# Patient Record
Sex: Female | Born: 1960
Health system: Southern US, Community
[De-identification: ages and names within clinical notes are randomized; demographics above are authoritative.]

## PROBLEM LIST (undated history)

## (undated) DIAGNOSIS — I5022 Chronic systolic (congestive) heart failure: Secondary | ICD-10-CM

## (undated) DIAGNOSIS — I472 Ventricular tachycardia, unspecified: Secondary | ICD-10-CM

## (undated) DIAGNOSIS — E119 Type 2 diabetes mellitus without complications: Secondary | ICD-10-CM

## (undated) DIAGNOSIS — I513 Intracardiac thrombosis, not elsewhere classified: Secondary | ICD-10-CM

## (undated) DIAGNOSIS — J449 Chronic obstructive pulmonary disease, unspecified: Secondary | ICD-10-CM

## (undated) DIAGNOSIS — Z91199 Patient's noncompliance with other medical treatment and regimen due to unspecified reason: Secondary | ICD-10-CM

## (undated) DIAGNOSIS — I272 Pulmonary hypertension, unspecified: Secondary | ICD-10-CM

## (undated) DIAGNOSIS — I2510 Atherosclerotic heart disease of native coronary artery without angina pectoris: Secondary | ICD-10-CM

## (undated) DIAGNOSIS — Z95810 Presence of automatic (implantable) cardiac defibrillator: Secondary | ICD-10-CM

## (undated) DIAGNOSIS — G4730 Sleep apnea, unspecified: Secondary | ICD-10-CM

## (undated) DIAGNOSIS — N182 Chronic kidney disease, stage 2 (mild): Secondary | ICD-10-CM

## (undated) DIAGNOSIS — R110 Nausea: Secondary | ICD-10-CM

## (undated) DIAGNOSIS — Z9119 Patient's noncompliance with other medical treatment and regimen: Secondary | ICD-10-CM

## (undated) DIAGNOSIS — F419 Anxiety disorder, unspecified: Secondary | ICD-10-CM

## (undated) DIAGNOSIS — I219 Acute myocardial infarction, unspecified: Secondary | ICD-10-CM

## (undated) DIAGNOSIS — I749 Embolism and thrombosis of unspecified artery: Secondary | ICD-10-CM

## (undated) DIAGNOSIS — Z720 Tobacco use: Secondary | ICD-10-CM

## (undated) DIAGNOSIS — I82409 Acute embolism and thrombosis of unspecified deep veins of unspecified lower extremity: Secondary | ICD-10-CM

## (undated) DIAGNOSIS — K219 Gastro-esophageal reflux disease without esophagitis: Secondary | ICD-10-CM

## (undated) DIAGNOSIS — E785 Hyperlipidemia, unspecified: Secondary | ICD-10-CM

## (undated) DIAGNOSIS — F1010 Alcohol abuse, uncomplicated: Secondary | ICD-10-CM

## (undated) DIAGNOSIS — R42 Dizziness and giddiness: Secondary | ICD-10-CM

## (undated) DIAGNOSIS — J9600 Acute respiratory failure, unspecified whether with hypoxia or hypercapnia: Secondary | ICD-10-CM

## (undated) HISTORY — DX: Atherosclerotic heart disease of native coronary artery without angina pectoris: I25.10

## (undated) HISTORY — DX: Alcohol abuse, uncomplicated: F10.10

## (undated) HISTORY — DX: Presence of automatic (implantable) cardiac defibrillator: Z95.810

## (undated) HISTORY — DX: Patient's noncompliance with other medical treatment and regimen: Z91.19

## (undated) HISTORY — DX: Dizziness and giddiness: R42

## (undated) HISTORY — PX: INSERT / REPLACE / REMOVE PACEMAKER: SUR710

## (undated) HISTORY — PX: DIAGNOSTIC LAPAROSCOPY: SUR761

## (undated) HISTORY — PX: CARDIAC CATHETERIZATION: SHX172

## (undated) HISTORY — DX: Type 2 diabetes mellitus without complications: E11.9

## (undated) HISTORY — PX: TUBAL LIGATION: SHX77

## (undated) HISTORY — DX: Patient's noncompliance with other medical treatment and regimen due to unspecified reason: Z91.199

## (undated) HISTORY — DX: Embolism and thrombosis of unspecified artery: I74.9

## (undated) HISTORY — DX: Ventricular tachycardia, unspecified: I47.20

## (undated) HISTORY — DX: Acute embolism and thrombosis of unspecified deep veins of unspecified lower extremity: I82.409

## (undated) HISTORY — DX: Hyperlipidemia, unspecified: E78.5

## (undated) HISTORY — DX: Tobacco use: Z72.0

## (undated) HISTORY — DX: Ventricular tachycardia: I47.2

## (undated) HISTORY — DX: Chronic systolic (congestive) heart failure: I50.22

---

## 1997-07-15 ENCOUNTER — Other Ambulatory Visit: Admission: RE | Admit: 1997-07-15 | Discharge: 1997-07-15 | Payer: Self-pay | Admitting: Internal Medicine

## 1998-05-31 ENCOUNTER — Encounter: Payer: Self-pay | Admitting: Emergency Medicine

## 1998-05-31 ENCOUNTER — Emergency Department (HOSPITAL_COMMUNITY): Admission: EM | Admit: 1998-05-31 | Discharge: 1998-05-31 | Payer: Self-pay | Admitting: Emergency Medicine

## 2001-05-12 ENCOUNTER — Emergency Department (HOSPITAL_COMMUNITY): Admission: EM | Admit: 2001-05-12 | Discharge: 2001-05-12 | Payer: Self-pay | Admitting: Emergency Medicine

## 2001-05-12 ENCOUNTER — Encounter: Payer: Self-pay | Admitting: Emergency Medicine

## 2001-09-25 ENCOUNTER — Ambulatory Visit (HOSPITAL_COMMUNITY): Admission: RE | Admit: 2001-09-25 | Discharge: 2001-09-26 | Payer: Self-pay | Admitting: Cardiovascular Disease

## 2001-09-25 ENCOUNTER — Encounter: Payer: Self-pay | Admitting: Cardiovascular Disease

## 2002-03-16 ENCOUNTER — Emergency Department (HOSPITAL_COMMUNITY): Admission: EM | Admit: 2002-03-16 | Discharge: 2002-03-17 | Payer: Self-pay | Admitting: Emergency Medicine

## 2002-03-16 ENCOUNTER — Emergency Department (HOSPITAL_COMMUNITY): Admission: EM | Admit: 2002-03-16 | Discharge: 2002-03-16 | Payer: Self-pay | Admitting: Emergency Medicine

## 2002-03-17 ENCOUNTER — Encounter: Payer: Self-pay | Admitting: Emergency Medicine

## 2002-11-23 ENCOUNTER — Encounter: Payer: Self-pay | Admitting: Emergency Medicine

## 2002-11-24 ENCOUNTER — Encounter: Payer: Self-pay | Admitting: Cardiovascular Disease

## 2002-11-24 ENCOUNTER — Inpatient Hospital Stay (HOSPITAL_COMMUNITY): Admission: EM | Admit: 2002-11-24 | Discharge: 2002-11-26 | Payer: Self-pay | Admitting: Emergency Medicine

## 2003-07-10 ENCOUNTER — Encounter: Payer: Self-pay | Admitting: Emergency Medicine

## 2003-07-10 ENCOUNTER — Inpatient Hospital Stay (HOSPITAL_COMMUNITY): Admission: AD | Admit: 2003-07-10 | Discharge: 2003-07-13 | Payer: Self-pay | Admitting: Cardiology

## 2003-12-03 ENCOUNTER — Observation Stay (HOSPITAL_COMMUNITY): Admission: EM | Admit: 2003-12-03 | Discharge: 2003-12-04 | Payer: Self-pay | Admitting: Emergency Medicine

## 2004-10-16 ENCOUNTER — Ambulatory Visit (HOSPITAL_COMMUNITY): Admission: RE | Admit: 2004-10-16 | Discharge: 2004-10-16 | Payer: Self-pay | Admitting: Obstetrics and Gynecology

## 2004-10-25 ENCOUNTER — Encounter: Admission: RE | Admit: 2004-10-25 | Discharge: 2004-10-25 | Payer: Self-pay | Admitting: Obstetrics and Gynecology

## 2005-05-02 ENCOUNTER — Inpatient Hospital Stay (HOSPITAL_COMMUNITY): Admission: EM | Admit: 2005-05-02 | Discharge: 2005-05-05 | Payer: Self-pay | Admitting: Emergency Medicine

## 2005-05-16 ENCOUNTER — Inpatient Hospital Stay (HOSPITAL_COMMUNITY): Admission: RE | Admit: 2005-05-16 | Discharge: 2005-05-18 | Payer: Self-pay | Admitting: Cardiovascular Disease

## 2005-10-01 ENCOUNTER — Encounter (HOSPITAL_COMMUNITY): Admission: RE | Admit: 2005-10-01 | Discharge: 2005-11-21 | Payer: Self-pay | Admitting: Cardiology

## 2006-01-09 ENCOUNTER — Inpatient Hospital Stay (HOSPITAL_COMMUNITY): Admission: RE | Admit: 2006-01-09 | Discharge: 2006-01-12 | Payer: Self-pay | Admitting: Cardiology

## 2006-01-09 ENCOUNTER — Encounter: Payer: Self-pay | Admitting: Emergency Medicine

## 2006-11-25 ENCOUNTER — Emergency Department (HOSPITAL_COMMUNITY): Admission: EM | Admit: 2006-11-25 | Discharge: 2006-11-25 | Payer: Self-pay | Admitting: *Deleted

## 2007-04-25 ENCOUNTER — Ambulatory Visit: Payer: Self-pay | Admitting: Internal Medicine

## 2007-04-25 ENCOUNTER — Inpatient Hospital Stay (HOSPITAL_COMMUNITY): Admission: EM | Admit: 2007-04-25 | Discharge: 2007-05-06 | Payer: Self-pay | Admitting: Emergency Medicine

## 2007-04-28 ENCOUNTER — Encounter (INDEPENDENT_AMBULATORY_CARE_PROVIDER_SITE_OTHER): Payer: Self-pay | Admitting: Cardiology

## 2007-05-15 ENCOUNTER — Ambulatory Visit: Payer: Self-pay | Admitting: Internal Medicine

## 2007-05-15 ENCOUNTER — Ambulatory Visit: Payer: Self-pay | Admitting: Cardiology

## 2007-05-15 LAB — CONVERTED CEMR LAB
BUN: 12 mg/dL (ref 6–23)
GFR calc Af Amer: 98 mL/min
GFR calc non Af Amer: 81 mL/min
Pro B Natriuretic peptide (BNP): 970 pg/mL — ABNORMAL HIGH (ref 0.0–100.0)
Sodium: 139 meq/L (ref 135–145)

## 2007-05-27 ENCOUNTER — Ambulatory Visit: Payer: Self-pay | Admitting: Cardiology

## 2007-05-27 LAB — CONVERTED CEMR LAB
BUN: 6 mg/dL (ref 6–23)
Calcium: 8.9 mg/dL (ref 8.4–10.5)
Chloride: 105 meq/L (ref 96–112)
Creatinine, Ser: 0.8 mg/dL (ref 0.4–1.2)
GFR calc Af Amer: 98 mL/min
Pro B Natriuretic peptide (BNP): 1064 pg/mL — ABNORMAL HIGH (ref 0.0–100.0)
Sodium: 140 meq/L (ref 135–145)

## 2007-06-04 ENCOUNTER — Ambulatory Visit: Payer: Self-pay | Admitting: Cardiology

## 2007-06-17 ENCOUNTER — Ambulatory Visit: Payer: Self-pay | Admitting: Cardiology

## 2007-06-17 LAB — CONVERTED CEMR LAB
CO2: 29 meq/L (ref 19–32)
Glucose, Bld: 113 mg/dL — ABNORMAL HIGH (ref 70–99)
Potassium: 3.8 meq/L (ref 3.5–5.1)
Sodium: 138 meq/L (ref 135–145)

## 2007-06-23 ENCOUNTER — Ambulatory Visit (HOSPITAL_COMMUNITY): Admission: RE | Admit: 2007-06-23 | Discharge: 2007-06-23 | Payer: Self-pay | Admitting: Cardiology

## 2007-07-08 ENCOUNTER — Ambulatory Visit: Payer: Self-pay | Admitting: Cardiology

## 2007-07-08 LAB — CONVERTED CEMR LAB
CO2: 27 meq/L (ref 19–32)
Creatinine, Ser: 0.7 mg/dL (ref 0.4–1.2)
GFR calc Af Amer: 115 mL/min
Pro B Natriuretic peptide (BNP): 927 pg/mL — ABNORMAL HIGH (ref 0.0–100.0)

## 2007-07-16 ENCOUNTER — Ambulatory Visit: Payer: Self-pay | Admitting: Internal Medicine

## 2007-07-16 LAB — CONVERTED CEMR LAB
BUN: 9 mg/dL (ref 6–23)
CO2: 30 meq/L (ref 19–32)
Calcium: 9.5 mg/dL (ref 8.4–10.5)
Chloride: 104 meq/L (ref 96–112)
GFR calc Af Amer: 98 mL/min
GFR calc non Af Amer: 81 mL/min
Pro B Natriuretic peptide (BNP): 849 pg/mL — ABNORMAL HIGH (ref 0.0–100.0)
Sodium: 141 meq/L (ref 135–145)

## 2007-07-22 ENCOUNTER — Ambulatory Visit: Payer: Self-pay | Admitting: Cardiovascular Disease

## 2007-07-24 ENCOUNTER — Ambulatory Visit: Payer: Self-pay | Admitting: Internal Medicine

## 2007-07-24 LAB — CONVERTED CEMR LAB
Chloride: 101 meq/L (ref 96–112)
Creatinine, Ser: 0.8 mg/dL (ref 0.4–1.2)
Potassium: 3.7 meq/L (ref 3.5–5.1)
Pro B Natriuretic peptide (BNP): 794 pg/mL — ABNORMAL HIGH (ref 0.0–100.0)

## 2007-07-31 ENCOUNTER — Ambulatory Visit: Payer: Self-pay | Admitting: Cardiology

## 2007-08-18 ENCOUNTER — Ambulatory Visit: Payer: Self-pay | Admitting: Cardiology

## 2007-08-18 ENCOUNTER — Inpatient Hospital Stay (HOSPITAL_COMMUNITY): Admission: AD | Admit: 2007-08-18 | Discharge: 2007-08-20 | Payer: Self-pay | Admitting: Internal Medicine

## 2007-08-27 ENCOUNTER — Ambulatory Visit: Payer: Self-pay | Admitting: Internal Medicine

## 2007-08-27 LAB — CONVERTED CEMR LAB
CO2: 28 meq/L (ref 19–32)
Calcium: 9.5 mg/dL (ref 8.4–10.5)
Chloride: 104 meq/L (ref 96–112)
GFR calc Af Amer: 98 mL/min
GFR calc non Af Amer: 81 mL/min
Glucose, Bld: 79 mg/dL (ref 70–99)

## 2007-10-01 ENCOUNTER — Ambulatory Visit: Payer: Self-pay | Admitting: Internal Medicine

## 2007-10-01 LAB — CONVERTED CEMR LAB
BUN: 12 mg/dL (ref 6–23)
Calcium: 9.4 mg/dL (ref 8.4–10.5)
Chloride: 106 meq/L (ref 96–112)
Glucose, Bld: 109 mg/dL — ABNORMAL HIGH (ref 70–99)
Pro B Natriuretic peptide (BNP): 363 pg/mL — ABNORMAL HIGH (ref 0.0–100.0)
Sodium: 141 meq/L (ref 135–145)

## 2007-10-14 ENCOUNTER — Ambulatory Visit: Payer: Self-pay | Admitting: Cardiology

## 2007-10-23 ENCOUNTER — Ambulatory Visit: Payer: Self-pay | Admitting: Internal Medicine

## 2007-10-23 ENCOUNTER — Ambulatory Visit (HOSPITAL_COMMUNITY): Admission: RE | Admit: 2007-10-23 | Discharge: 2007-10-23 | Payer: Self-pay | Admitting: Internal Medicine

## 2007-11-28 ENCOUNTER — Ambulatory Visit: Payer: Self-pay | Admitting: Internal Medicine

## 2007-11-28 LAB — CONVERTED CEMR LAB
BUN: 13 mg/dL (ref 6–23)
GFR calc non Af Amer: 71 mL/min
Sodium: 141 meq/L (ref 135–145)

## 2007-12-10 ENCOUNTER — Ambulatory Visit: Payer: Self-pay

## 2007-12-10 ENCOUNTER — Encounter: Payer: Self-pay | Admitting: Internal Medicine

## 2007-12-11 ENCOUNTER — Ambulatory Visit: Payer: Self-pay | Admitting: Cardiology

## 2007-12-17 ENCOUNTER — Ambulatory Visit: Payer: Self-pay | Admitting: Internal Medicine

## 2007-12-17 ENCOUNTER — Ambulatory Visit: Payer: Self-pay | Admitting: Cardiology

## 2007-12-17 LAB — CONVERTED CEMR LAB
BUN: 11 mg/dL (ref 6–23)
CO2: 30 meq/L (ref 19–32)
Calcium: 8.8 mg/dL (ref 8.4–10.5)
Potassium: 3.8 meq/L (ref 3.5–5.1)
Pro B Natriuretic peptide (BNP): 300 pg/mL — ABNORMAL HIGH (ref 0.0–100.0)
Sodium: 141 meq/L (ref 135–145)

## 2008-01-27 ENCOUNTER — Ambulatory Visit: Payer: Self-pay | Admitting: Internal Medicine

## 2008-01-27 LAB — CONVERTED CEMR LAB
CO2: 28 meq/L (ref 19–32)
Calcium: 9.1 mg/dL (ref 8.4–10.5)
Chloride: 107 meq/L (ref 96–112)
GFR calc non Af Amer: 81 mL/min
Potassium: 4 meq/L (ref 3.5–5.1)
Sodium: 141 meq/L (ref 135–145)

## 2008-02-04 ENCOUNTER — Ambulatory Visit: Payer: Self-pay | Admitting: Internal Medicine

## 2008-02-11 ENCOUNTER — Ambulatory Visit: Payer: Self-pay | Admitting: Internal Medicine

## 2008-02-11 LAB — CONVERTED CEMR LAB
BUN: 18 mg/dL (ref 6–23)
Basophils Relative: 0.5 % (ref 0.0–3.0)
CO2: 29 meq/L (ref 19–32)
Creatinine, Ser: 1 mg/dL (ref 0.4–1.2)
Eosinophils Absolute: 0.1 10*3/uL (ref 0.0–0.7)
GFR calc Af Amer: 76 mL/min
Glucose, Bld: 150 mg/dL — ABNORMAL HIGH (ref 70–99)
Hemoglobin: 15.1 g/dL — ABNORMAL HIGH (ref 12.0–15.0)
INR: 1 (ref 0.8–1.0)
MCHC: 34.4 g/dL (ref 30.0–36.0)
Monocytes Absolute: 0.6 10*3/uL (ref 0.1–1.0)
Neutro Abs: 3.1 10*3/uL (ref 1.4–7.7)
Neutrophils Relative %: 52.7 % (ref 43.0–77.0)
Potassium: 4.2 meq/L (ref 3.5–5.1)
Sodium: 139 meq/L (ref 135–145)
WBC: 5.9 10*3/uL (ref 4.5–10.5)

## 2008-02-17 ENCOUNTER — Observation Stay (HOSPITAL_COMMUNITY): Admission: AD | Admit: 2008-02-17 | Discharge: 2008-02-18 | Payer: Self-pay | Admitting: Internal Medicine

## 2008-02-17 ENCOUNTER — Ambulatory Visit: Payer: Self-pay | Admitting: Internal Medicine

## 2008-02-24 ENCOUNTER — Ambulatory Visit: Payer: Self-pay | Admitting: Internal Medicine

## 2008-03-19 ENCOUNTER — Ambulatory Visit: Payer: Self-pay | Admitting: Cardiology

## 2008-03-19 LAB — CONVERTED CEMR LAB
Basophils Relative: 0.7 % (ref 0.0–3.0)
Chloride: 103 meq/L (ref 96–112)
Eosinophils Relative: 1.9 % (ref 0.0–5.0)
GFR calc Af Amer: 98 mL/min
GFR calc non Af Amer: 81 mL/min
Glucose, Bld: 139 mg/dL — ABNORMAL HIGH (ref 70–99)
Lymphocytes Relative: 31.9 % (ref 12.0–46.0)
Monocytes Absolute: 0.5 10*3/uL (ref 0.1–1.0)
Monocytes Relative: 7.3 % (ref 3.0–12.0)
Pro B Natriuretic peptide (BNP): 175 pg/mL — ABNORMAL HIGH (ref 0.0–100.0)
RBC: 4.81 M/uL (ref 3.87–5.11)
RDW: 13.5 % (ref 11.5–14.6)
Sodium: 138 meq/L (ref 135–145)

## 2008-03-21 DIAGNOSIS — Z95810 Presence of automatic (implantable) cardiac defibrillator: Secondary | ICD-10-CM

## 2008-03-21 DIAGNOSIS — R031 Nonspecific low blood-pressure reading: Secondary | ICD-10-CM | POA: Insufficient documentation

## 2008-03-21 DIAGNOSIS — R609 Edema, unspecified: Secondary | ICD-10-CM

## 2008-03-21 DIAGNOSIS — R0602 Shortness of breath: Secondary | ICD-10-CM

## 2008-05-10 ENCOUNTER — Encounter (INDEPENDENT_AMBULATORY_CARE_PROVIDER_SITE_OTHER): Payer: Self-pay | Admitting: *Deleted

## 2008-05-17 ENCOUNTER — Encounter: Payer: Self-pay | Admitting: Internal Medicine

## 2008-05-25 ENCOUNTER — Encounter: Payer: Self-pay | Admitting: Internal Medicine

## 2008-05-25 ENCOUNTER — Ambulatory Visit: Payer: Self-pay | Admitting: Internal Medicine

## 2008-07-15 ENCOUNTER — Ambulatory Visit: Payer: Self-pay | Admitting: Cardiology

## 2008-07-15 DIAGNOSIS — E7800 Pure hypercholesterolemia, unspecified: Secondary | ICD-10-CM | POA: Insufficient documentation

## 2008-07-15 DIAGNOSIS — F17200 Nicotine dependence, unspecified, uncomplicated: Secondary | ICD-10-CM

## 2008-07-15 LAB — CONVERTED CEMR LAB
ALT: 19 units/L (ref 0–35)
BUN: 13 mg/dL (ref 6–23)
Bilirubin, Direct: 0.1 mg/dL (ref 0.0–0.3)
Calcium: 9.7 mg/dL (ref 8.4–10.5)
Chloride: 97 meq/L (ref 96–112)
Potassium: 3.7 meq/L (ref 3.5–5.1)
Pro B Natriuretic peptide (BNP): 110 pg/mL — ABNORMAL HIGH (ref 0.0–100.0)
Sodium: 137 meq/L (ref 135–145)
TSH: 1.5 microintl units/mL (ref 0.35–5.50)
Total Protein: 8.4 g/dL — ABNORMAL HIGH (ref 6.0–8.3)

## 2008-07-20 ENCOUNTER — Telehealth: Payer: Self-pay | Admitting: Internal Medicine

## 2008-08-12 ENCOUNTER — Ambulatory Visit: Payer: Self-pay | Admitting: Internal Medicine

## 2008-10-01 ENCOUNTER — Telehealth: Payer: Self-pay | Admitting: Internal Medicine

## 2008-11-22 ENCOUNTER — Ambulatory Visit: Payer: Self-pay | Admitting: Internal Medicine

## 2008-12-09 ENCOUNTER — Ambulatory Visit: Payer: Self-pay | Admitting: Internal Medicine

## 2008-12-09 LAB — CONVERTED CEMR LAB
BUN: 10 mg/dL (ref 6–23)
Calcium: 9.3 mg/dL (ref 8.4–10.5)
GFR calc non Af Amer: 113.78 mL/min (ref >60)
Glucose, Bld: 260 mg/dL — ABNORMAL HIGH (ref 70–99)
Potassium: 3.1 meq/L — ABNORMAL LOW (ref 3.5–5.1)

## 2008-12-23 ENCOUNTER — Telehealth: Payer: Self-pay | Admitting: Internal Medicine

## 2008-12-24 ENCOUNTER — Ambulatory Visit: Payer: Self-pay | Admitting: Internal Medicine

## 2008-12-24 DIAGNOSIS — E876 Hypokalemia: Secondary | ICD-10-CM

## 2008-12-27 LAB — CONVERTED CEMR LAB
BUN: 8 mg/dL (ref 6–23)
CO2: 32 meq/L (ref 19–32)
Calcium: 9 mg/dL (ref 8.4–10.5)
Creatinine, Ser: 0.7 mg/dL (ref 0.4–1.2)

## 2008-12-29 ENCOUNTER — Ambulatory Visit: Payer: Self-pay | Admitting: Internal Medicine

## 2009-01-18 ENCOUNTER — Ambulatory Visit: Payer: Self-pay | Admitting: Cardiology

## 2009-01-18 LAB — CONVERTED CEMR LAB
Calcium: 9 mg/dL (ref 8.4–10.5)
Chloride: 99 meq/L (ref 96–112)
Creatinine, Ser: 1 mg/dL (ref 0.4–1.2)
GFR calc non Af Amer: 75.36 mL/min (ref >60)
Pro B Natriuretic peptide (BNP): 150 pg/mL — ABNORMAL HIGH (ref 0.0–100.0)

## 2009-01-20 ENCOUNTER — Encounter (INDEPENDENT_AMBULATORY_CARE_PROVIDER_SITE_OTHER): Payer: Self-pay | Admitting: *Deleted

## 2009-03-25 ENCOUNTER — Encounter (INDEPENDENT_AMBULATORY_CARE_PROVIDER_SITE_OTHER): Payer: Self-pay | Admitting: *Deleted

## 2009-04-01 ENCOUNTER — Ambulatory Visit: Payer: Self-pay | Admitting: Internal Medicine

## 2009-04-21 ENCOUNTER — Ambulatory Visit: Payer: Self-pay | Admitting: Internal Medicine

## 2009-05-17 ENCOUNTER — Ambulatory Visit: Payer: Self-pay | Admitting: Internal Medicine

## 2009-05-18 ENCOUNTER — Encounter: Payer: Self-pay | Admitting: Internal Medicine

## 2009-05-20 ENCOUNTER — Telehealth: Payer: Self-pay | Admitting: Internal Medicine

## 2009-06-23 ENCOUNTER — Ambulatory Visit: Payer: Self-pay | Admitting: Internal Medicine

## 2009-07-01 ENCOUNTER — Telehealth: Payer: Self-pay | Admitting: Nurse Practitioner

## 2009-07-03 ENCOUNTER — Encounter: Payer: Self-pay | Admitting: Internal Medicine

## 2009-07-04 ENCOUNTER — Ambulatory Visit: Payer: Self-pay | Admitting: Internal Medicine

## 2009-07-13 ENCOUNTER — Encounter: Payer: Self-pay | Admitting: Internal Medicine

## 2009-08-25 ENCOUNTER — Ambulatory Visit: Payer: Self-pay | Admitting: Internal Medicine

## 2009-09-01 ENCOUNTER — Telehealth (INDEPENDENT_AMBULATORY_CARE_PROVIDER_SITE_OTHER): Payer: Self-pay

## 2009-09-05 ENCOUNTER — Encounter: Payer: Self-pay | Admitting: Cardiology

## 2009-09-05 ENCOUNTER — Encounter (HOSPITAL_COMMUNITY): Admission: RE | Admit: 2009-09-05 | Discharge: 2009-11-16 | Payer: Self-pay | Admitting: Internal Medicine

## 2009-09-05 ENCOUNTER — Ambulatory Visit: Payer: Self-pay

## 2009-09-05 ENCOUNTER — Ambulatory Visit: Payer: Self-pay | Admitting: Cardiology

## 2009-10-24 ENCOUNTER — Ambulatory Visit: Payer: Self-pay | Admitting: Internal Medicine

## 2009-10-27 LAB — CONVERTED CEMR LAB
CO2: 28 meq/L (ref 19–32)
Calcium: 9 mg/dL (ref 8.4–10.5)
Creatinine, Ser: 0.7 mg/dL (ref 0.4–1.2)
GFR calc non Af Amer: 115.28 mL/min (ref >60)
Pro B Natriuretic peptide (BNP): 157.4 pg/mL — ABNORMAL HIGH (ref 0.0–100.0)
Sodium: 140 meq/L (ref 135–145)

## 2009-11-25 ENCOUNTER — Encounter: Payer: Self-pay | Admitting: Internal Medicine

## 2009-11-25 ENCOUNTER — Ambulatory Visit: Payer: Self-pay | Admitting: Internal Medicine

## 2010-01-11 ENCOUNTER — Ambulatory Visit: Payer: Self-pay | Admitting: Internal Medicine

## 2010-01-11 ENCOUNTER — Encounter: Payer: Self-pay | Admitting: Internal Medicine

## 2010-01-12 ENCOUNTER — Telehealth: Payer: Self-pay | Admitting: Internal Medicine

## 2010-01-16 ENCOUNTER — Telehealth: Payer: Self-pay | Admitting: Internal Medicine

## 2010-02-13 ENCOUNTER — Emergency Department (HOSPITAL_COMMUNITY)
Admission: EM | Admit: 2010-02-13 | Discharge: 2010-02-13 | Payer: Self-pay | Source: Home / Self Care | Admitting: Emergency Medicine

## 2010-03-20 ENCOUNTER — Encounter: Payer: Self-pay | Admitting: Internal Medicine

## 2010-03-21 ENCOUNTER — Ambulatory Visit: Admit: 2010-03-21 | Payer: Self-pay | Admitting: Internal Medicine

## 2010-03-21 ENCOUNTER — Encounter: Payer: Self-pay | Admitting: Internal Medicine

## 2010-03-21 ENCOUNTER — Ambulatory Visit
Admission: RE | Admit: 2010-03-21 | Discharge: 2010-03-21 | Payer: Self-pay | Source: Home / Self Care | Attending: Internal Medicine | Admitting: Internal Medicine

## 2010-03-21 DIAGNOSIS — J40 Bronchitis, not specified as acute or chronic: Secondary | ICD-10-CM | POA: Insufficient documentation

## 2010-04-04 ENCOUNTER — Ambulatory Visit
Admission: RE | Admit: 2010-04-04 | Discharge: 2010-04-04 | Payer: Self-pay | Source: Home / Self Care | Attending: Internal Medicine | Admitting: Internal Medicine

## 2010-04-09 LAB — CONVERTED CEMR LAB
BUN: 7 mg/dL (ref 6–23)
CO2: 31 meq/L (ref 19–32)
Calcium: 8.9 mg/dL (ref 8.4–10.5)
Calcium: 9 mg/dL (ref 8.4–10.5)
Calcium: 9.4 mg/dL (ref 8.4–10.5)
Creatinine, Ser: 1 mg/dL (ref 0.4–1.2)
GFR calc non Af Amer: 85.12 mL/min (ref >60)
GFR calc non Af Amer: 97.55 mL/min (ref >60)
Potassium: 3.4 meq/L — ABNORMAL LOW (ref 3.5–5.1)
Potassium: 3.6 meq/L (ref 3.5–5.1)
Potassium: 3.7 meq/L (ref 3.5–5.1)
Pro B Natriuretic peptide (BNP): 108 pg/mL — ABNORMAL HIGH (ref 0.0–100.0)
Pro B Natriuretic peptide (BNP): 382 pg/mL — ABNORMAL HIGH (ref 0.0–100.0)
Pro B Natriuretic peptide (BNP): 79 pg/mL (ref 0.0–100.0)
Sodium: 140 meq/L (ref 135–145)

## 2010-04-11 NOTE — Progress Notes (Signed)
Summary: Nuc. Pre-Procedure  Phone Note Outgoing Call Call back at [facility] Phone [phone]   Call placed by: Irean Hong, RN,  September 01, 2009 3:11 PM Summary of Call: Reviewed information on My[employer] Information Sheet (see scanned document for further details).  Spoke with patient.     Nuclear Med Background Indications for Stress Test: Evaluation for Ischemia, Stent Patency, PTCA Patency   History: Angioplasty, Defibrillator, Echo, Heart Catheterization, Stents  History Comments: Hx. Multiple Stents in past. 10/07 Cath: DX 80%> PTCA DX; residual RCA 100% attempted PCI but unsuccessful. '09 AICD: ICM. '09 Echo: EF=20-25%. Hx. Severe CHF secondary to ICM, Pulmonary HTN.  Symptoms: Chest Pain, Chest Tightness with Exertion, Fatigue, Nausea    Nuclear Pre-Procedure Cardiac Risk Factors: Lipids, NIDDM, Smoker Height (in): 63  Nuclear Med Study Referring MD:  Gala Romney

## 2010-04-11 NOTE — Progress Notes (Signed)
Summary: plz call pt when all meds are called in  Phone Note Refill Request Call back at Home Phone [phone] Message from:  Patient on [facility] [address]  Refills Requested: Medication #1:  METOPROLOL SUCCINATE 50 MG XR24H-TAB Take one tablet by mouth daily  Medication #2:  PREVACID 24HR 15 MG CPDR Take 1 tablet by mouth once a day.  Medication #3:  GLIMEPIRIDE 2 MG TABS 1 by mouth once daily  Medication #4:  SPIRONOLACTONE 25 MG TABS Take one half  tablet by mouth daily/ out ZAROXOLYN 2.5 MG TABS Take 1 tablet, FUROSEMIDE 80 MG TABS Take one tablet by mouth two times a day please call pt when meds are called in  Initial call taken by: Omer Jack,  January 12, 2010 11:06 AM  Follow-up for Phone Call        spoke with pt she is aware Follow-up by: Hardin Negus, RMA,  January 13, 2010 12:49 PM    Prescriptions: FUROSEMIDE 80 MG TABS (FUROSEMIDE) Take one tablet by mouth two times a day  #60 x 11   Entered by:   Hardin Negus, RMA   Authorized by:   Dolores Patty, MD, [facility]   Signed by:   Hardin Negus, RMA on 01/13/2010   Method used:   Electronically to        [facility] [phone]* (retail)       [address]       [place], [place]  [address]       Ph: [phone]       Fax: [phone]   RxID:   [id number] SPIRONOLACTONE 25 MG TABS (SPIRONOLACTONE) Take one half  tablet by mouth daily/ out  #15 x 11   Entered by:   Hardin Negus, RMA   Authorized by:   Dolores Patty, MD, [facility]   Signed by:   Hardin Negus, RMA on 01/13/2010   Method used:   Electronically to        [facility] [phone]* (retail)       [address]       [place], [place]  [address]       Ph: [phone]       Fax: [phone]   RxID:   [id number] METOPROLOL SUCCINATE 50 MG XR24H-TAB (METOPROLOL SUCCINATE) Take one tablet by mouth daily  #30 x 11   Entered by:   Hardin Negus, RMA   Authorized by:   Dolores Patty, MD, [facility]   Signed by:   Hardin Negus, RMA on 01/13/2010   Method used:   Electronically to        [facility] [phone]* (retail)       [address]       [place], [place]  [address]       Ph: [phone]       Fax: [phone]   RxID:   [id number] ZAROXOLYN 2.5 MG TABS (METOLAZONE) Take 1 tablet by mouth once a day as needed  #30 Each x 5   Entered by:   Hardin Negus, RMA   Authorized by:   Dolores Patty, MD, [facility]   Signed by:   Hardin Negus, RMA on 01/13/2010   Method used:   Electronically to        [facility] [phone]* (retail)       [address]       [place], [place]  [address]  Ph: [phone]       Fax: [phone]   RxID:   [id number] GLIMEPIRIDE 2 MG TABS (GLIMEPIRIDE) 1 by mouth once daily  #30 x 6   Entered by:   Hardin Negus, RMA   Authorized by:   Dolores Patty, MD, [facility]   Signed by:   Hardin Negus, RMA on 01/13/2010   Method used:   Electronically to        [facility] [phone]* (retail)       [address]       [place], [place]  [address]       Ph: [phone]       Fax: [phone]   RxID:   [id number]

## 2010-04-11 NOTE — Assessment & Plan Note (Signed)
Summary: 1 month rov/sl   Visit Type:  1 months follow up Referring Provider:  [name] Primary Provider:  [name]  CC:  Pt. takes Diovan once a day. According to last visit she is suppose to take bid.  History of Present Illness: Jessica [name] is a 50 year old woman with a history of severe congestive heart failure, secondary ischemic cardiomyopathy with an ejection fraction of 25-30%. She also has a history of coronary artery disease, status post multiple interventions and stents, diabetes, hyperlipidemia, history of ongoing tobacco and possibly alcohol use.  She has been followed in the heart failure clinic by [name] and has had a problem with noncompliance.  She returns today for routine followup.   We saw her a month ago and was out all of her medicines. We resumed her medicines and she did well for a while. However, over this weekend began swelling again after drinking lots of water. Says her throat feels dry and sore. Feels bloated. Weight up 3 pounds. No ankle edmea. + chronic dyspnea. No CP. Has not taken extra lasix. Still smoking and trying to quit with Zyban but feels it wears off midday and is wondering if sha can take bid. Denies ETOH.  Current Medications (verified): 1)  Furosemide 80 Mg Tabs (Furosemide) .... Take One Tablet By Mouth Two Times A Day 2)  Diovan 80 Mg Tabs (Valsartan) .... Take One Tablet Once A Day 3)  Aspirin 81 Mg Tbec (Aspirin) .... Take One Tablet By Mouth Daily 4)  Glimepiride 2 Mg Tabs (Glimepiride) .[name].. 1 By Mouth Once Daily 5)  Digoxin 0.25 Mg Tabs (Digoxin) .[name].. 1 By Mouth Daily 6)  Potassium Chloride Crys Cr 20 Meq Cr-Tabs (Potassium Chloride Crys Cr) .... Two Times A Day 7)  Crestor 40 Mg Tabs (Rosuvastatin Calcium) .[name].. 1 By Mouth Daily 8)  Zaroxolyn 2.5 Mg Tabs (Metolazone) .... Take 1 Tablet By Mouth Once A Day As Needed 9)  Zyban 150 Mg Xr12h-Tab (Bupropion Hcl (Smoking Deter)) .... Take 1 Tablet By Mouth Once A Day 10)   Metoprolol Succinate 25 Mg Xr24h-Tab (Metoprolol Succinate) .[name].. 1 By Mouth Daily  Allergies: 1)  ! * Altace 2)  ! * Adhesive  Past History:  Past Medical History: Last updated: 05/07/2008  1. Congestive heart failure secondary to severe ischemic       cardiomyopathy with EF of 20-25% status post recent implantation of       a Biotronik single-chamber ICD by Dr. Lewayne Bunting in December       2009.        -- RHC 2/09:  RA 18, PA 56/30 (40), PCWP 36. FICK 3.89/2.06. PVR 3.5 Woods   2. Coronary artery disease status post multiple PCI.   3. Ongoing tobacco and EtOH abuse.   4. History of medical noncompliance.   5. Type 2 diabetes.   6. Dizziness  7. Noncompliance  Review of Systems       As per HPI and past medical history; otherwise all systems negative.   Vital Signs:  Patient profile:   50 year old female Height:      63 inches Weight:      185.50 pounds BMI:     32.98 Pulse rate:   72 / minute Resp:     18 per minute BP sitting:   98 / 70  Vitals Entered By: Vikki Ports (May 17, 2009 12:03 PM)  Physical Exam  General:  Fatigued  no resp difficulty HEENT: normal  Neck: supple. JVP 7. Carotids 2+ bilat; no bruits. No lymphadenopathy or thryomegaly appreciated. Cor: PMI nondisplaced. Regular  rhythm. . No rubs, murmur. no s3 Lungs: clear Abdomen: obese. soft, nontender, nondistended. No hepatosplenomegaly. No bruits or masses. Good bowel sounds. Extremities: no cyanosis, clubbing, rash, edema Neuro: alert & orientedx3, cranial nerves grossly intact. moves all 4 extremities w/o difficulty. affect pleasant     ICD Specifications Following MD:  Lewayne Bunting, MD     Referring MD:  [facility] ICD Vendor:  Biotronik     ICD Model Number:  540     ICD Serial Number:  [id number] ICD DOI:  02/17/2008     ICD Implanting MD:  Lewayne Bunting, MD  Lead 1:    Location: RV     DOI: 02/17/2008     Model #: [id number]     Serial #: [id number]     Status: active  Indications::   NICM   ICD Follow Up ICD Dependent:  No      Episodes Coumadin:  No  Brady Parameters Mode VVI     Lower Rate Limit:  45      Tachy Zones VF:  188     Impression & Recommendations:  Problem # 1:  SYSTOLIC HEART FAILURE, CHRONIC (ICD-428.22) Mild volume overload with chronic NYHA Class III sympmtoms. Non-compliance continues to be a major issue. REsume spironlactone 12.5 once daily. Increase Diovan to prescribed dose of 80 two times a day. Can use zaroxolyn as needed. Reviewed use of sliding scale diuretic and need to be compliant. Check labs next week.   Problem # 2:  CAD, NATIVE VESSEL (ICD-414.01) Stable. No evidence of ischemia. Continue current regimen.  Problem # 3:  NICOTINE ADDICTION (ICD-305.1) Counseled on need to quit completely. Told her it was OK to increase ZYban to 150 two times a day as she was requesting.  Other Orders: Misc. Referral (Misc. Ref)  Patient Instructions: 1)  Your physician recommends that you schedule a follow-up appointment in: 41month 2)  Your physician recommends that you return for lab work on Tuesday--March 15--BMET,BNP- 428.22 3)  Your physician has recommended you make the following change in your medication: Increase Diovan to 80 mg by mouth two times a day. 4)  Start spironolactone 25 mg one half tablet by mouth daily. 5)  Take zaroxolyn 2.5 mg by mouth daily as needed. 6)  You have been referred to [facility]

## 2010-04-11 NOTE — Assessment & Plan Note (Signed)
Summary: PC2   Visit Type:  Follow-up Referring Provider:  [name] Primary Provider:  [name]   History of Present Illness: Jessica [name] is a 50 year old woman with a history of severe congestive heart failure, secondary ischemic cardiomyopathy with an ejection fraction of 25-30%. She also has a history of coronary artery disease, status post multiple interventions and stents, diabetes, hyperlipidemia, history of ongoing tobacco and possibly alcohol use.  She recently started on Zyban and states she thinks it is helping her nicotine craving.  She returns today for routine followup.   Her CHF has improved after uptitration of her cardiac meds.  She has not had any intercurrent ICD therapies or palpitations.    Current Medications (verified): 1)  Furosemide 80 Mg Tabs (Furosemide) .... Take One Tablet By Mouth Two Times A Day 2)  Diovan 80 Mg Tabs (Valsartan) .... Take 1 Tablet By Mouth Two Times A Day 3)  Aspirin 81 Mg Tbec (Aspirin) .... Take One Tablet By Mouth Daily 4)  Plavix 75 Mg Tabs (Clopidogrel Bisulfate) .... Take One Tablet By Mouth Daily/ Out 5)  Glimepiride 2 Mg Tabs (Glimepiride) .[name].. 1 By Mouth Once Daily 6)  Digoxin 0.25 Mg Tabs (Digoxin) .[name].. 1 By Mouth Daily 7)  Potassium Chloride Crys Cr 20 Meq Cr-Tabs (Potassium Chloride Crys Cr) .... Two Times A Day 8)  Crestor 40 Mg Tabs (Rosuvastatin Calcium) .[name].. 1 By Mouth Daily 9)  Zaroxolyn 2.5 Mg Tabs (Metolazone) .... Take 1 Tablet By Mouth Once A Day As Needed 10)  Zyban 150 Mg Xr12h-Tab (Bupropion Hcl (Smoking Deter)) .... Take 1 Tablet By Mouth Once A Day 11)  Metoprolol Succinate 25 Mg Xr24h-Tab (Metoprolol Succinate) .[name].. 1 By Mouth Daily  Allergies: 1)  ! * Altace 2)  ! * Adhesive  Past History:  Past Medical History: Last updated: 05/07/2008  1. Congestive heart failure secondary to severe ischemic       cardiomyopathy with EF of 20-25% status post recent implantation of       a Biotronik  single-chamber ICD by Dr. Lewayne Bunting in December       2009.        -- RHC 2/09:  RA 18, PA 56/30 (40), PCWP 36. FICK 3.89/2.06. PVR 3.5 Woods   2. Coronary artery disease status post multiple PCI.   3. Ongoing tobacco and EtOH abuse.   4. History of medical noncompliance.   5. Type 2 diabetes.   6. Dizziness  7. Noncompliance  Review of Systems       The patient complains of dyspnea on exertion.  The patient denies chest pain, syncope, peripheral edema, and prolonged cough.    Vital Signs:  Patient profile:   50 year old female Height:      63 inches Weight:      184 pounds BMI:     32.71 Pulse rate:   89 / minute BP sitting:   122 / 78  (left arm)  Vitals Entered By: Laurance Flatten CMA (April 01, 2009 3:09 PM)  Physical Exam  General:  Gen: well appearing. no resp difficulty HEENT: normal Neck: supple. JVP 8. Carotids 2+ bilat; nobruits. No lymphadenopathy or thryomegaly appreciated. Cor: PMI nondisplaced. Regular  rhythm. Tachycardic. No rubs, murmur. no s3 Lungs: clear except for basilar rales. Chest: ICD well healed. Abdomen: obeses. soft, nontender, nondistended. No hepatosplenomegaly. No bruits or masses. Good bowel sounds. Extremities: no cyanosis, clubbing, rash, edema Neuro: alert & orientedx3, cranial nerves grossly intact. moves all  4 extremities w/o difficulty. affect pleasant     ICD Specifications Following MD:  Lewayne Bunting, MD     Referring MD:  [facility] ICD Vendor:  Biotronik     ICD Model Number:  540     ICD Serial Number:  [id number] ICD DOI:  02/17/2008     ICD Implanting MD:  Lewayne Bunting, MD  Lead 1:    Location: RV     DOI: 02/17/2008     Model #: [id number]     Serial #: [id number]     Status: active  Indications::  NICM   ICD Follow Up Remote Check?  No Battery Voltage:  3.12 V     Charge Time:  10.6 seconds     ICD Dependent:  No       ICD Device Measurements Right Ventricle:  Amplitude: 27.9 mV, Impedance: 658 ohms, Threshold: 0.5 V at 0.4  msec Shock Impedance: 51 ohms   Episodes Coumadin:  No Shock:  0     ATP:  0     Nonsustained:  0     Ventricular Pacing:  0%  Brady Parameters Mode VVI     Lower Rate Limit:  45      Tachy Zones VF:  188     Next Cardiology Appt Due:  06/10/2009 Tech Comments:  No parameter changes.  Checked by [employer].  Follow as needed for Galaxy study. Altha Harm, LPN  April 01, 2009 3:39 PM  MD Comments:  Agree with above.  Impression & Recommendations:  Problem # 1:  ICD - IN SITU (ICD-V45.02) Her device is working normally today.  Will recheck in several months.  Problem # 2:  SYSTOLIC HEART FAILURE, CHRONIC (ICD-428.22) Her symptoms appear to be class 2.  A low sodium diet is recommended. Continue her current meds. The following medications were removed from the medication list:    Spironolactone 25 Mg Tabs (Spironolactone) .[name]... Take one tablet by mouth daily Her updated medication list for this problem includes:    Furosemide 80 Mg Tabs (Furosemide) .[name]... Take one tablet by mouth two times a day    Diovan 80 Mg Tabs (Valsartan) .[name]... Take 1 tablet by mouth two times a day    Aspirin 81 Mg Tbec (Aspirin) .[name]... Take one tablet by mouth daily    Plavix 75 Mg Tabs (Clopidogrel bisulfate) .[name]... Take one tablet by mouth daily/ out    Digoxin 0.25 Mg Tabs (Digoxin) .[name]... 1 by mouth daily    Zaroxolyn 2.5 Mg Tabs (Metolazone) .[name]... Take 1 tablet by mouth once a day as needed    Metoprolol Succinate 25 Mg Xr24h-tab (Metoprolol succinate) .[name]... 1 by mouth daily  Problem # 3:  CAD, NATIVE VESSEL (ICD-414.01) She denies anginal symptoms.  Will continue her current meds.  I have asked her to continue to stop smoking . Her updated medication list for this problem includes:    Aspirin 81 Mg Tbec (Aspirin) .[name]... Take one tablet by mouth daily    Plavix 75 Mg Tabs (Clopidogrel bisulfate) .[name]... Take one tablet by mouth daily/ out    Metoprolol Succinate 25 Mg Xr24h-tab (Metoprolol succinate) .[name]...  1 by mouth daily  Patient Instructions: 1)  Your physician recommends that you schedule a follow-up appointment in: 12 months with Dr Ladona Ridgel

## 2010-04-11 NOTE — Assessment & Plan Note (Signed)
Summary: Cardiology Nuclear Study  Nuclear Med Background Indications for Stress Test: Evaluation for Ischemia, Stent Patency, PTCA Patency   History: Angioplasty, Defibrillator, Echo, Heart Catheterization, Stents  History Comments: Hx. Multiple Stents in past. 10/07 Cath: DX 80%> PTCA DX; residual RCA 100% attempted PCI but unsuccessful. '09 AICD: ICM. '09 Echo: EF=20-25%. Hx. Severe CHF secondary to ICM, Pulmonary HTN.  Symptoms: Chest Pain, Chest Tightness, Chest Tightness with Exertion, Fatigue, Nausea, Palpitations, SOB    Nuclear Pre-Procedure Cardiac Risk Factors: Lipids, NIDDM, Smoker Caffeine/Decaff Intake: None NPO After: 10:30 PM Lungs: clear IV 0.9% NS with Angio Cath: 20g     IV Site: (R) AC IV Started by: Stanton Kidney EMT-P Chest Size (in) 38     Cup Size D     Height (in): 63 Weight (lb): 180 BMI: 32.00  Nuclear Med Study 1 or 2 day study:  1 day     Stress Test Type:  Eugenie Birks Reading MD:  [name], MD     Referring MD:  Arvilla Meres Resting Radionuclide:  Technetium 1m Tetrofosmin     Resting Radionuclide Dose:  11 mCi  Stress Radionuclide:  Technetium 32m Tetrofosmin     Stress Radionuclide Dose:  33 mCi   Stress Protocol   Lexiscan: 0.4 mg   Stress Test Technologist:  Milana Na EMT-P     Nuclear Technologist:  Domenic Polite CNMT  Rest Procedure  Myocardial perfusion imaging was performed at rest 45 minutes following the intravenous administration of Myoview Technetium 12m Tetrofosmin.  Stress Procedure  The patient received IV Lexiscan 0.4 mg over 15-seconds.  Myoview injected at 30-seconds.  There were no significant changes and occ pvcs with infusion.  Quantitative spect images were obtained after a 45 minute delay.  QPS Raw Data Images:  Normal; no motion artifact; normal heart/lung ratio. Stress Images:  Severe lateral wall, basal to mid inferior wall, and basal to mid anterior wall perfusion defect.  Rest Images:  No change  from stress.  Subtraction (SDS):  Fixed large lateral, anterior, and inferior perfusion defect.  Transient Ischemic Dilatation:  1.03  (Normal <1.22)  Lung/Heart Ratio:  .31  (Normal <0.45)  Quantitative Gated Spect Images QGS EDV:  197 ml QGS ESV:  148 ml QGS EF:  25 % QGS cine images:  Severe systolic dysfunction with relative preservation of septal wall motion.    Overall Impression  Exercise Capacity: Lexiscan study BP Response: Normal blood pressure response. Clinical Symptoms: Chest tightness ECG Impression: No significant ST segment change suggestive of ischemia. Overall Impression: Large infarction involving the basal to mid anterior wall, the entire lateral wall, and the basal to mid inferior wall.  Overall Impression Comments: Severe LV systolic dysfunction.   Appended Document: Cardiology Nuclear Study no ischemia. continue medical therapy. if CP getting worse will need cath.  Appended Document: Cardiology Nuclear Study pt aware, she states symptoms are some better, will let us know if gets worse

## 2010-04-11 NOTE — Procedures (Signed)
Summary: Cardiology Device Clinic   Current Medications (verified): 1)  Furosemide 80 Mg Tabs (Furosemide) .... Take One Tablet By Mouth Two Times A Day 2)  Diovan 160 Mg Tabs (Valsartan) .... Take One Tablet By Mouth Two Times A Day 3)  Aspirin 81 Mg Tbec (Aspirin) .... Take One Tablet By Mouth Daily 4)  Glimepiride 2 Mg Tabs (Glimepiride) .[name].. 1 By Mouth Once Daily 5)  Potassium Chloride Crys Cr 20 Meq Cr-Tabs (Potassium Chloride Crys Cr) .... Two Times A Day 6)  Crestor 40 Mg Tabs (Rosuvastatin Calcium) .[name].. 1 By Mouth Daily 7)  Zaroxolyn 2.5 Mg Tabs (Metolazone) .... Take 1 Tablet By Mouth Once A Day As Needed 8)  Zyban 150 Mg Xr12h-Tab (Bupropion Hcl (Smoking Deter)) .... Take 1 Tablet By Mouth Two Times A Day 9)  Metoprolol Succinate 50 Mg Xr24h-Tab (Metoprolol Succinate) .... Take One Tablet By Mouth Daily 10)  Spironolactone 25 Mg Tabs (Spironolactone) .... Take One Half  Tablet By Mouth Daily/ Out 11)  Imdur 30 Mg Xr24h-Tab (Isosorbide Mononitrate) .... Take One Tablet By Mouth Daily/ Pt Has Not Started On Med Yet. 12)  Prevacid 24hr 15 Mg Cpdr (Lansoprazole) .... Take 1 Tablet By Mouth Once A Day  Allergies (verified): 1)  ! * Altace 2)  ! * Adhesive   ICD Specifications Following MD:  Lewayne Bunting, MD     Referring MD:  [facility] ICD Vendor:  [employer]ik     ICD Model Number:  540     ICD Serial Number:  [id number] ICD DOI:  02/17/2008     ICD Implanting MD:  Lewayne Bunting, MD  Lead 1:    Location: RV     DOI: 02/17/2008     Model #: [id number]     Serial #: [id number]     Status: active  Indications::  NICM   ICD Follow Up Battery Voltage:  3.12 V     Charge Time:  10.9 seconds     Underlying rhythm:  SR @ 92 ICD Dependent:  No       ICD Device Measurements Right Ventricle:  Amplitude: 22.7 mV, Impedance: 536 ohms, Threshold: 0.5 V at 0.5 msec Shock Impedance: 48 ohms   Episodes MS Episodes:  0     Coumadin:  No Shock:  0     ATP:  0     Nonsustained:  0     Atrial  Therapies:  0 Ventricular Pacing:  0%  Brady Parameters Mode VVI     Lower Rate Limit:  45      Tachy Zones VF:  188     Next Cardiology Appt Due:  03/13/2010 Tech Comments:  RESEARCH PT (checked by [facility] DEVICE FUNCTION.  NO EPISODES SINCE LAST CHECK.  NO CHANGES MADE. ROV IN JAN W/GT. Vella Kohler  January 11, 2010 1:03 PM

## 2010-04-11 NOTE — Letter (Signed)
Summary: Generic Letter  Architectural technologist, Main Office  [address]   [place], [place] [address]   Phone: [phone]  Fax: [phone]        November 25, 2009 MRN: [id number]    Jessica [name] [address] [place], [place]  [address]   To Whom It May Concern,   Patient has a Research officer, political party and should not walk through [employer].  This can cause device malfunction.  Please feel free to call our office for further concerns or questions [phone].    Sincerely,     Dr. Lewayne Bunting, MD,FACC/Kelly Darl Pikes, RN, BSN  This letter has been electronically signed by your physician.

## 2010-04-11 NOTE — Assessment & Plan Note (Signed)
Summary:  f45m   Visit Type:  Follow-up Referring Provider:  [name] Primary Provider:  [name]   History of Present Illness: Jessica [name] is a 50 year old woman with a history of severe congestive heart failure, secondary ischemic cardiomyopathy with an ejection fraction of 25-30%. She also has a history of coronary artery disease, status post multiple interventions and stents, diabetes, hyperlipidemia, history of ongoing tobacco and possibly alcohol use.  She has been followed in the heart failure clinic and has had a problem with noncompliance.   Inolerant of ACE-I due to severe cough.  Had stress test in 09/2009 for CP.  EF:  25 %. LV markedly dilated. 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 No ischemia.  Here for routine f/u. From cardiac standpoint feels better. Breathing better. Weight down. Very sleepy at times. Snores heavily and people tell her she stops breathing. Hasn't used CPAP in 2 years. Edema better. Occasional CP but very mild. Still smokes an occasional cigarette but way down with Zyban.  Current Medications (verified): 1)  Furosemide 80 Mg Tabs (Furosemide) .... Take One Tablet By Mouth Two Times A Day 2)  Diovan 160 Mg Tabs (Valsartan) .... Take One Tablet By Mouth Two Times A Day 3)  Aspirin 81 Mg Tbec (Aspirin) .... Take One Tablet By Mouth Daily 4)  Glimepiride 2 Mg Tabs (Glimepiride) .[name].. 1 By Mouth Once Daily 5)  Potassium Chloride Crys Cr 20 Meq Cr-Tabs (Potassium Chloride Crys Cr) .... Two Times A Day 6)  Crestor 40 Mg Tabs (Rosuvastatin Calcium) .[name].. 1 By Mouth Daily 7)  Zaroxolyn 2.5 Mg Tabs (Metolazone) .... Take 1 Tablet By Mouth Once A Day As Needed 8)  Zyban 150 Mg Xr12h-Tab (Bupropion Hcl (Smoking Deter)) .... Take 1 Tablet By Mouth Two Times A Day 9)  Metoprolol Succinate 50 Mg Xr24h-Tab (Metoprolol Succinate) .... Take One Tablet By Mouth Daily 10)  Spironolactone 25  Mg Tabs (Spironolactone) .... Take One Half  Tablet By Mouth Daily/ Out 11)  Imdur 30 Mg Xr24h-Tab (Isosorbide Mononitrate) .... Take One Tablet By Mouth Daily/ Pt Has Not Started On Med Yet.  Allergies (verified): 1)  ! * Altace 2)  ! * Adhesive  Past History:  Past Medical History: Last updated: 05/07/2008  1. Congestive heart failure secondary to severe ischemic       cardiomyopathy with EF of 20-25% status post recent implantation of       a Biotronik single-chamber ICD by Dr. Lewayne Bunting in December       2009.        -- R[facility] 2/09:  RA 18, PA 56/30 (40), PCWP 36. FICK 3.89/2.06. PVR 3.5 Woods   2. Coronary artery disease status post multiple PCI.   3. Ongoing tobacco and EtOH abuse.   4. History of medical noncompliance.   5. Type 2 diabetes.   6. Dizziness  7. Noncompliance  Review of Systems       As per HPI and past medical history; otherwise all systems negative.   Vital Signs:  Patient profile:   50 year old female Height:      63 inches Weight:      179 pounds O2 Sat:      97 % Pulse rate:   72 / minute BP sitting:   94 / 70  (left arm)  Vitals Entered By: Laurance Flatten CMA (November 25, 2009 12:12 PM)  Physical Exam  General:  Ambulates with no resp difficulty HEENT: normal Neck: supple. JVP flat. Carotids 2+ bilat; no bruits. Cor: PMI nondisplaced. Regular  rhythm. . No rubs, murmur. no s3 Lungs: clear Abdomen: obese. soft, nontender, nondistended.  No bruits or masses. Good bowel sounds. Extremities: no cyanosis, clubbing, rash, edema Neuro: alert & orientedx3, cranial nerves grossly intact. moves all 4 extremities w/o difficulty. affect pleasant     ICD Specifications Following MD:  Lewayne Bunting, MD     Referring MD:  [facility] ICD Vendor:  [employer]iotronik     ICD Model Number:  540     ICD Serial Number:  [id number] ICD DOI:  02/17/2008     ICD Implanting MD:  Lewayne Bunting, MD  Lead 1:    Location: RV     DOI: 02/17/2008     Model #: [id number]     Serial  #: [id number]     Status: active  Indications::  NICM   ICD Follow Up ICD Dependent:  No      Episodes Coumadin:  No  Brady Parameters Mode VVI     Lower Rate Limit:  45      Tachy Zones VF:  188     Impression & Recommendations:  Problem # 1:  SYSTOLIC HEART FAILURE, CHRONIC (ICD-428.22) Volume status looks good. Stable  NYHA Class II-III. Continue current regimen.   Problem # 2:  CAD, NATIVE VESSEL (ICD-414.01) Stable. No evidence of ischemia. Continue current regimen.  Problem # 3:  NICOTINE ADDICTION (ICD-305.1) Discussed need for complete smoking cessation  Problem # 4:  FATIGUE Likely due to OSA. Resume CPAP.   Other Orders: EKG w/ Interpretation (93000) EKG w/ Interpretation (93000)  Patient Instructions: 1)  Your physician recommends that you schedule a follow-up appointment in: 6 weeks. 2)  Your physician recommends that you continue on your current medications as directed. Please refer to the Current Medication list given to you today. 3)  Wear your C-PAP.  EKG ORDERED IN ERROR.

## 2010-04-11 NOTE — Assessment & Plan Note (Signed)
Summary: 2 month rov.sl   Visit Type:  Follow-up Referring Jessica [name]:  Jessica [name] Primary Jessica [name]:  Jessica [name]  CC:  shortness of breath.  History of Present Illness: Jessica [name] is a 50 year old woman with a history of severe congestive heart failure, secondary ischemic cardiomyopathy with an ejection fraction of 25-30%. She also has a history of coronary artery disease, status post multiple interventions and stents, diabetes, hyperlipidemia, history of ongoing tobacco and possibly alcohol use.  She has been followed in the heart failure clinic and has had a problem with noncompliance.   Inolerant of ACE-I due to severe cough.  Had stress test in 09/2009 for CP.  EF:  25 %. LV markedly dilated. 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 No ischemia.  Here for routine f/u. CP much improved but feels more SOB and like she is swelling in her belly. At last visit we wrote for Imdur but she never filled. Says she has been compliant with all medicine. Taking metolazone 2x/week.   Still smoking 1/2 ppd.   Current Medications (verified): 1)  Furosemide 80 Mg Tabs (Furosemide) .... Take One Tablet By Mouth Two Times A Day 2)  Diovan 80 Mg Tabs (Valsartan) .... Take One Tablet Twice A Day 3)  Aspirin 81 Mg Tbec (Aspirin) .... Take One Tablet By Mouth Daily 4)  Glimepiride 2 Mg Tabs (Glimepiride) .[name].. 1 By Mouth Once Daily 5)  Potassium Chloride Crys Cr 20 Meq Cr-Tabs (Potassium Chloride Crys Cr) .... Two Times A Day 6)  Crestor 40 Mg Tabs (Rosuvastatin Calcium) .[name].. 1 By Mouth Daily 7)  Zaroxolyn 2.5 Mg Tabs (Metolazone) .... Take 1 Tablet By Mouth Once A Day As Needed 8)  Zyban 150 Mg Xr12h-Tab (Bupropion Hcl (Smoking Deter)) .... Take 1 Tablet By Mouth Two Times A Day 9)  Metoprolol Succinate 50 Mg Xr24h-Tab (Metoprolol Succinate) .... Take One Tablet By Mouth Daily 10)  Spironolactone 25 Mg Tabs (Spironolactone)  .... Take One Half  Tablet By Mouth Daily 11)  Imdur 30 Mg Xr24h-Tab (Isosorbide Mononitrate) .... Take One Tablet By Mouth Daily  Allergies (verified): 1)  ! * Altace 2)  ! * Adhesive  Review of Systems       As per HPI and past medical history; otherwise all systems negative.   Vital Signs:  Patient profile:   50 year old female Height:      63 inches Weight:      183 pounds BMI:     32.53 Pulse rate:   75 / minute BP sitting:   118 / 66  (left arm) Cuff size:   regular  Vitals Entered By: Jessica [name], RMA (October 24, 2009 11:31 AM)  Physical Exam  General:  Ambulates with no resp difficulty HEENT: normal Neck: supple. JVP 7-8. Carotids 2+ bilat; no bruits. Cor: PMI nondisplaced. Regular  rhythm. . No rubs, murmur. no s3 Lungs: clear Abdomen: obese. soft, nontender, nondistended.  No bruits or masses. Good bowel sounds. Extremities: no cyanosis, clubbing, rash, edema Neuro: alert & orientedx3, cranial nerves grossly intact. moves all 4 extremities w/o difficulty. affect pleasant     ICD Specifications Following MD:  Jessica Bunting, MD     Referring MD:  Jessica [name] ICD Vendor:  B[employer]ronik     ICD Model Number:  540     ICD Serial Number:  [id number] ICD DOI:  02/17/2008     ICD Implanting MD:  Jessica Bunting,  MD  Lead 1:    Location: RV     DOI: 02/17/2008     Model #: [id number]     Serial #: [id number]     Status: active  Indications::  NICM   ICD Follow Up ICD Dependent:  No      Episodes Coumadin:  No  Brady Parameters Mode VVI     Lower Rate Limit:  45      Tachy Zones VF:  188     Impression & Recommendations:  Problem # 1:  LONG TERM USE OF ASPIRIN (ICD-V58.66) Stable. No evidence of ischemia on recent Myoview. Continue current regimen.  Problem # 2:  SYSTOLIC HEART FAILURE, CHRONIC (ICD-428.22) Mild volume overload. Take metolazone for 2 days.  NYHA Class II-III. Will increase Diovan 160 two times a day. Check labs today.  Problem # 3:  NICOTINE  ADDICTION (ICD-305.1) Discussed need for complete smoking cessation  Other Orders: TLB-BMP (Basic Metabolic Panel-BMET) (80048-METABOL) TLB-BNP (B-Natriuretic Peptide) (83880-BNPR)  Patient Instructions: 1)  Increase 160mg  two times a day  2)  Labs today 3)  Follow up in 1 month--Friday Sept 16 at 12pm Prescriptions: DIOVAN 160 MG TABS (VALSARTAN) Take one tablet by mouth two times a day  #60 x 6   Entered by:   Jessica Staggers, RN   Authorized by:   Jessica Patty, MD, [facility]   Signed by:   Jessica Staggers, RN on 10/24/2009   Method used:   Electronically to        [facility] [phone]* (retail)       [address]       [place], [place]  [address]       Ph: [phone]       Fax: [phone]   RxID:   [id number]

## 2010-04-11 NOTE — Procedures (Signed)
Summary: Cardiology Device Clinic   Current Medications (verified): 1)  Furosemide 80 Mg Tabs (Furosemide) .... Take One Tablet By Mouth Two Times A Day 2)  Diovan 160 Mg Tabs (Valsartan) .... Take One Tablet By Mouth Two Times A Day 3)  Aspirin 81 Mg Tbec (Aspirin) .... Take One Tablet By Mouth Daily 4)  Glimepiride 2 Mg Tabs (Glimepiride) .[name].. 1 By Mouth Once Daily 5)  Potassium Chloride Crys Cr 20 Meq Cr-Tabs (Potassium Chloride Crys Cr) .... Two Times A Day 6)  Crestor 40 Mg Tabs (Rosuvastatin Calcium) .[name].. 1 By Mouth Daily 7)  Zaroxolyn 2.5 Mg Tabs (Metolazone) .... Take 1 Tablet By Mouth Once A Day As Needed 8)  Zyban 150 Mg Xr12h-Tab (Bupropion Hcl (Smoking Deter)) .... Take 1 Tablet By Mouth Two Times A Day 9)  Metoprolol Succinate 50 Mg Xr24h-Tab (Metoprolol Succinate) .... Take One Tablet By Mouth Daily 10)  Spironolactone 25 Mg Tabs (Spironolactone) .... Take One Half  Tablet By Mouth Daily/ Out 11)  Imdur 30 Mg Xr24h-Tab (Isosorbide Mononitrate) .... Take One Tablet By Mouth Daily/ Pt Has Not Started On Med Yet.  Allergies (verified): 1)  ! * Altace 2)  ! * Adhesive   ICD Specifications Following MD:  Lewayne Bunting, MD     Referring MD:  [facility] ICD Vendor:  Biotronik     ICD Model Number:  540     ICD Serial Number:  [id number] ICD DOI:  02/17/2008     ICD Implanting MD:  Lewayne Bunting, MD  Lead 1:    Location: RV     DOI: 02/17/2008     Model #: [id number]     Serial #: [id number]     Status: active  Indications::  NICM   ICD Follow Up Battery Voltage:  3.12 V     Charge Time:  10.9 seconds     Underlying rhythm:  SR @ 73 ICD Dependent:  No       ICD Device Measurements Right Ventricle:  Amplitude: 28.7 mV, Impedance: 530 ohms, Threshold: 0.50 V at 0.5 msec Shock Impedance: 42 ohms   Episodes MS Episodes:  0     Coumadin:  No Shock:  0     ATP:  0     Nonsustained:  0     Atrial Therapies:  0 Ventricular Pacing:  0%  Brady Parameters Mode VVI     Lower Rate Limit:   45      Tachy Zones VF:  188     Next Cardiology Appt Due:  03/06/2010 Tech Comments:  NORMAL DEVICE FUNCTION.  NO EPISODES SINCE LAST CHECK.  NO CHANGES MADE.  ROV IN 3 MTHS W/GT. Vella Kohler  November 25, 2009 12:40 PM

## 2010-04-11 NOTE — Progress Notes (Signed)
Summary: Pt refusing HH  Phone Note From Other Clinic   Caller: [facility] Summary of Call: Pt refusing Home health assistance as ordered by Dr. Gala Romney. Initial call taken by: Charlena Cross, RN, BSN,  May 20, 2009 11:47 AM

## 2010-04-11 NOTE — Assessment & Plan Note (Signed)
Summary: 1 month rov/sl   Visit Type:  Follow-up Referring Provider:  [name] Primary Provider:  [name]  CC:  SOB some.  History of Present Illness: Jessica [name] is a 50 year old woman with a history of severe congestive heart failure, secondary ischemic cardiomyopathy with an ejection fraction of 25-30%. She also has a history of coronary artery disease, status post multiple interventions and stents, diabetes, hyperlipidemia, history of ongoing tobacco and possibly alcohol use.  She has been followed in the heart failure clinic and has had a problem with noncompliance.  She returns today for routine followup.   Inolerant of ACE-I due to severe cough.  We saw her a month ago and she had mild volume overload. Diovan increased back to dose she was written for and we added back sprionolactone.   She has been out of several of her medicines again due to financial reasons. Going to pick them up today. Feels like she is swelling again. Mild dyspnea.  Feels tired. Able to do most activities. Weight stable. No CP. Still smoking but trying to cut down. Maybe 2 cigs/day.   Current Medications (verified): 1)  Furosemide 80 Mg Tabs (Furosemide) .... Take One Tablet By Mouth Two Times A Day 2)  Diovan 80 Mg Tabs (Valsartan) .... Take One Tablet Twice A Day 3)  Aspirin 81 Mg Tbec (Aspirin) .... Take One Tablet By Mouth Daily 4)  Glimepiride 2 Mg Tabs (Glimepiride) .[name].. 1 By Mouth Once Daily 5)  Digoxin 0.25 Mg Tabs (Digoxin) .[name].. 1 By Mouth Daily 6)  Potassium Chloride Crys Cr 20 Meq Cr-Tabs (Potassium Chloride Crys Cr) .... Two Times A Day 7)  Crestor 40 Mg Tabs (Rosuvastatin Calcium) .[name].. 1 By Mouth Daily 8)  Zaroxolyn 2.5 Mg Tabs (Metolazone) .... Take 1 Tablet By Mouth Once A Day As Needed 9)  Zyban 150 Mg Xr12h-Tab (Bupropion Hcl (Smoking Deter)) .... Take 1 Tablet By Mouth Once A Day 10)  Metoprolol Succinate 25 Mg Xr24h-Tab (Metoprolol Succinate) .[name].. 1 By Mouth Daily 11)   Spironolactone 25 Mg Tabs (Spironolactone) .... Take One Half  Tablet By Mouth Daily  Allergies (verified): 1)  ! * Altace 2)  ! * Adhesive   Past History:  Past Medical History: Last updated: 05/07/2008  1. Congestive heart failure secondary to severe ischemic       cardiomyopathy with EF of 20-25% status post recent implantation of       a Biotronik single-chamber ICD by Dr. Lewayne Bunting in December       2009.        -- RHC 2/09:  RA 18, PA 56/30 (40), PCWP 36. FICK 3.89/2.06. PVR 3.5 Woods   2. Coronary artery disease status post multiple PCI.   3. Ongoing tobacco and EtOH abuse.   4. History of medical noncompliance.   5. Type 2 diabetes.   6. Dizziness  7. Noncompliance  Review of Systems       As per HPI and past medical history; otherwise all systems negative.   Vital Signs:  Patient profile:   50 year old female Height:      63 inches Weight:      186 pounds BMI:     33.07 Pulse rate:   60 / minute BP sitting:   106 / 78  (left arm) Cuff size:   regular  Vitals Entered By: Hardin Negus, RMA (June 23, 2009 10:54 AM)  Physical Exam  General:  Fatigued  no resp difficulty HEENT: normal Neck:  supple. JVP 5. Carotids 2+ bilat; no bruits. Cor: PMI nondisplaced. Regular  rhythm. . No rubs, murmur. no s3 Lungs: clear Abdomen: obese. soft, nontender, nondistended.  No bruits or masses. Good bowel sounds. Extremities: no cyanosis, clubbing, rash, edema Neuro: alert & orientedx3, cranial nerves grossly intact. moves all 4 extremities w/o difficulty. affect pleasant     ICD Specifications Following MD:  Lewayne Bunting, MD     Referring MD:  [facility] ICD Vendor:  B[employer]ronik     ICD Model Number:  540     ICD Serial Number:  [id number] ICD DOI:  02/17/2008     ICD Implanting MD:  Lewayne Bunting, MD  Lead 1:    Location: RV     DOI: 02/17/2008     Model #: [id number]     Serial #: [id number]     Status: active  Indications::  NICM   ICD Follow Up ICD Dependent:  No       Episodes Coumadin:  No  Brady Parameters Mode VVI     Lower Rate Limit:  45      Tachy Zones VF:  188     Impression & Recommendations:  Problem # 1:  SYSTOLIC HEART FAILURE, CHRONIC (ICD-428.22) Assessment Unchanged Volume status doing pretty well despite not taking lasix for almost a week. Will restart her meds today. NYHA class II-III.  Problem # 2:  HYPERCHOLESTEROLEMIA (ICD-272.0) Switched from simva to Crestor previously due to LDL not at goal. However, Crestor is really making it hard for her to get all of her medicines. Her monthly medicine bill is over $120/month and $60 of thi sis from Crestor. we will try and see if we can give her samples. If not, will consider switching to simva at 40 or 80 daily although LDL will not be perfectly at goal. Lipitor also an option when it goes generic later this year. Check lipids at next visit.   Problem # 3:  CAD, NATIVE VESSEL (ICD-414.01) Stable. No evidence of ischemia. Continue current regimen.  Problem # 4:  NICOTINE ADDICTION (ICD-305.1) Discussed need for complete smoking cessation  Patient Instructions: 1)  Follow up in 2 months

## 2010-04-11 NOTE — Progress Notes (Signed)
Summary: calling regarding Prevacid  Phone Note Call from Patient Call back at Home Phone [phone]   Caller: Patient Summary of Call: Pt calling regarding medication Prevacid it's not working Initial call taken by: Judie Grieve,  January 16, 2010 1:03 PM  Follow-up for Phone Call        wants to change back to Prontix, will send in new rx Meredith Staggers, RN  January 16, 2010 2:01 PM     New/Updated Medications: PROTONIX 40 MG TBEC (PANTOPRAZOLE SODIUM) Take 1 tablet by mouth once a day Prescriptions: PROTONIX 40 MG TBEC (PANTOPRAZOLE SODIUM) Take 1 tablet by mouth once a day  #30 x 6   Entered by:   Meredith Staggers, RN   Authorized by:   Dolores Patty, MD, [facility]   Signed by:   Meredith Staggers, RN on 01/16/2010   Method used:   Electronically to        [facility] [phone]* (retail)       [address]       [place], [place]  [address]       Ph: [phone]       Fax: [phone]   RxID:   [id number]

## 2010-04-11 NOTE — Letter (Signed)
Summary: Appointment - Missed  [facility]     [place], [place]    Phone:   Fax:      March 25, 2009 MRN: [id number]   Jessica [name] [address] [place], [place]  [address]   Dear Ms. Yokum,  Our records indicate you missed your appointment on  03-15-2009  with  Dr. Gala Romney  It is very important that we reach you to reschedule this appointment. We look forward to participating in your health care needs. Please contact us at the number listed above at your earliest convenience to reschedule this appointment.     Sincerely,   Lorne Skeens  [facility] Scheduling Team

## 2010-04-11 NOTE — Letter (Signed)
Summary: Device-Delinquent Phone Journalist, newspaper, Main Office  [address]   [place], [place] [address]   Phone: [phone]  Fax: [phone]     Jul 13, 2009 MRN: [id number]   [facility] [address] [place], [place]  [address]   Dear Ms. Vanwinkle,  According to our records, you were scheduled for a device phone transmission on                              .     We did not receive any results from this check.  If you transmitted on your scheduled day, please call us to help troubleshoot your system.  If you forgot to send your transmission, please send one upon receipt of this letter.  Thank you,   Architectural technologist [facility]

## 2010-04-11 NOTE — Cardiovascular Report (Signed)
Summary: ICD Follow-Up Summary  ICD Follow-Up Summary   Imported By: Marylou Mccoy 01/30/2010 10:32:45  _____________________________________________________________________  External Attachment:    Type:   Image     Comment:   External Document

## 2010-04-11 NOTE — Assessment & Plan Note (Signed)
Summary: per check out/sf   Referring Provider:  [name] Primary Provider:  [name]  CC:  abdominal swelling / no energy and tired.  History of Present Illness: Jessica [name] is a 50 year old woman with a history of severe congestive heart failure, secondary ischemic cardiomyopathy with an ejection fraction of 25-30%. She also has a history of coronary artery disease, status post multiple interventions and stents, diabetes, hyperlipidemia, history of ongoing tobacco and possibly alcohol use.  She has been followed in the heart failure clinic by [name] and has had a problem with noncompliance.  She returns today for routine followup.   Over past few months has really been improving with increased energy tolerance. However over past 2 weeks feels very drained and belly is bloated. Weight is up 2 pounds ago. Out of all meds except lasix, potassium and spiro for last 2 weeks due to financial issues. No CP. No orthopnea or PND. No LE edema. Still smoking 4-5 cigs per day. Feels depressed.  Current Medications (verified): 1)  Furosemide 80 Mg Tabs (Furosemide) .... Take One Tablet By Mouth Two Times A Day 2)  Diovan 80 Mg Tabs (Valsartan) .... Take 1 Tablet By Mouth Two Times A Day 3)  Aspirin 81 Mg Tbec (Aspirin) .... Take One Tablet By Mouth Daily 4)  Plavix 75 Mg Tabs (Clopidogrel Bisulfate) .... Take One Tablet By Mouth Daily/ Out 5)  Glimepiride 2 Mg Tabs (Glimepiride) .[name].. 1 By Mouth Once Daily 6)  Digoxin 0.25 Mg Tabs (Digoxin) .[name].. 1 By Mouth Daily 7)  Potassium Chloride Crys Cr 20 Meq Cr-Tabs (Potassium Chloride Crys Cr) .... Two Times A Day 8)  Crestor 40 Mg Tabs (Rosuvastatin Calcium) .[name].. 1 By Mouth Daily 9)  Zaroxolyn 2.5 Mg Tabs (Metolazone) .... Take 1 Tablet By Mouth Once A Day As Needed 10)  Zyban 150 Mg Xr12h-Tab (Bupropion Hcl (Smoking Deter)) .... Take 1 Tablet By Mouth Once A Day 11)  Metoprolol Succinate 25 Mg Xr24h-Tab (Metoprolol Succinate) .[name].. 1 By Mouth  Daily  Allergies (verified): 1)  ! * Altace 2)  ! * Adhesive  Past History:  Past Medical History: Last updated: 05/07/2008  1. Congestive heart failure secondary to severe ischemic       cardiomyopathy with EF of 20-25% status post recent implantation of       a Biotronik single-chamber ICD by Dr. Lewayne Bunting in December       2009.        -- [facility] 2/09:  RA 18, PA 56/30 (40), PCWP 36. FICK 3.89/2.06. PVR 3.5 Woods   2. Coronary artery disease status post multiple PCI.   3. Ongoing tobacco and EtOH abuse.   4. History of medical noncompliance.   5. Type 2 diabetes.   6. Dizziness  7. Noncompliance  Review of Systems       As per HPI and past medical history; otherwise all systems negative.   Vital Signs:  Patient profile:   50 year old female Height:      63 inches Weight:      182 pounds BMI:     32.36 Pulse rate:   81 / minute BP sitting:   90 / 62  (left arm) Cuff size:   regular  Vitals Entered By: Hardin Negus, RMA (April 21, 2009 3:39 PM)  Physical Exam  General:  Fatigued  no resp difficulty HEENT: normal Neck: supple. JVP flat. Carotids 2+ bilat; nobruits. No lymphadenopathy or thryomegaly appreciated. Cor: PMI nondisplaced. Regular  rhythm. . No rubs, murmur. no s3 Lungs: clear Chest: ICD well healed. Abdomen: obess. soft, nontender, nondistended. No hepatosplenomegaly. No bruits or masses. Good bowel sounds. Extremities: no cyanosis, clubbing, rash, edema Neuro: alert & orientedx3, cranial nerves grossly intact. moves all 4 extremities w/o difficulty. affect pleasant     ICD Specifications Following MD:  Lewayne Bunting, MD     Referring MD:  [facility] ICD Vendor:  Biotronik     ICD Model Number:  540     ICD Serial Number:  [id number] ICD DOI:  02/17/2008     ICD Implanting MD:  Lewayne Bunting, MD  Lead 1:    Location: RV     DOI: 02/17/2008     Model #: [id number]     Serial #: [id number]     Status: active  Indications::  NICM   ICD Follow Up ICD  Dependent:  No      Episodes Coumadin:  No  Brady Parameters Mode VVI     Lower Rate Limit:  45      Tachy Zones VF:  188     Impression & Recommendations:  Problem # 1:  SYSTOLIC HEART FAILURE, CHRONIC (ICD-428.22) Now with worsening (NYHA III) symptoms in the face of medication non-compliance. Will resume her previous meds and have her f/u in 1 month. If continues to get worse knows to call me.   Problem # 2:  NICOTINE ADDICTION (ICD-305.1) Counseled on need to quit.  Problem # 3:  CAD, NATIVE VESSEL (ICD-414.01) Stable. No evidence of ischemia. Given costs issues will stop Plavix.  Other Orders: EKG w/ Interpretation (93000)  Patient Instructions: 1)  Follow up in 1 month

## 2010-04-11 NOTE — Cardiovascular Report (Signed)
Summary: Office Visit Remote   Office Visit Remote   Imported By: Roderic Ovens 07/25/2009 15:30:35  _____________________________________________________________________  External Attachment:    Type:   Image     Comment:   External Document

## 2010-04-11 NOTE — Assessment & Plan Note (Signed)
Summary: 6wk f/u sl   Visit Type:  6 wk f/u Referring Provider:  [name] Primary Provider:  [name]  CC:  chest discomfort states feels like acid reflux...sob....edema/abdomen.....  History of Present Illness: Jessica [name] is a 50 year old woman with a history of severe congestive heart failure, secondary ischemic cardiomyopathy with an ejection fraction of 25-30%. She also has a history of coronary artery disease, status post multiple interventions and stents, diabetes, hyperlipidemia, history of ongoing tobacco and possibly alcohol use.  She has been followed in the heart failure clinic and has had a problem with noncompliance.   Inolerant of ACE-I due to severe cough.  Had stress test in 09/2009 for CP.  EF:  25 %. LV markedly dilated. 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 No ischemia.  Here for routine f/u. Occasional CP. Usually late at night. Worse with meals. No relief from NTG. Feels like it is acid reflux. Previously on Protonix. Main complaint is abdominal swelling and bloating. Compliant with meds except ran out glimepride. Not following her sugars closely.    Still smokes an occasional cigarette but way down with Zyban.  Current Medications (verified): 1)  Furosemide 80 Mg Tabs (Furosemide) .... Take One Tablet By Mouth Two Times A Day 2)  Diovan 160 Mg Tabs (Valsartan) .... Take One Tablet By Mouth Two Times A Day 3)  Aspirin 81 Mg Tbec (Aspirin) .... Take One Tablet By Mouth Daily 4)  Glimepiride 2 Mg Tabs (Glimepiride) .[name].. 1 By Mouth Once Daily 5)  Potassium Chloride Crys Cr 20 Meq Cr-Tabs (Potassium Chloride Crys Cr) .... Two Times A Day 6)  Crestor 40 Mg Tabs (Rosuvastatin Calcium) .[name].. 1 By Mouth Daily 7)  Zaroxolyn 2.5 Mg Tabs (Metolazone) .... Take 1 Tablet By Mouth Once A Day As Needed 8)  Zyban 150 Mg Xr12h-Tab (Bupropion Hcl (Smoking Deter)) .... Take 1 Tablet By Mouth Two  Times A Day 9)  Metoprolol Succinate 50 Mg Xr24h-Tab (Metoprolol Succinate) .... Take One Tablet By Mouth Daily 10)  Spironolactone 25 Mg Tabs (Spironolactone) .... Pt States She Has Been Out of Med 11)  Imdur 30 Mg Xr24h-Tab (Isosorbide Mononitrate) .... Take One Tablet By Mouth Daily/ Pt Has Not Started On Med Yet.  Allergies: 1)  ! * Altace 2)  ! * Adhesive  Past History:  Past Medical History: Last updated: 05/07/2008  1. Congestive heart failure secondary to severe ischemic       cardiomyopathy with EF of 20-25% status post recent implantation of       a Biotronik single-chamber ICD by Dr. Lewayne Bunting in December       2009.        -- [facility] 2/09:  RA 18, PA 56/30 (40), PCWP 36. FICK 3.89/2.06. PVR 3.5 Woods   2. Coronary artery disease status post multiple PCI.   3. Ongoing tobacco and EtOH abuse.   4. History of medical noncompliance.   5. Type 2 diabetes.   6. Dizziness  7. Noncompliance  Review of Systems       As per HPI and past medical history; otherwise all systems negative.   Vital Signs:  Patient profile:   50 year old female Height:      63 inches Weight:      181.12 pounds BMI:     32.20 Pulse rate:   97 / minute Pulse rhythm:   irregular BP sitting:   90 /  70  (left arm) Cuff size:   regular  Vitals Entered By: Danielle Rankin, CMA (January 11, 2010 11:52 AM)  Physical Exam  General:  Ambulates with no resp difficulty HEENT: normal Neck: supple. JVP flat. Carotids 2+ bilat; no bruits. Cor: PMI nondisplaced. Regular  rhythm. . No rubs, murmur. no s3 Lungs: clear Abdomen: obese. soft, nontender, nondistended.  No bruits or masses. Good bowel sounds. Extremities: no cyanosis, clubbing, rash, edema Neuro: alert & orientedx3, cranial nerves grossly intact. moves all 4 extremities w/o difficulty. affect pleasant     ICD Specifications Following MD:  Lewayne Bunting, MD     Referring MD:  [facility] ICD Vendor:  Biotronik     ICD Model Number:  540     ICD  Serial Number:  [id number] ICD DOI:  02/17/2008     ICD Implanting MD:  Lewayne Bunting, MD  Lead 1:    Location: RV     DOI: 02/17/2008     Model #: [id number]     Serial #: [id number]     Status: active  Indications::  NICM   ICD Follow Up ICD Dependent:  No      Episodes Coumadin:  No  Brady Parameters Mode VVI     Lower Rate Limit:  45      Tachy Zones VF:  188     Impression & Recommendations:  Problem # 1:  SYSTOLIC HEART FAILURE, CHRONIC (ICD-428.22) Stable NYHA III. Mild volume overload. Will have her take metolazone today and once weekly.  Otherwise continue current regimen.   Problem # 2:  NICOTINE ADDICTION (ICD-305.1) Cutting back with Zyban. Discussed need for complete smoking cessation particularly as this limits our option for advanced therapies for her HF.   Problem # 3:  CAD, NATIVE VESSEL (ICD-414.01) Stable. No evidence of ischemia. Continue current regimen.  Patient Instructions: 1)  Take Metolazone today and once weekly 2)  Pravacid daily 3)  Follow up in 2-3 months Prescriptions: PREVACID 24HR 15 MG CPDR (LANSOPRAZOLE) Take 1 tablet by mouth once a day  #30 x 6   Entered by:   Meredith Staggers, RN   Authorized by:   Dolores Patty, MD, [facility]   Signed by:   Meredith Staggers, RN on 01/11/2010   Method used:   Electronically to        [facility] [phone]* (retail)       [address]       [place], [place]  [address]       Ph: [phone]       Fax: [phone]   RxID:   [id number]

## 2010-04-11 NOTE — Miscellaneous (Signed)
Summary: [facility]   Imported By: Roderic Ovens 06/16/2009 12:25:10  _____________________________________________________________________  External Attachment:    Type:   Image     Comment:   External Document

## 2010-04-11 NOTE — Progress Notes (Signed)
Summary: Cardiology Phone Note - Chest Pain  Phone Note Call from Patient   Summary of Call: Jessica [name] called in this evening stating that this am, after walking through the metal detector at court, she had a gradual onset of sharp c/p with mild dypnea and diaph.  she sat down and ems was called.  after applying o2 for a brief period, ss resolved.  she did not seek further medical attention.  she is now wondering if perhaps her aicd went off and that was what caused her ss.  i suggested that given the gradual onset of ss w/o an abrupt sensation of being shocked, that it sounded unlikely though there was no way to be sure at this point.  i advised that if it occurs again that she should come into the ed (she has not had any additonal c/p and is out of town at this time).  otw, she will call the office monday morning to arrange for device interrogation. Initial call taken by: Creig Hines, ANP-BC,  July 01, 2009 8:37 PM

## 2010-04-11 NOTE — Assessment & Plan Note (Signed)
Summary: per check out/sf   Visit Type:  Follow-up Referring Provider:  [name] Primary Provider:  [name]  CC:  Chest discomfort- .  History of Present Illness: Jessica [name] is a 50 year old woman with a history of severe congestive heart failure, secondary ischemic cardiomyopathy with an ejection fraction of 25-30%. She also has a history of coronary artery disease, status post multiple interventions and stents, diabetes, hyperlipidemia, history of ongoing tobacco and possibly alcohol use.  She has been followed in the heart failure clinic and has had a problem with noncompliance.   Inolerant of ACE-I due to severe cough.  We saw her in April and she was doing fairly well despite not taking lasix regularly. Returns for follow-up. In march went through a Printmaker at [employer] and developed severe CP and had severe diaphoresis. BP way up.  Doesn't think ICE fired.  Since that time has been getting mild CP about every other week. Gets a little tightness after she finishes walking or mopping. Not getting worse. If she takes digoixn every day she gets nauseated. Ran out of spiro. Taking lasix, Diovan and Toprol. Taking metolazone 2x/week.   Still smoking 1/2 ppd.   Current Medications (verified): 1)  Furosemide 80 Mg Tabs (Furosemide) .... Take One Tablet By Mouth Two Times A Day 2)  Diovan 80 Mg Tabs (Valsartan) .... Take One Tablet Twice A Day 3)  Aspirin 81 Mg Tbec (Aspirin) .... Take One Tablet By Mouth Daily 4)  Glimepiride 2 Mg Tabs (Glimepiride) .[name].. 1 By Mouth Once Daily 5)  Digoxin 0.25 Mg Tabs (Digoxin) .[name].. 1 By Mouth Daily 6)  Potassium Chloride Crys Cr 20 Meq Cr-Tabs (Potassium Chloride Crys Cr) .... Two Times A Day 7)  Crestor 40 Mg Tabs (Rosuvastatin Calcium) .[name].. 1 By Mouth Daily 8)  Zaroxolyn 2.5 Mg Tabs (Metolazone) .... Take 1 Tablet By Mouth Once A Day As Needed 9)  Zyban 150 Mg Xr12h-Tab (Bupropion Hcl (Smoking Deter)) .... Take 1 Tablet By Mouth Two  Times A Day 10)  Metoprolol Succinate 25 Mg Xr24h-Tab (Metoprolol Succinate) .[name].. 1 By Mouth Daily 11)  Spironolactone 25 Mg Tabs (Spironolactone) .... Take One Half  Tablet By Mouth Daily  Allergies: 1)  ! * Altace 2)  ! * Adhesive  Past History:  Past Medical History: Reviewed history from 05/07/2008 and no changes required.  1. Congestive heart failure secondary to severe ischemic       cardiomyopathy with EF of 20-25% status post recent implantation of       a Biotronik single-chamber ICD by Dr. Lewayne Bunting in December       2009.        -- [facility] 2/09:  RA 18, PA 56/30 (40), PCWP 36. FICK 3.89/2.06. PVR 3.5 Woods   2. Coronary artery disease status post multiple PCI.   3. Ongoing tobacco and EtOH abuse.   4. History of medical noncompliance.   5. Type 2 diabetes.   6. Dizziness  7. Noncompliance  Review of Systems       As per HPI and past medical history; otherwise all systems negative.   Vital Signs:  Patient profile:   50 year old female Height:      63 inches Weight:      179.50 pounds BMI:     31.91 Pulse rate:   80 / minute Pulse rhythm:   regular Resp:     18 per minute BP sitting:   110 / 78  (left arm) Cuff  size:   large  Vitals Entered By: Vikki Ports (August 25, 2009 11:11 AM)  Physical Exam  General:  Fatigued  no resp difficulty HEENT: normal Neck: supple. JVP 5. Carotids 2+ bilat; no bruits. Cor: PMI nondisplaced. Regular  rhythm. . No rubs, murmur. no s3 Lungs: clear Abdomen: obese. soft, nontender, nondistended.  No bruits or masses. Good bowel sounds. Extremities: no cyanosis, clubbing, rash, edema Neuro: alert & orientedx3, cranial nerves grossly intact. moves all 4 extremities w/o difficulty. affect pleasant     ICD Specifications Following MD:  Lewayne Bunting, MD     Referring MD:  [facility] ICD Vendor:  Biotronik     ICD Model Number:  540     ICD Serial Number:  [id number] ICD DOI:  02/17/2008     ICD Implanting MD:  Lewayne Bunting,  MD  Lead 1:    Location: RV     DOI: 02/17/2008     Model #: [id number]     Serial #: [id number]     Status: active  Indications::  NICM   ICD Follow Up Remote Check?  No Battery Voltage:  3.11 V     Charge Time:  10.9 seconds     Underlying rhythm:  SR ICD Dependent:  No       ICD Device Measurements Right Ventricle:  Amplitude: 28.1 mV, Impedance: 685 ohms, Threshold: 0.4 V at 0.5 msec  Episodes Coumadin:  No Shock:  0     ATP:  0     Nonsustained:  0     Ventricular Pacing:  0%  Brady Parameters Mode VVI     Lower Rate Limit:  45      Tachy Zones VF:  188     Tech Comments:  Device checked by industry for research.  RV reprogrammed 2.4@0 .5.    ROV 3 months clinic. Altha Harm, LPN  August 25, 2009 12:36 PM   Impression & Recommendations:  Problem # 1:  CAD, NATIVE VESSEL (ICD-414.01) Symptoms concerning for recurrent low-level angina. Will increase Toprol to 50 and add Imdur 30. Discussed cath but will start with lexiscan Myoview for now (unable to walk far). If she has severe episode of CP knows to call 911.  Problem # 2:  SYSTOLIC HEART FAILURE, CHRONIC (ICD-428.22) Volume status stable. NYHA Class II-III.  Increasing Toprol. Restart spiro. Will stop digoxin. Reminded her for need for compliance.   Problem # 3:  NICOTINE ADDICTION (ICD-305.1) Discussed need for complete smoking cessation  Other Orders: Nuclear Stress Test (Nuc Stress Test)  Patient Instructions: 1)  Your physician recommends that you schedule a follow-up appointment in: 2 months 2)   Your MD order :Steffanie Dunn. 3)  Your physician has recommended you to  make the following change in your medication: Stop Digoxin, Toprol dose increased to 50 mg daily. New medication Imdur 30 mg take one tablet daily Prescriptions: SPIRONOLACTONE 25 MG TABS (SPIRONOLACTONE) Take one half  tablet by mouth daily  #30 x 6   Entered by:   Ollen Gross, RN, BSN   Authorized by:   Dolores Patty, MD, [facility]   Signed by:    Ollen Gross, RN, BSN on 08/25/2009   Method used:   Electronically to        [facility] [phone]* (retail)       [address]       [place], [place]  [address]       Ph: [phone]  Fax: [phone]   RxID:   [id number] IMDUR 30 MG XR24H-TAB (ISOSORBIDE MONONITRATE) take one tablet by mouth daily  #30 x 6   Entered by:   Ollen Gross, RN, BSN   Authorized by:   Dolores Patty, MD, [facility]   Signed by:   Ollen Gross, RN, BSN on 08/25/2009   Method used:   Electronically to        [facility] [phone]* (retail)       [address]       [place], [place]  [address]       Ph: [phone]       Fax: [phone]   RxID:   [id number] METOPROLOL SUCCINATE 50 MG XR24H-TAB (METOPROLOL SUCCINATE) Take one tablet by mouth daily  #30 x 6   Entered by:   Ollen Gross, RN, BSN   Authorized by:   Dolores Patty, MD, [facility]   Signed by:   Ollen Gross, RN, BSN on 08/25/2009   Method used:   Electronically to        [facility] [phone]* (retail)       [address]       [place], [place]  [address]       Ph: [phone]       Fax: [phone]   RxID:   [id number]

## 2010-04-13 NOTE — Assessment & Plan Note (Signed)
Summary: per check out/sf   Visit Type:  Follow-up Referring Provider:  [name] Primary Provider:  [name]  CC:  shortness of breath - Bronchitis.  History of Present Illness: Jessica [name] is a 50 year old woman with a history of severe congestive heart failure, secondary ischemic cardiomyopathy with an ejection fraction of 25-30%. She also has a history of coronary artery disease, status post multiple interventions and stents, diabetes, hyperlipidemia, history of ongoing tobacco and possibly alcohol use.  She has been followed in the heart failure clinic and has had a problem with noncompliance.   Inolerant of ACE-I due to severe cough.  Had stress test in 09/2009 for CP.  EF:  25 %. LV markedly dilated. 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 No ischemia.  Here for routine f/u. Not feewling well recently due to bronchitis. Seen in ER  and got amoxicillin on 02/13/10. Then saw Dr. Shana Chute and got nebulizer. Severe cough with productive sputum. No fevers or chills. Stays cold. Cut Toprol in half as she felt it was causing acid reflux. +ab bloating/orthopnea.  ICD shocked her around Thanksgiving. It does not appear that device has been interrogated since then.   Hard to smoke due to bronchitis.   Current Medications (verified): 1)  Furosemide 80 Mg Tabs (Furosemide) .... Take One Tablet By Mouth Two Times A Day 2)  Diovan 160 Mg Tabs (Valsartan) .... Take One Tablet By Mouth Two Times A Day 3)  Aspirin 81 Mg Tbec (Aspirin) .... Take One Tablet By Mouth Daily 4)  Glimepiride 2 Mg Tabs (Glimepiride) .[name].. 1 By Mouth Once Daily 5)  Potassium Chloride Crys Cr 20 Meq Cr-Tabs (Potassium Chloride Crys Cr) .... Two Times A Day 6)  Crestor 40 Mg Tabs (Rosuvastatin Calcium) .[name].. 1 By Mouth Daily 7)  Zaroxolyn 2.5 Mg Tabs (Metolazone) .... Take 1 Tablet By Mouth Once A Day As Needed 8)  Metoprolol Succinate 50 Mg  Xr24h-Tab (Metoprolol Succinate) .... Take One Tablet By Mouth Daily 9)  Spironolactone 25 Mg Tabs (Spironolactone) .... Take 1 Tablet By Mouth Once A Day 10)  Protonix 40 Mg Tbec (Pantoprazole Sodium) .... Take 1 Tablet By Mouth Once A Day 11)  Digoxin 0.25 Mg Tabs (Digoxin) .... Take One Tablet By Mouth Daily  Allergies (verified): 1)  ! * Altace 2)  ! * Adhesive  Past History:  Past Medical History: Last updated: 05/07/2008  1. Congestive heart failure secondary to severe ischemic       cardiomyopathy with EF of 20-25% status post recent implantation of       a Biotronik single-chamber ICD by Dr. Lewayne Bunting in December       2009.        -- RHC 2/09:  RA 18, PA 56/30 (40), PCWP 36. FICK 3.89/2.06. PVR 3.5 Woods   2. Coronary artery disease status post multiple PCI.   3. Ongoing tobacco and EtOH abuse.   4. History of medical noncompliance.   5. Type 2 diabetes.   6. Dizziness  7. Noncompliance  Review of Systems       As per HPI and past medical history; otherwise all systems negative.   Vital Signs:  Patient profile:   50 year old female Height:      63 inches Weight:      182 pounds BMI:     32.36 Pulse rate:   75 / minute BP sitting:  104 / 76  (left arm) Cuff size:   regular  Vitals Entered By: Hardin Negus, RMA (March 21, 2010 2:04 PM)  Physical Exam  General:  Feels fatigued+ hacking cough HEENT: normal Neck: supple. JVP flat. Carotids 2+ bilat; no bruits. Cor: PMI nondisplaced. Regular  rhythm. . No rubs, murmur. no s3 Lungs: clear Abdomen: obese. soft, nontender, nondistended.  No bruits or masses. Good bowel sounds. Extremities: no cyanosis, clubbing, rash, edema Neuro: alert & orientedx3, cranial nerves grossly intact. moves all 4 extremities w/o difficulty. affect pleasant     ICD Specifications Following MD:  Lewayne Bunting, MD     Referring MD:  [facility] ICD Vendor:  Biotronik     ICD Model Number:  540     ICD Serial Number:   [id number] ICD DOI:  02/17/2008     ICD Implanting MD:  Lewayne Bunting, MD  Lead 1:    Location: RV     DOI: 02/17/2008     Model #: [id number]     Serial #: [id number]     Status: active  Indications::  NICM   ICD Follow Up ICD Dependent:  No      Episodes Coumadin:  No  Brady Parameters Mode VVI     Lower Rate Limit:  45      Tachy Zones VF:  188     Impression & Recommendations:  Problem # 1:  SYSTOLIC HEART FAILURE, CHRONIC (ICD-428.22) Volume status looks fine. She is not wheezing with Toprol so probably fine but if cannot tolerate higher doses may have to use more selective agent like bisoprolol. Given severe bronchitis will not push her therapy today. Will have device interrogated due to ICD firing.   Problem # 2:  Bronchitis Now with prolonged URI symptoms. Cannot afford Avelox so will use doxy 100 two times a day x 10 days in an effort to also cover atypicals. No wheezing so will not give steroids.   Problem # 3:  CAD, NATIVE VESSEL (ICD-414.01) Stable. No evidence of ischemia. Continue current regimen.  Problem # 4:  NICOTINE ADDICTION (ICD-305.1) Counseled on need to quit smoking completely.  Other Orders: T-2 View CXR (71020TC)  Patient Instructions: 1)  Start Doxycycline 100mg  two times a day for 10 days 2)  A chest x-ray takes a picture of the organs and structures inside the chest, including the heart, lungs, and blood vessels. This test can show several things, including, whether the heart is enlarged; whether fluid is building up in the lungs; and whether pacemaker / defibrillator leads are still in place. 3)  Follow up in 2 months. Prescriptions: DOXYCYCLINE HYCLATE 100 MG CAPS (DOXYCYCLINE HYCLATE) two times a day for 10 days  #20 x 0   Entered by:   Meredith Staggers, RN   Authorized by:   Dolores Patty, MD, [facility]   Signed by:   Meredith Staggers, RN on 03/21/2010   Method used:   Electronically to        [facility] [phone]* (retail)       [address]       [place], [place]  [address]       Ph: [phone]       Fax: [phone]   RxID:   [id number] POTASSIUM CHLORIDE CRYS CR 20 MEQ CR-TABS (POTASSIUM CHLORIDE CRYS CR) two times a day  #60 Each x 6   Entered by:   Meredith Staggers, RN   Authorized by:   Dolores Patty,  MD, [facility]   Signed by:   Meredith Staggers, RN on 03/21/2010   Method used:   Electronically to        [facility] [phone]* (retail)       [address]       [place], [place]  [address]       Ph: [phone]       Fax: [phone]   RxID:   [id number]

## 2010-04-13 NOTE — Procedures (Signed)
Summary: Cardiology Device Clinic   Allergies: 1)  ! * Altace 2)  ! * Adhesive   ICD Specifications Following MD:  Lewayne Bunting, MD     Referring MD:  [facility] ICD Vendor:  Biotronik     ICD Model Number:  540     ICD Serial Number:  [id number] ICD DOI:  02/17/2008     ICD Implanting MD:  Lewayne Bunting, MD  Lead 1:    Location: RV     DOI: 02/17/2008     Model #: [id number]     Serial #: [id number]     Status: active  Indications::  NICM   ICD Follow Up Remote Check?  No Battery Voltage:  3.12 V     Charge Time:  11.0 seconds     Battery Est. Longevity:  MOL1 Underlying rhythm:  SR ICD Dependent:  No       ICD Device Measurements Right Ventricle:  Amplitude: 10.3 mV, Impedance: 481 ohms, Threshold: 0.5 V at 0.5 msec Shock Impedance: 42 ohms   Episodes Coumadin:  No Shock:  2     ATP:  1     Nonsustained:  0     Ventricular Pacing:  0%  Brady Parameters Mode VVI     Lower Rate Limit:  45      Tachy Zones VF:  188     Tech Comments:  Jessica [name] was seen today during her routine visit with Dr. Gala Romney.  She has had 2 ICD discharges one back in November which was reviewed by Dr. Ladona Ridgel on her remote follow up and one 02/20/10 that ATP was unsuccessful and 20j  returning to SR.  She was unaware of this episode.  She is scheduled to see Dr. Ladona Ridgel in weeks. Jessica Harm, Jessica [name]  March 21, 2010 4:38 PM

## 2010-04-13 NOTE — Assessment & Plan Note (Signed)
Summary: biotronik icd   Referring Provider:  Nicholes [name] Primary Provider:  Donia [name]   History of Present Illness: Jessica [name] returns today for followup.  She is a pleasant 50 yo woman with a longstanding DCM, Class 2 CHF, HTN, and VT.  She has recently received several ICD therapies. She denies c/p or peripheral edema. She admits to being anxious about additional ICD shocks. No syncope.   Allergies: 1)  ! * Altace 2)  ! * Adhesive  Past History:  Past Medical History: Last updated: 05/07/2008  1. Congestive heart failure secondary to severe ischemic       cardiomyopathy with EF of 20-25% status post recent implantation of       a Biotronik single-chamber ICD by Dr. Lewayne [name] in December       2009.        -- RHC 2/09:  RA 18, PA 56/30 (40), PCWP 36. FICK 3.89/2.06. PVR 3.5 Woods   2. Coronary artery disease status post multiple PCI.   3. Ongoing tobacco and EtOH abuse.   4. History of medical noncompliance.   5. Type 2 diabetes.   6. Dizziness  7. Noncompliance  Review of Systems  The patient denies chest pain, syncope, dyspnea on exertion, and peripheral edema.    Physical Exam  General:  Feels fatigued+ hacking cough HEENT: normal Neck: supple. JVP flat. Carotids 2+ bilat; no bruits. Cor: PMI nondisplaced. Regular  rhythm. . No rubs, murmur. no s3 Lungs: clear Abdomen: obese. soft, nontender, nondistended.  No bruits or masses. Good bowel sounds. Extremities: no cyanosis, clubbing, rash, edema Neuro: alert & orientedx3, cranial nerves grossly intact. moves all 4 extremities w/o difficulty. affect pleasant     ICD Specifications Following MD:  Jessica Bunting, MD     Referring MD:  [facility] ICD Vendor:  Biotronik     ICD Model Number:  540     ICD Serial Number:  [id number] ICD DOI:  02/17/2008     ICD Implanting MD:  Jessica Bunting, MD  Lead 1:    Location: RV     DOI: 02/17/2008     Model #: [id number]     Serial #: [id number]     Status:  active  Indications::  NICM   ICD Follow Up ICD Dependent:  No      Episodes Coumadin:  No  Brady Parameters Mode VVI     Lower Rate Limit:  45      Tachy Zones VF:  188     MD Comments:  Her ICD is working normally. Will follow.  Impression & Recommendations:  Problem # 1:  ICD - IN SITU (ICD-V45.02) Her device is working normally. will recheck in several months.  Problem # 2:  SYSTOLIC HEART FAILURE, CHRONIC (ICD-428.22) Her symptoms appear to be class 2. Will follow. Her updated medication list for this problem includes:    Furosemide 80 Mg Tabs (Furosemide) .[name]... Take one tablet by mouth two times a day    Diovan 160 Mg Tabs (Valsartan) .[name]... Take one tablet by mouth two times a day    Aspirin 81 Mg Tbec (Aspirin) .[name]... Take one tablet by mouth daily    Zaroxolyn 2.5 Mg Tabs (Metolazone) .[name]... Take 1 tablet by mouth once a day as needed    Metoprolol Succinate 50 Mg Xr24h-tab (Metoprolol succinate) .[name]... Take one tablet by mouth daily    Spironolactone 25 Mg Tabs (Spironolactone) .[name]... Take 1 tablet by mouth once a day  Digoxin 0.25 Mg Tabs (Digoxin) .[name]... 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 Will follow. She has not had enough therapy to warrant an anti-arrhythmic drug. Her updated medication list for this problem includes:    Aspirin 81 Mg Tbec (Aspirin) .[name]... Take one tablet by mouth daily    Metoprolol Succinate 50 Mg Xr24h-tab (Metoprolol succinate) .[name]... Take one tablet by mouth daily

## 2010-04-17 ENCOUNTER — Other Ambulatory Visit: Payer: Self-pay | Admitting: Internal Medicine

## 2010-04-17 ENCOUNTER — Ambulatory Visit (INDEPENDENT_AMBULATORY_CARE_PROVIDER_SITE_OTHER)
Admission: RE | Admit: 2010-04-17 | Discharge: 2010-04-17 | Disposition: A | Discharge status: Home / Self Care | Payer: Self-pay | Source: Ambulatory Visit | Attending: Internal Medicine | Admitting: Internal Medicine

## 2010-04-17 DIAGNOSIS — J40 Bronchitis, not specified as acute or chronic: Secondary | ICD-10-CM

## 2010-04-19 ENCOUNTER — Telehealth: Payer: Self-pay | Admitting: Internal Medicine

## 2010-04-21 ENCOUNTER — Telehealth (INDEPENDENT_AMBULATORY_CARE_PROVIDER_SITE_OTHER): Payer: Self-pay | Admitting: *Deleted

## 2010-04-24 ENCOUNTER — Encounter: Payer: Self-pay | Admitting: Internal Medicine

## 2010-04-24 ENCOUNTER — Encounter (INDEPENDENT_AMBULATORY_CARE_PROVIDER_SITE_OTHER): Payer: Medicare Other

## 2010-04-24 DIAGNOSIS — I428 Other cardiomyopathies: Secondary | ICD-10-CM

## 2010-04-27 NOTE — Progress Notes (Signed)
Summary: problems with device  Phone Note Call from Patient Call back at Home Phone [phone]   Caller: Patient Summary of Call: pt states that she is having problems with her pacermaker. (see notes from 2/8) Initial call taken by: Edman Circle,  April 21, 2010 9:59 AM  Follow-up for Phone Call        clinic appt. scheduled 2/13@4pm  Follow-up by: Altha Harm, LPN,  April 21, 2010 5:17 PM

## 2010-04-27 NOTE — Progress Notes (Signed)
Summary: Pt returning call  Phone Note Call from Patient Call back at Home Phone [phone] [facility]     Caller: Patient Summary of Call: Pt returning call for test results Initial call taken by: Judie Grieve,  April 19, 2010 9:48 AM  Follow-up for Phone Call        talked w/pt she states she has been feeling bad, feels like she has a lot of fluid in her belly, but wt is actually down a few lbs., she states she had an episode last pm where she felt very dizzy and lightheaded and thought she was going to pass out, feeling a little better now she states if she gets to feeling bad again she may go to hospital  pt stated she was calling heather back. pt states heather was going to talk to the dr and get back with the pt. pt states she has not heard form heather. pt stated heather doesn not have to call her back pt was going to the er. pt did not want to answer any question. Follow-up by: Roe Coombs,  April 19, 2010 3:37 PM  Additional Follow-up for Phone Call Additional follow up Details #1::        discussed w/Dr Alta Goding and spoke w/pt again advised if cont. to feel bad go to ER if needed she states if she gets as bad tonight as she did last pm she will go to ER, if she doesn't go she will call me in AM to see if we can bring her in to check her device  Meredith Staggers, RN  April 19, 2010 5:32 PM

## 2010-05-03 NOTE — Procedures (Signed)
Summary: icd biotronik   Allergies: 1)  ! * Altace 2)  ! * Adhesive   ICD Specifications Following MD:  Lewayne Bunting, MD     Referring MD:  [facility] ICD Vendor:  Biotronik     ICD Model Number:  540     ICD Serial Number:  [id number] ICD DOI:  02/17/2008     ICD Implanting MD:  Lewayne Bunting, MD  Lead 1:    Location: RV     DOI: 02/17/2008     Model #: [id number]     Serial #: [id number]     Status: active  Indications::  NICM   ICD Follow Up ICD Dependent:  No      Episodes Coumadin:  No  Brady Parameters Mode VVI     Lower Rate Limit:  45      Tachy Zones VF:  188     Tech Comments:  see Pace Art

## 2010-05-05 ENCOUNTER — Encounter (INDEPENDENT_AMBULATORY_CARE_PROVIDER_SITE_OTHER): Payer: Self-pay | Admitting: *Deleted

## 2010-05-09 NOTE — Cardiovascular Report (Signed)
Summary: Office Visit   Office Visit   Imported By: Roderic Ovens 05/03/2010 15:51:52  _____________________________________________________________________  External Attachment:    Type:   Image     Comment:   External Document

## 2010-05-09 NOTE — Letter (Signed)
Summary: Legrand Rams D.D.S.  Gwenyth Ober Bonnick D.D.S.   Imported By: Marylou Mccoy 05/03/2010 10:11:57  _____________________________________________________________________  External Attachment:    Type:   Image     Comment:   External Document

## 2010-05-09 NOTE — Letter (Signed)
Summary: Appointment - Reschedule  [facility], Main Office  [address]   [place], [place] [address]   Phone: [phone]  Fax: [phone]     May 05, 2010 MRN: [id number]   [facility] [address] [place], [place]  [address]   Dear Ms. Fiscus,   Due to a change in our office schedule, your appointment on  March 2,2012 at 3:45 must be changed.  It is very important that we reach you to reschedule this appointment. We look forward to participating in your health care needs. Please contact us at the number listed above at your earliest convenience to reschedule this appointment.     Sincerely, Control and instrumentation engineer

## 2010-05-12 ENCOUNTER — Ambulatory Visit: Payer: Self-pay | Admitting: Internal Medicine

## 2010-06-19 ENCOUNTER — Inpatient Hospital Stay (HOSPITAL_COMMUNITY)
Admission: EM | Admit: 2010-06-19 | Discharge: 2010-06-20 | Disposition: A | Discharge status: Other Acute Inpatient Hospital | Payer: Medicare Other | Source: Home / Self Care | Attending: Cardiovascular Disease | Admitting: Cardiovascular Disease

## 2010-06-19 ENCOUNTER — Emergency Department (HOSPITAL_COMMUNITY): Payer: Medicare Other

## 2010-06-19 DIAGNOSIS — Z95810 Presence of automatic (implantable) cardiac defibrillator: Secondary | ICD-10-CM

## 2010-06-19 DIAGNOSIS — E119 Type 2 diabetes mellitus without complications: Secondary | ICD-10-CM | POA: Diagnosis present

## 2010-06-19 DIAGNOSIS — I959 Hypotension, unspecified: Secondary | ICD-10-CM | POA: Diagnosis present

## 2010-06-19 DIAGNOSIS — E871 Hypo-osmolality and hyponatremia: Secondary | ICD-10-CM | POA: Diagnosis present

## 2010-06-19 DIAGNOSIS — F17200 Nicotine dependence, unspecified, uncomplicated: Secondary | ICD-10-CM | POA: Diagnosis present

## 2010-06-19 DIAGNOSIS — I2510 Atherosclerotic heart disease of native coronary artery without angina pectoris: Secondary | ICD-10-CM | POA: Diagnosis present

## 2010-06-19 DIAGNOSIS — I10 Essential (primary) hypertension: Secondary | ICD-10-CM | POA: Diagnosis present

## 2010-06-19 DIAGNOSIS — I499 Cardiac arrhythmia, unspecified: Secondary | ICD-10-CM | POA: Diagnosis present

## 2010-06-19 DIAGNOSIS — I82409 Acute embolism and thrombosis of unspecified deep veins of unspecified lower extremity: Principal | ICD-10-CM | POA: Diagnosis present

## 2010-06-19 DIAGNOSIS — M79609 Pain in unspecified limb: Secondary | ICD-10-CM

## 2010-06-19 DIAGNOSIS — I252 Old myocardial infarction: Secondary | ICD-10-CM

## 2010-06-19 DIAGNOSIS — E785 Hyperlipidemia, unspecified: Secondary | ICD-10-CM | POA: Diagnosis present

## 2010-06-19 DIAGNOSIS — I2589 Other forms of chronic ischemic heart disease: Secondary | ICD-10-CM | POA: Diagnosis present

## 2010-06-19 LAB — POCT CARDIAC MARKERS: Troponin i, poc: 0.1 ng/mL — ABNORMAL HIGH (ref 0.00–0.09)

## 2010-06-19 LAB — DIFFERENTIAL
Basophils Relative: 0 % (ref 0–1)
Eosinophils Absolute: 0 10*3/uL (ref 0.0–0.7)
Eosinophils Relative: 0 % (ref 0–5)
Lymphs Abs: 2 10*3/uL (ref 0.7–4.0)
Monocytes Absolute: 0.4 10*3/uL (ref 0.1–1.0)
Monocytes Relative: 6 % (ref 3–12)

## 2010-06-19 LAB — POCT I-STAT, CHEM 8
BUN: 14 mg/dL (ref 6–23)
Creatinine, Ser: 1.2 mg/dL (ref 0.4–1.2)
Potassium: 3.5 mEq/L (ref 3.5–5.1)
Sodium: 136 mEq/L (ref 135–145)

## 2010-06-19 LAB — CBC
MCH: 25.3 pg — ABNORMAL LOW (ref 26.0–34.0)
MCHC: 31.7 g/dL (ref 30.0–36.0)
MCV: 79.6 fL (ref 78.0–100.0)
Platelets: 205 10*3/uL (ref 150–400)
RDW: 18.3 % — ABNORMAL HIGH (ref 11.5–15.5)

## 2010-06-20 ENCOUNTER — Other Ambulatory Visit: Payer: Self-pay | Admitting: Vascular Surgery

## 2010-06-20 ENCOUNTER — Inpatient Hospital Stay (HOSPITAL_COMMUNITY)
Admission: RE | Admit: 2010-06-20 | Discharge: 2010-06-26 | DRG: 253 | Disposition: A | Discharge status: Home / Self Care | Payer: Medicare Other | Source: Ambulatory Visit | Attending: Vascular Surgery | Admitting: Vascular Surgery

## 2010-06-20 ENCOUNTER — Inpatient Hospital Stay (HOSPITAL_COMMUNITY): Payer: Medicare Other

## 2010-06-20 DIAGNOSIS — T798XXA Other early complications of trauma, initial encounter: Secondary | ICD-10-CM

## 2010-06-20 DIAGNOSIS — M79609 Pain in unspecified limb: Secondary | ICD-10-CM

## 2010-06-20 DIAGNOSIS — I743 Embolism and thrombosis of arteries of the lower extremities: Secondary | ICD-10-CM

## 2010-06-20 DIAGNOSIS — M629 Disorder of muscle, unspecified: Secondary | ICD-10-CM

## 2010-06-20 LAB — CBC
Hemoglobin: 12.5 g/dL (ref 12.0–15.0)
MCV: 80.2 fL (ref 78.0–100.0)
Platelets: 183 10*3/uL (ref 150–400)
RBC: 4.85 MIL/uL (ref 3.87–5.11)
WBC: 5.9 10*3/uL (ref 4.0–10.5)

## 2010-06-20 LAB — GLUCOSE, CAPILLARY
Glucose-Capillary: 221 mg/dL — ABNORMAL HIGH (ref 70–99)
Glucose-Capillary: 190 mg/dL — ABNORMAL HIGH (ref 70–99)
Glucose-Capillary: 199 mg/dL — ABNORMAL HIGH (ref 70–99)
Glucose-Capillary: 145 mg/dL — ABNORMAL HIGH (ref 70–99)

## 2010-06-21 DIAGNOSIS — Z48812 Encounter for surgical aftercare following surgery on the circulatory system: Secondary | ICD-10-CM

## 2010-06-21 DIAGNOSIS — T79A29A Traumatic compartment syndrome of unspecified lower extremity, initial encounter: Secondary | ICD-10-CM

## 2010-06-21 LAB — BASIC METABOLIC PANEL
Calcium: 7.9 mg/dL — ABNORMAL LOW (ref 8.4–10.5)
Calcium: 8 mg/dL — ABNORMAL LOW (ref 8.4–10.5)
Chloride: 102 mEq/L (ref 96–112)
Creatinine, Ser: 1.13 mg/dL (ref 0.4–1.2)
Creatinine, Ser: 1.15 mg/dL (ref 0.4–1.2)
GFR calc Af Amer: >60 mL/min (ref >60)
GFR calc Af Amer: >60 mL/min (ref >60)
GFR calc non Af Amer: 51 mL/min — ABNORMAL LOW (ref >60)
Sodium: 133 mEq/L — ABNORMAL LOW (ref 135–145)

## 2010-06-21 LAB — GLUCOSE, CAPILLARY
Glucose-Capillary: 76 mg/dL (ref 70–99)
Glucose-Capillary: 93 mg/dL (ref 70–99)
Glucose-Capillary: 116 mg/dL — ABNORMAL HIGH (ref 70–99)
Glucose-Capillary: 148 mg/dL — ABNORMAL HIGH (ref 70–99)
Glucose-Capillary: 71 mg/dL (ref 70–99)

## 2010-06-21 LAB — CBC
Platelets: 193 10*3/uL (ref 150–400)
RDW: 18.1 % — ABNORMAL HIGH (ref 11.5–15.5)
WBC: 6.1 10*3/uL (ref 4.0–10.5)

## 2010-06-21 LAB — PROTIME-INR: INR: 1.64 — ABNORMAL HIGH (ref 0.00–1.49)

## 2010-06-21 LAB — MAGNESIUM: Magnesium: 1.5 mg/dL (ref 1.5–2.5)

## 2010-06-21 LAB — HEPARIN LEVEL (UNFRACTIONATED)
Heparin Unfractionated: 0.34 IU/mL (ref 0.30–0.70)
Heparin Unfractionated: 0.77 IU/mL — ABNORMAL HIGH (ref 0.30–0.70)

## 2010-06-22 ENCOUNTER — Inpatient Hospital Stay (HOSPITAL_COMMUNITY): Payer: Medicare Other

## 2010-06-22 LAB — CBC
Platelets: 185 10*3/uL (ref 150–400)
RDW: 18.2 % — ABNORMAL HIGH (ref 11.5–15.5)
WBC: 6.3 10*3/uL (ref 4.0–10.5)

## 2010-06-22 LAB — BASIC METABOLIC PANEL
BUN: 15 mg/dL (ref 6–23)
Calcium: 8.1 mg/dL — ABNORMAL LOW (ref 8.4–10.5)
Creatinine, Ser: 0.86 mg/dL (ref 0.4–1.2)
GFR calc Af Amer: >60 mL/min (ref >60)

## 2010-06-22 LAB — GLUCOSE, CAPILLARY
Glucose-Capillary: 88 mg/dL (ref 70–99)
Glucose-Capillary: 129 mg/dL — ABNORMAL HIGH (ref 70–99)
Glucose-Capillary: 215 mg/dL — ABNORMAL HIGH (ref 70–99)

## 2010-06-22 LAB — MAGNESIUM: Magnesium: 1.6 mg/dL (ref 1.5–2.5)

## 2010-06-22 LAB — BRAIN NATRIURETIC PEPTIDE: Pro B Natriuretic peptide (BNP): 557 pg/mL — ABNORMAL HIGH (ref 0.0–100.0)

## 2010-06-23 LAB — BASIC METABOLIC PANEL
BUN: 15 mg/dL (ref 6–23)
Chloride: 99 mEq/L (ref 96–112)
GFR calc Af Amer: >60 mL/min (ref >60)
GFR calc non Af Amer: 60 mL/min — ABNORMAL LOW (ref >60)
Potassium: 4.1 mEq/L (ref 3.5–5.1)

## 2010-06-23 LAB — CBC
Hemoglobin: 11 g/dL — ABNORMAL LOW (ref 12.0–15.0)
MCH: 25.9 pg — ABNORMAL LOW (ref 26.0–34.0)
MCV: 77.8 fL — ABNORMAL LOW (ref 78.0–100.0)
RBC: 4.24 MIL/uL (ref 3.87–5.11)
WBC: 6 10*3/uL (ref 4.0–10.5)

## 2010-06-23 LAB — HEPARIN LEVEL (UNFRACTIONATED): Heparin Unfractionated: 0.33 IU/mL (ref 0.30–0.70)

## 2010-06-23 LAB — PROTIME-INR
INR: 1.52 — ABNORMAL HIGH (ref 0.00–1.49)
Prothrombin Time: 18.5 seconds — ABNORMAL HIGH (ref 11.6–15.2)

## 2010-06-23 LAB — GLUCOSE, CAPILLARY
Glucose-Capillary: 119 mg/dL — ABNORMAL HIGH (ref 70–99)
Glucose-Capillary: 170 mg/dL — ABNORMAL HIGH (ref 70–99)
Glucose-Capillary: 223 mg/dL — ABNORMAL HIGH (ref 70–99)

## 2010-06-24 LAB — BASIC METABOLIC PANEL
BUN: 10 mg/dL (ref 6–23)
CO2: 23 mEq/L (ref 19–32)
Glucose, Bld: 197 mg/dL — ABNORMAL HIGH (ref 70–99)
Potassium: 4.1 mEq/L (ref 3.5–5.1)
Sodium: 130 mEq/L — ABNORMAL LOW (ref 135–145)

## 2010-06-24 LAB — GLUCOSE, CAPILLARY
Glucose-Capillary: 178 mg/dL — ABNORMAL HIGH (ref 70–99)
Glucose-Capillary: 215 mg/dL — ABNORMAL HIGH (ref 70–99)

## 2010-06-24 LAB — CBC
HCT: 31.5 % — ABNORMAL LOW (ref 36.0–46.0)
Hemoglobin: 10.2 g/dL — ABNORMAL LOW (ref 12.0–15.0)
MCV: 77.8 fL — ABNORMAL LOW (ref 78.0–100.0)
WBC: 4.9 10*3/uL (ref 4.0–10.5)

## 2010-06-24 LAB — MAGNESIUM: Magnesium: 1.8 mg/dL (ref 1.5–2.5)

## 2010-06-24 LAB — PROTIME-INR: INR: 2.11 — ABNORMAL HIGH (ref 0.00–1.49)

## 2010-06-25 LAB — CBC
HCT: 31.5 % — ABNORMAL LOW (ref 36.0–46.0)
Hemoglobin: 10.2 g/dL — ABNORMAL LOW (ref 12.0–15.0)
RBC: 4.06 MIL/uL (ref 3.87–5.11)
RDW: 18.4 % — ABNORMAL HIGH (ref 11.5–15.5)
WBC: 4.7 10*3/uL (ref 4.0–10.5)

## 2010-06-25 LAB — GLUCOSE, CAPILLARY
Glucose-Capillary: 196 mg/dL — ABNORMAL HIGH (ref 70–99)
Glucose-Capillary: 191 mg/dL — ABNORMAL HIGH (ref 70–99)

## 2010-06-25 LAB — PROTIME-INR
INR: 2.49 — ABNORMAL HIGH (ref 0.00–1.49)
Prothrombin Time: 27 seconds — ABNORMAL HIGH (ref 11.6–15.2)

## 2010-06-26 LAB — BASIC METABOLIC PANEL
CO2: 25 mEq/L (ref 19–32)
Calcium: 8.7 mg/dL (ref 8.4–10.5)
GFR calc Af Amer: >60 mL/min (ref >60)
Potassium: 3.9 mEq/L (ref 3.5–5.1)
Sodium: 136 mEq/L (ref 135–145)

## 2010-06-26 LAB — CBC
HCT: 32.1 % — ABNORMAL LOW (ref 36.0–46.0)
Hemoglobin: 10.5 g/dL — ABNORMAL LOW (ref 12.0–15.0)
MCH: 25.2 pg — ABNORMAL LOW (ref 26.0–34.0)
RBC: 4.16 MIL/uL (ref 3.87–5.11)

## 2010-06-26 LAB — GLUCOSE, CAPILLARY: Glucose-Capillary: 204 mg/dL — ABNORMAL HIGH (ref 70–99)

## 2010-06-28 ENCOUNTER — Ambulatory Visit (INDEPENDENT_AMBULATORY_CARE_PROVIDER_SITE_OTHER): Payer: Medicare Other | Admitting: *Deleted

## 2010-06-28 DIAGNOSIS — I824Z9 Acute embolism and thrombosis of unspecified deep veins of unspecified distal lower extremity: Secondary | ICD-10-CM

## 2010-06-28 LAB — POCT INR: INR: 4

## 2010-07-03 ENCOUNTER — Ambulatory Visit (INDEPENDENT_AMBULATORY_CARE_PROVIDER_SITE_OTHER): Payer: Medicare Other | Admitting: *Deleted

## 2010-07-03 ENCOUNTER — Encounter (INDEPENDENT_AMBULATORY_CARE_PROVIDER_SITE_OTHER): Payer: Medicare Other

## 2010-07-03 DIAGNOSIS — I739 Peripheral vascular disease, unspecified: Secondary | ICD-10-CM

## 2010-07-03 DIAGNOSIS — I824Z9 Acute embolism and thrombosis of unspecified deep veins of unspecified distal lower extremity: Secondary | ICD-10-CM

## 2010-07-03 DIAGNOSIS — Z48812 Encounter for surgical aftercare following surgery on the circulatory system: Secondary | ICD-10-CM

## 2010-07-03 LAB — POCT INR: INR: 4

## 2010-07-03 NOTE — Op Note (Signed)
  Jessica [name], Jessica [name]             ACCOUNT NO.:  [id number]  MEDICAL RECORD NO.:  [id number]           PATIENT TYPE:  I  LOCATION:  2313                         FACILITY:  [facility]  PHYSICIAN:  Janetta Hora. Fields, MD  DATE OF BIRTH:  10-Nov-1960  DATE OF PROCEDURE:  06/21/2010 DATE OF DISCHARGE:                              OPERATIVE REPORT   PROCEDURE:  Closure of fasciotomies, right lower extremity.  PREOPERATIVE DIAGNOSIS:  Open fasciotomies, right leg.  POSTOPERATIVE DIAGNOSIS:  Open fasciotomies, right leg.  ANESTHESIA:  Local.  OPERATIVE DETAILS:  After discussion with the patient, the patient's entire leg was prepped and draped in usual sterile fashion.  It was thoroughly irrigated with normal saline solution.  The wounds were quite clean at this point.  The skin edges were then reapproximated with staples.  The patient tolerated the procedure well, and there were no complications.  A dry, sterile dressing was applied to the wound.     Janetta Hora. Fields, MD     CEF/MEDQ  D:  06/21/2010  T:  06/22/2010  Job:  [id number]  Electronically Signed by Fabienne Bruns MD on 07/03/2010 11:06:52 AM

## 2010-07-03 NOTE — Consult Note (Signed)
NAMEJANEENE, SAND             ACCOUNT NO.:  [id number]  MEDICAL RECORD NO.:  [id number]           PATIENT TYPE:  I  LOCATION:  2313                         FACILITY:  [facility]  PHYSICIAN:  Janetta Hora. Fields, MD  DATE OF BIRTH:  Oct 17, 1960  DATE OF CONSULTATION:  06/20/2010 DATE OF DISCHARGE:                                CONSULTATION   REQUESTING PHYSICIAN:  Osvaldo Shipper. Spruill, MD  REASON FOR CONSULTATION:  Acute ischemia, right leg.  HISTORY OF PRESENT ILLNESS:  The patient is a 50 year old female admitted yesterday to [facility] for right leg pain and collapse.  This occurred approximately 1 p.m. yesterday.  She had numbness and tingling in the right foot and also decreased motor function.  Motor function in right foot is improved, but she still has numbness and tingling and coolness in the right foot.  She was also noted to have a left lower extremity DVT yesterday.  She states that she hurts in the right leg from the hip all the way down to the foot.  The patient has a known history of "irregular heartbeat."  She has been on Coumadin in the past.  She was started on therapeutic Lovenox and Coumadin yesterday for her DVT.  The patient also has a history of myocardial infarction in the past and has congestive heart failure and previously had a left-sided AICD placed.  PAST MEDICAL HISTORY: 1. Coronary artery disease with multiple prior coronary artery stents. 2. Hypertension. 3. Hyperlipidemia. 4. Diabetes.  PAST SURGICAL HISTORY: 1. AICD, left chest. 2. Multiple dental extractions.  SOCIAL HISTORY:  She has an occasional beer.  She smokes approximately one-half pack of cigarettes per day.  She does not have any drug use.  ALLERGIES:  She has allergies listed to ALTACE and LATEX.  REVIEW OF SYSTEMS:  She denies any shortness of breath or chest pain today, but does have shortness of breath with minimal exertion and is not very ambulatory due to easy  fatigability.  HEENT:  Negative.  GI: She denies any recent GI bleeding, nausea, or vomiting.  NEUROLOGIC: She has no history of TIA, amaurosis, or stroke.  GENERAL:  She has had no recent fever, chills, or weight loss.  RESPIRATORY:  She denies any history of wheezing.  She does have a history of mild COPD, but has not had complications from this recently.  MUSCULOSKELETAL:  She has no significant major joint deformities and does not really complain of arthritis.  PSYCHIATRIC:  She is alert and oriented and has no previous psychiatric disorders.  PHYSICAL EXAMINATION:  VITAL SIGNS:  Blood pressure is 96/68, heart rate is 100 and irregular, respirations 16. HEENT:  Unremarkable.  She has 2+ carotid pulses bilaterally.  She has a well-healed left lower neck scar from a previous trauma. CHEST:  Clear to auscultation.  There is a left-sided AICD in place. CARDIAC:  Irregularly irregular. ABDOMEN:  Slightly obese, soft, nontender, nondistended.  No masses. EXTREMITIES:  She has no obvious major joint deformity. PERIPHERAL VASCULAR EXAM:  She has a 2+ left femoral pulse.  She has a 2+ left dorsalis pedis pulse.  She  has absent femoral and pedal pulses in the right leg.  The right foot is cool compared to the left. NEUROLOGIC:  She is able to dorsiflex and plantar flex both ankles and move her toes.  She does have decreased sensation to light touch in the right foot. SKIN:  She has no open ulcers or rashes.  The skin is cool in the right foot.  LABORATORY DATA:  INR today was 1.5.  Hemoglobin 12.5, white blood cell count 5.9, platelets 183.  Troponin yesterday was 0.1.  ASSESSMENT:  Acute ischemia right lower extremity and most likely cardioembolic.  PLAN:  We will place the patient on heparin drip now.  She will be transferred to [facility] for embolectomy later today.  I will discuss with Dr. Shana Chute her transfer.  She will remain n.p.o.  Her last meal was this morning at  around 9 a.m.     Janetta Hora. Fields, MD     CEF/MEDQ  D:  06/20/2010  T:  06/21/2010  Job:  [id number]  Electronically Signed by Fabienne Bruns MD on 07/03/2010 11:06:44 AM

## 2010-07-03 NOTE — Op Note (Signed)
Jessica [name], Jessica [name]             ACCOUNT NO.:  [id number]  MEDICAL RECORD NO.:  [id number]           PATIENT TYPE:  I  LOCATION:  2313                         FACILITY:  [facility]  PHYSICIAN:  Jessica Hora. Natika Geyer, MD  DATE OF BIRTH:  06-29-1960  DATE OF PROCEDURE:  06/20/2010 DATE OF DISCHARGE:                              OPERATIVE REPORT   PROCEDURES: 1. Right iliofemoral embolectomy. 2. Four-compartment fasciotomy, right leg.  PREOPERATIVE DIAGNOSIS:  Acute ischemia, right leg.  POSTOPERATIVE DIAGNOSIS:  Acute ischemia, right leg.  ANESTHESIA:  General.  ASSISTANT:  Jessica Goo, PA-C  OPERATIVE FINDINGS:  Iliofemoral embolus, right leg.  SPECIMENS: 1. Right groin lymph node. 2. Right iliofemoral embolus.  OPERATIVE DETAILS:  After obtaining informed consent, the patient was taken to the operating room.  The patient was placed in a supine position on the operating table.  After induction of general anesthesia and endotracheal intubation, the patient's entire right lower extremity was prepped and draped in usual sterile fashion.  Next, a longitudinal incision was made in the right groin, carried down through the subcutaneous tissues down to the level of right common femoral artery. There was some adhesions to the medial and posterior wall of this and these were taken down sharply.  The common femoral artery was dissected free circumferentially and was soft in quality.  There was no pulse within it.  The profunda femoris and superficial femoral arteries were dissected free circumferentially and vessel loops placed around these. Several small venous tributaries were ligated to allow adequate exposure of the artery.  The patient was given 5000 units of intravenous heparin as a bolus.  After 2 minutes of circulation time, the superficial femoral and profunda femoris arteries were controlled with vessel loops. The common femoral artery was controlled also with a vessel  loop.  A transverse arteriotomy was made in the common femoral artery just above the bifurcation.  On opening the artery, there was nothing within the lumen.  No blood or thrombus.  Next a #4 Fogarty catheter was brought up in the operative field.  This was used to thrombectomize the iliofemoral system on the right side. Large amount of thrombus was removed and there was some arterial inflow. #3 and #5 Fogarty catheters were also passed to make sure that all the thrombus had been cleared and there was brisk arterial inflow.  I was only able to pass the catheter up 30 cm, but the arterial inflow was similar to systemic pressure, so I felt that all the embolus had been cleared at this point.  Common femoral artery was occluded proximally with a vessel loop.  I then proceeded to pass #3 and #4 Fogarty catheters down the superficial femoral artery.  Multiple passes were made, on the first pass, there was a small amount of thrombus retrieved and after that 2 clean passes were obtained and there was good backbleeding from the superficial femoral artery.  This was thoroughly flushed with heparinized saline.  In a similar fashion, 3 and 4 Fogarty catheters were passed down 2 large profunda branches.  There was also good backbleeding from these and 2 clean passes were  also obtained. These were re-controlled with vessel loops.  Vessel loop was then removed from the common femoral artery and this was controlled proximally with a small Cooley clamp.  The arteriotomy was then repaired using a running 6-0 Prolene suture.  Just prior to completion anastomosis, this was fore bled, back bled, and thoroughly flushed. Anastomosis was secured, clamps were released, there was pulsatile flow in the common femoral artery, profunda femoris, and superficial femoral arteries immediately.  This was packed with a sponge and attention was turned to the lower leg.  The patient's symptoms had begun at approximately 1  p.m. yesterday, so I felt that she should have fasciotomies to prevent compartment syndrome.  Therefore, a longitudinal incision was made on the lateral aspect of the right leg and incision was carried down through the subcutaneous tissues down the level of the fascia and the anterior and lateral compartments were decompressed fully through this incision.  There was some bolus of the anterior compartment.  Attention was then turned to the medial aspect of the leg. A longitudinal incision was made on the medial aspect of the leg, carried down through subcutaneous tissue.  The greater saphenous vein was identified and retracted laterally.  Incision was deepened down into the posterior compartment and the posterior deep compartment was decompressed bluntly through this incision.  There was minimal muscle edema on the medial aspect of the leg.  These wounds were then packed with gauze and covered with an Ace wrap.  The patient's foot was examined and had a triphasic posterior tibial Doppler signal and a monophasic to biphasic dorsalis pedis Doppler signal.  She had no Doppler signals prior to the case.  At this point, it was felt that the leg had been adequately reperfused.  The groin was then closed in multiple layers using running 2-0, 3-0, and 4-0 Vicryl subcuticular stitch in the skin.  A 10 flat JP drain was placed at the base of the groin incision and brought out through a separate stab incision and sutured to skin with nylon stitch.  The patient tolerated the procedure well and there were no complications.  Instrument, sponge, and needle counts were correct at the end of the case.  The patient was taken to the recovery room in a stable condition.     Jessica Hora. Timmie Calix, MD     CEF/MEDQ  D:  06/20/2010  T:  06/21/2010  Job:  [id number]  cc:   Jessica [name], M.D. Jessica [name], M.D.  Electronically Signed by Fabienne Bruns MD on 07/03/2010 11:06:47 AM

## 2010-07-03 NOTE — Procedures (Unsigned)
LOWER EXTREMITY ARTERIAL DUPLEX  INDICATION:  Followup right lower extremity embolectomy and fasciotomy.  HISTORY: Diabetes:  Yes. Cardiac:  Yes. Hypertension:  Yes. Smoking:  Yes. Previous Surgery:  Yes.  SINGLE LEVEL ARTERIAL EXAM                         RIGHT                LEFT Brachial: Anterior tibial: Posterior tibial: Peroneal: Ankle/Brachial Index:  ABI not performed due to known DVT in left lower extremity.  LOWER EXTREMITY ARTERIAL DUPLEX EXAM  DUPLEX:  Patent right lower extremity arteries with triphasic waveforms observed throughout the leg.  There are multiple enlarged and vascularized lymph nodes in the right groin.  The right common femoral vein is compressible and pulsatile with normal augmentation.  No evidence of pseudoaneurysm.  IMPRESSION:  Patent right lower extremity arterial system, however, infection is suspected.  Results were communicated to Dr. Myra Gianotti who prescribed antibiotics and instructed the patient to return to see Dr. Darrick Penna later this week.       ___________________________________________ Janetta Hora. Fields, MD  LT/MEDQ  D:  07/03/2010  T:  07/03/2010  Job:  [id number]

## 2010-07-03 NOTE — Discharge Summary (Addendum)
Jessica [name], READER             ACCOUNT NO.:  [id number]  MEDICAL RECORD NO.:  [id number]           PATIENT TYPE:  I  LOCATION:  3311                         FACILITY:  [facility]  PHYSICIAN:  Janetta Hora. Fields, MD  DATE OF BIRTH:  04/02/1960  DATE OF ADMISSION:  06/20/2010 DATE OF DISCHARGE:  06/26/2010                              DISCHARGE SUMMARY   CHIEF COMPLAINT: 1. Acute ischemia, right leg. 2. DVT, left lower extremity.  HISTORY OF PRESENT ILLNESS:  Jessica [name] is a 50 year old female who was admitted to [facility] with right leg pain and collapse.  It occurred at 1 p.m. on the day before her admission.  She had numbness and tingling in the right foot and decreased motor function.  Motor function in the right foot was improved, but she still had numbness and tingling and coolness in that leg.  She also noted was noted to have a left lower extremity DVT for ultrasound.  She stated that she hurts in the right leg and the hip all the way down to the foot and has known history of "irregular heartbeat."  She has been on Coumadin in the past.  She was started on therapeutic Lovenox and Coumadin yesterday for DVT.  She also has a history of myocardial infarction, congestive heart failure, and AICD.  PAST MEDICAL HISTORY: 1. Coronary artery disease, multiple prior coronary stents, myocardial     infarction, and left-sided AICD. 2. Hypertension. 3. Hyperlipidemia. 4. Diabetes.  ALLERGIES:  ALTACE and LATEX.  It was felt that the acute ischemia to the right lower extremity was most likely cardioembolic.  She was placed on a heparin drip and transferred to [facility] for definitive treatment in the operating room.  The patient was taken to the operating room on June 20, 2010 for a right iliofemoral embolectomy and for compartment fasciotomy of the right leg.  Postoperatively, the patient did well.  Her wounds were healing well and JP drain was removed.  On the second  postoperative day, her fasciotomies were closed.  On June 21, 2010, she had good Doppler signal in both feet.  Her left calf was soft as well.  All the wounds were healing well.  She was ambulating, voiding, taking p.o.  She was started on Coumadin with INRs check daily and she had 3 days of INRs greater than 2.  Heparin drip was stopped on April 16th and she was discharged to home the patient be followed up by Dr. Gala Romney in [facility].  Discharge medications include oxycodone 5 mg 1-2 tabs every 4 hours as needed for pain, 30 tabs were given; metolazone 5 mg as needed.  She has continued her spironolactone, her glimepiride 2 mg daily, Protonix 40 mg daily, Crestor 40 mg daily.  FINAL DIAGNOSES: 1. Embolectomy, iliofemoral femoral artery on the right; 4-compartment     fasciotomy, right leg. 2. Left lower extremity deep vein thrombosis treated with heparin and     Coumadin. 3. Arrhythmia.  She will be treated with her previous medications,     hypotension managed by Cardiology and her blood pressure     medications  were discontinued.  DISPOSITION:  She will have her Coumadin levels, PT/INR checked on Wednesday, June 28, 2010 and then followed weekly by [facility], Dr. Gala Romney.  She will follow up with Dr. Shana Chute in a few weeks.  She will be seen by Dr. Darrick Penna in 2 weeks for staple removal.     Della Goo, PA-C   ______________________________ Janetta Hora. Fields, MD    RR/MEDQ  D:  06/26/2010  T:  06/27/2010  Job:  [id number]  Electronically Signed by Fabienne Bruns MD on 07/03/2010 11:06:39 AM Electronically Signed by Della Goo PA on 07/03/2010 02:10:41 PM

## 2010-07-06 ENCOUNTER — Ambulatory Visit (INDEPENDENT_AMBULATORY_CARE_PROVIDER_SITE_OTHER): Payer: Medicare Other | Admitting: Vascular Surgery

## 2010-07-06 ENCOUNTER — Ambulatory Visit: Payer: Medicare Other | Admitting: Vascular Surgery

## 2010-07-06 ENCOUNTER — Encounter (INDEPENDENT_AMBULATORY_CARE_PROVIDER_SITE_OTHER): Payer: Medicare Other

## 2010-07-06 ENCOUNTER — Ambulatory Visit (INDEPENDENT_AMBULATORY_CARE_PROVIDER_SITE_OTHER): Payer: Medicare Other | Admitting: Internal Medicine

## 2010-07-06 ENCOUNTER — Encounter: Payer: Self-pay | Admitting: Internal Medicine

## 2010-07-06 VITALS — BP 86/52 | HR 104 | Resp 12 | Wt 176.0 lb

## 2010-07-06 DIAGNOSIS — I5022 Chronic systolic (congestive) heart failure: Secondary | ICD-10-CM

## 2010-07-06 DIAGNOSIS — I824Z9 Acute embolism and thrombosis of unspecified deep veins of unspecified distal lower extremity: Secondary | ICD-10-CM

## 2010-07-06 DIAGNOSIS — R0989 Other specified symptoms and signs involving the circulatory and respiratory systems: Secondary | ICD-10-CM

## 2010-07-06 DIAGNOSIS — I2510 Atherosclerotic heart disease of native coronary artery without angina pectoris: Secondary | ICD-10-CM

## 2010-07-06 DIAGNOSIS — F17200 Nicotine dependence, unspecified, uncomplicated: Secondary | ICD-10-CM

## 2010-07-06 DIAGNOSIS — I743 Embolism and thrombosis of arteries of the lower extremities: Secondary | ICD-10-CM

## 2010-07-06 MED ORDER — METOPROLOL SUCCINATE ER 25 MG PO TB24: 25.0000 mg | ORAL_TABLET | Freq: Every day | ORAL | Status: DC

## 2010-07-06 MED ORDER — ROSUVASTATIN CALCIUM 40 MG PO TABS: 40.0000 mg | ORAL_TABLET | Freq: Every day | ORAL | Status: DC

## 2010-07-06 NOTE — Assessment & Plan Note (Signed)
Volume status looks good. But is off all her medications and is tachycardic with low BP. Will attempt to reintroduce Toprol 25 at night. See back in 2 weeks to try and titrate meds further.

## 2010-07-06 NOTE — Assessment & Plan Note (Signed)
Continue coumadin.  

## 2010-07-06 NOTE — Progress Notes (Signed)
HPI:  Jessica [name] is a 50 year old woman with a history of severe congestive heart failure, secondary ischemic cardiomyopathy with an ejection fraction of 25-30%. She also has a history of coronary artery disease, status post multiple interventions and stents, diabetes, hyperlipidemia, history of ongoing tobacco and possibly alcohol use.  She has been followed in the heart failure clinic and has had a problem with noncompliance.   Inolerant of ACE-I due to severe cough.  Had stress test in 09/2009 for CP.  EF:  25 %. LV markedly dilated. 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 No ischemia. ICD shock in Nov 2011.  Here for routine f/u. Admitted 2 weeks ago for acute RLE ischemia. Taken to the operating room on June 20, 2010 for a  right iliofemoral embolectomy and for compartment fasciotomy of the right leg by Dr. Darrick Penna. Also found to have LLE DVT. Feels sluggish. BP low.  No edema in LLE. No orthopnea or PND. No CP.  Diovan, Toprol and lasix held at discharge.   Still smoking 2 cigs/day    ROS: All systems negative except as listed in HPI, PMH and Problem List.  No past medical history on file.  Current Outpatient Prescriptions  Medication Sig Dispense Refill  . cephALEXin (KEFLEX) 500 MG capsule Take 500 mg by mouth 3 (three) times daily.        [name] glimepiride (AMARYL) 2 MG tablet Take 2 mg by mouth daily before breakfast.        . HYDROcodone-acetaminophen (LORTAB 5) 5-500 MG per tablet Take 1 tablet by mouth every 6 (six) hours as needed.        . metolazone (ZAROXOLYN) 2.5 MG tablet Take 2.5 mg by mouth as needed.        . pantoprazole (PROTONIX) 40 MG tablet Take 40 mg by mouth daily.        . potassium chloride SA (K-DUR,KLOR-CON) 20 MEQ tablet Take 20 mEq by mouth daily.        . rosuvastatin (CRESTOR) 40 MG tablet Take 40 mg by mouth daily.        [name] spironolactone (ALDACTONE) 25 MG tablet Take 25 mg by  mouth daily.        [name] warfarin (COUMADIN) 2 MG tablet As directed          PHYSICAL EXAM: Filed Vitals:   07/06/10 1201  BP: 86/52  Pulse: 104  Resp: 12   General: Sitting in W-C. Fatigued. No resp distress HEENT: normal except for poor dentition Neck: supple. JVP 7. Carotids 2+ bilaterally; no bruits. No lymphadenopathy or thryomegaly appreciated. Cor: PMI lat displaced. Tachy reg +s3. 2/6 sem lsb Lungs: clear Abdomen: obese. soft, nontender, nondistended. No hepatosplenomegaly. No bruits or masses. Good bowel sounds. Extremities: no cyanosis, clubbing, diffuse severe edema R leg with well appearing wound. Staples present. No edema on L Neuro: alert & orientedx3, cranial nerves grossly intact. Moves all 4 extremities w/o difficulty. Affect pleasant.    ECG:   ASSESSMENT & PLAN:

## 2010-07-06 NOTE — Patient Instructions (Signed)
Start Metoprolol 25mg  every evening Your physician recommends that you schedule a follow-up appointment in: 2 weeks

## 2010-07-06 NOTE — Assessment & Plan Note (Signed)
Counseled on need to stop smoking completely. 

## 2010-07-06 NOTE — Assessment & Plan Note (Signed)
No evidence of ischemia. Continue current regimen.   

## 2010-07-07 ENCOUNTER — Ambulatory Visit: Payer: Medicare Other | Admitting: Internal Medicine

## 2010-07-07 NOTE — Assessment & Plan Note (Signed)
OFFICE VISIT  Jessica [name], Jessica [name] DOB:  22-May-1960                                       07/06/2010 CHART#:[id number]  The patient returns for followup today.  The patient previously had right iliofemoral embolectomy and four compartment fasciotomy in the right leg on 06/20/2010.  She returns today for further followup.  She developed leg swelling in the right leg after being discharged from the hospital and actually had a venous and arterial duplex performed on April 23.  This showed triphasic waveforms throughout the entire right leg artery system as well as no evidence of right femoral DVT.  She did have some engorgement of lymph nodes in the right leg.  She was started on Keflex at that time.  She denies any fever or chills.  She denies any drainage from her incisions.  PHYSICAL EXAM:  Vital signs:  Today blood pressure is 94/61 in the left arm, heart rate 109 and regular.  Temperature is 98.  Right lower extremity is diffusely swollen from the right hip all the way to the foot.  It is approximately 25%-30% larger than the left side.  The foot is pink, warm and well-perfused.  Pulses are not palpable but she has diffuse swelling.  She states that the swelling has actually improved somewhat over the last few days.  She still have staples intact from her previous fasciotomies in the lateral and medial calf.  Overall I think the patient is improved.  Her swelling has gotten better over the last few days.  Hopefully this will continue to improve over the next few weeks.  Some of this is probably reperfusion related, some of it is probably related to her operation itself.  She does not seem to have anything worrisome on duplex exam, specifically her arterial and venous system is patent.  She is also on Coumadin for a left leg DVT and states that her levels are being followed closely although they were fairly high and the Coumadin had been held for a few days.   She will follow up with me in 2 weeks' time.  At time hopefully the leg swelling will have subsided enough that we can consider removing the staples from her fasciotomy sites.    [name], MD Electronically Signed  CEF/MEDQ  D:  07/06/2010  T:  07/07/2010  Job:  [id number]  cc:   Osvaldo Shipper. Spruill, M.D.

## 2010-07-10 ENCOUNTER — Ambulatory Visit (INDEPENDENT_AMBULATORY_CARE_PROVIDER_SITE_OTHER): Payer: Medicare Other | Admitting: *Deleted

## 2010-07-10 DIAGNOSIS — I824Z9 Acute embolism and thrombosis of unspecified deep veins of unspecified distal lower extremity: Secondary | ICD-10-CM

## 2010-07-10 NOTE — Patient Instructions (Signed)
INR 3.4 Skip today's dosage of Coumadin, then start taking 1 tablet daily except 1/2 tablet on Tuesdays, Thursday, and Saturdays.   Recheck in 1 week.

## 2010-07-13 ENCOUNTER — Encounter: Payer: Self-pay | Admitting: Internal Medicine

## 2010-07-13 ENCOUNTER — Encounter (INDEPENDENT_AMBULATORY_CARE_PROVIDER_SITE_OTHER): Payer: Medicare Other

## 2010-07-13 DIAGNOSIS — M7989 Other specified soft tissue disorders: Secondary | ICD-10-CM

## 2010-07-13 DIAGNOSIS — M79609 Pain in unspecified limb: Secondary | ICD-10-CM

## 2010-07-13 DIAGNOSIS — Z48812 Encounter for surgical aftercare following surgery on the circulatory system: Secondary | ICD-10-CM

## 2010-07-15 ENCOUNTER — Encounter: Payer: Self-pay | Admitting: Internal Medicine

## 2010-07-18 NOTE — Procedures (Unsigned)
DUPLEX DEEP VENOUS EXAM - LOWER EXTREMITY  INDICATION:  Increased pain and swelling in the right lower extremity.  HISTORY:  Edema:  Yes. Trauma/Surgery:  Fasciotomy subsequent to right iliofemoral thrombectomy. Pain:  Yes. PE: Previous DVT:  Yes. Anticoagulants:  Yes. Other:  DUPLEX EXAM:               CFV   SFV   PopV  PTV    GSV               R  L  R  L  R  L  R   L  R  L Thrombosis    o  o  o     o     o      o Spontaneous   +  +  +     +     +      + Phasic        +  +  +     +     +      + Augmentation  +  +  +     +     +      + Compressible  +  +  +     +     +      + Competent  Legend:  + - yes  o - no  p - partial  D - decreased  IMPRESSION:  No evidence of deep venous thrombosis or superficial venous thrombosis in the right lower extremity.  Pulsatility observed bilaterally.  Gross calf edema observed on the right side.  There are enlarged lymph nodes on the groin that appear to have decreased in size since prior examination.  There is a fluid collection in the groin measuring approximately 1.1 cm X 1.8 cm.   _____________________________ Janetta Hora. Fields, MD  LT/MEDQ  D:  07/13/2010  T:  07/14/2010  Job:  [id number]

## 2010-07-19 ENCOUNTER — Ambulatory Visit (INDEPENDENT_AMBULATORY_CARE_PROVIDER_SITE_OTHER): Payer: Medicare Other | Admitting: Internal Medicine

## 2010-07-19 ENCOUNTER — Encounter: Payer: Self-pay | Admitting: Internal Medicine

## 2010-07-19 ENCOUNTER — Ambulatory Visit (INDEPENDENT_AMBULATORY_CARE_PROVIDER_SITE_OTHER): Payer: Medicare Other | Admitting: *Deleted

## 2010-07-19 VITALS — BP 98/77 | HR 98 | Ht 62.0 in | Wt 163.1 lb

## 2010-07-19 DIAGNOSIS — I2510 Atherosclerotic heart disease of native coronary artery without angina pectoris: Secondary | ICD-10-CM

## 2010-07-19 DIAGNOSIS — I5022 Chronic systolic (congestive) heart failure: Secondary | ICD-10-CM

## 2010-07-19 DIAGNOSIS — I824Z9 Acute embolism and thrombosis of unspecified deep veins of unspecified distal lower extremity: Secondary | ICD-10-CM

## 2010-07-19 LAB — CBC WITH DIFFERENTIAL/PLATELET
Basophils Relative: 0.6 % (ref 0.0–3.0)
Eosinophils Absolute: 0 10*3/uL (ref 0.0–0.7)
MCHC: 33 g/dL (ref 30.0–36.0)
MCV: 79.4 fl (ref 78.0–100.0)
Monocytes Absolute: 0.3 10*3/uL (ref 0.1–1.0)
Neutrophils Relative %: 66.9 % (ref 43.0–77.0)
Platelets: 256 10*3/uL (ref 150.0–400.0)

## 2010-07-19 LAB — BASIC METABOLIC PANEL
CO2: 32 mEq/L (ref 19–32)
Calcium: 8.9 mg/dL (ref 8.4–10.5)
Creatinine, Ser: 0.8 mg/dL (ref 0.4–1.2)
Glucose, Bld: 306 mg/dL — ABNORMAL HIGH (ref 70–99)

## 2010-07-19 MED ORDER — FUROSEMIDE 40 MG PO TABS: 40.0000 mg | ORAL_TABLET | Freq: Every day | ORAL | Status: DC

## 2010-07-19 NOTE — Assessment & Plan Note (Signed)
Continue coumadin.  

## 2010-07-19 NOTE — Assessment & Plan Note (Signed)
Volume status looks OK. BP remains low but will try to start HF meds back slowly. Asked her again to restart Toprol 25 qd. Will resum lasix 40 qd and can titrate to 80 qd as needed. Try to minimize metolazone. Check labs today. Would like to take her back on low dose Diovan again soon.

## 2010-07-19 NOTE — Progress Notes (Signed)
HPI:  Jessica [name] is a 50 year old woman with a history of severe congestive heart failure, secondary ischemic cardiomyopathy with an ejection fraction of 25-30%. She also has a history of coronary artery disease, status post multiple interventions and stents, diabetes, hyperlipidemia, history of ongoing tobacco and possibly alcohol use.  She has been followed in the heart failure clinic and has had a problem with noncompliance.   Inolerant of ACE-I due to severe cough.  Had stress test in 09/2009 for CP.  EF:  25 %. LV markedly dilated. 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 No ischemia. ICD shock in Nov 2011.  On June 20, 2010 had a  right iliofemoral embolectomy and for compartment fasciotomy of the right leg by Dr. Darrick Penna. Also found to have LLE DVT. Diovan, Toprol and lasix held at discharge. At last visit we restarted metoprolol but hasnt gotten it yet.  Returns for f/u. Leg feels like it is getting better but still throbs and aches. Hard time controlling it. Breathing OK but unable to do much. Still not back on lasix but weight down 20 pounds. Taking metolazone every 2 days.   No edema in LLE. No orthopnea or PND. No CP.  Still smoking 2 cigs/day    ROS: All systems negative except as listed in HPI, PMH and Problem List.  Past Medical History  Diagnosis Date  . Congestive heart failure     secondary to severe ischemic cardiomyopathy with EF 20-25% s/p rimplantation of Biotronik single chamber ICD by Dr Ladona Ridgel 02/2008, Right heart cath 04/2007: RA 18, PA 56/30 (40), PCWP 36. FICK 3.89/2.06 [place]  . CAD (coronary artery disease)     s/p multiple PCIs  . Tobacco abuse   . Alcohol abuse   . History of noncompliance with medical treatment   . Diabetes mellitus, type 2   . Dizziness     Current Outpatient Prescriptions  Medication Sig Dispense Refill  . glimepiride (AMARYL) 2 MG tablet Take 2 mg by mouth daily  before breakfast.        . HYDROcodone-acetaminophen (LORTAB 5) 5-500 MG per tablet Take 1 tablet by mouth every 6 (six) hours as needed.        . metolazone (ZAROXOLYN) 2.5 MG tablet Take 2.5 mg by mouth as needed.        . metoprolol succinate (TOPROL XL) 25 MG 24 hr tablet Take 1 tablet (25 mg total) by mouth daily.  30 tablet  11  . pantoprazole (PROTONIX) 40 MG tablet Take 40 mg by mouth daily.        . potassium chloride SA (K-DUR,KLOR-CON) 20 MEQ tablet Take 20 mEq by mouth daily.        . rosuvastatin (CRESTOR) 40 MG tablet Take 1 tablet (40 mg total) by mouth daily.  30 tablet  6  . spironolactone (ALDACTONE) 25 MG tablet Take 25 mg by mouth daily.        [name] warfarin (COUMADIN) 2 MG tablet As directed       . DISCONTD: aspirin 81 MG tablet Take 81 mg by mouth daily.        [name] DISCONTD: cephALEXin (KEFLEX) 500 MG capsule Take 500 mg by mouth 3 (three) times daily.        [name] DISCONTD: digoxin (LANOXIN) 0.25 MG tablet Take 250 mcg by mouth daily.        [name] DISCONTD: furosemide (LASIX) 80 MG tablet Take  80 mg by mouth 2 (two) times daily.        [name] DISCONTD: valsartan (DIOVAN) 160 MG tablet Take 160 mg by mouth 2 (two) times daily.           PHYSICAL EXAM: Filed Vitals:   07/19/10 1244  BP: 98/77  Pulse: 98   General: Sitting in W-C. Fatigued. No resp distress HEENT: normal except for poor dentition Neck: supple. JVP flar  Carotids 2+ bilaterally; no bruits. No lymphadenopathy or thryomegaly appreciated. Cor: PMI lat displaced. Tachy reg +s3. 2/6 sem lsb Lungs: clear Abdomen: obese. soft, nontender, nondistended. No hepatosplenomegaly. No bruits or masses. Good bowel sounds. Extremities: no cyanosis, clubbing, diffuse severe edema R leg with well appearing wound. Staples present. No edema on L Neuro: alert & orientedx3, cranial nerves grossly intact. Moves all 4 extremities w/o difficulty. Affect pleasant.    ECG: SR 97 with PVCs. LAFB. Prolonged QT.    ASSESSMENT & PLAN:

## 2010-07-19 NOTE — Assessment & Plan Note (Signed)
No evidence of ischemia. Continue current regimen.   

## 2010-07-19 NOTE — Patient Instructions (Signed)
Your physician recommends that you schedule a follow-up appointment in: 1 month with Dr. Gala Romney Your physician recommends that you have lab work today: cbc,bnp,bmp Your physician has recommended you make the following change in your medication: restart toprol, start: lasix

## 2010-07-20 ENCOUNTER — Ambulatory Visit (INDEPENDENT_AMBULATORY_CARE_PROVIDER_SITE_OTHER): Payer: Medicare Other | Admitting: Vascular Surgery

## 2010-07-20 DIAGNOSIS — I7092 Chronic total occlusion of artery of the extremities: Secondary | ICD-10-CM

## 2010-07-21 NOTE — Assessment & Plan Note (Signed)
OFFICE VISIT  Jessica [name], Jessica [name] DOB:  January 14, 1961                                       07/20/2010 CHART#:[id number]  The patient returns for followup today.  She had a right iliofemoral embolectomy on 06/20/2010.  We have been following her for some leg swelling and discomfort in her previous fasciotomy site.  She returns today for further followup.  She states she still has some pain and swelling in the right leg.  She has had multiple scans which showed no evidence of DVT.  She is on Coumadin.  PHYSICAL EXAM:  Blood pressure is 92/69 in the right arm, heart rate is 106 and regular.  Temperature is 97.8.  Right leg has biphasic to triphasic dorsalis pedis and post tibial Doppler signals.  She does have approximately 15% more swelling in the right leg compared to the left. The fasciotomy incisions are healing well.  Every other staple was removed today.  She will return next week to have the remainder of her staples pulled and hopefully will be able to follow up on an as-needed basis at that point.    Janetta Hora. Fields, MD Electronically Signed  CEF/MEDQ  D:  07/20/2010  T:  07/21/2010  Job:  [id number]  cc:   Osvaldo Shipper. Spruill, M.D.

## 2010-07-25 NOTE — Assessment & Plan Note (Signed)
[facility]                            CARDIOLOGY OFFICE NOTE   Jessica [name], Jessica [name]                      MRN:          [id number]  DATE:07/16/2007                            DOB:          08/01/1960    PRIMARY CARE PHYSICIAN:  Osvaldo Shipper. Spruill, M.D.   INTERVAL HISTORY:  Jessica [name] is a delightful 50 year old woman with a  history of severe congestive heart failure, secondary ischemic  cardiomyopathy with an ejection fraction of 25-30%.  She was in the  hospital earlier this year with profound cardiogenic shock.  She also  has a history of coronary artery disease, status post multiple  interventions and stents, diabetes, hyperlipidemia, history of ongoing  tobacco and possibly alcohol use.  She has been followed in the heart  failure clinic by [name].  She returns today for routine  followup.   When she saw Marcelino Duster last week, she had evidence of volume overload and  her Lasix was increased.  There was some question of noncompliance.  She  says she is feeling some better, but does get short of breath with just  moderate activity.  She feels like her belly has been slightly bloated,  but no lower extremity edema.  She does have occasional orthopnea, no  chest pain.   CURRENT MEDICATIONS:  1. Coreg 3.125 in the morning and 6.25 at night.  2. Aspirin 81.  3. Plavix 75.  4. Crestor 20.  5. Amaryl 2 mg a day.  6. Atacand 4 a day.  7. Digoxin 0.125 a day.  8. Lasix 120 b.i.d.  9. Potassium 40 t.i.d.   PHYSICAL EXAMINATION:  GENERAL:  She is no acute distress.  Ambulates  around the clinic without any respiratory difficulty.  VITAL SIGNS:  Blood pressure is 94/62, heart rate 81, weight is 155.  HEENT:  Normal.  NECK:  Supple.  JVP is about 10 cm of water.  Carotids are 2+  bilaterally without bruits.  There is no lymphadenopathy or thyromegaly.  CARDIAC:  PMI is laterally displaced.  She is regular with an S4, no S3.  LUNGS:  Clear.  ABDOMEN:  Mildly distended, but not severely so.  There is no  hepatosplenomegaly, no bruits, no masses.  EXTREMITIES:  Warm with no cyanosis, clubbing or edema.  No rash.  NEURO:  Alert and oriented x3.  Cranial nerves II-XII are intact.  Moves  all 4 extremities without difficulty.   ASSESSMENT/PLAN:  1. Congestive heart failure secondary to ischemic cardiomyopathy.  She      remains NYH class III.  Blood pressure is borderline.  She does      have evidence of volume overload today despite high doses of Lasix.      At this point, I am going to go ahead and add spironolactone 25 mg      a day, cut back her potassium to b.i.d.  We will also get a CTX      test to further evaluate exactly where she is.  I remain quite      concerned about her and suspect at some point she  may need to be      considered for advanced therapies like transplant though she has      several comorbidities including tobacco use and diabetes that may      limit this.  We will bring her back next week if she continues to      have volume overload.  We will consider switching her either to      [facility] or adding low-dose Zaroxolyn.  Would also like to titrate      her ARB up as soon as possible.  Finally, we will have her      evaluated for a possible  pravem study.  2. Coronary artery disease is stable.  Continue current therapy.  3. Tobacco use.  Once again, I reminded her the need to absolutely      stop smoking.     Bevelyn Buckles. Bensimhon, MD  Electronically Signed    DRB/MedQ  DD: 07/16/2007  DT: 07/16/2007  Job #: [id number]

## 2010-07-25 NOTE — Assessment & Plan Note (Signed)
[facility]                          CHRONIC HEART FAILURE NOTE   Jessica [name], Jessica [name]                      MRN:          [id number]  DATE:07/31/2007                            DOB:          1960/04/05    PRIMARY CARE PHYSICIAN:  Dr. [name].   PRIMARY CARDIOLOGIST:  Dr. [name].   HISTORY OF PRESENT ILLNESS:  Jessica [name] returns today for follow-up of her  congestive heart failure which is secondary to ischemic cardiomyopathy.  I have continued to watch Jessica [name] closely as she has significant class  III symptoms with volume overload.  She returns today after I started  her on spironolactone 25 mg daily with a 6-pound weight loss since I  last saw her on the 12th.  She also has not had any alcohol (beer) since  Mother's Day.  She states she has only had one cigarette today.  She  states she is breathing much better, but abdominal swelling is not  nearly as severe as it was that she can actually put her bra on.  She is  still having bouts of coughing and dyspnea with exertion, but feels like  she is starting to improve some.  Also, most recent lab work checked on  the 14th.  Potassium 3.7, BUN and creatinine 8 and 0.8.  BMP 794.   PAST MEDICAL HISTORY:  1. Congestive heart failure secondary to ischemic cardiomyopathy with      EF 25-30%.  2. Coronary artery disease status post multiple intervention stents.  3. Ongoing polysubstance use including EtOH and tobacco.  4. Dyslipidemia.  5. Type 2 diabetes.  6. History of medical noncompliance.   REVIEW OF SYSTEMS:  As stated above, otherwise negative.   CURRENT MEDICATIONS:  1. Coreg 3.125 mg in the morning and 6.25 in the evening.  2. Aspirin 81 mg daily.  3. Plavix 75 daily.  4. Crestor 20 daily.  5. Amaryl 2 mg daily.  6. Atacand 4 mg daily.  7. Digoxin 0.125 daily.  8. Lasix 120 mg b.i.d.  9. K-Dur 40 mEq b.i.d.  10.Spironolactone 25 mg daily.  11.K-Dur 40 mEq b.i.d.   PHYSICAL  EXAMINATION:  VITAL SIGNS:  Weight 153 pounds, blood pressure  111/80 with a heart rate of 90.  Jessica [name] is in no acute distress.  No  signs of jugular vein distention at 45 degrees angle.  LUNGS:  Clear to auscultation bilaterally.  CARDIOVASCULAR:  Reveals S1-S2, regular rate and rhythm.  I do not hear  S3 at this time.  ABDOMEN:  Soft, nontender, positive bowel sounds.  Mildly distended.  Lower extremities without clubbing, cyanosis or edema.  NEUROLOGICAL:  Alert and oriented x3.   IMPRESSION:  Congestive heart failure secondary to ischemic  cardiomyopathy with EF 25%.  The patient was mild volume overloaded at  this time, but much improvement in symptoms and clinical findings.  Will  continue Lasix at 120 mg b.i.d., spironolactone 25, increased to Coreg  6.25 mg b.i.d., continue Atacand at 4 mg for now.  I will see Jessica [name]  back in 3 weeks.  I congratulated her on her  decreased EtOH use and her  decrease of tobacco use.  Once again, I have strongly encouraged her to  give up both habits in the setting of her multiple medical problems.      [name], ACNP  Electronically Signed      Rollene Rotunda, MD, [facility]  Electronically Signed   MB/MedQ  DD: 07/31/2007  DT: 07/31/2007  Job #: [id number]   cc:   Osvaldo Shipper. Spruill, M.D.

## 2010-07-25 NOTE — Assessment & Plan Note (Signed)
Jessica [name]                         ELECTROPHYSIOLOGY OFFICE NOTE   Jessica [name], Jessica [name]                      MRN:          [id number]  DATE:12/17/2007                            DOB:          Nov 18, 1960    Jessica [name] is referred today by Dr. Gala Romney for consideration for  prophylactic ICD insertion.  The patient is a very pleasant 50 year old  woman with a history of ischemic cardiomyopathy status post myocardial  infarction occurring 12 years ago.  She has a strong family history for  coronary artery disease, ongoing tobacco abuse at the time, and status  post infarction.  She has developed worsening congestive heart failure  symptoms over the years and now has class II-III.  A right heart  catheterization in February demonstrated secondary pulmonary  hypertension and elevated wedge pressures.  In the past, she has been on  milrinone to help with diuresis at least acutely.  She has been followed  in our Heart Failure Clinic now for over a year.   CURRENT MEDICATIONS:  1. Lasix 80 mg twice daily.  2. Atacand 8 mg nightly.  3. Aspirin 81 mg daily.  4. Plavix 75 a day.  5. Glyburide 2 mg a day.  6. Digoxin 0.25 daily.  7. Crestor 40 a day.  8. Aldactone 12.5 daily.  9. Potassium 20 mEq 2 tablets in the morning and 1 tablet in the      evening.  10.Coreg 3.125 twice daily.  11.She is on Lasix 80 mg twice a day.   PAST MEDICAL HISTORY:  Notable for diabetes and dyslipidemia.  She  continues to smoke cigarettes, although very minimally.  She denies  alcohol abuse.   REVIEW OF SYSTEMS:  Notable for fatigue and weakness.  Otherwise all  systems were reviewed and negative, except as noted above.   PHYSICAL EXAMINATION:  GENERAL:  The patient is a pleasant middle-aged  woman in no acute distress.  VITAL SIGNS:  Blood pressure was 102/70; the pulse was 80 and regular;  respirations were 18; and the weight was 165 pounds, which is down 4  pounds  from her visit a week ago.  HEENT:  Normocephalic and atraumatic.  Pupils are equal and round.  Oropharynx is moist.  Sclerae anicteric.  NECK:  A 7-cm jugular venous distention.  There are no thyromegaly.  Trachea is midline.  Carotids are 2+ and symmetric.  LUNGS:  Clear bilaterally to auscultation.  No wheezes, rales, or  rhonchi are present.  There is no increased work of breathing.  CARDIOVASCULAR:  Regular rate and rhythm.  Normal S1 and S2.  There is a  soft S4 gallop present.  The PMI was enlarged and laterally displaced,  and did not appreciate any murmurs today.  ABDOMEN:  Soft, nontender, and nondistended.  There is no organomegaly.  The bowel sounds are present.  There is no rebound or guarding.  EXTREMITIES:  No cyanosis, clubbing, or edema.  Pulses are 2+ and  symmetric.  NEUROLOGIC:  Alert and oriented x3.  Cranial nerves are intact.  Strength is 5/5 and symmetric.   Her EKG demonstrates  sinus rhythm with a QRS duration of 90 msec.  She  has right atrial enlargement.  Her QT is slightly prolonged.   IMPRESSION:  1. Ischemic cardiomyopathy with ejection fraction of 25%.  2. Congestive heart failure, class II to III with a narrow      electrocardiographic wave.  3. History of tobacco abuse.  4. Diabetes.  5. Dyslipidemia.   DISCUSSION:  Jessica [name] is stable from a heart failure perspective,  although she does have fairly marked congestive heart failure.  Her  symptoms are class II-III today.  She had a recent CPX test which  demonstrates 48% VO2 max of 11.7.  I have discussed the treatment  options with the patient.  The risks, benefits, goals, and expectations  of prophylactic ICD insertion have been discussed with the patient.  She  is considering her options and will call us if she would like to proceed  with ICD insertion.  We would want to have her stop her Plavix for a  couple of days prior to the defibrillator implant.     Doylene Canning. Ladona Ridgel, MD   Electronically Signed    GWT/MedQ  DD: 12/17/2007  DT: 12/17/2007  Job #: [id number]   cc:   Osvaldo Shipper. Spruill, M.D.  Bevelyn Buckles. Bensimhon, MD

## 2010-07-25 NOTE — Discharge Summary (Signed)
[name], Jessica             ACCOUNT NO.:  [id number]   MEDICAL RECORD NO.:  [id number]          PATIENT TYPE:  INP   LOCATION:  2040                         FACILITY:  [facility]   PHYSICIAN:  Doylene Canning. Ladona Ridgel, MD    DATE OF BIRTH:  Sep 06, 1960   DATE OF ADMISSION:  02/17/2008  DATE OF DISCHARGE:  02/18/2008                               DISCHARGE SUMMARY   ALLERGIES:  This patient has allergies to ALTACE and also to LATEX.   FINAL DIAGNOSIS:  Discharged at day #1, status post implant of a  BIOTRONIK, single-chamber cardioverter defibrillator.   SECONDARY DIAGNOSES:  1. Ischemic cardiomyopathy, ejection fraction of 20%-25%.  2. Coronary artery disease, status post multiple percutaneous      interventions in the past.  3. New York Heart Association Class IIIB chronic systolic congestive      heart failure.  4. Cardiopulmonary syndrome.  5. Dyslipidemia.  6. Ongoing ethanol/ongoing tobacco habituation.  7. Medical noncompliance.  8. Diabetes.   PROCEDURE:  February 17, 2008, implant of the single-chamber cardioverter  defibrillator Biotronik by Dr. Lewayne Bunting for dilated cardiomyopathy,  ejection fraction 25%, defibrillator threshold study less than or equal  to 20 joules.  The patient had no postprocedure complications  discharging postprocedure day #1.   BRIEF HISTORY:  Jessica [name] is a 50 year old female.  She has a history  of congestive heart failure, which was secondary to ischemic  cardiomyopathy.  Her New York Heart Association Class is II-III.  The  patient is a candidate for defibrillator implantation.  She has been  asked to stop her Plavix for several days prior to the implant.  She had  a weight gain of 8-9 pounds in November 2009 secondary to ethanol abuse  and also continuing tobacco as well as dietary indiscretion.  She has  medical issues.  She is not taking her Coreg twice a day.  She only  takes it at bedtime because she feels poorly.  She has not started  her  spironolactone at all and she has never increased her Lasix to the 80 mg  twice daily.  She takes 80 in the morning and 40 in the evening.  Her  repeat echocardiogram shows EF in the 20-25% range without improvement.   The patient will be referred to Dr. Lewayne Bunting for implantation of  ICD.  Medical compliance has been stressed.  The patient will discharge  with the admonition to keep her incision dry for the next 7 days until  Tuesday, February 24, 2008, she is to sponge bathe until then.   MEDICATIONS AT DISCHARGE:  1. Furosemide 80 mg twice daily.  2. Potassium chloride 20 mEq tablets 2 in the morning and 1 in the      evening.  3. Atacand 8 mg daily.  4. Plavix 75 mg daily.  5. Amaryl 2 mg daily.  6. Digoxin 0.25 mg daily.  7. Crestor 40 mg daily.  8. Spironolactone 25 mg daily.  9. Coreg 3.125 mg twice daily.  She is discharged with this in the      morning February 19, 2008.  10.New medication, Darvocet-N 100 1-2 tablets every 4-6 hours as      needed.   FOLLOWUP:  She follows up at [facility], [address]  to see Dr. Gala Romney, Tuesday, February 24, 2008, at 11:15.  She sees  Dr. Ladona Ridgel, Tuesday, May 25, 2008, at 3 o'clock.   LABORATORY STUDIES:  Pertinent to this admission were drawn on February 11, 2008, white cells 5.9, hemoglobin 15.1, hematocrit 44.1, and  platelets are 262.  Protime 11.2 and INR 1.0.  Sodium 139, potassium  4.2, chloride 102, carbonate 29, glucose 150, BUN is 18, and creatinine  is 1.      [name], PA      Doylene Canning. Ladona Ridgel, MD  Electronically Signed    GM/MEDQ  D:  02/18/2008  T:  02/18/2008  Job:  [id number]

## 2010-07-25 NOTE — Assessment & Plan Note (Signed)
[facility]                         ELECTROPHYSIOLOGY OFFICE NOTE   ETOILE, LOOMAN                      MRN:          [id number]  DATE:02/04/2008                            DOB:          01/30/1961    ADDENDUM:   IMPRESSION:  1. Ischemic and nonischemic cardiomyopathy.  2. Class II-III congestive heart failure with a narrow QRS.  3. Dyslipidemia.  4. Borderline diabetes.   DISCUSSION:  I have discussed the treatment options with Ms. Sias  including the risks, benefits, goals, and expectations of prophylactic  defibrillator insertion.  She would like to proceed with this.  It will  be scheduled at earliest possible convenient time.     Doylene Canning. Ladona Ridgel, MD     GWT/MedQ  DD: 02/04/2008  DT: 02/05/2008  Job #: [id number]

## 2010-07-25 NOTE — Assessment & Plan Note (Signed)
[facility]                          CHRONIC HEART FAILURE NOTE   Jessica [name]                      MRN:          [id number]  DATE:07/22/2007                            DOB:          December 12, 1960    PRIMARY CARE PHYSICIAN:  Dr. [name].   PRIMARY CARDIOLOGIST:  Dr. [name].   Jessica [name] returns today for follow-up of her congestive heart failure  status which is secondary to ischemic cardiomyopathy.  [name] just saw  Jessica [name] last week in follow-up of her ongoing volume overload.  He added  spironolactone 25 mg a day and cut her potassium back to 40 mEq b.i.d.  and asked her to follow up here today.  Jessica [name] continues to have  ongoing symptoms of class III New York Heart Association with dyspnea,  bloating, insomnia, occasional orthopnea, and cough.  She states  compliance with the medications.  However, in talking with her she  states she ate at [facility] yesterday.  She had a plain cheese burger,  [place] fries, and a large Coke.  Still continues to smoke average of  three cigarettes a day.  Really not much improvement in overall status.   PAST MEDICAL HISTORY:  1. Congestive heart failure secondary to ischemic cardiomyopathy with      EF of 25-30%.  2. History of coronary artery disease status post multiple      interventions and stents.  3. Ongoing tobacco use.  4. Dyslipidemia.  5. Type 2 diabetes.  6. History of medical noncompliance.  7. History of EtOH use.   REVIEW OF SYSTEMS:  As stated above; otherwise, negative.   CURRENT MEDICATIONS:  1. Coreg 3.125 mg in the morning and 6.25 at night.  2. Aspirin 81 daily.  3. Plavix 75 daily.  4. Crestor 20 daily.  5. Amaryl 2 mg daily.  6. Digoxin 0.125 mg daily.  7. Lasix 40 mg b.i.d.  8. Spironolactone 25 mg daily.  9. Atacand 4 mg daily.   PHYSICAL EXAMINATION:  VITAL SIGNS:  Weight 159 pounds, weight is up 4  pounds, blood pressure 110/82, heart rate 96.  NECK:  JVD to  earlobes.  LUNGS:  Clear to auscultation.  CARDIOVASCULAR:  S1, S2, positive S3.  ABDOMEN:  Bloated, distended, positive bowel sounds.  EXTREMITIES:  Lower extremities with a trace of edema.  NEUROLOGICAL:  Alert and oriented x3.   IMPRESSION:  Congestive heart failure New York Heart Association class  III with ongoing volume overload.  Will add Zaroxolyn 2.5 mg b.i.d. for  three days.  Continue spironolactone and potassium at current dose.  Check blood work on Thursday. Also have a nurse visit at that time with  [facility] for blood pressure and weight, and then I will follow up with  Jessica [name] in one week.  If no improvement in symptoms will need to admit  patient for intravenous diuresis.      Jessica [name], ACNP  Electronically Signed      Jessica [name]. Bensimhon, MD  Electronically Signed   MB/MedQ  DD: 07/22/2007  DT: 07/22/2007  Job #: [id number]   cc:  Jessica [name], M.D.

## 2010-07-25 NOTE — Discharge Summary (Signed)
[name], [name]             ACCOUNT NO.:  [id number]   MEDICAL RECORD NO.:  [id number]          PATIENT TYPE:  INP   LOCATION:  3709                         FACILITY:  [facility]   PHYSICIAN:  Bevelyn Buckles. Bensimhon, MDDATE OF BIRTH:  07/07/1960   DATE OF ADMISSION:  08/18/2007  DATE OF DISCHARGE:  08/20/2007                               DISCHARGE SUMMARY   PRIMARY CARDIOLOGIST:  Bevelyn Buckles. Bensimhon, MD   PRIMARY CARE PHYSICIAN:  No primary care physician at this time.   DISCHARGE DIAGNOSES:  1. Acute on chronic congestive heart failure secondary to ischemic      cardiomyopathy with EF 25-30% in the setting of ongoing EtOH use      and tobacco use.  2. Known coronary artery disease.  3. Ventricular ectopy pending EP evaluation outpatient.  4. Hypotension in the setting of multiple medications, stable at this      time.  5. Diabetes.  6. History of noncompliance.   HOSPITAL COURSE:  Jessica [name] is a 50 year old African American female  followed in the Heart Failure Clinic who presented on the day of  admission complaining of increased fatigue, generalized weakness, and  orthopnea with increased abdominal distention.  She states she had been  unable to take her medicines for the last few days because she felt so  lightheaded and washed out.  After taking them, her weight was up 6  pounds, blood pressure was 103/73.  She showed signs of volume overload  and was admitted for IV diuresis and possible inotropic therapy.  She  responded well to IV Lasix, tolerated p.o. medications.  Carvedilol had  to be held at one time secondary to hypotension with a systolic blood  pressure at 80.  Manual blood pressure was running in the low 90s.  The  patient up ambulating without complaints of lightheadedness or  dizziness, diuresed well, did have a noted 18 beat run of V tach  asymptomatic.  Urine drug screen was positive for EtOH at a level of 10.  Initial BNP was 1161, at the time of  discharge 943.  Weight at time of  discharge 69.9 kg.  The patient seen by Dr. Gala Romney and myself prior  to discharge.  Lewanda really requested him to go home, still the  patient is stable at this time.  To continue p.o. medications.  She was  scheduled to see me in the Heart Failure Clinic on August 27, 2007, at  2:30.   MEDICATIONS AT TIME OF DISCHARGE:  I have tried to stagger her medicines  to decrease the side effects and nausea.  1. Carvedilol, I will leave it 3.125 mg b.i.d.  2. Atacand, I will try 8 mg at bedtime.  3. Aspirin 81 mg daily.  4. Plavix 75 mg daily.  5. Potassium 40 mEq b.i.d.  6. Spironolactone 25 mg daily at noon.  7. Amaryl 2 mg daily.  8. Digoxin 0.25 mg daily at noon.  9. Crestor 40 mg at bedtime.  10.Furosemide 120 mg in the early a.m. and around 3:00 p.m.   Once again smoking cessation and complete EtOH abstinence  has been  reinforced with Kammi.  I will see her back in clinic next week and  arrange for EP evaluation.  At that time, we will plan on checking blood  work also.  At the time of discharge, magnesium is 1.7, potassium is  3.8, and BUN and creatinine 9 and 0.7.  We gave one dose of magnesium  oxide and extra dose of potassium prior to discharge.   DURATION OF DISCHARGE ENCOUNTER:  Greater than 30 minutes.      [name], ACNP      Bevelyn Buckles. Bensimhon, MD  Electronically Signed    MB/MEDQ  D:  08/20/2007  T:  08/21/2007  Job:  [id number]

## 2010-07-25 NOTE — Discharge Summary (Signed)
Jessica [name], Jessica [name]             ACCOUNT NO.:  [id number]   MEDICAL RECORD NO.:  [id number]          PATIENT TYPE:  INP   LOCATION:  2040                         FACILITY:  [facility]   PHYSICIAN:  Doylene Canning. Ladona Ridgel, MD    DATE OF BIRTH:  [birth year]   DATE OF ADMISSION:  02/17/2008  DATE OF DISCHARGE:  02/18/2008                               DISCHARGE SUMMARY   ADDENDUM:  One medication was left off her discharge summary of February 18, 2008, and that is aspirin 81 mg daily.  The patient takes in addition  to her Plavix 75 mg daily a baby aspirin 81 mg daily.      [name], PA      Doylene Canning. Ladona Ridgel, MD  Electronically Signed    GM/MEDQ  D:  02/18/2008  T:  02/19/2008  Job:  [id number]   cc:   Osvaldo Shipper. Spruill, M.D.

## 2010-07-25 NOTE — Assessment & Plan Note (Signed)
[facility]                          CHRONIC HEART FAILURE NOTE   DURA, MCCORMACK                      MRN:          [id number]  DATE:01/27/2008                            DOB:          1960/08/06    PRIMARY CARDIOLOGIST:  Bevelyn Buckles. Bensimhon, MD   PRIMARY CARE PHYSICIAN:  Osvaldo Shipper. Spruill, MD   Jessica [name] returns today for further followup of her congestive heart  failure which is secondary to ischemic cardiomyopathy.  I last saw her  here in the Heart Failure Clinic in early October.  Since that time, she  followed up with Dr. Ladona Ridgel for consideration of ICD therapy.  Livvy  saw Dr. Ladona Ridgel on December 17, 2007, thought her symptoms were class II to  III.  Treatment options discussed with the patient.  With the patient  considering her options, agreed to proceed with ICD insertion.  Note  that the patient would have to stop her Plavix for a couple of days  prior to the defibrillator implant.  Girtrude states she has been  considering whether or not to proceed with the ICD; however, in the  interim she has experienced significant volume overload and her weight  is up 9 pounds today.  Unfortunately, Shawnay has begun drinking again.  She is now having 24 ounce of beer 3 or 4 days a week and is also  continuing tobacco use.  She denies any recreational substances.  She is  also just taking her carvedilol 3.125 mg at bedtime because she states  it makes her feel so bad.  She never started the spironolactone 12.5 mg  b.i.d.  She is still taking that once a day and she never increased her  Lasix to 80 mg b.i.d.  She continues to take 80 in the morning and 40 in  the evening.  I recently had a repeat echocardiogram done that showed EF  still 20-25%.  No improvement whatsoever in her function.  She continues  to remain class III [place] Heart Association in regards to her  symptoms.   PAST MEDICAL HISTORY:  1. Congestive heart failure secondary to  severe ischemic      cardiomyopathy with EF of 20-25% status post recent echocardiogram      with no improvement.  2. Coronary artery disease status post multiple PCIs.  3. Ongoing tobacco and EtOH abuse.  4. History of medical noncompliance.  5. Dyslipidemia.  6. Type 2 diabetes.  7. Status post recent CPX tests, RER of 0.95 suggest a submaximal      aerobic effort, exercise testing with gas exchange demonstrated a      moderately-to-severely reduced functional capacity which is related      to her advanced heart failure.  Exercise oscillatory ventilation      and markedly elevated VE/VCO2 slope have been shown to be markers      of poor prognosis for patients with heart failure.   REVIEW OF SYSTEMS:  As stated above, otherwise negative.   CURRENT MEDICATIONS:  1. The patient is taking Atacand 8 mg.  2. She is currently out of digoxin  0.25 mg, she is only taking it 3      times a week.  3. The Coreg 3.125 mg is only being taken at bedtime.  4. The Lasix 80 mg b.i.d. is being taken 80 in the morning and 40 in      the evening.  5. Spironolactone 12.5 mg b.i.d. is being taken at 12.5 mg once a day.  6. Crestor 40 mg nightly.  7. Glimepiride 2 mg daily.  8. Plavix 75 mg daily.  9. Aspirin 81 mg daily.  10.KCl 20 mEq 2 tablets in the morning and one in the evening.  11.Oxygen 2 liters p.r.n.   PHYSICAL EXAMINATION:  VITAL SIGNS:  Weight 174 pounds, weight is up 9  pounds.  Blood pressure 111/73 with a heart rate of 83.  GENERAL:  Porshia is in no acute distress.  VITAL SIGNS:  JVD around 8-9 cm at a 45-degree angle.  LUNGS:  Clear to auscultation.  CARDIOVASCULAR:  S1 and S2.  Regular rate and rhythm.  I do not hear an  S3 at this time.  ABDOMEN:  Distended, soft, nontender.  Positive bowel sounds.  LOWER EXTREMITIES:  Without clubbing, cyanosis, or edema.   IMPRESSION:  Congestive heart failure secondary to ischemic  cardiomyopathy with signs of volume overload at this time.   I have had a  long quite frank discussion with Tiffinie concerning her diagnosis and  her mortality.  Also any further consideration of a transplant will not  be considered in the setting of ongoing ethyl alcohol and tobacco use.  We will go ahead and arrange for Starlit to follow up with Dr. Ladona Ridgel  for consideration of implantable cardioverter-defibrillator, possible  biventricular device.  I would ask that the patient have some type of  fluid monitoring device implanted for outpatient management of her fluid  volume status.   MEDICATIONS AS OF NOW:  1. Furosemide 80 mg b.i.d.  2. Carvedilol 3.125 mg b.i.d.  3. Atacand 8 mg daily.  4. Spironolactone 12.5 mg b.i.d.  5. Continue other medications as previously documented.     [name], ACNP  Electronically Signed      Bevelyn Buckles. Bensimhon, MD  Electronically Signed   MB/MedQ  DD: 01/27/2008  DT: 01/28/2008  Job #: [id number]   cc:   Osvaldo Shipper. Spruill, M.D.

## 2010-07-25 NOTE — Assessment & Plan Note (Signed)
[facility]                          CHRONIC HEART FAILURE NOTE   [name], [name]                      MRN:          [id number]  DATE:06/17/2007                            DOB:          Sep 28, 1960    PRIMARY CARE PHYSICIAN:  Osvaldo Shipper. Spruill, M.D.   PRIMARY CARDIOLOGIST:  Bevelyn Buckles. Bensimhon, MD.   [name] returns today for followup of her congestive heart failure which  is secondary to ischemic cardiomyopathy.  I have followed Jessica [name] very  closely over the last few weeks since she was discharged from the  hospital secondary to volume overload also associated with decreased  appetite, nausea, vomiting and abdominal bloating, cough, and increased  shortness of breath. I obtained a little blood work to make sure there  was not an underlying cause for her nausea and vomiting. Digoxin level  was actually low, so this was not contributing, so I suspect all her  symptoms are coming from her volume overload and her class III heart  failure.  When I last saw her, I increased her Lasix to 80 mg b.i.d.,  increased increase her K-Dur to 40 b.i.d., and start her back on her  digoxin and Crestor as her nausea, vomiting had subsided. And asked her  to return today for followup.  She continues to have a nonproductive  cough, abdominal bloating and shortness of breath with minimal exertion.  She states compliance with her medications.  Denies any presyncope or  syncopal episodes. Continues to have some mild lightheadedness and  dizziness if she stands too quickly and the nonproductive cough.  She  does have occasional orthopnea. In talking with her, she still continues  to smoke.  She states 1/2 pack will last for about a week, also  continues to have an occasional beer she says an on special occasions,  special occasions being ball games, etc.   PAST MEDICAL HISTORY:  1. Congestive heart failure secondary to ischemic cardiomyopathy with      EF 25-30%.  2. History of coronary artery disease status post multiple      interventions and stents.  3. Ongoing tobacco use.  4. Ongoing EtOH use.  5. Dyslipidemia.  6. Type 2 diabetes.  7. History of medical noncompliance.   REVIEW OF SYSTEMS:  As stated above, otherwise negative.   CURRENT MEDICATIONS:  1. Aspirin 81.  2. Plavix 75.  3. Crestor 20.  4. Amaryl.  5. Atacand 4.  6. Coreg 3.125 b.i.d.  7. Digoxin 0.125 daily.  8. Lasix 80 mg b.i.d.  9. K-Dur 40 mEq b.i.d.   PHYSICAL EXAMINATION:  VITAL SIGNS:  Weight 157. Weight is down 4 pounds  from the 25th.  Blood pressure 117/70, heart rate 97.   CLINICAL DATA:  Ambulated with Jessica [name].  She was able to walk  approximately 300 feet. During this time, her pulse oximetry went from  100-87%.  Her heart rate went from 100-110.  She returned to baseline  around 5-6 minutes upon discontinuation of exercise.  Jessica [name] was in no  acute distress.  JVD around 6-7 cm.  LUNGS:  She has  some fine rhonchi  scattered without crackles.  Cardiovascular exam reveals S1-S2, positive  S3. tachycardia.  Abdomen soft, nontender, distended, positive bowel  sounds. Lower extremities without clubbing, cyanosis or edema.  Neurologically alert and oriented x3.   IMPRESSION:  Congestive heart failure with signs of volume overload  still with class III symptoms. Increased Jessica [name]'s Lasix to 120 mg  b.i.d., increased her carvedilol to 2 tablets at bedtime and will leave  it at this dose.  I have once again re-educated Jessica [name] on importance of  complete alcohol abstinence and use of tobacco products. Also, if she  does not respond to this increased dose of diuretic, most likely will  need to be readmitted for more IV aggressive diuresis, close monitoring  of renal function.      [name], ACNP  Electronically Signed      Rollene Rotunda, MD, [facility]  Electronically Signed   MB/MedQ  DD: 06/18/2007  DT: 06/18/2007  Job #: [id number]

## 2010-07-25 NOTE — Assessment & Plan Note (Signed)
[facility]                          CHRONIC HEART FAILURE NOTE   Jessica, [name]                      MRN:          [id number]  DATE:06/04/2007                            DOB:          01/24/1961    PRIMARY CARE PHYSICIAN:  Osvaldo Shipper. Spruill, M.D.   PRIMARY CARDIOLOGIST:  Bevelyn Buckles. Bensimhon, MD   Jessica [name] returns today for followup of her congestive heart failure which  is secondary to ischemic cardiomyopathy.  I saw Jessica [name] last week at  which time I found her to be in volume overload.  She was also  experiencing some nausea and vomiting with decreased appetite.  I held  her day dig and Crestor and ordered blood work on her and had her return  today for followup. Also, spoke with her pharmacist. It turned out  Jessica [name]  had a prescription for the dig at 0.25 mg at home plus 1.25 mg.  She was taking one of each and this may been contributing to her nausea  and vomiting.  Her amylase came back normal. Her BMP was up to 1064,  potassium 3.5, BUN and creatinine 6 and 0.8. I also asked Jessica [name] to  increase her Lasix to 60 in the morning and 40 in the evening. She  returns today stating she is feeling much better.  She is not having any  more nausea or vomiting and it might be that she has had some type of GI  viral illness as her digoxin level actually came back low at 0.5 or it  may be that all her symptoms were coming from her volume overload.  She  states her breathing has improved and denies any orthopnea, but still  has abdominal bloating and dyspnea with minimal exertion.   PAST MEDICAL HISTORY.:  1. Congestive heart failure secondary to ischemic cardiomyopathy with      ejection fraction 25-30%  2. History of coronary artery disease status post multiple      interventions and stents.  3. Ongoing tobacco use.  4. Dyslipidemia.  5. Type 2 diabetes  6. History of medical noncompliance.   REVIEW OF SYSTEMS:  As stated above, otherwise  negative.   CURRENT MEDICATIONS:  1. Aspirin 81 mg daily.  2. Plavix 75.  3. K-Dur 20 b.i.d.  4. Digoxin is currently on hold.  5. Crestor is on hold.  6. Amaryl 2 mg daily.  7. Atacand 4 mg daily.  8. Lasix 60 in the morning and  40 in the evening.  9. Coreg 3.125 (previously I had increased her evening dose of Coreg      to 6.25 mg.  However, in the setting of her nausea and vomiting      last visit I had left her back to 3.125 mg b.i.d. until further      followup).   PHYSICAL EXAM:  Weight 161, blood pressure 101/74 with heart rate of 86.  Jessica [name]  is in no acute distress today.  NECK:  JVD around 6 cm.  LUNGS:  Scattered rhonchi without crackles.  CARDIOVASCULAR: Reveals S1-S2, positive S3.  ABDOMEN:  Soft, nontender,  distended, positive bowel sounds.  Lower extremities with +1 pitting edema.  NEUROLOGICAL:  Alert and oriented x3.   IMPRESSION:  Congestive heart failure with signs of volume overload  still in patient with class III symptoms. Increase her Lasix to 80 mg  b.i.d., increase K-Dur to 40 mEq b.i.d., resume Crestor 20 mg nightly  and resume digoxin 0.125 mg daily and I will see the patient back in 1  week for followup.      [name], ACNP  Electronically Signed      Rollene Rotunda, MD, [facility]  Electronically Signed   MB/MedQ  DD: 06/04/2007  DT: 06/04/2007  Job #: [id number]

## 2010-07-25 NOTE — Assessment & Plan Note (Signed)
[facility]                          CHRONIC HEART FAILURE NOTE   [name], [name]                      MRN:          [id number]  DATE:05/27/2007                            DOB:          10/04/1960    PRIMARY CARE PHYSICIAN:  Dr. [name].   PRIMARY CARDIOLOGIST:  Dr. [name].   Jessica [name] returns today for further followup of her congestive heart  failure which is secondary to ischemic cardiomyopathy.  I just saw the  patient as a new patient in the clinic couple weeks ago at which time  she had some mild signs of volume overload.  Also we had stopped some of  her medications.  I made changes in her medications, increasing her  Coreg, restarting her on an ARB and her Plavix and asked her to follow  up today for reevaluation.  The patient returns today stating that she  feels more short of breath since I last saw her.  She has a  nonproductive cough.  She states she has vomited a few times after  eating.  She gets nauseated and has episodes of vomiting.  Also  complaining of orthopnea again.  She states her weight at home has been  around 165 pounds, which would make her up almost 10 pounds since she  was discharged home.  She states compliance with her medications.  Continues to smoke cigarettes daily.  Denies any EtOH use.  She does  state compliance with her Lasix.  Is not checking her CBG.  Is supposed  to follow up with her primary care physician about further management of  this.  Overall is not feeling too well today.  Denies any fever or  chills, nonproductive cough.   PAST MEDICAL HISTORY:  1. Congestive heart failure secondary to ischemic cardiomyopathy with      EF of 25-30% confirmed by echocardiogram this month.  2. Status post right heart catheterization, right heart pressure      demonstrated left side heart failure with resultant secondary      pulmonary hypertension.  3. History of coronary artery disease status post  multiple      interventions and stents.  4. Ongoing tobacco use.  5. Dyslipidemia.  6. Type 2 diabetes with noncompliance to CBG.  7. Medication noncompliance.  8. History of EtOH abuse, quit approximately 1 year ago.  9. Dyslipidemia.   REVIEW OF SYSTEMS:  As stated above, otherwise negative.   CURRENT MEDICATIONS:  Include aspirin 81 mg daily, Plavix 75, Lasix 40  b.i.d., K-Dur 20 mEq b.i.d., digoxin 0.25 daily, Coreg 3.125 in the  morning and 6.25 in the evening, Crestor 20 mg daily, Amaryl 2 mg daily,  Atacand 40 mg daily.   PHYSICAL EXAM:  Weight 161 pounds.  The patient's weight is up 3 pounds,  blood pressure 97/66 with a with a pulse of 90.  Ms. Grein is in no acute distress.  She has a nonproductive cough.  JVD around 8 cm.  LUNGS:  She has some scattered rhonchi do not hear any crackles at this  time.  Cardiovascular exam reveals S1-S2,  positive S3.  ABDOMEN:  Soft, nontender, mildly distended, positive bowel sounds.  LOWER EXTREMITIES:  Without clubbing, cyanosis.  She has a trace of  edema.  NEUROLOGICAL:  Alert and oriented x3.   IMPRESSION:  Congestive heart failure with signs of volume overload  class III symptoms, not sure exactly what is causing the patient to be  nauseated and vomit.  Could be due to volume overload.  I am going to  check a BMP, BMET, and a lipase on her today.  Increase her Lasix to 60  in the morning and 40 in the evening.  Hold her Crestor and digoxin,  also check a dig level, and use Phenergan 6.25 mg p.r.n. for nausea and  see her back in 1 week.  Will not adjust her carvedilol at this time  secondary to symptoms.      [name], ACNP  Electronically Signed      Rollene Rotunda, MD, [facility]  Electronically Signed   MB/MedQ  DD: 05/27/2007  DT: 05/27/2007  Job #: [id number]

## 2010-07-25 NOTE — Assessment & Plan Note (Signed)
[facility]                            CARDIOLOGY OFFICE NOTE   Jessica [name], Jessica [name]                      MRN:          [id number]  DATE:02/24/2008                            DOB:          1960/09/17    INTERVAL HISTORY:  Jessica [name] is a 50 year old woman with a history of  congestive heart failure secondary to severe ischemic cardiomyopathy  with EF of 20-25%.  She also has a history of coronary artery disease  status post multiple PCIs, diabetes, hyperlipidemia, ongoing tobacco and  alcohol use.  She had a CPX test back in August 2009 which showed a peak  VO2 of 11.7 with a slope of 43 and oscillatory ventilation.  The RAR was  only 0.95.   She returns today for routine followup.  She underwent placement of a  single-chamber prophylactic ICD last week by Dr. Ladona Ridgel.  She says  unfortunately she continues to feel tired all day.  She gets up and just  wants to go back to bed.  She is short of breath with any activity.  Blood pressure is usually in the 80s, and when she takes her Coreg this  drops sometimes in the 70s and she feels weak.  She has not had any  chest pain.  She does feel like she swelling more though she does not  have any orthopnea or PND.  She denies any fevers or chills.   Current medications are:  1. Atacand 8 mg at night.  2. Aspirin 81 a day.  3. Plavix 75 a day.  4. Glimepiride 2 mg a day.  5. Digoxin 0.25 a day.  6. Crestor 40 a day.  7. Spironolactone 12.5 a day.  8. Potassium 40 in the morning and 20 in the afternoon.  9. Lasix 80 b.i.d.  10.Coreg which she cut down now 3.125 a day.   PHYSICAL EXAMINATION:  GENERAL:  She is in no acute distress.  Ambulates  around the clinic without any respiratory difficulty.  VITAL SIGNS:  Blood pressure here was 105/66.  Heart rate was 79.  Weight is 176 which is up 7 pounds.  HEENT:  Normal.  NECK:  Supple.  There is no obvious JVD.  Carotids are 2+ bilaterally  without any bruits.   There is no lymphadenopathy or thyromegaly.  CARDIAC:  PMI is laterally displaced.  She has distant heart sounds.  She is regular with a 2/6 systolic ejection murmur at the apex.  There  was a question of a soft S3.  There is some very mild skin erosion over  her defibrillator scar due to her bra strap.  LUNGS:  Clear.  ABDOMEN:  Distended and nontender.  No obvious hepatosplenomegaly.  No  bruits.  No masses.  Good bowel sounds.  EXTREMITIES:  Warm with no cyanosis, clubbing, or edema.  No rash.  NEUROLOGIC:  Alert and oriented x3.  Cranial nerves II through XII are  intact.  Moves all 4 extremities without difficulty.   EKG shows sinus rhythm at a rate of 79 with nonspecific ST-T wave  abnormalities inferiorly.   ASSESSMENT:  1.  Congestive heart failure secondary to ischemic cardiomyopathy.  I      am really worried that Jessica [name] is doing worse.  She has low output      symptoms.  Her weight is a bit up, but I do not see overwhelming      amount of volume on board.  Her symptomatology is really [place]      Heart Association class IIIB.  Unfortunately, she is not a      candidate for advanced therapies at this point as she continues to      smoke a few cigarettes a day and has an occasional beer.  I have      stressed to her the need to be totally absent from this so we can      get her on the path for consideration for transplant or other      advanced therapies.  For now, we will have her take an extra dose      of Lasix for the next 3 days and will try to increase her Atacand      to 8 mg b.i.d. and see if she can tolerate this to help with      afterload reduction.  2. Tobacco and alcohol use as above.  I counseled her to stop.  She      has definitely cut back, but she needs to stop totally.  3. Coronary artery disease.  This is stable.  No evidence of      significant ischemia at this time.   DISPOSITION:  We will see her back in the Heart Failure Clinic in a  couple of  weeks, and I will see her back in 1-2 months in my clinic.  I  told her to call if she is not feeling better.     Jessica [name]. Bensimhon, MD  Electronically Signed    DRB/MedQ  DD: 02/24/2008  DT: 02/25/2008  Job #: [id number]

## 2010-07-25 NOTE — Assessment & Plan Note (Signed)
[facility]                         ELECTROPHYSIOLOGY OFFICE NOTE   TONI, DEMO                      MRN:          [id number]  DATE:02/04/2008                            DOB:          23-Sep-1960    [name] returns today for followup.  She is a very pleasant woman  with a history of an ischemic cardiomyopathy, congestive heart failure,  severe LV dysfunction, EF of 15%.  She returns today for followup.  I  had seen her back several weeks ago and talked about prophylactic ICD  insertion.  There is a question about biventricular defibrillator, but  the patient's QRS is narrow at 90 milliseconds making her not a  candidate for BiV device.  She returns today for her followup.   MEDICATIONS:  1. Lasix 80 mg in the morning and 40 in the evening.  2. She is also on Coreg 3.125 twice daily as tolerated.  3. Aldactone 12.5 daily.  4. Crestor 40 a day.  5. Glyburide 2 a day.  6. Plavix 75 a day.  7. Aspirin 81 a day.  8. Atacand 8 mg daily.   PHYSICAL EXAMINATION:  GENERAL:  She is a pleasant, middle-aged woman in  no distress.  VITAL SIGNS:  Blood pressure today was 89/66, the pulse 73 and regular,  and respirations were 18.  The weight was 170 pounds.  NECK:  No jugular vein distention.  LUNGS:  Clear bilaterally to auscultation.  No wheezes, rales, or  rhonchi are present.   IMPRESSION:  1. Ischemic and nonischemic cardiomyopathy.  2. Class II-III congestive heart failure with a narrow QRS.  3. Dyslipidemia.  4. Borderline diabetes.   DISCUSSION:  I have discussed the treatment options with [name]  including the risks, benefits, goals, and expectations of prophylactic  defibrillator insertion.  She would like to proceed with this.  It will  be scheduled at earliest possible convenient time.     Doylene Canning. Ladona Ridgel, MD  Electronically Signed    GWT/MedQ  DD: 02/04/2008  DT: 02/05/2008  Job #: [id number]

## 2010-07-25 NOTE — Op Note (Signed)
Jessica [name], Jessica [name]             ACCOUNT NO.:  [id number]   MEDICAL RECORD NO.:  [id number]          PATIENT TYPE:  INP   LOCATION:  2040                         FACILITY:  [facility]   PHYSICIAN:  Doylene Canning. Ladona Ridgel, MD    DATE OF BIRTH:  10/28/1960   DATE OF PROCEDURE:  02/17/2008  DATE OF DISCHARGE:                               OPERATIVE REPORT   PROCEDURE PERFORMED:  Implantation of a single-chamber defibrillator.   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  The patient is a 50 year old woman with  longstanding cardiomyopathy.  She has class II heart failure.  She is on  maximal medical therapy.  She has a narrow QRS and is now referred for  ICD implantation.   PROCEDURE:  After informed consent was obtained, the patient was taken  to the Diagnostic EP Lab in a fasting state.  After usual preparation  and draping, intravenous fentanyl and midazolam was given for sedation.  A 30 mL lidocaine was infiltrated in the left infraclavicular region.  A  7-cm incision was carried out over this region.  Electrocautery was  utilized to dissect down the fascial plane.  The left subclavian vein  was then punctured and the Biotronik Linox SD ICD lead, serial number  [id number], was advanced into the right ventricle.  Mapping was carried  out at the final site.  The R-waves were 60 mV.  The pacing impedance  was 700 ohms.  The threshold 0.6 volts at 0.5 milliseconds.  A 10-volt  pacing did not stimulate the diaphragm.  With lead actively fixed, there  was a large injury current present.  With these satisfactory parameters,  the lead was secured to subpectoralis fascia with figure-of-eight silk  suture.  The sewing sleeve was also secured with silk suture.  Electrocautery was utilized to make subcutaneous pocket.  Kanamycin  irrigation was used to irrigate the pocket and electrocautery was  utilized to assure hemostasis.  The Biotronik Lumax 540 VR-T  single-  chamber defibrillator, serial number [id number], was connected to the  defibrillation lead and placed back in the subcutaneous pocket.  The  pocket was irrigated with kanamycin and the patient was more deeply  sedated for defibrillation threshold testing.   After the patient was more deeply sedated with fentanyl and Versed, VF  was induced with a T-wave shock.  A 20-joule shock was delivered, which  terminated VF and restoring sinus rhythm.  At this point, no additional  defibrillation threshold testing was carried out and the incision was  closed with 2-0 Vicryl followed by layer of 3-0 Vicryl.  Benzoin was  painted on the skin.  Steri-Strips were applied.  The pressure dressing  was placed, and the patient was returned to her room in satisfactory  condition.   COMPLICATIONS:  There were no immediate procedure complications.   RESULTS:  Demonstrate successful implantation of a Biotronik single-  chamber defibrillator in a patient with nonischemic cardiomyopathy,  congestive heart failure, and a narrow QRS.     Doylene Canning. Ladona Ridgel, MD  Electronically Signed    GWT/MEDQ  D:  02/17/2008  T:  02/18/2008  Job:  [id number]   cc:   Bevelyn Buckles. Bensimhon, MD

## 2010-07-25 NOTE — Cardiovascular Report (Signed)
Jessica [name], Jessica [name]             ACCOUNT NO.:  [id number]   MEDICAL RECORD NO.:  [id number]          PATIENT TYPE:  INP   LOCATION:  2927                         FACILITY:  [facility]   PHYSICIAN:  Rollene Rotunda, MD, FACCDATE OF BIRTH:  28-Dec-1960   DATE OF PROCEDURE:  05/01/2007  DATE OF DISCHARGE:                            CARDIAC CATHETERIZATION   PROCEDURE:  Right heart catheterization.   INDICATIONS:  Evaluate patient with congestive heart failure.   PROCEDURE NOTE:  Right heart catheterization performed via right femoral  vein.  The vessel was cannulated using anterior wall puncture.  A 7-  French venous sheath was inserted via the Seldinger technique.  A Swan-  Ganz catheter was utilized.  The patient tolerated the procedure well  and left the lab in stable condition.   HEMODYNAMIC RESULTS:  RA mean 18, RV 65/60 with a mean of 24, PA 56/30  with a mean of 40, pulmonary capillary wedge pressure mean 36.  Cardiac  output/cardiac index (FICK) 3.89/2.06.  Pulmonary vascular resistance  288 dynes.   ASSESSMENT/PLAN:  The right heart pressures demonstrate left-sided heart  failure with resultant secondary pulmonary hypertension.  Given this, we  will start milrinone and continuous IV Lasix.      Rollene Rotunda, MD, [facility]  Electronically Signed     JH/MEDQ  D:  05/01/2007  T:  05/02/2007  Job:  [id number]   cc:   Osvaldo Shipper. Spruill, M.D.

## 2010-07-25 NOTE — Assessment & Plan Note (Signed)
[facility]                            CARDIOLOGY OFFICE NOTE   Jessica [name], Jessica [name]                      MRN:          [id number]  DATE:11/28/2007                            DOB:          1960-12-23    HEART FAILURE NOTE   PRIMARY CARE/PRIMARY CARDIOLOGIST:  Jessica Shipper. Spruill, MD   INTERVAL HISTORY:  Jessica [name] is a 50 year old woman with a history of  congestive heart failure secondary to severe ischemic cardiomyopathy.  She has an ejection fraction of about 25%.  She has had multiple  coronary stents.  Remainder of her history is also notable for diabetes,  hyperlipidemia, noncompliance, and a history of ongoing tobacco use and  previous alcohol use.   She has been followed quite closely in the Heart Failure Clinic by  [name]; however, this has been complicated by noncompliance.  She was recently on Coreg 3.125 b.i.d., but she had to stop her morning  dose as it really wipes her out, and she feels very lethargic.  She  otherwise tells me she is taking her meds as prescribed.  Unfortunately,  she feels like her weight is up a bit.  She has been bloated.  She  attributes this to drinking lots of water at night about 8 glasses some  nights because she feels thirsty.  She does have some orthopnea and  occasional PND.  Her functional status has been very limited.  She  recently underwent a CPX test and this was a submaximal effort with a  peak RVR of only 0.95, peak VO2 was 11.7, which is 48% of predicted;  however, her slope was markedly elevated at 43, and there was evidence  of oscillatory ventilation, and in combination with a high slope, this  was felt to be reflective of very poor cardiac status.  She has not had  any chest pain.   CURRENT MEDICATIONS:  1. Atacand 8 mg at night.  2. Aspirin 81 a day.  3. Plavix 75 a day.  4. Potassium 40 in the morning and 20 at night.  5. Glimepiride 2 a day.  6. Digoxin 0.25 a day.  7. Crestor 40  at night.  8. Lasix 120 mg in the morning.  9. Coreg 3.125 mg only at night.  10.Spironolactone 12.5 b.i.d.   PHYSICAL EXAMINATION:  GENERAL:  She is in no acute distress.  She walks  around the clinic slowly without any respiratory distress.  VITAL SIGNS:  Blood pressure is 99/74, heart rate is 85, and weight is  16, which is up 10 pounds from before.  HEENT:  Normal.  NECK:  Supple.  JVP is about 6 cm of water.  Carotids are 2+ bilaterally  without bruits.  CARDIAC:  PMI is laterally displaced.  She is regular.  I do not hear an  S3.  There is a soft systolic ejection murmur at the apex.  LUNGS:  Clear.  ABDOMEN:  Distended, but nontender.  There is no appreciable  hepatosplenomegaly.  No bruits and no masses.  EXTREMITIES:  Warm with no cyanosis, clubbing, or edema.  No rash.  NEUROLOGIC:  Alert and oriented x3.  Cranial nerves II through XII are  intact.  Moves all fours extremities without difficulty.   EKG shows sinus rhythm at a rate of 85 with QT prolongation.   ASSESSMENT AND PLAN:  1. Congestive heart failure.  She continues to be [place] Heart      Association Class III-III B.  Cardiopulmonary exercise test is      concerning for significant cardiac limitation.  Unfortunately, her      course has been complicated by noncompliance as well as intolerance      of medications due to hypotension.  At this point, I have asked her      to try getting her Coreg back to 3.125 b.i.d.  We will suppress her      Lasix down to 80 in the morning and 40 at night.  For the next day      or two, I have asked her to take 80 b.i.d.  Hopefully, we can      continue to titrate her medications slowly.  I do not think now she      is at the point where she needs inotropes.  We have also discussed      an ICD and we will refer her for prophylactic defibrillator.  2. Tobacco use.  I once again reminded her of the need to stop      smoking.  3. Coronary artery disease, it is stable.  No  evidence of obvious      ischemia.   DISPOSITION:  We will get labs today and will see her back in Heart  Failure Clinic in 3 weeks for further medication titration.     Jessica [name]. Bensimhon, MD  Electronically Signed    DRB/MedQ  DD: 11/28/2007  DT: 11/29/2007  Job #: [id number]

## 2010-07-25 NOTE — Assessment & Plan Note (Signed)
[facility]                          CHRONIC HEART FAILURE NOTE   Jessica [name], Jessica [name] [name]                      MRN:          [id number]  DATE:10/14/2007                            DOB:          July 05, 1960    PRIMARY CARE:  Osvaldo Shipper. Spruill, MD   PRIMARY CARDIOLOGIST:  Bevelyn Buckles. Bensimhon, MD   Jimmy returns today for followup of her congestive heart failure which  is secondary to ischemic cardiomyopathy in the setting of ongoing  tobacco and EtOH abuse and medical noncompliance.  I last saw Jessica [name] in  June.  She missed her appointment in July and rescheduled for today.  When I saw her in June, I had scheduled her for CPX test.  She states  when she tried to schedule it, she was told that her insurance had  lapsed and she could not do the CPX test.  I was not aware of this,  therefore Jessica [name] has not completed the CPX test as of yet.  She states  she has been doing well since I last saw her, but on further discussion,  she complains of dizziness and lightheadedness after taking her Coreg  and has completely stopped her carvedilol.  She also states she has run  out of her spironolactone and has not been taking that either.  She  complains of ongoing fatigue, decreased energy, occasional cough, and  clear sputum.  Denies any fever, chills, complains of being hypotensive  at home.  She states she could obtain a blood pressure cuff and gets  readings of 90s over 60 to 70s.  She states compliance with her other  medications.  Most recent lab work checked on October 01, 2007, potassium  4.2, BUN and creatinine 12 and 0.8 with a BNP of 363.   REVIEW OF SYSTEMS:  As stated above, otherwise negative.   CURRENT MEDICATIONS:  1. Atacand 8 mg.  2. Aspirin 81 mg.  3. Plavix 75.  4. K-Dur 40 mEq b.i.d.  5. Glimepiride 2 mg daily.  6. Digoxin 0.25 mg daily.  7. Crestor 40 mg daily.  8. Furosemide 120 mg b.i.d.  9. Jessica [name] stopped the carvedilol and the  spironolactone.   PHYSICAL EXAMINATION:  VITAL SIGNS:  Weight 152 pounds, weight is  consistent; blood pressure 86/63 with a heart rate of 79.  GENERAL:  Jessica [name] [name] is in no acute distress.  No signs of jugular vein  distention at 45-degree angle.  LUNGS:  Clear to auscultation bilaterally.  CARDIOVASCULAR EXAM:  Reveals S1 and S2, positive S4.  ABDOMEN:  Soft, nontender, and positive bowel sounds.  Mildly distended  lower extremities without clubbing, cyanosis, or edema.  NEUROLOGICALLY:  Alert and oriented x3.   IMPRESSION:  Congestive heart failure secondary to ischemic  cardiomyopathy with EF currently 25-30% by echocardiogram.  In the  setting of ongoing medical noncompliance, I am going to reschedule  Jessica [name] for the CPX test.  She states she has had her insurance  reinstalled.  I have also stressed the importance of her carvedilol  therapy.  She is reluctant to try this again.  I have  compromised with  her, we are going to try the carvedilol 3.125 mg just at bedtime and see  how she does and then I will have her followup with Dr. Gala Romney for  further evaluation.  I am going to change her spironolactone to 12.5 mg  b.i.d.  Set her a new prescription for the carvedilol and  spironolactone.      [name], ACNP  Electronically Signed      Rollene Rotunda, MD, [facility]  Electronically Signed   MB/MedQ  DD: 10/14/2007  DT: 10/15/2007  Job #: [id number]   cc:   Osvaldo Shipper. Spruill, M.D.

## 2010-07-25 NOTE — Discharge Summary (Signed)
NAMEMARAGRET, VANACKER             ACCOUNT NO.:  [id number]   MEDICAL RECORD NO.:  [id number]          PATIENT TYPE:  INP   LOCATION:  2003                         FACILITY:  [facility]   PHYSICIAN:  Bevelyn Buckles. Bensimhon, MDDATE OF BIRTH:  1960-04-10   DATE OF ADMISSION:  04/25/2007  DATE OF DISCHARGE:  05/06/2007                               DISCHARGE SUMMARY   PRIMARY CARDIOLOGIST:  Arvilla Meres, MD.   PRIMARY CARE Steve Gregg:  [name], MD.   DISCHARGE DIAGNOSIS:  Acute on chronic systolic congestive heart  failure.   SECONDARY DIAGNOSES:  1. Coronary artery disease status post multiple coronary      interventions.  2. Ischemic cardiomyopathy.  3. Hypertension.  4. Hyperlipidemia.  5. Type 2 diabetes mellitus.  6. Ongoing tobacco use.  7. History of ethanol abuse.   ALLERGIES:  1. ALTACE.  2. LATEX.   PROCEDURES:  Right heart cardiac catheterization demonstrating left-  sided heart failure with markedly eleveated wedge pressure.   HISTORY OF PRESENT ILLNESS:  A 50 year old African-American female with  a prior history of CAD, ischemic cardiomyopathy and chronic systolic  heart failure who presented to [facility] April 25, 2007, secondary  to progressive dyspnea and increasing abdominal girth as well as lower  extremity edema and orthopnea.  Chest x-ray showed acute pulmonary edema  with small effusions and she was admitted by Dr. Shana Chute for further  management of acute on chronic systolic heart failure.   HOSPITAL COURSE:  The patient was initiated on intravenous Lasix and was  noted to be hypotensive, requiring vasopressor support with dopamine.  She was diuresed approximately 5 kg, however, remained weak and volume  overloaded.  Dr. Gala Romney was consulted on February 18th for heart  failure management.  A decision was made at that time to place a PICC  line, initiate milrinone infusion and also switch her to a Lasix drip.  With this combination of therapy  Ms. Rubendall had improved diuresis and  clinical improvement.  A right heart cardiac catheterization was  performed February 19th revealing a wedge of 36 with a PA pressure 56/30  with a mean of 40.  Cardiac output was 3.89 with an index of 2.06.  Continued IV diuresis and milrinone was recommended.  She continued to  diurese well and we were able to discontinue her milrinone and switch  her from Lasix drip to bolus Lasix and then subsequently oral Lasix.  She has had a reduction in weight from 86 kilos on admission to 70.2  kilos on discharge.  We have worked to reinitiate beta-blocker therapy  as well as angiotensin receptor blocker and have also placed her on  digoxin therapy, all of which she has tolerated up to this point.  Because of her severe heart failure, we have discontinued her metformin  and Actos and instead have placed her on Amaryl therapy and have  recommended she follow up with Dr. Shana Chute for further management of her  diabetes.  We will plan to follow her up in heart failure clinic on  March 5th, at which time we will repeat a BMET.  Will  plan to repeat  echocardiogram in approximately 12 weeks maximum therapy and if EF  remains low at that time we will plan on ICD placement.  Ms. Muilenburg has  significantly clinically improved and is being discharged home today in  good condition.   DISCHARGE LABS:  Hemoglobin 11.6, hematocrit 35.2, WBC 5.8, platelets  263,000.  Sodium 138, potassium 3.9, chloride 100, CO2 29.  BUN 12,  creatinine 0.90, glucose 112, calcium 9.4, total bilirubin 1.0, alkaline  phosphatase 114, AST 18, ALT 14, total protein 6.6, albumin 3.0.  Admission BNP was 1056.0.  CK 51, MB 1.2, troponin I 0.03.  Urinalysis  was negative.   DISPOSITION:  The patient is being discharged home today in good  condition.   FOLLOWUP PLANS AND APPOINTMENT:  1. She is to follow up with [name], Nurse Practitioner, in      the [facility] Heart Failure  Clinic on March 5th at 2:30      p.m.  2. She is asked to follow up with Dr. Shana Chute as previously scheduled.   DISCHARGE MEDICATIONS:  1. Aspirin 81 mg daily.  2. Plavix 75 daily.  3. Lasix 40 mg b.i.d.  4. K-Dur 20 mEq b.i.d.  5. Digoxin 0.25 mg daily.  6. Coreg 3.125 mg b.i.d.  7. Diovan 40 mg daily.  8. Nitroglycerin 0.4 mg sublingual p.r.n. chest pain.  9. Crestor 20 mg daily.  10.Amaryl 2 mg daily.   OUTSTANDING LAB STUDIES:  None.   DURATION OF DISCHARGE ENCOUNTER:  68 minutes, including physician time.      Nicolasa Ducking, ANP      Bevelyn Buckles. Bensimhon, MD  Electronically Signed    CB/MEDQ  D:  05/06/2007  T:  05/06/2007  Job:  [id number]   cc:   Osvaldo Shipper. Spruill, M.D.

## 2010-07-25 NOTE — Assessment & Plan Note (Signed)
[facility]                          CHRONIC HEART FAILURE NOTE   RUT, BETTERTON                      MRN:          [id number]  DATE:08/27/2007                            DOB:          1960/04/30    PRIMARY CARE PHYSICIAN:  Osvaldo Shipper. Spruill, MD   PRIMARY CARDIOLOGIST:  Bevelyn Buckles. Bensimhon, MD   HISTORY OF PRESENT ILLNESS:  Jessica Jessica [name] returns today for followup of Jessica [name]  congestive heart failure which is secondary to ischemic cardiomyopathy,  status post recent hospitalization for acute exacerbation.  Jessica Jessica [name]  responded well to IV diuretic therapy.  Jessica discharged Jessica [name] home last week  to return today for post-hospital visit.  Jessica Jessica [name]  medications because she was staying so nauseated from Jessica [name] medications  and experiencing intermittent lightheadedness and dizziness after taking  Jessica [name] medicines, so we worked as scheduled to stagger Jessica [name] medicines.  She  states this is working quite well for Jessica [name], no longer has been nauseated  or experiencing lightheadedness or dizziness, which was the main reason  she was not taking Jessica [name] medications appropriately.  She states she has  been sleeping really well, has kept the fluid off since being discharged  home, has not had any alcohol since being discharged home; however, has  continued to smoke 1-3 cigarettes a day as that much.   PAST MEDICAL HISTORY:  1. Congestive heart failure secondary to ischemic cardiomyopathy with      EF 25-30%.  2. Coronary artery disease, status post multiple PCIs.  3. Ongoing tobacco and EtOH use.  4. History of medical noncompliance.  5. Dyslipidemia.  6. Type 2 diabetes.   REVIEW OF SYSTEMS:  As stated above, otherwise negative.   ALLERGIES:  ACE INHIBITORS and LATEX.   CURRENT MEDICATIONS:  1. Carvedilol 3.125 mg b.Jessica.d.  2. Atacand 8 mg at bedtime.  3. Aspirin 81 mg daily.  4. Plavix 75 mg daily.  5. K-Dur 40 mEq b.Jessica.d.  6. Spironolactone 25 mg daily at noon.  7. Glimepiride 2 mg daily.  8. Digoxin 0.25 mg daily.  9. Crestor 40 mg at bedtime.  10.Furosemide 120 mg b.Jessica.d.   REVIEW OF SYSTEMS:  As stated above, otherwise negative.   PHYSICAL EXAMINATION:  VITAL SIGNS:  Weight 151, weight is down 8  pounds, blood pressure 92/63 with a heart rate of 90.  GENERAL:  Jessica [name] is in no acute distress.  NECK:  No signs of jugular vein distention at 45-degree angle.  LUNGS:  Clear to auscultation bilaterally.  CARDIOVASCULAR:  S1 and S2 with a positive S4.  ABDOMEN:  Soft and nontender.  Positive bowel sounds.  EXTREMITIES:  Lower extremities are without clubbing, cyanosis, or  edema.  NEUROLOGIC:  Alert and oriented x3.   IMPRESSION:  Congestive heart failure secondary to ischemic  cardiomyopathy.  Fluid volume status stable at this time.  We will  continue current medications.  Jessica [name] needs to proceed with a CPX test.  Jessica have scheduled this before and she has cancelled.  We will arrange for  Jessica [name] to have this.  Follow up with Jessica [name] in  3-4 weeks following Jessica [name] CPX  test.      [name], ACNP  Electronically Signed      Bevelyn Buckles. Bensimhon, MD  Electronically Signed   MB/MedQ  DD: 08/27/2007  DT: 08/28/2007  Job #: [id number]

## 2010-07-25 NOTE — Assessment & Plan Note (Signed)
[facility]                          CHRONIC HEART FAILURE NOTE   Jessica [name], Jessica [name]                      MRN:          [id number]  DATE:07/08/2007                            DOB:          Mar 10, 1961    PRIMARY CARDIOLOGIST:  Arvilla Meres.   PRIMARY CARE PHYSICIAN:  Osvaldo Shipper. Spruill, M.D.   Jessica [name] returns today for followup of her congestive heart failure which  is secondary to ischemic cardiomyopathy.  I last saw Jessica [name] a couple  weeks ago, at which time I found her to be in volume overload still with  class III symptoms.  I increased Mkayla's Lasix to 120 mg b.i.d. and  increased her carvedilol to 6.25 mg at bedtime with instructions to  continue the 3.125 mg in the morning.  Once again reiterated the  importance of complete alcohol and tobacco abstinence.  Jessica [name] states  she is down to 3 cigarettes every other day.  However, she still having  ongoing symptoms of volume overload.  In further discussion with her,  however, she did increase her Lasix to 120 mg b.i.d. for a couple of  days but because of frequent urination and some stress incontinence, she  states she cut the dose back to 80 mg b.i.d. and has been taking it that  way since then.  She is complaining of increased dyspnea on exertion,  orthopnea.  She is sleeping on 2-3 pillows.  She is using her oxygen at  bedtime again and she has a nonproductive cough with abdominal  distention and bloating, lower extremity edema.   PAST MEDICAL HISTORY:  1. Congestive heart failure secondary to ischemic cardiomyopathy with      an EF of 25-30%.  2. History of coronary artery disease, status post multiple      interventions and stents.  3. Ongoing polysubstance abuse including tobacco and EtOH.  4. Dyslipidemia.  5. Type 2 diabetes.  6. Medical noncompliance.   REVIEW OF SYSTEMS:  As stated above, otherwise negative physical exam.  Weight 161.  Weight is up 4 pounds.  Blood pressure  115/84 with a heart  rate of 94.  [name] is mildly tachypneic with minimal exertion here in the clinic.  She has JVD around 8 cm at a 45-degree angle.  LUNGS:  Clear to auscultation.  CARDIOVASCULAR EXAM:  Reveals an S1 and S2, positive S3.  Mildly  tachycardic with minimal exertion.  ABDOMEN:  Soft, distended, positive bowel sounds.  LOWER EXTREMITIES:  With +1 pitting edema.  NEUROLOGICAL:  Alert and oriented x3.   IMPRESSION:  Congestive heart failure secondary to ischemic  cardiomyopathy with signs of volume overload and class III symptoms at  this time with patient noncompliant with diuretic dosing.  I have been  rather frank with Jessica [name].  She has 1 week to get herself together.  I  am going to increase her Lasix back to 120 mg b.i.d.  If no improvement  in 1 week, she will have to be readmitted for IV diuresis.  I have also  increased her carvedilol to 6.25 mg b.i.d.  I think she will tolerate  this dose without problems.  Smoking cessation and EtOH absence have  been reinforced once again.  [name] continues to do poorly.  However,  without her compliance, her prognosis is very poor.  She needs to follow  up with Dr. Gala Romney for a routine cardiology visit.  Will arrange this  at next appointment, also lab work.      [name], ACNP  Electronically Signed      Rollene Rotunda, MD, [facility]  Electronically Signed   MB/MedQ  DD: 07/08/2007  DT: 07/08/2007  Job #: [id number]   cc:   Osvaldo Shipper. Spruill, M.D.
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
## 2010-07-25 NOTE — Assessment & Plan Note (Signed)
[facility]                          CHRONIC HEART FAILURE NOTE   Jessica [name], Jessica [name]                      MRN:          [id number]  DATE:05/15/2007                            DOB:          08-Dec-1960    PRIMARY CARE PHYSICIAN:  Osvaldo Shipper. Spruill, M.D.   PRIMARY CARDIOLOGIST:  Bevelyn Buckles. Bensimhon, M.D.   Jessica [name] is new to the heart failure clinic.  She is a 50 year old  African-American female with a history of congestive heart failure  secondary to ischemic cardiomyopathy status post multiple coronary  artery interventions, also with a history of hypertension,  hyperlipidemia, type 2 diabetes, ongoing tobacco use and medical  noncompliance.  She also has a history of EtOH abuse.  She states she  stopped drinking about a year ago.  She apparently had problems with  binge drinking with beer.  Jessica [name] is a very pleasant young lady.  She lives here in [place] with her son and boyfriend.  She is  disabled because of her health problems.  Just recently discharged home  from [facility] on April 05, 2007, at which time she was  admitted for her acute-on-chronic systolic congestive heart failure.  During that hospitalization, she underwent a right heart catheterization  which demonstrated left-sided heart failure with pulmonary hypertension.  In talking with Jessica [name] today, apparently she stopped taking her  Diovan about a month before she went into the hospital.  She states that  she did not think that she needed it anymore so she stopped it, and  apparently since being discharged home on April 05, 2007, she has  stopped her Plavix also, saying that she did not think she needed it and  has not resumed her Diovan since being discharged.  Jessica [name] also  continues to smoke.  She states she has 1-2 cigarettes a day.  She  states she is sleeping much better.  She sleeps on 2 pillows now, as  previously she was sleeping on 5  pillows.  She denies any orthopnea or  PND.  No peripheral edema.  Denies any palpitations, lightheadedness,  dizziness, presyncope or syncopal episodes.  She also states her weight  has been stable at home, 156 pounds, and her appetite is good.  When I  ask her about her medications, she states that it is not that she cannot  afford them, she just does not think that she needed them.  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  She is also a diabetic but does not  check her CBGs.  She states that her sugars run okay, as far as she  knows.   PAST MEDICAL HISTORY:  1. Congestive heart failure secondary to ischemic cardiomyopathy with      an ejection fraction currently 25-30% confirmed by echocardiogram      on April 28, 2007.  2. Status post right heart catheterization on May 01, 2007.      Right heart pressure demonstrate left-sided heart failure with      resultant secondary pulmonary hypertension.  3. History of coronary artery disease status post multiple      percutaneous interventions and multiple stents.  Last left-sided      heart cath was in October of 2007, at which time the patient      suffered a non-Q-wave MI and underwent catheterization with      percutaneous coronary intervention to in-stent restenosis to a      diagonal stent.  4. Ongoing tobacco use.  5. Dyslipidemia.  6. Type 2 diabetes with noncompliance to CBGs.  7. Medication noncompliance.  8. History of EtOH abuse.  9. Dyslipidemia.   REVIEW OF SYSTEMS:  As stated above; otherwise negative.   CURRENT MEDICATIONS:  1. Aspirin 81 mg daily.  2. Plavix 75 mg daily.  PATIENT HAS STOPPED THIS MEDICINE HERSELF.  3. Lasix 40 mg b.i.d.  4. K-Dur 20 mEq b.i.d.  5. Digoxin 0.25 mg daily.  6. Coreg 3.125 mg b.i.d.  7. Diovan 40 mg daily.  THE PATIENT DISCONTINUED THIS MEDICINE.  8. Crestor 20 mg daily.  THE PATIENT DISCONTINUED THIS MEDICINE.  9.  Amaryl 2 mg daily.   PHYSICAL EXAMINATION:  VITAL SIGNS:  Weight 158 pounds, blood pressure  105/77 with a heart rate of 86.  Jessica [name] is in no acute distress.  HEENT:  Normal.  NECK:  Supple.  JVP around 7 cm at a 45 degree angle.  LUNGS:  Clear to auscultation bilaterally.  CARDIOVASCULAR:  An S1-S2, palpable S3 with a 2/6 systolic ejection  murmur.  ABDOMEN:  Soft, nontender, positive bowel sounds.  LOWER EXTREMITIES:  Without clubbing, cyanosis.  trace edema  bilaterally.  NEUROLOGIC:  Alert and oriented x3.   IMPRESSION:  1. Congestive heart failure secondary to ischemic cardiomyopathy.      Patient with only mild signs of volume overload at this time.  In      the setting of medical noncompliance, I have asked to Jessica [name]      to resume her Plavix, that this is a crucial medication for her.  I      have stopped the Diovan and started her on Atacand 4 mg every day.      I have given her samples to last her for a month, as she now states      that she cannot afford the Diovan.  I have also asked her to      increase her Coreg to 2 tablets at bedtime, so she will be taking      3.125 mg in the morning and 6.25 mg at bedtime.  We have talked      about smoking cessation and the importance in regards to her      overall health, the importance of managing her diabetes more      efficiently, and I have given her a heart failure education packet.      She is to weigh every day and call me if she gains 3 pounds in 2      days.  Will continue her other medications.  I suspect that Ms.      [name] is going to have a very poor prognosis if she continues      with her      current lifestyle in the setting of noncompliance.  I will plan on      seeing her back in 3 weeks for reevaluation.      [name], ACNP  Electronically Signed      Rollene Rotunda, MD,  [facility]  Electronically Signed   MB/MedQ  DD: 05/16/2007  DT: 05/19/2007  Job #: [id number]   cc:   Osvaldo Shipper.  Spruill, M.D.

## 2010-07-25 NOTE — Consult Note (Signed)
[name], [name]             ACCOUNT NO.:  [id number]   MEDICAL RECORD NO.:  [id number]          PATIENT TYPE:  INP   LOCATION:  2927                         FACILITY:  [facility]   PHYSICIAN:  Bevelyn Buckles. Bensimhon, MDDATE OF BIRTH:  [birth year]   DATE OF CONSULTATION:  04/30/2007  DATE OF DISCHARGE:                                 CONSULTATION   Congestive Heart Failure Consult Note   REQUESTING PHYSICIAN:  Osvaldo Shipper. Spruill, M.D.   REASON FOR CONSULTATION:  Advanced congestive heart failure.   HISTORY:  [name] is a very pleasant 50 year old woman with a  history of hypertension, diabetes, tobacco use, and coronary artery  disease.  She is status post several myocardial infarctions and multiple  percutaneous interventions.  She has been previously followed by  [facility] and vascular, and her care has been recently turned  over to Dr. [name].  She was admitted 5 days ago with class IIIB  heart failure symptoms with dyspnea on just minimal exertion, and about  a 15 or 20 pound weight gain.  She has been diuresed about 5 kg of  fluid, and feels somewhat better; however, is still quite fatigued.  She  has unfortunately been intolerant of her ACE inhibitor and beta blocker  due to hypotension.  She had a dose of Coreg, today, and her blood  pressure drop to 70; and she was started back on dopamine; and given a  fluid bolus.  She is not having any chest pain.  She does have some  orthopnea and PND.   She has had an echocardiogram, here in the hospital, which was read as  an ejection fraction of 25%-30%.  However, I went back and reviewed her  echocardiogram and suspect that her ejection fraction is more in the 15%  range.  She just has the basilar posterior wall contracting as well as  just a small part of the apex.  The rest is essentially akinetic.  Her  RV function is also significantly decreased with moderate-to-severe  tricuspid regurgitation.   REVIEW OF  SYSTEMS NOTABLE FOR:  Dyspnea on exertion, weight gain,  abdominal distention.  She denies any fevers or chills.  No nausea or  vomiting.  No focal neurologic symptoms.  No bright red blood per  rectum.  No melena.  The remainder of review of systems is negative  except for HPI.   PROBLEM LIST:  1. Coronary artery disease status post multiple coronary      interventions.  2. Congestive heart failure secondary ischemic cardiomyopathy.  3. Hypertension.  4. Hyperlipidemia.  5. Diabetes.  6. Ongoing with going tobacco use.  7. History of alcohol use.   CURRENT MEDICATIONS:  1. Diovan 80 mg a day, this is on HOLD.  2. Coreg 6.25 b.i.d. which is on HOLD due to hypotension.  3. Aspirin 81.  4. Protonix 40.  5. Lasix 40 IV q.8 h.  6. Potassium 20 b.i.d.  7. Plavix 75 a day.  8. Subcu Lovenox.  9. Lantus insulin 10 units at night.  10.Dopamine currently at 3 mcg/kg/min.   ALLERGIES:  ALTACE.  SOCIAL HISTORY:  She has three kids.  She is currently on disability.  She used to work as a Research officer, political party person.  She smokes about 3-4  cigarettes a day.  She has a history of heavy alcohol use, but quit  several years ago.   FAMILY HISTORY:  Notable for a strong family history of congestive heart  failure.   PHYSICAL EXAM:  GENERAL:  She is lying flat in bed in no acute distress.  VITAL SIGNS:  Respirations unlabored.  Blood pressure is 100/64 with a  mean of 70.  Heart rate is 105.  She is in the high 90s on 2 liters  nasal cannula.  HEENT:  Normal.  NECK:  Supple.  JVP is about 9 cm of water.  Carotids are 2+ bilaterally  without bruits.  There is no lymphadenopathy or thyromegaly.  CARDIAC:  PMI is laterally displaced.  She is regular.  She has a  palpable S3 with 2/6 tricuspid regurgitation murmur.  LUNGS:  Clear.  ABDOMEN:  Soft, nontender, nondistended.  She has a mild hepatomegaly.  There are no bruits or masses.  EXTREMITIES:  Warm.  She has 2+ edema from mid calf down.  She  says that  this is much better than it was previously.  There is no cyanosis or  clubbing.  No rash.  NEURO:  Alert and oriented x3.  Cranial nerves II-XII are intact.  Moves  all 4 extremities without difficulty.  Affect is pleasant.   LABS:  Show sodium 138, potassium 3.6, chloride 101, bicarb 26, glucose  of 185, BUN of 9, creatinine 0.91.  BNP is 917.  It was 1056 on  admission.  Cardiac markers have been negative.  White count 3.8,  hemoglobin 11.6 and platelet are 263.  EKG shows sinus tachycardia with  left atrial enlargement.  There is nonspecific T-wave flattening.  QRS  duration is 86 msec.  Chest x-ray has not repeated since February 13,  and at that time showed acute pulmonary edema and cardiomegaly.  The  cardiomegaly is worse since previous.   ASSESSMENT/PLAN:  1. A Class IIIB to IV systolic heart failure with a low output.      Intolerance to ACE inhibitors and beta-blockers.  2. Severe biventricular dysfunction.  3. Coronary artery disease status post multiple interventions.  4. Diabetes.  5. Tobacco use.   PLAN/DISCUSSION:  Ms. [name] has severe biventricular dysfunction with  evidence of low output.  She still has moderate volume overload.   RECOMMENDATIONS:  At this point I would recommend:  1. Place a PICC line for intravenous infusion and also measure CVPs.  2. Start milrinone 0.25 mcg/kg/min.  3. Start digoxin 0.25 mg a day.  4. Change her to a Lasix drip and diurese slowly.  5. Hold the beta blocker for now.  6. Titrate vasodilators as tolerated.  7. She will need a right heart cath prior to discharge, and possibly      an ICD as well.   Given her young age and advanced heart failure, she may need an  evaluation for advanced therapy such as transplant or LVAD, if she is  not responding to medical device therapy in the near future.  Of note,  though she will need at least 6 months free of tobacco to qualify.  I  have discussed the situation with Dr.  Shana Chute.  We will transfer her to  our service for continued management of her heart failure, and he will  follow  with [place].  We appreciate the consult.      Bevelyn Buckles. Bensimhon, MD  Electronically Signed     DRB/MEDQ  D:  04/30/2007  T:  05/01/2007  Job:  [id number]

## 2010-07-25 NOTE — Assessment & Plan Note (Signed)
[facility]                          CHRONIC HEART FAILURE NOTE   [name], Jessica                      MRN:          [id number]  DATE:03/19/2008                            DOB:          1960/04/23    PRIMARY CARDIOLOGIST:  Bevelyn Buckles. Bensimhon, MD   PRIMARY CARE PHYSICIAN:  Osvaldo Shipper. Spruill, M.D.   HISTORY OF PRESENT ILLNESS:  Jessica [name] returns today for further followup  of her congestive heart failure, which is secondary to ischemic  cardiomyopathy.  I last saw her in November 2009, at which time she had  signs of volume overload in the setting of ongoing EtOH and tobacco  abuse.  She followed up with Dr. Gala Romney since that time.  Went ahead  and proceeded with implantation of a single chamber defibrillator.  This  was done February 17, 2008.  The patient presented for her post  hospital/wound check, was doing okay.  Today she comes and tell me she  has had some drainage from her ICD site for approximately 1 week.  Initially a serous drainage now with some purulent discharge.  She  denies any fever or chills.  Also complaining of increased weight in the  setting of tobacco abstinence and deconditioning.  She denies any  orthopnea or PND.  Denies any firing from her device.  Denies any  presyncope or syncopal episodes.  She does complain of feeling sleepy  and sluggish over the last week or so.   PAST MEDICAL HISTORY:  Includes  1. Congestive heart failure secondary to severe ischemic      cardiomyopathy with EF of 20-25% status post recent implantation of      a Biotronik single-chamber ICD by Dr. Lewayne Bunting in December      2009.  2. Coronary artery disease status post multiple PCI.  3. Ongoing tobacco and EtOH abuse.  4. History of medical noncompliance.  5. Type 2 diabetes.   REVIEW OF SYSTEMS:  As stated above, otherwise negative.   CURRENT MEDICATIONS:  Include Lasix 80 mg b.i.d., Coreg 3.125 mg daily,  Atacand 8 mg, aspirin 81,  Plavix 75, glimepiride 2 mg, digoxin 0.25,  Crestor 40, spironolactone 12.5, KCl 40 in the a.m. 20 in the evening.   PHYSICAL EXAMINATION:  VITAL SIGNS:  Weight 179 pounds, weight is up 3  pounds.  Blood pressure 113/79, heart rate of 80.  GENERAL:  [name] is in no acute distress.  NECK:  Supple without obvious JVD.  CARDIOVASCULAR:  Reveals S1 and S2, 2/6 systolic ejection murmur noted.  I do not hear an S3 at this time.  There is some mild dry serous  drainage over her defibrillator site.  I do not notice any erythema or  swelling.  LUNGS:  Clear to auscultation.  ABDOMEN:  Distended, soft, and nontender. Positive bowel sounds.  EXTREMITIES:  Lower extremities without clubbing, cyanosis or edema.  NEUROLOGIC:  Alert and oriented x3   IMPRESSION:  Congestive heart failure secondary to ischemic  cardiomyopathy status post recent placement of implantable cardioverter-  defibrillator.  Inetta continues to gain weight, but has no signs  of  obvious volume overload.  When the patient saw Dr. Gala Romney back in  December he had apparently asked her to increase her Atacand to b.i.d.  Rylen does not recall having this conversation therefore has not  increased her Atacand.  I am going to see if she can tolerate a bump in  her Coreg to b.i.d. today, we have not been successful in the past and I  am not sure that she will be compliant with this.  Also go ahead and  check labs on her today.  I have talked with Dr. Ladona Ridgel regarding her  implantable cardioverter-defibrillator site.  He feels that she may have  a localized infection with one of the sutures he has done some, we will  follow up on this today and I will plan on seeing Pleasant back in a  couple of weeks.      [name], ACNP  Electronically Signed      Rollene Rotunda, MD, [facility]  Electronically Signed   MB/MedQ  DD: 03/19/2008  DT: 03/20/2008  Job #: [id number]

## 2010-07-25 NOTE — Assessment & Plan Note (Signed)
[facility]                          CHRONIC HEART FAILURE NOTE   Jessica, [name]                      MRN:          [id number]  DATE:12/11/2007                            DOB:          10-10-1960    PRIMARY CARDIOLOGIST:  Bevelyn Buckles. Bensimhon, MD   PRIMARY CARE:  Osvaldo Shipper. Spruill, MD   Jessica [name] returns today for further followup of her congestive heart  failure which is secondary to ischemic cardiomyopathy with EF of about  20-25% confirmed by recent echocardiogram done on December 10, 2007.  Jessica [name] also has a history of ETOH abuse and ongoing tobacco use.  She  returns today for further followup, recently saw Dr. Gala Romney on  November 28, 2007, at which time she presented with ongoing class III  to IIIB New York Heart Association symptoms, status post recent  cardiopulmonary exercise test concerning for six significant cardiac  limitations.  I had previously cut Jessica [name]'s carvedilol back to 3.125 mg  at bedtime because she was so insensitive to the medication b.i.d. that  she completely stopped it.  She did okay with the evening dose and  states she has increased it back to b.i.d. since she saw Dr. Gala Romney  and continues to do okay at this dose now.  He also felt she had some  fluid on her, increased her Lasix to 80 mg b.i.d. for 2 days and then  back to 80 and 40.  Jessica [name] is also being considered for ICD therapy.  She has an appointment with Dr. Ladona Ridgel on October 7, I believe for  further evaluation.  She complains of ongoing fatigue, shortness of  breath with minimal exertion and abdominal distention with a  nonproductive cough.  Note, these are usually symptoms of increased  volume overload for her.   PAST MEDICAL HISTORY:  1. Congestive heart failure secondary to severe ischemic      cardiomyopathy with EF of 20-25%.  2. Coronary artery disease status post multiple PCIs.  3. Ongoing tobacco use.  4. History of ETOH abuse.  5.  History of medical noncompliance.  6. Dyslipidemia.  7. Type 2 diabetes.  8. Status post recent CPX test.  Submaximal effort with a peak RVR of      0.95, peak pO2 of 11.7 which was 48% of predicted; however, her      slope was markedly elevated at 43.  Results were felt to be      reflective of very poor cardiac status.   REVIEW OF SYSTEMS:  As stated above.  Otherwise negative.   CURRENT MEDICATIONS:  1. Atacand 8 mg.  2. Aspirin 81.  3. Plavix 75.  4. Glimepiride 2 mg daily.  5. Digoxin 0.25 mg daily.  6. Crestor 40 daily.  7. Spironolactone 12.5 daily.  8. KCl 20 mEq two in the a.m. and one in the evening.  9. Carvedilol 3.125 mg b.i.d.   PHYSICAL EXAMINATION:  VITAL SIGNS:  Weight 169 pounds, weight is up 7  pounds from just 2-1/2 weeks ago, blood pressure 104/80 with a heart  rate of 87.  GENERAL:  Jessica [name] is  in no acute distress.  JVD around 8 cm at 45-degree  angle.  LUNGS:  Clear to auscultation bilaterally.  CARDIOVASCULAR:  S1 and S2.  I do not hear S3 at this time.  ABDOMEN:  Distended, soft, nontender, positive bowel sounds.  LOWER EXTREMITIES:  Without clubbing, cyanosis or edema.   IMPRESSION:  Congestive heart failure secondary to ischemic  cardiomyopathy with signs of volume overload at this time.  I am going  to increase Jessica [name]'s Lasix to 80 mg b.i.d.  She has an appointment with  Dr. Ladona Ridgel next week for consideration of ICD.  We will have him  reevaluate her fluid volume status at that time.  Also arrange for a  BMET and a BNP to be drawn at that time with a protein level.      Jessica [name], ACNP  Electronically Signed      Jessica Rotunda, MD, [facility]  Electronically Signed   MB/MedQ  DD: 12/11/2007  DT: 12/12/2007  Job #: [id number]

## 2010-07-26 ENCOUNTER — Ambulatory Visit: Payer: Self-pay | Admitting: Internal Medicine

## 2010-07-27 ENCOUNTER — Encounter: Payer: Medicare Other | Admitting: *Deleted

## 2010-07-27 ENCOUNTER — Ambulatory Visit: Payer: Medicare Other | Admitting: Vascular Surgery

## 2010-07-27 ENCOUNTER — Other Ambulatory Visit (INDEPENDENT_AMBULATORY_CARE_PROVIDER_SITE_OTHER): Payer: Medicare Other | Admitting: *Deleted

## 2010-07-27 ENCOUNTER — Encounter (INDEPENDENT_AMBULATORY_CARE_PROVIDER_SITE_OTHER): Payer: Medicare Other

## 2010-07-27 DIAGNOSIS — I7092 Chronic total occlusion of artery of the extremities: Secondary | ICD-10-CM

## 2010-07-27 DIAGNOSIS — I5022 Chronic systolic (congestive) heart failure: Secondary | ICD-10-CM

## 2010-07-27 LAB — BASIC METABOLIC PANEL
BUN: 13 mg/dL (ref 6–23)
Calcium: 9.1 mg/dL (ref 8.4–10.5)
GFR: 84.59 mL/min (ref >60.00)
Potassium: 5 mEq/L (ref 3.5–5.1)
Sodium: 133 mEq/L — ABNORMAL LOW (ref 135–145)

## 2010-07-28 ENCOUNTER — Emergency Department (HOSPITAL_COMMUNITY): Payer: Medicare Other

## 2010-07-28 ENCOUNTER — Inpatient Hospital Stay (HOSPITAL_COMMUNITY)
Admission: EM | Admit: 2010-07-28 | Discharge: 2010-08-01 | DRG: 292 | Disposition: A | Discharge status: Home / Self Care | Payer: Medicare Other | Attending: Cardiology | Admitting: Cardiology

## 2010-07-28 ENCOUNTER — Telehealth: Payer: Self-pay | Admitting: Internal Medicine

## 2010-07-28 DIAGNOSIS — I472 Ventricular tachycardia, unspecified: Secondary | ICD-10-CM | POA: Diagnosis present

## 2010-07-28 DIAGNOSIS — E119 Type 2 diabetes mellitus without complications: Secondary | ICD-10-CM | POA: Diagnosis present

## 2010-07-28 DIAGNOSIS — Z7901 Long term (current) use of anticoagulants: Secondary | ICD-10-CM

## 2010-07-28 DIAGNOSIS — E785 Hyperlipidemia, unspecified: Secondary | ICD-10-CM | POA: Diagnosis present

## 2010-07-28 DIAGNOSIS — R0602 Shortness of breath: Secondary | ICD-10-CM

## 2010-07-28 DIAGNOSIS — F1010 Alcohol abuse, uncomplicated: Secondary | ICD-10-CM | POA: Diagnosis present

## 2010-07-28 DIAGNOSIS — Z91199 Patient's noncompliance with other medical treatment and regimen due to unspecified reason: Secondary | ICD-10-CM

## 2010-07-28 DIAGNOSIS — Z9119 Patient's noncompliance with other medical treatment and regimen: Secondary | ICD-10-CM

## 2010-07-28 DIAGNOSIS — I2589 Other forms of chronic ischemic heart disease: Secondary | ICD-10-CM | POA: Diagnosis present

## 2010-07-28 DIAGNOSIS — I5023 Acute on chronic systolic (congestive) heart failure: Principal | ICD-10-CM | POA: Diagnosis present

## 2010-07-28 DIAGNOSIS — E876 Hypokalemia: Secondary | ICD-10-CM | POA: Diagnosis present

## 2010-07-28 DIAGNOSIS — R609 Edema, unspecified: Secondary | ICD-10-CM

## 2010-07-28 DIAGNOSIS — Z79899 Other long term (current) drug therapy: Secondary | ICD-10-CM

## 2010-07-28 DIAGNOSIS — I739 Peripheral vascular disease, unspecified: Secondary | ICD-10-CM | POA: Diagnosis present

## 2010-07-28 DIAGNOSIS — I10 Essential (primary) hypertension: Secondary | ICD-10-CM | POA: Diagnosis present

## 2010-07-28 DIAGNOSIS — I252 Old myocardial infarction: Secondary | ICD-10-CM

## 2010-07-28 DIAGNOSIS — Z86718 Personal history of other venous thrombosis and embolism: Secondary | ICD-10-CM

## 2010-07-28 DIAGNOSIS — I2510 Atherosclerotic heart disease of native coronary artery without angina pectoris: Secondary | ICD-10-CM | POA: Diagnosis present

## 2010-07-28 DIAGNOSIS — I4729 Other ventricular tachycardia: Secondary | ICD-10-CM | POA: Diagnosis present

## 2010-07-28 DIAGNOSIS — F17200 Nicotine dependence, unspecified, uncomplicated: Secondary | ICD-10-CM | POA: Diagnosis present

## 2010-07-28 DIAGNOSIS — Z95810 Presence of automatic (implantable) cardiac defibrillator: Secondary | ICD-10-CM

## 2010-07-28 DIAGNOSIS — I509 Heart failure, unspecified: Secondary | ICD-10-CM | POA: Diagnosis present

## 2010-07-28 DIAGNOSIS — Z9861 Coronary angioplasty status: Secondary | ICD-10-CM

## 2010-07-28 LAB — PROTIME-INR: Prothrombin Time: 21.8 seconds — ABNORMAL HIGH (ref 11.6–15.2)

## 2010-07-28 LAB — BASIC METABOLIC PANEL
Calcium: 9.7 mg/dL (ref 8.4–10.5)
GFR calc Af Amer: >60 mL/min (ref >60)
GFR calc non Af Amer: >60 mL/min (ref >60)
Sodium: 132 mEq/L — ABNORMAL LOW (ref 135–145)

## 2010-07-28 LAB — CBC
Hemoglobin: 10.5 g/dL — ABNORMAL LOW (ref 12.0–15.0)
MCHC: 32.9 g/dL (ref 30.0–36.0)
Platelets: 238 10*3/uL (ref 150–400)
RBC: 4.18 MIL/uL (ref 3.87–5.11)

## 2010-07-28 LAB — GLUCOSE, CAPILLARY: Glucose-Capillary: 174 mg/dL — ABNORMAL HIGH (ref 70–99)

## 2010-07-28 LAB — DIFFERENTIAL
Basophils Absolute: 0 10*3/uL (ref 0.0–0.1)
Basophils Relative: 1 % (ref 0–1)
Monocytes Absolute: 0.3 10*3/uL (ref 0.1–1.0)
Neutro Abs: 2.5 10*3/uL (ref 1.7–7.7)
Neutrophils Relative %: 59 % (ref 43–77)

## 2010-07-28 NOTE — Telephone Encounter (Signed)
Pt states she if full of fluid.  Wt is now 171.  Having shortness of breath x 2 days.  Edema in both legs, when she is resting feels heavy in chest, pt is also coughing.  Pt aware this is being sent as urgent call.

## 2010-07-28 NOTE — Cardiovascular Report (Signed)
Jessica [name], Jessica [name]             ACCOUNT NO.:  [id number]   MEDICAL RECORD NO.:  [id number]          PATIENT TYPE:  INP   LOCATION:  6522                         FACILITY:  [facility]   PHYSICIAN:  Nicki Guadalajara, M.D.     DATE OF BIRTH:  07-30-1960   DATE OF PROCEDURE:  DATE OF DISCHARGE:  12/04/2003                              CARDIAC CATHETERIZATION   PROCEDURE:  Cardiac catheterization and percutaneous coronary intervention.   INDICATIONS:  [name] is a 50 year old African-American female  who suffered myocardial infarction in 1996.  Here for PTCA.  In July 2003,  she underwent stenting of a high obtuse marginal vessel.  She is status post  PTCA cutting balloon and stenting of the ostium of proximal diagonal vessel  as well as the LAD just beyond the diagonal vessel in September 2004.  In  April 2005, she presented with an acute inferior MI and was found to have an  occluded mid right coronary artery, for which she underwent CYPHER stenting  successfully.  It was also noted that she had smooth 60-70% narrowing in the  LAD diagonal bifurcation areas, just before the stented segment at that  time.  A subsequent Cardiolite study did not show an LAD ischemia.  The  patient had been doing well on medical therapy.  However, she again  developed recurrent episodes of chest pain, leading to her hospitalization,  which symptom complex was suggestive of unstable angina.  Definite repeat  catheter was recommended.   PROCEDURE:  After premedication with Versed, initially 2 and subsequently  and additional 2 + 1 mg, the patient was prepped and draped in the usual  fashion.  Her right femoral artery was punctured anteriorly, and initially a  #5 [place] sheath was inserted without difficulty.  Diagnostic  catheterization was done with #5 [place] Judkins left and right coronary  catheters.  A pigtail catheter was used for biplane scintigraphy.  With the  demonstration of progressive  stenoses in the bifurcation of the LAD and  diagonal with 80% stenosis at the ostium of the diagonal before the stented  segment and an 80% stenosis in the mid diagonal just after the stented  segment as well as a 70% stenosis in the LAD at its bifurcation, just  proximal to the stented segment, the decision was made to attempt  percutaneous coronary intervention with cutting balloon atherotomy.  The  sheath was upgraded to a #6 [place] system.  A Cordis XD 3.5 guide was used  for the procedure.  Because of the bifurcation lesions, 2 luge wires were  inserted with 1 wire being advanced down the diagonal vessel, the second  wire being advanced down the LAD.  A 3.25 x 10 mm Flextome new cutting  balloon device was advanced over the wire, supplying the diagonal vessel.  Initial dilatations were done, 1, 2, 4 and ultimately up to 6 atmospheres at  the ostium, extending into the proximal stented segment.  Additional  inflations were also made in the lesion distal to the stented segment with  inflations again at 2, 4, 5 and ultimately 6 atmospheres, corresponding  to a  3.26  mm size.  IC nitroglycerin was also administered.  With the  demonstration of good angiographic results at both sites in the diagonal  vessel, attention was then directed at the LAD, itself.  The cutting balloon  was then advanced over the wire in the LAD and bifurcation cutting balloon  was done in the LAD with numerous inflations made with ultimate dilatation  up to 3.41 mm.  Scout angiography confirmed and excellent angiographic  result.  There was TIMI 3 flow in both vessels.  There was no evidence for  dissection.  During the procedure, ACT was recorded to be therapeutic.  The  patient did receive several doses of intracardiac nitroglycerin and also  received 2 doses of 25 mg Fentanyl.   HEMODYNAMIC DATA:  Central aortic pressure was 105/72.  Left heart pressure  was 105/7, post A wave 18.   ANGIOGRAPHIC DATA:  The  left main coronary artery was angiographically  normal and bifurcated into an LAD and left circumflex system.   The LAD gave rise to a proximal diagonal vessel.  There were stents present  in the proximal diagonal as well as the LAD just after the diagonal takeoff.  There was 80% focal stenosis just proximal to the stented segment at the  ostium in the diagonal and also 80% stenosis just distal to the stented  segment in this diagonal vessel.  This had been a 3.0 x 13 mm CYPHER stent.  There also was 70% stenosis in the LAD just beyond the diagonal takeoff  proximal to the stented segment.  There was no restenosis in either stented  regions.   The remainder of the LAD was free of significant disease.   The circumflex vessel was a moderate size vessel that gave rise to a very  high OM1 vessel, which almost had a ramus intermediate like distribution.  There was a widely patent stent in the proximal portion of this high  marginal vessel.   The right coronary artery had 10-20% narrowing that was smooth in the  previously placed stent from the patient's recent myocardial infarction.  The remainder of the RCA was free of significant disease.   BIPLANE SCINTIGRAPHY:  Revealed normal LV contractility.  There were no  focal segmental wall motion abnormalities.   Following double wire, percutaneous coronary intervention with Flextome  cutting ballon atherotomy device in the diagonal, the 80% ostial and 80% mid  sites before and after the previously stented segments were significantly  improved.  The ostial 80% site was reduced to 0% and the site beyond the  stented segment was reduced to less than 10%.  The 70% LAD site just beyond  the diagonal takeoff was reduced to less than 10%.  There was TIMI 3 flow.  There was no evidence for dissection.   IMPRESSION:  1.  Normal LV function.  2.  Ostial/proximal 80% stenosis to the previously placed diagonal stent and     80% just after the  previously placed diagonal stent in the LAD with 70%      stenosis in the LAD just after the diagonal takeoff prior to the      previously stent.  3.  Widely patent stent in the obtuse marginal vessel and otherwise normal      circumflex system.  4.  No restenosis in the stent in the right coronary artery with smooth      narrowing of 10 and less than 20%.  5.  Successful cutting  balloon atherotomy of the bifurcation diagonal LAD      lesions with the ostial 80% diagonal      site being reduced to 0%, the mid diagonal site being reduced to less      than 10%, and the LAD site just beyond the diagonal takeoff being      reduced to 70%, utilizing double bolus Integrilin and weight adjusted      heparinization.       TK/MEDQ  D:  12/03/2003  T:  12/04/2003  Job:  [id number]   cc:   Dr. Jarold Motto   Cardiac Catheterization Laboratory   Olene Craven, M.D.  [address]  [address]  [place]  [place] [address]  Fax: [phone]

## 2010-07-28 NOTE — Cardiovascular Report (Signed)
NAMEMARYBELL, Jessica [name]             ACCOUNT NO.:  [id number]   MEDICAL RECORD NO.:  [id number]          PATIENT TYPE:  INP   LOCATION:  2908                         FACILITY:  [facility]   PHYSICIAN:  Thereasa Solo. Little, M.D. DATE OF BIRTH:  21-Jan-1961   DATE OF PROCEDURE:  01/09/2006  DATE OF DISCHARGE:                              CARDIAC CATHETERIZATION   INDICATIONS FOR TEST:  This 50 year old diabetic female has had multiple  percutaneous interventions and has multiple stents.  Her last  catheterization and intervention was March 2007.   She presented to [facility] with chest pain, had a normal  EKG, low positive enzymes, and was transferred to [facility].  She was  brought to the catheterization lab for evaluation for what appeared to be a  non-Q-wave myocardial infarction.  She was, by the time I brought to the  catheterization lab,  beginning to have recurrent episodes of chest  discomfort.  She was on IV heparin and nitroglycerin.   After obtaining informed consent, the patient was prepped and draped in the  usual sterile fashion exposing the right groin; 5 mg of 1% Xylocaine and  Seldinger technique employed. A 6-French introducer sheath was placed in the  right femoral artery.  Left and right coronary arteriography and  percutaneous intervention to her RCA and optional diagonal were performed.  No ventriculogram was performed.   COMPLICATIONS:  None.   TOTAL CONTRAST USED:  150 mL.   DIAGNOSTIC CATHETERS:  5-French Judkins configuration catheters with a 6-  French introducer sheath in the right femoral artery.   MEDICATIONS:  Angiomax IV and Plavix 10 mg p.o. for the procedure. (She is  on chronic Plavix therapy already.).   RESULTS:  Hemodynamic monitoring:  Her central aortic pressure was 133/86.  She remained in sinus rhythm with a rate in the 80s during the procedure.   1. Left main normal.  2. LAD: There was a stent in the mid portion of the LAD  just distal to the      first diagonal.  In the distal portion of the stent was an area of 30-      40% haziness that appeared to be in-stent restenosis.  The distal LAD      extended down across the apex of the heart, had no significant disease.      There was a large first diagonal branch that had only minimal      irregularities.  There was a large left-to-right collateral bed noted.  3. Optional diagonal: The optional diagonal was a moderate-size vessel. It      had a stent in the proximal segment that did not extend to the ostium.      Within the stent was an area of 80% haziness with TIMI-2 flow      consistent with in-stent restenosis. The distal optional diagonal      vessel, which was a large vessel, was free of significant disease.  4. Circumflex: The AV groove circumflex was free of disease as was the      second OM.  The first OM had a  stent in the most proximal segment, but      it did not cover the ostium. The stent itself was widely patent.      However, the ostium had 60% narrowing.  The distal portion of the OM      was free of disease.  There was a large left-to-right collateral      filling of the RCA from the circumflex system.  5. Right coronary artery.  This vessel was 100% occluded proximally.  This      vessel was patent at the March 2007 catheterization.  The stents were      seen on fluoroscopy in the mid portion of the vessel, and the      collaterals from the left system extend all the way back to the distal      portion of the stents.   The patient was given IV Angiomax, and arrangements were made for  percutaneous intervention.  The first intervention that involved the right  coronary artery.   A JL-4 guide catheter and a short Luge wire were used.  I was able to pass  the Luge wire down the proximal portion of the vessel, but I could never be  sure I was in the true lumen.  I could never get contrast in the distal  portion of the vessel.  I could not  advance the wire past the most proximal  portion of the stent.  Buddy wire using the Prowater wire was used.  I was  able to pass this down the proximal RCA.  It took the very same path as the  Luge wire, but, again, it would never cross the proximal portion of the  stent. I never had contrast flow around the wires, and I was never sure I  was in the true lumen.  At this point, I abandoned intervention to the RCA  since she had a large collateral bed.  I was unable to cross the lesion and  be sure that I was within the true lumen.   The second intervention involved the optional diagonal.  There was 80% in-  stent restenosis.  The Prowater wire was used, and a JR-4 6-French guide  catheter was used.  Because of the in-stent restenosis, a 2.5 x 10 cutting  balloon was positioned in such a manner that it stayed within the stent the  entire time, and a series of six inflations were performed, the maximum  being 9 atmospheres for 50 seconds.  After the series of inflations were  performed, the area that was 80% narrowed pre-intervention was now less than  20% narrowed post intervention.  There was TIMI-2 flow initially and TIMI-3  following the procedure.   Because of the above-mentioned other areas of concern, this young lady will  need a nuclear study performed as an outpatient to evaluate residual  ischemic burden.  This will also give [place] an opportunity to assess her LV  function.  It may be that she will need bypass surgery since she is diabetic  and has had multiple, multiple percutaneous interventions and has areas of  in-stent restenosis.           ______________________________  Thereasa Solo Little, M.D.     ABL/MEDQ  D:  01/09/2006  T:  01/09/2006  Job:  [id number]   cc:   Nicki Guadalajara, M.D.  Catheterization Lab

## 2010-07-28 NOTE — Discharge Summary (Signed)
NAMEMAKAI, AGOSTINELLI                         ACCOUNT NO.:  [id number]   MEDICAL RECORD NO.:  [id number]                   PATIENT TYPE:  INP   LOCATION:  6532                                 FACILITY:  [facility]   PHYSICIAN:  Nanetta Batty, M.D.                DATE OF BIRTH:  24-May-1960   DATE OF ADMISSION:  11/24/2002  DATE OF DISCHARGE:  11/26/2002                                 DISCHARGE SUMMARY   DISCHARGE DIAGNOSES:  1. Unstable angina.  Negative myocardial infarction.  2. Coronary artery disease with cutting balloon angioplasty to the Cypher     stent of first diagonal and left anterior descending.  3. Hyperglycemia.  4. Hyperlipidemia.  5. Hypertension.   CONDITION ON DISCHARGE:  Improved.   PROCEDURES:  Combined left heart catheterization on November 25, 2002, by  Nicki Guadalajara, M.D., with cutting balloon angioplasty and placement Cypher  stent to first diagonal and left anterior descending.   DISCHARGE MEDICATIONS:  1. Zocor 40 mg one every evening.  2. Toprol XL 50 mg daily.  3. Plavix 75 mg daily.  4. Enteric-coated aspirin 81 mg daily.  5. Nitroglycerin 1/150 under the tongue as needed for chest pain.   DISCHARGE INSTRUCTIONS:  1. No work until after appointment with Dr. Tresa Endo.  No lifting over 10     pounds, no strenuous activity, and no sexual activity for three days, and     then resume regular activity.  2. Low-fat, low-salt diet.  Watch sugar and concentrated foods.  3. Wash the right groin catheterization site with soap and water.  Call if     any bleeding, swelling, or drainage.  4. Follow up with Dr. Tresa Endo on December 18, 2002, at 10:45 a.m.   HISTORY OF PRESENT ILLNESS:  This is a 50 year old African American female  admitted to [facility] for evaluation of chest pain.  She has a  history of cardiac disease dating back to 1996 when she had an acute  anterior MI and was hospitalized at [facility].  She did have a  cardiac arrest at  that time and underwent angioplasty of the LAD.  In July  of 2003, she had recurrent chest pain.  Cardiac catheterization revealed  proximal high obtuse marginal branch stenosis.  She underwent successful  stenting.   The patient had been doing well until November 24, 2002.  While lying in  bed, she experienced an episode of chest pain.  It was described as  substernal pressure associated with an ache in her left arm.  She had mild  dyspnea, but no diaphoresis or nausea.  No exacerbating factors.  It was not  related to position, activity, meals, or respiration.  The chest pain  resolved 30 minutes after her second nitroglycerin.   The left arm discomfort took nearly three hours to resolve and that is what  prompted her to come to the emergency  room.   PAST MEDICAL HISTORY:  1. Coronary disease as described.  2. Hypertension.  3. Hyperlipidemia, treated.  4. Continues to smoke.   PAST SURGICAL HISTORY:  Angioplasty and stents as described.   FAMILY HISTORY:  See H&P.   SOCIAL HISTORY:  See H&P.   REVIEW OF SYSTEMS:  See H&P.   ALLERGIES:  No allergies.   REVIEW OF SYSTEMS:  See H&P.   PHYSICAL EXAMINATION AT DISCHARGE:  VITAL SIGNS:  Blood pressure 106/62,  pulse 64, respirations 18, temperature 97.4 degrees, room air saturation  99%.  HEART:  S1 and S2 regular rate and rhythm.  NECK:  Supple.  No JVD.  LUNGS:  Without rales.  GROIN:  Stable.   LABORATORY DATA:  Admitting labs:  Hemoglobin 13.1, hematocrit 38.3,  platelets 322, WBC 6.4.  Chemistries:  Sodium 136, potassium 3.6, chloride  105, CO2 26, glucose 137, BUN 8, creatinine 0.8, calcium 8.6.  Glycohemoglobin was still pending.  Cardiac enzymes:  CK 70, MB 1.9.   EKG:  Sinus rhythm, normal EKG.  This was the one done on November 26, 2002.  She had EKGs done at [facility].  I cannot find those at  this time.   HOSPITAL COURSE:  Ms. Harewood was admitted to the hospital on nitroglycerin  paste.   Lovenox, Plavix, and Toprol were continued.  Instructed to stop  smoking.  Cardiac enzymes were done.  Cardiac enzymes were negative.  Plans  were made for the patient to have cardiac catheterization.  She continued  with occasional chest pain.   On November 25, 2002, she underwent cardiac catheterization and received a  Cypher to the diagonal and LAD as described.  See the dictated note for  completed details.  She does have residual disease in her RCA at 40-50%  maximum and some small vessel disease in addition.  The patient's EF was  within normal limits and no renal artery stenosis.   By November 26, 2002, she was stable with no further chest pain.  Dr. Tresa Endo  saw her assessed her and felt she was ready for discharge home.  Due to her  hyperglycemia, glycohemoglobin was done.  Unfortunately those results are  still not back in the chart and will need to be followed up as an  outpatient.      Darcella Gasman. Valarie Merino                     Nanetta Batty, M.D.    LRI/MEDQ  D:  12/20/2002  T:  12/20/2002  Job:  [id number]

## 2010-07-28 NOTE — Cardiovascular Report (Signed)
Jessica [name], Jessica [name]                         ACCOUNT NO.:  [id number]   MEDICAL RECORD NO.:  [id number]                   PATIENT TYPE:  INP   LOCATION:  2931                                 FACILITY:  [facility]   PHYSICIAN:  Cristy Hilts. Jacinto Halim, M.D.                  DATE OF BIRTH:  1961/01/16   DATE OF PROCEDURE:  07/10/2003  DATE OF DISCHARGE:                              CARDIAC CATHETERIZATION   PROCEDURE PERFORMED:  1. Left ventriculography.  2. Selective left and right coronary angiography.  3. Right innominate arteriography with visualization of the right internal     mammary artery and left subclavian arteriography with visualization of     the left internal mammary artery.  4. Right femoral angiography.  5. Percutaneous transluminal coronary angioplasty and stenting of the mid     right coronary artery with a 3.5 x 18 mm Cypher stent.  6. Right femoral angiography with closure of the right femoral artery access     with AngioSeal.  7. Urgent ReoPro and IV heparin used.   CARDIOLOGIST:  Cristy Hilts. Jacinto Halim, M.D.   ATTENDING CARDIOLOGIST:  Nanetta Batty, M.D.   INDICATIONS FOR PROCEDURE:  [name] is a 50 year old African  American female with a history of hypertension, hyperlipidemia, history of  previous coronary artery disease status post anterior wall myocardial  infarction status post angioplasty to her LAD on November 25, 2002, with a  3.0 x 13 in the diagonal I and a 3.5 x 18 mm stent in the mid LAD performed  by Dr. Nicki Guadalajara and PTCA and stenting of the high obtuse marginal I  performed on September 25, 2000, with a 2.5 x 18 mm Cypher for angina pectoris.  She was admitted through [facility] with chest pain suggestive of  unstable angina.  She continued to have recurrent chest pain in spite of  aspirin and heparin and repeat EKG showing ST elevation in the inferior  leads with reciprocal ST depression in I and aVL.  Given this, she was  brought to the  cardiac catheterization lab on an emergent basis for  evaluation of her coronary anatomy.   FINDINGS:  HEMODYNAMIC DATA:  The left ventricular pressures were 122/17  with an end-diastolic pressure of 21 mmHg.  The aortic pressure was 146/99  with a mean of 120 mmHg.  There was no pressure gradient across the aortic  valve.   ANGIOGRAPHIC DATA:  1. The left ventricular systolic function was in the lower limits of normal.     The ejection fraction was estimated at 50-55% with mild inferior     hypokinesis.  There was no significant mitral regurgitation.  2. The right coronary artery is a large dominant vessel.  It is occluded in     its midsegment.  The distal right coronary artery has faint contralateral     collaterals.  3. The left main coronary  artery is a large caliber vessel.  It is normal.  4. Left anterior descending artery:  The left anterior descending artery is     a large caliber vessel and gives origin to a large diagonal I.  The     previously placed stent in the diagonal and the LAD are widely patent.     However, the inflow of both stents has 60-70% stenosis which are very     focal.  The LAD ends after reaching the apex.  The LAD has mild luminal     irregularity.  5. The circumflex coronary artery is a large caliber vessel.  It is smooth     and appears to be codominant with the RCA.  The obtuse marginal I     previously placed stent is widely patent.  The circumflex itself has mild     luminal irregularity.  There are faint collaterals noted to the distal     right coronary artery.   INTERVENTIONAL DATA:  Percutaneous transluminal coronary angioplasty and  stenting of the mid right coronary artery with a 3.5 x 18 mm Cypher stent  deployed at 16 atmospheres of pressure.  The stenosis was reduced from 100%  to 0% with TIMI-0 to TIMI-3 flow at the end of the procedure with no  evidence of dissection and no evidence of thrombus at the end of the  procedure with no  evidence of dissection and no evidence of thrombus at the  end of the procedure.   RECOMMENDATIONS:  The patient will be continued on risk factor modification.  She may need to be evaluated for LAD stenosis by either an outpatient  Cardiolite stress test or evaluation by IVUS when she is more stable.   We will go and stress to the patient her medical compliance.  Smoking  cessation is indicated.  ion of native coronary artery disease.   DESCRIPTION OF PROCEDURE:  Under the usual sterile preparation, a 6 French  right femoral artery access, a 6 [place] multipurpose pigtail  catheter was  advanced to the ascending aorta over a 0.025 J-wire.  The catheter was then  advanced to the left ventricle.  Left ventricular pressures were monitored.  Hand contrast injection of the left ventricle was performed in both the LAO  and RAO projection.  The catheter was flushed with heparin and pulled back  into the ascending aorta.  Pullback pressures were monitored.   The right coronary artery was selectively engaged and angiography was  performed.  In a similar fashion, the left coronary artery was selectively  engaged.  Angiography was performed.  Then the catheter was pulled back into  the arch of the aorta and right innominate and left subclavian artery was  selectively cannulated and angiography was repeated.  Right femoral  angiography was performed through the arterial access sheath to evaluate the  axis and a 6 French right femoral venous access was also obtained,.  The  multipurpose pigtail catheter was returned to the body.   TECHNIQUE OF INTERVENTION:  A 6 French sheath was exchanged with a 7 [place]  sheath.  Then a 7 [place] FR4 guide was utilized to engage the right coronary  artery.  Intravenous heparin was administered, and the ACT was maintained at  therapeutic range.  The patient also received ReoPro.  Then the 190 cm x 0.014 inch ATW guidewire was utilized across to the right coronary  artery  and the wire easily crossed into the RCA.  Then a 2.0  x 20 mm Maverick  balloon was utilized to perform predilatation at low pressures of 6  atmospheres for about 30-40 seconds.  Then the balloon was pulled back into  the guiding catheter and9 mcg of intracoronary Adenosine was administered.  Angiography was repeated.  Excellent TIMI-3 flow was established.  However,  there was haziness and ulceration noted in the __________ stenotic region.  Then a 3.5 x 18 Cypher stent was utilized and easily crossed to the lesion  and after confirming the position of the stent, the stent was deployed at 16  atmospheres of pressure for about 50 seconds.  The balloon was deflated and  pulled back into the guiding catheter.  About 50 mcg of intracoronary  nitroglycerin was administered was angiography was repeated.  Excellent  results were noted.   After confirming the success of the results, the guide catheter was  disengaged from the right coronary artery and pulled out of the body in the  usual fashion.  The right femoral access was closed with AngioSeal with  excellent hemostasis obtained, and the patient was transferred to the  recovery area in stable condition. . The patient tolerated the procedure  well.                                               Cristy Hilts. Jacinto Halim, M.D.    Pilar Plate  D:  07/10/2003  T:  07/10/2003  Job:  [id number]   cc:   Nanetta Batty, M.D.  Fax: [phone]

## 2010-07-28 NOTE — Cardiovascular Report (Signed)
Jessica [name], Jessica [name]             ACCOUNT NO.:  [id number]   MEDICAL RECORD NO.:  [id number]          PATIENT TYPE:  OIB   LOCATION:  6523                         FACILITY:  [facility]   PHYSICIAN:  Nicki Guadalajara, M.D.     DATE OF BIRTH:  [birth year]   DATE OF PROCEDURE:  05/16/2005  DATE OF DISCHARGE:                              CARDIAC CATHETERIZATION   INDICATIONS:  Jessica [name] is a 50 year old African-American female  who has known coronary obstructive disease. She initially suffered a  myocardial infarction in 1996 and underwent angioplasty of the LAD in [facility]. In July 2003, she underwent stenting of a high marginal ramus  intermediate-like vessel. She is also status post stenting to her diagonal  vessel and LAD beyond the diagonal vessel. She is also status post prior  stenting to her mid-right coronary artery. In May 04, 2005, repeat  catheterization revealed progressive 95% stenosis in the RCA beyond the  stented segment and this was successfully with a 3.5 x 18 mm Cypher stent,  postdilated 3.75 mm. She was found to have fairly patent stents in all other  systems but had progressive 80-85% stenosis in the OM-2 vessel and  circumflex. Plans were made for staged intervention to her circumflex  vessel. The patient opted to go home and see how she did on medical therapy.  She was seen in the office two days ago and is now brought back to [facility] electively for planned stage intervention to her LCX OM-2  vessel.   PROCEDURE:  After premedication with Versed 2 milligrams initially and then  an additional 1 milligram Versed, the patient was prepped and draped in the  usual fashion. Her right femoral artery was punctured anteriorly and a 6-  French sheath was inserted. Initially, a right coronary catheter was  inserted 5-French to relook at the right coronary artery. This revealed  widely patent stents in the mid-RCA in tandem fashion. IC nitroglycerin  was  also administered to further evaluate the previously noted mild narrowing  proximal to the stented segment. Attention was then directed at the left  coronary system. A 6-French left four guide was used for the procedure. The  patient was given Angiomax for anticoagulation and had been on aspirin and  Plavix and received an additional 150 milligrams of oral Plavix. ACT was  documented to be therapeutic. An ATW wire was advanced down the circumflex  marginal to the vessel. Because of the appearance of the stenosis, it was  felt that predilatation with cutting balloon arthrotomy would be most  helpful. A 2.25 x 10 mm cutting balloon was then inserted and sequential  cuts were made up to a maximum of 6 atmospheres. A 2.5 x 13 mm drug-eluting  Cypher stent was then successfully deployed with careful attention to cover  the ostium and not to encroach upon the AV groove circumflex lumen. Two  inflations were made with dilatation up to 16 atmospheres. A 2.75 x 12 mm  Quantum balloon was then used for poststent dilatation. [facility] angiography  confirmed an excellent angiographic result. There was TIMI III  flow. There  was no evidence for dissection. The patient did receive numerous doses of IC  nitroglycerin during the procedure.   HEMODYNAMIC DATA:  Central aortic pressure was 139/86, mean 109.   ANGIOGRAPHIC DATA:  As noted above, the right coronary artery had two  previously placed stents in the midsegment. There was irregularity and  narrowing of 10-20% proximally. There also suggested perhaps maybe smooth  10% narrowing in the region of the crux. This 10% narrowing in the region of  the crux normalized following IC nitroglycerin administration.   The left main coronary artery was angiographically normal and bifurcated  into LAD and left circumflex system. There was no change in the previous  anatomy to the LAD system from the catheterization of several weeks ago with  widely patent  stents in the diagonal and LAD system. Circumflex vessel was a  moderate-sized vessel. There was a widely patent stent in the first marginal  which almost was like a ramus intermediate vessel. There was mild 20%  narrowing ostially prior to that high marginal stent. The second major  marginal vessel had somewhat eccentric 85% smooth stenosis that had a  potential elastic recoil look to it. The third marginal vessel with a  diminutive vessel that was less than 1.5 mm and had 80% narrowing in its  midsegment. The circumflex ended in a PLA vessel.   Following successful cutting balloon atherotomy and stenting of the OM-2  vessel, the 85% stenosis was reduced to 0% with post dilatation up to 2.75  mm. There was TIMI III flow. There was no evidence for dissection.   IMPRESSION:  1.  Successful cutting balloon arthrotomy/stenting and percutaneous      transluminal coronary angioplasty of the obtuse marginal-2 vessel of the      left circumflex coronary artery with 85% stenosis being reduced to 0%      done with Angiomax for anticoagulation in addition to aspirin and      Plavix.  2.  No restenosis in the recently placed right coronary artery stent without      restenosis of the prior placed right coronary artery stent and with      previously noted 10-20% proximal irregularity of the right coronary      artery.           ______________________________  Nicki Guadalajara, M.D.     TK/MEDQ  D:  05/16/2005  T:  05/16/2005  Job:  [id number]   cc:   Orville Govern

## 2010-07-28 NOTE — Discharge Summary (Signed)
NAMEMARGERT, EDSALL             ACCOUNT NO.:  [id number]   MEDICAL RECORD NO.:  [id number]          PATIENT TYPE:  INP   LOCATION:  6523                         FACILITY:  [facility]   PHYSICIAN:  Nicki Guadalajara, M.D.     DATE OF BIRTH:  09-08-1960   DATE OF ADMISSION:  05/16/2005  DATE OF DISCHARGE:  05/18/2005                                 DISCHARGE SUMMARY   DISCHARGE DIAGNOSES:  1.  Coronary artery disease with CYPHER stent and cutting balloon      angioplasty to the second obtuse marginal branch and circumflex. This      was a staged procedure.      1.  Prior right coronary artery stent currently patent. The second stent          to the RCA was placed May 04, 2005.      2.  History of angioplasty to the left anterior descending and stenting          to her diagonal vessel and LAD beyond the diagonal vessel.  2.  Hypertension.  3.  Hyperlipidemia.  4.  Diabetes mellitus, poorly controlled, secondary to noncompliance.  5.  Recent tobacco use, hopefully quit one week ago.  6.  Non-ST-elevation myocardial infarction secondary to distal embolization      during the procedure, most likely cough.   DISCHARGE CONDITION:  Stable. The patient was agitated at timing of  discharge.   DISCHARGE MEDICATIONS:  1.  Plavix 75 mg daily.  2.  Nitroglycerin sublingual p.r.n.  3.  Enteric-coated aspirin 81 mg daily.  4.  Glucophage 1000 mg daily.  5.  Toprol XL 75 mg daily, new dose.  6.  Crestor 20 mg daily.  7.  Avandia 4 mg daily.   DISCHARGE INSTRUCTIONS:  1.  No strenuous activity, increase activity slowly.  2.  Wash her right groin cath site with soap and water, call if any swelling      or drainage.  3.  Watch diet closely. Uncontrolled diabetes can lead to heart attacks,      death, kidney failure, blindness, and stroke.  4.  Follow up with Dr. Landry Dyke office, will call with the date and time.  5.  Follow up with Dr. Delanna Notice for diabetes management.   HISTORY OF PRESENT  ILLNESS:  A 50 year old African American female recently  hospitalized with unstable angina and with known coronary artery disease who  suffered an MI in 1996, undergoing angioplasty of her LAD in [place]. In  July 2003 she underwent stenting of the high obtuse marginal, ramus  intermedius-like vessel. She is also status post intervention to her LAD  after the diagonal as well as intervention to her diagonal vessel as well as  mid RCA. On May 04, 2005, she underwent cardiac catheterization where  she was found to have progressively 95% stenosis of the mid RCA after her  stent and CYPHER stent was inserted.   At that time she was noted to have a 20% to 30% proximal LAD stenosis,  mildly patent stent in the diagonal, LAD, and high marginal vessel. She also  had progressive focal 80% stenosis in the proximal portion of the second  marginal vessel and she was admitted for staged intervention to the  circumflex vessel at that time.   Apparently she was to have had it done during her hospitalization though she  had to leave the hospital the day after procedure and go home. Therefore,  this was rescheduled.   In the office, on the 5th, she had never filled her prescription for Toprol.   She presented to [facility] on May 16, 2005, for intervention.   Outpatient medications are same as inpatient except for Toprol dose has been  increased.   For family history, social history, and review of systems, see H&P.   PHYSICAL EXAMINATION AT DISCHARGE:  Blood pressure 120/70, pulse 82,  respirations 20, temperature 98, oxygen saturation 98% . Heart had a regular  rate and rhythm. Lungs are clear. Abdomen was soft and nontender. Positive  bowel sounds. Obese. Extremities without edema.   LABORATORY DATA:  Post procedure labs revealed hemoglobin of 13.9,  hematocrit 39.7, WBC 5.8, platelet count 332,000. At the time of discharge  hemoglobin 14, hematocrit 40.7, platelet 300,000. Chemistry  revealed sodium  of 138, potassium 4.2, chloride 103, CO2 25, glucose 244, BUN 10, creatinine  0.8, calcium 9.5. Potassium dropped to 3.5 and that was replaced prior to  discharge. The patient was on heparin and that was discontinued prior to her  discharge.   Cardiac enzymes with CKs ranging from 468 with 33 MBs, 321 with 17 MBs, and  212 with 9.3 MBs. Troponin-I was 4.82, 3.44, and 2.70, positive for MI.   Urine pregnancy screening was negative. Urine culture was done with multiple  bacterial morphotypes present, none predominate. The patient was  asymptomatic.   EKG post procedure was sinus rhythm, possible inferior infarct, age  undetermined. There were no changes in her EKG.   HOSPITAL COURSE:  Ms. Violet was admitted by Dr. Tresa Endo for elective  angioplasty, which she tolerated well. She had chest pain initially after  the procedure, but none once that was controlled. By the next morning her  cardiac enzymes had risen, she was kept an extra 24 hours on IV heparin and  no complaints at all. EKG without any significant changes. By May 18, 2005,  she was anxious to go home, did not want to stay any longer. She saw Dr.  Elsie Lincoln. She had not any chest pain, appeared to be quite stable, felt it was  stable to discharge. She was very anxious and determined to go home and  actually was rude to the nurses and was acting out somewhat in the room,  because it took 2.5 hours for someone to do her discharge. I am unsure why  she gets so anxious to be discharged from the hospital. This is the second  time she has commanded to go home quickly. Also when discussing her  diabetic status with a history of glycohemoglobin of 11, she drinks sweet  tea at [employer]. Again, instructed to stop sugar as it can cause many  issues and she is really harming herself. She states she will try and also tobacco cessation was recommended, which she states she quit a week ago. She  will follow up with Dr.  Tresa Endo.      Darcella Gasman. Annie Paras, N.P.    ______________________________  Nicki Guadalajara, M.D.    LRI/MEDQ  D:  05/18/2005  T:  05/20/2005  Job:  [id number]   cc:   Demetrios Isaacs.  H[place]we[place], M.D.  Fax: [phone]

## 2010-07-28 NOTE — Discharge Summary (Signed)
[place]  Patient:    Jessica [name], Jessica [name] Visit Number: [id number] MRN: [id number]          Service Type: CAT Location: 6500 [id number] Attending Physician:  Berry, Jonathan [place] Dictated by:   Raymon Mutton, P.A. Admit Date:  09/25/2001 Discharge Date: 09/26/2001   CC:         [facility]   Discharge Summary  DATE OF BIRTH:  04/11/1960.  DISCHARGE DIAGNOSES: 1. Coronary artery disease, status post percutaneous coronary intervention    during this admission by Runell Gess, M.D. 2. Hypercholesterolemia. 3. Tobacco abuse. 4. History of nonsustained ventricular fibrillation. 5. Hypertension.  HISTORY OF PRESENT ILLNESS:  The patient is a 50 year old African American female hospital presented to the office on September 17, 2001, and was seen by Dr. Allyson Sabal.  At that appointment the patient continued to complain of chest discomfort and despite her Cardiolite a couple of months prior to the office visit, was normal.  Given her prior cardiac history of her stents and MI, the decision was made to bring her to the hospital for the elective coronary angiography.  She presented to the short stay unit at [facility] on September 25, 2001, and was prepared for the procedure and take to the cardiac catheterization.  HOSPITAL COURSE:  Coronary angiography performed by Dr. Allyson Sabal.  The patient underwent PTCA and stenting with Cypher stent of the obtuse marginal artery with reduction of the stenotic lesion from 70% to 0.  The procedure was carried out with no complications and she was given a load of Integrelin bolus and drip for 18 hours and was deemed for discharge home the next morning.  The next morning, the patient was found to be in stable condition with blood pressure systolic 109, diastolic 55, pulse 79.  She was free of chest pain and shortness of breath.  Her groin puncture site was stable with no  signs of bleeding or ecchymosis.  The patient was discharged home with plan to follow up with Dr. Allyson Sabal in two to three weeks.  LABORATORY STUDIES:  CBC post catheterization showed hemoglobin 12.5, hematocrit 37.  BMP revealed sodium 138, potassium 3.6, chloride 106, CO2 24, glucose 127, BUN 7, and creatinine 0.8.  DISCHARGE MEDICATIONS: 1. Plavix 75 mg p.o. q.d. for three months. 2. Toprol XL 50 mg p.o. q.d. 3. Zocor 20 mg p.o. q.d. 4. Aspirin 81 mg p.o. q.d.  FOLLOW-UP:  Appointment was scheduled with Dr. Allyson Sabal on October 24, 2001, at 11:30.  DISCHARGE INSTRUCTIONS:  She was instructed not to drive for three days post catheterization, avoid strenuous physical activity, and heavy lifting for 72 hours and also report any complications of the groin puncture site such as bleeding, swelling, redness, increased pain, or oozing to our office and phone number was provided. Dictated by:   Raymon Mutton, P.A. Attending Physician:  Berry, Jonathan [place] DD:  09/26/01 TD:  10/01/01 Job: [id number]

## 2010-07-28 NOTE — Cardiovascular Report (Signed)
[facility]  Patient:    Jessica [name], Jessica [name] Visit Number: [id number] MRN: [id number]          Service Type: CAT Location: 6500 [id number] Attending Physician:  Berry, Jonathan [place] Dictated by:   Runell Gess, M.D. Proc. Date: 09/25/01 Admit Date:  09/25/2001 Discharge Date: 09/26/2001   CC:         Cardiac Catheterization Laboratory  [facility], [address], [place], [place] [address]                        Cardiac Catheterization  INDICATIONS: The patient is a 50 year old, moderately overweight black female, who suffered anterior wall myocardial infarction in 1996 treated with PTCA. Her risks factors include family history, hyperlipidemia, and continued tobacco abuse. She was catheterized at [facility] on July 11 revealing a moderate proximal high obtuse marginal branch stenosis. She has ______ Cardiolite but ongoing chest pain. She presents now for IVUS guided PCI.  DESCRIPTION OF PROCEDURE: The patient was brought to the second floor [facility] in the postabsorptive state. She was premedication with p.o. Valium, IV Versed and Nubain. She is on aspirin and Plavix. Her right groin was prepped and shaved in the usual sterile fashion. Xylocaine 1% was used for local anesthesia.  A 7 French sheath was inserted into the right femoral artery using standard Seldinger technique. A 7 [place] guide catheter along with a 0.014 190 sport guide wire and a 3.2 [place] Galaxy IVUS catheter were used for intravascular ultrasound interrogation. The patient received 4500 units of heparin during the case with an ending ACT of 194. She received an additional 1500 units at the end of the case. She received Plavix 150 p.o. as well as Integrilin double bolus and infusion. She was hemodynamically stable throughout the case. The guide wire was manipulated across the circumflex OM  proximal lesion into the mid to distal vessel. The IVUS catheter was then passed across the lesion with interrogation of the distal reference segment measuring 2.8 x 3.0 mm. The culprit lesion revealed occlusive plaque. Thus, PCI will be performed.  Using a 2.0 x 15 Maverick pre-dilatation was performed. The patient received 200 mcg of intracoronary nitroglycerin. A 2.5 x 18 Cypher stent was then deployed beginning at the ostium of the vessel covering the lesion into a more normal portion distally at 14 atmospheres resulting in a post-dilatation diameter of about 2.6 to 2.7 mm. This resulted in an excellent angiographic result. There was approximately 1 mm of uncovered ostium. However, there was not a large plaque burden at this location by IVUS.  OVERALL IMPRESSION: Successful intravascular ultrasound-guided high circumflex marginal branch, percutaneous coronary intervention and stenting using drug-eluting stent and adjunctive glycoprotein IIb/IIIa inhibition. The patient tolerated the procedure well. The guide wire and catheter were removed. The sheaths were sewn securely in place and the patient left the lab in stable condition.  Plans will be to remove the sheath once the ACT falls below 150. Integrilin will be continued for 18 hours. The patient will be discharged home in the morning if she remains clinically stable overnight. She left the lab in stable condition. Dictated by:   Runell Gess, M.D. Attending Physician:  Berry, Jonathan Swazil[place] DD:  09/25/01 TD:  09/30/01 Job: [id number]

## 2010-07-28 NOTE — Discharge Summary (Signed)
[name], [name]             ACCOUNT NO.:  [id number]   MEDICAL RECORD NO.:  [id number]          PATIENT TYPE:  INP   LOCATION:  6531                         FACILITY:  [facility]   PHYSICIAN:  Cristy Hilts. Jacinto Halim, MD       DATE OF BIRTH:  29-Jul-1960   DATE OF ADMISSION:  05/02/2005  DATE OF DISCHARGE:  05/05/2005                                 DISCHARGE SUMMARY   [name] came in complaining about chest pain for a week,  thought to be gas. She had a stomach virus. She had some shortness of breath  with the chest pain. She took a nitroglycerin. She had relief after a  minute. She had a prior medical history of heart disease. She came to the  emergency room and she was admitted for rule out MI and for further workup  and probable cardiac catheterization. She does have a prior medical history  of non-ST-elevation MI., and she has a history of diagonal-1 stent, LAD  stent, OM-1 stent, and an RCA stent. It was decided that she should be  admitted to rule out MI. She was put on IV heparin. CK-MBs and troponins  came back  negative. She underwent cardiac catheterization on May 04, 2005, revealing patent stents to rule out a diagonal, LAD, OM, and her RCA,  but she had a new blockage of 95% after her RCA stent. Thus, she underwent  CYPHER stenting, 2.5 x 18, to the RCA. The following morning, May 05, 2005, she was seen by Dr. Jacinto Halim. She was considered stable for discharge  home. Her medications were titrated. She has a problem with compliance with  her medications. Thus, it was reiterated to her at the time of discharge  that it was very important for her to stay on her medications and especially  never to stop her Plavix since she has a CYPHER stent. Dr. Jacinto Halim spoke her  to about diet, smoking cessation.   DISCHARGE MEDICATIONS:  1.  Aspirin 325 mg once a day.  2.  Plavix 75 mg once per day.  3.  Avandia 4 mg daily.  4.  Crestor 20 mg at bedtime.  5.  Glucophage 1000  mg b.i.d.  6.  Tricor 145 mg one time per day.  7.  Toprol XL 25 mg one time per day.  8.  Nitroglycerin she can use sublingual when needed for chest pain.   She is to do no strenuous activity, lifting, pushing, pulling, or extended  walking for four days. She will follow up with Dr. Tresa Endo in two weeks.   LABORATORY:  Hemoglobin 15.1, hematocrit 44.5, WBC 6.1, platelet count  231,000. Potassium at the time of discharge was 3.7, glucose 211, BUN 6,  creatinine 0.6, total cholesterol 207, triglycerides 155, HDL 32, and LDL  142.  Hemoglobin A1c was 11.8.  Magnesium was 1.7.  CKs and troponins were  negative.   DISCHARGE DIAGNOSES:  1.  Unstable angina.  2.  Diabetes mellitus, type 2, uncontrolled.  3.  Dyslipidemia.  4.  Coronary artery disease, progressive, now status post catheterization  showing progressive disease in her right      coronary artery, other stents patent.  5.  Obesity.  6.  Medication noncompliance.      Lezlie Octave, N.P.      Cristy Hilts. Jacinto Halim, MD  Electronically Signed    BB/MEDQ  D:  05/05/2005  T:  05/07/2005  Job:  [id number]   cc:   Olene Craven, M.D.  Fax: [phone]

## 2010-07-28 NOTE — Discharge Summary (Signed)
Jessica [name], Jessica [name]             ACCOUNT NO.:  [id number]   MEDICAL RECORD NO.:  [id number]          PATIENT TYPE:  INP   LOCATION:  2006                         FACILITY:  [facility]   PHYSICIAN:  Darlin Priestly, MD  DATE OF BIRTH:  03-Jun-1960   DATE OF ADMISSION:  01/09/2006  DATE OF DISCHARGE:  01/12/2006                                 DISCHARGE SUMMARY   DISCHARGE DIAGNOSIS:  1. Subendocardial myocardial infarction this admission treated with      percutaneous coronary intervention to in-stent restenosis to an      optional diagonal stent.  2. Known coronary disease with previous interventions in the past.  3. Non-insulin-dependent diabetes was suboptimal control.  4. History of hypertension, somewhat hypotensive at discharge.  5. Dyslipidemia.  6. History of smoking.   HOSPITAL COURSE:  The patient is a 50 year old female with a history of  coronary disease.  She has had several interventions in the past.  Her last  intervention was February 2007 where she underwent RCA Cypher stenting and  OM-2 stenting and circumflex stenting.  She apparently had run out of her  medicines a couple weeks ago.  She presented on January 09, 2006 with chest  pain.  She was admitted to telemetry, seen by Dr. Clarene Duke.  She was set up  for diagnostic catheterization which was done on January 09, 2006.  This  revealed 30-40% narrowing in the LAD stent, 80% optional diagonal in-stent  restenosis, 60% in-stent restenosis to the first OM with left-to-right  collaterals and a total proximal RCA which was previously patent with left-  to-right collaterals.  The patient underwent intervention to the optional  diagonal.  Attempt was made to open the RCA but this was unsuccessful.  Dr.  Clarene Duke feels the patient should have an outpatient Cardiolite in 6-8 weeks  to assess her ischemic burden.  It is possible she may need further  treatment including a bypass surgery.  Dr. Shana Chute was asked to see her  during this admission to help [place] with diabetes control.  We feel the patient  can be discharged January 12, 2006.  We did hold her Diovan at discharge as  she was somewhat hypotensive.  We did not do an LV gram at the time of her  catheterization.  Her EF by Cardiolite study which interesting was negative  in July 2007 was 55%.   DISCHARGE MEDICATIONS:  1. Plavix 75 mg a day.  2. Coated aspirin 325 mg a day.  3. Actos 30 mg a day.  4. Crestor 20 mg a day.  5. Zetia 10 mg a day.  6. Coreg 6.25 mg twice a day.  7. Protonix 40 mg a day.  8. Nitroglycerin sublingual p.r.n.   LABORATORIES:  White count 6.9, hemoglobin 13.5, hematocrit 38.6, platelets  291.  Sodium 137, potassium 3.9, BUN 7, creatinine 0.7.  CKs peaked at 351  with 36 MBs.  TSH is 1.52.  Hemoglobin A1c is 10.4.  Chest x-ray showed  increased basilar interstitial prominence suspicious for edema.  INR is 0.9.  EKG reveals sinus rhythm and PVCs.  DISPOSITION:  The patient is discharged in stable condition.  She will  follow up with Dr. Tresa Endo.  She needs a Cardiolite study in 6-8 weeks.  She  will also follow up with Dr. Shana Chute and he has arranged that.      Abelino Derrick, P.A.      Darlin Priestly, MD  Electronically Signed    LKK/MEDQ  D:  01/12/2006  T:  01/13/2006  Job:  [id number]   cc:   Nicki Guadalajara, M.D.  Olene Craven, M.D.  Osvaldo Shipper. Spruill, M.D.

## 2010-07-28 NOTE — Discharge Summary (Signed)
Jessica [name], Jessica [name]                         ACCOUNT NO.:  [id number]   MEDICAL RECORD NO.:  [id number]                   PATIENT TYPE:  INP   LOCATION:  2035                                 FACILITY:  [facility]   PHYSICIAN:  Cristy Hilts. Jacinto Halim, M.D.                  DATE OF BIRTH:  1960-06-09   DATE OF ADMISSION:  07/10/2003  DATE OF DISCHARGE:  07/13/2003                                 DISCHARGE SUMMARY   ADMISSION DIAGNOSES:  1. Unstable angina.  2. Hypertension.  3. Mixed hyperlipidemia.  4. Ongoing tobacco use.   DISCHARGE DIAGNOSES:  1. Acute myocardial infarction.  2. Hypertension.  3. Mixed hyperlipidemia.  4. Ongoing tobacco use.  5. Status post emergency cardiac catheterization, on July 10, 2003, by Dr.     Cristy Hilts. Ganji.  He performed percutaneous transluminal coronary     angioplasty stent to the mid right coronary artery.  See dictated report     for other details.  6. Normal left ventricular function, ejection fraction 55% at cardiac     catheterization, July 10, 2003.  7. Residual coronary artery disease in the left anterior descending coronary     artery, just prior to the previously placed stent.  8. Diabetes mellitus type 2, newly diagnosed.  Hemoglobin A1c 7.6.     a. Status post consultation by Dr. Olene Craven, regarding diabetic        management on Jul 12, 2003.  Plans for outpatient follow up with him.  9. Smoking.  Cessation discussed through the hospitalization.   HISTORY OF PRESENT ILLNESS:  Jessica [name] is a 50 year old African American  female who was transferred from [facility] over to [facility]  on the morning of July 10, 2003, secondary to chest pain.  Jessica [name] stated that,  on July 09, 2003, at 1:30 p.m., Jessica [name] had chest pain that kind of came and  went.  When Jessica [name] came home at about 5:30 p.m., the pain was gone.  Jessica [name]  showered and rested and then went out to eat.  At that time, Jessica [name] had  recurrent chest pain at about 8 p.m.  Jessica [name] used  nitroglycerin with no relief.  Jessica [name] used a second nitroglycerin, ended up taking a total of four  nitroglycerin; however, these were old nitroglycerin.  Jessica [name] later went to  bed, and Jessica [name] was awoken at about 12 midnight with recurrent chest pain that  continued constantly.  Jessica [name] then went to the ER at [facility] at  about 3 a.m.  On exam, at that time, her heart rate was 76, blood pressure  120/74.  EKG showed ST elevation in leads II, III, and aVF and ST  depressions in leads I and aVL.  No significant abnormalities on physical  exam.   At this point, Jessica [name] was seen and evaluated by Dr. Cristy Hilts. Ganji.  It was felt  that  Jessica [name] had experienced a non-ST inferior MI.  Most noted that her peak  pain had been at about 3:30 a.m.  Repeat EKG showed resolution of ST  elevation, but this patient was still having ongoing chest pain.  At that  point, Dr. Jacinto Halim planned to take her to the cath lab emergently.  He  explained 1% risk of death, stroke, MI, need for urgent CABG with diagnostic  cath and 2% to 3% of the above risk for a PCI necessary.  The patient  understood and was agreeable to proceed.   HOSPITAL COURSE:  On July 10, 2003, Jessica [name] underwent urgent cardiac  catheterization by Dr. Cristy Hilts. Ganji.  Please see the dictated report for  details.  Jessica [name] was found to have occlusion of a mid RCA.  He performed PTCA  stent to the area with good results.  He used concomitant IV heparin  __________ .  Jessica [name] tolerated the procedure well without complications.  See  his dictated report for findings.  Note, that the previously placed stents  to the LAD and diagonal were patent; however, just proximal to the stent  site did have about 60-70% stenosis; however, this was at the bifurcation of  the LAD and diagonal.  He planned to get a followup Cardiolite scan to  assess for ischemia in this region.   On Jul 11, 2003, Jessica [name] was without chest pain.  Jessica [name] was stable and  doing well.  Enzymes were  still elevated at that time.  It was planned to  check followup set.  It was noted that her blood sugar was elevated.  We  obtained a hemoglobin A1c which was elevated at 7.6.  We plan to get  evaluation regarding diabetes from Dr. Barbee Shropshire.   On Jul 12, 2003, Jessica [name] remained stable without complaint.  Jessica [name] was doing well.  We adjusted medicines and called Dr. Barbee Shropshire for evaluation.  On Jul 12, 2003, Jessica [name] was seen in consultation by Dr. Kern Reap regarding diabetes  management.  He planned to initiate Glucophage once Jessica [name] was 48 hours out  post cath.  As well, he would followup regarding Glucometer unit and  instructions, etcetera.  He would see her in the office for followup in a  couple of weeks post hospitalization.   On Jul 13, 2003, Jessica [name] was doing well without complaint.  Jessica [name] was without chest  pain or shortness of breath.  Jessica [name] was afebrile at 97.4, pulse of 78, blood  pressure 110/100, 98.7% on room air oxygen sat.  Her lungs were clear.  There was no lower extremity edema.  Her groin site was well healed.  At  this point, Jessica [name] was seen and evaluated by Dr. Madaline Savage who deemed  her stable for discharge home.  Also note, that Jessica [name] did undergo smoking  cessation consult, prior to discharge home as well as Jessica [name] did undergo  cardiac rehab consultation.   HOSPITAL PROCEDURES:  Urgent cardiac catheterization performed on July 10, 2003, by Dr. Yates Decamp.  Jessica [name] was found to have an occluded RCA which he  performed PTCA stent.  Note that Jessica [name] did have patent stent to the LAD and  diagonal; however, just proximal to these stents was a 60-70% stenosis which  was at the bifurcation.  He plans to get an outpatient Cardiolite to  followup to assess for any ischemia from this area.  Please see his dictated  report for other findings.  Jessica [name] tolerated the procedure well  without  complications.   HOSPITAL CONSULTS:  1. Consult with Dr. Kern Reap, on Jul 12, 2003, for evaluation of     diabetes, type 2, which is a new diagnosis.  2. As well, Jessica [name] did have smoking cessation consult.  3. Cardiac rehab evaluation.   LABS:  Thyroid functions are normal including TSH, T3, T4.  Lipid profile  shows total cholesterol 166, triglycerides 155, HDL 31, LDL 94.  Cardiac  enzymes showed CKs of 170, 679, 808, 520.  CK-MB of 9.5, 103.1, 111.3, 34.9.  Troponin of 0.27, 13.64, 16.01.  Hemoglobin A1c of 7.6.  Sodium 137,  potassium 3.6, glucose 159, BUN 5, creatinine 0.8.  Liver function tests are  normal other than just an AST of 56 but all others are normal.  White count  is 5.9, hemoglobin 11.9, hematocrit 34.8, platelets 308.   Chest x-ray shows stable chest x-ray.  Heart upper normal.   EKG:  I do not see a copy of initial EKG.  However, Dr. Verl Dicker  interpretation in the H&P showed normal sinus rhythm, ST elevation in leads  II, III, aVF.  ST depression in leads I and aVL.  Followup EKGs which are  available were performed on April 30th, showed no ST-T abnormalities.   DISCHARGE MEDICATIONS:  1. Glucophage 500 mg twice a day.  2. Toprol XL 50 mg once a day.  3. Niaspan 500 mg one at night.  4. Plavix 75 mg once a day.  5. Aspirin 81 mg a day.  6. Altace 10 mg at night.  7. Nitroglycerin as directed.  8. Zocor 40 mg at night.  9. Isosorbide.   No strenuous activity until you see Dr. Tresa Endo.   Low-salt, low-fat, diabetic, low-cholesterol diet.   Call [phone] if any bleeding __________ around the groin site.   Call Dr. Barbee Shropshire at [phone] to make an appointment to see him in the next  10-14 days for diabetic management.  Follow with Dr. Tresa Endo, August 11, 2003 at 2:30 p.m.      Mary B. Remer Macho, P.A.-C.                   Cristy Hilts. Jacinto Halim, M.D.    MBE/MEDQ  D:  08/12/2003  T:  08/13/2003  Job:  [id number]   cc:   Nicki Guadalajara, M.D.  [phone] [address], [address]  [place], [place] [address]  Fax: [phone]   Olene Craven, M.D.  [address]  [address]  [place]  [place]  [address]  Fax: [phone]   Cristy Hilts. Jacinto Halim, M.D.  [address]  [place]  [place] [address]  Fax: [phone]

## 2010-07-28 NOTE — Cardiovascular Report (Signed)
[name], [name] NO.:  [id number]   MEDICAL RECORD NO.:  [id number]          PATIENT TYPE:  INP   LOCATION:  6531                         FACILITY:  [facility]   PHYSICIAN:  Nicki Guadalajara, M.D.     DATE OF BIRTH:  12/15/1960   DATE OF PROCEDURE:  05/04/2005  DATE OF DISCHARGE:                              CARDIAC CATHETERIZATION   INDICATIONS:  Mr. Jessica [name] is a 50 year old African-American female  with known coronary artery disease status post multiple prior interventions.  She initially suffered a myocardial infarction in 1996 and underwent  angioplasty over the LAD in [facility]. In July 2003, she underwent stenting  of a high obtuse marginal ramus intermediate-like vessel. She is also status  post intervention to her LAD after the diagonal vessel, the diagonal vessel,  also midright coronary artery. Her catheterization had been in 2005. She had  been doing fairly well but for the last several weeks has developed  recurrent increasing episodes of chest pain leading to her [facility]  hospitalization. She is now referred for cardiac catheterization and  possible intervention.   PROCEDURE:  After premedication with Versed 2 milligrams intravenously, the  patient prepped and draped in the usual fashion. The right femoral artery  was punctured anteriorly and a 5-French arterial sheath was inserted.  Diagnostic catheterization was done with 5-French Judkins #4 left and right  coronary catheters. A pigtail catheter was used for biplane cine left  ventriculography as well as distal aortography. With the demonstration of  high-grade 95+ percent diffuse stenosis in the mid-LAD beyond the previously  stented segment, the decision was made to attempt intervention to this  tightest lesion today in the setting of her diagnostic catheterization and  probably plan stage intervention to her more mature circumflex marginal  vessel. This was discussed in detail with the  patient. The arterial sheath  was upgraded to a 6-French system. The patient received an additional 1  milligram Versed. Double bolus Integrilin weight-adjusted 4300 units of  heparin was administered. The patient had been on Plavix but received an  additional 300 milligram load in the laboratory. ACT was documented  therapeutic. A 6-French FR-4 guide was used for the intervention. An ATW  wire was advanced down the right coronary artery. Due to the tight lesion,  predilatation was done utilizing a 2.0 x 15 mm Maverick balloon. A 3.5 x 18  mm drug-eluting Cypher stent was then successfully inserted and dilated with  this stent being placed in tandem fashion to the previously placed midright  coronary artery stent. This was dilated x2 up to 16 atmospheres. A 3.75 x 15  mm Quantum balloon was then used for poststent dilatation up to 3.75 mm.  Scout angiography confirmed an excellent angiographic results. There was  TIMI III flow. There was no evidence for dissection.   HEMODYNAMIC DATA:  Central aortic pressure is 130/81, mean 104. Left  ventricular pressure 130/20.   ANGIOGRAPHIC DATA:  Left main coronary artery was angiographically normal  and bifurcated into an LAD and left circumflex system.   The LAD had mild 20-30% areas of  narrowing proximal to the takeoff of the  first septal perforating artery. The diagonal vessel had a widely patent  stent originating from its ostium and extending into the midportion of the  diagonal vessel. The stent in the LAD, after the diagonal, was widely  patent. The remainder of the LAD was free of significant disease and wrapped  around the LV apex.   The circumflex vessel gave rise to a high marginal/almost ramus intermediate  like vessel. There was a widely patent stent in the proximal segment of this  high marginal vessel. There was now progressive stenoses with 80% eccentric  stenosis being present in the ostium proximal portion of a more distal   second marginal vessel. The circumflex gave rise to an additional third  marginal and in the posterolateral like vessel.   The right coronary artery was a large-caliber vessel that had smooth 20%  narrowing proximal to the previously placed mid-RCA stent. The previously  placed Cypher stent in the midright coronary was widely patent. Beyond this  segment, there was diffuse narrowing of 95% involving the takeoff of an  acute marginal branch. The rest of the RCA was free of significant disease.   Following successful PTCA and stenting of this 95% stenosis in the right  coronary artery, utilizing a 3.5 x 18 mm Cypher stent postdilated 3.75 mm,  the 95% stenosis was reduced to 0%. There was TIMI III flow. There was no  evidence for dissection.   Biplane cine left ventriculography revealed low normal global LV function  with an ejection fraction of approximately 50-55%. There was evidence for  mild middistal inferior hypocontractility and suggestion of low  posterolateral hypocontractility on the LAO projection.   Distal aortography did not demonstrate any renal artery stenosis. There was  no significant aortoiliac disease.   IMPRESSION:  1.  Low normal left ventricular function with mild middistal inferior as      well as low posterolateral hypocontractility.  2.  Mild irregularity of the proximal left anterior descending artery of 20-      30% prior to the first septal perforating artery with evidence for a      widely patent stent in the first diagonal vessel and a widely patent      stent to the left anterior descending artery after the diagonal vessel.  3.  Widely patent stent in the first marginal/ramus intermediate like branch      of moderate size circumflex system but evidence for progressive      eccentric 80% focal stenosis in the ostial proximal portion of the more      distal obtuse marginal 2 vessel. 4.  Smooth irregularity of 20% in the proximal right coronary artery with  a      widely patent previously placed 3.5 x 18 mm Cypher stent in the mid      right coronary artery but with evidence for new progressive diffuse 95%      stenosis above and beyond the acute marginal branch just distal to the      previously placed stent.  5.  Successful percutaneous transluminal coronary angioplasty/stenting of      the right coronary artery 95% stenosis with insertion of a 3.5 x 18 mm      Cypher stent postdilated 3.75 mm done with double bolus      Integrilin/weight-adjusted heparinization. The patient will be hydrated      following the catheterization, particularly with her diabetes mellitus.      Glucophage  will be held. States percutaneous coronary intervention of      the circumflex marginal vessel will be scheduled for be May 07, 2005.           ______________________________  Nicki Guadalajara, M.D.     TK/MEDQ  D:  05/04/2005  T:  05/05/2005  Job:  [id number]

## 2010-07-28 NOTE — Telephone Encounter (Signed)
Spoke w/pt her wt is up about 8 lbs, she is very short of breathe, unable to lay down, edema in LE, she states she normally takes Lasix 40 mg daily but has been taking 80 mg and this AM she took 160 mg and a zaroxolyn but it hasn't gotten any better, she is going to go to ER, Rosann Auerbach is aware

## 2010-07-28 NOTE — Cardiovascular Report (Signed)
Jessica [name], Jessica [name]                         ACCOUNT NO.:  [id number]   MEDICAL RECORD NO.:  [id number]                   PATIENT TYPE:  OUT   LOCATION:  CATH                                 FACILITY:  [facility]   PHYSICIAN:  Nicki Guadalajara, M.D.                  DATE OF BIRTH:  03/08/1961   DATE OF PROCEDURE:  11/25/2002  DATE OF DISCHARGE:                              CARDIAC CATHETERIZATION   PROCEDURES PERFORMED:  1. Cardiac catheterization.  2. Complex percutaneous coronary intervention with percutaneous transluminal     coronary angioplasty/cutting balloon atherotomy/stenting of the ostium of     the first diagonal and left anterior descending artery.   CARDIOLOGIST:  Nicki Guadalajara, M.D.   INDICATIONS:  Jessica [name] is a 50 year old African-American female  who apparently suffered an anterior myocardial infarction and underwent  angioplasty of her LAD in 1996 in [place].  In July 2003 she underwent  stenting of a high obtuse marginal ramus intermediate-like vessel by Dr.  Allyson Sabal.  She has a history of obesity, hypertension, tobacco use as well as  hyperlipidemia.  She was admitted on September 13th with chest pain.  Symptom complex was suggestive of unstable angina.  She is now referred for  definitive diagnostic catheterization.   DESCRIPTION OF PROCEDURE:  After premedication with Versed 2 mg  intravenously the patient was prepped and draped in the usual fashion.  The  right femoral artery was punctured anteriorly and a 6 French sheath was  inserted.  Diagnostic catheterization was done with 6 French Judkins 4 right  and left coronary catheters.  The right catheter was also used for selective  angiography into the left subclavian internal mammary artery system in the  event the patient ultimately required CBG revascularization surgery.  Six  French pigtail catheter was used for biplane cine left ventriculography as  well as distal aortography.  With the demonstration  of high-grade stenoses  involving both the ostium proximal portion of a large first diagonal vessel  of the LAD as well as the LAD beyond the diagonal takeoff the decision was  made to attempt coronary intervention.   The sheath was upgraded to a 7 [place] system.  Double bolus Integrelin and  weight-adjusted heparinization were administered; initially 4,500 units.  The patient also received an additional 150 mg of Plavix in addition to the  previous Plavix that she had been administered prior to the procedure.  An  Fl-4 was used for the guiding catheter.  The patient received an additional  thousand units of heparin after ACT verification.  Because of bifurcation-  like stenosis a double wire procedure was utilized with a Luge wire being  advanced down the LAD proper to the apex and also a second Luge wire being  advanced down the diagonal vessel.  It was felt that the high-grade diagonal  was very tight and ultimately would be best treated with cutting balloon  atherotomy.  However, it was felt that this was so tight as to probably not  be able to initially treated as such.  Consequently, a 2.0 x 15 mm Maverick  balloon was initially inserted and dilatation of the diagonal ostium was  performed.  This balloon was then removed and a 3.0 x 13 mm cutting balloon  atherotomy catheter was then successfully delivered to this region.  Segmental cuts were made up to five atmospheres.  This resulted in  significant improvement with the diagonal vessel now apparently being fairly  large. However, there was still a questionable area of haziness.  Initial  dilatation was done at this ostial site.  The cutting balloon was then used  and advanced down the wire to the diagonal vessel.  Dilatation was done up  to six atmospheres in this segment and extending almost just distal to the  diagonal takeoff.  A 3.5 x 18 mm drug eluding CYPHER stent was then inserted  over the LAD wire and was successfully  deployed with careful attention to  place the stent just after the diagonal takeoff so as not to potentially  jail the ostium of the diagonal.  This was dilated up to 14 atmospheres.  Multiple additional views were also made.  Again, there appear to be mild  haziness at the ostium of the diagonal.  For this reason the cutting balloon  was then reinserted and initial dilatation was made up to six atmospheres  for 1 minute 20 seconds.  During the procedure the patient also received  numerous doses of intracoronary nitroglycerin as well as being started on an  IV nitroglycerin drip; after the last cutting balloon if there still  appeared to be mild haziness at the ostium.  It was therefore elected to  stent this segment with careful attention to make certain there was no stent  into the LAD proper.  A 3.0 x 13 mm drug eluding CYPHER stent was then  successfully deployed and dilated to 15 atmospheres.  A 3.5 x 12 mm Quantum  balloon was then inserted over the diagonal wire into the diagonal stent and  dilatation was done up to 3.2 mm within this diagonal stented segment.  The  Quantum balloon was then removed from this wire and advanced down the LAD  within the previously stented mid LAD segment.  This was dilated up to 12  atmospheres corresponding to a 3.5 mm balloon.   Scout angiography confirmed very good angiographic result at both site.  Additional heparin was administered per ACT verification for a total dose of  7,500 during the procedure.  The patient also received an additional 150 mg  of Plavix when it was determined that the patient had only previously  received 75 mg yesterday and the day before, so that the patient received in  total 300 mg of oral Plavix in the laboratory.  Scout angiography confirmed  an excellent angiographic result.   The arterial sheath was sutured in place with plans for sheath removal later  today.  HEMODYNAMIC DATA:  Central aortic pressure is 142/78,  mean 107.  Left  ventricular pressure 142/29, post A wave 27.   ANGIOGRAPHIC DATA:  The left main coronary artery was angiographically  normal and bifurcated into an LAD and left circumflex system.   The LAD gave rise to a proximal small septal and then a large first diagonal  vessel.  There was 90% ostial proximal stenosis of this large first diagonal  vessel.  The  LAD just after the diagonal was narrowed to approximately 50-  60% and then after a second septal narrowed to 85-90%.  The remainder of the  LAD had minimal luminal irregularities, but was free of significant  obstructive disease and extended and wrapped around the LV apex.   The circumflex vessel gave rise to a very high marginal almost ramus  intermediate-like vessel as well as a second marginal branch, which was  moderate size, and a very small diminutive third marginal branch prior to  ending in a small posterolateral vessel.  There was no significant  restenosis in the stented high marginal vessel; however, there was mild 30%  narrowing just beyond the stented segment.  The third marginal was very  small and diminutive, and had 90% narrowing.   The right coronary artery supplied the PDA, but did not supply the  posterolateral system.  There was 30% proximal narrowing followed by 40-50%  mid narrowing in the region of an anterior RV marginal branch.   The left subclavian artery had a 50% eccentric stenosis proximal to the LIMA  takeoff seen on several views.  The LIMA itself was widely patent in the event the patient needed CBG  surgery.   Biplane cine left ventriculography revealed preserved global contractility;  however, there was focal upper and mid anterolateral hypocontractility on  the RAO projection with distal inferior to inferoapical hypocontractility.  On the LAO projection there was a very focal apical lateral  hypocontractility.  Ejection fraction was at least 55-60% (calculation in  progress).    Distal aortography did not demonstrate any significant renal artery  stenosis.  There was no significant aortoiliac disease.   Following complex intervention with PTCA/cutting balloon atherotomy and  stenting of the ostial proximal diagonal-1 vessel as well as the LAD just  beyond the diagonal into the mid segment the 90% diagonal stenosis was  reduced to 0% and the 60-85% LAD stenoses were reduced to 0%.  There was  TIMI III flow at the completion of the procedure.   IMPRESSION:  1. Normal global left ventricular function with focal distal inferior as     well as upper-to-mid anterolateral hypocontractility and focal apical     lateral hypocontractility.  2. Multivessel coronary artery disease with ostial 90% first diagonal     stenosis followed by 60 and 85% proximal-to-mid left anterior descending     stenoses; no significant restenosis in the high marginal obtuse marginal-    1 branch of the circumflex coronary artery with mild 30% narrowing beyond     the stented segment, and evidence for  90% stenosis in a small diminutive     obtuse marginal-3 branch of the circumflex vessel.  Thirty percent     proximal and 40-50% mid right coronary artery stenosis.  Fifty percent     eccentric left subclavian stenosis.  3. No significant aortoiliac disease.  4. Successful complex percutaneous transluminal coronary angioplasty/cutting     balloon atherotomy/stenting of both the ostial and proximal first     diagonal branch of the left anterior descending with a 90% stenosis being     reduced to 0% and 60-85% proximal-to-mid left anterior descending     stenoses being reduced to 0% utilizing a 3.0 x 13 mm drug eluding CYPHER     stent post dilated to 3.2 mm size in the diagonal vessel and a 3.5 x 18     mm drug eluding CYPHER stent post dilated to 3.5 mm is the left  anterior     descending system (done with double bolus Integrelin/weight adjusted     heparinization.                                                   Nicki Guadalajara, M.D.    TK/MEDQ  D:  11/25/2002  T:  11/25/2002  Job:  [id number]   cc:   Cardiac Catheterization Laboratory   Nanetta Batty, M.D.  [address], [address]  [place]  [place] [address]  Fax: [phone]   Orville Govern  Lennette Bihari, M.D.'s Office

## 2010-07-28 NOTE — H&P (Signed)
NAMESTEPHAINE, Jessica [name]                         ACCOUNT NO.:  [id number]   MEDICAL RECORD NO.:  [id number]                   PATIENT TYPE:  EMS   LOCATION:  ED                                   FACILITY:  [facility]   PHYSICIAN:  Quita Skye. Waldon Reining, MD             DATE OF BIRTH:  1960/04/02   DATE OF ADMISSION:  11/23/2002  DATE OF DISCHARGE:                                HISTORY & PHYSICAL   HISTORY OF PRESENT ILLNESS:  Jessica [name] is a 50 year old black woman  who is admitted to [facility] for further evaluation of chest  pain.   The patient has a history of cardiac disease which dates back to 1996, at  which time she suffered an acute anterior myocardial infarction.  She was  hospitalized at [facility].  Her course was  complicated by a cardiac arrest.  She subsequently underwent PTCA of the  LAD.  In July of 2003, she returned because of recurrent chest pain.  Cardiac catheterization demonstrated proximal high obtuse marginal branch  stenosis.  She underwent successful stenting of this procedure.   The patient had been relatively free of symptoms until today.  While lying  in bed this afternoon, she experienced an episode of chest pain.  This was  described as a substernal pressure.  It was associated with an ache in her  left arm.  There was mild dyspnea but no diaphoresis or nausea.  There were  no exacerbating or ameliorating factors.  It appeared not to be related to  position, activity, meals, or respirations.  Her chest pressure resolved  within approximately 30 minutes after her second nitroglycerin tablet.  The  left arm discomfort took nearly three hours to resolve.  It is this which  prompted her visit to the emergency department.  At the time that she had  presented to the emergency department, she was not experiencing any further  chest discomfort or left arm discomfort.  She is asymptomatic at this time.   The patient has a  history of hypertension and hyperlipidemia, both of which  are treated with medications.  She continues to smoke.  There is a strong  family history of coronary artery disease.  There is no history of diabetes  mellitus.   PAST SURGICAL HISTORY:  The patient's only operations were the angioplasty  and stents as described above.   SOCIAL HISTORY:  The patient is separated.  She lives with her son.  She  works in the [employer] at [employer].   ALLERGIES:  She is not allergic to any medications.   CURRENT MEDICATIONS:  Her current medications include Toprol-XL, Zocor, and  Plavix.   PAST MEDICAL HISTORY:  Past medical history is otherwise unremarkable.   REVIEW OF SYSTEMS:  Review of systems reveals no new problems related to her  head, eyes, ears, nose, mouth, throat, lungs, gastrointestinal system,  genitourinary system, or extremities.  There is no history of neurologic or  psychiatric disorder.  There is no history of fever, chills, or weight loss.   PHYSICAL EXAMINATION:  VITAL SIGNS:  Blood pressure 141/85.  Pulse 86 and  regular.  Respirations 18.  Temperature 98.6.  GENERAL:  The patient was an obese, middle-aged black woman in no  discomfort.  She was alert, oriented, appropriate, and responsive.  HEENT:  Head, eyes, nose and mouth were unremarkable.  NECK:  The neck was without thyromegaly or adenopathy.  Carotid pulses were  palpable bilaterally and without bruits.  CARDIAC:  Examination revealed a normal S1 and S2.  There was no S3, S4,  murmur, or click.  Cardiac rhythm was regular.  No chest wall tenderness was  noted.  LUNGS:  The lungs demonstrated rare basilar crackles.  They were otherwise  clear.  ABDOMEN:  The abdomen was soft and nontender.  There was no mass,  hepatosplenomegaly, bruit, distention, rebound, guarding, or rigidity.  Bowel sounds were normal.  BREASTS, PELVIC AND RECTAL:  Examinations were not performed as they were  not  pertinent for the reason for acute care hospitalization.  EXTREMITIES:  The extremities were without edema, deviation, or deformity.  Radial and dorsalis pedal pulses were palpable bilaterally.  NEUROLOGIC:  Brief screening neurologic survey was unremarkable.   LABORATORY AND ACCESSORY CLINICAL DATA:  The electrocardiogram was normal.  The chest radiograph report was pending at the time of this dictation.   Initial CK was 93 with a CK-MB of 0.8 and troponin of 0.02.  White count was  6.4 with a hemoglobin of 14.5 and hematocrit of 42.0.  Potassium was 3.7  with a BUN of 9 and creatinine of 0.8.  The remaining studies were pending  at the time of this dictation.   IMPRESSION:  1. Unstable angina.  2. Coronary artery disease, status post anterior myocardial infarction in     1996 resulting in percutaneous transluminal coronary angioplasty of the     left anterior descending, status post high obtuse marginal stent in July     2003.  3. Hypertension.  4. Hyperlipidemia.   PLAN:  1. Telemetry.  2. Serial cardiac enzymes.  3. Aspirin.  4. Nitrol paste.  5. Lovenox.  6. Continue Toprol-XL.  7. Continue Plavix.  8. Discontinue smoking.  9.     Nothing by mouth (n.p.o.).  10.      Further evaluation per Dr. Nanetta Batty.   The patient's son was present throughout the patient encounter.                                                Quita Skye. Waldon Reining, MD    MSC/MEDQ  D:  11/23/2002  T:  11/24/2002  Job:  [id number]   cc:   Nanetta Batty, M.D.  [address], [address]  [place]  [place] [address]  Fax: [phone]

## 2010-07-28 NOTE — Consult Note (Signed)
NAMEDORICE, STIGGERS                         ACCOUNT NO.:  [id number]   MEDICAL RECORD NO.:  [id number]                   PATIENT TYPE:  INP   LOCATION:  2035                                 FACILITY:  [facility]   PHYSICIAN:  Olene Craven, M.D.            DATE OF BIRTH:  24-Oct-1960   DATE OF CONSULTATION:  07/12/2003  DATE OF DISCHARGE:                                   CONSULTATION   REFERRING PHYSICIAN:  Madaline Savage, M.D.   REASON FOR CONSULTATION:  New onset diabetes.   HISTORY OF PRESENT ILLNESS:  This is a 50 year old African-American female  who is admitted for unstable angina and stent placement, who was found to  have new onset diabetes with a hemoglobin A1C of 1.6.  I had been asked to  start her on diabetic regimen and to get her established in my clinic for  follow-up care.  She currently at this time is pain-free.   REVIEW OF SYSTEMS:  In terms of diabetes, she denies any polyuria or  polydipsia or blurred vision.  She does have a family history of diabetes  with her mother.  She is currently chest pain free after having stent  placement.  She is to be discharged in the morning.  Review of systems at  this time is unremarkable 10+ systems review.   PAST MEDICAL HISTORY:  Significant for coronary artery disease,  hyperlipidemia, hypertension, noncompliance with tobacco abuse.   PAST SURGICAL HISTORY:  She has had stent placement x2 in the past.   SOCIAL HISTORY:  The patient is separated.  She lives with her son. 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   ALLERGIES:  No known drug allergies.   FAMILY HISTORY:  Significant for mother with diabetes and hypertension.   CURRENT MEDICATIONS:  1. Plavix 75 mg p.o. daily.  2. Altace 10 mg p.o. daily.  3. Toprol XL 50 mg p.o. daily.  4. Aspirin 325 mg p.o. daily.  5. Zocor 40 mg p.o. q.h.s.  6. Niaspan 500 mg p.o. q.h.s.  7. Actos 15 mg p.o. daily.  8. Nitroglycerin p.r.n.   PHYSICAL  EXAMINATION:  VITAL SIGNS:  Temperature 97.8, systolic 112,  diastolic 72, pulse 69, respirations 20.  Her last several CBG's ranged from  108 to 144.  96% on room air.  HEENT:  Pupils equal, round, and reactive to light.  Extraocular muscles  intact.  NECK:  Supple, no JVD, no bruit, and no thyromegaly.  LUNGS:  Clear to auscultation.  HEART:  Regular rate and rhythm without murmurs, rubs, or gallops.  ABDOMEN:  Obese, soft, nontender, nondistended with positive bowel sounds.  No hepatosplenomegaly.  EXTREMITIES:  No cyanosis, clubbing, or edema.   LABORATORY DATA:  Hemoglobin A1C is 7.6, cholesterol 156, triglycerides 155,  HDL 31 and cholesterol LDL of 94.  Thyroid T4 was 9.2, T3 was 147.5, TSH was  2.37.  CBC; hemoglobin 11.9, WBC 7.1, platelets 364.  BMP; creatinine 0.8,  BUN 5, hemoglobin A1C 7.6.   IMPRESSION:  The patient presents to [facility] with unstable  angina.  She was found to have coronary artery disease requiring further  stenting. During her hospitalization, she was noted to have elevated glucose  and was found to be new onset diabetic with hemoglobin A1C of 7.6.  At this  time, we will start her on Glucophage 500 mg p.o. b.i.d. to start on Jul 13, 2003, to give her kidneys 48 hours to pass her dye load from her  catheterization.  We will continue her hyperlipidemia agents and  hypertension regimen per cardiology.  We will give her a Glucometer unit and  have her instructed on how to use it properly.  At this time we will  discontinue Actos and use that at a later time.  She continues to smoke and  we discussed with her at length about tobacco abuse. WE will continue to  work on this as an outpatient.  I have offered my name and practice as  follow-up.  A card was given and I have asked her to arrange an appointment  for 10 to 14 days with me.   IMPRESSION:  1. New onset diabetes, A1C of  7.6.  Will start her on Glucophage b.i.d. and     instruct her on  CBG's b.i.d. until we see her back and make further     adjustments.  2. Hypertension.  Continue current regimen per cardiology.  3. Hyperlipidemia.  Continue current recommendations per cardiology.  4. Tobacco abuse.  Discussed with the patient at length.  Will follow up as     an outpatient.   FOLLOW UP:  She will follow up with Dr. Barbee Shropshire in 10 to 14 days.                                               Olene Craven, M.D.    DEH/MEDQ  D:  07/12/2003  T:  07/13/2003  Job:  [id number]   cc:   Madaline Savage, M.D.  [address]  [place]  [place] [address]  Fax: [phone]

## 2010-07-29 DIAGNOSIS — I5023 Acute on chronic systolic (congestive) heart failure: Secondary | ICD-10-CM

## 2010-07-29 LAB — CBC
HCT: 31 % — ABNORMAL LOW (ref 36.0–46.0)
MCH: 25.4 pg — ABNORMAL LOW (ref 26.0–34.0)
MCHC: 33.5 g/dL (ref 30.0–36.0)
RDW: 20.2 % — ABNORMAL HIGH (ref 11.5–15.5)

## 2010-07-29 LAB — GLUCOSE, CAPILLARY
Glucose-Capillary: 202 mg/dL — ABNORMAL HIGH (ref 70–99)
Glucose-Capillary: 279 mg/dL — ABNORMAL HIGH (ref 70–99)

## 2010-07-29 LAB — TSH: TSH: 2.2 u[IU]/mL (ref 0.350–4.500)

## 2010-07-29 LAB — MAGNESIUM: Magnesium: 1.5 mg/dL (ref 1.5–2.5)

## 2010-07-29 LAB — COMPREHENSIVE METABOLIC PANEL
ALT: 9 U/L (ref 0–35)
BUN: 13 mg/dL (ref 6–23)
Calcium: 9.9 mg/dL (ref 8.4–10.5)
Creatinine, Ser: 0.88 mg/dL (ref 0.4–1.2)
Glucose, Bld: 208 mg/dL — ABNORMAL HIGH (ref 70–99)
Sodium: 134 mEq/L — ABNORMAL LOW (ref 135–145)
Total Protein: 7.2 g/dL (ref 6.0–8.3)

## 2010-07-29 LAB — PROTIME-INR: INR: 1.88 — ABNORMAL HIGH (ref 0.00–1.49)

## 2010-07-30 LAB — GLUCOSE, CAPILLARY
Glucose-Capillary: 174 mg/dL — ABNORMAL HIGH (ref 70–99)
Glucose-Capillary: 239 mg/dL — ABNORMAL HIGH (ref 70–99)

## 2010-07-30 LAB — BASIC METABOLIC PANEL
Calcium: 9.5 mg/dL (ref 8.4–10.5)
Creatinine, Ser: 0.99 mg/dL (ref 0.4–1.2)
GFR calc Af Amer: >60 mL/min (ref >60)
GFR calc non Af Amer: 59 mL/min — ABNORMAL LOW (ref >60)
Sodium: 134 mEq/L — ABNORMAL LOW (ref 135–145)

## 2010-07-30 LAB — PROTIME-INR
INR: 1.76 — ABNORMAL HIGH (ref 0.00–1.49)
Prothrombin Time: 20.7 seconds — ABNORMAL HIGH (ref 11.6–15.2)

## 2010-07-31 LAB — GLUCOSE, CAPILLARY
Glucose-Capillary: 253 mg/dL — ABNORMAL HIGH (ref 70–99)
Glucose-Capillary: 234 mg/dL — ABNORMAL HIGH (ref 70–99)

## 2010-07-31 LAB — PROTIME-INR
INR: 1.89 — ABNORMAL HIGH (ref 0.00–1.49)
Prothrombin Time: 21.9 seconds — ABNORMAL HIGH (ref 11.6–15.2)

## 2010-07-31 LAB — BASIC METABOLIC PANEL
BUN: 22 mg/dL (ref 6–23)
Creatinine, Ser: 1.03 mg/dL (ref 0.4–1.2)
GFR calc non Af Amer: 56 mL/min — ABNORMAL LOW (ref >60)
Potassium: 4 mEq/L (ref 3.5–5.1)

## 2010-07-31 LAB — MAGNESIUM: Magnesium: 1.7 mg/dL (ref 1.5–2.5)

## 2010-07-31 LAB — CBC
HCT: 33 % — ABNORMAL LOW (ref 36.0–46.0)
RBC: 4.33 MIL/uL (ref 3.87–5.11)
RDW: 20.3 % — ABNORMAL HIGH (ref 11.5–15.5)
WBC: 4.9 10*3/uL (ref 4.0–10.5)

## 2010-08-01 LAB — CBC
MCHC: 32.2 g/dL (ref 30.0–36.0)
RDW: 20.3 % — ABNORMAL HIGH (ref 11.5–15.5)

## 2010-08-01 LAB — BASIC METABOLIC PANEL
BUN: 27 mg/dL — ABNORMAL HIGH (ref 6–23)
Calcium: 9.7 mg/dL (ref 8.4–10.5)
GFR calc non Af Amer: 53 mL/min — ABNORMAL LOW (ref >60)
Glucose, Bld: 235 mg/dL — ABNORMAL HIGH (ref 70–99)

## 2010-08-01 LAB — HEPARIN LEVEL (UNFRACTIONATED): Heparin Unfractionated: 0.85 IU/mL — ABNORMAL HIGH (ref 0.30–0.70)

## 2010-08-01 LAB — PROTIME-INR: Prothrombin Time: 25.9 seconds — ABNORMAL HIGH (ref 11.6–15.2)

## 2010-08-01 LAB — GLUCOSE, CAPILLARY

## 2010-08-02 NOTE — H&P (Addendum)
NAMETEQUITA, Jessica [name]             ACCOUNT NO.:  [id number]  MEDICAL RECORD NO.:  [id number]           PATIENT TYPE:  I  LOCATION:  4734                         FACILITY:  [facility]  PHYSICIAN:  Luis Abed, MD, FACCDATE OF BIRTH:  [birth year]  DATE OF ADMISSION:  07/28/2010 DATE OF DISCHARGE:                             HISTORY & PHYSICAL   CHIEF COMPLAINT:  Shortness of breath, lower extremity swelling.  PRIMARY CARDIOLOGIST:  Bevelyn Buckles. Bensimhon, MD  HISTORY OF PRESENT ILLNESS:  Jessica [name] is a 50 year old lady with a history of dilated cardiomyopathy with EF of 20%-25% with ICD implantation, chronic systolic heart failure, CAD, and diabetes, who was admitted to the hospital in April, in which, she had both an acute ischemic right leg status post thrombectomy and fasciotomy as well as a left lower extremity DVT.  She was subsequently discharged on Coumadin. She saw Dr. Gala Romney in followup on Jul 19, 2010, and at that time, was recommended to restart her Toprol and her Lasix.  However, she states she has not since restarted Toprol in attempts to decrease her blood pressure, and has not restarted her Lasix until yesterday.  There is a also questionable noncompliance in regards to her spironolactone and Crestor.  Her INR was therapeutic on Jul 19, 2010, at 2.9, but is 1.88 today.  She presents with 2 days of orthopnea, lower extremity edema, shortness of breath, and heart palpitations as well as abdominal distention and decreased appetite.  She has gained about 8 pounds since her last office visit.  She has had no chest pain.  This morning, she took 160 mg of Lasix as well as a metolazone, but had no relief.  She had no immediate urine output and has urinated 3 times since coming to the ER, but states is not as much as she usually does.  Her pro-BNP is only 3186, but clinically she has significant lower extremity edema and abdominal distention on exam.  PAST MEDICAL  HISTORY: 1. Dilated cardiomyopathy with EF of 20%-25% by echo in 2009, status     post single-chamber Biotronik ICD implanted in 2009, with a history     of discharge in November 2011. 2. History of noncompliance. 3. VT. 4. Hypertension. 5. DVT of left lower extremity in April 2012. 6. Acute right lower extremity ischemia in April 2012, status post     embolectomy and fasciotomy.  The discharge summary from that event     states that it was felt cardioembolic in nature and notes     arrhythmia, however, no further details are available. 7. CAD status post multiple stents.     a.     Last cath in 2007, in which, she received cutting      balloon/PTCA to the optional diagonal until RCA PCI with an STEMI      at that time.     b.     Nuclear study in July 2011, showing no ischemia, EF of 25%      reportedly. 8. Dyslipidemia. 9. History of tobacco, alcohol use.  MEDICATIONS: 1. Amaryl 2 mg daily. 2. Metolazone 2.5 mg p.r.n. 3. Toprol-XL  25 mg daily. 4. Protonix 40 mg daily. 5. Potassium chloride 20 mEq b.i.d. 6. Crestor 40 mg daily. 7. Spironolactone 25 mg daily. 8. Coumadin as directed. 9. Lasix 80 mg half tablet daily.  Please note, the patient has been     somewhat noncompliant with her medications, so questionable as to     what she is actually taking.  ALLERGIES:  ACE INHIBITORS cause significant cough.  She was encouraged to try a trial of Diovan at her next visit, if she tolerated her other medication increases.  ADHESIVE TAPE is also an allergy.  SOCIAL HISTORY:  Jessica [name] has 2 sons, 1 daughter.  She is divorced, but is currently engaged.  She has a greater than 10-year history of tobacco abuse, and still occasionally smokes.  She drinks one beer once a month.  No illicit drug use.  FAMILY HISTORY:  Mother is living with diabetes and renal issues. Father died of an MI at 9.  She has one brother who is in excellent health, however, is a drug addict.  REVIEW OF  SYSTEMS:  No fevers, chills, nausea, vomiting, diarrhea, bright red blood per rectum, melena, hematemesis, hematuria.  No syncope.  All other systems reviewed and otherwise negative.  LABORATORY DATA:  Sodium 132, potassium 4, chloride 96, CO2 of 25, glucose 226, BUN 12, creatinine 0.93.  BNP 3186.  INR 1.88.  STUDIES:  Chest x-ray is pending.  PHYSICAL EXAMINATION:  VITAL SIGNS:  Temperature 97.3, pulse 101, respirations 18, blood pressure 106/88, pulse ox 98% on room air. GENERAL:  This is a pleasant African American female in no acute distress. HEENT:  Normocephalic, atraumatic.  Extraocular movements intact and clear sclerae.  Nares are without discharge. NECK:  Supple without carotid bruit. CARDIAC:  Auscultation of the heart reveals regular rhythm, slightly tachycardic with a positive S3.  No murmurs or rubs. LUNGS:  Mostly clear without wheezes, rales, or rhonchi. ABDOMEN:  Soft, slightly distended, nontender with normoactive bowel sounds. EXTREMITIES:  1+ lower extremity edema, pitting, with the right lower extremity with well-healing incision without obvious separation or dehiscence.  Her right lower extremity is somewhat duskier than her left.  Pulses are intact bilaterally. NEUROLOGIC:  She is alert and oriented to time, years, responds to questions appropriately with a normal affect.  ASSESSMENT AND PLAN:  The patient was seen and examined by Dr. Myrtis Ser and myself.  Jessica [name] is a 50 year old female with a history of dilated cardiomyopathy with ejection fraction of 20%-25%, implantable cardioverter-defibrillator implantation, hypertension, coronary artery disease, diabetes, and deep venous thrombosis in April 2012, also in the setting of acute right leg ischemia status post embolectomy and fasciotomy at that time.  She presents with symptoms consistent with systolic heart failure including lower extremity edema, abdominal distention, orthopnea, and an 8-pound weight  gain.  Her examination is consistent mostly with right-sided heart failure.  She was instructed to restart her diuretics on her last office visit on Jul 19, 2010; however, has not done well until yesterday.  She attempted to compensate for her symptoms today by taking extra Lasix from 160 mg as well as metolazone, but has not seen significant output.  She currently is wet on exam.  She does have a complex history of the deep venous thrombosis and arterial embolus in the past, but we think volume is primarily the problem at this time.  We will admit the patient to the hospital and diurese her with 80 mg IV t.i.d.  Dr. Myrtis Ser would  like her to get one dose of metolazone before her next dose of IV Lasix.  With her blood pressure, we will hold off on spironolactone and beta-blocker for now, but this may be considered if she tolerates the IV diuresis.  We will continue Coumadin per pharmacy, but in the meantime ask that they will assist [place] with covering her with heparin.  The plan was discussed with the patient and her family who are in agreement.     Ronie Spies, P.A.C.   ______________________________ Luis Abed, MD, [facility]    DD/MEDQ  D:  07/28/2010  T:  07/29/2010  Job:  [id number]  cc:   Bevelyn Buckles. Bensimhon, MD Janetta Hora. Darrick Penna, MD  Electronically Signed by Willa Rough MD [facility] on 08/02/2010 08:51:33 AM Electronically Signed by Ronie Spies  on 08/11/2010 01:40:34 PM

## 2010-08-09 ENCOUNTER — Ambulatory Visit (INDEPENDENT_AMBULATORY_CARE_PROVIDER_SITE_OTHER): Payer: Medicare Other | Admitting: Physician Assistant

## 2010-08-09 ENCOUNTER — Ambulatory Visit (INDEPENDENT_AMBULATORY_CARE_PROVIDER_SITE_OTHER): Payer: Medicare Other | Admitting: *Deleted

## 2010-08-09 ENCOUNTER — Other Ambulatory Visit: Payer: Self-pay | Admitting: Internal Medicine

## 2010-08-09 ENCOUNTER — Encounter: Payer: Self-pay | Admitting: Physician Assistant

## 2010-08-09 VITALS — BP 99/71 | HR 102 | Ht 63.0 in | Wt 156.1 lb

## 2010-08-09 DIAGNOSIS — I5023 Acute on chronic systolic (congestive) heart failure: Secondary | ICD-10-CM

## 2010-08-09 DIAGNOSIS — I472 Ventricular tachycardia, unspecified: Secondary | ICD-10-CM

## 2010-08-09 DIAGNOSIS — I824Z9 Acute embolism and thrombosis of unspecified deep veins of unspecified distal lower extremity: Secondary | ICD-10-CM

## 2010-08-09 LAB — BASIC METABOLIC PANEL
BUN: 11 mg/dL (ref 6–23)
Chloride: 98 mEq/L (ref 96–112)
Glucose, Bld: 247 mg/dL — ABNORMAL HIGH (ref 70–99)
Potassium: 4.2 mEq/L (ref 3.5–5.1)

## 2010-08-09 LAB — BRAIN NATRIURETIC PEPTIDE: Pro B Natriuretic peptide (BNP): 888 pg/mL — ABNORMAL HIGH (ref 0.0–100.0)

## 2010-08-09 NOTE — Progress Notes (Signed)
History of Present Illness: Primary Cardiologist:  Dr. Arvilla Meres  Jessica [name] is a 50 y.o. female with a history of severe congestive heart failure, secondary ischemic cardiomyopathy with an ejection fraction of 25-30%. She also has a history of coronary artery disease, status post multiple interventions and stents, diabetes, hyperlipidemia, history of ongoing tobacco and possibly alcohol use. She has been followed in the heart failure clinic and has had a problem with noncompliance.  She has inolerance of ACE-I due to severe cough.  Had stress test in 09/2009 for CP. EF: 25 %. LV markedly dilated. 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 No ischemia.  She had an ICD shock in Nov 2011.   On June 20, 2010, she had a right iliofemoral embolectomy and compartment fasciotomy of the right leg by Dr. Darrick Penna.  Also found to have LLE DVT.  Diovan, Toprol and lasix held at discharge.  At last visit with Dr. Gala Romney, he tried to restart beta blocker and diuretics.  However, she did not start these right away. She then presented to [facility] 5/18 and was admitted for 4 days with acute on chronic systolic heart failure.  She had an 8 pound weight gain and her pro BNP was 3186 on admission.  She responded well to IV diuresis and was discharged after about a 14+ pound weight loss at 70 kg.  Her blood pressure was soft and her ARB was not added back to her medical regimen.  She returns today for follow up.  She is doing ok.  DOE is stable at NYHA class 2b.  She sleeps on 5 pillows.  No change since discharge.  Notes coughing up "frothy" sputum.  Present before discharge from hospital.  No change.  No hemoptysis.  No purulent sputum.  No fevers or chills.  INR today 2.4 and managed in our coumadin clinic.  She notes edema in legs unchanged since discharge from hospital.  No syncope.  No chest pain.  Not taking ASA  anymore.  Past Medical History  Diagnosis Date  . Chronic systolic heart failure     a.  secondary to severe ischemic cardiomyopathy with EF 20-25%;  b. s/p rimplantation of Biotronik single chamber ICD by Dr Ladona Ridgel 02/2008;  c.  Right heart cath 04/2007: RA 18, PA 56/30 (40), PCWP 36. FICK 3.89/2.06 [place];  d. echo 9/09: Ef 20-25%, mild MR, mild LAE  . CAD (coronary artery disease)     a. s/p multiple PCIs; b. cath 10/07: LAD stent 40%, dx 80% ISR - tx with POBA; OM stent ok; ostial OM 60%; RCA occluded with L-R collats  . Tobacco abuse   . Alcohol abuse   . History of noncompliance with medical treatment   . Diabetes mellitus, type 2   . Dizziness   . HLD (hyperlipidemia)   . DVT (deep venous thrombosis)     coumadin  . Arterial embolism     s/p right iliofemoral embolectomy with fasciotomy  . V-tach   . AICD (automatic cardioverter/defibrillator) present     Current Outpatient Prescriptions  Medication Sig Dispense Refill  . furosemide (LASIX) 80 MG tablet Take 1 tablet (80 mg total) by mouth 2 (two) times daily.      [name] glimepiride (AMARYL) 2 MG tablet Take 2 mg by mouth daily before breakfast.        . HYDROcodone-acetaminophen (LORTAB 5) 5-500 MG per tablet Take 1  tablet by mouth every 6 (six) hours as needed.        . Magnesium Oxide (MAG-OX PO) Take 40 mg by mouth 2 (two) times daily.        . metolazone (ZAROXOLYN) 2.5 MG tablet Take 2.5 mg by mouth as needed.        . metoprolol succinate (TOPROL XL) 25 MG 24 hr tablet Take 1 tablet (25 mg total) by mouth daily.  30 tablet  11  . pantoprazole (PROTONIX) 40 MG tablet Take 40 mg by mouth daily.        . potassium chloride SA (K-DUR,KLOR-CON) 20 MEQ tablet Take 1 tablet (20 mEq total) by mouth 2 (two) times daily.      [name] warfarin (COUMADIN) 2 MG tablet As directed       . DISCONTD: furosemide (LASIX) 80 MG tablet Take 80 mg by mouth daily.        [name] DISCONTD: potassium chloride SA (K-DUR,KLOR-CON) 20 MEQ tablet Take 20 mEq by  mouth daily.        [name] DISCONTD: furosemide (LASIX) 40 MG tablet Take 1 tablet (40 mg total) by mouth daily.  30 tablet  11  . DISCONTD: rosuvastatin (CRESTOR) 40 MG tablet Take 1 tablet (40 mg total) by mouth daily.  30 tablet  6  . DISCONTD: spironolactone (ALDACTONE) 25 MG tablet Take 25 mg by mouth daily.          Allergies: Allergies  Allergen Reactions  . Ramipril     Vital Signs: BP 99/71  Pulse 102  Ht 5\' 3"  (1.6 m)  Wt 156 lb 1.9 oz (70.816 kg)  BMI 27.66 kg/m2  PHYSICAL EXAM: Well nourished, well developed, in no acute distress HEENT: normal Neck: + JVD Cardiac:  normal S1, S2; RRR; no murmur; no gallops Lungs:  clear to auscultation bilaterally, no wheezing, rhonchi or rales Abd: soft, protuberant, nontender, no hepatomegaly Ext: tight trace to 1+ edema, R>L Skin: warm and dry Neuro:  CNs 2-12 intact, no focal abnormalities noted  EKG:  Sinus rhythm, heart rate 94, normal axis, nonspecific ST-T wave changes, left atrial enlargement  ASSESSMENT AND PLAN:

## 2010-08-09 NOTE — Assessment & Plan Note (Signed)
INR therapeutic 

## 2010-08-09 NOTE — Progress Notes (Signed)
icd check by industry  

## 2010-08-09 NOTE — Patient Instructions (Signed)
Your physician recommends that you schedule a follow-up appointment in: KEEP YOUR APPT WITH DR. Gala Romney 08/24/10 AS PER SCOTT WEAVER, PA-C  Your physician has recommended you make the following change in your medication: START METOLAZONE 2.5 MG 1 TABLET 30 MINUTES BEFORE YOUR 1ST LASIX DOSE ON Tuesday AND Thursday; TAKE AN EXTRA POTASSIUM 20 mEq ON Tuesday AND Thursday WHEN YOU TAKE YOUR METOLAZONE.  Your physician recommends that you return for lab work in: TODAY BMET/BNP 428.23  Your physician recommends that you return for lab work in: REPEAT BMET/BNP 08/16/10

## 2010-08-09 NOTE — Discharge Summary (Signed)
Jessica [name], Jessica [name]             ACCOUNT NO.:  [id number]  MEDICAL RECORD NO.:  [id number]           PATIENT TYPE:  I  LOCATION:  4734                         FACILITY:  [facility]  PHYSICIAN:  Bevelyn Buckles. Bensimhon, MDDATE OF BIRTH:  1960-11-20  DATE OF ADMISSION:  07/28/2010 DATE OF DISCHARGE:  08/01/2010                              DISCHARGE SUMMARY   PRIMARY CARDIOLOGIST:  Bevelyn Buckles. Bensimhon, MD.  PRIMARY CARE PROVIDER:  Osvaldo Shipper. Spruill, MD  DISCHARGE DIAGNOSIS:  Acute-on-chronic systolic congestive heart failure.  SECONDARY DIAGNOSES: 1. Ischemic cardiomyopathy with an ejection fraction of 20-25% in     2009. 2. Status post single chamber Biotronik AICD in 2009. 3. History of medication, nonadherence. 4. History of ventricular tachycardia. 5. Hypertension. 6. History of deep venous thrombosis of the left lower extremity in     April 2012, on Coumadin therapy. 7. History of right lower extremity ischemia in 2012, status post     embolectomy and fasciotomy. 8. Coronary artery disease, status post prior stenting. 9. Hyperlipidemia. 10.History of tobacco and alcohol abuse. 11.Hypomagnesemia, requiring supplementation. 12.Hypokalemia, requiring supplementation. 13.Diabetes mellitus.  ALLERGIES:  ACE INHIBITORS cause cough.  She is allergic to ADHESIVE TAPE.  PROCEDURES:  None.  HISTORY OF PRESENT ILLNESS:  This is a 50 year old female with prior history of ischemic cardiomyopathy and chronic systolic heart failure, who was recently seen by Dr. Gala Romney on Jul 29, 2010, in clinic with adjustments to her medications including addition of beta-blocker and Lasix therapy.  The patient reported not initiating these drugs despite being prescribed until 1 day prior to admission to the hospital.  Over a 2-day period prior to hospitalization, the patient noted orthopnea, lower extremity edema, dyspnea, palpitations and early satiety and abdominal bloating.  She also noted an  8-pound weight gain.  She presented to the ED on Jul 28, 2010, where her pro-BNP was 3186 and she was noted to have significant volume overload on exam.  She was admitted for evaluation and diuresis.  HOSPITAL COURSE:  The patient responded well to IV diuresis with reduction in weight from 77.2 kg on admission to 70 kg today at discharge.  She had  a negative of approximately 5 liters for the course of admission.  She was transitioned from IV to oral Lasix on Jul 31, 2010, and has been ambulating without difficulty.  Of note, the patient's INR is subtherapeutic on admission at 1.88.  Her INR has been followed closely by Pharmacy and recommended discharge Coumadin dose is 2 mg daily.  The patient was placed on beta-blocker, which she has tolerated, though her blood pressure has been somewhat soft, preventing after initiated ARB therapy.  We will attempt this in the outpatient setting.  Ms. Huie will be discharged home today in good condition.  DISCHARGE LABS:  Hemoglobin 11.5, hematocrit 35.7, WBC 4.7, platelets 268.  INR 2.36.  Sodium 131, potassium 4.4, chloride 90, CO2 28, BUN 27, creatinine 1.08, glucose 235, total bilirubin 2.1, alkaline phosphatase 103, AST 18, ALT 9, total protein 7.2, albumin 2.9, calcium 9.7, magnesium 1.7, CK-MB 2.3, troponin-I less than 0.05.  ProBNP 3186, TSH 2.20.  DISPOSITION:  The patient will be discharged home today in good condition.  FOLLOWUP PLANS AND APPOINTMENTS:  The patient will follow up with [name], PA in our office on Aug 09, 2010, at 11:30 a.m.[name]  She will have Coumadin Clinic follow up on the same day at 11:15 a.m.  She is to follow up with Dr. Gala Romney on August 24, 2010, at 10:45 a.m. and with Dr. Shana Chute as scheduled for additional care of her diabetes.  DISCHARGE MEDICATIONS: 1. Mag-Ox 40 mg b.i.d. 2. Furosemide 80 mg b.i.d. 3. Toprol-XL 25 mg b.i.d. 4. Coumadin 2 mg daily. 5. Albuterol inhaler 1 puff daily p.r.n. 6.  Ammonium lactate topical applied daily as needed. 7. Glimepiride 2 mg daily. 8. Klor-Con 20 mEq b.i.d. 9. Protonix 40 mg daily p.r.n..  OUTSTANDING LABS AND STUDIES:  Follow up INR on Aug 09, 2010.  DURATION DISCHARGE ENCOUNTER:  45 minutes including physician time.     Nicolasa Ducking, ANP   ______________________________ Bevelyn Buckles. Bensimhon, MD    CB/MEDQ  D:  08/01/2010  T:  08/02/2010  Job:  [id number]  cc:   Osvaldo Shipper. Spruill, M.D.  Electronically Signed by Nicolasa Ducking ANP on 08/07/2010 07:46:11 PM Electronically Signed by Arvilla Meres MD on 08/09/2010 10:43:20 PM

## 2010-08-09 NOTE — Assessment & Plan Note (Signed)
Interrogation of her device demonstrates no recent therapies and appropriate function.

## 2010-08-09 NOTE — Assessment & Plan Note (Signed)
Her weights are stable.  However, I believe she still has more volume to lose.  She has significant orthopnea and has reported some PND recently.  She also has productive cough.  This does not seem to be infectious.  INR is therapeutic.  I have recommended that she take metolazone 2.5 mg on Tuesdays and Thursdays only.  She will start tomorrow.  She will take an extra 20 mEq of potassium with this on Tuesdays and Thursdays only.  Check a basic metabolic panel and BNP today.  Check a follow up basic metabolic panel and BNP in one week.  She will keep her appointment with Dr. Gala Romney on June 14 or return sooner as needed.  Blood pressure is still too low to reinitiate ARB.  She is not taking Toprol due to low blood pressure over the last 3 days.  I have asked her to try to take this at bedtime to see if she can tolerate this.

## 2010-08-12 ENCOUNTER — Encounter: Payer: Self-pay | Admitting: *Deleted

## 2010-08-16 ENCOUNTER — Other Ambulatory Visit: Payer: Medicare Other | Admitting: *Deleted

## 2010-08-24 ENCOUNTER — Encounter: Payer: Self-pay | Admitting: Internal Medicine

## 2010-08-24 ENCOUNTER — Telehealth: Payer: Self-pay | Admitting: *Deleted

## 2010-08-24 ENCOUNTER — Ambulatory Visit (INDEPENDENT_AMBULATORY_CARE_PROVIDER_SITE_OTHER): Payer: Medicare Other | Admitting: Internal Medicine

## 2010-08-24 VITALS — BP 92/64 | HR 88 | Resp 18 | Ht 62.0 in | Wt 150.1 lb

## 2010-08-24 DIAGNOSIS — R21 Rash and other nonspecific skin eruption: Secondary | ICD-10-CM

## 2010-08-24 DIAGNOSIS — I5023 Acute on chronic systolic (congestive) heart failure: Secondary | ICD-10-CM

## 2010-08-24 DIAGNOSIS — E876 Hypokalemia: Secondary | ICD-10-CM

## 2010-08-24 DIAGNOSIS — R0602 Shortness of breath: Secondary | ICD-10-CM

## 2010-08-24 DIAGNOSIS — R609 Edema, unspecified: Secondary | ICD-10-CM

## 2010-08-24 DIAGNOSIS — I5022 Chronic systolic (congestive) heart failure: Secondary | ICD-10-CM

## 2010-08-24 DIAGNOSIS — I2510 Atherosclerotic heart disease of native coronary artery without angina pectoris: Secondary | ICD-10-CM

## 2010-08-24 DIAGNOSIS — E785 Hyperlipidemia, unspecified: Secondary | ICD-10-CM

## 2010-08-24 LAB — BASIC METABOLIC PANEL
CO2: 34 mEq/L — ABNORMAL HIGH (ref 19–32)
Chloride: 89 mEq/L — ABNORMAL LOW (ref 96–112)
Creatinine, Ser: 0.7 mg/dL (ref 0.4–1.2)

## 2010-08-24 LAB — BRAIN NATRIURETIC PEPTIDE: Pro B Natriuretic peptide (BNP): 658 pg/mL — ABNORMAL HIGH (ref 0.0–100.0)

## 2010-08-24 MED ORDER — LOVASTATIN 40 MG PO TABS: 40.0000 mg | ORAL_TABLET | Freq: Every day | ORAL | Status: DC

## 2010-08-24 MED ORDER — FLUOCINONIDE-E 0.05 % EX CREA: TOPICAL_CREAM | CUTANEOUS | Status: DC

## 2010-08-24 MED ORDER — SPIRONOLACTONE 25 MG PO TABS: ORAL_TABLET | ORAL | Status: DC

## 2010-08-24 MED ORDER — HYDROXYZINE HCL 10 MG PO TABS: ORAL_TABLET | ORAL | Status: DC

## 2010-08-24 NOTE — Assessment & Plan Note (Signed)
No evidence of ischemia. Continue current regimen.   

## 2010-08-24 NOTE — Assessment & Plan Note (Signed)
Goal LDL < 70. Could not afford Crestor. Start lovastatin 40.

## 2010-08-24 NOTE — Telephone Encounter (Signed)
Pt aware of lab results and med changes. Coming in 6/15 and 6/18 for repeat bmet. Danielle Rankin

## 2010-08-24 NOTE — Patient Instructions (Addendum)
Your physician recommends that you schedule a follow-up appointment in: 1 MONTH WITH DR BENSIMHON  Your physician recommends that you return for lab work in: BMET BNP TODAY  DX 428.0 V58.69 Your physician has recommended you make the following change in your medication: START SPIRONOLACTONE  12.5 MG EVERY DAY AND LOVASTATIN 40 MG EVERY DAY  TAKE METOLAZONE  AS NEEDED  ATARAX 20 MG  EVERY 6 HOURS FOR ITCHING  AND LIDEX CREAM APPLY  TWIVE DAILY TO SPOTS  CALL [facility]  NEXT WEEK  RE RASH

## 2010-08-24 NOTE — Assessment & Plan Note (Signed)
Counseled on need to stop smoking completely. 

## 2010-08-24 NOTE — Assessment & Plan Note (Signed)
Volume status no much improved. Will add back spiro 12.5. Use metolazone only prn to protect weight. Get labs today. Hopefully can add back Diovan and titrate Toprol further soon.

## 2010-08-24 NOTE — Assessment & Plan Note (Signed)
Continue coumadin.  

## 2010-08-24 NOTE — Assessment & Plan Note (Signed)
Will refer back to [facility]

## 2010-08-24 NOTE — Progress Notes (Signed)
History of Present Illness: Primary Cardiologist:  Dr. Arvilla Meres  Jessica [name] is a 50 y.o. female with a history of severe congestive heart failure, secondary ischemic cardiomyopathy with an ejection fraction of 25-30%. She also has a history of coronary artery disease, status post multiple interventions and stents, diabetes, hyperlipidemia, history of ongoing tobacco and possibly alcohol use. She has been followed in the heart failure clinic and has had a problem with noncompliance.  She has inolerance of ACE-I due to severe cough.  Had stress test in 09/2009 for CP. EF: 25 %. LV markedly dilated. 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 No ischemia.  She had an ICD shock in Nov 2011.   On June 20, 2010, she had a right iliofemoral embolectomy and compartment fasciotomy of the right leg by Dr. Darrick Penna.  Also found to have LLE DVT.  Diovan, Toprol and lasix held at discharge. We saw her back in clinic and attempted to restart her meds.  However she then presented to [facility] 5/18 and was admitted for 4 days with acute on chronic systolic heart failure.  She had an 8 pound weight gain and her pro BNP was 3186 on admission.  She responded well to IV diuresis and was discharged after about a 14+ pound weight loss at 70 kg.  Her blood pressure was soft and her ARB was not added back to her medical regimen.  She saw [name] on 5/30 and felt to be still overloaded. Started on metolazone 2.5 on Tues and Thurs. Also ran out of Crestor.  Returns for f/u. Feels much better. Weight down another 6 pounds. (Now 30 pounds down over last 6 months)  DOE is stable at NYHA class II.  Can even clean up house. Edema resolved. Orthopnea now down to 2 pillows (baseline is 4). No CP. Having rash over trunk. Saw dermatologist who gave her lotion which isn't working. Still itching a lot. BP still low. Not dizzy. + fatigue. Smoking 3  cigs/day.    Past Medical History  Diagnosis Date  . Chronic systolic heart failure     a.  secondary to severe ischemic cardiomyopathy with EF 20-25%;  b. s/p rimplantation of Biotronik single chamber ICD by Dr Ladona Ridgel 02/2008;  c.  Right heart cath 04/2007: RA 18, PA 56/30 (40), PCWP 36. FICK 3.89/2.06 [place];  d. echo 9/09: Ef 20-25%, mild MR, mild LAE  . CAD (coronary artery disease)     a. s/p multiple PCIs; b. cath 10/07: LAD stent 40%, dx 80% ISR - tx with POBA; OM stent ok; ostial OM 60%; RCA occluded with L-R collats  . Tobacco abuse   . Alcohol abuse   . History of noncompliance with medical treatment   . Diabetes mellitus, type 2   . Dizziness   . HLD (hyperlipidemia)   . DVT (deep venous thrombosis)     coumadin  . Arterial embolism     s/p right iliofemoral embolectomy with fasciotomy  . V-tach   . AICD (automatic cardioverter/defibrillator) present     Current Outpatient Prescriptions  Medication Sig Dispense Refill  . furosemide (LASIX) 80 MG tablet Take 1 tablet (80 mg total) by mouth 2 (two) times daily.      [name] glimepiride (AMARYL) 2 MG tablet Take 2 mg by mouth daily before breakfast.        . HYDROcodone-acetaminophen (LORTAB 5) 5-500 MG per tablet Take 1  tablet by mouth every 6 (six) hours as needed.        . Magnesium Oxide (MAG-OX PO) Take 40 mg by mouth 2 (two) times daily.        . metolazone (ZAROXOLYN) 2.5 MG tablet Take by mouth. 2 x per week      . metoprolol succinate (TOPROL XL) 25 MG 24 hr tablet Take 1 tablet (25 mg total) by mouth daily.  30 tablet  11  . pantoprazole (PROTONIX) 40 MG tablet Take 40 mg by mouth daily.        . potassium chloride SA (K-DUR,KLOR-CON) 20 MEQ tablet Take 20 mEq by mouth 2 (two) times daily. Take an extra tablet with metolazone      . warfarin (COUMADIN) 2 MG tablet As directed         Allergies: Allergies  Allergen Reactions  . Ramipril     Vital Signs: BP 92/64  Pulse 88  Resp 18  Ht 5\' 2"  (1.575 m)  Wt 150 lb  1.9 oz (68.094 kg)  BMI 27.46 kg/m2  PHYSICAL EXAM: Well nourished, well developed, in no acute distress HEENT: normal Neck: Carotids 2+ bilaterally. No bruits. No JVD Cardiac:  normal S1, S2; RRR; no murmur; no gallops Lungs:  clear to auscultation bilaterally, no wheezing, rhonchi or rales Abd: soft,  nontender, no hepatomegaly. Pustular rash over belly and arms Ext: No cyanosis or clubbing. Post surgical changes on R with 1+ edema no edema on L Skin: warm and dry Neuro:  CNs 2-12 intact, no focal abnormalities noted   ASSESSMENT AND PLAN:

## 2010-08-25 ENCOUNTER — Other Ambulatory Visit (INDEPENDENT_AMBULATORY_CARE_PROVIDER_SITE_OTHER): Payer: Medicare Other | Admitting: *Deleted

## 2010-08-25 DIAGNOSIS — I2510 Atherosclerotic heart disease of native coronary artery without angina pectoris: Secondary | ICD-10-CM

## 2010-08-25 DIAGNOSIS — E876 Hypokalemia: Secondary | ICD-10-CM

## 2010-08-25 DIAGNOSIS — I509 Heart failure, unspecified: Secondary | ICD-10-CM

## 2010-08-25 NOTE — Telephone Encounter (Signed)
Addended by: Alma Friendly on: 08/25/2010 03:12 PM   Modules accepted: Orders

## 2010-08-26 LAB — BASIC METABOLIC PANEL WITH GFR
CO2: 29 mEq/L (ref 19–32)
Calcium: 9.2 mg/dL (ref 8.4–10.5)
GFR, Est African American: >60 mL/min (ref >60)
Glucose, Bld: 284 mg/dL — ABNORMAL HIGH (ref 70–99)
Potassium: 3.5 mEq/L (ref 3.5–5.3)
Sodium: 134 mEq/L — ABNORMAL LOW (ref 135–145)

## 2010-08-28 ENCOUNTER — Other Ambulatory Visit (INDEPENDENT_AMBULATORY_CARE_PROVIDER_SITE_OTHER): Payer: Medicare Other | Admitting: *Deleted

## 2010-08-28 DIAGNOSIS — I5023 Acute on chronic systolic (congestive) heart failure: Secondary | ICD-10-CM

## 2010-08-28 LAB — BASIC METABOLIC PANEL
Chloride: 86 mEq/L — ABNORMAL LOW (ref 96–112)
GFR: 96.87 mL/min (ref >60.00)
Glucose, Bld: 299 mg/dL — ABNORMAL HIGH (ref 70–99)
Potassium: 2.7 mEq/L — CL (ref 3.5–5.1)
Sodium: 134 mEq/L — ABNORMAL LOW (ref 135–145)

## 2010-08-29 ENCOUNTER — Telehealth: Payer: Self-pay | Admitting: *Deleted

## 2010-08-29 DIAGNOSIS — I5022 Chronic systolic (congestive) heart failure: Secondary | ICD-10-CM

## 2010-08-29 NOTE — Progress Notes (Signed)
Hi Heather  I know this morning you said  you were going to talk to Miss Irigoyen, Lorin Picket was wondering if you ever got a hold of her. Danielle Rankin

## 2010-08-29 NOTE — Telephone Encounter (Signed)
Pt aware of lab results will recheck lab tom

## 2010-08-30 ENCOUNTER — Ambulatory Visit (INDEPENDENT_AMBULATORY_CARE_PROVIDER_SITE_OTHER): Payer: Medicare Other | Admitting: *Deleted

## 2010-08-30 ENCOUNTER — Other Ambulatory Visit (INDEPENDENT_AMBULATORY_CARE_PROVIDER_SITE_OTHER): Payer: Medicare Other | Admitting: *Deleted

## 2010-08-30 DIAGNOSIS — I824Z9 Acute embolism and thrombosis of unspecified deep veins of unspecified distal lower extremity: Secondary | ICD-10-CM

## 2010-08-30 DIAGNOSIS — I5022 Chronic systolic (congestive) heart failure: Secondary | ICD-10-CM

## 2010-08-30 LAB — BASIC METABOLIC PANEL
BUN: 12 mg/dL (ref 6–23)
CO2: 32 mEq/L (ref 19–32)
Calcium: 9.2 mg/dL (ref 8.4–10.5)
Chloride: 96 mEq/L (ref 96–112)
Creatinine, Ser: 0.7 mg/dL (ref 0.4–1.2)
Glucose, Bld: 184 mg/dL — ABNORMAL HIGH (ref 70–99)

## 2010-08-30 LAB — POCT INR: INR: 1.5

## 2010-09-20 ENCOUNTER — Ambulatory Visit (INDEPENDENT_AMBULATORY_CARE_PROVIDER_SITE_OTHER): Payer: Medicare Other | Admitting: *Deleted

## 2010-09-20 ENCOUNTER — Ambulatory Visit (INDEPENDENT_AMBULATORY_CARE_PROVIDER_SITE_OTHER): Payer: Medicare Other | Admitting: Internal Medicine

## 2010-09-20 ENCOUNTER — Encounter: Payer: Self-pay | Admitting: Internal Medicine

## 2010-09-20 VITALS — BP 100/70 | HR 100 | Ht 62.0 in | Wt 151.0 lb

## 2010-09-20 DIAGNOSIS — I5022 Chronic systolic (congestive) heart failure: Secondary | ICD-10-CM

## 2010-09-20 DIAGNOSIS — I2510 Atherosclerotic heart disease of native coronary artery without angina pectoris: Secondary | ICD-10-CM

## 2010-09-20 DIAGNOSIS — I824Z9 Acute embolism and thrombosis of unspecified deep veins of unspecified distal lower extremity: Secondary | ICD-10-CM

## 2010-09-20 MED ORDER — METOPROLOL SUCCINATE ER 25 MG PO TB24: 25.0000 mg | ORAL_TABLET | Freq: Two times a day (BID) | ORAL | Status: DC

## 2010-09-20 MED ORDER — GLUCOSE BLOOD VI STRP: ORAL_STRIP | Status: DC

## 2010-09-20 MED ORDER — WARFARIN SODIUM 2 MG PO TABS: ORAL_TABLET | ORAL | Status: DC

## 2010-09-20 NOTE — Assessment & Plan Note (Signed)
No evidence of ischemia. Continue current regimen.   

## 2010-09-20 NOTE — Progress Notes (Signed)
History of Present Illness: Primary Cardiologist:  Dr. Arvilla Meres  Jessica [name] is a 50 y.o. female with a history of severe congestive heart failure, secondary ischemic cardiomyopathy with an ejection fraction of 25-30%. She also has a history of coronary artery disease, status post multiple interventions and stents, diabetes, hyperlipidemia, history of ongoing tobacco and possibly alcohol use. She has been followed in the heart failure clinic and has had a problem with noncompliance.  She has inolerance of ACE-I due to severe cough.  Had stress test in 09/2009 for CP. EF: 25 %. LV markedly dilated. 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 No ischemia.  She had an ICD shock in Nov 2011.   On June 20, 2010, she had a right iliofemoral embolectomy and compartment fasciotomy of the right leg by Dr. Darrick Penna.  Also found to have LLE DVT.  Diovan, Toprol and lasix held at discharge. We saw her back in clinic and attempted to restart her meds.  However she then presented to [facility] 5/18 and was admitted for 4 days with acute on chronic systolic heart failure.  She had an 8 pound weight gain and her pro BNP was 3186 on admission.  She responded well to IV diuresis and was discharged after about a 14+ pound weight loss at 70 kg.  Her blood pressure was soft and her ARB was not added back to her medical regimen.  She saw [name] on 5/30 and felt to be still overloaded. Started on metolazone 2.5 on Tues and Thurs. Also ran out of Crestor.  Returns for f/u. Feels ok. Weight about the same.. (Now 30 pounds down over last 6 months)  DOE is stable at NYHA class II-III.  Edema back slightly. BP still low. Not dizzy. + fatigue. Smoking 3 cigs/day.    Past Medical History  Diagnosis Date  . Chronic systolic heart failure     a.  secondary to severe ischemic cardiomyopathy with EF 20-25%;  b. s/p rimplantation of Biotronik  single chamber ICD by Dr Ladona Ridgel 02/2008;  c.  Right heart cath 04/2007: RA 18, PA 56/30 (40), PCWP 36. FICK 3.89/2.06 [place];  d. echo 9/09: Ef 20-25%, mild MR, mild LAE  . CAD (coronary artery disease)     a. s/p multiple PCIs; b. cath 10/07: LAD stent 40%, dx 80% ISR - tx with POBA; OM stent ok; ostial OM 60%; RCA occluded with L-R collats  . Tobacco abuse   . Alcohol abuse   . History of noncompliance with medical treatment   . Diabetes mellitus, type 2   . Dizziness   . HLD (hyperlipidemia)   . DVT (deep venous thrombosis)     coumadin  . Arterial embolism     s/p right iliofemoral embolectomy with fasciotomy  . V-tach   . AICD (automatic cardioverter/defibrillator) present     Current Outpatient Prescriptions  Medication Sig Dispense Refill  . fluocinonide-emollient (LIDEX-E) 0.05 % cream APPLY TWICE DAILY TO SPOTS./  30 g  0  . furosemide (LASIX) 80 MG tablet Take 1 tablet (80 mg total) by mouth 2 (two) times daily.      [name] glimepiride (AMARYL) 2 MG tablet Take 2 mg by mouth daily before breakfast.        . HYDROcodone-acetaminophen (LORTAB 5) 5-500 MG per tablet Take 1 tablet by mouth every 6 (six) hours as needed.        [name]  hydrOXYzine (ATARAX) 10 MG tablet TAKLE 2 TABS  EVERY 6 HOURS FOR ITCHING  60 tablet  0  . lovastatin (MEVACOR) 40 MG tablet Take 1 tablet (40 mg total) by mouth at bedtime.  30 tablet  11  . Magnesium Oxide (MAG-OX PO) Take 40 mg by mouth 2 (two) times daily.        . metolazone (ZAROXOLYN) 2.5 MG tablet Take by mouth. 2 x per week      . metoprolol succinate (TOPROL XL) 25 MG 24 hr tablet Take 1 tablet (25 mg total) by mouth daily.  30 tablet  11  . pantoprazole (PROTONIX) 40 MG tablet Take 40 mg by mouth daily.        . potassium chloride SA (K-DUR,KLOR-CON) 20 MEQ tablet ON 6/14 AND 6/15 TAKE 40 mEq;  ON 6/16 START TAKING 40 mEq IN THE MORNING AND 20 mEq IN THE PM  Take an extra tablet with metolazone       . spironolactone (ALDACTONE) 25 MG tablet 1/2  TAB DAILY  30 tablet  6  . warfarin (COUMADIN) 2 MG tablet Take as directed by Anticoagulation clinic   30 tablet  3    Allergies: Allergies  Allergen Reactions  . Ramipril     Vital Signs: BP 100/70  Pulse 100  Ht 5\' 2"  (1.575 m)  Wt 151 lb (68.493 kg)  BMI 27.62 kg/m2  Vitals - 1 value per visit 01/11/2010 03/21/2010 07/06/2010 07/19/2010 08/09/2010  Weight (lb) 181.12 182 176 163.12 156.12   Vitals - 1 value per visit 08/24/2010 09/20/2010  Weight (lb) 150.12 151    PHYSICAL EXAM: Well nourished, well developed, in no acute distress HEENT: normal Neck: Carotids 2+ bilaterally. No bruits. JVP 9 Cardiac:  normal S1, S2; RRR; no murmur; no gallops Lungs:  clear to auscultation bilaterally, no wheezing, rhonchi or rales Abd: soft,  nontender, no hepatomegaly. Pustular rash over belly and arms Ext: No cyanosis or clubbing. Post surgical changes on R with 1-2+ edema no 1+ edema on L Skin: warm and dry Neuro:  CNs 2-12 intact, no focal abnormalities noted   ASSESSMENT AND PLAN:

## 2010-09-20 NOTE — Patient Instructions (Signed)
Increase Metoprolol to 25 mg Twice daily   Your physician recommends that you return for lab work in: 1 week  Your physician recommends that you schedule a follow-up appointment in: 1 month in Heart Failure Clinic

## 2010-09-20 NOTE — Assessment & Plan Note (Signed)
NYHA III. With mild volume overload. Continue with sliding scale metolazone as needed. Check labs next week. Increase Toprol to 25 BID as BP tolerates. Unfortunately unable to tolerate ACE-I or ARB due to low BP. Stressed need to stop smoking.

## 2010-09-28 ENCOUNTER — Other Ambulatory Visit: Payer: Medicare Other | Admitting: *Deleted

## 2010-09-29 ENCOUNTER — Telehealth: Payer: Self-pay | Admitting: Internal Medicine

## 2010-09-29 NOTE — Telephone Encounter (Signed)
rx was sent in at [facility] 7/11, called [facility] pharmacist there states she has rx and pt can pick up at her convenience

## 2010-09-29 NOTE — Telephone Encounter (Signed)
Pt calling re getting refill for her diabetic testing supplies faxed to [facility], during last visit was told heather would take of it but as of right now not there

## 2010-10-11 ENCOUNTER — Encounter: Payer: Medicare Other | Admitting: *Deleted

## 2010-10-23 ENCOUNTER — Inpatient Hospital Stay (HOSPITAL_COMMUNITY): Admission: RE | Admit: 2010-10-23 | Payer: Medicare Other | Source: Ambulatory Visit

## 2010-11-16 ENCOUNTER — Ambulatory Visit (HOSPITAL_COMMUNITY): Payer: Medicare Other

## 2010-11-21 ENCOUNTER — Ambulatory Visit (INDEPENDENT_AMBULATORY_CARE_PROVIDER_SITE_OTHER): Payer: Medicare Other | Admitting: Internal Medicine

## 2010-11-21 ENCOUNTER — Encounter: Payer: Self-pay | Admitting: Internal Medicine

## 2010-11-21 ENCOUNTER — Encounter (INDEPENDENT_AMBULATORY_CARE_PROVIDER_SITE_OTHER): Payer: Medicare Other

## 2010-11-21 DIAGNOSIS — R0989 Other specified symptoms and signs involving the circulatory and respiratory systems: Secondary | ICD-10-CM

## 2010-11-21 DIAGNOSIS — I5023 Acute on chronic systolic (congestive) heart failure: Secondary | ICD-10-CM

## 2010-11-21 DIAGNOSIS — I472 Ventricular tachycardia: Secondary | ICD-10-CM

## 2010-11-21 DIAGNOSIS — I5022 Chronic systolic (congestive) heart failure: Secondary | ICD-10-CM

## 2010-11-21 DIAGNOSIS — Z95810 Presence of automatic (implantable) cardiac defibrillator: Secondary | ICD-10-CM

## 2010-11-21 LAB — ICD DEVICE OBSERVATION
BATTERY VOLTAGE: 3.1 V
CHARGE TIME: 11 s
DEV-0020ICD: NEGATIVE
HV IMPEDENCE: 37 Ohm
RV LEAD IMPEDENCE ICD: 481 Ohm

## 2010-11-21 NOTE — Patient Instructions (Signed)
Your physician wants you to follow-up in: 6 months with Dr Taylor You will receive a reminder letter in the mail two months in advance. If you don't receive a letter, please call our office to schedule the follow-up appointment.  

## 2010-11-21 NOTE — Progress Notes (Signed)
HPI Jessica [name] returns today for followup. She is a pleasant middle-aged woman with a nonischemic cardiomyopathy, chronic systolic heart failure, status post ICD implantation. The patient also has ventricular tachycardia and has had successful antitachycardia pacing therapies. She denies chest pain. She does note fatigue and weakness. She has not been hospitalized for congestive heart failure. She denies syncope or any recent ICD shocks. Allergies  Allergen Reactions  . Ramipril      Current Outpatient Prescriptions  Medication Sig Dispense Refill  . furosemide (LASIX) 80 MG tablet Take 1 tablet (80 mg total) by mouth 2 (two) times daily.      [name] glimepiride (AMARYL) 2 MG tablet Take 2 mg by mouth daily before breakfast.        . glucose blood (ACCU-CHEK AVIVA) test strip Use as instructed  100 each  12  . lovastatin (MEVACOR) 40 MG tablet Take 1 tablet (40 mg total) by mouth at bedtime.  30 tablet  11  . Magnesium Oxide (MAG-OX PO) Take 40 mg by mouth 2 (two) times daily.        . metolazone (ZAROXOLYN) 2.5 MG tablet Take by mouth. 2 x per week      . metoprolol succinate (TOPROL XL) 25 MG 24 hr tablet Take 1 tablet (25 mg total) by mouth 2 (two) times daily.  30 tablet  11  . pantoprazole (PROTONIX) 40 MG tablet Take 40 mg by mouth daily.        . potassium chloride SA (K-DUR,KLOR-CON) 20 MEQ tablet Take 20 mEq by mouth 2 (two) times daily.   Take an extra tablet with metolazone       . spironolactone (ALDACTONE) 25 MG tablet Take 25 mg by mouth daily.       [name] warfarin (COUMADIN) 2 MG tablet Take as directed by Anticoagulation clinic   30 tablet  3     Past Medical History  Diagnosis Date  . Chronic systolic heart failure     a.  secondary to severe ischemic cardiomyopathy with EF 20-25%;  b. s/p rimplantation of Biotronik single chamber ICD by Dr Ladona Ridgel 02/2008;  c.  Right heart cath 04/2007: RA 18, PA 56/30 (40), PCWP 36. FICK 3.89/2.06 [place];  d. echo 9/09: Ef 20-25%, mild MR,  mild LAE  . CAD (coronary artery disease)     a. s/p multiple PCIs; b. cath 10/07: LAD stent 40%, dx 80% ISR - tx with POBA; OM stent ok; ostial OM 60%; RCA occluded with L-R collats  . Tobacco abuse   . Alcohol abuse   . History of noncompliance with medical treatment   . Diabetes mellitus, type 2   . Dizziness   . HLD (hyperlipidemia)   . DVT (deep venous thrombosis)     coumadin  . Arterial embolism     s/p right iliofemoral embolectomy with fasciotomy  . V-tach   . AICD (automatic cardioverter/defibrillator) present     ROS:   All systems reviewed and negative except as noted in the HPI.   No past surgical history on file.   Family History  Problem Relation Age of Onset  . Coronary artery disease      FMILY HISTORY     History   Social History  . Marital Status: Divorced    Spouse Name: N/A    Number of Children: N/A  . Years of Education: N/A   Occupational History  . disabled    Social History Main Topics  . Smoking status: Current  Some Day Smoker -- 0.1 packs/day    Types: Cigarettes  . Smokeless tobacco: Not on file  . Alcohol Use: Yes  . Drug Use: No  . Sexually Active: Not on file   Other Topics Concern  . Not on file   Social History Narrative  . No narrative on file     BP 101/75  Pulse 99  Resp 16  Ht 5\' 3"  (1.6 m)  Wt 152 lb 6.4 oz (69.128 kg)  BMI 27.00 kg/m2  Physical Exam:  Well appearing middle-aged woman, NAD HEENT: Unremarkable Neck:  No JVD, no thyromegally Lymphatics:  No adenopathy Back:  No CVA tenderness Lungs:  Clear. Well-healed ICD incision. HEART:  Regular rate rhythm, no murmurs, no rubs, no clicks. PMI is enlarged and laterally displaced. Abd:  soft, positive bowel sounds, no organomegally, no rebound, no guarding Ext:  2 plus pulses, no edema, no cyanosis, no clubbing Skin:  No rashes no nodules Neuro:  CN II through XII intact, motor grossly intact  DEVICE  Normal device function.  See PaceArt for  details. Noise is present on the ICD lead. The device has been reprogrammed to try to minimize unnecessary ICD shocks.  Assess/Plan:

## 2010-11-21 NOTE — Assessment & Plan Note (Signed)
Her symptoms are class II. She admits to some sodium indiscretion and I have counseled her on the importance of maintaining a low-sodium diet. She will continue her current medical therapy.

## 2010-11-21 NOTE — Assessment & Plan Note (Signed)
She has had several episodes with successful anti-tachycardic pacing therapy. We'll plan to follow.

## 2010-11-21 NOTE — Assessment & Plan Note (Signed)
Very short brief episodes of noise are noted today. They cannot be reproduced. Her device has been reprogrammed to try and minimize unnecessary ICD shocks do to noise.

## 2010-11-22 ENCOUNTER — Inpatient Hospital Stay (HOSPITAL_COMMUNITY): Admission: RE | Admit: 2010-11-22 | Payer: Medicare Other | Source: Ambulatory Visit

## 2010-11-23 ENCOUNTER — Ambulatory Visit (INDEPENDENT_AMBULATORY_CARE_PROVIDER_SITE_OTHER): Payer: Medicare Other | Admitting: *Deleted

## 2010-11-23 DIAGNOSIS — I824Z9 Acute embolism and thrombosis of unspecified deep veins of unspecified distal lower extremity: Secondary | ICD-10-CM

## 2010-11-23 LAB — POCT INR: INR: 1.4

## 2010-12-01 LAB — POCT CARDIAC MARKERS
CKMB, poc: <1 — ABNORMAL LOW
Myoglobin, poc: 37.4
Operator id: 270651

## 2010-12-01 LAB — DIFFERENTIAL
Basophils Relative: 1
Eosinophils Relative: 1
Monocytes Absolute: 0.1
Neutro Abs: 3.1
Neutrophils Relative %: 61

## 2010-12-01 LAB — CBC
HCT: 37.5
HCT: 33.7 — ABNORMAL LOW
HCT: 35.2 — ABNORMAL LOW
Hemoglobin: 12.1
Hemoglobin: 11 — ABNORMAL LOW
Hemoglobin: 11.6 — ABNORMAL LOW
MCHC: 32.3
MCHC: 32.5
MCHC: 33
MCV: 81.8
MCV: 80.8
MCV: 81.2
Platelets: 286
Platelets: 255
RBC: 4.59
RBC: 4.36
RDW: 18.6 — ABNORMAL HIGH
RDW: 18.8 — ABNORMAL HIGH
RDW: 19 — ABNORMAL HIGH
WBC: 5.3

## 2010-12-01 LAB — BASIC METABOLIC PANEL
BUN: 12
BUN: 12
BUN: 8
BUN: 8
BUN: 9
CO2: 23
CO2: 27
CO2: 29
Calcium: 8.6
Calcium: 9.3
Calcium: 9.3
Chloride: 103
Chloride: 104
Chloride: 106
Creatinine, Ser: 0.68
Creatinine, Ser: 0.83
Creatinine, Ser: 0.88
Creatinine, Ser: 0.9
GFR calc Af Amer: >60
GFR calc Af Amer: >60
GFR calc non Af Amer: >60
GFR calc non Af Amer: >60
GFR calc non Af Amer: >60
GFR calc non Af Amer: >60
GFR calc non Af Amer: >60
GFR calc non Af Amer: >60
Glucose, Bld: 103 — ABNORMAL HIGH
Glucose, Bld: 114 — ABNORMAL HIGH
Glucose, Bld: 122 — ABNORMAL HIGH
Glucose, Bld: 128 — ABNORMAL HIGH
Glucose, Bld: 161 — ABNORMAL HIGH
Glucose, Bld: 185 — ABNORMAL HIGH
Potassium: 3.6
Potassium: 3.6
Potassium: 4
Sodium: 138
Sodium: 138
Sodium: 140

## 2010-12-01 LAB — I-STAT 8, (EC8 V) (CONVERTED LAB)
BUN: 7
Chloride: 105
pCO2, Ven: 35.7 — ABNORMAL LOW
pH, Ven: 7.403 — ABNORMAL HIGH

## 2010-12-01 LAB — POCT I-STAT 3, VENOUS BLOOD GAS (G3P V)
Acid-Base Excess: 2
Bicarbonate: 24.7 — ABNORMAL HIGH
Bicarbonate: 25.9 — ABNORMAL HIGH
O2 Saturation: 52
TCO2: 26
pH, Ven: 7.449 — ABNORMAL HIGH
pO2, Ven: 26 — CL

## 2010-12-01 LAB — URINALYSIS, ROUTINE W REFLEX MICROSCOPIC
Bilirubin Urine: NEGATIVE
Glucose, UA: NEGATIVE
Leukocytes, UA: NEGATIVE
Nitrite: NEGATIVE
Specific Gravity, Urine: 1.011
pH: 6

## 2010-12-01 LAB — HEPATIC FUNCTION PANEL
AST: 18
Albumin: 3 — ABNORMAL LOW
Bilirubin, Direct: 0.4 — ABNORMAL HIGH

## 2010-12-01 LAB — COMPREHENSIVE METABOLIC PANEL
AST: 19
BUN: 6
CO2: 29
Chloride: 102
Creatinine, Ser: 0.8
GFR calc Af Amer: >60
GFR calc non Af Amer: >60
Total Bilirubin: 0.9

## 2010-12-01 LAB — URINE MICROSCOPIC-ADD ON

## 2010-12-01 LAB — TROPONIN I: Troponin I: 0.02

## 2010-12-01 LAB — POCT I-STAT CREATININE
Creatinine, Ser: 0.9
Operator id: 270651

## 2010-12-01 LAB — B-NATRIURETIC PEPTIDE (CONVERTED LAB): Pro B Natriuretic peptide (BNP): 1056 — ABNORMAL HIGH

## 2010-12-01 LAB — APTT: aPTT: 29

## 2010-12-01 LAB — PROTIME-INR
INR: 1.3
Prothrombin Time: 16 — ABNORMAL HIGH

## 2010-12-01 LAB — CARDIAC PANEL(CRET KIN+CKTOT+MB+TROPI)
Total CK: 51
Troponin I: 0.01

## 2010-12-04 ENCOUNTER — Ambulatory Visit (INDEPENDENT_AMBULATORY_CARE_PROVIDER_SITE_OTHER): Payer: Medicare Other | Admitting: *Deleted

## 2010-12-04 DIAGNOSIS — I824Z9 Acute embolism and thrombosis of unspecified deep veins of unspecified distal lower extremity: Secondary | ICD-10-CM

## 2010-12-04 LAB — POCT INR: INR: 1.7

## 2010-12-07 LAB — CBC
HCT: 36.6
Hemoglobin: 12.2
Hemoglobin: 12.8
MCHC: 33.3
MCHC: 33.4
MCV: 84.3
MCV: 84.5
Platelets: 254
RBC: 4.33
RBC: 4.54
RDW: 18.8 — ABNORMAL HIGH
WBC: 5.2
WBC: 5.6

## 2010-12-07 LAB — COMPREHENSIVE METABOLIC PANEL
ALT: 16
ALT: 18
BUN: 10
CO2: 26
CO2: 27
Calcium: 9.1
Calcium: 9.1
Chloride: 107
GFR calc non Af Amer: >60
GFR calc non Af Amer: >60
Glucose, Bld: 115 — ABNORMAL HIGH
Glucose, Bld: 134 — ABNORMAL HIGH
Sodium: 140
Sodium: 141
Total Bilirubin: 1.3 — ABNORMAL HIGH
Total Protein: 7.1

## 2010-12-07 LAB — DIFFERENTIAL
Basophils Absolute: 0
Basophils Relative: 1
Eosinophils Absolute: 0.1
Eosinophils Relative: 3
Lymphocytes Relative: 41
Lymphs Abs: 2.1
Monocytes Absolute: 0.3
Monocytes Relative: 6
Neutro Abs: 2.6
Neutrophils Relative %: 50

## 2010-12-07 LAB — DIGOXIN LEVEL: Digoxin Level: <0.2 — ABNORMAL LOW

## 2010-12-07 LAB — LIPID PANEL
HDL: 24 — ABNORMAL LOW
Total CHOL/HDL Ratio: 5.8
Triglycerides: 72
VLDL: 14

## 2010-12-07 LAB — BASIC METABOLIC PANEL
BUN: 9
Calcium: 9.4
Creatinine, Ser: 0.77
GFR calc non Af Amer: >60

## 2010-12-07 LAB — URINE DRUGS OF ABUSE SCREEN W ALC, ROUTINE (REF LAB)
Amphetamine Screen, Ur: NEGATIVE
Barbiturate Quant, Ur: NEGATIVE
Benzodiazepines.: NEGATIVE
Cocaine Metabolites: NEGATIVE
Creatinine,U: 22.5
Ethyl Alcohol: 10 — ABNORMAL HIGH
Marijuana Metabolite: NEGATIVE
Methadone: NEGATIVE
Opiate Screen, Urine: NEGATIVE
Phencyclidine (PCP): NEGATIVE
Propoxyphene: NEGATIVE

## 2010-12-07 LAB — B-NATRIURETIC PEPTIDE (CONVERTED LAB)
Pro B Natriuretic peptide (BNP): 1161 — ABNORMAL HIGH
Pro B Natriuretic peptide (BNP): 943 — ABNORMAL HIGH

## 2010-12-07 LAB — CARDIAC PANEL(CRET KIN+CKTOT+MB+TROPI)
CK, MB: 2
CK, MB: 2.4
Relative Index: INVALID
Total CK: 38
Total CK: 45
Troponin I: 0.01

## 2010-12-07 LAB — PROTIME-INR
INR: 1.1
INR: 1.1
Prothrombin Time: 14.4
Prothrombin Time: 14.6

## 2010-12-07 LAB — APTT: aPTT: 29

## 2010-12-07 LAB — TSH: TSH: 1.799

## 2010-12-07 LAB — MAGNESIUM: Magnesium: 1.7

## 2010-12-07 LAB — ETHANOL CONFIRM, URINE

## 2010-12-14 ENCOUNTER — Emergency Department (HOSPITAL_COMMUNITY)
Admission: EM | Admit: 2010-12-14 | Discharge: 2010-12-15 | Disposition: A | Discharge status: Home / Self Care | Payer: Medicare Other | Attending: Emergency Medicine | Admitting: Emergency Medicine

## 2010-12-14 ENCOUNTER — Emergency Department (HOSPITAL_COMMUNITY): Payer: Medicare Other

## 2010-12-14 DIAGNOSIS — Z86718 Personal history of other venous thrombosis and embolism: Secondary | ICD-10-CM | POA: Insufficient documentation

## 2010-12-14 DIAGNOSIS — R1110 Vomiting, unspecified: Secondary | ICD-10-CM | POA: Insufficient documentation

## 2010-12-14 DIAGNOSIS — R05 Cough: Secondary | ICD-10-CM | POA: Insufficient documentation

## 2010-12-14 DIAGNOSIS — I509 Heart failure, unspecified: Secondary | ICD-10-CM | POA: Insufficient documentation

## 2010-12-14 DIAGNOSIS — I739 Peripheral vascular disease, unspecified: Secondary | ICD-10-CM | POA: Insufficient documentation

## 2010-12-14 DIAGNOSIS — E119 Type 2 diabetes mellitus without complications: Secondary | ICD-10-CM | POA: Insufficient documentation

## 2010-12-14 DIAGNOSIS — E876 Hypokalemia: Secondary | ICD-10-CM | POA: Insufficient documentation

## 2010-12-14 DIAGNOSIS — I498 Other specified cardiac arrhythmias: Secondary | ICD-10-CM | POA: Insufficient documentation

## 2010-12-14 DIAGNOSIS — I252 Old myocardial infarction: Secondary | ICD-10-CM | POA: Insufficient documentation

## 2010-12-14 DIAGNOSIS — R0602 Shortness of breath: Secondary | ICD-10-CM | POA: Insufficient documentation

## 2010-12-14 DIAGNOSIS — R059 Cough, unspecified: Secondary | ICD-10-CM | POA: Insufficient documentation

## 2010-12-14 DIAGNOSIS — R5381 Other malaise: Secondary | ICD-10-CM | POA: Insufficient documentation

## 2010-12-14 LAB — GLUCOSE, CAPILLARY: Glucose-Capillary: 142 mg/dL — ABNORMAL HIGH (ref 70–99)

## 2010-12-14 LAB — POCT I-STAT TROPONIN I: Troponin i, poc: 0.03 ng/mL (ref 0.00–0.08)

## 2010-12-14 LAB — COMPREHENSIVE METABOLIC PANEL
Albumin: 2.8 g/dL — ABNORMAL LOW (ref 3.5–5.2)
Alkaline Phosphatase: 156 U/L — ABNORMAL HIGH (ref 39–117)
BUN: 11 mg/dL (ref 6–23)
Calcium: 9.1 mg/dL (ref 8.4–10.5)
Potassium: 2.6 mEq/L — CL (ref 3.5–5.1)
Total Protein: 7.4 g/dL (ref 6.0–8.3)

## 2010-12-14 LAB — DIFFERENTIAL
Basophils Relative: 1 % (ref 0–1)
Eosinophils Absolute: 0 10*3/uL (ref 0.0–0.7)
Monocytes Relative: 8 % (ref 3–12)
Neutrophils Relative %: 54 % (ref 43–77)

## 2010-12-14 LAB — CBC
MCH: 27 pg (ref 26.0–34.0)
Platelets: 166 10*3/uL (ref 150–400)
RBC: 4.52 MIL/uL (ref 3.87–5.11)
WBC: 3.6 10*3/uL — ABNORMAL LOW (ref 4.0–10.5)

## 2010-12-14 LAB — CK TOTAL AND CKMB (NOT AT ARMC)
CK, MB: 2.1 ng/mL (ref 0.3–4.0)
Relative Index: INVALID (ref 0.0–2.5)
Total CK: 51 U/L (ref 7–177)

## 2010-12-15 ENCOUNTER — Encounter: Payer: Medicare Other | Admitting: *Deleted

## 2010-12-15 LAB — PROTIME-INR
INR: 1.73 — ABNORMAL HIGH (ref 0.00–1.49)
Prothrombin Time: 20.6 seconds — ABNORMAL HIGH (ref 11.6–15.2)

## 2011-01-30 ENCOUNTER — Telehealth (HOSPITAL_COMMUNITY): Payer: Self-pay | Admitting: *Deleted

## 2011-01-30 DIAGNOSIS — I5022 Chronic systolic (congestive) heart failure: Secondary | ICD-10-CM

## 2011-01-30 DIAGNOSIS — E785 Hyperlipidemia, unspecified: Secondary | ICD-10-CM

## 2011-01-30 DIAGNOSIS — I5023 Acute on chronic systolic (congestive) heart failure: Secondary | ICD-10-CM

## 2011-01-30 MED ORDER — POTASSIUM CHLORIDE CRYS ER 20 MEQ PO TBCR: 20.0000 meq | EXTENDED_RELEASE_TABLET | Freq: Two times a day (BID) | ORAL | Status: DC

## 2011-01-30 MED ORDER — FUROSEMIDE 80 MG PO TABS
80.0000 mg | ORAL_TABLET | Freq: Two times a day (BID) | ORAL | Status: DC
Start: 2011-01-30 — End: 2011-03-08

## 2011-01-30 MED ORDER — SPIRONOLACTONE 25 MG PO TABS: 25.0000 mg | ORAL_TABLET | Freq: Every day | ORAL | Status: DC

## 2011-01-30 MED ORDER — METOPROLOL SUCCINATE ER 25 MG PO TB24: 25.0000 mg | ORAL_TABLET | Freq: Two times a day (BID) | ORAL | Status: DC

## 2011-01-30 MED ORDER — WARFARIN SODIUM 2 MG PO TABS: ORAL_TABLET | ORAL | Status: DC

## 2011-01-30 MED ORDER — LOVASTATIN 40 MG PO TABS
40.0000 mg | ORAL_TABLET | Freq: Every day | ORAL | Status: DC
Start: 2011-01-30 — End: 2011-09-27

## 2011-01-30 MED ORDER — PANTOPRAZOLE SODIUM 40 MG PO TBEC: 40.0000 mg | DELAYED_RELEASE_TABLET | Freq: Every day | ORAL | Status: DC

## 2011-01-30 NOTE — Telephone Encounter (Signed)
Jessica [name] called this am, she needs refills on her medications to be called into her pharmacy.

## 2011-01-30 NOTE — Telephone Encounter (Signed)
Spoke w/pt, all refills sent to pharmacy she states she has been doing ok, appt scheduled for 12/7 and explained importance of keeping this appt pt is agreeable

## 2011-02-09 ENCOUNTER — Other Ambulatory Visit: Payer: Self-pay | Admitting: Internal Medicine

## 2011-02-09 NOTE — Telephone Encounter (Signed)
..   Requested Prescriptions   Pending Prescriptions Disp Refills  . glimepiride (AMARYL) 2 MG tablet [Pharmacy Med Name: GLIMEPIRIDE 2MG      TAB] 30 tablet 6    Sig: TAKE ONE TABLET BY MOUTH EVERY DAY  . metolazone (ZAROXOLYN) 2.5 MG tablet [Pharmacy Med Name: METOLAZONE 2.5MG     TAB] 30 tablet 6    Sig: TAKE ONE TABLET BY MOUTH EVERY DAY AS NEEDED

## 2011-02-16 ENCOUNTER — Encounter (HOSPITAL_COMMUNITY): Payer: Self-pay

## 2011-02-16 ENCOUNTER — Ambulatory Visit (HOSPITAL_COMMUNITY)
Admission: RE | Admit: 2011-02-16 | Discharge: 2011-02-16 | Disposition: A | Discharge status: Home / Self Care | Payer: Medicare Other | Source: Ambulatory Visit | Attending: Internal Medicine | Admitting: Internal Medicine

## 2011-02-16 ENCOUNTER — Encounter (HOSPITAL_COMMUNITY): Payer: Self-pay | Admitting: Adult Health

## 2011-02-16 ENCOUNTER — Other Ambulatory Visit: Payer: Self-pay

## 2011-02-16 VITALS — BP 88/58 | HR 100 | Wt 145.5 lb

## 2011-02-16 DIAGNOSIS — I2510 Atherosclerotic heart disease of native coronary artery without angina pectoris: Secondary | ICD-10-CM

## 2011-02-16 DIAGNOSIS — F17200 Nicotine dependence, unspecified, uncomplicated: Secondary | ICD-10-CM

## 2011-02-16 DIAGNOSIS — R21 Rash and other nonspecific skin eruption: Secondary | ICD-10-CM | POA: Insufficient documentation

## 2011-02-16 DIAGNOSIS — R0989 Other specified symptoms and signs involving the circulatory and respiratory systems: Secondary | ICD-10-CM | POA: Insufficient documentation

## 2011-02-16 DIAGNOSIS — R0609 Other forms of dyspnea: Secondary | ICD-10-CM | POA: Insufficient documentation

## 2011-02-16 DIAGNOSIS — R0602 Shortness of breath: Secondary | ICD-10-CM

## 2011-02-16 DIAGNOSIS — I5022 Chronic systolic (congestive) heart failure: Secondary | ICD-10-CM

## 2011-02-16 LAB — PROTIME-INR
INR: 1.55 — ABNORMAL HIGH (ref 0.00–1.49)
Prothrombin Time: 18.9 seconds — ABNORMAL HIGH (ref 11.6–15.2)

## 2011-02-16 LAB — CBC
Hemoglobin: 12.6 g/dL (ref 12.0–15.0)
MCH: 27.4 pg (ref 26.0–34.0)
RBC: 4.6 MIL/uL (ref 3.87–5.11)

## 2011-02-16 LAB — BASIC METABOLIC PANEL
Chloride: 97 mEq/L (ref 96–112)
GFR calc non Af Amer: >90 mL/min (ref >90)
Glucose, Bld: 224 mg/dL — ABNORMAL HIGH (ref 70–99)
Potassium: 3.9 mEq/L (ref 3.5–5.1)
Sodium: 135 mEq/L (ref 135–145)

## 2011-02-16 MED ORDER — HYDROXYZINE HCL 10 MG PO TABS: ORAL_TABLET | ORAL | Status: DC

## 2011-02-16 MED ORDER — DIGOXIN 125 MCG PO TABS: 125.0000 ug | ORAL_TABLET | Freq: Every day | ORAL | Status: DC

## 2011-02-16 NOTE — Progress Notes (Signed)
History of Present Illness: Primary Cardiologist:  Dr. Arvilla Meres  Jessica [name] is a 50 y.o. female with a history of severe congestive heart failure, secondary ischemic cardiomyopathy with an ejection fraction of 25-30%. She also has a history of coronary artery disease, status post multiple interventions and stents, diabetes, hyperlipidemia, history of ongoing tobacco and possibly alcohol use. She has been followed in the heart failure clinic and has had a problem with noncompliance.  She has inolerance of ACE-I due to severe cough.  Had stress test in 09/2009 for CP. EF: 25 %. LV markedly dilated. 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 No ischemia.  She had an ICD shock in Nov 2011.   On June 20, 2010, she had a right iliofemoral embolectomy and compartment fasciotomy of the right leg by Dr. Darrick Penna.  Also found to have LLE DVT.  Diovan, Toprol and lasix held at discharge. We saw her back in clinic and attempted to restart her meds.  Seen in ER in October with fluid overload. Got IV lasix with 6 lb diuresis and felt better.   Returns for f/u. Feels ok. Weighing every day wt  Stable 139-142 at home. (Now 40 pounds down over last year). Takes metolazone about 2x/month when weight goes up. Says breathing is fairly bad and she attributes to her COPD. Says she gets SOB going to mailbox if not less. Cut back lasix to once per day on her own.  No edema. Chronic 3-4 pillow orthopnea.  . BP still low. Not dizzy. + fatigue. Smoking several cigs/day. If tries to take her metoprolol BP gets really low. Has been off ARB and b-blocker for months due to low BP. Very occasional CP and pain down her L arm. Has been on coumadin for 6 months due to DVT.    Past Medical History  Diagnosis Date  . Chronic systolic heart failure     a.  secondary to severe ischemic cardiomyopathy with EF 20-25%;  b. s/p rimplantation of Biotronik single  chamber ICD by Dr Ladona Ridgel 02/2008;  c.  Right heart cath 04/2007: RA 18, PA 56/30 (40), PCWP 36. FICK 3.89/2.06 [place];  d. echo 9/09: Ef 20-25%, mild MR, mild LAE  . CAD (coronary artery disease)     a. s/p multiple PCIs; b. cath 10/07: LAD stent 40%, dx 80% ISR - tx with POBA; OM stent ok; ostial OM 60%; RCA occluded with L-R collats  . Tobacco abuse   . Alcohol abuse   . History of noncompliance with medical treatment   . Diabetes mellitus, type 2   . Dizziness   . HLD (hyperlipidemia)   . DVT (deep venous thrombosis)     coumadin  . Arterial embolism     s/p right iliofemoral embolectomy with fasciotomy  . V-tach   . AICD (automatic cardioverter/defibrillator) present     Current Outpatient Prescriptions  Medication Sig Dispense Refill  . furosemide (LASIX) 80 MG tablet Take 1 tablet (80 mg total) by mouth 2 (two) times daily.  60 tablet  2  . glimepiride (AMARYL) 2 MG tablet TAKE ONE TABLET BY MOUTH EVERY DAY  30 tablet  6  . glucose blood (ACCU-CHEK AVIVA) test strip Use as instructed  100 each  12  . lovastatin (MEVACOR) 40 MG tablet Take 1 tablet (40 mg total) by mouth at bedtime.  30 tablet  2  . Magnesium Oxide (MAG-OX PO) Take 40 mg  by mouth daily.       . metolazone (ZAROXOLYN) 2.5 MG tablet TAKE ONE TABLET BY MOUTH EVERY DAY AS NEEDED  30 tablet  6  . pantoprazole (PROTONIX) 40 MG tablet Take 1 tablet (40 mg total) by mouth daily.  30 tablet  2  . potassium chloride SA (K-DUR,KLOR-CON) 20 MEQ tablet Take 1 tablet (20 mEq total) by mouth 2 (two) times daily.  60 tablet  2  . spironolactone (ALDACTONE) 25 MG tablet Take 1 tablet (25 mg total) by mouth daily.  30 tablet  2  . warfarin (COUMADIN) 2 MG tablet Take as directed by Coumadin Clinic  30 tablet  0    Allergies: Allergies  Allergen Reactions  . Ramipril     Vital Signs: BP 88/58  Pulse 100  Wt 145 lb 8 oz (65.998 kg)  SpO2 94%  PHYSICAL EXAM: Well nourished, well developed, in no acute distress HEENT:  normal Neck: Carotids 2+ bilaterally. No bruits. JVP 6 Cardiac:  normal S1, S2; RRR; no murmur; no gallops Lungs:  clear to auscultation bilaterally, no wheezing, rhonchi or rales Abd: soft,  nontender, no hepatomegaly. Pustular rash over belly and arms Ext: No cyanosis or clubbing. No edema. DPs 2+ bilaterally Skin: warm and dry Neuro:  CNs 2-12 intact, no focal abnormalities noted  ECG:  SR with PVCS 91. Biatrial enlargement. No ST-T wave abnormalities.    ASSESSMENT AND PLAN:

## 2011-02-16 NOTE — Patient Instructions (Addendum)
Follow up PFTs   . You are scheduled for R and L heart cath Tuesday 02/20/2011.  Parking garage code [id number]  Do not eat or drink anything after midnight  Do not take diabetic medications 02/20/2011  Arrive at  [facility] 10 am 02/20/2011  For 11 am heart cath.   Your physician has requested that you have a cardiac catheterization. Cardiac catheterization is used to diagnose and/or treat various heart conditions. Doctors may recommend this procedure for a number of different reasons. The most common reason is to evaluate chest pain. Chest pain can be a symptom of coronary artery disease (CAD), and cardiac catheterization can show whether plaque is narrowing or blocking your heart's arteries. This procedure is also used to evaluate the valves, as well as measure the blood flow and oxygen levels in different parts of your heart. For further information please visit [email or web address] Please follow instruction sheet, as given.    Take Digoxin 0.125 mg daily

## 2011-02-16 NOTE — Assessment & Plan Note (Signed)
Occasional CP with worsening CHF. Plan R& L heart cath as above.

## 2011-02-16 NOTE — Assessment & Plan Note (Signed)
Continues to struggle with NYHA III-IIIB symptoms despite adequate volume control. BP too low to tolerate any afterload reducers or B-block. We had a long talk about the options. At this point, I feel the best way to proceed is R & LHC to evaluate for filling pressures, cardiac output and coronary anatomy. Will plan this for next Tuesday. May also need a CPX test at some time. No change in medicines at this time.   Total MD time spent = 40 mins with over 70% of that time dedicated to counseling and discussions described above.

## 2011-02-16 NOTE — Assessment & Plan Note (Signed)
Counseled on need for complete smoking cessation.

## 2011-02-17 ENCOUNTER — Telehealth (HOSPITAL_COMMUNITY): Payer: Self-pay | Admitting: Adult Health

## 2011-02-17 NOTE — Telephone Encounter (Signed)
Stop coumadin until after cardiac cath

## 2011-02-20 ENCOUNTER — Encounter (HOSPITAL_BASED_OUTPATIENT_CLINIC_OR_DEPARTMENT_OTHER): Payer: Self-pay | Admitting: Internal Medicine

## 2011-02-20 ENCOUNTER — Inpatient Hospital Stay (HOSPITAL_BASED_OUTPATIENT_CLINIC_OR_DEPARTMENT_OTHER)
Admission: RE | Admit: 2011-02-20 | Discharge: 2011-02-20 | Disposition: A | Discharge status: Hospice | Payer: Medicare Other | Source: Ambulatory Visit | Attending: Internal Medicine | Admitting: Internal Medicine

## 2011-02-20 ENCOUNTER — Encounter (HOSPITAL_BASED_OUTPATIENT_CLINIC_OR_DEPARTMENT_OTHER)
Admission: RE | Disposition: A | Discharge status: Hospice | Payer: Self-pay | Source: Ambulatory Visit | Attending: Internal Medicine

## 2011-02-20 ENCOUNTER — Inpatient Hospital Stay (HOSPITAL_COMMUNITY)
Admission: AD | Admit: 2011-02-20 | Discharge: 2011-02-23 | DRG: 286 | Disposition: A | Discharge status: Home / Self Care | Payer: Medicare Other | Source: Ambulatory Visit | Attending: Internal Medicine | Admitting: Internal Medicine

## 2011-02-20 ENCOUNTER — Encounter (HOSPITAL_COMMUNITY): Payer: Self-pay | Admitting: *Deleted

## 2011-02-20 DIAGNOSIS — F17200 Nicotine dependence, unspecified, uncomplicated: Secondary | ICD-10-CM | POA: Diagnosis present

## 2011-02-20 DIAGNOSIS — I5023 Acute on chronic systolic (congestive) heart failure: Principal | ICD-10-CM

## 2011-02-20 DIAGNOSIS — I5022 Chronic systolic (congestive) heart failure: Secondary | ICD-10-CM

## 2011-02-20 DIAGNOSIS — T82897A Other specified complication of cardiac prosthetic devices, implants and grafts, initial encounter: Secondary | ICD-10-CM | POA: Diagnosis present

## 2011-02-20 DIAGNOSIS — I2589 Other forms of chronic ischemic heart disease: Secondary | ICD-10-CM | POA: Insufficient documentation

## 2011-02-20 DIAGNOSIS — Z91040 Latex allergy status: Secondary | ICD-10-CM

## 2011-02-20 DIAGNOSIS — Z7982 Long term (current) use of aspirin: Secondary | ICD-10-CM

## 2011-02-20 DIAGNOSIS — E119 Type 2 diabetes mellitus without complications: Secondary | ICD-10-CM | POA: Diagnosis present

## 2011-02-20 DIAGNOSIS — Z79899 Other long term (current) drug therapy: Secondary | ICD-10-CM

## 2011-02-20 DIAGNOSIS — I2789 Other specified pulmonary heart diseases: Secondary | ICD-10-CM | POA: Diagnosis present

## 2011-02-20 DIAGNOSIS — Z9119 Patient's noncompliance with other medical treatment and regimen: Secondary | ICD-10-CM | POA: Insufficient documentation

## 2011-02-20 DIAGNOSIS — R079 Chest pain, unspecified: Secondary | ICD-10-CM | POA: Insufficient documentation

## 2011-02-20 DIAGNOSIS — Z888 Allergy status to other drugs, medicaments and biological substances status: Secondary | ICD-10-CM

## 2011-02-20 DIAGNOSIS — I2510 Atherosclerotic heart disease of native coronary artery without angina pectoris: Secondary | ICD-10-CM

## 2011-02-20 DIAGNOSIS — E785 Hyperlipidemia, unspecified: Secondary | ICD-10-CM | POA: Diagnosis present

## 2011-02-20 DIAGNOSIS — K219 Gastro-esophageal reflux disease without esophagitis: Secondary | ICD-10-CM | POA: Diagnosis present

## 2011-02-20 DIAGNOSIS — R570 Cardiogenic shock: Secondary | ICD-10-CM | POA: Diagnosis present

## 2011-02-20 DIAGNOSIS — I509 Heart failure, unspecified: Secondary | ICD-10-CM | POA: Diagnosis present

## 2011-02-20 DIAGNOSIS — Z91199 Patient's noncompliance with other medical treatment and regimen due to unspecified reason: Secondary | ICD-10-CM | POA: Insufficient documentation

## 2011-02-20 DIAGNOSIS — Z6826 Body mass index (BMI) 26.0-26.9, adult: Secondary | ICD-10-CM

## 2011-02-20 DIAGNOSIS — Z8249 Family history of ischemic heart disease and other diseases of the circulatory system: Secondary | ICD-10-CM

## 2011-02-20 DIAGNOSIS — F411 Generalized anxiety disorder: Secondary | ICD-10-CM | POA: Diagnosis present

## 2011-02-20 DIAGNOSIS — Z9861 Coronary angioplasty status: Secondary | ICD-10-CM

## 2011-02-20 DIAGNOSIS — Y849 Medical procedure, unspecified as the cause of abnormal reaction of the patient, or of later complication, without mention of misadventure at the time of the procedure: Secondary | ICD-10-CM | POA: Diagnosis present

## 2011-02-20 DIAGNOSIS — I739 Peripheral vascular disease, unspecified: Secondary | ICD-10-CM

## 2011-02-20 DIAGNOSIS — R21 Rash and other nonspecific skin eruption: Secondary | ICD-10-CM

## 2011-02-20 DIAGNOSIS — I252 Old myocardial infarction: Secondary | ICD-10-CM

## 2011-02-20 DIAGNOSIS — Z86718 Personal history of other venous thrombosis and embolism: Secondary | ICD-10-CM

## 2011-02-20 HISTORY — DX: Acute myocardial infarction, unspecified: I21.9

## 2011-02-20 HISTORY — DX: Sleep apnea, unspecified: G47.30

## 2011-02-20 HISTORY — DX: Anxiety disorder, unspecified: F41.9

## 2011-02-20 HISTORY — DX: Gastro-esophageal reflux disease without esophagitis: K21.9

## 2011-02-20 HISTORY — DX: Chronic obstructive pulmonary disease, unspecified: J44.9

## 2011-02-20 LAB — POCT I-STAT 3, ART BLOOD GAS (G3+)
Bicarbonate: 24.2 mEq/L — ABNORMAL HIGH (ref 20.0–24.0)
pCO2 arterial: 36.6 mmHg (ref 35.0–45.0)
pH, Arterial: 7.429 — ABNORMAL HIGH (ref 7.350–7.400)
pO2, Arterial: 54 mmHg — ABNORMAL LOW (ref 80.0–100.0)

## 2011-02-20 LAB — CBC
HCT: 37.5 % (ref 36.0–46.0)
Hemoglobin: 12.6 g/dL (ref 12.0–15.0)
WBC: 3.9 10*3/uL — ABNORMAL LOW (ref 4.0–10.5)

## 2011-02-20 LAB — POCT I-STAT 3, VENOUS BLOOD GAS (G3P V)
Acid-Base Excess: 1 mmol/L (ref 0.0–2.0)
Acid-Base Excess: 3 mmol/L — ABNORMAL HIGH (ref 0.0–2.0)
Bicarbonate: 28.5 mEq/L — ABNORMAL HIGH (ref 20.0–24.0)
O2 Saturation: 42 %
O2 Saturation: 46 %
TCO2: 28 mmol/L (ref 0–100)
TCO2: 30 mmol/L (ref 0–100)
pO2, Ven: 25 mmHg — CL (ref 30.0–45.0)

## 2011-02-20 LAB — POCT I-STAT GLUCOSE: Operator id: 141321

## 2011-02-20 LAB — BASIC METABOLIC PANEL
BUN: 11 mg/dL (ref 6–23)
Chloride: 98 mEq/L (ref 96–112)
GFR calc Af Amer: >90 mL/min (ref >90)
Potassium: 3.2 mEq/L — ABNORMAL LOW (ref 3.5–5.1)

## 2011-02-20 LAB — GLUCOSE, CAPILLARY: Glucose-Capillary: 354 mg/dL — ABNORMAL HIGH (ref 70–99)

## 2011-02-20 LAB — PROTIME-INR: INR: 1.32 (ref 0.00–1.49)

## 2011-02-20 SURGERY — JV LEFT AND RIGHT HEART CATHETERIZATION WITH CORONARY ANGIOGRAM
Anesthesia: Moderate Sedation

## 2011-02-20 MED ORDER — HYDROXYZINE HCL 10 MG PO TABS
20.0000 mg | ORAL_TABLET | Freq: Four times a day (QID) | ORAL | Status: DC | PRN
  Administered 2011-02-20 – 2011-02-23 (×2): 20 mg via ORAL
  Filled 2011-02-20 (×2): qty 2

## 2011-02-20 MED ORDER — ALPRAZOLAM 0.25 MG PO TABS
0.2500 mg | ORAL_TABLET | Freq: Two times a day (BID) | ORAL | Status: DC | PRN
  Administered 2011-02-22 – 2011-02-23 (×2): 0.25 mg via ORAL
  Filled 2011-02-20 (×2): qty 1

## 2011-02-20 MED ORDER — ONDANSETRON HCL 4 MG/2ML IJ SOLN: 4.0000 mg | Freq: Four times a day (QID) | INTRAMUSCULAR | Status: DC | PRN

## 2011-02-20 MED ORDER — ASPIRIN EC 81 MG PO TBEC
81.0000 mg | DELAYED_RELEASE_TABLET | Freq: Every day | ORAL | Status: DC
  Administered 2011-02-21 – 2011-02-23 (×3): 81 mg via ORAL
  Filled 2011-02-20 (×3): qty 1

## 2011-02-20 MED ORDER — DIGOXIN 125 MCG PO TABS
125.0000 ug | ORAL_TABLET | Freq: Every day | ORAL | Status: DC
  Administered 2011-02-20 – 2011-02-23 (×4): 125 ug via ORAL
  Filled 2011-02-20 (×4): qty 1

## 2011-02-20 MED ORDER — INSULIN ASPART 100 UNIT/ML ~~LOC~~ SOLN: 0.0000 [IU] | Freq: Every day | SUBCUTANEOUS | Status: DC

## 2011-02-20 MED ORDER — SODIUM CHLORIDE 0.9 % IJ SOLN: 3.0000 mL | INTRAMUSCULAR | Status: DC | PRN

## 2011-02-20 MED ORDER — NITROGLYCERIN 0.4 MG SL SUBL: 0.4000 mg | SUBLINGUAL_TABLET | SUBLINGUAL | Status: DC | PRN

## 2011-02-20 MED ORDER — HYDRALAZINE HCL 25 MG PO TABS
12.5000 mg | ORAL_TABLET | Freq: Three times a day (TID) | ORAL | Status: DC
  Administered 2011-02-20: 12.5 mg via ORAL
  Filled 2011-02-20 (×8): qty 0.5

## 2011-02-20 MED ORDER — POTASSIUM CHLORIDE CRYS ER 20 MEQ PO TBCR
20.0000 meq | EXTENDED_RELEASE_TABLET | Freq: Once | ORAL | Status: AC
  Administered 2011-02-20: 20 meq via ORAL
  Filled 2011-02-20: qty 1

## 2011-02-20 MED ORDER — SIMVASTATIN 40 MG PO TABS
40.0000 mg | ORAL_TABLET | Freq: Every day | ORAL | Status: DC
  Administered 2011-02-20 – 2011-02-22 (×3): 40 mg via ORAL
  Filled 2011-02-20 (×4): qty 1

## 2011-02-20 MED ORDER — SODIUM CHLORIDE 0.9 % IV SOLN: 250.0000 mL | INTRAVENOUS | Status: DC | PRN

## 2011-02-20 MED ORDER — HYDROCORTISONE 1 % EX CREA
TOPICAL_CREAM | Freq: Four times a day (QID) | CUTANEOUS | Status: DC | PRN
  Administered 2011-02-21: 12:00:00 via TOPICAL
  Filled 2011-02-20: qty 28

## 2011-02-20 MED ORDER — HEPARIN SOD (PORCINE) IN D5W 100 UNIT/ML IV SOLN
1350.0000 [IU]/h | INTRAVENOUS | Status: DC
  Administered 2011-02-20: 750 [IU]/h via INTRAVENOUS
  Administered 2011-02-21 – 2011-02-23 (×4): 1350 [IU]/h via INTRAVENOUS
  Filled 2011-02-20 (×11): qty 250

## 2011-02-20 MED ORDER — INSULIN ASPART 100 UNIT/ML ~~LOC~~ SOLN
0.0000 [IU] | SUBCUTANEOUS | Status: DC
  Administered 2011-02-20 (×2): 11 [IU] via SUBCUTANEOUS
  Administered 2011-02-21: 5 [IU] via SUBCUTANEOUS
  Filled 2011-02-20: qty 3

## 2011-02-20 MED ORDER — FUROSEMIDE 10 MG/ML IJ SOLN
80.0000 mg | Freq: Two times a day (BID) | INTRAMUSCULAR | Status: DC
  Administered 2011-02-20 – 2011-02-23 (×6): 80 mg via INTRAVENOUS
  Filled 2011-02-20 (×8): qty 8

## 2011-02-20 MED ORDER — SODIUM CHLORIDE 0.9 % IV SOLN
INTRAVENOUS | Status: DC
  Administered 2011-02-20: 10:00:00 via INTRAVENOUS

## 2011-02-20 MED ORDER — GLIMEPIRIDE 2 MG PO TABS
2.0000 mg | ORAL_TABLET | Freq: Every day | ORAL | Status: DC
  Administered 2011-02-21 – 2011-02-23 (×2): 2 mg via ORAL
  Filled 2011-02-20 (×4): qty 1

## 2011-02-20 MED ORDER — SPIRONOLACTONE 25 MG PO TABS
25.0000 mg | ORAL_TABLET | Freq: Every day | ORAL | Status: DC
  Administered 2011-02-20 – 2011-02-23 (×4): 25 mg via ORAL
  Filled 2011-02-20 (×4): qty 1

## 2011-02-20 MED ORDER — POTASSIUM CHLORIDE CRYS ER 20 MEQ PO TBCR
20.0000 meq | EXTENDED_RELEASE_TABLET | Freq: Two times a day (BID) | ORAL | Status: DC
  Administered 2011-02-20 – 2011-02-23 (×6): 20 meq via ORAL
  Filled 2011-02-20 (×9): qty 1

## 2011-02-20 MED ORDER — INSULIN ASPART 100 UNIT/ML ~~LOC~~ SOLN
0.0000 [IU] | Freq: Three times a day (TID) | SUBCUTANEOUS | Status: DC
  Filled 2011-02-20: qty 3

## 2011-02-20 MED ORDER — DIAZEPAM 5 MG PO TABS
5.0000 mg | ORAL_TABLET | Freq: Once | ORAL | Status: AC
  Administered 2011-02-20: 5 mg via ORAL

## 2011-02-20 MED ORDER — ACETAMINOPHEN 325 MG PO TABS
650.0000 mg | ORAL_TABLET | ORAL | Status: DC | PRN
  Administered 2011-02-21: 650 mg via ORAL
  Filled 2011-02-20: qty 2

## 2011-02-20 MED ORDER — PANTOPRAZOLE SODIUM 40 MG PO TBEC
40.0000 mg | DELAYED_RELEASE_TABLET | Freq: Every day | ORAL | Status: DC
  Administered 2011-02-20 – 2011-02-23 (×4): 40 mg via ORAL
  Filled 2011-02-20 (×4): qty 1

## 2011-02-20 MED ORDER — ISOSORBIDE MONONITRATE ER 30 MG PO TB24
30.0000 mg | ORAL_TABLET | Freq: Every day | ORAL | Status: DC
  Administered 2011-02-20 – 2011-02-23 (×4): 30 mg via ORAL
  Filled 2011-02-20 (×4): qty 1

## 2011-02-20 MED ORDER — SODIUM CHLORIDE 0.9 % IJ SOLN
3.0000 mL | Freq: Two times a day (BID) | INTRAMUSCULAR | Status: DC
  Administered 2011-02-22: 3 mL via INTRAVENOUS

## 2011-02-20 NOTE — Interval H&P Note (Signed)
History and Physical Interval Note:  02/20/2011 10:47 AM  Jessica [name]  has presented today for surgery, with the diagnosis of CHF & CP The various methods of treatment have been discussed with the patient and family. After consideration of risks, benefits and other options for treatment, the patient has consented to  Procedure(s): JV LEFT AND RIGHT HEART CATHETERIZATION WITH CORONARY ANGIOGRAM as a surgical intervention .  The patients' history has been reviewed, patient examined, no change in status, stable for surgery.  I have reviewed the patients' chart and labs.  Questions were answered to the patient's satisfaction.     Daniel Bensimhon

## 2011-02-20 NOTE — Op Note (Signed)
Cardiac Cath Procedure Note  Indication: HF & CP  Procedures performed:  1) Right heart cathererization 2) Selective coronary angiography 3) Left heart catheterization 4) Left ventriculogram 5) Abdominal aortogram 6) L subclav angio  Description of procedure:     The risks and indication of the procedure were explained. Consent was signed and placed on the chart. An appropriate timeout was taken prior to the procedure. The right groin was prepped and draped in the routine sterile fashion and anesthetized with 1% local lidocaine.   A 5 FR arterial sheath was placed in the right femoral artery using a modified Seldinger technique. Standard catheters including a JL4, JR4 and angled pigtail were used. All catheter exchanges were made over a wire. A 7 FR venous sheath was placed in the right femoral vein using a modified Seldinger technique. A standard Swan-Ganz catheter was used for the procedure.   Complications:  None apparent  Findings:  RA = 18 RV =  67/10/20 PA =  56/30 (41) PCW = 23 Ao = 97/72 (83) LV = 93/20/30 Fick cardiac output/index = 2.8/1.7 PVR =  6.5 Woods SVR = 1878 FA sat = 88% PA sat = 42%, 46%  There was no signficant gradient across the aortic valve on pullback.  Left main: Normal  LAD: There is a stent in the prox-midsection just after take-off of the D1. Which is subtotally occluded. Mid to distal LAD appears to fill from some antegrade flow as well as L-L collaterals. There is mild plaaquing of distal LAD. There is a stent in the ostium of the D1 which is also subtotally occluded. The D1 is a small to moderate sized vessel  LCX: Large codominant vessel. Ramus is totally occluded in  he proximal stent. Proximal LCX has 20% stenosis. OM-3 moderate-sized vessel - 90% lesion ostially just prior to proximal stent. Stent is OK.   RCA:  Small co-dominant vessel. Long stent in the midsection which is totally occluded. Distal RCA is small and fills from R to R and  L to R collaterals.   LV-gram done in the RAO projection: Ejection fraction = 10%. Only base of the heart contracts. Apical scar  L subclav angio: Mild plaque.   Abdominal angio: Renal arteries patent bilaterally. Mild plaquing of ab aorta.  Assessment: 1. 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 EF 10% with elevated filling pressures and cardiogenic shock physiology 3. Mixed pulmonary HTN  Plan/Discussion:  Admit for diuresis and HF tune-up. Will likely need milrinone. Get thallium scan to assess for viability. If viable, will discuss with TCTS re surgical options. If no viability may need to consider LVAD.   Arvilla Meres, MD 3:57 PM

## 2011-02-20 NOTE — Progress Notes (Signed)
ANTICOAGULATION CONSULT NOTE - Initial Consult  Pharmacy Consult for Heparin Indication: h/o DVT, s/p cath, bridging to CABG  Allergies  Allergen Reactions  . Latex   . Ramipril     Patient Measurements: Height: 5\' 2"  (157.5 cm) Weight: 146 lb 6.2 oz (66.4 kg) IBW/kg (Calculated) : 50.1   Vital Signs: Temp: 98 F (36.7 C) (12/11 1435) BP: 106/77 mmHg (12/11 1435) Pulse Rate: 90  (12/11 1435)  Labs:  [facility] 02/20/11 1341  HGB 12.6  HCT 37.5  PLT 218  APTT --  LABPROT 16.6*  INR 1.32  HEPARINUNFRC --  CREATININE 0.68  CKTOTAL --  CKMB --  TROPONINI --   Estimated Creatinine Clearance: 73.5 ml/min (by C-G formula based on Cr of 0.68).  Medical History: Past Medical History  Diagnosis Date  . Chronic systolic heart failure     a.  secondary to severe ischemic cardiomyopathy with EF 20-25%;  b. s/p rimplantation of Biotronik single chamber ICD by Dr Ladona Ridgel 02/2008;  c.  Right heart cath 04/2007: RA 18, PA 56/30 (40), PCWP 36. FICK 3.89/2.06 Woods;  d. echo 9/09: Ef 20-25%, mild MR, mild LAE  . CAD (coronary artery disease)     a. s/p multiple PCIs; b. cath 10/07: LAD stent 40%, dx 80% ISR - tx with POBA; OM stent ok; ostial OM 60%; RCA occluded with L-R collats  . Tobacco abuse   . Alcohol abuse   . History of noncompliance with medical treatment   . Diabetes mellitus, type 2   . Dizziness   . HLD (hyperlipidemia)   . DVT (deep venous thrombosis)     coumadin  . Arterial embolism     s/p right iliofemoral embolectomy with fasciotomy  . V-tach   . AICD (automatic cardioverter/defibrillator) present   . Angina   . Asthma   . Shortness of breath   . Sleep apnea   . CHF (congestive heart failure)   . GERD (gastroesophageal reflux disease)   . Anxiety   . Myocardial infarction   . COPD (chronic obstructive pulmonary disease)     Medications:  Scheduled:    . aspirin EC  81 mg Oral Daily  . digoxin  125 mcg Oral Daily  . furosemide  80 mg Intravenous  BID  . glimepiride  2 mg Oral Q breakfast  . pantoprazole  40 mg Oral Daily  . potassium chloride SA  20 mEq Oral BID  . simvastatin  40 mg Oral q1800  . sodium chloride  3 mL Intravenous Q12H  . spironolactone  25 mg Oral Daily  . DISCONTD: insulin aspart  0-15 Units Subcutaneous TID WC  . DISCONTD: insulin aspart  0-5 Units Subcutaneous QHS    Assessment: Jessica [name] on coumadin prior to admission for h/o DVT. S/p cath today, with 3 vessel disease to start heparin infusion for anticoagulation, plan for CABG. Sheath was out around 11am   Goal of Therapy:  Heparin level 0.3-0.7 units/ml   Plan:  1. Start heparin infusion without bolus at 1745, rate: 750 units/hr 2. F/u heparin level 6 hour after infusion start at 2345 3. F/u daily cbc and heparin level  Riki Rusk 02/20/2011,3:25 PM

## 2011-02-20 NOTE — OR Nursing (Signed)
Report called to Huntley Dec, RN on 3700.

## 2011-02-20 NOTE — OR Nursing (Signed)
Meal served 

## 2011-02-20 NOTE — OR Nursing (Signed)
Tegaderm dressing applied, site intact, level 0, no bleeding, bedrest begins at 1150.

## 2011-02-20 NOTE — H&P (View-Only) (Signed)
History of Present Illness: Primary Cardiologist:  Dr. Daniel Bensimhon  Jessica [name] is a 50 y.o. female with a history of severe congestive heart failure, secondary ischemic cardiomyopathy with an ejection fraction of 25-30%. She also has a history of coronary artery disease, status post multiple interventions and stents, diabetes, hyperlipidemia, history of ongoing tobacco and possibly alcohol use. She has been followed in the heart failure clinic and has had a problem with noncompliance.  She has inolerance of ACE-I due to severe cough.  Had stress test in 09/2009 for CP. EF: 25 %. LV markedly dilated. 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 No ischemia.  She had an ICD shock in Nov 2011.   On June 20, 2010, she had a right iliofemoral embolectomy and compartment fasciotomy of the right leg by Dr. Fields.  Also found to have LLE DVT.  Diovan, Toprol and lasix held at discharge. We saw her back in clinic and attempted to restart her meds.  Seen in ER in October with fluid overload. Got IV lasix with 6 lb diuresis and felt better.   Returns for f/u. Feels ok. Weighing every day wt  Stable 139-142 at home. (Now 40 pounds down over last year). Takes metolazone about 2x/month when weight goes up. Says breathing is fairly bad and she attributes to her COPD. Says she gets SOB going to mailbox if not less. Cut back lasix to once per day on her own.  No edema. Chronic 3-4 pillow orthopnea.  . BP still low. Not dizzy. + fatigue. Smoking several cigs/day. If tries to take her metoprolol BP gets really low. Has been off ARB and b-blocker for months due to low BP. Very occasional CP and pain down her L arm. Has been on coumadin for 6 months due to DVT.    Past Medical History  Diagnosis Date  . Chronic systolic heart failure     a.  secondary to severe ischemic cardiomyopathy with EF 20-25%;  b. s/p rimplantation of Biotronik single  chamber ICD by Dr Taylor 02/2008;  c.  Right heart cath 04/2007: RA 18, PA 56/30 (40), PCWP 36. FICK 3.89/2.06 Woods;  d. echo 9/09: Ef 20-25%, mild MR, mild LAE  . CAD (coronary artery disease)     a. s/p multiple PCIs; b. cath 10/07: LAD stent 40%, dx 80% ISR - tx with POBA; OM stent ok; ostial OM 60%; RCA occluded with L-R collats  . Tobacco abuse   . Alcohol abuse   . History of noncompliance with medical treatment   . Diabetes mellitus, type 2   . Dizziness   . HLD (hyperlipidemia)   . DVT (deep venous thrombosis)     coumadin  . Arterial embolism     s/p right iliofemoral embolectomy with fasciotomy  . V-tach   . AICD (automatic cardioverter/defibrillator) present     Current Outpatient Prescriptions  Medication Sig Dispense Refill  . furosemide (LASIX) 80 MG tablet Take 1 tablet (80 mg total) by mouth 2 (two) times daily.  60 tablet  2  . glimepiride (AMARYL) 2 MG tablet TAKE ONE TABLET BY MOUTH EVERY DAY  30 tablet  6  . glucose blood (ACCU-CHEK AVIVA) test strip Use as instructed  100 each  12  . lovastatin (MEVACOR) 40 MG tablet Take 1 tablet (40 mg total) by mouth at bedtime.  30 tablet  2  . Magnesium Oxide (MAG-OX PO) Take 40 mg   by mouth daily.       . metolazone (ZAROXOLYN) 2.5 MG tablet TAKE ONE TABLET BY MOUTH EVERY DAY AS NEEDED  30 tablet  6  . pantoprazole (PROTONIX) 40 MG tablet Take 1 tablet (40 mg total) by mouth daily.  30 tablet  2  . potassium chloride SA (K-DUR,KLOR-CON) 20 MEQ tablet Take 1 tablet (20 mEq total) by mouth 2 (two) times daily.  60 tablet  2  . spironolactone (ALDACTONE) 25 MG tablet Take 1 tablet (25 mg total) by mouth daily.  30 tablet  2  . warfarin (COUMADIN) 2 MG tablet Take as directed by Coumadin Clinic  30 tablet  0    Allergies: Allergies  Allergen Reactions  . Ramipril     Vital Signs: BP 88/58  Pulse 100  Wt 145 lb 8 oz (65.998 kg)  SpO2 94%  PHYSICAL EXAM: Well nourished, well developed, in no acute distress HEENT:  normal Neck: Carotids 2+ bilaterally. No bruits. JVP 6 Cardiac:  normal S1, S2; RRR; no murmur; no gallops Lungs:  clear to auscultation bilaterally, no wheezing, rhonchi or rales Abd: soft,  nontender, no hepatomegaly. Pustular rash over belly and arms Ext: No cyanosis or clubbing. No edema. DPs 2+ bilaterally Skin: warm and dry Neuro:  CNs 2-12 intact, no focal abnormalities noted  ECG:  SR with PVCS 91. Biatrial enlargement. No ST-T wave abnormalities.    ASSESSMENT AND PLAN:  

## 2011-02-20 NOTE — OR Nursing (Signed)
Transported via stretcher, on monitor to 3703-1

## 2011-02-21 DIAGNOSIS — I5023 Acute on chronic systolic (congestive) heart failure: Secondary | ICD-10-CM

## 2011-02-21 DIAGNOSIS — I2510 Atherosclerotic heart disease of native coronary artery without angina pectoris: Secondary | ICD-10-CM

## 2011-02-21 DIAGNOSIS — R570 Cardiogenic shock: Secondary | ICD-10-CM

## 2011-02-21 LAB — CBC
Hemoglobin: 11.7 g/dL — ABNORMAL LOW (ref 12.0–15.0)
MCHC: 33.1 g/dL (ref 30.0–36.0)
RBC: 4.3 MIL/uL (ref 3.87–5.11)
WBC: 4 10*3/uL (ref 4.0–10.5)

## 2011-02-21 LAB — LIPID PANEL
Cholesterol: 108 mg/dL (ref 0–200)
HDL: 20 mg/dL — ABNORMAL LOW (ref >39)
Total CHOL/HDL Ratio: 5.4 RATIO
Triglycerides: 62 mg/dL (ref <150)
VLDL: 12 mg/dL (ref 0–40)

## 2011-02-21 LAB — BASIC METABOLIC PANEL
GFR calc Af Amer: >90 mL/min (ref >90)
GFR calc non Af Amer: >90 mL/min (ref >90)
Potassium: 3.5 mEq/L (ref 3.5–5.1)
Sodium: 137 mEq/L (ref 135–145)

## 2011-02-21 LAB — HEPARIN LEVEL (UNFRACTIONATED)
Heparin Unfractionated: <0.1 IU/mL — ABNORMAL LOW (ref 0.30–0.70)
Heparin Unfractionated: <0.1 IU/mL — ABNORMAL LOW (ref 0.30–0.70)
Heparin Unfractionated: 0.26 IU/mL — ABNORMAL LOW (ref 0.30–0.70)
Heparin Unfractionated: 0.39 IU/mL (ref 0.30–0.70)

## 2011-02-21 LAB — SURGICAL PCR SCREEN
MRSA, PCR: NEGATIVE
Staphylococcus aureus: POSITIVE — AB

## 2011-02-21 LAB — GLUCOSE, CAPILLARY: Glucose-Capillary: 231 mg/dL — ABNORMAL HIGH (ref 70–99)

## 2011-02-21 MED ORDER — NICOTINE 21 MG/24HR TD PT24
21.0000 mg | MEDICATED_PATCH | Freq: Every day | TRANSDERMAL | Status: DC
  Filled 2011-02-21 (×3): qty 1

## 2011-02-21 MED ORDER — POTASSIUM CHLORIDE CRYS ER 20 MEQ PO TBCR
20.0000 meq | EXTENDED_RELEASE_TABLET | Freq: Once | ORAL | Status: AC
  Administered 2011-02-21: 20 meq via ORAL

## 2011-02-21 MED ORDER — HEPARIN BOLUS VIA INFUSION
2000.0000 [IU] | Freq: Once | INTRAVENOUS | Status: AC
  Administered 2011-02-21: 2000 [IU] via INTRAVENOUS
  Filled 2011-02-21: qty 2000

## 2011-02-21 MED ORDER — HYDRALAZINE HCL 25 MG PO TABS
25.0000 mg | ORAL_TABLET | Freq: Three times a day (TID) | ORAL | Status: DC
  Administered 2011-02-21 – 2011-02-23 (×5): 25 mg via ORAL
  Filled 2011-02-21 (×9): qty 1

## 2011-02-21 MED ORDER — INSULIN ASPART 100 UNIT/ML ~~LOC~~ SOLN
0.0000 [IU] | Freq: Three times a day (TID) | SUBCUTANEOUS | Status: DC
  Administered 2011-02-21 (×2): 5 [IU] via SUBCUTANEOUS
  Administered 2011-02-22: 2 [IU] via SUBCUTANEOUS
  Administered 2011-02-22: 5 [IU] via SUBCUTANEOUS
  Administered 2011-02-22: 11 [IU] via SUBCUTANEOUS
  Administered 2011-02-23 (×2): 5 [IU] via SUBCUTANEOUS
  Filled 2011-02-21: qty 3

## 2011-02-21 NOTE — Progress Notes (Signed)
Advanced Heart Failure Rounding Note  Subjective:    Admitted 12/11 post left and right heart catheterization showed severe 3 vessel CAD with occlusion of multiple stents, EF 10% with elevated filling pressures and cardiogenic shock physiology, and mixed pulmonary HTN. RA = 18, RV = 67/10/20, PA = 56/30 (41), PCW = 23, Ao = 97/72 (83), LV = 93/20/30, Fick cardiac output/index = 2.8/1.7, PVR = 6.5 Woods, SVR = 1878,  FA sat = 88%, PA sat = 42%, 46%, She was placed on IV diuresis, received 1 dose of lasix 80 mg IV.   Patient without complaints of SOB/orthopnea or PND. Wants to go smoke. Continues to itch but with some improvement.    Objective:   Vital Signs:  Temp: [97.4 F (36.3 C)-98.6 F (37 C)] 97.4 F (36.3 C) (12/12 0500)  Pulse Rate: [90-98] 94 (12/12 0500)  Resp: [16-20] 20 (12/12 0500)  BP: (98-143)/(57-88) 101/66 mmHg (12/12 0500)  SpO2: [91 %-100 %] 92 % (12/12 0500)  Weight: [65.772 kg (145 lb)-90.266 kg (199 lb)] 147 lb 7.8 oz (66.9 kg) (12/12 0500)  Last BM Date: 02/20/11  24-hour weight change:  Weight change:  Intake/Output:   Intake/Output Summary (Last 24 hours) at 02/21/11 1610 Last data filed at 02/20/11 1848   Gross per 24 hour   Intake  60 ml   Output  1100 ml   Net  -1040 ml    Physical Exam:  General: Well appearing. No resp difficulty  HEENT: normal  Neck: supple. JVP 9-10. Carotids 2+ bilat; no bruits. No lymphadenopathy or thryomegaly appreciated.  Cor: PMI nondisplaced. Regular rate & rhythm. 2/6 systolic murmur.  Lungs: clear  Abdomen: soft, nontender, nondistended. No hepatosplenomegaly. No bruits or masses. Good bowel sounds.  Extremities: no cyanosis, clubbing, rash, edema, pustular rash on belly and arms  Neuro: alert & orientedx3, cranial nerves grossly intact. moves all 4 extremities w/o difficulty. Affect pleasant   Telemetry: NSR 80-90s  Labs:  Basic Metabolic Panel:   Lab  02/21/11 0632  02/20/11 1341  02/20/11 1128  02/16/11 1317     NA  137  136  --  135   K  3.5  3.2*  --  3.9   CL  100  98  --  97   CO2  26  30  --  29   GLUCOSE  254*  312*  254*  224*   BUN  13  11  --  12   CREATININE  0.69  0.68  --  0.69   CALCIUM  8.9  9.1  --  9.7   MG  --  --  --  --   PHOS  --  --  --  --    Liver Function Tests:  No results found for this basename: AST:5,ALT:5,ALKPHOS:5,BILITOT:5,PROT:5,ALBUMIN:5 in the last 168 hours  No results found for this basename: LIPASE:5,AMYLASE:5 in the last 168 hours  No results found for this basename: AMMONIA:3 in the last 168 hours  CBC:   Lab  02/21/11 0632  02/20/11 1341  02/16/11 1317   WBC  4.0  3.9*  5.1   NEUTROABS  --  --  --   HGB  11.7*  12.6  12.6   HCT  35.3*  37.5  37.9   MCV  82.1  83.0  82.4   PLT  202  218  235    Cardiac Enzymes:  No results found for this basename: CKTOTAL:5,CKMB:5,CKMBINDEX:5,TROPONINI:5 in the last 168 hours  BNP:  No components found with this basename: POCBNP:5  CBG:   Lab  02/21/11 0748  02/20/11 2031  02/20/11 1637   GLUCAP  259*  354*  335*    Coagulation Studies:   Basename  02/21/11 1610  02/20/11 1341   LABPROT  17.1*  16.6*   INR  1.37  1.32    Other results:  Imaging:  No results found. Medications:   Scheduled Medications:   .  aspirin EC  81 mg  Oral  Daily   .  digoxin  125 mcg  Oral  Daily   .  furosemide  80 mg  Intravenous  BID   .  glimepiride  2 mg  Oral  Q breakfast   .  heparin  2,000 Units  Intravenous  Once   .  hydrALAZINE  12.5 mg  Oral  Q8H   .  insulin aspart  0-15 Units  Subcutaneous  Q4H   .  isosorbide mononitrate  30 mg  Oral  Daily   .  pantoprazole  40 mg  Oral  Daily   .  potassium chloride SA  20 mEq  Oral  BID   .  potassium chloride  20 mEq  Oral  Once   .  simvastatin  40 mg  Oral  q1800   .  sodium chloride  3 mL  Intravenous  Q12H   .  spironolactone  25 mg  Oral  Daily   .  DISCONTD: insulin aspart  0-15 Units  Subcutaneous  TID WC   .  DISCONTD: insulin aspart  0-5 Units   Subcutaneous  QHS    Infusions:   .  heparin  1,250 Units/hr (02/21/11 0801)    PRN Medications:  sodium chloride, acetaminophen, ALPRAZolam, hydrocortisone cream, hydrOXYzine, nitroGLYCERIN, ondansetron (ZOFRAN) IV, sodium chloride Assessment:   1. Cardiogenic shock  2. Acute on Chronic Systolic HF  3. ICM, EF 10%  4. 3-V CAD with multiple stent occlusions  5. DM  6. Continued tobacco abuse  Plan/Discussion:   Will continue with IV diuresis today, only received one dose of IV lasix yesterday with pretty good response although weight is unchanged. Will replete K as needed. With SVR >1800 will increase afterload reducers, hydralazine 25 mg TID. No b-blocker at this point due to low output. Previously unable to tolerate Diovan due to hypotension.  Reviewed films with Dr. Excell Seltzer and Dr. Donata Clay. She has decent bypass targets but would be very high risk for surgery. LAD stent is openable percutaneously but anterior wall and apex appear dead. Overall very few options particularly given her ongoing tobacco use which she says she doesn't want to quit yet.   Will proceed with 24-hour thallium study to assess viability. Start nicotine patch.

## 2011-02-21 NOTE — Progress Notes (Signed)
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 Ramipril Cough  . Latex Rash    Patient Measurements: Height: 5\' 2"  (157.5 cm) Weight: 146 lb 6.2 oz (66.4 kg) IBW/kg (Calculated) : 50.1   Vital Signs: Temp: 98.6 F (37 C) (12/11 2100) Temp src: Oral (12/11 2100) BP: 98/65 mmHg (12/11 2100) Pulse Rate: 92  (12/11 2100)  Labs:  Basename 02/20/11 2329 02/20/11 1341  HGB -- 12.6  HCT -- 37.5  PLT -- 218  APTT -- --  LABPROT -- 16.6*  INR -- 1.32  HEPARINUNFRC <0.10* --  CREATININE -- 0.68  CKTOTAL -- --  CKMB -- --  TROPONINI -- --   Estimated Creatinine Clearance: 73.5 ml/min (by C-G formula based on Cr of 0.68).   Medications:  Infusions:    . heparin 750 Units/hr (02/20/11 1832)    Assessment: 50 yof on coumadin PTA for hx DVT. Now s/p cath with plan for CABG. Heparin level is undetectable.   Goal of Therapy:  Heparin level 0.3-0.7 units/ml   Plan:  Increase heparin gtt to 1000units/hr Check 6 hour heparin level F/u AM CBC  Maekayla Giorgio, Drake Leach 02/21/2011,12:56 AM

## 2011-02-21 NOTE — Plan of Care (Signed)
Problem: Phase II Progression Outcomes Goal: Discharge plan established Outcome: Progressing AWAITING SCAN FOR POSSIBLE OPEN HEART SURGERY

## 2011-02-21 NOTE — Consult Note (Signed)
PCP is Pola Corn, MD Referring Provider is No ref. provider found                     [address]            [place] [address]          [phone]      No chief complaint on file.   GNF:AOZHY IV CHF with ischemic cardiomopathy,cardiogenic shock        Hx peripheral vascular disease,R leg thrombectomy also DVT of L leg        COPD active smoking         Hx of PCI with multiple stents         Poorly controlled DM Past Medical History  Diagnosis Date  . Chronic systolic heart failure     a.  secondary to severe ischemic cardiomyopathy with EF 20-25%;  b. s/p rimplantation of Biotronik single chamber ICD by Dr Ladona Ridgel 02/2008;  c.  Right heart cath 04/2007: RA 18, PA 56/30 (40), PCWP 36. FICK 3.89/2.06 Woods;  d. echo 9/09: Ef 20-25%, mild MR, mild LAE  . CAD (coronary artery disease)     a. s/p multiple PCIs; b. cath 10/07: LAD stent 40%, dx 80% ISR - tx with POBA; OM stent ok; ostial OM 60%; RCA occluded with L-R collats  . Tobacco abuse   . Alcohol abuse   . History of noncompliance with medical treatment   . Diabetes mellitus, type 2   . Dizziness   . HLD (hyperlipidemia)   . DVT (deep venous thrombosis)     coumadin  . Arterial embolism     s/p right iliofemoral embolectomy with fasciotomy  . V-tach   . AICD (automatic cardioverter/defibrillator) present   . Angina   . Asthma   . Shortness of breath   . Sleep apnea   . CHF (congestive heart failure)   . GERD (gastroesophageal reflux disease)   . Anxiety   . Myocardial infarction   . COPD (chronic obstructive pulmonary disease)     Past Surgical History  Procedure Date  . Diagnostic laparoscopy   . Cardiac catheterization   . Insert / replace / remove pacemaker   . Tubal ligation     Family History  Problem Relation Age of Onset  . Coronary artery disease      FMILY HISTORY    Social History History  Substance Use Topics  . Smoking status: Current Some Day Smoker -- 0.2 packs/day  for 21 years    Types: Cigarettes  . Smokeless tobacco: Never Used  . Alcohol Use: No    Current Facility-Administered Medications  Medication Dose Route Frequency Provider Last Rate Last Dose  . 0.9 %  sodium chloride infusion  250 mL Intravenous PRN Ok Anis, NP      . acetaminophen (TYLENOL) tablet 650 mg  650 mg Oral Q4H PRN Ok Anis, NP   650 mg at 02/21/11 1042  . ALPRAZolam Prudy Feeler) tablet 0.25 mg  0.25 mg Oral BID PRN Hadassah Pais, PA      . aspirin EC tablet 81 mg  81 mg Oral Daily Ok Anis, NP   81 mg at 02/21/11 1042  . digoxin (LANOXIN) tablet 125 mcg  125 mcg Oral Daily Ok Anis, NP   125 mcg at 02/21/11 1042  . furosemide (LASIX) injection 80 mg  80 mg Intravenous BID Ok Anis, NP  80 mg at 02/21/11 1758  . glimepiride (AMARYL) tablet 2 mg  2 mg Oral Q breakfast Ok Anis, NP   2 mg at 02/21/11 0804  . heparin ADULT infusion 100 units/ml (25000 units/250 ml)  1,350 Units/hr Intravenous Continuous Lora Poteet Seay, PHARMD 13.5 mL/hr at 02/21/11 1758 1,350 Units/hr at 02/21/11 1758  . heparin bolus via infusion 2,000 Units  2,000 Units Intravenous Once Colleen Can, PHARMD   2,000 Units at 02/21/11 0802  . hydrALAZINE (APRESOLINE) tablet 25 mg  25 mg Oral Q8H Hadassah Pais, PA   25 mg at 02/21/11 1455  . hydrocortisone cream 1 %   Topical QID PRN Hadassah Pais, PA      . hydrOXYzine (ATARAX/VISTARIL) tablet 20 mg  20 mg Oral Q6H PRN Ok Anis, NP   20 mg at 02/20/11 1612  . insulin aspart (novoLOG) injection 0-15 Units  0-15 Units Subcutaneous TID WC Hadassah Pais, PA   5 Units at 02/21/11 1758  . isosorbide mononitrate (IMDUR) 24 hr tablet 30 mg  30 mg Oral Daily Dolores Patty, MD   30 mg at 02/21/11 1042  . nicotine (NICODERM CQ - dosed in mg/24 hours) patch 21 mg  21 mg Transdermal Daily Dolores Patty, MD      . nitroGLYCERIN (NITROSTAT) SL tablet 0.4 mg  0.4 mg  Sublingual Q5 min PRN Ok Anis, NP      . ondansetron [facility]) injection 4 mg  4 mg Intravenous Q6H PRN Ok Anis, NP      . pantoprazole (PROTONIX) EC tablet 40 mg  40 mg Oral Daily Ok Anis, NP   40 mg at 02/21/11 1042  . potassium chloride SA (K-DUR,KLOR-CON) CR tablet 20 mEq  20 mEq Oral BID Ok Anis, NP   20 mEq at 02/21/11 1042  . potassium chloride SA (K-DUR,KLOR-CON) CR tablet 20 mEq  20 mEq Oral Once Hadassah Pais, PA   20 mEq at 02/21/11 1211  . simvastatin (ZOCOR) tablet 40 mg  40 mg Oral q1800 Ok Anis, NP   40 mg at 02/21/11 1758  . sodium chloride 0.9 % injection 3 mL  3 mL Intravenous Q12H Ok Anis, NP      . sodium chloride 0.9 % injection 3 mL  3 mL Intravenous PRN Ok Anis, NP      . spironolactone (ALDACTONE) tablet 25 mg  25 mg Oral Daily Ok Anis, NP   25 mg at 02/21/11 1042  . DISCONTD: hydrALAZINE (APRESOLINE) tablet 12.5 mg  12.5 mg Oral Q8H Dolores Patty, MD   12.5 mg at 02/20/11 1814  . DISCONTD: insulin aspart (novoLOG) injection 0-15 Units  0-15 Units Subcutaneous Q4H Hadassah Pais, PA   5 Units at 02/21/11 0805    Allergies  Allergen Reactions  . Ramipril Cough  . Latex Rash    Review of Systems no hx of thoracic trauma                                  Lives with friend,unemployed                                  R hand dominant  No hx malignancy   BP 94/52  Pulse 97  Temp(Src) 98.1 F (36.7 C) (Oral)  Resp 20  Ht 5\' 2"  (1.575 m)  Wt 147 lb 7.8 oz (66.9 kg)  BMI 26.98 kg/m2  SpO2 94%   Physical Exam  Chronically ill middle aged female no acute distress  HEENT poor dentition,no bruit or mass  Chest  No deformity,clear  Cor - NSR,  2/6 holosystolic murmur  Abd - non tender,no pulsatile mass  Extrem - min edema, multiple scars,non palp pedal pulses   Diagnostic Tests: Cor arteriograms reviewed,LV gram and  RHC   Impression:Severe LV dysfunction ,ICM graftable LAD,Diag,OM,RCA- not Ramus                     Strong tobacco addiction                      Question whether she would be compliant for advanced  surgical therapies (VAD,transplant)   Plan:  Agree with viability study for potential CABG            Needs 2D Echo            Will get vein mapping to asses for conduit --cath documents good LIMA                          Will follow

## 2011-02-21 NOTE — Progress Notes (Signed)
Inpatient Diabetes Program Recommendations  AACE/ADA: New Consensus Statement on Inpatient Glycemic Control (2009)  Target Ranges:  Prepandial:   less than 140 mg/dL      Peak postprandial:   less than 180 mg/dL (1-2 hours)      Critically ill patients:  140 - 180 mg/dL   Reason for Visit: Hyperglycemia in the mid 200's  Inpatient Diabetes Program Recommendations Insulin - Basal: Please add Lantus 10 units to start while here.    HgbA1C:  Please check.  Last documented was on 06/20/10 as 10%  Note: Lenor Coffin, RN, CNS, Diabetes Coordinator

## 2011-02-21 NOTE — Progress Notes (Signed)
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 Ramipril Cough  . Latex Rash    Patient Measurements: Height: 5\' 2"  (157.5 cm) Weight: 147 lb 7.8 oz (66.9 kg) IBW/kg (Calculated) : 50.1   Vital Signs: Temp: 97.4 F (36.3 C) (12/12 0500) Temp src: Oral (12/12 0500) BP: 101/66 mmHg (12/12 0500) Pulse Rate: 94  (12/12 0500)  Labs:  Basename 02/21/11 8657 02/20/11 2329 02/20/11 1341  HGB 11.7* -- 12.6  HCT 35.3* -- 37.5  PLT 202 -- 218  APTT -- -- --  LABPROT 17.1* -- 16.6*  INR 1.37 -- 1.32  HEPARINUNFRC <0.10* <0.10* --  CREATININE -- -- 0.68  CKTOTAL -- -- --  CKMB -- -- --  TROPONINI -- -- --   Estimated Creatinine Clearance: 73.8 ml/min (by C-G formula based on Cr of 0.68).   Medications:  Infusions:     . heparin 750 Units/hr (02/20/11 1832)    Assessment: 50yo female remains undetectable on heparin after rate increase, awaiting CABG.   Goal of Therapy:  Heparin level 0.3-0.7 units/ml   Plan:  Since cath almost 24h ago will give small bolus of 2000 units and increase gtt by 4 units/kg/hr to 1250 units/hr and check level in 6hr.  Colleen Can PharmD BCPS 02/21/2011,7:47 AM

## 2011-02-21 NOTE — Progress Notes (Signed)
Pt states she smokes about 4 cigarettes per day. Has cut down from 1 ppd to the current amount. Pt not interested in quitting at this time. States she wants a cigarette now. Offered the nicotine patch but pt declined. Gave pt education/contact information as she stated she would be interested in it. Pt in contemplation stage.

## 2011-02-21 NOTE — Progress Notes (Signed)
ANTICOAGULATION CONSULT NOTE - Follow Up Consult  Pharmacy Consult for heparin Indication: h/o DVT, s/p cath bridging to ?CABG  Allergies  Allergen Reactions  . Ramipril Cough  . Latex Rash    Patient Measurements: Height: 5\' 2"  (157.5 cm) Weight: 147 lb 7.8 oz (66.9 kg) IBW/kg (Calculated) : 50.1   Vital Signs: Temp: 98.1 F (36.7 C) (12/12 1401) Temp src: Oral (12/12 1401) BP: 90/60 mmHg (12/12 1401) Pulse Rate: 97  (12/12 1401)  Labs:  Basename 02/21/11 1333 02/21/11 0632 02/20/11 2329 02/20/11 1341  HGB -- 11.7* -- 12.6  HCT -- 35.3* -- 37.5  PLT -- 202 -- 218  APTT -- -- -- --  LABPROT -- 17.1* -- 16.6*  INR -- 1.37 -- 1.32  HEPARINUNFRC 0.26* <0.10* <0.10* --  CREATININE -- 0.69 -- 0.68  CKTOTAL -- -- -- --  CKMB -- -- -- --  TROPONINI -- -- -- --   Estimated Creatinine Clearance: 73.8 ml/min (by C-G formula based on Cr of 0.69).   Medications:  Infusions:     . heparin 1,250 Units/hr (02/21/11 0801)    Assessment: Heparin level 0.26 units/hr  Goal of Therapy:  Heparin level 0.3-0.7 units/ml   Plan:  Increase heparin to 1350 units/hr.  Recheck level in ~8hours.  Woodfin Ganja PharmD 02/21/2011,2:55 PM

## 2011-02-21 NOTE — Progress Notes (Signed)
UR Completed.   Jessica [name] 02/21/2011 [phone]

## 2011-02-22 ENCOUNTER — Inpatient Hospital Stay (HOSPITAL_COMMUNITY): Payer: Medicare Other

## 2011-02-22 ENCOUNTER — Ambulatory Visit (INDEPENDENT_AMBULATORY_CARE_PROVIDER_SITE_OTHER): Payer: Medicare Other | Admitting: *Deleted

## 2011-02-22 ENCOUNTER — Other Ambulatory Visit: Payer: Self-pay

## 2011-02-22 DIAGNOSIS — I2510 Atherosclerotic heart disease of native coronary artery without angina pectoris: Secondary | ICD-10-CM

## 2011-02-22 DIAGNOSIS — Z01810 Encounter for preprocedural cardiovascular examination: Secondary | ICD-10-CM

## 2011-02-22 DIAGNOSIS — I5022 Chronic systolic (congestive) heart failure: Secondary | ICD-10-CM

## 2011-02-22 DIAGNOSIS — I369 Nonrheumatic tricuspid valve disorder, unspecified: Secondary | ICD-10-CM

## 2011-02-22 DIAGNOSIS — I472 Ventricular tachycardia: Secondary | ICD-10-CM

## 2011-02-22 LAB — GLUCOSE, CAPILLARY
Glucose-Capillary: 131 mg/dL — ABNORMAL HIGH (ref 70–99)
Glucose-Capillary: 327 mg/dL — ABNORMAL HIGH (ref 70–99)
Glucose-Capillary: 92 mg/dL (ref 70–99)

## 2011-02-22 LAB — BASIC METABOLIC PANEL
BUN: 14 mg/dL (ref 6–23)
CO2: 25 mEq/L (ref 19–32)
Calcium: 8.8 mg/dL (ref 8.4–10.5)
Chloride: 100 mEq/L (ref 96–112)
Creatinine, Ser: 0.74 mg/dL (ref 0.50–1.10)

## 2011-02-22 LAB — CBC
HCT: 36.8 % (ref 36.0–46.0)
MCHC: 33.4 g/dL (ref 30.0–36.0)
MCV: 82.3 fL (ref 78.0–100.0)
Platelets: 234 10*3/uL (ref 150–400)
RDW: 15.6 % — ABNORMAL HIGH (ref 11.5–15.5)

## 2011-02-22 NOTE — Progress Notes (Signed)
CSW met with patient but the patient was feeling faint and asked the CSW to return. The CSW will return to complete a comprehensive psychosocial.

## 2011-02-22 NOTE — Progress Notes (Signed)
*  PRELIMINARY RESULTS*  Lower Extremity Vein Mapping has been performed.  Jessica [name] 02/22/2011, 12:57 PM

## 2011-02-22 NOTE — Progress Notes (Signed)
*  PRELIMINARY RESULTS*  Pre CABG Dopplers have been performed. Carotid Doppler= Bilateral:  No evidence of hemodynamically significant internal carotid artery stenosis.  Vertebral artery flow is antegrade.   Palmar Arch evaluation= Right waveform decreases >50% with both radial and ulnar compression. Left waveform remains normal with radial compression and obliterates with ulnar compression. Right ABI = 0.98 and Left ABI = 1.13.    Farrel Demark 02/22/2011, 12:58 PM

## 2011-02-22 NOTE — Progress Notes (Signed)
ANTICOAGULATION CONSULT NOTE - Follow Up Consult  Pharmacy Consult for heparin Indication: h/o DVT, ACS  Allergies  Allergen Reactions  . Ramipril Cough  . Latex Rash    Patient Measurements: Height: 5\' 2"  (157.5 cm) Weight: 147 lb 7.8 oz (66.9 kg) IBW/kg (Calculated) : 50.1   Vital Signs: Temp: 98.1 F (36.7 C) (12/12 1401) Temp src: Oral (12/12 1401) BP: 94/52 mmHg (12/12 1745) Pulse Rate: 97  (12/12 1401)  Labs:  Basename 02/21/11 2237 02/21/11 1333 02/21/11 0632 02/20/11 1341  HGB -- -- 11.7* 12.6  HCT -- -- 35.3* 37.5  PLT -- -- 202 218  APTT -- -- -- --  LABPROT -- -- 17.1* 16.6*  INR -- -- 1.37 1.32  HEPARINUNFRC 0.39 0.26* <0.10* --  CREATININE -- -- 0.69 0.68  CKTOTAL -- -- -- --  CKMB -- -- -- --  TROPONINI -- -- -- --   Estimated Creatinine Clearance: 73.8 ml/min (by C-G formula based on Cr of 0.69).  Assessment: 50yo female now therapeutic on heparin while Coumadin on hold awaiting surgical decisions.  Goal of Therapy:  Heparin level 0.3-0.7 units/ml   Plan:  Will continue gtt at current rate and confirm stable with am labs.  Colleen Can PharmD BCPS 02/22/2011,1:59 AM

## 2011-02-22 NOTE — Progress Notes (Signed)
Pt requesting supplemental O2 for feeling of SOB while coughing, and feeling cold. O2 SATs were assessed at 83% on RA. With repeated coughing and deep breathings SATs on RA increased to 93% and sustained. Pt was sitting in the bed the entire time. O2 placed for comfort at 2L.

## 2011-02-22 NOTE — Progress Notes (Signed)
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 RA = 18, RV = 67/10/20, PA = 56/30 (41), PCW = 23, Ao = 97/72 (83), LV = 93/20/30, Fick cardiac output/index = 2.8/1.7, PVR = 6.5 Woods, SVR = 1878, FA sat = 88%, PA sat = 42%, 46%, She was placed on IV diuresis.     Appreciate Dr. Zenaida Niece Trigt's consult. Awaiting viability study today.      Weight unchanged but -2.5L since admit.  Afterload reducer, hydralazine being titrated with initial SVR > 1800 she has tolerated this adjustment.  She is extremely anxious to go outside and smoke.  She currently does not want to use a nicotine patch.        Objective:       Vital Signs:                  Temp:  [98.1 F (36.7 C)-98.7 F (37.1 C)] 98.7 F (37.1 C) (12/13 0500) Pulse Rate:  [86-97] 91  (12/13 0500) Resp:  [20] 20  (12/13 0500) BP: (90-100)/(52-75) 100/75 mmHg (12/13 0500) SpO2:  [94 %-95 %] 95 % (12/13 0500) Weight:  [66.83 kg (147 lb 5.3 oz)] 147 lb 5.3 oz (66.83 kg) (12/13 0500) Last BM Date: 02/21/11   24-hour weight change: Weight change: 0.43 kg (15.2 oz)   Intake/Output:            Intake/Output Summary (Last 24 hours) at 02/22/11 0847 Last data filed at 02/22/11 0300   Gross per 24 hour   Intake   830.2 ml   Output    2300 ml   Net  -1469.8 ml                   Physical Exam: General: Well appearing. No resp difficulty   HEENT: normal   Neck: supple. JVP 8-9. Carotids 2+ bilat; no bruits. No lymphadenopathy or thryomegaly appreciated.   Cor: PMI nondisplaced. Regular rate & rhythm. 2/6 systolic murmur.   Lungs: clear   Abdomen: soft, nontender, nondistended. No hepatosplenomegaly. No bruits or masses. Good bowel sounds.   Extremities: no cyanosis, clubbing, rash, edema, pustular rash  on belly and arms   Neuro: alert & orientedx3, cranial nerves grossly intact. moves all 4 extremities w/o difficulty. Affect pleasant     Telemetry: NSR 80-90s       Labs: Basic Metabolic Panel: Lab  02/22/11 9604  02/21/11 0632  02/20/11 1341  02/20/11 1128  02/16/11 1317   NA  137  137  136  --  135   K  3.8  3.5  3.2*  --  3.9   CL  100  100  98  --  97   CO2  25  26  30   --  29   GLUCOSE  221*  254*  312*  254*  224*   BUN  14  13  11   --  12   CREATININE  0.74  0.69  0.68  --  0.69   CALCIUM  8.8  8.9  9.1  --  --   MG  --  --  --  --  --   PHOS  --  --  --  --  --      Liver Function  Tests: No results found for this basename: AST:5,ALT:5,ALKPHOS:5,BILITOT:5,PROT:5,ALBUMIN:5 in the last 168 hours No results found for this basename: LIPASE:5,AMYLASE:5 in the last 168 hours No results found for this basename: AMMONIA:3 in the last 168 hours   CBC: Lab  02/22/11 0550  02/21/11 0632  02/20/11 1341  02/16/11 1317   WBC  3.8*  4.0  3.9*  5.1   NEUTROABS  --  --  --  --   HGB  12.3  11.7*  12.6  12.6   HCT  36.8  35.3*  37.5  37.9   MCV  82.3  82.1  83.0  82.4   PLT  234  202  218  235      Cardiac Enzymes: No results found for this basename: CKTOTAL:5,CKMB:5,CKMBINDEX:5,TROPONINI:5 in the last 168 hours   BNP: No components found with this basename: POCBNP:5   CBG: Lab  02/22/11 0727  02/21/11 2040  02/21/11 1635  02/21/11 1143  02/21/11 0748   GLUCAP  201*  216*  231*  224*  259*      Coagulation Studies: Basename  02/21/11 0632  02/20/11 1341   LABPROT  17.1*  16.6*   INR  1.37  1.32      Other results:    Imaging: No results found.      Medications:         Scheduled Medications:     .  aspirin EC   81 mg  Oral  Daily   .  digoxin   125 mcg  Oral  Daily   .  furosemide   80 mg  Intravenous  BID   .  glimepiride   2 mg  Oral  Q breakfast   .  hydrALAZINE   25 mg  Oral  Q8H   .  insulin aspart   0-15 Units  Subcutaneous  TID WC     .  isosorbide mononitrate   30 mg  Oral  Daily   .  nicotine   21 mg  Transdermal  Daily   .  pantoprazole   40 mg  Oral  Daily   .  potassium chloride SA   20 mEq  Oral  BID   .  potassium chloride   20 mEq  Oral  Once   .  simvastatin   40 mg  Oral  q1800   .  sodium chloride   3 mL  Intravenous  Q12H   .  spironolactone   25 mg  Oral  Daily   .  DISCONTD: insulin aspart   0-15 Units  Subcutaneous  Q4H     Infusions:     .  heparin  1,350 Units/hr (02/21/11 1758)     PRN Medications: sodium chloride, acetaminophen, ALPRAZolam, hydrocortisone cream, hydrOXYzine, nitroGLYCERIN, ondansetron (ZOFRAN) IV, sodium chloride      Assessment:    1. Cardiogenic shock   2. Acute on Chronic Systolic HF   3. ICM, EF 10%   4. 3-V CAD with multiple stent occlusions   5. DM   6. Continued tobacco abuse   7. History of DVT, April 2012    Plan/Discussion:      Will continue with IV diuresis today with plans to transition to oral diuretics tomorrow as urine output showed pretty good response.  Will continue to titrate afterload  Reducers, hydralazine 37.5 mg TID.  Discuss restarting b-blocker in 24 hours.    Appreciate Dr. Zenaida Niece Trigt's recommendation.  Will await 2D echo, viability  study and vein mapping for potential CABG.  Suspect she may need advanced therapies though tobacco addiction presents a problem.   Length of Stay: 2  Truman Hayward 9:33 AM

## 2011-02-22 NOTE — Progress Notes (Signed)
  Echocardiogram 2D Echocardiogram has been performed.  Jessica [name] 02/22/2011, 1:29 PM

## 2011-02-23 ENCOUNTER — Other Ambulatory Visit: Payer: Self-pay | Admitting: Internal Medicine

## 2011-02-23 ENCOUNTER — Inpatient Hospital Stay (HOSPITAL_COMMUNITY): Payer: Medicare Other

## 2011-02-23 ENCOUNTER — Encounter: Payer: Self-pay | Admitting: Internal Medicine

## 2011-02-23 DIAGNOSIS — R570 Cardiogenic shock: Secondary | ICD-10-CM

## 2011-02-23 LAB — GLUCOSE, CAPILLARY

## 2011-02-23 LAB — HEPARIN LEVEL (UNFRACTIONATED): Heparin Unfractionated: 0.52 IU/mL (ref 0.30–0.70)

## 2011-02-23 LAB — CBC
MCV: 82.5 fL (ref 78.0–100.0)
Platelets: 244 10*3/uL (ref 150–400)
RBC: 4.86 MIL/uL (ref 3.87–5.11)
RDW: 15.4 % (ref 11.5–15.5)
WBC: 4.7 10*3/uL (ref 4.0–10.5)

## 2011-02-23 LAB — BASIC METABOLIC PANEL
Calcium: 9.2 mg/dL (ref 8.4–10.5)
Creatinine, Ser: 0.67 mg/dL (ref 0.50–1.10)
GFR calc Af Amer: >90 mL/min (ref >90)
GFR calc non Af Amer: >90 mL/min (ref >90)
Sodium: 136 mEq/L (ref 135–145)

## 2011-02-23 LAB — DIGOXIN LEVEL: Digoxin Level: 0.4 ng/mL — ABNORMAL LOW (ref 0.8–2.0)

## 2011-02-23 MED ORDER — THALLOUS CHLORIDE TL 201 1 MCI/ML IV SOLN
5.0000 | Freq: Once | INTRAVENOUS | Status: AC | PRN
  Administered 2011-02-23: 5 via INTRAVENOUS

## 2011-02-23 MED ORDER — NITROGLYCERIN 0.4 MG SL SUBL: 0.4000 mg | SUBLINGUAL_TABLET | SUBLINGUAL | Status: DC | PRN

## 2011-02-23 MED ORDER — HYDRALAZINE HCL 25 MG PO TABS
37.5000 mg | ORAL_TABLET | Freq: Three times a day (TID) | ORAL | Status: DC
  Administered 2011-02-23: 37.5 mg via ORAL
  Filled 2011-02-23 (×3): qty 1.5

## 2011-02-23 MED ORDER — ASPIRIN 81 MG PO TBEC: 325.0000 mg | DELAYED_RELEASE_TABLET | Freq: Every day | ORAL | Status: DC

## 2011-02-23 MED ORDER — HYDRALAZINE HCL 25 MG PO TABS: 25.0000 mg | ORAL_TABLET | Freq: Three times a day (TID) | ORAL | Status: DC

## 2011-02-23 MED ORDER — THALLOUS CHLORIDE TL 201 1 MCI/ML IV SOLN
5.0000 | Freq: Once | INTRAVENOUS | Status: AC | PRN
  Administered 2011-02-22: 5 via INTRAVENOUS

## 2011-02-23 MED ORDER — ISOSORBIDE MONONITRATE ER 30 MG PO TB24: 30.0000 mg | ORAL_TABLET | Freq: Every day | ORAL | Status: DC

## 2011-02-23 MED ORDER — ASPIRIN 81 MG PO TBEC: 81.0000 mg | DELAYED_RELEASE_TABLET | Freq: Every day | ORAL | Status: DC

## 2011-02-23 MED ORDER — FUROSEMIDE 80 MG PO TABS
80.0000 mg | ORAL_TABLET | Freq: Two times a day (BID) | ORAL | Status: DC
  Filled 2011-02-23 (×2): qty 1

## 2011-02-23 NOTE — Progress Notes (Signed)
Pt has stated requested and stated several times that she would like a cigarette and will leave the unit to go smoke. Pt does have scheduled nicotine patch which she has refused with repeated encouragement. She has also been educated on the no smoking policy of the hospital.

## 2011-02-23 NOTE — Progress Notes (Signed)
Inpatient Diabetes Program Recommendations  AACE/ADA: New Consensus Statement on Inpatient Glycemic Control (2009)  Target Ranges:  Prepandial:   less than 140 mg/dL      Peak postprandial:   less than 180 mg/dL (1-2 hours)      Critically ill patients:  140 - 180 mg/dL   Reason: Hyperglycemia  Inpatient Diabetes Program Recommendations Insulin - Basal: Please add Lantus 10 units to start while here.    HgbA1C:  Please check.  Last documented was on 06/20/10 as 10%

## 2011-02-23 NOTE — Progress Notes (Signed)
Advanced Heart Failure Rounding Note  Subjective:   Chamara is a 50 y.o. female with a history of severe congestive heart failure, secondary ischemic cardiomyopathy with an ejection fraction of 20%. She also has a history of coronary artery disease, status post multiple interventions and stents, diabetes, hyperlipidemia, and ongoing tobacco use.  Admitted 12/11 post left and right heart catheterization showed severe 3 vessel CAD with occlusion of multiple stents, EF 10% with elevated filling pressures and cardiogenic shock physiology, and mixed pulmonary HTN. RA = 18, RV = 67/10/20, PA = 56/30 (41), PCW = 23, Ao = 97/72 (83), LV = 93/20/30, Fick cardiac output/index = 2.8/1.7, PVR = 6.5 Woods, SVR = 1878, FA sat = 88%, PA sat = 42%, 46%, She was placed on IV diuresis.  Echo 12/13: Only basal function preserved. EF 20-25%.  Viability Study Pending for possible CABG, Dr. Donata Clay is following.  Weight down 4 kg. Afterload reducer, hydralazine being titrated with initial SVR > 1800 she has tolerated this adjustment. She is extremely anxious to go outside and smoke. She currently does not want to use a nicotine patch. No complaints of SOB/orthopnea or PND. Still with itching.  Objective:   Vital Signs:  Temp: [98 F (36.7 C)-98.3 F (36.8 C)] 98.3 F (36.8 C) (12/14 0500)  Pulse Rate: [88-98] 92 (12/14 0500)  Resp: [19-20] 19 (12/14 0500)  BP: (92-103)/(65-70) 103/70 mmHg (12/14 0500)  SpO2: [96 %-98 %] 98 % (12/14 0500)  Weight: [62.324 kg (137 lb 6.4 oz)] 137 lb 6.4 oz (62.324 kg) (12/14 0500)  Last BM Date: 02/23/11  24-hour weight change:  Weight change: -4.506 kg (-9 lb 14.9 oz)  Intake/Output:   Intake/Output Summary (Last 24 hours) at 02/23/11 0916 Last data filed at 02/23/11 0900   Gross per 24 hour   Intake  376.5 ml   Output  3961 ml   Net  -3584.5 ml    Physical Exam:  General: Well appearing. No resp difficulty  HEENT: normal  Neck: supple. JVP 8-9. Carotids 2+ bilat; no  bruits. No lymphadenopathy or thryomegaly appreciated.  Cor: PMI nondisplaced. Regular rate & rhythm. 2/6 systolic murmur  Lungs: clear  Abdomen: soft, nontender, nondistended. No hepatosplenomegaly. No bruits or masses. Good bowel sounds.  Extremities: no cyanosis, clubbing, rash, edema, pustular rash on belly and extremities.  Neuro: alert & orientedx3, cranial nerves grossly intact. moves all 4 extremities w/o difficulty. Affect pleasant  Telemetry: SR 90s  Labs:  Basic Metabolic Panel:   Lab  02/23/11 0643  02/22/11 0550  02/21/11 0632  02/20/11 1341  02/20/11 1128  02/16/11 1317   NA  136  137  137  136  --  135   K  3.9  3.8  3.5  3.2*  --  3.9   CL  95*  100  100  98  --  97   CO2  31  25  26  30   --  29   GLUCOSE  202*  221*  254*  312*  254*  --   BUN  16  14  13  11   --  12   CREATININE  0.67  0.74  0.69  0.68  --  0.69   CALCIUM  9.2  8.8  8.9  --  --  --   MG  --  --  --  --  --  --   PHOS  --  --  --  --  --  --  Liver Function Tests:  No results found for this basename: AST:5,ALT:5,ALKPHOS:5,BILITOT:5,PROT:5,ALBUMIN:5 in the last 168 hours  No results found for this basename: LIPASE:5,AMYLASE:5 in the last 168 hours  No results found for this basename: AMMONIA:3 in the last 168 hours  CBC:   Lab  02/23/11 0643  02/22/11 0550  02/21/11 0632  02/20/11 1341  02/16/11 1317   WBC  4.7  3.8*  4.0  3.9*  5.1   NEUTROABS  --  --  --  --  --   HGB  13.5  12.3  11.7*  12.6  12.6   HCT  40.1  36.8  35.3*  37.5  37.9   MCV  82.5  82.3  82.1  83.0  82.4   PLT  244  234  202  218  235    Cardiac Enzymes:  No results found for this basename: CKTOTAL:5,CKMB:5,CKMBINDEX:5,TROPONINI:5 in the last 168 hours  BNP:  No components found with this basename: POCBNP:5  CBG:   Lab  02/23/11 0730  02/22/11 2042  02/22/11 1651  02/22/11 1414  02/22/11 0727   GLUCAP  207*  92  327*  131*  201*    Coagulation Studies:   Basename  02/21/11 0632  02/20/11 1341   LABPROT  17.1*  16.6*    INR  1.37  1.32    Other results:  Imaging:  No results found. Medications:   Scheduled Medications:   .  aspirin EC  81 mg  Oral  Daily   .  digoxin  125 mcg  Oral  Daily   .  furosemide  80 mg  Intravenous  BID   .  glimepiride  2 mg  Oral  Q breakfast   .  hydrALAZINE  25 mg  Oral  Q8H   .  insulin aspart  0-15 Units  Subcutaneous  TID WC   .  isosorbide mononitrate  30 mg  Oral  Daily   .  nicotine  21 mg  Transdermal  Daily   .  pantoprazole  40 mg  Oral  Daily   .  potassium chloride SA  20 mEq  Oral  BID   .  simvastatin  40 mg  Oral  q1800   .  sodium chloride  3 mL  Intravenous  Q12H   .  spironolactone  25 mg  Oral  Daily    Infusions:   .  heparin  1,350 Units/hr (02/22/11 1800)    PRN Medications:  sodium chloride, acetaminophen, ALPRAZolam, hydrocortisone cream, hydrOXYzine, nitroGLYCERIN, ondansetron (ZOFRAN) IV, sodium chloride Assessment:   1. Cardiogenic shock  2. Acute on Chronic Systolic HF  3. ICM, EF 10%  4. 3-V CAD with multiple stent occlusions  5. DM  6. Continued tobacco abuse  7. History of DVT, April 2012  Plan/Discussion:   Patient with good response to lasix yesterday. Feels better. I reviewed initial thallium images and she appears to have a decent amount of viability on resting images although uptake seems a bit worse on 24 hour images (not sure why).   Had long talk about options including high-risk CABG, PCI of LAD, LVAD or medical therapy. She is not interested in LVAD at this time. I feel CABG is probably best option if she is suitable candidate. Will review with Dr. Donata Clay. Long talk about need for smoking cessation. (again)  Will d/c home today on current regiment. Continue hydral/ntg. Unable to tolerate b-blocker or ARB due hypotension.  See d/c summary for further details.   Daniel Bensimhon,MD 2:14 PM

## 2011-02-23 NOTE — Progress Notes (Signed)
ANTICOAGULATION CONSULT NOTE - Follow Up Consult  Pharmacy Consult for Heparin Indication: Hx DVT; s/p cath bridging to CABG  Allergies  Allergen Reactions  . Ramipril Cough  . Latex Rash    Patient Measurements: Height: 5\' 2"  (157.5 cm) Weight: 137 lb 6.4 oz (62.324 kg) IBW/kg (Calculated) : 50.1  Adjusted Body Weight:   Vital Signs: Temp: 98.3 F (36.8 C) (12/14 0500) Temp src: Oral (12/14 0500) BP: 94/62 mmHg (12/14 0931) Pulse Rate: 94  (12/14 0931)  Labs:  Basename 02/23/11 0643 02/22/11 0550 02/21/11 2237 02/21/11 0632 02/20/11 1341  HGB 13.5 12.3 -- -- --  HCT 40.1 36.8 -- 35.3* --  PLT 244 234 -- 202 --  APTT -- -- -- -- --  LABPROT -- -- -- 17.1* 16.6*  INR -- -- -- 1.37 1.32  HEPARINUNFRC 0.52 0.45 0.39 -- --  CREATININE 0.67 0.74 -- 0.69 --  CKTOTAL -- -- -- -- --  CKMB -- -- -- -- --  TROPONINI -- -- -- -- --   Estimated Creatinine Clearance: 71.4 ml/min (by C-G formula based on Cr of 0.67).   Medications:  Heparin 1350 units/hr  Assessment: 50yof on Heparin for h/o DVT and 3 vessel CAD bridging to possible CABG. Heparin level (0.52) continues to be therapeutic.  - H/H and Plts stable - No bleeding reported  Goal of Therapy:  Heparin level 0.3-0.7 units/ml   Plan:  1. Continue Heparin 1350 units/hr (13.5 ml/hr) 2. Follow-up Cardiology/TCTS recommendations/plans and AM heparin level  Jessica [name] [phone] 02/23/2011,11:25 AM

## 2011-02-23 NOTE — Discharge Summary (Signed)
Patient ID: Jessica [name] MRN: [id number] DOB/AGE: 52-Mar-1962 52 y.o.  Admit date: 02/20/2011 Discharge date: 02/23/2011  Primary Discharge Diagnosis 1. Cardiogenic shock  2. Acute on Chronic Systolic HF  3. ICM, EF 10%  4. 3-V CAD with multiple stent occlusions  5. DM  6. Continued tobacco abuse  7. History of DVT, April 2012    Hospital Course:  Jessica [name] is a 50 y.o. female with a history of severe congestive heart failure, secondary ischemic cardiomyopathy with an ejection fraction of 20%. She also has a history of coronary artery disease, status post multiple interventions and stents, diabetes, hyperlipidemia, and ongoing tobacco use.   Recently seen in clinic with CP and low-output HF symptoms. Unable to tolerate b-blocker and ARB as outpatient due to low BP. Underwent outpatient cath 12/11:  showed severe 3 vessel CAD with occlusion of multiple stents, EF 10-15% with elevated filling pressures and cardiogenic shock physiology, and mixed pulmonary HTN.  RA = 18  RV = 67/10/20, PA = 56/30 (41)  PCW = 23,  Ao = 97/72 (83) LV = 93/20/30 Fick cardiac output/index = 2.8/1.7 PVR = 6.5 Woods SVR = 1878, FA sat = 88%, PA sat = 42%, 46%,  Admitted for IV diuresis and viability assessment for possible CABG. Diuresed well with IV lasix with symptomatic improvement. Able to add on hydralazine and nitrates. Diuresed from 146 to 137 pounds. While in hospital repeatedly asking to smoke.   Echo 12/13: Only basal function preserved. EF 20-25%. Seen by Dr. Donata Clay who was awaiting results of viability study to decide on high-risk CABG. I reviewed initial viability images and she appears to have a decent amount of viability on resting images although uptake seems a bit worse on 24 hour images (not sure why). Will await final report.   On Dec 14th felt better and wanted to go home. Had long talk about options including high-risk CABG, PCI of LAD, LVAD or medical therapy. She is not  interested in LVAD at this time. I feel CABG is probably best option if she is suitable candidate. Will review with Dr. Donata Clay.   Extensive HF teaching provided prior to D/C as well as smoking cessation counseling.[name] She will f/u in HF clinic next week. On d/c coumadin stopped due to poor compliance with f/u and fact that her DVT was 8 months ago.      Discharge Info: Blood pressure 101/64, pulse 89, temperature 98.2 F (36.8 C), temperature source Oral, resp. rate 14, height 5\' 2"  (1.575 m), weight 62.324 kg (137 lb 6.4 oz), SpO2 95.00%.    Weight change: -4.506 kg (-9 lb 14.9 oz) Results for orders placed during the hospital encounter of 02/20/11 (from the past 24 hour(s))  GLUCOSE, CAPILLARY     Status: Abnormal   Collection Time   02/22/11  4:51 PM      Component Value Range   Glucose-Capillary 327 (*) 70 - 99 (mg/dL)   Comment 1 Notify RN    GLUCOSE, CAPILLARY     Status: Normal   Collection Time   02/22/11  8:42 PM      Component Value Range   Glucose-Capillary 92  70 - 99 (mg/dL)  BASIC METABOLIC PANEL     Status: Abnormal   Collection Time   02/23/11  6:43 AM      Component Value Range   Sodium 136  135 - 145 (mEq/L)   Potassium 3.9  3.5 - 5.1 (mEq/L)   Chloride 95 (*) 96 -  112 (mEq/L)   CO2 31  19 - 32 (mEq/L)   Glucose, Bld 202 (*) 70 - 99 (mg/dL)   BUN 16  6 - 23 (mg/dL)   Creatinine, Ser 4.03  0.50 - 1.10 (mg/dL)   Calcium 9.2  8.4 - 47.4 (mg/dL)   GFR calc non Af Amer >90  >90 (mL/min)   GFR calc Af Amer >90  >90 (mL/min)  CBC     Status: Normal   Collection Time   02/23/11  6:43 AM      Component Value Range   WBC 4.7  4.0 - 10.5 (K/uL)   RBC 4.86  3.87 - 5.11 (MIL/uL)   Hemoglobin 13.5  12.0 - 15.0 (g/dL)   HCT 25.9  56.3 - 87.5 (%)   MCV 82.5  78.0 - 100.0 (fL)   MCH 27.8  26.0 - 34.0 (pg)   MCHC 33.7  30.0 - 36.0 (g/dL)   RDW 64.3  32.9 - 51.8 (%)   Platelets 244  150 - 400 (K/uL)  HEPARIN LEVEL (UNFRACTIONATED)     Status: Normal   Collection  Time   02/23/11  6:43 AM      Component Value Range   Heparin Unfractionated 0.52  0.30 - 0.70 (IU/mL)  DIGOXIN LEVEL     Status: Abnormal   Collection Time   02/23/11  6:43 AM      Component Value Range   Digoxin Level 0.4 (*) 0.8 - 2.0 (ng/mL)  GLUCOSE, CAPILLARY     Status: Abnormal   Collection Time   02/23/11  7:30 AM      Component Value Range   Glucose-Capillary 207 (*) 70 - 99 (mg/dL)   Comment 1 Notify RN    GLUCOSE, CAPILLARY     Status: Abnormal   Collection Time   02/23/11 11:34 AM      Component Value Range   Glucose-Capillary 217 (*) 70 - 99 (mg/dL)   Comment 1 Notify RN       Discharge Medications: Current Discharge Medication List    START taking these medications   Details  aspirin 81 MG EC tablet Take 4 tablets (325 mg total) by mouth daily. Qty: 30 tablet    hydrALAZINE (APRESOLINE) 25 MG tablet Take 1 tablet (25 mg total) by mouth 3 (three) times daily. Qty: 90 tablet, Refills: 6    isosorbide mononitrate (IMDUR) 30 MG 24 hr tablet Take 1 tablet (30 mg total) by mouth daily. Qty: 30 tablet, Refills: 6    nitroGLYCERIN (NITROSTAT) 0.4 MG SL tablet Place 1 tablet (0.4 mg total) under the tongue every 5 (five) minutes as needed for chest pain. Qty: 25 tablet, Refills: 3      CONTINUE these medications which have NOT CHANGED   Details  digoxin (LANOXIN) 0.125 MG tablet Take 1 tablet (125 mcg total) by mouth daily. Qty: 30 tablet, Refills: 6    furosemide (LASIX) 80 MG tablet Take 1 tablet (80 mg total) by mouth 2 (two) times daily. Qty: 60 tablet, Refills: 2   Associated Diagnoses: Acute on chronic systolic heart failure    glimepiride (AMARYL) 2 MG tablet TAKE ONE TABLET BY MOUTH EVERY DAY Qty: 30 tablet, Refills: 6    hydrOXYzine (ATARAX/VISTARIL) 10 MG tablet Take 10 mg by mouth 2 (two) times daily as needed. For itching     lovastatin (MEVACOR) 40 MG tablet Take 1 tablet (40 mg total) by mouth at bedtime. Qty: 30 tablet, Refills: 2    Associated Diagnoses:  Other and unspecified hyperlipidemia    magnesium oxide (MAG-OX) 400 MG tablet Take 400 mg by mouth daily.      metolazone (ZAROXOLYN) 2.5 MG tablet Take 2.5 mg by mouth daily as needed. For fluid gain     pantoprazole (PROTONIX) 40 MG tablet Take 1 tablet (40 mg total) by mouth daily. Qty: 30 tablet, Refills: 2    potassium chloride SA (K-DUR,KLOR-CON) 20 MEQ tablet Take 20 mEq by mouth daily.      spironolactone (ALDACTONE) 25 MG tablet Take 1 tablet (25 mg total) by mouth daily. Qty: 30 tablet, Refills: 2   Associated Diagnoses: Chronic systolic heart failure      STOP taking these medications     warfarin (COUMADIN) 2 MG tablet         Follow-up Plans & Instructions: Discharge Orders    Future Appointments: Provider: Department: Dept Phone: Center:   02/27/2011 10:00 AM [facility]  None     Future Orders Please Complete By Expires   Diet - low sodium heart healthy      Increase activity slowly      STOP any activity that causes chest pain, shortness of breath, dizziness, sweating, or exessive weakness      (HEART FAILURE PATIENTS) Call MD:  Anytime you have any of the following symptoms: 1) 3 pound weight gain in 24 hours or 5 pounds in 1 week 2) shortness of breath, with or without a dry hacking cough 3) swelling in the hands, feet or stomach 4) if you have to sleep on extra pillows at night in order to breathe.      Contraindication to ACEI at discharge        Follow-up Information    Follow up with Arvilla Meres, MD on 03/02/2011. (at 11 am)    Contact information:   [facility] [phone]           BRING ALL MEDICATIONS WITH YOU TO FOLLOW UP APPOINTMENTS  Time spent with patient to include physician time:45 Signed:  Arvilla Meres, MD 02/23/2011, 2:24 PM

## 2011-02-27 ENCOUNTER — Ambulatory Visit (HOSPITAL_COMMUNITY): Payer: Medicare Other | Attending: Adult Health

## 2011-02-27 NOTE — Progress Notes (Signed)
ICD remote 

## 2011-03-02 ENCOUNTER — Telehealth (HOSPITAL_COMMUNITY): Payer: Self-pay | Admitting: Adult Health

## 2011-03-02 ENCOUNTER — Ambulatory Visit (HOSPITAL_COMMUNITY)
Admit: 2011-03-02 | Discharge: 2011-03-02 | Disposition: A | Discharge status: Home / Self Care | Payer: Medicare Other | Source: Ambulatory Visit | Attending: Internal Medicine | Admitting: Internal Medicine

## 2011-03-02 VITALS — HR 104 | Wt 131.8 lb

## 2011-03-02 DIAGNOSIS — I2510 Atherosclerotic heart disease of native coronary artery without angina pectoris: Secondary | ICD-10-CM | POA: Insufficient documentation

## 2011-03-02 DIAGNOSIS — I5022 Chronic systolic (congestive) heart failure: Secondary | ICD-10-CM

## 2011-03-02 MED ORDER — SODIUM CHLORIDE 0.9 % IV BOLUS (SEPSIS): 1000.0000 mL | Freq: Once | INTRAVENOUS | Status: DC

## 2011-03-02 MED ORDER — HYDRALAZINE HCL 25 MG PO TABS: 12.5000 mg | ORAL_TABLET | Freq: Three times a day (TID) | ORAL | Status: DC

## 2011-03-02 NOTE — Telephone Encounter (Signed)
O2 sats decreased 83% while sleeping greater than 5 minutes. Will need 2 liters continuous oxygen at night. [name]   CLEGG,AMY NP-C 2:53 PM

## 2011-03-02 NOTE — Telephone Encounter (Signed)
ONO on room air to qualify patient for oxygen by [facility].

## 2011-03-02 NOTE — Patient Instructions (Addendum)
Hold Lasix until 03/05/2011 then take Lasix 40 mg once a day  Take Hydralazine 12.5 mg three times a day.   Follow next up next week for BMET/CBC  Follow up next week.

## 2011-03-02 NOTE — Telephone Encounter (Signed)
Will need 2 Liters oxygen at night. Diagnosis: Chronic Systolic Heart Failure.  CLEGG,AMY NP-c 3:17 PM

## 2011-03-02 NOTE — Telephone Encounter (Signed)
Home Health referral for RN and home oxygen. Home oxygen needed. O2 sats decreased to 83% while sleeping .

## 2011-03-08 ENCOUNTER — Encounter: Payer: Self-pay | Admitting: Internal Medicine

## 2011-03-08 ENCOUNTER — Ambulatory Visit (HOSPITAL_COMMUNITY)
Admission: RE | Admit: 2011-03-08 | Discharge: 2011-03-08 | Disposition: A | Discharge status: Home / Self Care | Payer: Medicare Other | Source: Ambulatory Visit | Attending: Internal Medicine | Admitting: Internal Medicine

## 2011-03-08 VITALS — BP 82/56 | HR 81 | Wt 135.2 lb

## 2011-03-08 DIAGNOSIS — I5022 Chronic systolic (congestive) heart failure: Secondary | ICD-10-CM | POA: Insufficient documentation

## 2011-03-08 NOTE — Assessment & Plan Note (Signed)
Severe CAD by recent cath. Myoview of limited quality but suggests viability. Will f/u with Dr. Donata Clay to see if she is surgical candidate.

## 2011-03-08 NOTE — Progress Notes (Signed)
ADVANCED HF CLINIC NOTE  History of Present Illness: Primary Cardiologist:  Dr. Arvilla Meres  Jessica [name] is a 50 y.o. female with a history of severe congestive heart failure, secondary ischemic cardiomyopathy with an ejection fraction of 25-30%. She also has a history of coronary artery disease, status post multiple interventions and stents, diabetes, hyperlipidemia, history of ongoing tobacco and possibly alcohol use. She has been followed in the heart failure clinic and has had a problem with noncompliance. She has inolerance of ACE-I due to severe cough. Had stress test in 09/2009 for CP. EF: 25 %. LV markedly dilated. 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 No ischemia. She had an ICD shock in Nov 2011. On June 20, 2010, she had a right iliofemoral embolectomy and compartment fasciotomy of the right leg by Dr. Darrick Penna. Also found to have LLE DVT. Diovan, Toprol and lasix held at discharge. We saw her back in clinic and attempted to restart her meds.   Seen in ER in October with fluid overload. Got IV lasix with 6 lb diuresis and felt better. Recently seen in clinic with CP and low-output HF symptoms. Unable to tolerate b-blocker and ARB as outpatient due to low BP. Underwent outpatient cath 12/11: showed severe 3 vessel CAD with occlusion of multiple stents, EF 10-15% with elevated filling pressures and cardiogenic shock physiology, and mixed pulmonary HTN.   12/11 R heart Cath  RA = 18  RV = 67/10/20,  PA = 56/30 (41)  PCW = 23,  Ao = 97/72 (83)  LV = 93/20/30  Fick cardiac output/index = 2.8/1.7  PVR = 6.5 Woods  SVR = 1878,  FA sat = 88%, PA sat = 42%, 46%,   Admitted for IV diuresis and viability assessment for possible CABG. Diuresed well with IV lasix with symptomatic improvement. Able to add on hydralazine and nitrates. Diuresed from 146 to 137 pounds. While in hospital repeatedly asking to smoke.    Echo 12/13: Only basal function preserved. EF 20-25%. Seen by Dr. Donata Clay who was awaiting results of viability study to decide on high-risk CABG. She declined LVAD. Coumadin stopped on d/c due to noncompliance.  Discharge from [facility] 12/11   She returns for follow up post-hospitalization. Complains of fatigue and dizziness. Sleeping most of the day. Weight dropping at home. Down to 131-133. (Now 40 pounds down over last year). She did take Metolazone twice last week. Smoked one cigarette when she was discharged er own. No edema. Chronic 3-4 pillow orthopnea. . Has been off ARB and b-blocker for months due to low BP. Very occasional CP and pain down her L arm.    Past Medical History  Diagnosis Date  . Chronic systolic heart failure     a.  secondary to severe ischemic cardiomyopathy with EF 20-25%;  b. s/p rimplantation of Biotronik single chamber ICD by Dr Ladona Ridgel 02/2008;  c.  Right heart cath 04/2007: RA 18, PA 56/30 (40), PCWP 36. FICK 3.89/2.06 Woods;  d. echo 9/09: Ef 20-25%, mild MR, mild LAE  . CAD (coronary artery disease)     a. s/p multiple PCIs; b. cath 10/07: LAD stent 40%, dx 80% ISR - tx with POBA; OM stent ok; ostial OM 60%; RCA occluded with L-R collats  . Tobacco abuse   . Alcohol abuse   . History of noncompliance with medical treatment   . Diabetes mellitus, type 2   . Dizziness   .  HLD (hyperlipidemia)   . DVT (deep venous thrombosis)     coumadin  . Arterial embolism     s/p right iliofemoral embolectomy with fasciotomy  . V-tach   . AICD (automatic cardioverter/defibrillator) present   . Angina   . Asthma   . Shortness of breath   . Sleep apnea   . CHF (congestive heart failure)   . GERD (gastroesophageal reflux disease)   . Anxiety   . Myocardial infarction   . COPD (chronic obstructive pulmonary disease)     Current Outpatient Prescriptions  Medication Sig Dispense Refill  . aspirin 81 MG EC tablet Take 4 tablets (325 mg total) by mouth daily.   30 tablet    . digoxin (LANOXIN) 0.125 MG tablet Take 1 tablet (125 mcg total) by mouth daily.  30 tablet  6  . glimepiride (AMARYL) 2 MG tablet TAKE ONE TABLET BY MOUTH EVERY DAY  30 tablet  6  . hydrALAZINE (APRESOLINE) 25 MG tablet Take 0.5 tablets (12.5 mg total) by mouth 3 (three) times daily.  90 tablet  6  . hydrOXYzine (ATARAX/VISTARIL) 10 MG tablet Take 10 mg by mouth 2 (two) times daily as needed. For itching       . isosorbide mononitrate (IMDUR) 30 MG 24 hr tablet Take 1 tablet (30 mg total) by mouth daily.  30 tablet  6  . lovastatin (MEVACOR) 40 MG tablet Take 1 tablet (40 mg total) by mouth at bedtime.  30 tablet  2  . magnesium oxide (MAG-OX) 400 MG tablet Take 400 mg by mouth daily.        . metolazone (ZAROXOLYN) 2.5 MG tablet Take 2.5 mg by mouth daily as needed. For fluid gain       . nitroGLYCERIN (NITROSTAT) 0.4 MG SL tablet Place 1 tablet (0.4 mg total) under the tongue every 5 (five) minutes as needed for chest pain.  25 tablet  3  . pantoprazole (PROTONIX) 40 MG tablet Take 1 tablet (40 mg total) by mouth daily.  30 tablet  2  . potassium chloride SA (K-DUR,KLOR-CON) 20 MEQ tablet Take 20 mEq by mouth daily.        [name] spironolactone (ALDACTONE) 25 MG tablet Take 1 tablet (25 mg total) by mouth daily.  30 tablet  2  . furosemide (LASIX) 40 MG tablet Take 40 mg by mouth daily.          Allergies: Allergies  Allergen Reactions  . Ramipril Cough  . Latex Rash    Vital Signs: BP 74/palp by Doppler HR 104. WT 131.8 PHYSICAL EXAM: Fatigued appearging. weak in no acute distress HEENT: normal Neck: Carotids 2+ bilaterally. No bruits. JVP flat Cardiac:  normal S1, S2; tachy regular + s3 Lungs:  clear to auscultation bilaterally, no wheezing, rhonchi or rales Abd: soft,  nontender, no hepatomegaly. Pustular rash over belly and arms Ext: No cyanosis or clubbing. Post surgical changes . No edema Skin: warm and dry Neuro:  CNs 2-12 intact, no focal abnormalities  noted   ASSESSMENT AND PLAN:

## 2011-03-08 NOTE — Patient Instructions (Signed)
Your physician recommends that you schedule a follow-up appointment in: 2 weeks  

## 2011-03-08 NOTE — Progress Notes (Signed)
HPI:  Jessica [name] is a 50 y.o. female with a history of severe congestive heart failure, secondary ischemic cardiomyopathy with an ejection fraction of 25-30%. She also has a history of coronary artery disease, status post multiple interventions and stents, diabetes, hyperlipidemia, history of ongoing tobacco and possibly alcohol use. She has been followed in the heart failure clinic and has had a problem with noncompliance. She has inolerance of ACE-I due to severe cough. Had stress test in 09/2009 for CP. EF: 25 %. LV markedly dilated. 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 No ischemia. She had an ICD shock in Nov 2011. On June 20, 2010, she had a right iliofemoral embolectomy and compartment fasciotomy of the right leg by Dr. Darrick Penna. Also found to have LLE DVT. Diovan, Toprol and lasix held at discharge. We saw her back in clinic and attempted to restart her meds.  Seen in ER in October with fluid overload. Got IV lasix with 6 lb diuresis and felt better. Recently seen in clinic with CP and low-output HF symptoms. Unable to tolerate b-blocker and ARB as outpatient due to low BP. Underwent outpatient cath 12/11: showed severe 3 vessel CAD with occlusion of multiple stents, EF 10-15% with elevated filling pressures and cardiogenic shock physiology, and mixed pulmonary HTN.  12/11 R heart Cath  RA = 18  RV = 67/10/20,  PA = 56/30 (41)  PCW = 23,  Ao = 97/72 (83)  LV = 93/20/30  Fick cardiac output/index = 2.8/1.7  PVR = 6.5 Woods  SVR = 1878,  FA sat = 88%, PA sat = 42%, 46%,   Admitted for IV diuresis and viability assessment for possible CABG. Diuresed well with IV lasix with symptomatic improvement. Able to add on hydralazine and nitrates. Diuresed from 146 to 137 pounds. While in hospital repeatedly asking to smoke.   Echo 12/13: Only basal function preserved. EF 20-25%. Seen by Dr. Donata Clay who was awaiting results  of viability study to decide on high-risk CABG. She declined LVAD. Coumadin stopped on d/c due to noncompliance.  Discharge from [facility] 12/11   Seen last week for follow up post-hospitalization. SBP 74. Weight down to 131. Given 1L NS. Imdur and hydralazine held. Lasix cut back to 40 daily. Takes lasix every other day. Feels better but still weak. No edema/orthopnea/PND.   Back to smoking a few cigarettes/day.   ROS: All systems negative except as listed in HPI, PMH and Problem List.  Past Medical History  Diagnosis Date  . Chronic systolic heart failure     a.  secondary to severe ischemic cardiomyopathy with EF 20-25%;  b. s/p rimplantation of Biotronik single chamber ICD by Dr Ladona Ridgel 02/2008;  c.  Right heart cath 04/2007: RA 18, PA 56/30 (40), PCWP 36. FICK 3.89/2.06 Woods;  d. echo 9/09: Ef 20-25%, mild MR, mild LAE  . CAD (coronary artery disease)     a. s/p multiple PCIs; b. cath 10/07: LAD stent 40%, dx 80% ISR - tx with POBA; OM stent ok; ostial OM 60%; RCA occluded with L-R collats  . Tobacco abuse   . Alcohol abuse   . History of noncompliance with medical treatment   . Diabetes mellitus, type 2   . Dizziness   . HLD (hyperlipidemia)   . DVT (deep venous thrombosis)     coumadin  . Arterial embolism     s/p right iliofemoral embolectomy with fasciotomy  .  V-tach   . AICD (automatic cardioverter/defibrillator) present   . Angina   . Asthma   . Shortness of breath   . Sleep apnea   . CHF (congestive heart failure)   . GERD (gastroesophageal reflux disease)   . Anxiety   . Myocardial infarction   . COPD (chronic obstructive pulmonary disease)     Current Outpatient Prescriptions  Medication Sig Dispense Refill  . aspirin 81 MG EC tablet Take 4 tablets (325 mg total) by mouth daily.  30 tablet    . digoxin (LANOXIN) 0.125 MG tablet Take 1 tablet (125 mcg total) by mouth daily.  30 tablet  6  . furosemide (LASIX) 40 MG tablet Take 40 mg by mouth daily.        [name]  glimepiride (AMARYL) 2 MG tablet TAKE ONE TABLET BY MOUTH EVERY DAY  30 tablet  6  . hydrOXYzine (ATARAX/VISTARIL) 10 MG tablet Take 10 mg by mouth 2 (two) times daily as needed. For itching       . lovastatin (MEVACOR) 40 MG tablet Take 1 tablet (40 mg total) by mouth at bedtime.  30 tablet  2  . magnesium oxide (MAG-OX) 400 MG tablet Take 400 mg by mouth daily.        . metolazone (ZAROXOLYN) 2.5 MG tablet Take 2.5 mg by mouth daily as needed. For fluid gain       . nitroGLYCERIN (NITROSTAT) 0.4 MG SL tablet Place 1 tablet (0.4 mg total) under the tongue every 5 (five) minutes as needed for chest pain.  25 tablet  3  . pantoprazole (PROTONIX) 40 MG tablet Take 1 tablet (40 mg total) by mouth daily.  30 tablet  2  . potassium chloride SA (K-DUR,KLOR-CON) 20 MEQ tablet Take 20 mEq by mouth daily.        [name] spironolactone (ALDACTONE) 25 MG tablet Take 1 tablet (25 mg total) by mouth daily.  30 tablet  2  . hydrALAZINE (APRESOLINE) 25 MG tablet Take 0.5 tablets (12.5 mg total) by mouth 3 (three) times daily.  90 tablet  6  . isosorbide mononitrate (IMDUR) 30 MG 24 hr tablet Take 1 tablet (30 mg total) by mouth daily.  30 tablet  6     PHYSICAL EXAM: Filed Vitals:   03/08/11 1509  BP: 82/56  Pulse: 81  Wt 135 (131) General:  OK appearing. No resp difficulty HEENT: normal Neck: supple. JVP flat. Carotids 2+ bilaterally; no bruits. No lymphadenopathy or thryomegaly appreciated. Cor: PMI laterally displaced. Regular rate & rhythm. No rubs, or murmurs. + s3 Lungs: clear Abdomen: soft, nontender, nondistended. No hepatosplenomegaly. No bruits or masses. Good bowel sounds. Extremities: no cyanosis, clubbing, rash, edema Neuro: alert & orientedx3, cranial nerves grossly intact. Moves all 4 extremities w/o difficulty. Affect pleasant.  ASSESSMENT & PLAN:

## 2011-03-08 NOTE — Assessment & Plan Note (Signed)
She is dry and hypotensive today. Will place IV and giver her 1L NS and see how she responds. May need admission.   Addendum. Improved with IVF. SBP near 100. Will d/c home with close f/u. Call if feeling bad.   Time spent > 1 hour

## 2011-03-12 NOTE — Assessment & Plan Note (Signed)
Volume status improved but she continues to struggle with low output HF. BP remains tenuous. Long talk about options and fact that prognosis is quite guarded at this time. Not candidate for Tx given ongoing tobacco use. I am also concerned that she may not do well with LVAD given compliance issues in past - though these have improved. We will refer her back to Dr. Donata Clay for consideration of high-risk CABG +/- bail-out VAD. We also discussed palliative care options. We will see what Dr. Donata Clay thinks and then make a final decision with her. No change in meds at this time. Reinforced need for daily weights and reviewed use of sliding scale diuretics.  Total MD time spent = 40 mins with over 70% of that time dedicated to counseling and discussions described above.

## 2011-03-14 ENCOUNTER — Encounter: Payer: Self-pay | Admitting: *Deleted

## 2011-03-16 ENCOUNTER — Encounter: Payer: Medicare Other | Admitting: Cardiothoracic Surgery

## 2011-03-20 ENCOUNTER — Encounter: Payer: Medicare Other | Admitting: Cardiothoracic Surgery

## 2011-03-20 ENCOUNTER — Encounter (HOSPITAL_COMMUNITY): Payer: Self-pay

## 2011-03-20 ENCOUNTER — Ambulatory Visit (HOSPITAL_COMMUNITY)
Admission: RE | Admit: 2011-03-20 | Discharge: 2011-03-20 | Disposition: A | Discharge status: Home / Self Care | Payer: Medicare Other | Source: Ambulatory Visit | Attending: Internal Medicine | Admitting: Internal Medicine

## 2011-03-20 VITALS — BP 98/68 | HR 93 | Wt 133.0 lb

## 2011-03-20 DIAGNOSIS — F17200 Nicotine dependence, unspecified, uncomplicated: Secondary | ICD-10-CM | POA: Insufficient documentation

## 2011-03-20 DIAGNOSIS — I5022 Chronic systolic (congestive) heart failure: Secondary | ICD-10-CM

## 2011-03-20 DIAGNOSIS — I2510 Atherosclerotic heart disease of native coronary artery without angina pectoris: Secondary | ICD-10-CM | POA: Insufficient documentation

## 2011-03-20 MED ORDER — METOPROLOL SUCCINATE ER 25 MG PO TB24: 25.0000 mg | ORAL_TABLET | Freq: Every day | ORAL | Status: DC

## 2011-03-20 NOTE — Patient Instructions (Signed)
Take Toprol XL 25 mg at bed time.  Follow up in 4 weeks.  Do the following things EVERYDAY: 1) Weigh yourself in the morning before breakfast. Write it down and keep it in a log. 2) Take your medicines as prescribed 3) Eat low salt foods-Limit salt (sodium) to 2000mg  per day.  4) Stay as active as you can everyday

## 2011-03-22 ENCOUNTER — Encounter: Payer: Self-pay | Admitting: Cardiothoracic Surgery

## 2011-03-22 ENCOUNTER — Institutional Professional Consult (permissible substitution) (INDEPENDENT_AMBULATORY_CARE_PROVIDER_SITE_OTHER): Payer: Medicare Other | Admitting: Cardiothoracic Surgery

## 2011-03-22 VITALS — BP 111/77 | HR 96 | Resp 20 | Ht 64.0 in | Wt 127.0 lb

## 2011-03-22 DIAGNOSIS — I2510 Atherosclerotic heart disease of native coronary artery without angina pectoris: Secondary | ICD-10-CM

## 2011-03-22 DIAGNOSIS — I509 Heart failure, unspecified: Secondary | ICD-10-CM

## 2011-03-22 DIAGNOSIS — I429 Cardiomyopathy, unspecified: Secondary | ICD-10-CM

## 2011-03-22 DIAGNOSIS — I428 Other cardiomyopathies: Secondary | ICD-10-CM

## 2011-03-22 NOTE — Progress Notes (Signed)
HPI:                               [address]            [place] [address]          [phone]     Ischemic cardiomyopathy with EF 10-15 by cath Graftable CAD Active smoking Hx noncompliance with coumadin (for DVT) Recent admission for decompensated CHF, now stable class III  Patient presents for eval of surgical options Current Outpatient Prescriptions  Medication Sig Dispense Refill  . aspirin 81 MG EC tablet Take 4 tablets (325 mg total) by mouth daily.  30 tablet    . digoxin (LANOXIN) 0.125 MG tablet Take 1 tablet (125 mcg total) by mouth daily.  30 tablet  6  . furosemide (LASIX) 40 MG tablet Take 40 mg by mouth daily.        [name] glimepiride (AMARYL) 2 MG tablet TAKE ONE TABLET BY MOUTH EVERY DAY  30 tablet  6  . hydrOXYzine (ATARAX/VISTARIL) 10 MG tablet Take 10 mg by mouth 2 (two) times daily as needed. For itching       . lovastatin (MEVACOR) 40 MG tablet Take 1 tablet (40 mg total) by mouth at bedtime.  30 tablet  2  . magnesium oxide (MAG-OX) 400 MG tablet Take 400 mg by mouth daily.        . metolazone (ZAROXOLYN) 2.5 MG tablet Take 2.5 mg by mouth daily as needed. For fluid gain       . metoprolol succinate (TOPROL XL) 25 MG 24 hr tablet Take 1 tablet (25 mg total) by mouth at bedtime.  30 tablet  6  . nitroGLYCERIN (NITROSTAT) 0.4 MG SL tablet Place 1 tablet (0.4 mg total) under the tongue every 5 (five) minutes as needed for chest pain.  25 tablet  3  . pantoprazole (PROTONIX) 40 MG tablet Take 1 tablet (40 mg total) by mouth daily.  30 tablet  2  . potassium chloride SA (K-DUR,KLOR-CON) 20 MEQ tablet Take 20 mEq by mouth daily.        [name] spironolactone (ALDACTONE) 25 MG tablet Take 1 tablet (25 mg total) by mouth daily.  30 tablet  2  . hydrALAZINE (APRESOLINE) 25 MG tablet Take 0.5 tablets (12.5 mg total) by mouth 3 (three) times daily.  90 tablet  6  . isosorbide mononitrate (IMDUR) 30 MG 24 hr tablet Take 1 tablet (30 mg total) by mouth daily.  30  tablet  6     Physical Exam:VS111/77,HR 96 O2 96% room air Wt 127lbs Ht 5-3                           No distress walked 200 ft comfortably                           HEENT normal                           No caroti bruit no adenopathy                           Lungs distant bs  NSR S4 1/6 murmur TR                           No edema  Diagnostic Tests     Echo,Cath, Thallium delayed perfusion scan reviewed ---very poor LV without sig viability ant ,apica,l laterally. No PFO Mod TR       FVC2.5  FEV1 1.97  Impression: Not CABG candidate with very low EF and CI Possible VAD (DT or Bridge) if smoking and compliance issues resolved  Plan:Cont to follow in HF Clinic. Reassess for CABG if she shows improvement in EF,CI                                                     Reassess for LVAD if issues with compliance resolved

## 2011-03-30 NOTE — Progress Notes (Signed)
Patient ID: Jessica [name], female   DOB: 03/19/1960, 51 y.o.   MRN: [id number] Patient ID: Jessica [name], female   DOB: 1960/04/18, 51 y.o.   MRN: [id number] HPI:  Jessica [name] is a 51 y.o. female with a history of severe congestive heart failure, secondary ischemic cardiomyopathy with an ejection fraction of 25-30%. She also has a history of coronary artery disease, status post multiple interventions and stents, diabetes, hyperlipidemia, history of ongoing tobacco and possibly alcohol use. She has been followed in the heart failure clinic and has had a problem with noncompliance. She has inolerance of ACE-I due to severe cough. Had stress test in 09/2009 for CP. EF: 25 %. LV markedly dilated. 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 No ischemia. She had an ICD shock in Nov 2011. On June 20, 2010, she had a right iliofemoral embolectomy and compartment fasciotomy of the right leg by Dr. Darrick Penna. Also found to have LLE DVT. Diovan, Toprol and lasix held at discharge. We saw her back in clinic and attempted to restart her meds.  Seen in ER in October with fluid overload. Got IV lasix with 6 lb diuresis and felt better. Recently seen in clinic with CP and low-output HF symptoms. Unable to tolerate b-blocker and ARB as outpatient due to low BP. Underwent outpatient cath 12/11: showed severe 3 vessel CAD with occlusion of multiple stents, EF 10-15% with elevated filling pressures and cardiogenic shock physiology, and mixed pulmonary HTN.  12/11 R heart Cath  RA = 18  RV = 67/10/20,  PA = 56/30 (41)  PCW = 23,  Ao = 97/72 (83)  LV = 93/20/30  Fick cardiac output/index = 2.8/1.7  PVR = 6.5 Woods  SVR = 1878,  FA sat = 88%, PA sat = 42%, 46%,   Admitted for IV diuresis and viability assessment for possible CABG. Diuresed well with IV lasix with symptomatic improvement. Able to add on hydralazine and nitrates. Diuresed from 146 to 137  pounds. While in hospital repeatedly asking to smoke.   Echo 12/13: Only basal function preserved. EF 20-25%. Seen by Dr. Donata Clay who was awaiting results of viability study to decide on high-risk CABG. She declined LVAD. Coumadin stopped on d/c due to noncompliance.  Discharge from [facility] 12/11   She has been off beta blockers for over 6 months and did not tolerate.   She is here for follow up. Feels good. Denies SOB/PND/Orthopnea. Weight at home 129-131. She is not taking hydralazine and IMDUR. SBP less than 90.  Only taking Lasix every other day. Followed by home health every other week. No lower extremity edema. Smokes 2-3 cigarettes per day.   ROS: All systems negative except as listed in HPI, PMH and Problem List.  Past Medical History  Diagnosis Date  . Chronic systolic heart failure     a.  secondary to severe ischemic cardiomyopathy with EF 20-25%;  b. s/p rimplantation of Biotronik single chamber ICD by Dr Ladona Ridgel 02/2008;  c.  Right heart cath 04/2007: RA 18, PA 56/30 (40), PCWP 36. FICK 3.89/2.06 Woods;  d. echo 9/09: Ef 20-25%, mild MR, mild LAE  . CAD (coronary artery disease)     a. s/p multiple PCIs; b. cath 10/07: LAD stent 40%, dx 80% ISR - tx with POBA; OM stent ok; ostial OM 60%; RCA occluded with L-R collats  . Tobacco abuse   . Alcohol abuse   .  History of noncompliance with medical treatment   . Diabetes mellitus, type 2   . Dizziness   . HLD (hyperlipidemia)   . DVT (deep venous thrombosis)     coumadin  . Arterial embolism     s/p right iliofemoral embolectomy with fasciotomy  . V-tach   . AICD (automatic cardioverter/defibrillator) present   . Angina   . Asthma   . Shortness of breath   . Sleep apnea   . CHF (congestive heart failure)   . GERD (gastroesophageal reflux disease)   . Anxiety   . Myocardial infarction   . COPD (chronic obstructive pulmonary disease)     Current Outpatient Prescriptions  Medication Sig Dispense Refill  . aspirin 81  MG EC tablet Take 4 tablets (325 mg total) by mouth daily.  30 tablet    . digoxin (LANOXIN) 0.125 MG tablet Take 1 tablet (125 mcg total) by mouth daily.  30 tablet  6  . furosemide (LASIX) 40 MG tablet Take 40 mg by mouth daily.        [name] glimepiride (AMARYL) 2 MG tablet TAKE ONE TABLET BY MOUTH EVERY DAY  30 tablet  6  . hydrOXYzine (ATARAX/VISTARIL) 10 MG tablet Take 10 mg by mouth 2 (two) times daily as needed. For itching       . lovastatin (MEVACOR) 40 MG tablet Take 1 tablet (40 mg total) by mouth at bedtime.  30 tablet  2  . metolazone (ZAROXOLYN) 2.5 MG tablet Take 2.5 mg by mouth daily as needed. For fluid gain       . pantoprazole (PROTONIX) 40 MG tablet Take 1 tablet (40 mg total) by mouth daily.  30 tablet  2  . potassium chloride SA (K-DUR,KLOR-CON) 20 MEQ tablet Take 20 mEq by mouth daily.        [name] spironolactone (ALDACTONE) 25 MG tablet Take 1 tablet (25 mg total) by mouth daily.  30 tablet  2  . hydrALAZINE (APRESOLINE) 25 MG tablet Take 0.5 tablets (12.5 mg total) by mouth 3 (three) times daily.  90 tablet  6  . isosorbide mononitrate (IMDUR) 30 MG 24 hr tablet Take 1 tablet (30 mg total) by mouth daily.  30 tablet  6  . magnesium oxide (MAG-OX) 400 MG tablet Take 400 mg by mouth daily.        . metoprolol succinate (TOPROL XL) 25 MG 24 hr tablet Take 1 tablet (25 mg total) by mouth at bedtime.  30 tablet  6  . nitroGLYCERIN (NITROSTAT) 0.4 MG SL tablet Place 1 tablet (0.4 mg total) under the tongue every 5 (five) minutes as needed for chest pain.  25 tablet  3     PHYSICAL EXAM: Filed Vitals:   03/20/11 1238  BP: 98/68  Pulse: 93  Wt 133 (135) General:  OK appearing. No resp difficulty HEENT: normal Neck: supple. JVP flat. Carotids 2+ bilaterally; no bruits. No lymphadenopathy or thryomegaly appreciated. Cor: PMI laterally displaced. Regular rate & rhythm. No rubs, or murmurs. + s3 Lungs: clear Abdomen: soft, nontender, nondistended. No hepatosplenomegaly. No bruits or  masses. Good bowel sounds. Extremities: no cyanosis, clubbing, rash, edema Neuro: alert & orientedx3, cranial nerves grossly intact. Moves all 4 extremities w/o difficulty. Affect pleasant.  ASSESSMENT & PLAN:

## 2011-03-30 NOTE — Assessment & Plan Note (Addendum)
She has advanced HF with few viable options.  We had a long talk about her situation. It does not appear that she is a viable candidate for high-risk CABG. She is not interested in VAD and would likely would not be a good candidate. Thus will continue with medical therapy as tolerated. Start Toprol 25 qhs.

## 2011-03-30 NOTE — Assessment & Plan Note (Signed)
Counseled on need to stop smoking.  

## 2011-03-30 NOTE — Assessment & Plan Note (Signed)
We reviewed cath. No options for PCI. Not CABG candidate. Continue medical therapy.

## 2011-04-02 DIAGNOSIS — E119 Type 2 diabetes mellitus without complications: Secondary | ICD-10-CM

## 2011-04-02 DIAGNOSIS — I2589 Other forms of chronic ischemic heart disease: Secondary | ICD-10-CM

## 2011-04-02 DIAGNOSIS — I5023 Acute on chronic systolic (congestive) heart failure: Secondary | ICD-10-CM

## 2011-04-06 ENCOUNTER — Other Ambulatory Visit (HOSPITAL_COMMUNITY): Payer: Self-pay | Admitting: Adult Health

## 2011-04-10 ENCOUNTER — Ambulatory Visit: Payer: Self-pay | Admitting: Cardiology

## 2011-04-10 DIAGNOSIS — I824Z9 Acute embolism and thrombosis of unspecified deep veins of unspecified distal lower extremity: Secondary | ICD-10-CM

## 2011-04-17 ENCOUNTER — Other Ambulatory Visit (HOSPITAL_COMMUNITY): Payer: Self-pay | Admitting: *Deleted

## 2011-04-17 MED ORDER — HYDROXYZINE HCL 10 MG PO TABS: 10.0000 mg | ORAL_TABLET | Freq: Two times a day (BID) | ORAL | Status: DC | PRN

## 2011-04-19 ENCOUNTER — Ambulatory Visit (HOSPITAL_COMMUNITY)
Admission: RE | Admit: 2011-04-19 | Discharge: 2011-04-19 | Disposition: A | Discharge status: Home / Self Care | Payer: Medicare Other | Source: Ambulatory Visit | Attending: Internal Medicine | Admitting: Internal Medicine

## 2011-04-19 VITALS — BP 100/68 | HR 98 | Wt 134.5 lb

## 2011-04-19 DIAGNOSIS — F418 Other specified anxiety disorders: Secondary | ICD-10-CM

## 2011-04-19 DIAGNOSIS — F17200 Nicotine dependence, unspecified, uncomplicated: Secondary | ICD-10-CM

## 2011-04-19 DIAGNOSIS — I2510 Atherosclerotic heart disease of native coronary artery without angina pectoris: Secondary | ICD-10-CM | POA: Insufficient documentation

## 2011-04-19 DIAGNOSIS — F341 Dysthymic disorder: Secondary | ICD-10-CM

## 2011-04-19 DIAGNOSIS — I5022 Chronic systolic (congestive) heart failure: Secondary | ICD-10-CM

## 2011-04-19 MED ORDER — SERTRALINE HCL 50 MG PO TABS: 50.0000 mg | ORAL_TABLET | Freq: Every day | ORAL | Status: DC

## 2011-04-19 MED ORDER — DIAZEPAM 5 MG PO TABS: 2.5000 mg | ORAL_TABLET | Freq: Two times a day (BID) | ORAL | Status: AC | PRN

## 2011-04-19 NOTE — Patient Instructions (Addendum)
Add zoloft 25 mg for several days then increase to 50 mg daily.  Can take valium as needed for anxiety.    Take metolazone 2.5 mg today and tomorrow.  Then start 2.5 mg once weekly.  Follow up 1 month.

## 2011-04-30 DIAGNOSIS — F418 Other specified anxiety disorders: Secondary | ICD-10-CM | POA: Insufficient documentation

## 2011-04-30 NOTE — Assessment & Plan Note (Addendum)
Long talk about her situational depression. Will start sertraline. Has prn benzos for anxiety.

## 2011-04-30 NOTE — Progress Notes (Signed)
HPI:  Jessica [name] is a 51 y.o. female with a history of severe congestive heart failure, secondary ischemic cardiomyopathy with an ejection fraction of 25-30%. She also has a history of coronary artery disease, status post multiple interventions, ICD implantation, diabetes, hyperlipidemia, history of ongoing tobacco and possibly alcohol use.  Status post right iliofemoral embolectomy and compartment fasciotomy of the right leg by Dr. Darrick Penna with LLE DVT 06/2010.    She has a history of noncompliance.  She has inolerance of ACE-I due to severe cough.  Problem tolerating b-blocker and ARB due to low BP.    Stress test in 09/2009 for CP. EF: 25 %. LV markedly dilated. 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 No ischemia.  Cath 12/12: showed severe 3 vessel CAD with occlusion of multiple stents, EF 10-15%  12/12 R heart Cath: RA = 18, RV = 67/10/20, PA = 56/30 (41), PCW = 23, Ao = 97/72 (83), LV = 93/20/30, Fick cardiac output/index = 2.8/1.7, PVR = 6.5 Woods  SVR = 1878, FA sat = 88%, PA sat = 42%, 46%,   Echo 12/13: Only basal function preserved. EF 20-25%.   Seen by Dr. Donata Clay who states she is not a CABG candidate due to too much scar tissue.  She declined LVAD.  Medical therapy continued.  Coumadin stopped for DVT due to noncompliance with 6 months of completed therapy.     She returns for follow up today.  Feels depressed today, "nerves are shot".  Denies dyspnea.  + orthopnea/PND.  Not weighing daily.  Increased fluid intake over the last week.  No lower extremity edema.  Smoking couple of cigarettes to a couple of packs a day.           ROS: All systems negative except as listed in HPI, PMH and Problem List.  Past Medical History  Diagnosis Date  . Chronic systolic heart failure     a.  secondary to severe ischemic cardiomyopathy with EF 20-25%;  b. s/p rimplantation of Biotronik single chamber ICD by Dr Ladona Ridgel  02/2008;  c.  Right heart cath 04/2007: RA 18, PA 56/30 (40), PCWP 36. FICK 3.89/2.06 Woods;  d. echo 9/09: Ef 20-25%, mild MR, mild LAE  . CAD (coronary artery disease)     a. s/p multiple PCIs; b. cath 10/07: LAD stent 40%, dx 80% ISR - tx with POBA; OM stent ok; ostial OM 60%; RCA occluded with L-R collats  . Tobacco abuse   . Alcohol abuse   . History of noncompliance with medical treatment   . Diabetes mellitus, type 2   . Dizziness   . HLD (hyperlipidemia)   . DVT (deep venous thrombosis)     coumadin  . Arterial embolism     s/p right iliofemoral embolectomy with fasciotomy  . V-tach   . AICD (automatic cardioverter/defibrillator) present   . Angina   . Asthma   . Shortness of breath   . Sleep apnea   . CHF (congestive heart failure)   . GERD (gastroesophageal reflux disease)   . Anxiety   . Myocardial infarction   . COPD (chronic obstructive pulmonary disease)     Current Outpatient Prescriptions  Medication Sig Dispense Refill  . aspirin 81 MG EC tablet Take 4 tablets (325 mg total) by mouth daily.  30 tablet    . digoxin (LANOXIN) 0.125 MG tablet Take 1 tablet (125 mcg total) by mouth  daily.  30 tablet  6  . glimepiride (AMARYL) 2 MG tablet TAKE ONE TABLET BY MOUTH EVERY DAY  30 tablet  6  . lovastatin (MEVACOR) 40 MG tablet Take 1 tablet (40 mg total) by mouth at bedtime.  30 tablet  2  . magnesium oxide (MAG-OX) 400 MG tablet Take 400 mg by mouth daily.        . metoprolol succinate (TOPROL XL) 25 MG 24 hr tablet Take 1 tablet (25 mg total) by mouth at bedtime.  30 tablet  6  . pantoprazole (PROTONIX) 40 MG tablet Take 1 tablet (40 mg total) by mouth daily.  30 tablet  2  . potassium chloride SA (K-DUR,KLOR-CON) 20 MEQ tablet Take 20 mEq by mouth daily.        [name] spironolactone (ALDACTONE) 25 MG tablet Take 1 tablet (25 mg total) by mouth daily.  30 tablet  2  . diazepam (VALIUM) 5 MG tablet Take 0.5 tablets (2.5 mg total) by mouth every 12 (twelve) hours as needed for  anxiety.  20 tablet  2  . furosemide (LASIX) 40 MG tablet Take 40 mg by mouth daily.        . metolazone (ZAROXOLYN) 2.5 MG tablet Take 2.5 mg by mouth daily as needed. For fluid gain       . nitroGLYCERIN (NITROSTAT) 0.4 MG SL tablet Place 1 tablet (0.4 mg total) under the tongue every 5 (five) minutes as needed for chest pain.  25 tablet  3  . sertraline (ZOLOFT) 50 MG tablet Take 1 tablet (50 mg total) by mouth daily. Take 1/2 tablet for the first 5-7 days then increase to 1 tablet  30 tablet  6     PHYSICAL EXAM: Filed Vitals:   04/19/11 1139  BP: 100/68  Pulse: 98  Weight: 134 lb 8 oz (61.009 kg)  SpO2: 94%   General:  OK appearing. No resp difficulty HEENT: normal Neck: supple. JVP jaw. + HJR, Carotids 2+ bilaterally; no bruits. No lymphadenopathy or thryomegaly appreciated. Cor: PMI laterally displaced. Regular rate & rhythm. No rubs, or murmurs. + s3 Lungs: clear Abdomen: soft, nontender, mild distention.  No hepatosplenomegaly. No bruits or masses. Good bowel sounds. Extremities: no cyanosis, clubbing, rash, edema Neuro: alert & orientedx3, cranial nerves grossly intact. Moves all 4 extremities w/o difficulty. Affect pleasant.  ASSESSMENT & PLAN:

## 2011-04-30 NOTE — Assessment & Plan Note (Signed)
Unfortunately, Jessica [name] is nearing-end stage CHF. Her weight is up 6 or 7 pounds today but BP remains very soft with inability to tolerate most meds, She was turned down for CABG due to lack of significant viability and she is not interested in VAD or home inotropes at this point. Will give 2 days of metolazone and then use it once a week to try and keep volume under control.

## 2011-04-30 NOTE — Assessment & Plan Note (Signed)
Not interested in cessation

## 2011-04-30 NOTE — Assessment & Plan Note (Signed)
Not CABG candidate due to lack of viability.

## 2011-05-17 ENCOUNTER — Ambulatory Visit (HOSPITAL_COMMUNITY)
Admission: RE | Admit: 2011-05-17 | Discharge: 2011-05-17 | Disposition: A | Discharge status: Home / Self Care | Payer: Medicare Other | Source: Ambulatory Visit | Attending: Internal Medicine | Admitting: Internal Medicine

## 2011-05-17 ENCOUNTER — Encounter (HOSPITAL_COMMUNITY): Payer: Self-pay

## 2011-05-17 VITALS — HR 88 | Wt 134.5 lb

## 2011-05-17 DIAGNOSIS — I5023 Acute on chronic systolic (congestive) heart failure: Secondary | ICD-10-CM

## 2011-05-17 DIAGNOSIS — F17200 Nicotine dependence, unspecified, uncomplicated: Secondary | ICD-10-CM

## 2011-05-17 DIAGNOSIS — G4730 Sleep apnea, unspecified: Secondary | ICD-10-CM | POA: Insufficient documentation

## 2011-05-17 DIAGNOSIS — I2510 Atherosclerotic heart disease of native coronary artery without angina pectoris: Secondary | ICD-10-CM | POA: Insufficient documentation

## 2011-05-17 DIAGNOSIS — Z79899 Other long term (current) drug therapy: Secondary | ICD-10-CM | POA: Insufficient documentation

## 2011-05-17 DIAGNOSIS — I5022 Chronic systolic (congestive) heart failure: Secondary | ICD-10-CM

## 2011-05-17 DIAGNOSIS — Z8673 Personal history of transient ischemic attack (TIA), and cerebral infarction without residual deficits: Secondary | ICD-10-CM | POA: Insufficient documentation

## 2011-05-17 DIAGNOSIS — Z7982 Long term (current) use of aspirin: Secondary | ICD-10-CM | POA: Insufficient documentation

## 2011-05-17 DIAGNOSIS — E785 Hyperlipidemia, unspecified: Secondary | ICD-10-CM | POA: Insufficient documentation

## 2011-05-17 DIAGNOSIS — J4489 Other specified chronic obstructive pulmonary disease: Secondary | ICD-10-CM | POA: Insufficient documentation

## 2011-05-17 DIAGNOSIS — Z95810 Presence of automatic (implantable) cardiac defibrillator: Secondary | ICD-10-CM | POA: Insufficient documentation

## 2011-05-17 DIAGNOSIS — I252 Old myocardial infarction: Secondary | ICD-10-CM | POA: Insufficient documentation

## 2011-05-17 DIAGNOSIS — J449 Chronic obstructive pulmonary disease, unspecified: Secondary | ICD-10-CM | POA: Insufficient documentation

## 2011-05-17 DIAGNOSIS — E119 Type 2 diabetes mellitus without complications: Secondary | ICD-10-CM | POA: Insufficient documentation

## 2011-05-17 LAB — BASIC METABOLIC PANEL
BUN: 17 mg/dL (ref 6–23)
Chloride: 95 mEq/L — ABNORMAL LOW (ref 96–112)
GFR calc Af Amer: 82 mL/min — ABNORMAL LOW (ref >90)
GFR calc non Af Amer: 70 mL/min — ABNORMAL LOW (ref >90)
Potassium: 4 mEq/L (ref 3.5–5.1)
Sodium: 137 mEq/L (ref 135–145)

## 2011-05-17 NOTE — Assessment & Plan Note (Signed)
Discussed smoking cessation however she declines.  

## 2011-05-17 NOTE — Assessment & Plan Note (Addendum)
Doing surprisingly well recently. Volume status mildly elevated. Discussed sliding scale diuretics and instructed to take Metolazone 2.5 mg twice a week. BP too low to re-initiate other HF meds. Continue to reinforce low salt diet.  Not interested in advanced therapies. If deteriorating can consider home inotropes. Follow up in 1 month.

## 2011-05-17 NOTE — Patient Instructions (Signed)
Follow up in 1 month  Do the following things EVERYDAY: 1) Weigh yourself in the morning before breakfast. Write it down and keep it in a log. 2) Take your medicines as prescribed 3) Eat low salt foods--Limit salt (sodium) to 2000mg per day.  4) Stay as active as you can everyday 

## 2011-05-17 NOTE — Assessment & Plan Note (Signed)
Not CABG candidate due to lack of viability.No signs/symptoms of ischemia

## 2011-05-21 NOTE — Progress Notes (Signed)
Patient ID: Jessica [name], female   DOB: Dec 13, 1960, 51 y.o.   MRN: [id number] Patient ID: Jessica [name], female   DOB: 1960/05/20, 51 y.o.   MRN: [id number] HPI:  Jessica [name] is a 51 y.o. female with a history of severe congestive heart failure, secondary ischemic cardiomyopathy with an ejection fraction of 25-30%. She also has a history of coronary artery disease, status post multiple interventions, ICD implantation, diabetes, hyperlipidemia, history of ongoing tobacco and possibly alcohol use.  Status post right iliofemoral embolectomy and compartment fasciotomy of the right leg by Dr. Darrick Penna with LLE DVT 06/2010.    She has a history of noncompliance.  She has inolerance of ACE-I due to severe cough.  Problem tolerating b-blocker and ARB due to low BP.    Stress test in 09/2009 for CP. EF: 25 %. LV markedly dilated. 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 No ischemia.  Cath 12/12: showed severe 3 vessel CAD with occlusion of multiple stents, EF 10-15%  12/12 R heart Cath: RA = 18, RV = 67/10/20, PA = 56/30 (41), PCW = 23, Ao = 97/72 (83), LV = 93/20/30, Fick cardiac output/index = 2.8/1.7, PVR = 6.5 Woods  SVR = 1878, FA sat = 88%, PA sat = 42%, 46%,   Echo 12/13: Only basal function preserved. EF 20-25%.   Seen by Dr. Donata Clay who states she is not a CABG candidate due to too much scar tissue.  She declined LVAD.  Medical therapy continued.  Coumadin stopped for DVT due to noncompliance with 6 months of completed therapy.     She returns for follow up today.  Feels good.  Denies dyspnea/PND + orthopnea. Occasional CP when she over exerts.  Weighing daily 128-130. Taking Metolazone twice week. Taking Metoprolol only if SBP is 120 or greater. Denies lower extremity edema. Smoking 4 cigarettes  day.  She is unable to tolerate Hydralazine due to hypotension.       ROS: All systems negative except as listed in HPI, PMH and  Problem List.  Past Medical History  Diagnosis Date  . Chronic systolic heart failure     a.  secondary to severe ischemic cardiomyopathy with EF 20-25%;  b. s/p rimplantation of Biotronik single chamber ICD by Dr Ladona Ridgel 02/2008;  c.  Right heart cath 04/2007: RA 18, PA 56/30 (40), PCWP 36. FICK 3.89/2.06 Woods;  d. echo 9/09: Ef 20-25%, mild MR, mild LAE  . CAD (coronary artery disease)     a. s/p multiple PCIs; b. cath 10/07: LAD stent 40%, dx 80% ISR - tx with POBA; OM stent ok; ostial OM 60%; RCA occluded with L-R collats  . Tobacco abuse   . Alcohol abuse   . History of noncompliance with medical treatment   . Diabetes mellitus, type 2   . Dizziness   . HLD (hyperlipidemia)   . DVT (deep venous thrombosis)     coumadin  . Arterial embolism     s/p right iliofemoral embolectomy with fasciotomy  . V-tach   . AICD (automatic cardioverter/defibrillator) present   . Angina   . Asthma   . Shortness of breath   . Sleep apnea   . CHF (congestive heart failure)   . GERD (gastroesophageal reflux disease)   . Anxiety   . Myocardial infarction   . COPD (chronic obstructive pulmonary disease)     Current Outpatient Prescriptions  Medication Sig Dispense Refill  .  aspirin 81 MG EC tablet Take 4 tablets (325 mg total) by mouth daily.  30 tablet    . digoxin (LANOXIN) 0.125 MG tablet Take 1 tablet (125 mcg total) by mouth daily.  30 tablet  6  . furosemide (LASIX) 40 MG tablet Take 40 mg by mouth daily.        [name] glimepiride (AMARYL) 2 MG tablet TAKE ONE TABLET BY MOUTH EVERY DAY  30 tablet  6  . lovastatin (MEVACOR) 40 MG tablet Take 1 tablet (40 mg total) by mouth at bedtime.  30 tablet  2  . magnesium oxide (MAG-OX) 400 MG tablet Take 400 mg by mouth daily.        . metolazone (ZAROXOLYN) 2.5 MG tablet Take 2.5 mg by mouth daily as needed. For fluid gain       . metoprolol succinate (TOPROL XL) 25 MG 24 hr tablet Take 1 tablet (25 mg total) by mouth at bedtime.  30 tablet  6  .  nitroGLYCERIN (NITROSTAT) 0.4 MG SL tablet Place 1 tablet (0.4 mg total) under the tongue every 5 (five) minutes as needed for chest pain.  25 tablet  3  . pantoprazole (PROTONIX) 40 MG tablet Take 1 tablet (40 mg total) by mouth daily.  30 tablet  2  . potassium chloride SA (K-DUR,KLOR-CON) 20 MEQ tablet Take 20 mEq by mouth daily.        . sertraline (ZOLOFT) 50 MG tablet Take 1 tablet (50 mg total) by mouth daily. Take 1/2 tablet for the first 5-7 days then increase to 1 tablet  30 tablet  6  . spironolactone (ALDACTONE) 25 MG tablet Take 1 tablet (25 mg total) by mouth daily.  30 tablet  2     PHYSICAL EXAM: Filed Vitals:   05/17/11 1240  Pulse: 88  Weight: 134 lb 8 oz (61.009 kg)  SpO2: 93%  Doppler SBP 85 General:  Well appearing.  No resp difficulty HEENT: normal Neck: supple. JVP7-8 . + HJR, Carotids 2+ bilaterally; no bruits. No lymphadenopathy or thryomegaly appreciated. Cor: PMI laterally displaced. Regular rate & rhythm. No rubs, or murmurs.  Lungs: clear Abdomen: soft, nontender, mild distention.  No hepatosplenomegaly. No bruits or masses. Good bowel sounds. Extremities: no cyanosis, clubbing, rash, edema R mid thigh  Neuro: alert & orientedx3, cranial nerves grossly intact. Moves all 4 extremities w/o difficulty. Affect pleasant.  ASSESSMENT & PLAN:

## 2011-06-25 ENCOUNTER — Ambulatory Visit (HOSPITAL_COMMUNITY)
Admission: RE | Admit: 2011-06-25 | Discharge: 2011-06-25 | Disposition: A | Discharge status: Home / Self Care | Payer: Medicare Other | Source: Ambulatory Visit | Attending: Internal Medicine | Admitting: Internal Medicine

## 2011-06-25 ENCOUNTER — Encounter (HOSPITAL_COMMUNITY): Payer: Self-pay

## 2011-06-25 VITALS — BP 80/52 | HR 87 | Wt 140.5 lb

## 2011-06-25 DIAGNOSIS — F17200 Nicotine dependence, unspecified, uncomplicated: Secondary | ICD-10-CM

## 2011-06-25 DIAGNOSIS — I5022 Chronic systolic (congestive) heart failure: Secondary | ICD-10-CM | POA: Insufficient documentation

## 2011-06-25 LAB — URINALYSIS, ROUTINE W REFLEX MICROSCOPIC
Bilirubin Urine: NEGATIVE
Glucose, UA: NEGATIVE mg/dL
Hgb urine dipstick: NEGATIVE
Ketones, ur: NEGATIVE mg/dL
Nitrite: NEGATIVE
Protein, ur: 100 mg/dL — AB
Specific Gravity, Urine: 1.016 (ref 1.005–1.030)
Urobilinogen, UA: 4 mg/dL — ABNORMAL HIGH (ref 0.0–1.0)
pH: 7 (ref 5.0–8.0)

## 2011-06-25 LAB — URINE MICROSCOPIC-ADD ON

## 2011-06-25 LAB — BASIC METABOLIC PANEL
BUN: 32 mg/dL — ABNORMAL HIGH (ref 6–23)
CO2: 33 mEq/L — ABNORMAL HIGH (ref 19–32)
Calcium: 9.6 mg/dL (ref 8.4–10.5)
Chloride: 89 mEq/L — ABNORMAL LOW (ref 96–112)
Creatinine, Ser: 1.18 mg/dL — ABNORMAL HIGH (ref 0.50–1.10)
GFR calc Af Amer: 60 mL/min — ABNORMAL LOW (ref >90)
GFR calc non Af Amer: 52 mL/min — ABNORMAL LOW (ref >90)
Glucose, Bld: 175 mg/dL — ABNORMAL HIGH (ref 70–99)
Potassium: 3.6 mEq/L (ref 3.5–5.1)
Sodium: 134 mEq/L — ABNORMAL LOW (ref 135–145)

## 2011-06-25 NOTE — Assessment & Plan Note (Addendum)
Counseled on need for smoking cessation. She is not interested in complete cessation.

## 2011-06-25 NOTE — Assessment & Plan Note (Addendum)
Remains NYHA III/IIIB. Not candidate for advanced therapies. HF management complicated by recent gastroenteritis. Volume is up a bit in setting of eating chicken noodle soup for several days. Reinforced need for daily weights and reviewed use of sliding scale diuretics. She will take a metolazone today. Stressed need of restarting her current HF meds (as BP tolerates). Check labs. We will refill her spiro. Continue to follow closely. We have discussed palliative care involvement and she is not ready for that at this point.

## 2011-06-25 NOTE — Patient Instructions (Signed)
Take extra dose of Metolazone today. Continue to monitor fluid intake and try to increase food intake. Call if weight continues to go up. Get Spironolactone refilled at pharmacy.   Follow up 1 month  Do the following things EVERYDAY: 1) Weigh yourself in the morning before breakfast. Write it down and keep it in a log. 2) Take your medicines as prescribed 3) Eat low salt foods--Limit salt (sodium) to 2000mg  per day.  Stay as active as you can everyday

## 2011-06-26 ENCOUNTER — Encounter: Payer: Self-pay | Admitting: Internal Medicine

## 2011-06-26 ENCOUNTER — Ambulatory Visit (INDEPENDENT_AMBULATORY_CARE_PROVIDER_SITE_OTHER): Payer: Medicare Other | Admitting: Internal Medicine

## 2011-06-26 ENCOUNTER — Telehealth (HOSPITAL_COMMUNITY): Payer: Self-pay | Admitting: *Deleted

## 2011-06-26 VITALS — BP 90/64 | HR 86 | Ht 62.0 in | Wt 139.0 lb

## 2011-06-26 DIAGNOSIS — Z95810 Presence of automatic (implantable) cardiac defibrillator: Secondary | ICD-10-CM

## 2011-06-26 DIAGNOSIS — I5022 Chronic systolic (congestive) heart failure: Secondary | ICD-10-CM

## 2011-06-26 DIAGNOSIS — I472 Ventricular tachycardia: Secondary | ICD-10-CM

## 2011-06-26 LAB — ICD DEVICE OBSERVATION
BRDY-0002RV: 45 {beats}/min
CHARGE TIME: 11 s
DEV-0020ICD: NEGATIVE
HV IMPEDENCE: 46 Ohm
RV LEAD IMPEDENCE ICD: 522 Ohm

## 2011-06-26 MED ORDER — SULFAMETHOXAZOLE-TRIMETHOPRIM 800-160 MG PO TABS: 1.0000 | ORAL_TABLET | Freq: Two times a day (BID) | ORAL | Status: AC

## 2011-06-26 NOTE — Telephone Encounter (Signed)
Still unable to reach pt regarding UA results, left mess on ID VM that med was sent into [facility] for her to call back and let me know that she received the mess

## 2011-06-26 NOTE — Patient Instructions (Signed)
Your physician wants you to follow-up in: 12 months with Dr Taylor You will receive a reminder letter in the mail two months in advance. If you don't receive a letter, please call our office to schedule the follow-up appointment.   Remote monitoring is used to monitor your Pacemaker of ICD from home. This monitoring reduces the number of office visits required to check your device to one time per year. It allows us to keep an eye on the functioning of your device to ensure it is working properly. You are scheduled for a device check from home on 09/27/11. You may send your transmission at any time that day. If you have a wireless device, the transmission will be sent automatically. After your physician reviews your transmission, you will receive a postcard with your next transmission date.   

## 2011-06-27 ENCOUNTER — Encounter: Payer: Self-pay | Admitting: Internal Medicine

## 2011-06-27 NOTE — Progress Notes (Signed)
HPI Mrs. Kettlewell returns today for followup. She is a pleasant middle aged woman with a h/o ICM, chronic systolic heart failure, ventricular tachycardia, s/p ICD implant. In the interim she has been stable except for ongoing class 3 CHF. She initially had problems with sodium indiscretion but appears now to be improved. She has had no recent ICD shocks. She weighs herself daily.  Allergies  Allergen Reactions  . Ramipril Cough  . Latex Rash     Current Outpatient Prescriptions  Medication Sig Dispense Refill  . aspirin 81 MG EC tablet Take 4 tablets (325 mg total) by mouth daily.  30 tablet    . digoxin (LANOXIN) 0.125 MG tablet Take 1 tablet (125 mcg total) by mouth daily.  30 tablet  6  . furosemide (LASIX) 40 MG tablet Take 40 mg by mouth 2 (two) times daily.       [name] glimepiride (AMARYL) 2 MG tablet TAKE ONE TABLET BY MOUTH EVERY DAY  30 tablet  6  . hydrOXYzine (ATARAX/VISTARIL) 10 MG tablet Take 10 mg by mouth as needed.      . lovastatin (MEVACOR) 40 MG tablet Take 1 tablet (40 mg total) by mouth at bedtime.  30 tablet  2  . magnesium oxide (MAG-OX) 400 MG tablet Take 400 mg by mouth daily.        . metolazone (ZAROXOLYN) 2.5 MG tablet Take 2.5 mg by mouth daily as needed. For fluid gain       . metoprolol succinate (TOPROL-XL) 25 MG 24 hr tablet Take 25 mg by mouth as needed.      . nitroGLYCERIN (NITROSTAT) 0.4 MG SL tablet Place 1 tablet (0.4 mg total) under the tongue every 5 (five) minutes as needed for chest pain.  25 tablet  3  . pantoprazole (PROTONIX) 40 MG tablet Take 1 tablet (40 mg total) by mouth daily.  30 tablet  2  . potassium chloride SA (K-DUR,KLOR-CON) 20 MEQ tablet Take 20 mEq by mouth daily.        . sertraline (ZOLOFT) 50 MG tablet Take 1 tablet (50 mg total) by mouth daily. Take 1/2 tablet for the first 5-7 days then increase to 1 tablet  30 tablet  6  . spironolactone (ALDACTONE) 25 MG tablet Take 1 tablet (25 mg total) by mouth daily.  30 tablet  2  .  sulfamethoxazole-trimethoprim (BACTRIM DS) 800-160 MG per tablet Take 1 tablet by mouth 2 (two) times daily.  6 tablet  0     Past Medical History  Diagnosis Date  . Chronic systolic heart failure     a.  secondary to severe ischemic cardiomyopathy with EF 20-25%;  b. s/p rimplantation of Biotronik single chamber ICD by Dr Ladona Ridgel 02/2008;  c.  Right heart cath 04/2007: RA 18, PA 56/30 (40), PCWP 36. FICK 3.89/2.06 Woods;  d. echo 9/09: Ef 20-25%, mild MR, mild LAE  . CAD (coronary artery disease)     a. s/p multiple PCIs; b. cath 10/07: LAD stent 40%, dx 80% ISR - tx with POBA; OM stent ok; ostial OM 60%; RCA occluded with L-R collats  . Tobacco abuse   . Alcohol abuse   . History of noncompliance with medical treatment   . Diabetes mellitus, type 2   . Dizziness   . HLD (hyperlipidemia)   . DVT (deep venous thrombosis)     coumadin  . Arterial embolism     s/p right iliofemoral embolectomy with fasciotomy  . V-tach   .  AICD (automatic cardioverter/defibrillator) present   . Angina   . Asthma   . Shortness of breath   . Sleep apnea   . CHF (congestive heart failure)   . GERD (gastroesophageal reflux disease)   . Anxiety   . Myocardial infarction   . COPD (chronic obstructive pulmonary disease)     ROS:   All systems reviewed and negative except as noted in the HPI.   Past Surgical History  Procedure Date  . Diagnostic laparoscopy   . Cardiac catheterization   . Insert / replace / remove pacemaker   . Tubal ligation      Family History  Problem Relation Age of Onset  . Coronary artery disease      FMILY HISTORY     History   Social History  . Marital Status: Divorced    Spouse Name: N/A    Number of Children: N/A  . Years of Education: N/A   Occupational History  . disabled    Social History Main Topics  . Smoking status: Current Some Day Smoker -- 0.2 packs/day for 21 years    Types: Cigarettes  . Smokeless tobacco: Never Used  . Alcohol Use: No  .  Drug Use: No  . Sexually Active: No   Other Topics Concern  . Not on file   Social History Narrative  . No narrative on file     BP 90/64  Pulse 86  Ht 5\' 2"  (1.575 m)  Wt 63.05 kg (139 lb)  BMI 25.42 kg/m2  Physical Exam:  Well appearing middle age woman, NAD HEENT: Unremarkable Neck:  7 cm JVD, no thyromegally Lymphatics:  No adenopathy Back:  No CVA tenderness Lungs:  Clear with no wheezes, or rhonchi. Rare basilar rales. HEART:  Regular rate rhythm, 2/6 systolic murmur at the left lower sternal border. S3 gallop. Abd:  soft, positive bowel sounds, no organomegally, no rebound, no guarding Ext:  2 plus pulses, no edema, no cyanosis, no clubbing Skin:  No rashes no nodules Neuro:  CN II through XII intact, motor grossly intact  DEVICE  Normal device function.  See PaceArt for details.   Assess/Plan:

## 2011-06-27 NOTE — Assessment & Plan Note (Signed)
Her symptoms were class 2-3. She will continue her current meds.

## 2011-06-27 NOTE — Assessment & Plan Note (Signed)
Her device is working normally. Will follow. 

## 2011-06-27 NOTE — Assessment & Plan Note (Signed)
She has had no recurrent VT since her last visit. Will follow.  

## 2011-07-08 NOTE — Progress Notes (Signed)
Patient ID: Jessica [name], female   DOB: 1960/07/31, 51 y.o.   MRN: [id number] HPI:  [name] is a 51 y.o. female with a history of severe congestive heart failure, secondary ischemic cardiomyopathy with an ejection fraction of 25-30%. She also has a history of coronary artery disease, status post multiple interventions, ICD implantation, diabetes, hyperlipidemia, history of ongoing tobacco and possibly alcohol use.  Status post right iliofemoral embolectomy and compartment fasciotomy of the right leg by Dr. Darrick Penna with LLE DVT 06/2010.    She has a history of noncompliance.  She has inolerance of ACE-I due to severe cough.  Problem tolerating b-blocker and ARB due to low BP.    Cath 12/12: showed severe 3 vessel CAD with occlusion of multiple stents, EF 10-15%  12/12 R heart Cath: RA = 18, RV = 67/10/20, PA = 56/30 (41), PCW = 23, Ao = 97/72 (83), LV = 93/20/30, Fick cardiac output/index = 2.8/1.7, PVR = 6.5 Woods  SVR = 1878, FA sat = 88%, PA sat = 42%, 46%,   Echo 12/13: Only basal function preserved. EF 20-25%.   Not a candidate for CABG. Discussion with patient about LVAD as an option, however patient declined.  Medical therapy continued.  Coumadin stopped for DVT due to noncompliance with 6 months of completed therapy.     She returns for follow up today. Reports being sick since Thursday with myalgias, decreased appetite, and vomiting. Patient has been taking fluids in, but not really eating much. Has not taken some medications the past two nights (Spiro, lovastatin, and glimeperide). Reports being out of her Sprio.  Denies dyspnea/PND/ orthopnea. + cough, abdominal distention, and SOB with exertion. Reports urine cloudy with a foul odor. Weighing daily 135-142. Blood pressure at home 90-100/ 80s. Still smoking about 3 cigarettes a day, however none since Thursday.  ROS: All systems negative except as listed in HPI, PMH and Problem List.  Past Medical History  Diagnosis Date  . Chronic  systolic heart failure     a.  secondary to severe ischemic cardiomyopathy with EF 20-25%;  b. s/p rimplantation of Biotronik single chamber ICD by Dr Ladona Ridgel 02/2008;  c.  Right heart cath 04/2007: RA 18, PA 56/30 (40), PCWP 36. FICK 3.89/2.06 Woods;  d. echo 9/09: Ef 20-25%, mild MR, mild LAE  . CAD (coronary artery disease)     a. s/p multiple PCIs; b. cath 10/07: LAD stent 40%, dx 80% ISR - tx with POBA; OM stent ok; ostial OM 60%; RCA occluded with L-R collats  . Tobacco abuse   . Alcohol abuse   . History of noncompliance with medical treatment   . Diabetes mellitus, type 2   . Dizziness   . HLD (hyperlipidemia)   . DVT (deep venous thrombosis)     coumadin  . Arterial embolism     s/p right iliofemoral embolectomy with fasciotomy  . V-tach   . AICD (automatic cardioverter/defibrillator) present   . Angina   . Asthma   . Shortness of breath   . Sleep apnea   . CHF (congestive heart failure)   . GERD (gastroesophageal reflux disease)   . Anxiety   . Myocardial infarction   . COPD (chronic obstructive pulmonary disease)     Current Outpatient Prescriptions  Medication Sig Dispense Refill  . aspirin 81 MG EC tablet Take 4 tablets (325 mg total) by mouth daily.  30 tablet    . digoxin (LANOXIN) 0.125 MG tablet Take 1 tablet (125 mcg total)  by mouth daily.  30 tablet  6  . furosemide (LASIX) 40 MG tablet Take 40 mg by mouth 2 (two) times daily.       [name] glimepiride (AMARYL) 2 MG tablet TAKE ONE TABLET BY MOUTH EVERY DAY  30 tablet  6  . lovastatin (MEVACOR) 40 MG tablet Take 1 tablet (40 mg total) by mouth at bedtime.  30 tablet  2  . magnesium oxide (MAG-OX) 400 MG tablet Take 400 mg by mouth daily.        . metolazone (ZAROXOLYN) 2.5 MG tablet Take 2.5 mg by mouth daily as needed. For fluid gain       . nitroGLYCERIN (NITROSTAT) 0.4 MG SL tablet Place 1 tablet (0.4 mg total) under the tongue every 5 (five) minutes as needed for chest pain.  25 tablet  3  . pantoprazole (PROTONIX)  40 MG tablet Take 1 tablet (40 mg total) by mouth daily.  30 tablet  2  . potassium chloride SA (K-DUR,KLOR-CON) 20 MEQ tablet Take 20 mEq by mouth daily.        . sertraline (ZOLOFT) 50 MG tablet Take 1 tablet (50 mg total) by mouth daily. Take 1/2 tablet for the first 5-7 days then increase to 1 tablet  30 tablet  6  . spironolactone (ALDACTONE) 25 MG tablet Take 1 tablet (25 mg total) by mouth daily.  30 tablet  2  . hydrOXYzine (ATARAX/VISTARIL) 10 MG tablet Take 10 mg by mouth as needed.      . metoprolol succinate (TOPROL-XL) 25 MG 24 hr tablet Take 25 mg by mouth as needed.         PHYSICAL EXAM: Filed Vitals:   06/25/11 1115  BP: 80/52  Pulse: 87  Weight: 140 lb 8 oz (63.73 kg)  SpO2: 93%  Weight: 140 (134 lbs 3/7) Doppler SBP 85 General: Ill appearing.  No resp difficulty HEENT: normal Neck: supple. JVP 7-8 . + HJR, Carotids 2+ bilaterally; no bruits. No lymphadenopathy or thryomegaly appreciated. Cor: PMI laterally displaced. Regular rate & rhythm. No rubs, or murmurs.soft  S3  Lungs: Clear in upper bases; inspiratory wheezes in RLL Abdomen: soft, nontender, moderate distention.  No hepatosplenomegaly. No bruits or masses. Good bowel sounds. Extremities: no cyanosis, clubbing, rash, R mid thigh knot palpated no change from previous appointment; no LEE  Neuro: alert & orientedx3, cranial nerves grossly intact. Moves all 4 extremities w/o difficulty. Affect pleasant.  ASSESSMENT & PLAN:

## 2011-07-08 NOTE — Assessment & Plan Note (Signed)
Despite very severe 3v CAD remains stable. No evidence of ischemia. Continue current regimen.

## 2011-07-26 ENCOUNTER — Ambulatory Visit (HOSPITAL_COMMUNITY): Payer: Medicare Other | Attending: Internal Medicine

## 2011-07-31 ENCOUNTER — Other Ambulatory Visit (HOSPITAL_COMMUNITY): Payer: Self-pay | Admitting: Internal Medicine

## 2011-09-27 ENCOUNTER — Ambulatory Visit (INDEPENDENT_AMBULATORY_CARE_PROVIDER_SITE_OTHER): Payer: Medicare Other | Admitting: *Deleted

## 2011-09-27 ENCOUNTER — Inpatient Hospital Stay (HOSPITAL_COMMUNITY)
Admission: AD | Admit: 2011-09-27 | Discharge: 2011-10-04 | DRG: 292 | Disposition: A | Discharge status: Home / Self Care | Payer: Medicare Other | Source: Ambulatory Visit | Attending: Cardiology | Admitting: Cardiology

## 2011-09-27 ENCOUNTER — Encounter (HOSPITAL_COMMUNITY): Payer: Self-pay | Admitting: General Practice

## 2011-09-27 ENCOUNTER — Encounter: Payer: Self-pay | Admitting: Internal Medicine

## 2011-09-27 ENCOUNTER — Encounter (HOSPITAL_COMMUNITY): Payer: Self-pay

## 2011-09-27 ENCOUNTER — Ambulatory Visit (HOSPITAL_BASED_OUTPATIENT_CLINIC_OR_DEPARTMENT_OTHER)
Admission: RE | Admit: 2011-09-27 | Discharge: 2011-09-27 | Disposition: A | Discharge status: Home / Self Care | Payer: Medicare Other | Source: Ambulatory Visit | Attending: Internal Medicine | Admitting: Internal Medicine

## 2011-09-27 VITALS — BP 98/56 | HR 91 | Wt 149.5 lb

## 2011-09-27 DIAGNOSIS — N189 Chronic kidney disease, unspecified: Secondary | ICD-10-CM

## 2011-09-27 DIAGNOSIS — I2589 Other forms of chronic ischemic heart disease: Secondary | ICD-10-CM | POA: Diagnosis present

## 2011-09-27 DIAGNOSIS — Z9119 Patient's noncompliance with other medical treatment and regimen: Secondary | ICD-10-CM

## 2011-09-27 DIAGNOSIS — I5023 Acute on chronic systolic (congestive) heart failure: Secondary | ICD-10-CM

## 2011-09-27 DIAGNOSIS — I252 Old myocardial infarction: Secondary | ICD-10-CM

## 2011-09-27 DIAGNOSIS — I509 Heart failure, unspecified: Secondary | ICD-10-CM | POA: Diagnosis present

## 2011-09-27 DIAGNOSIS — E785 Hyperlipidemia, unspecified: Secondary | ICD-10-CM | POA: Diagnosis present

## 2011-09-27 DIAGNOSIS — I498 Other specified cardiac arrhythmias: Secondary | ICD-10-CM | POA: Diagnosis present

## 2011-09-27 DIAGNOSIS — I5022 Chronic systolic (congestive) heart failure: Secondary | ICD-10-CM

## 2011-09-27 DIAGNOSIS — Z95810 Presence of automatic (implantable) cardiac defibrillator: Secondary | ICD-10-CM

## 2011-09-27 DIAGNOSIS — E119 Type 2 diabetes mellitus without complications: Secondary | ICD-10-CM | POA: Diagnosis present

## 2011-09-27 DIAGNOSIS — N179 Acute kidney failure, unspecified: Secondary | ICD-10-CM | POA: Diagnosis present

## 2011-09-27 DIAGNOSIS — J4489 Other specified chronic obstructive pulmonary disease: Secondary | ICD-10-CM | POA: Diagnosis present

## 2011-09-27 DIAGNOSIS — Z91199 Patient's noncompliance with other medical treatment and regimen due to unspecified reason: Secondary | ICD-10-CM

## 2011-09-27 DIAGNOSIS — F1010 Alcohol abuse, uncomplicated: Secondary | ICD-10-CM | POA: Diagnosis present

## 2011-09-27 DIAGNOSIS — I2510 Atherosclerotic heart disease of native coronary artery without angina pectoris: Secondary | ICD-10-CM

## 2011-09-27 DIAGNOSIS — Z9861 Coronary angioplasty status: Secondary | ICD-10-CM

## 2011-09-27 DIAGNOSIS — F17200 Nicotine dependence, unspecified, uncomplicated: Secondary | ICD-10-CM

## 2011-09-27 DIAGNOSIS — I472 Ventricular tachycardia: Secondary | ICD-10-CM

## 2011-09-27 DIAGNOSIS — J449 Chronic obstructive pulmonary disease, unspecified: Secondary | ICD-10-CM | POA: Diagnosis present

## 2011-09-27 LAB — COMPREHENSIVE METABOLIC PANEL
ALT: 12 U/L (ref 0–35)
Calcium: 9.7 mg/dL (ref 8.4–10.5)
Creatinine, Ser: 1 mg/dL (ref 0.50–1.10)
GFR calc Af Amer: 73 mL/min — ABNORMAL LOW (ref >90)
Glucose, Bld: 158 mg/dL — ABNORMAL HIGH (ref 70–99)
Sodium: 135 mEq/L (ref 135–145)
Total Protein: 8.4 g/dL — ABNORMAL HIGH (ref 6.0–8.3)

## 2011-09-27 LAB — CBC
HCT: 38.5 % (ref 36.0–46.0)
MCHC: 34.3 g/dL (ref 30.0–36.0)
Platelets: 196 10*3/uL (ref 150–400)
RDW: 14.5 % (ref 11.5–15.5)
WBC: 6.2 10*3/uL (ref 4.0–10.5)

## 2011-09-27 LAB — DIGOXIN LEVEL: Digoxin Level: <0.3 ng/mL — ABNORMAL LOW (ref 0.8–2.0)

## 2011-09-27 LAB — ICD DEVICE OBSERVATION
BATTERY VOLTAGE: 3.11 V
HV IMPEDENCE: 46 Ohm
RV LEAD AMPLITUDE: 17.6 mv
RV LEAD IMPEDENCE ICD: 522 Ohm
VENTRICULAR PACING ICD: 0 pct

## 2011-09-27 LAB — MAGNESIUM: Magnesium: 1.6 mg/dL (ref 1.5–2.5)

## 2011-09-27 MED ORDER — SODIUM CHLORIDE 0.9 % IJ SOLN
3.0000 mL | Freq: Two times a day (BID) | INTRAMUSCULAR | Status: DC
  Administered 2011-09-27 – 2011-10-04 (×12): 3 mL via INTRAVENOUS

## 2011-09-27 MED ORDER — SPIRONOLACTONE 25 MG PO TABS
25.0000 mg | ORAL_TABLET | Freq: Every day | ORAL | Status: DC
  Administered 2011-09-27 – 2011-10-04 (×8): 25 mg via ORAL
  Filled 2011-09-27 (×8): qty 1

## 2011-09-27 MED ORDER — SERTRALINE HCL 50 MG PO TABS
50.0000 mg | ORAL_TABLET | Freq: Every day | ORAL | Status: DC
  Administered 2011-09-27 – 2011-10-04 (×8): 50 mg via ORAL
  Filled 2011-09-27 (×8): qty 1

## 2011-09-27 MED ORDER — ASPIRIN 81 MG PO TBEC: 81.0000 mg | DELAYED_RELEASE_TABLET | Freq: Every day | ORAL | Status: DC

## 2011-09-27 MED ORDER — SODIUM CHLORIDE 0.9 % IV SOLN: 250.0000 mL | INTRAVENOUS | Status: DC | PRN

## 2011-09-27 MED ORDER — POTASSIUM CHLORIDE CRYS ER 20 MEQ PO TBCR: 40.0000 meq | EXTENDED_RELEASE_TABLET | Freq: Two times a day (BID) | ORAL | Status: DC

## 2011-09-27 MED ORDER — ALBUTEROL SULFATE (5 MG/ML) 0.5% IN NEBU: 2.5000 mg | INHALATION_SOLUTION | RESPIRATORY_TRACT | Status: DC | PRN

## 2011-09-27 MED ORDER — ASPIRIN EC 81 MG PO TBEC
81.0000 mg | DELAYED_RELEASE_TABLET | Freq: Every day | ORAL | Status: DC
  Administered 2011-09-27 – 2011-10-04 (×8): 81 mg via ORAL
  Filled 2011-09-27 (×8): qty 1

## 2011-09-27 MED ORDER — GLIMEPIRIDE 2 MG PO TABS
2.0000 mg | ORAL_TABLET | Freq: Every day | ORAL | Status: DC
  Administered 2011-09-28 – 2011-10-03 (×5): 2 mg via ORAL
  Filled 2011-09-27 (×7): qty 1

## 2011-09-27 MED ORDER — ONDANSETRON HCL 4 MG/2ML IJ SOLN
4.0000 mg | Freq: Four times a day (QID) | INTRAMUSCULAR | Status: DC | PRN
  Administered 2011-10-03: 4 mg via INTRAVENOUS
  Filled 2011-09-27: qty 2

## 2011-09-27 MED ORDER — MAGNESIUM OXIDE 400 MG PO TABS
400.0000 mg | ORAL_TABLET | Freq: Every day | ORAL | Status: DC
  Administered 2011-09-27 – 2011-10-04 (×8): 400 mg via ORAL
  Filled 2011-09-27 (×9): qty 1

## 2011-09-27 MED ORDER — FUROSEMIDE 10 MG/ML IJ SOLN
80.0000 mg | Freq: Two times a day (BID) | INTRAMUSCULAR | Status: DC
  Administered 2011-09-27 – 2011-09-30 (×6): 80 mg via INTRAVENOUS
  Filled 2011-09-27 (×8): qty 8

## 2011-09-27 MED ORDER — PANTOPRAZOLE SODIUM 40 MG PO TBEC
40.0000 mg | DELAYED_RELEASE_TABLET | Freq: Every day | ORAL | Status: DC
  Administered 2011-09-27 – 2011-10-04 (×8): 40 mg via ORAL
  Filled 2011-09-27 (×7): qty 1

## 2011-09-27 MED ORDER — SIMVASTATIN 20 MG PO TABS
20.0000 mg | ORAL_TABLET | Freq: Every day | ORAL | Status: DC
  Administered 2011-09-27 – 2011-10-03 (×7): 20 mg via ORAL
  Filled 2011-09-27 (×8): qty 1

## 2011-09-27 MED ORDER — HEPARIN SODIUM (PORCINE) 5000 UNIT/ML IJ SOLN
5000.0000 [IU] | Freq: Three times a day (TID) | INTRAMUSCULAR | Status: DC
  Administered 2011-09-27 – 2011-10-04 (×20): 5000 [IU] via SUBCUTANEOUS
  Filled 2011-09-27 (×24): qty 1

## 2011-09-27 MED ORDER — SODIUM CHLORIDE 0.9 % IJ SOLN
3.0000 mL | INTRAMUSCULAR | Status: DC | PRN
  Administered 2011-10-01: 3 mL via INTRAVENOUS

## 2011-09-27 MED ORDER — POTASSIUM CHLORIDE CRYS ER 20 MEQ PO TBCR
40.0000 meq | EXTENDED_RELEASE_TABLET | Freq: Every day | ORAL | Status: DC
  Administered 2011-09-27: 40 meq via ORAL
  Filled 2011-09-27 (×2): qty 2

## 2011-09-27 MED ORDER — ACETAMINOPHEN 325 MG PO TABS
650.0000 mg | ORAL_TABLET | ORAL | Status: DC | PRN
  Administered 2011-09-28 (×2): 650 mg via ORAL
  Filled 2011-09-27 (×2): qty 2

## 2011-09-27 NOTE — Progress Notes (Signed)
Utilization review completed.  

## 2011-09-27 NOTE — Patient Instructions (Addendum)
We will admit you to the hospital today, they will call you when a room is available

## 2011-09-27 NOTE — Assessment & Plan Note (Signed)
Patient presents will massive volume overload on board despite lasix 40 mg daily and daily metolazone 2.5 mg.  Will admit for optimization with IV diuresis.  Patient is agreeable to this plan.  See Hospital H&P for full plan.

## 2011-09-27 NOTE — Progress Notes (Signed)
ICD check by industry for research 

## 2011-09-27 NOTE — H&P (Signed)
Advanced Heart Failure Team History and Physical Note   Primary Physician: None Primary Cardiologist: Dr. Gala Romney  Reason for Admission: Massive volume overload in setting of systolic heart failure  Baseline proBNP: 2212 on 02/2011 Weight Range: 133-134 pounds  HPI:    Jessica [name] is a 51 y.o. female with a history of severe systolic heart failure, secondary ischemic cardiomyopathy with an ejection fraction of 20-25%. She also has a history of coronary artery disease, status post multiple interventions, ICD implantation, diabetes, hyperlipidemia, history of ongoing tobacco and possibly alcohol use. Status post right iliofemoral embolectomy and compartment fasciotomy of the right leg by Dr. Darrick Penna with LLE DVT 06/2010.  Coumadin was discontinued due to noncompliance but she did receive 6 months of therapy.  She has a history of noncompliance. She has inolerance of ACE-I due to severe cough. Problem tolerating b-blocker and ARB due to low BP.   Last cardiac cath in 02/2011 showed severe 3 vessel CAD with occlusion of multiple stents, EF 10-15%.  She was evaluated by Dr. Maren Beach for CABG but felt not to be a candidate.  Right heart cath at the time showed  RA = 18, RV = 67/10/20, PA = 56/30 (41), PCW = 23, Ao = 97/72 (83), LV = 93/20/30, Fick cardiac output/index = 2.8/1.7, PVR = 6.5 Woods with SVR = 1878, FA sat = 88%, PA sat = 42%, 46%.  There was discussion with the patient concerning LVAD as an option, however patient declined. Medical therapy continued.   She was evaluated in the HF clinic as a work in visit today for increased volume overload.  She had missed her last follow up appointment and since that time noted increased fluid accumulation.  She feels that she has been struggling over the last month.  She complains of cough, abdominal distention, orthopnea and PND.  She has tried to take care of her weight at home by taking metolazone daily as well as continued her lasix.  Her weight remains up  despite increased diuretic use.  She will therefore be admitted to the hospital from the clinic for IV diuresis. She is agreeable to this plan.  No ICD shocks.    Review of Systems: [y] = yes, [ ]  = no   General: Weight gain Cove.Etienne ]; Weight loss [ ] ; Anorexia [ ] ; Fatigue Cove.Etienne ]; Fever [ ] ; Chills [ ] ; Weakness [ ]   Cardiac: Chest pain/pressure [ ] ; Resting SOB [ ] ; Exertional SOB Cove.Etienne ]; Orthopnea Cove.Etienne ]; Pedal Edema [ ] ; Palpitations [ ] ; Syncope [ ] ; Presyncope [ ] ; Paroxysmal nocturnal dyspnea[y ]  Pulmonary: Cough [ y]; Wheezing[ ] ; Hemoptysis[ ] ; Sputum [ ] ; Snoring [ ]   GI: Vomiting[ ] ; Dysphagia[ ] ; Melena[ ] ; Hematochezia [ ] ; Heartburn[ ] ; Abdominal pain [ ] ; Constipation [ ] ; Diarrhea [ ] ; BRBPR [ ]   GU: Hematuria[ ] ; Dysuria [ ] ; Nocturia[ ]   Vascular: Pain in legs with walking [ ] ; Pain in feet with lying flat [ ] ; Non-healing sores [ ] ; Stroke [ ] ; TIA [ ] ; Slurred speech [ ] ;  Neuro: Headaches[ ] ; Vertigo[ ] ; Seizures[ ] ; Paresthesias[ ] ;Blurred vision [ ] ; Diplopia [ ] ; Vision changes [ ]   Ortho/Skin: Arthritis [ ] ; Joint pain [ ] ; Muscle pain [ ] ; Joint swelling [ ] ; Back Pain [ ] ; Rash [ ]   Psych: Depression[ y]; Anxiety[ ]   Heme: Bleeding problems [ ] ; Clotting disorders [ ] ; Anemia [ ]   Endocrine: Diabetes [ ] ; Thyroid dysfunction[ ]   Home Medications Prior  to Admission medications   Medication Sig Start Date End Date Taking? Authorizing Provider  albuterol (PROVENTIL HFA;VENTOLIN HFA) 108 (90 BASE) MCG/ACT inhaler Inhale 2 puffs into the lungs every 6 (six) hours as needed. For shortness of breath   Yes Historical Provider, MD  albuterol (PROVENTIL) (2.5 MG/3ML) 0.083% nebulizer solution Take 2.5 mg by nebulization every 4 (four) hours as needed. For shortness of breath   Yes Historical Provider, MD  aspirin 81 MG EC tablet Take 81 mg by mouth daily. 02/23/11 02/23/12 Yes Hadassah Pais, PA  digoxin (LANOXIN) 0.125 MG tablet Take 125 mcg by mouth daily. 02/16/11 02/16/12 Yes Amy D  Clegg, NP  furosemide (LASIX) 40 MG tablet Take 40 mg by mouth 2 (two) times daily.    Yes Historical Provider, MD  glimepiride (AMARYL) 2 MG tablet Take 2 mg by mouth daily before breakfast.   Yes Historical Provider, MD  lovastatin (MEVACOR) 40 MG tablet Take 40 mg by mouth at bedtime. 01/30/11 01/30/12 Yes Bevelyn Buckles Bensimhon, MD  magnesium oxide (MAG-OX) 400 MG tablet Take 400 mg by mouth daily.     Yes Historical Provider, MD  metolazone (ZAROXOLYN) 2.5 MG tablet Take 2.5 mg by mouth daily as needed. For fluid gain; has been taking daily.   Yes Historical Provider, MD  nitroGLYCERIN (NITROSTAT) 0.4 MG SL tablet Place 0.4 mg under the tongue every 5 (five) minutes as needed. For chest pain 02/23/11 02/23/12 Yes Hadassah Pais, PA  pantoprazole (PROTONIX) 40 MG tablet Take 40 mg by mouth daily.   Yes Historical Provider, MD  potassium chloride SA (K-DUR,KLOR-CON) 20 MEQ tablet Take 20 mEq by mouth daily.     Yes Historical Provider, MD  sertraline (ZOLOFT) 50 MG tablet Take 50 mg by mouth daily. Take 1/2 tablet for the first 5-7 days then increase to 1 tablet 04/19/11 04/18/12 Yes Hadassah Pais, PA  spironolactone (ALDACTONE) 25 MG tablet Take 25 mg by mouth daily.   Yes Historical Provider, MD    Past Medical History: Past Medical History  Diagnosis Date  . Chronic systolic heart failure     a.  secondary to severe ischemic cardiomyopathy with EF 20-25%;  b. s/p rimplantation of Biotronik single chamber ICD by Dr Ladona Ridgel 02/2008;  c.  Right heart cath 04/2007: RA 18, PA 56/30 (40), PCWP 36. FICK 3.89/2.06 Woods;  d. echo 9/09: Ef 20-25%, mild MR, mild LAE  . CAD (coronary artery disease)     a. s/p multiple PCIs; b. cath 10/07: LAD stent 40%, dx 80% ISR - tx with POBA; OM stent ok; ostial OM 60%; RCA occluded with L-R collats  . Tobacco abuse   . Alcohol abuse   . History of noncompliance with medical treatment   . Diabetes mellitus, type 2   . Dizziness   . HLD (hyperlipidemia)   . DVT  (deep venous thrombosis)     coumadin  . Arterial embolism     s/p right iliofemoral embolectomy with fasciotomy  . V-tach   . AICD (automatic cardioverter/defibrillator) present   . Angina   . Asthma   . Shortness of breath   . Sleep apnea   . CHF (congestive heart failure)   . GERD (gastroesophageal reflux disease)   . Anxiety   . Myocardial infarction   . COPD (chronic obstructive pulmonary disease)     Past Surgical History: Past Surgical History  Procedure Date  . Diagnostic laparoscopy   . Cardiac catheterization   . Insert /  replace / remove pacemaker   . Tubal ligation     Family History: Family History  Problem Relation Age of Onset  . Coronary artery disease      FMILY HISTORY    Social History: History   Social History  . Marital Status: Divorced    Spouse Name: N/A    Number of Children: N/A  . Years of Education: N/A   Occupational History  . disabled    Social History Main Topics  . Smoking status: Current Some Day Smoker -- 0.2 packs/day for 21 years    Types: Cigarettes  . Smokeless tobacco: Never Used  . Alcohol Use: No  . Drug Use: No  . Sexually Active: No   Other Topics Concern  . None   Social History Narrative  . None    Allergies:  Allergies  Allergen Reactions  . Ramipril Cough  . Latex Rash    Objective:    Vital Signs:   Temp:  [97.6 F (36.4 C)] 97.6 F (36.4 C) (07/18 1321) Pulse Rate:  [91-92] 92  (07/18 1321) Resp:  [22] 22  (07/18 1321) BP: (98-104)/(56-73) 104/73 mmHg (07/18 1321) SpO2:  [93 %-97 %] 97 % (07/18 1321) Weight:  [149 lb 4 oz (67.7 kg)-149 lb 8 oz (67.813 kg)] 149 lb 4 oz (67.7 kg) (07/18 1321) Last BM Date: 09/26/11 Filed Weights   09/27/11 1321  Weight: 149 lb 4 oz (67.7 kg)    Physical Exam: General: Ill appearing. No resp difficulty  HEENT: normal  Neck: supple. JVP jaw. + HJR, Carotids 2+ bilaterally; no bruits. No lymphadenopathy or thryomegaly appreciated.  Cor: PMI laterally  displaced. Regular rate & rhythm. No murmurs. Soft S3  Lungs: Crackles bilateral bases  Abdomen: soft, nontender, moderate distention. No hepatosplenomegaly. No bruits or masses. Good bowel sounds.  Extremities: no cyanosis, clubbing, rash, trace bilateral LE edema,  Neuro: alert & orientedx3, cranial nerves grossly intact. Moves all 4 extremities w/o difficulty. Affect pleasant   Assessment:   1. Acute on Chronic Systolic HF  2. ICM, EF 10%  3. 3-V CAD with multiple stent occlusions     - not a CABG candidate  4. DM  5. Continued tobacco abuse  6. History of DVT, April 2012      - 6 months of coumadin use  Plan/Discussion:   Charlynn Court will be admitted for volume overload in the setting of severe systolic heart failure.  She will require IV diuresis as she is ~15 pounds above baseline.  Will hold off on inotrope support at this time but if UOP is sluggish will add milrinone to assist with cardiac output.  Will hold digoxin at this time as milrinone may be initiated.  No ACE-I/ARB or beta blocker due to hypotension.  Will continue spironolactone at this time.  Check BMET, mag, dig level.    Robbi Garter, [facility] 3:51 PM  Attending Note:   The patient was seen and examined.  Agree with assessment and plan as noted above.  Very nice lady - difficult problem.  Massive volume overload - primarily in the abdomen.   + S3 on exam.  She has not responded to the PO Lasix and daily metolazone.   She has multiple open sores on her back, arms and legs.  Agree with admission and IV Lasix.   Will start Milrinone if she does not make progress.  She needs to stop smoking.   Vesta Mixer, Montez Hageman., MD, [facility] 09/27/2011, 4:17 PM

## 2011-09-27 NOTE — Progress Notes (Signed)
HPI:  Jessica [name] is a 51 y.o. female with a history of severe congestive heart failure, secondary ischemic cardiomyopathy with an ejection fraction of 25-30%. She also has a history of coronary artery disease, status post multiple interventions, ICD implantation, diabetes, hyperlipidemia, history of ongoing tobacco and possibly alcohol use.  Status post right iliofemoral embolectomy and compartment fasciotomy of the right leg by Dr. Darrick Penna with LLE DVT 06/2010.    She has a history of noncompliance.  She has inolerance of ACE-I due to severe cough.  Problem tolerating b-blocker and ARB due to low BP.    Cath 02/2011: showed severe 3 vessel CAD with occlusion of multiple stents, EF 10-15%  02/2011 R heart Cath: RA = 18, RV = 67/10/20, PA = 56/30 (41), PCW = 23, Ao = 97/72 (83), LV = 93/20/30, Fick cardiac output/index = 2.8/1.7, PVR = 6.5 Woods  SVR = 1878, FA sat = 88%, PA sat = 42%, 46%,   Echo 02/22/11: Only basal function preserved. EF 20-25%.   Not a candidate for CABG. Discussion with patient about LVAD as an option, however patient declined.  Medical therapy continued.  Coumadin stopped for DVT due to noncompliance with 6 months of completed therapy.     She is her for a work in visit today.   Missed her appointment last month, has been struggling over the last month.  +cough.  +abdominal distention.  Has taken extra metolazone almost every day.  Weight went down to 144 but became dizzy now back up to 149 pounds.  Does not know of  +orthopnea/PND.  No shocks.    ROS: All systems negative except as listed in HPI, PMH and Problem List.  Past Medical History  Diagnosis Date  . Chronic systolic heart failure     a.  secondary to severe ischemic cardiomyopathy with EF 20-25%;  b. s/p rimplantation of Biotronik single chamber ICD by Dr Ladona Ridgel 02/2008;  c.  Right heart cath 04/2007: RA 18, PA 56/30 (40), PCWP 36. FICK 3.89/2.06 Woods;  d. echo 9/09: Ef 20-25%, mild MR, mild LAE  . CAD (coronary  artery disease)     a. s/p multiple PCIs; b. cath 10/07: LAD stent 40%, dx 80% ISR - tx with POBA; OM stent ok; ostial OM 60%; RCA occluded with L-R collats  . Tobacco abuse   . Alcohol abuse   . History of noncompliance with medical treatment   . Diabetes mellitus, type 2   . Dizziness   . HLD (hyperlipidemia)   . DVT (deep venous thrombosis)     coumadin  . Arterial embolism     s/p right iliofemoral embolectomy with fasciotomy  . V-tach   . AICD (automatic cardioverter/defibrillator) present   . Angina   . Asthma   . Shortness of breath   . Sleep apnea   . CHF (congestive heart failure)   . GERD (gastroesophageal reflux disease)   . Anxiety   . Myocardial infarction   . COPD (chronic obstructive pulmonary disease)     Current Outpatient Prescriptions  Medication Sig Dispense Refill  . aspirin 81 MG EC tablet Take 81 mg by mouth daily.      . digoxin (LANOXIN) 0.125 MG tablet Take 1 tablet (125 mcg total) by mouth daily.  30 tablet  6  . furosemide (LASIX) 40 MG tablet Take 40 mg by mouth 2 (two) times daily.       [name] glimepiride (AMARYL) 2 MG tablet TAKE ONE TABLET BY MOUTH EVERY  DAY  30 tablet  6  . hydrOXYzine (ATARAX/VISTARIL) 10 MG tablet Take 10 mg by mouth as needed.      . lovastatin (MEVACOR) 40 MG tablet Take 1 tablet (40 mg total) by mouth at bedtime.  30 tablet  2  . magnesium oxide (MAG-OX) 400 MG tablet Take 400 mg by mouth daily.        . metolazone (ZAROXOLYN) 2.5 MG tablet Take 2.5 mg by mouth daily as needed. For fluid gain; has been taking daily.      . metoprolol succinate (TOPROL-XL) 25 MG 24 hr tablet Take 25 mg by mouth as needed. Only takes if BP > 120      . nitroGLYCERIN (NITROSTAT) 0.4 MG SL tablet Place 1 tablet (0.4 mg total) under the tongue every 5 (five) minutes as needed for chest pain.  25 tablet  3  . pantoprazole (PROTONIX) 40 MG tablet TAKE ONE TABLET BY MOUTH EVERY DAY  30 tablet  6  . potassium chloride SA (K-DUR,KLOR-CON) 20 MEQ tablet  Take 20 mEq by mouth daily.        . sertraline (ZOLOFT) 50 MG tablet Take 1 tablet (50 mg total) by mouth daily. Take 1/2 tablet for the first 5-7 days then increase to 1 tablet  30 tablet  6  . spironolactone (ALDACTONE) 25 MG tablet TAKE ONE TABLET BY MOUTH EVERY DAY  30 tablet  6     PHYSICAL EXAM: Filed Vitals:   09/27/11 1023  BP: 98/56  Pulse: 91  Weight: 149 lb 8 oz (67.813 kg)  SpO2: 93%   General: Ill appearing.  No resp difficulty HEENT: normal Neck: supple. JVP jaw. + HJR, Carotids 2+ bilaterally; no bruits. No lymphadenopathy or thryomegaly appreciated. Cor: PMI laterally displaced. Regular rate & rhythm. No rubs, or murmurs.soft  S3  Lungs: Crackles bilateral bases Abdomen: soft, nontender, moderate distention.  No hepatosplenomegaly. No bruits or masses. Good bowel sounds. Extremities: no cyanosis, clubbing, rash, trace bilateral LE edema,  Neuro: alert & orientedx3, cranial nerves grossly intact. Moves all 4 extremities w/o difficulty. Affect pleasant.  ASSESSMENT & PLAN:

## 2011-09-28 ENCOUNTER — Inpatient Hospital Stay (HOSPITAL_COMMUNITY): Payer: Medicare Other

## 2011-09-28 LAB — BASIC METABOLIC PANEL
BUN: 26 mg/dL — ABNORMAL HIGH (ref 6–23)
Chloride: 93 mEq/L — ABNORMAL LOW (ref 96–112)
GFR calc Af Amer: 65 mL/min — ABNORMAL LOW (ref >90)
Potassium: 3.7 mEq/L (ref 3.5–5.1)
Sodium: 134 mEq/L — ABNORMAL LOW (ref 135–145)

## 2011-09-28 MED ORDER — POTASSIUM CHLORIDE CRYS ER 20 MEQ PO TBCR
40.0000 meq | EXTENDED_RELEASE_TABLET | Freq: Two times a day (BID) | ORAL | Status: DC
  Administered 2011-09-28 – 2011-10-03 (×11): 40 meq via ORAL
  Filled 2011-09-28 (×11): qty 2

## 2011-09-28 MED ORDER — MILRINONE IN DEXTROSE 200-5 MCG/ML-% IV SOLN
0.2500 ug/kg/min | INTRAVENOUS | Status: DC
  Administered 2011-09-28 – 2011-10-04 (×5): 0.25 ug/kg/min via INTRAVENOUS
  Filled 2011-09-28 (×7): qty 100

## 2011-09-28 NOTE — Progress Notes (Signed)
Advanced Heart Failure Rounding Note   Subjective:    51 yo with a history of severe systolic heart failure, secondary ischemic cardiomyopathy with an ejection fraction of 20-25%. She also has a history of coronary artery disease, status post multiple interventions, ICD implantation, diabetes, hyperlipidemia.  Admitted yesterday from clinic with ~15 pound.     -1.6 liters.  Feels better since admit.  +abdominal distention.  Dyspnea with exertion.    Objective:   Weight Range:  Vital Signs:   Temp:  [97.6 F (36.4 C)-98.1 F (36.7 C)] 97.7 F (36.5 C) (07/19 0430) Pulse Rate:  [68-101] 88  (07/19 0430) Resp:  [18-22] 18  (07/19 0430) BP: (90-104)/(56-73) 91/63 mmHg (07/19 0430) SpO2:  [93 %-99 %] 98 % (07/19 0430) Weight:  [149 lb 4 oz (67.7 kg)-150 lb 8 oz (68.266 kg)] 150 lb 8 oz (68.266 kg) (07/19 0430) Last BM Date: 09/26/11  Weight change: Filed Weights   09/27/11 1321 09/28/11 0430  Weight: 149 lb 4 oz (67.7 kg) 150 lb 8 oz (68.266 kg)    Intake/Output:   Intake/Output Summary (Last 24 hours) at 09/28/11 0805 Last data filed at 09/28/11 0431  Gross per 24 hour  Intake    363 ml  Output   1950 ml  Net  -1587 ml     Physical Exam: General: Ill appearing. No resp difficulty  HEENT: normal  Neck: supple. JVP 12-14. + HJR, Carotids 2+ bilaterally; no bruits. No lymphadenopathy or thryomegaly appreciated.  Cor: PMI laterally displaced. Regular rate & rhythm. No murmurs. Soft S3  Lungs: Clear  Abdomen: soft, nontender, moderate distention. No hepatosplenomegaly. No bruits or masses. Good bowel sounds.  Extremities: no cyanosis, clubbing, rash, trace bilateral LE edema,  Neuro: alert & orientedx3, cranial nerves grossly intact. Moves all 4 extremities w/o difficulty. Affect pleasant   Telemetry: NSR 80-100  Labs: Basic Metabolic Panel:  Lab 09/28/11 4696 09/27/11 1409  NA 134* 135  K 3.7 3.5  CL 93* 96  CO2 29 23  GLUCOSE 232* 158*  BUN 26* 20  CREATININE  1.10 1.00  CALCIUM 9.4 9.7  MG -- 1.6  PHOS -- --    Liver Function Tests:  Lab 09/27/11 1409  AST 24  ALT 12  ALKPHOS 134*  BILITOT 0.8  PROT 8.4*  ALBUMIN 3.6   No results found for this basename: LIPASE:5,AMYLASE:5 in the last 168 hours No results found for this basename: AMMONIA:3 in the last 168 hours  CBC:  Lab 09/27/11 1409  WBC 6.2  NEUTROABS --  HGB 13.2  HCT 38.5  MCV 83.2  PLT 196    Cardiac Enzymes: No results found for this basename: CKTOTAL:5,CKMB:5,CKMBINDEX:5,TROPONINI:5 in the last 168 hours  BNP: BNP (last 3 results)  Basename 09/27/11 1409 12/15/10 0107  PROBNP 2407.0* 2212.0*     Other results:  EKG:   Imaging: Dg Chest Port 1 View  09/28/2011  *RADIOLOGY REPORT*  Clinical Data: Dyspnea.  PORTABLE CHEST - 1 VIEW  Comparison: 12/15/2010.  Findings: AICD is in place with lead unchanged in position.  Cardiomegaly.  Pulmonary vascular prominence/mild congestion most notable centrally.  Increased markings lung bases may represent small infiltrates.  No gross pneumothorax.  Limited evaluation of the aorta.  IMPRESSION: Cardiomegaly.  Pulmonary vascular prominence/mild congestion most notable centrally.  Increased markings lung bases may represent small infiltrates.  Original Report Authenticated By: Fuller Canada, M.D.     Medications:     Scheduled Medications:    .  aspirin EC  81 mg Oral Daily  . furosemide  80 mg Intravenous BID  . glimepiride  2 mg Oral QAC breakfast  . heparin  5,000 Units Subcutaneous Q8H  . magnesium oxide  400 mg Oral Daily  . pantoprazole  40 mg Oral Daily  . potassium chloride  40 mEq Oral Daily  . sertraline  50 mg Oral Daily  . simvastatin  20 mg Oral q1800  . sodium chloride  3 mL Intravenous Q12H  . spironolactone  25 mg Oral Daily  . DISCONTD: aspirin  81 mg Oral Daily  . DISCONTD: potassium chloride  40 mEq Oral BID    Infusions:    . milrinone      PRN Medications: sodium chloride,  acetaminophen, albuterol, ondansetron (ZOFRAN) IV, sodium chloride   Assessment/Plan:   1. Acute on Chronic Systolic HF: Diuresing with IV lasix. SBP runs in 90s, suspect low output.  -Continue IV lasix 80 mg bid -Follow BUN/Cr closely, mild rise.  -She is volume overloaded and is going to need significant diuresis, 10 more lbs.  I will add milrinone 0.25 mcg/kg/min to support cardiac output.  2. ICM, EF 20%  3. 3-V CAD with multiple stent occlusions  - no ischemic symptoms - not a CABG candidate  4. DM  - continue SSI 5. Continued tobacco abuse  6. History of DVT, April 2012  - 6 months of coumadin use   Length of Stay: 1  [name] 09/28/2011 8:08 AM

## 2011-09-28 NOTE — Progress Notes (Signed)
Inpatient Diabetes Program Recommendations  AACE/ADA: New Consensus Statement on Inpatient Glycemic Control (2009)  Target Ranges:  Prepandial:   less than 140 mg/dL      Peak postprandial:   less than 180 mg/dL (1-2 hours)      Critically ill patients:  140 - 180 mg/dL   Reason for Visit: Note history of diabetes.  Lab glucose this morning was 232 mg/dL.  Please check CBG's tid with meals and HS.  Inpatient Diabetes Program Recommendations Correction (SSI): Please add Novolog correction moderate tid with meals. HgbA1C: Check A1C to determine prehospitalization glycemic control  Note: Will follow.

## 2011-09-29 DIAGNOSIS — Z95810 Presence of automatic (implantable) cardiac defibrillator: Secondary | ICD-10-CM

## 2011-09-29 DIAGNOSIS — I2510 Atherosclerotic heart disease of native coronary artery without angina pectoris: Secondary | ICD-10-CM

## 2011-09-29 LAB — BASIC METABOLIC PANEL
CO2: 28 mEq/L (ref 19–32)
Chloride: 97 mEq/L (ref 96–112)
Potassium: 4.1 mEq/L (ref 3.5–5.1)
Sodium: 138 mEq/L (ref 135–145)

## 2011-09-29 LAB — GLUCOSE, CAPILLARY
Glucose-Capillary: 278 mg/dL — ABNORMAL HIGH (ref 70–99)
Glucose-Capillary: 257 mg/dL — ABNORMAL HIGH (ref 70–99)
Glucose-Capillary: 100 mg/dL — ABNORMAL HIGH (ref 70–99)

## 2011-09-29 MED ORDER — INSULIN ASPART 100 UNIT/ML ~~LOC~~ SOLN
0.0000 [IU] | Freq: Every day | SUBCUTANEOUS | Status: DC
  Administered 2011-10-02: 3 [IU] via SUBCUTANEOUS
  Administered 2011-10-03: 4 [IU] via SUBCUTANEOUS

## 2011-09-29 MED ORDER — INSULIN ASPART 100 UNIT/ML ~~LOC~~ SOLN
0.0000 [IU] | Freq: Three times a day (TID) | SUBCUTANEOUS | Status: DC
  Administered 2011-09-29: 13:00:00 via SUBCUTANEOUS
  Administered 2011-09-30: 2 [IU] via SUBCUTANEOUS
  Administered 2011-09-30: 5 [IU] via SUBCUTANEOUS
  Administered 2011-10-01: 2 [IU] via SUBCUTANEOUS
  Administered 2011-10-01: 5 [IU] via SUBCUTANEOUS
  Administered 2011-10-01: 2 [IU] via SUBCUTANEOUS
  Administered 2011-10-02 (×2): 3 [IU] via SUBCUTANEOUS
  Administered 2011-10-02 – 2011-10-03 (×2): 5 [IU] via SUBCUTANEOUS
  Administered 2011-10-03: 3 [IU] via SUBCUTANEOUS
  Administered 2011-10-04: 8 [IU] via SUBCUTANEOUS
  Administered 2011-10-04: 2 [IU] via SUBCUTANEOUS

## 2011-09-29 NOTE — Progress Notes (Signed)
Patient had 6-beat run of v-tach. Patient alert and asymptomatic. Blood pressure 96/64, pulse 80, temperature 97.8 F (36.6 C), temperature source Oral, resp. rate 20, height 5\' 5"  (1.651 m), weight 67.6 kg (149 lb 0.5 oz), SpO2 98.00%. Strip reviewed and to paper chart.  Will continue to monitor.  Troy Sine

## 2011-09-29 NOTE — Progress Notes (Signed)
Pt BP 86/54 asymptomatic  MD made aware at the bedside, MD instructed to cont with scheduled lasix dose. Will cont to monitor.

## 2011-09-29 NOTE — Progress Notes (Signed)
   Subjective: Feels better, breathing nonlabored at rest. No chest pain.   Objective: Blood pressure 97/63, pulse 80, temperature 97.1 F (36.2 C), temperature source Oral, resp. rate 20, height 5\' 5"  (1.651 m), weight 149 lb 0.5 oz (67.6 kg), SpO2 96.00%.  Intake/Output Summary (Last 24 hours) at 09/29/11 0913 Last data filed at 09/29/11 0800  Gross per 24 hour  Intake   1078 ml  Output   2101 ml  Net  -1023 ml    Telemetry - Sinus with PVCs.  Exam -   General - NAD.  Lungs - Clear nonlabored.  Cardiac - RRR, PMI lateral, soft S3.  Extremities - Trace edema.  Lab Results -  Basic Metabolic Panel:  Lab 09/29/11 6045 09/28/11 0502 09/27/11 1409  NA 138 134* 135  K 4.1 3.7 3.5  CL 97 93* 96  CO2 28 29 23   GLUCOSE 221* 232* 158*  BUN 25* 26* 20  CREATININE 1.06 1.10 1.00  CALCIUM 9.4 9.4 9.7  MG -- -- 1.6    Liver Function Tests:  Lab 09/27/11 1409  AST 24  ALT 12  ALKPHOS 134*  BILITOT 0.8  PROT 8.4*  ALBUMIN 3.6    CBC:  Lab 09/27/11 1409  WBC 6.2  HGB 13.2  HCT 38.5  MCV 83.2  PLT 196   CXR 7/19: IMPRESSION:  Cardiomegaly.  Pulmonary vascular prominence/mild congestion most notable  centrally.  Increased markings lung bases may represent small infiltrates.   Current Medications    . aspirin EC  81 mg Oral Daily  . furosemide  80 mg Intravenous BID  . glimepiride  2 mg Oral QAC breakfast  . heparin  5,000 Units Subcutaneous Q8H  . magnesium oxide  400 mg Oral Daily  . pantoprazole  40 mg Oral Daily  . potassium chloride  40 mEq Oral BID  . sertraline  50 mg Oral Daily  . simvastatin  20 mg Oral q1800  . sodium chloride  3 mL Intravenous Q12H  . spironolactone  25 mg Oral Daily  . DISCONTD: potassium chloride  40 mEq Oral Daily    Assessment:  1. A/C systolic CHF, LVEF 20%. Low output, on Milrinone. Diuresing and clinically improving. Renal function is stable.  2. ICM with MVCAD s/p multiple PCIs. No active chest pain.  3.  DM2.  4. Tobacco abuse.  Plan:  Continue present medications including Milrinone and IV Lasix. Adel Heparin for DVT prophylaxis. Needs SSI as well. Up as tolerated. Followup BMET and HgbA1C.  Jonelle Sidle, M.D., F.A.C.C.

## 2011-09-30 LAB — GLUCOSE, CAPILLARY: Glucose-Capillary: 233 mg/dL — ABNORMAL HIGH (ref 70–99)

## 2011-09-30 LAB — BASIC METABOLIC PANEL
BUN: 27 mg/dL — ABNORMAL HIGH (ref 6–23)
CO2: 27 mEq/L (ref 19–32)
Calcium: 9.8 mg/dL (ref 8.4–10.5)
GFR calc non Af Amer: 56 mL/min — ABNORMAL LOW (ref >90)
Glucose, Bld: 166 mg/dL — ABNORMAL HIGH (ref 70–99)

## 2011-09-30 MED ORDER — FUROSEMIDE 10 MG/ML IJ SOLN
60.0000 mg | Freq: Two times a day (BID) | INTRAMUSCULAR | Status: DC
  Administered 2011-10-01: 60 mg via INTRAVENOUS
  Filled 2011-09-30 (×4): qty 6

## 2011-09-30 NOTE — Progress Notes (Signed)
   Subjective: Feels better. No chest pain, palpitations, dizziness.   Objective: Blood pressure 94/67, pulse 92, temperature 97.6 F (36.4 C), temperature source Oral, resp. rate 20, height 5\' 5"  (1.651 m), weight 146 lb 9.7 oz (66.5 kg), SpO2 100.00%.  Intake/Output Summary (Last 24 hours) at 09/30/11 0825 Last data filed at 09/30/11 0656  Gross per 24 hour  Intake 1298.04 ml  Output   4200 ml  Net -2901.96 ml    Telemetry - NSVT noted.  Exam -   General - NAD.   Lungs - Clear nonlabored.   Cardiac - RRR, PMI lateral, soft S3.   Extremities - Trace edema.   Lab Results -  Basic Metabolic Panel:  Lab 09/30/11 1610 09/29/11 0500 09/28/11 0502 09/27/11 1409  NA 135 138 134* --  K 4.2 4.1 3.7 --  CL 96 97 93* --  CO2 27 28 29  --  GLUCOSE 166* 221* 232* --  BUN 27* 25* 26* --  CREATININE 1.11* 1.06 1.10 --  CALCIUM 9.8 9.4 9.4 --  MG -- -- -- 1.6    Liver Function Tests:  Lab 09/27/11 1409  AST 24  ALT 12  ALKPHOS 134*  BILITOT 0.8  PROT 8.4*  ALBUMIN 3.6    CBC:  Lab 09/27/11 1409  WBC 6.2  HGB 13.2  HCT 38.5  MCV 83.2  PLT 196    Current Medications    . aspirin EC  81 mg Oral Daily  . furosemide  80 mg Intravenous BID  . glimepiride  2 mg Oral QAC breakfast  . heparin  5,000 Units Subcutaneous Q8H  . insulin aspart  0-15 Units Subcutaneous TID WC  . insulin aspart  0-5 Units Subcutaneous QHS  . magnesium oxide  400 mg Oral Daily  . pantoprazole  40 mg Oral Daily  . potassium chloride  40 mEq Oral BID  . sertraline  50 mg Oral Daily  . simvastatin  20 mg Oral q1800  . sodium chloride  3 mL Intravenous Q12H  . spironolactone  25 mg Oral Daily    Assessment:  1. A/C systolic CHF, LVEF 20%. Low output, on Milrinone. Diuresing and clinically improving, weight down about 5 lbs. Renal function is relatively stable, but with slight bump. Asymptomatic NSVT noted.  2. ICM with MVCAD s/p multiple PCIs. No active chest pain.   3. DM2.  HgbA1c pending.  4. Tobacco abuse.   Plan:  Will continue Milrinone, reduce Lasix to 60 mg IV BID. No change to other medications. BMET AM.  Jonelle Sidle, M.D., F.A.C.C.

## 2011-09-30 NOTE — Plan of Care (Signed)
Problem: Phase I Progression Outcomes Goal: EF % per last Echo/documented,Core Reminder form on chart Outcome: Completed/Met Date Met:  09/30/11 EF 20-25% LAST ECHO 02/22/11

## 2011-10-01 DIAGNOSIS — F17200 Nicotine dependence, unspecified, uncomplicated: Secondary | ICD-10-CM

## 2011-10-01 LAB — BASIC METABOLIC PANEL
Calcium: 9.9 mg/dL (ref 8.4–10.5)
Creatinine, Ser: 1.06 mg/dL (ref 0.50–1.10)
GFR calc non Af Amer: 59 mL/min — ABNORMAL LOW (ref >90)
Glucose, Bld: 232 mg/dL — ABNORMAL HIGH (ref 70–99)
Sodium: 134 mEq/L — ABNORMAL LOW (ref 135–145)

## 2011-10-01 LAB — GLUCOSE, CAPILLARY
Glucose-Capillary: 242 mg/dL — ABNORMAL HIGH (ref 70–99)
Glucose-Capillary: 222 mg/dL — ABNORMAL HIGH (ref 70–99)
Glucose-Capillary: 148 mg/dL — ABNORMAL HIGH (ref 70–99)

## 2011-10-01 LAB — MAGNESIUM: Magnesium: 2 mg/dL (ref 1.5–2.5)

## 2011-10-01 MED ORDER — FUROSEMIDE 10 MG/ML IJ SOLN
80.0000 mg | Freq: Three times a day (TID) | INTRAMUSCULAR | Status: DC
  Administered 2011-10-01 – 2011-10-02 (×5): 80 mg via INTRAVENOUS
  Filled 2011-10-01 (×8): qty 8

## 2011-10-01 MED ORDER — NICOTINE 14 MG/24HR TD PT24
14.0000 mg | MEDICATED_PATCH | Freq: Every day | TRANSDERMAL | Status: DC
  Administered 2011-10-01 – 2011-10-04 (×4): 14 mg via TRANSDERMAL
  Filled 2011-10-01 (×5): qty 1

## 2011-10-01 MED ORDER — METOLAZONE 2.5 MG PO TABS
2.5000 mg | ORAL_TABLET | Freq: Once | ORAL | Status: AC
  Administered 2011-10-01: 2.5 mg via ORAL
  Filled 2011-10-01: qty 1

## 2011-10-01 NOTE — Progress Notes (Signed)
Spoke with HF team including Dr. Gala Romney regarding whether or not patient could take diuretics with BP of 93/65. They say it is fine to give as long as SBP >85. Jessica [name], Melida Quitter

## 2011-10-01 NOTE — Progress Notes (Signed)
Advanced Heart Failure Rounding Note  Primary Physician: None  Primary Cardiologist: Dr. Gala Romney  Reason for Admission: Massive volume overload in setting of systolic heart failure  Baseline proBNP: 2212 on 02/2011  Weight Range: 133-134 pounds  Subjective:    Jessica [name] is a 51 y.o. female with a history of severe systolic heart failure, secondary ischemic cardiomyopathy with an ejection fraction of 20-25%, history of CAD with s/p multiple interventions, ICD implantation, diabetes, hyperlipidemia, history of ongoing tobacco and possibly alcohol use.  She has inolerance of ACE-I due to severe cough. Problem tolerating b-blocker and ARB due to low BP.   Last cardiac cath in 02/2011 showed severe 3 vessel CAD with occlusion of multiple stents, EF 10-15%. She was evaluated by Dr. Maren Beach for CABG but felt not to be a candidate. Right heart cath at the time showed RA = 18, RV = 67/10/20, PA = 56/30 (41), PCW = 23, Ao = 97/72 (83), LV = 93/20/30, Fick cardiac output/index = 2.8/1.7, PVR = 6.5 Woods with SVR = 1878, FA sat = 88%, PA sat = 42%, 46%. There was discussion with the patient concerning LVAD as an option, however patient declined. Medical therapy continued.   She was admitted on 09/27/11 for massive fluid overload (15 pounds) and was started on treatment with Milrinone and IV diuretic Lasix. She feels better now. Weight down 5 pounds. Denies SOB at rest or with ambulation. She is able to ambulate on hallway without DOE. Still c/o occasional orthopnea but denies PND. No chest pain or chest pressure.  Of note, She was noted to have asymptomatic NSVT on 09/29/11.   Objective:     Vital Signs:   Temp:  [97 F (36.1 C)-98.6 F (37 C)] 97 F (36.1 C) (07/22 0543) Pulse Rate:  [81-89] 89  (07/22 0543) Resp:  [18-20] 18  (07/22 0543) BP: (98-101)/(63-72) 100/71 mmHg (07/22 0808) SpO2:  [92 %-97 %] 96 % (07/22 0543) Weight:  [145 lb 11.2 oz (66.089 kg)] 145 lb 11.2 oz (66.089 kg) (07/22  0543) Last BM Date: 09/30/11  Weight change: Filed Weights   09/29/11 0613 09/30/11 0426 10/01/11 0543  Weight: 149 lb 0.5 oz (67.6 kg) 146 lb 9.7 oz (66.5 kg) 145 lb 11.2 oz (66.089 kg)      . aspirin EC  81 mg Oral Daily  . furosemide  60 mg Intravenous BID  . glimepiride  2 mg Oral QAC breakfast  . heparin  5,000 Units Subcutaneous Q8H  . insulin aspart  0-15 Units Subcutaneous TID WC  . insulin aspart  0-5 Units Subcutaneous QHS  . magnesium oxide  400 mg Oral Daily  . nicotine  14 mg Transdermal Daily  . pantoprazole  40 mg Oral Daily  . potassium chloride  40 mEq Oral BID  . sertraline  50 mg Oral Daily  . simvastatin  20 mg Oral q1800  . sodium chloride  3 mL Intravenous Q12H  . spironolactone  25 mg Oral Daily      . milrinone 0.25 mcg/kg/min (09/30/11 0157)    Intake/Output:   Intake/Output Summary (Last 24 hours) at 10/01/11 0848 Last data filed at 10/01/11 0825  Gross per 24 hour  Intake 1167.93 ml  Output   1950 ml  Net -782.07 ml   Negative Net 6000 ml since admission  Physical Exam: General:  NAD Neck: supple. JVP 9 cm . Carotids 2+ bilat; no bruits. Cor: RRR, PMI lateral, soft S3 Lungs: CTA B/L Abdomen: soft, distended, NT Good  bowel sounds. Extremities: trace edema. No cyanosis or clubbing Neuro: alert & orientedx3  Telemetry: NSR  Labs: Basic Metabolic Panel:  Lab 10/01/11 1610 09/30/11 0555 09/29/11 0500 09/28/11 0502 09/27/11 1409  NA 134* 135 138 134* 135  K 4.6 4.2 4.1 3.7 3.5  CL 95* 96 97 93* 96  CO2 24 27 28 29 23   GLUCOSE 232* 166* 221* 232* 158*  BUN 31* 27* 25* 26* 20  CREATININE 1.06 1.11* 1.06 1.10 1.00  CALCIUM 9.9 9.8 9.4 -- --  MG -- -- -- -- 1.6  PHOS -- -- -- -- --    Liver Function Tests:  Lab 09/27/11 1409  AST 24  ALT 12  ALKPHOS 134*  BILITOT 0.8  PROT 8.4*  ALBUMIN 3.6    CBC:  Lab 09/27/11 1409  WBC 6.2  NEUTROABS --  HGB 13.2  HCT 38.5  MCV 83.2  PLT 196    BNP (last 3  results)  Basename 09/27/11 1409 12/15/10 0107  PROBNP 2407.0* 2212.0*   Imaging:  No results found.   Medications:     Scheduled Medications:    . aspirin EC  81 mg Oral Daily  . furosemide  60 mg Intravenous BID  . glimepiride  2 mg Oral QAC breakfast  . heparin  5,000 Units Subcutaneous Q8H  . insulin aspart  0-15 Units Subcutaneous TID WC  . insulin aspart  0-5 Units Subcutaneous QHS  . magnesium oxide  400 mg Oral Daily  . pantoprazole  40 mg Oral Daily  . potassium chloride  40 mEq Oral BID  . sertraline  50 mg Oral Daily  . simvastatin  20 mg Oral q1800  . sodium chloride  3 mL Intravenous Q12H  . spironolactone  25 mg Oral Daily     Infusions:    . milrinone 0.25 mcg/kg/min (09/30/11 0157)     PRN Medications:  sodium chloride, acetaminophen, albuterol, ondansetron (ZOFRAN) IV, sodium chloride   Assessment:   1. Acute on Chronic Systolic HF  2. ICM, EF 20%  3. 3-V CAD with multiple stent occlusions  - not a CABG candidate  4. DM  5. Continued tobacco abuse  6. History of DVT, April 2012   - 6 months of coumadin use    Plan/Discussion:    1. Acute systolic CHF: Patient is ~15 lbs (primarily in the abdomen) above her baseline on admission. She has not responded to her home regimen of po Lasix and Metolazone. She was started on IV diuresis and inotropic treatment with Milrinone. She has negative net~6000 ml since admission and 4 Lbs weight loss. Renal function is stable. Clinically, she is feeling better. Still   Digoxin is on hold, and No ACE-I/ARB or beta blocker initially due to hypotension.   - continue Milrinone - lasix reduced to 60 mg IV BID due to slightly elevated creatinine>> Creatinine is back to baseline, will consider increase Lasix to 80 mg po BID. - Daily BMP and Mg  2. ICF: EF 20-25% - may consider repeat 2D echo once she is stabilized.   3. 3-V CAD with multiple stent occlusions  - no ischemic symptoms  - not a CABG  candidate   4. DM. HGB A1C 8.5 on 09/30/11 - continue SSI>>Novolog Moderate - continue Amaryl - DM educator   5. Continued tobacco abuse  - Tobacco cessation - Nicotine patch  6. History of DVT, April 2012  Status post right iliofemoral embolectomy and compartment fasciotomy of the right leg by  Dr. Darrick Penna with LLE DVT 06/2010. Coumadin was discontinued due to noncompliance but she did receive 6 months of therapy. She has a history of noncompliance.  7. VTE: Heparin SQ  8. NSVT   Length of Stay: 4   LI, NA 10/01/2011, 8:48 AM  Patient seen and examined with Dr. Dierdre Searles. We discussed all aspects of the encounter. I agree with the assessment and plan as stated above.   She is symptomatically improved but still with volume on board. Will increase lasix to 80 tid and add daily metolazone. BP still too low for b-blocker or ARB. Will try to reinitiate low-dose b-blocker prior to d/c. Continue IV milrinone.   Not candidate for advanced therapies.   Nicholas Trompeter,MD 11:03 AM

## 2011-10-01 NOTE — Progress Notes (Signed)
Inpatient Diabetes Program Recommendations  AACE/ADA: New Consensus Statement on Inpatient Glycemic Control (2009)  Target Ranges:  Prepandial:   less than 140 mg/dL      Peak postprandial:   less than 180 mg/dL (1-2 hours)      Critically ill patients:  140 - 180 mg/dL    Inpatient Diabetes Program Recommendations Correction (SSI): . Insulin - Meal Coverage: Add Novolog 3 units TID with meals  HgbA1C: =8.5  Thank you  Piedad Climes [facility] Inpatient Diabetes Coordinator [phone]

## 2011-10-02 LAB — BASIC METABOLIC PANEL
BUN: 40 mg/dL — ABNORMAL HIGH (ref 6–23)
CO2: 28 mEq/L (ref 19–32)
Chloride: 95 mEq/L — ABNORMAL LOW (ref 96–112)
Creatinine, Ser: 1.15 mg/dL — ABNORMAL HIGH (ref 0.50–1.10)
Glucose, Bld: 213 mg/dL — ABNORMAL HIGH (ref 70–99)
Potassium: 4.1 mEq/L (ref 3.5–5.1)

## 2011-10-02 LAB — GLUCOSE, CAPILLARY
Glucose-Capillary: 227 mg/dL — ABNORMAL HIGH (ref 70–99)
Glucose-Capillary: 164 mg/dL — ABNORMAL HIGH (ref 70–99)
Glucose-Capillary: 157 mg/dL — ABNORMAL HIGH (ref 70–99)

## 2011-10-02 MED ORDER — INSULIN ASPART 100 UNIT/ML ~~LOC~~ SOLN
2.0000 [IU] | Freq: Three times a day (TID) | SUBCUTANEOUS | Status: DC
  Administered 2011-10-02 – 2011-10-03 (×3): 2 [IU] via SUBCUTANEOUS

## 2011-10-02 MED ORDER — LIVING WELL WITH DIABETES BOOK
Freq: Once | Status: AC
  Administered 2011-10-02: 1
  Filled 2011-10-02: qty 1

## 2011-10-02 MED ORDER — METOLAZONE 2.5 MG PO TABS
2.5000 mg | ORAL_TABLET | Freq: Once | ORAL | Status: AC
  Administered 2011-10-02: 2.5 mg via ORAL
  Filled 2011-10-02 (×2): qty 1

## 2011-10-02 NOTE — Care Management Note (Signed)
    Page 1 of 1   10/02/2011     4:37:20 PM   CARE MANAGEMENT NOTE 10/02/2011  Patient:  Jessica [name], Jessica [name]   Account Number:  [id number]  Date Initiated:  10/02/2011  Documentation initiated by:  Donn Pierini  Subjective/Objective Assessment:   Pt admitted with  CHF     Action/Plan:   PTA pt lived at home with boyfriend, independent   Anticipated DC Date:  10/05/2011   Anticipated DC Plan:  HOME/SELF CARE      DC Planning Services  CM consult      Choice offered to / List presented to:             Status of service:  In process, will continue to follow Medicare Important Message given?   (If response is "NO", the following Medicare IM given date fields will be blank) Date Medicare IM given:   Date Additional Medicare IM given:    Discharge Disposition:    Per UR Regulation:  Reviewed for med. necessity/level of care/duration of stay  If discussed at Long Length of Stay Meetings, dates discussed:    Comments:  10/02/11- 1600- Donn Pierini RN, BSN [phone] Spoke with pt at bedside regarding potential d/c needs. Per conversation pt states that she has a scale at home and weighs herself daily, she also states she tries to watch her sodium and fluid intake. She reports she use to go to Dr. Ladene Artist no longer practices- she plans to see about going to [facility] as her primary. Pt also reports that she has had a HH RN from [facility] follow her in the past for CHF- does not feel like she needs any f/u at this time. She does report that she needs a new glucometer and strips- MD could you please give pt script for glucometer at discharge? Pt goes to [facility] for medications and has transportation home.

## 2011-10-02 NOTE — Progress Notes (Addendum)
Advanced Heart Failure Rounding Note  Primary Physician: None  Primary Cardiologist: Dr. Gala Romney  Reason for Admission: Volume overload in setting of systolic heart failure  Baseline proBNP: 2212 on 02/2011  Weight Range: 133-134 pounds    Subjective:    Jessica [name] is a 51 y.o. female with a history of severe systolic heart failure, secondary ischemic cardiomyopathy with an ejection fraction of 20-25%, history of CAD with s/p multiple interventions, ICD implantation, diabetes, hyperlipidemia, history of ongoing tobacco and possibly alcohol use. She has inolerance of ACE-I due to severe cough. Problem tolerating b-blocker and ARB due to low BP.   Last cardiac cath in 02/2011 showed severe 3 vessel CAD with occlusion of multiple stents, EF 10-15%. She was evaluated by Dr. Maren Beach for CABG but felt not to be a candidate. Right heart cath at the time showed RA = 18, RV = 67/10/20, PA = 56/30 (41), PCW = 23, Ao = 97/72 (83), LV = 93/20/30, Fick cardiac output/index = 2.8/1.7, PVR = 6.5 Woods with SVR = 1878, FA sat = 88%, PA sat = 42%, 46%. There was discussion with the patient concerning LVAD as an option, however patient declined. Medical therapy continued.   She was admitted on 09/27/11 for massive fluid overload (15 pounds) and was started on treatment with Milrinone and IV diuretic Lasix. She was noted to have asymptomatic NSVT on 09/29/11.  She feels better now. Patient states that she has had intermittent mild nonproductive cough worsening with supine position since the onset of her A/C CHF, and her cough is better with her diuresis.  Denies SOB at rest or with ambulation, orthopnea or PND. She is able to ambulate on hallway without DOE.  No chest pain or chest pressure. Continue to have good diuresis with Net negative ~ 2000 ml /24h. Weight down 6 pounds since admission.  Objective:     Vital Signs:   Temp:  [97.5 F (36.4 C)-98.1 F (36.7 C)] 97.7 F (36.5 C) (07/23 0605) Pulse Rate:   [83-94] 94  (07/23 0605) Resp:  [19] 19  (07/23 0605) BP: (92-101)/(46-67) 99/46 mmHg (07/23 0605) SpO2:  [94 %-97 %] 97 % (07/23 0605) Weight:  [143 lb 8.3 oz (65.1 kg)] 143 lb 8.3 oz (65.1 kg) (07/23 0605) Last BM Date: 09/30/11  Weight change: Filed Weights   09/30/11 0426 10/01/11 0543 10/02/11 0605  Weight: 146 lb 9.7 oz (66.5 kg) 145 lb 11.2 oz (66.089 kg) 143 lb 8.3 oz (65.1 kg)    Intake/Output:   Intake/Output Summary (Last 24 hours) at 10/02/11 0817 Last data filed at 10/02/11 0704  Gross per 24 hour  Intake 1783.35 ml  Output   4175 ml  Net -2391.65 ml     Physical Exam: General: NAD  Neck: supple. JVP 8 cm . Carotids 2+ bilat; no bruits.  Cor: RRR, PMI lateral, soft S3  Lungs: CTA B/L  Abdomen: soft, distended, NT Good bowel sounds.  Extremities: No edema. No cyanosis or clubbing  Neuro: alert & orientedx3   Telemetry: NSR  Labs: Basic Metabolic Panel:  Lab 10/02/11 1610 10/01/11 0500 09/30/11 0555 09/29/11 0500 09/28/11 0502 09/27/11 1409  NA 137 134* 135 138 134* --  K 4.1 4.6 4.2 4.1 3.7 --  CL 95* 95* 96 97 93* --  CO2 28 24 27 28 29  --  GLUCOSE 213* 232* 166* 221* 232* --  BUN 40* 31* 27* 25* 26* --  CREATININE 1.15* 1.06 1.11* 1.06 1.10 --  CALCIUM 10.4 9.9 9.8 -- -- --  MG 2.1 2.0 -- -- -- 1.6  PHOS -- -- -- -- -- --    Liver Function Tests:  Lab 09/27/11 1409  AST 24  ALT 12  ALKPHOS 134*  BILITOT 0.8  PROT 8.4*  ALBUMIN 3.6    CBC:  Lab 09/27/11 1409  WBC 6.2  NEUTROABS --  HGB 13.2  HCT 38.5  MCV 83.2  PLT 196     BNP: BNP (last 3 results)  Basename 09/27/11 1409 12/15/10 0107  PROBNP 2407.0* 2212.0*     Imaging:  No results found.   Medications:     Scheduled Medications:    . aspirin EC  81 mg Oral Daily  . furosemide  80 mg Intravenous TID  . glimepiride  2 mg Oral QAC breakfast  . heparin  5,000 Units Subcutaneous Q8H  . insulin aspart  0-15 Units Subcutaneous TID WC  . insulin aspart  0-5  Units Subcutaneous QHS  . magnesium oxide  400 mg Oral Daily  . metolazone  2.5 mg Oral Once  . nicotine  14 mg Transdermal Daily  . pantoprazole  40 mg Oral Daily  . potassium chloride  40 mEq Oral BID  . sertraline  50 mg Oral Daily  . simvastatin  20 mg Oral q1800  . sodium chloride  3 mL Intravenous Q12H  . spironolactone  25 mg Oral Daily  . DISCONTD: furosemide  60 mg Intravenous BID     Infusions:    . milrinone 0.25 mcg/kg/min (10/01/11 1547)     PRN Medications:  sodium chloride, acetaminophen, albuterol, ondansetron (ZOFRAN) IV, sodium chloride   Assessment:   1. Acute on Chronic Systolic HF  2. ICM, EF 20%  3. 3-V CAD with multiple stent occlusions  - not a CABG candidate  4. DM  5. Continued tobacco abuse  6. History of DVT, April 2012  - 6 months of coumadin use   Plan/Discussion:    1. Acute systolic CHF: ~15 lbs above her baseline on admission. Patient feels better with treatment of diuretic IV lasix and Inotropic Milrinone since admission. Her Lasix was increased to 80 mg IV TID and Metolazobne was added on 10/01/11 for more aggressive diuresis and she has had ~ 2000 ml Net Negative yesterday. Weight down 6 lb. Her Cr is 1.15 this am which is < 10 % elevation. W  - low sodium diet and FR 1500 ml - Digoxin is on hold and continue Milrinone  -BP at 90's/50s and No ARB or beta blocker due to soft BP>>will consider to add low dose BB upon discharge. - continue current lasix dosage today and monitor renal function closely. - Daily BMP and Mg   2. ICF: EF 20-25%  - may consider repeat 2D echo once she is stabilized.   3. 3-V CAD with multiple stent occlusions  - no ischemic symptoms  - not a CABG candidate   4. DM. HGB A1C 8.5 on 09/30/11  - continue SSI>>Novolog Moderate  - add Novolog 2 units TID with meals - continue Amaryl - DM educator   5. Continued tobacco abuse  - Tobacco cessation  - Nicotine patch   6. History of DVT, April 2012    Status post right iliofemoral embolectomy and compartment fasciotomy of the right leg by Dr. Darrick Penna with LLE DVT 06/2010. Coumadin was discontinued due to noncompliance but she did receive 6 months of therapy. She has a history of noncompliance.   7. VTE: Heparin SQ   8. NSVT  Asymptomatic NSVT on 09/29/11. No ICD shocks.   Length of Stay: 5 LI, NA, MD PGY-2 10/02/2011, 8:17 AM  Patient seen and examined with Dr. Dierdre Searles. We discussed all aspects of the encounter. I agree with the assessment and plan as stated above.   Doing well with increased diuresis. Still about 10 pounds above baseline. Will continue milrinone and aggressive diuresis today. Watch renal function. BP remains soft so will not tolerate further med titration at this time.  Jessica Kempner,MD 10:20 AM

## 2011-10-02 NOTE — Progress Notes (Signed)
Inpatient Diabetes Program Recommendations  AACE/ADA: New Consensus Statement on Inpatient Glycemic Control (2009)  Target Ranges:  Prepandial:   less than 140 mg/dL      Peak postprandial:   less than 180 mg/dL (1-2 hours)      Critically ill patients:  140 - 180 mg/dL   Fasting glucose remains >200   Inpatient Diabetes Program Recommendations Insulin - Basal: Add Lantus 10 units Correction (SSI): . Insulin - Meal Coverage: . HgbA1C: =8.5  Thank you  P[facility] Inpatient Diabetes Coordinator [phone]

## 2011-10-03 DIAGNOSIS — N179 Acute kidney failure, unspecified: Secondary | ICD-10-CM

## 2011-10-03 DIAGNOSIS — N189 Chronic kidney disease, unspecified: Secondary | ICD-10-CM

## 2011-10-03 LAB — BASIC METABOLIC PANEL
BUN: 47 mg/dL — ABNORMAL HIGH (ref 6–23)
Calcium: 10.6 mg/dL — ABNORMAL HIGH (ref 8.4–10.5)
Chloride: 92 mEq/L — ABNORMAL LOW (ref 96–112)
Creatinine, Ser: 1.46 mg/dL — ABNORMAL HIGH (ref 0.50–1.10)
GFR calc Af Amer: 46 mL/min — ABNORMAL LOW (ref >90)

## 2011-10-03 LAB — MAGNESIUM: Magnesium: 2.2 mg/dL (ref 1.5–2.5)

## 2011-10-03 LAB — GLUCOSE, CAPILLARY: Glucose-Capillary: 116 mg/dL — ABNORMAL HIGH (ref 70–99)

## 2011-10-03 MED ORDER — METOLAZONE 2.5 MG PO TABS
2.5000 mg | ORAL_TABLET | Freq: Every day | ORAL | Status: DC
  Administered 2011-10-03: 2.5 mg via ORAL
  Filled 2011-10-03: qty 1

## 2011-10-03 MED ORDER — FUROSEMIDE 10 MG/ML IJ SOLN
80.0000 mg | Freq: Two times a day (BID) | INTRAMUSCULAR | Status: DC
  Administered 2011-10-03: 80 mg via INTRAVENOUS
  Filled 2011-10-03 (×3): qty 8

## 2011-10-03 MED ORDER — FUROSEMIDE 40 MG PO TABS
40.0000 mg | ORAL_TABLET | Freq: Two times a day (BID) | ORAL | Status: DC
  Administered 2011-10-04: 40 mg via ORAL
  Filled 2011-10-03 (×3): qty 1

## 2011-10-03 MED ORDER — INSULIN GLARGINE 100 UNIT/ML ~~LOC~~ SOLN
10.0000 [IU] | Freq: Every day | SUBCUTANEOUS | Status: DC
  Administered 2011-10-03 – 2011-10-04 (×2): 10 [IU] via SUBCUTANEOUS

## 2011-10-03 NOTE — Progress Notes (Signed)
Advanced Heart Failure Rounding Note Primary Physician: None  Primary Cardiologist: Dr. Gala Romney  Reason for Admission: Volume overload in setting of systolic heart failure  Baseline proBNP: 2212 on 02/2011  Weight Range: 135-140 pounds    Subjective:    Jessica [name] is a 51 y.o. female with a history of severe systolic heart failure, secondary ischemic cardiomyopathy with an ejection fraction of 20-25%, history of CAD with s/p multiple interventions, ICD implantation, diabetes, hyperlipidemia, history of ongoing tobacco and possibly alcohol use. She has inolerance of ACE-I due to severe cough. Problem tolerating b-blocker and ARB due to low BP.   Last cardiac cath in 02/2011 showed severe 3 vessel CAD with occlusion of multiple stents, EF 10-15%. She was evaluated by Dr. Maren Beach for CABG but felt not to be a candidate. Right heart cath at the time showed RA = 18, RV = 67/10/20, PA = 56/30 (41), PCW = 23, Ao = 97/72 (83), LV = 93/20/30, Fick cardiac output/index = 2.8/1.7, PVR = 6.5 Woods with SVR = 1878, FA sat = 88%, PA sat = 42%, 46%. There was discussion with the patient concerning LVAD as an option, however patient declined. Medical therapy continued.   She was admitted on 09/27/11 for massive fluid overload (15 pounds) and was started on treatment with Milrinone and IV diuretic Lasix. She was noted to have asymptomatic NSVT on 09/29/11. She feels better now. Lee nonproductive cough on supine position. Denies SOB at rest or with ambulation orthopnea or PND. She is able to ambulate on hallway without DOE. No chest pain or chest pressure. Continue to have good diuresis with Net negative ~ 2000 ml /24h. She drops averagely one pounds daily. Weight down 7 pounds since admission.     Objective:   Weight Range:  Vital Signs:   Temp:  [97 F (36.1 C)-97.9 F (36.6 C)] 97.8 F (36.6 C) (07/24 0620) Pulse Rate:  [89-101] 101  (07/24 0620) Resp:  [18-98] 98  (07/24 0620) BP: (91-116)/(62-70) 91/62  mmHg (07/24 0620) SpO2:  [95 %-97 %] 97 % (07/24 0620) Weight:  [142 lb 10.2 oz (64.7 kg)] 142 lb 10.2 oz (64.7 kg) (07/24 0620) Last BM Date: 10/01/11  Weight change: Filed Weights   10/01/11 0543 10/02/11 0605 10/03/11 0620  Weight: 145 lb 11.2 oz (66.089 kg) 143 lb 8.3 oz (65.1 kg) 142 lb 10.2 oz (64.7 kg)      . aspirin EC  81 mg Oral Daily  . heparin  5,000 Units Subcutaneous Q8H  . insulin aspart  0-15 Units Subcutaneous TID WC  . insulin aspart  0-5 Units Subcutaneous QHS  . insulin glargine  10 Units Subcutaneous Daily  . living well with diabetes book   Does not apply Once  . magnesium oxide  400 mg Oral Daily  . metolazone  2.5 mg Oral Once  . nicotine  14 mg Transdermal Daily  . pantoprazole  40 mg Oral Daily  . sertraline  50 mg Oral Daily  . simvastatin  20 mg Oral q1800  . sodium chloride  3 mL Intravenous Q12H  . spironolactone  25 mg Oral Daily  . DISCONTD: furosemide  80 mg Intravenous TID  . DISCONTD: furosemide  80 mg Intravenous BID  . DISCONTD: glimepiride  2 mg Oral QAC breakfast  . DISCONTD: insulin aspart  2 Units Subcutaneous TID WC  . DISCONTD: metolazone  2.5 mg Oral Daily  . DISCONTD: potassium chloride  40 mEq Oral BID    Intake/Output:  Intake/Output Summary (Last 24 hours) at 10/03/11 0829 Last data filed at 10/03/11 0622  Gross per 24 hour  Intake   1170 ml  Output   3100 ml  Net  -1930 ml     Physical Exam: General: NAD  Neck: supple. JVP 8 cm . Carotids 2+ bilat; no bruits.  Cor: RRR, PMI lateral, soft S3  Lungs: CTA B/L  Abdomen: soft, distended, NT Good bowel sounds.  Extremities: No edema. No cyanosis or clubbing  Neuro: alert & orientedx3  Telemetry: NSR  Labs: Basic Metabolic Panel:  Lab 10/03/11 4098 10/02/11 0540 10/01/11 0500 09/30/11 0555 09/29/11 0500 09/27/11 1409  NA 136 137 134* 135 138 --  K 4.5 4.1 4.6 4.2 4.1 --  CL 92* 95* 95* 96 97 --  CO2 29 28 24 27 28  --  GLUCOSE 169* 213* 232* 166* 221* --  BUN  47* 40* 31* 27* 25* --  CREATININE 1.46* 1.15* 1.06 1.11* 1.06 --  CALCIUM 10.6* 10.4 9.9 -- -- --  MG 2.2 2.1 2.0 -- -- 1.6  PHOS -- -- -- -- -- --    Liver Function Tests:  Lab 09/27/11 1409  AST 24  ALT 12  ALKPHOS 134*  BILITOT 0.8  PROT 8.4*  ALBUMIN 3.6    CBC:  Lab 09/27/11 1409  WBC 6.2  NEUTROABS --  HGB 13.2  HCT 38.5  MCV 83.2  PLT 196    BNP: BNP (last 3 results)  Basename 09/27/11 1409 12/15/10 0107  PROBNP 2407.0* 2212.0*      Imaging:  No results found.   Medications:     Scheduled Medications:    . aspirin EC  81 mg Oral Daily  . furosemide  80 mg Intravenous TID  . glimepiride  2 mg Oral QAC breakfast  . heparin  5,000 Units Subcutaneous Q8H  . insulin aspart  0-15 Units Subcutaneous TID WC  . insulin aspart  0-5 Units Subcutaneous QHS  . insulin aspart  2 Units Subcutaneous TID WC  . living well with diabetes book   Does not apply Once  . magnesium oxide  400 mg Oral Daily  . metolazone  2.5 mg Oral Once  . nicotine  14 mg Transdermal Daily  . pantoprazole  40 mg Oral Daily  . potassium chloride  40 mEq Oral BID  . sertraline  50 mg Oral Daily  . simvastatin  20 mg Oral q1800  . sodium chloride  3 mL Intravenous Q12H  . spironolactone  25 mg Oral Daily     Infusions:    . milrinone 0.25 mcg/kg/min (10/01/11 1547)     PRN Medications:  sodium chloride, acetaminophen, albuterol, ondansetron (ZOFRAN) IV, sodium chloride   Assessment:  1. Acute on Chronic Systolic HF  2. A/C renal failure 3. ICM, EF 20%  4. 3-V CAD with multiple stent occlusions  - not a CABG candidate  5. DM  6. Continued tobacco abuse  7. History of DVT, April 2012  - 6 months of coumadin use    Plan/Discussion:    1. Acute systolic CHF: ~15 lbs above her baseline on admission.   feels better with inotropic and diuretic therapy. Continue to have good diuresis with a total net Negative ~ 10,000 ml thus far. Weight down 7 lbs. Still 6-7 lbs  over. Cr elevated to 1.46 today, which is over 30% elevation.    - low sodium diet and FR 1500 ml  - Digoxin is on hold and continue  Milrinone - decrease Lasix to 80 mg IV BID and continue metolazone 2.5 mg po daily  - BP at 90's-100's/50s with HR 80's-100's<<may consider low dose BB today.  - Daily BMP and Mg   2. A/C renal failure  see above discussion. Likely lasix induced, will decrease lasix dosage  3. ICF: EF 20-25%  - may consider repeat 2D echo once she is stabilized.   4. 3-V CAD with multiple stent occlusions  - no ischemic symptoms  - not a CABG candidate   5. DM. HGB A1C 8.5 on 09/30/11  - DM educator >>CBG suboptimal.  - will hold her Amaryl  - will start Lantus 10 units daily am.  - monitor CBG closely.  6. Continued tobacco abuse  - Tobacco cessation  - Nicotine patch   7. History of DVT, April 2012  Status post right iliofemoral embolectomy and compartment fasciotomy of the right leg by Dr. Darrick Penna with LLE DVT 06/2010. Coumadin was discontinued due to noncompliance but she did receive 6 months of therapy. She has a history of noncompliance.   8. VTE: Heparin SQ   9. NSVT   Asymptomatic NSVT on 09/29/11. No ICD shocks   Length of Stay: 6   LI, NA 10/03/2011, 8:29 AM  Patient seen and examined with Dr. Dierdre Searles. We discussed all aspects of the encounter. I agree with the assessment and plan as stated above.   Doing well. Nearing euvolemia. Creatinine starting to bump. Will stop lasix and metolazone. Resume po lasix tomorrow. Home tomorrow if renal function stable. BP too soft for b-blocker or ace-i at this time.  Inesha Sow,MD 11:22 AM

## 2011-10-03 NOTE — Progress Notes (Addendum)
Pts HR went up to 130.  Pt asymptomatic BP 97/67 and HR is now 85.  Theodore Demark PA notified.  No new orders given, will continue to monitor.

## 2011-10-04 LAB — GLUCOSE, CAPILLARY: Glucose-Capillary: 340 mg/dL — ABNORMAL HIGH (ref 70–99)

## 2011-10-04 LAB — BASIC METABOLIC PANEL
BUN: 47 mg/dL — ABNORMAL HIGH (ref 6–23)
Chloride: 92 mEq/L — ABNORMAL LOW (ref 96–112)
GFR calc Af Amer: 51 mL/min — ABNORMAL LOW (ref >90)
Potassium: 4.3 mEq/L (ref 3.5–5.1)
Sodium: 133 mEq/L — ABNORMAL LOW (ref 135–145)

## 2011-10-04 LAB — MAGNESIUM: Magnesium: 2.3 mg/dL (ref 1.5–2.5)

## 2011-10-04 MED ORDER — BD GETTING STARTED TAKE HOME KIT: 3/10ML X 30G SYRINGES: 1.0000 | Freq: Once | Status: DC

## 2011-10-04 MED ORDER — NICOTINE 14 MG/24HR TD PT24: 1.0000 | MEDICATED_PATCH | Freq: Every day | TRANSDERMAL | Status: AC

## 2011-10-04 MED ORDER — METOPROLOL SUCCINATE ER 25 MG PO TB24: 25.0000 mg | ORAL_TABLET | Freq: Every day | ORAL | Status: DC

## 2011-10-04 MED ORDER — "INSULIN SYRINGE 31G X 5/16"" 0.3 ML MISC": 1.0000 "application " | Freq: Four times a day (QID) | Status: DC

## 2011-10-04 MED ORDER — METOLAZONE 2.5 MG PO TABS: 2.5000 mg | ORAL_TABLET | Freq: Every day | ORAL | Status: DC | PRN

## 2011-10-04 MED ORDER — BD GETTING STARTED TAKE HOME KIT: 3/10ML X 30G SYRINGES
1.0000 | Freq: Once | Status: DC
  Filled 2011-10-04: qty 1

## 2011-10-04 MED ORDER — METOLAZONE 2.5 MG PO TABS: 2.5000 mg | ORAL_TABLET | Freq: Every day | ORAL | Status: DC

## 2011-10-04 MED ORDER — FUROSEMIDE 40 MG PO TABS: 40.0000 mg | ORAL_TABLET | Freq: Two times a day (BID) | ORAL | Status: DC

## 2011-10-04 MED ORDER — INSULIN GLARGINE 100 UNIT/ML ~~LOC~~ SOLN: 10.0000 [IU] | Freq: Every day | SUBCUTANEOUS | Status: DC

## 2011-10-04 NOTE — Progress Notes (Signed)
Educated pt on how to give herself insulin injections.  Pt demonstrated giving herself 8U of novolog for lunchtime.  Pt verbalized understanding.  Pt was given starter kit and dc instructions.  Pt verbalized understanding of dc instructions.  Pt DC home via [facility]

## 2011-10-04 NOTE — Progress Notes (Signed)
I cosign for Jessica [name]'s assessment, med administration, notes, I/O, and care plan education. 

## 2011-10-04 NOTE — Discharge Summary (Signed)
Advanced Heart Failure Team  Discharge Summary   Patient ID: Jessica [name] MRN: [id number], DOB/AGE: [birth year] 51 y.o. Admit date: 09/27/2011 D/C date:     10/04/2011   Primary Discharge Diagnoses:  1. Acute on chronic systolic heart failure  Secondary Discharge Diagnoses:  2. Acute on chronic renal failure 3. Ischemic cardiomyopathy, EF 20 % 4. 3-V CAD with multiple stents occlusions 5. Diabetes Mellitus, type II 6. Continued Tobacco abuse 7. History of DVT, April 2012. 8  Nonsustained VT on 09/29/11. No ICD shocks  Hospital Course:   Jessica [name] is a 51 y.o. female with a history of severe systolic heart failure, secondary ischemic cardiomyopathy with an ejection fraction of 20-25%, history of CAD with s/p multiple interventions, ICD implantation, diabetes, hyperlipidemia, history of ongoing tobacco and possibly alcohol use. She has inolerance of ACE-I due to severe cough. Problem tolerating b-blocker and ARB due to low BP.   Last cardiac cath in 02/2011 showed severe 3 vessel CAD with occlusion of multiple stents, EF 10-15%. She was evaluated by Dr. Maren Beach for CABG but felt not to be a candidate. Right heart cath at the time showed RA = 18, RV = 67/10/20, PA = 56/30 (41), PCW = 23, Ao = 97/72 (83), LV = 93/20/30, Fick cardiac output/index = 2.8/1.7, PVR = 6.5 Woods with SVR = 1878, FA sat = 88%, PA sat = 42%, 46%. There was discussion with the patient concerning LVAD as an option, however patient declined. Medical therapy continued.   She was admitted on 09/27/11 for fluid overload ( mainly on her abdomen) with weight of 149 lbs and was started on treatment with Milrinone and IV diuretic Lasix. She has a total of Net Negative ~ 11000 ml during this admission with 6 lbs weight loss. Her discharge weight is 143 lbs.  Clinically, she is significantly improved and feels much better upon discharge. She denies SOB, DOE, Orthopnea and PND. She is able to tolerate low sodium diet and FR 1500 ml and  ambulate at hallway without problems. Euvolemia on exam. She is stable and will be discharged home today with a follow up HF clinic appointment at 1100 am on Tuesday 10/09/11,  and she will establish primary care with Dr. Dierdre Searles at [facility] IM outpatient clinic for her DM management. She is given a starting kit for insulin therapy and will start on Lantus 10 units daily upon discharge.    Her HF medications upon discharge include Digoxin, lasix, Metolazone PRN, Spirolactone and Toprol. Previously unable to tolerate ARB or hydralazine due to hypotension. Will attempt to re-initiate as outpatient  Of note, she has had acute on chronic renal failure likely associated with IV lasix with Cr elevated to 1.46 ( baseline 1.06-1.15). Her Cr trended down to 1.34 after her IV lasix was on hold upon discharge. She will resume her home dose of lasix 40 mg po BID with a close follow up on Tuesday, 10/09/11 for repeat BMP check.    Discharge Weight Range: 143 Lbs ( baseline weight range 135-140 bs) Discharge Vitals: Blood pressure 104/72, pulse 88, temperature 98.6 F (37 C), temperature source Oral, resp. rate 17, height 5\' 5"  (1.651 m), weight 143 lb 14.4 oz (65.273 kg), SpO2 95.00%.  Labs: Lab Results  Component Value Date   WBC 6.2 09/27/2011   HGB 13.2 09/27/2011   HCT 38.5 09/27/2011   MCV 83.2 09/27/2011   PLT 196 09/27/2011     Lab 10/04/11 0504 09/27/11 1409  NA 133* --  K  4.3 --  CL 92* --  CO2 26 --  BUN 47* --  CREATININE 1.34* --  CALCIUM 10.2 --  PROT -- 8.4*  BILITOT -- 0.8  ALKPHOS -- 134*  ALT -- 12  AST -- 24  GLUCOSE 115* --   Lab Results  Component Value Date   CHOL 108 02/21/2011   HDL 20* 02/21/2011   LDLCALC 76 02/21/2011   TRIG 62 02/21/2011   BNP (last 3 results)  Basename 09/27/11 1409 12/15/10 0107  PROBNP 2407.0* 2212.0*    Diagnostic Studies/Procedures   No results found.  Discharge Medications   Medication List  As of 10/04/2011 11:12 AM   STOP taking these  medications         glimepiride 2 MG tablet         TAKE these medications         albuterol 108 (90 BASE) MCG/ACT inhaler   Commonly known as: PROVENTIL HFA;VENTOLIN HFA   Inhale 2 puffs into the lungs every 6 (six) hours as needed. For shortness of breath      albuterol (2.5 MG/3ML) 0.083% nebulizer solution   Commonly known as: PROVENTIL   Take 2.5 mg by nebulization every 4 (four) hours as needed. For shortness of breath      aspirin 81 MG EC tablet   Take 81 mg by mouth daily.      bd getting started take home kit Misc   1 kit by Other route once.      digoxin 0.125 MG tablet   Commonly known as: LANOXIN   Take 125 mcg by mouth daily.      furosemide 40 MG tablet   Commonly known as: LASIX   Take 1 tablet (40 mg total) by mouth 2 (two) times daily.      insulin glargine 100 UNIT/ML injection   Commonly known as: LANTUS   Inject 10 Units into the skin daily.      INSULIN SYRINGE .3CC/31GX5/16" 31G X 5/16" 0.3 ML Misc   1 application by Does not apply route 4 (four) times daily.      lovastatin 40 MG tablet   Commonly known as: MEVACOR   Take 40 mg by mouth at bedtime.      magnesium oxide 400 MG tablet   Commonly known as: MAG-OX   Take 400 mg by mouth daily.      metolazone 2.5 MG tablet   Commonly known as: ZAROXOLYN   Take 1 tablet (2.5 mg total) by mouth daily as needed (take it daily as needed if your wweight is 145 lbs or greater).      metoprolol succinate 25 MG 24 hr tablet   Commonly known as: TOPROL-XL   Take 1 tablet (25 mg total) by mouth at bedtime.      nicotine 14 mg/24hr patch   Commonly known as: NICODERM CQ - dosed in mg/24 hours   Place 1 patch onto the skin daily.      nitroGLYCERIN 0.4 MG SL tablet   Commonly known as: NITROSTAT   Place 0.4 mg under the tongue every 5 (five) minutes as needed. For chest pain      pantoprazole 40 MG tablet   Commonly known as: PROTONIX   Take 40 mg by mouth daily.      potassium chloride SA 20  MEQ tablet   Commonly known as: K-DUR,KLOR-CON   Take 20 mEq by mouth daily.      sertraline 50 MG tablet  Commonly known as: ZOLOFT   Take 50 mg by mouth daily. Take 1/2 tablet for the first 5-7 days then increase to 1 tablet      spironolactone 25 MG tablet   Commonly known as: ALDACTONE   Take 25 mg by mouth daily.            Disposition   The patient will be discharged in stable condition to home. Discharge Orders    Future Appointments: Provider: Department: Dept Phone: Center:   10/18/2011 1:30 PM Cecil Cranker Plyler, RD [facility] [phone] [facility]   10/18/2011 2:45 PM Ian Bushman Dierdre Searles, MD [facility] [phone] [facility]   12/31/2011 10:05 AM [facility] [phone] [id number]     Follow-up Information    Follow up with Dierdre Searles, NA, MD on 10/18/2011. (at 245pm.)    Contact information:   [address] [place] [address] [phone]       Follow up with Plyler, Lupita Leash, RD on 10/18/2011. (at 130pm)    Contact information:   [address] [place] [address] [phone]       Follow up with Arvilla Meres, MD. (please call the office to make a follow up appointment earlier next week. )    Contact information:   [address] [place] [address] [phone]            Duration of Discharge Encounter: Greater than 35 minutes   Signed, LI, NA  10/04/2011, 11:12 AM  Patient seen and examined with Dr. Dierdre Searles. I agree with above. She is ready for d/c. We have reviewed her meds with her in detail. She will have close f/u in the HF clinic.  Appolonia Ackert,MD 8:20 AM

## 2011-10-04 NOTE — Progress Notes (Signed)
Advanced Heart Failure Rounding Note  Primary Physician: None  Primary Cardiologist: Dr. Gala Romney  Reason for Admission: Volume overload in setting of systolic heart failure  Baseline proBNP: 2212 on 02/2011  Weight Range: 135-140 pounds   Subjective:    Jessica [name] is a 51 y.o. female with a history of severe systolic heart failure, secondary ischemic cardiomyopathy with an ejection fraction of 20-25%, history of CAD with s/p multiple interventions, ICD implantation, diabetes, hyperlipidemia, history of ongoing tobacco and possibly alcohol use. She has inolerance of ACE-I due to severe cough. Problem tolerating b-blocker and ARB due to low BP.   Last cardiac cath in 02/2011 showed severe 3 vessel CAD with occlusion of multiple stents, EF 10-15%. She was evaluated by Dr. Maren Beach for CABG but felt not to be a candidate. Right heart cath at the time showed RA = 18, RV = 67/10/20, PA = 56/30 (41), PCW = 23, Ao = 97/72 (83), LV = 93/20/30, Fick cardiac output/index = 2.8/1.7, PVR = 6.5 Woods with SVR = 1878, FA sat = 88%, PA sat = 42%, 46%. There was discussion with the patient concerning LVAD as an option, however patient declined. Medical therapy continued.   She was admitted on 09/27/11 for fluid overload (15 pounds) and was started on treatment with Milrinone and IV diuretic Lasix. She was noted to have asymptomatic NSVT on 09/29/11.   Continue to feel better. No c/o. Denies SOB at rest or with ambulation orthopnea or PND. No chest pain or chest pressure. Her lasix was on hold yesterday due to elevated Cr level, and the plan is to resume her home dosage of Lasix 40 mg po BID today. Her weight is up one pounds overnight since Lasix was on hold. However, she is clinically stable.     Objective:   Weight Range:  Vital Signs:   Temp:  [97 F (36.1 C)-98.6 F (37 C)] 98.6 F (37 C) (07/25 0526) Pulse Rate:  [84-89] 88  (07/25 0526) Resp:  [17-18] 17  (07/25 0526) BP: (93-104)/(50-72) 104/72 mmHg  (07/25 0526) SpO2:  [95 %-99 %] 95 % (07/25 0526) Weight:  [143 lb 14.4 oz (65.273 kg)] 143 lb 14.4 oz (65.273 kg) (07/25 0526) Last BM Date: 10/02/11  Weight change: Filed Weights   10/02/11 0605 10/03/11 0620 10/04/11 0526  Weight: 143 lb 8.3 oz (65.1 kg) 142 lb 10.2 oz (64.7 kg) 143 lb 14.4 oz (65.273 kg)    Intake/Output:   Intake/Output Summary (Last 24 hours) at 10/04/11 0722 Last data filed at 10/04/11 0500  Gross per 24 hour  Intake 1188.6 ml  Output   2700 ml  Net -1511.4 ml     Physical Exam: General: NAD  Neck: supple. JVP 8 cm . Carotids 2+ bilat; no bruits.  Cor: RRR, PMI lateral, soft S3  Lungs: CTA B/L  Abdomen: soft, distended, NT Good bowel sounds.  Extremities: No edema. No cyanosis or clubbing  Neuro: alert & orientedx3  Telemetry: NSR Labs: Basic Metabolic Panel:  Lab 10/04/11 1610 10/03/11 0635 10/02/11 0540 10/01/11 0500 09/30/11 0555 09/27/11 1409  NA 133* 136 137 134* 135 --  K 4.3 4.5 4.1 4.6 4.2 --  CL 92* 92* 95* 95* 96 --  CO2 26 29 28 24 27  --  GLUCOSE 115* 169* 213* 232* 166* --  BUN 47* 47* 40* 31* 27* --  CREATININE 1.34* 1.46* 1.15* 1.06 1.11* --  CALCIUM 10.2 10.6* 10.4 -- -- --  MG 2.3 2.2 2.1 2.0 -- 1.6  PHOS -- -- -- -- -- --    Liver Function Tests:  Lab 09/27/11 1409  AST 24  ALT 12  ALKPHOS 134*  BILITOT 0.8  PROT 8.4*  ALBUMIN 3.6    CBC:  Lab 09/27/11 1409  WBC 6.2  NEUTROABS --  HGB 13.2  HCT 38.5  MCV 83.2  PLT 196     BNP: BNP (last 3 results)  Basename 09/27/11 1409 12/15/10 0107  PROBNP 2407.0* 2212.0*       Imaging:  No results found.   Medications:     Scheduled Medications:    . aspirin EC  81 mg Oral Daily  . furosemide  40 mg Oral BID  . heparin  5,000 Units Subcutaneous Q8H  . insulin aspart  0-15 Units Subcutaneous TID WC  . insulin aspart  0-5 Units Subcutaneous QHS  . insulin glargine  10 Units Subcutaneous Daily  . magnesium oxide  400 mg Oral Daily  . nicotine  14  mg Transdermal Daily  . pantoprazole  40 mg Oral Daily  . sertraline  50 mg Oral Daily  . simvastatin  20 mg Oral q1800  . sodium chloride  3 mL Intravenous Q12H  . spironolactone  25 mg Oral Daily  . DISCONTD: furosemide  80 mg Intravenous TID  . DISCONTD: furosemide  80 mg Intravenous BID  . DISCONTD: glimepiride  2 mg Oral QAC breakfast  . DISCONTD: insulin aspart  2 Units Subcutaneous TID WC  . DISCONTD: metolazone  2.5 mg Oral Daily  . DISCONTD: potassium chloride  40 mEq Oral BID     Infusions:    . milrinone 0.25 mcg/kg/min (10/04/11 0204)     PRN Medications:  sodium chloride, acetaminophen, albuterol, ondansetron (ZOFRAN) IV, sodium chloride   Assessment:   1. Acute on Chronic Systolic HF  2. A/C renal failure  3. ICM, EF 20%  4. 3-V CAD with multiple stent occlusions  - not a CABG candidate  5. DM  6. Continued tobacco abuse  7. History of DVT, April 2012  - 6 months of coumadin use   Plan/Discussion:    1. Acute systolic CHF:  Baseline weigh range at 135-140lbs feels better with inotropic and diuretic therapy. Close to her base weight.  Lasix was on hold on 10/03/11 due to AKI and will resume at home dose 40 mg po BID today. Cr trending down.  Lab 10/04/11 0504 10/03/11 0635 10/02/11 0540 10/01/11 0500 09/30/11 0555  CREATININE 1.34* 1.46* 1.15* 1.06 1.11*     - low sodium diet and FR 1500 ml  - Digoxin is on hold and continue Milrinone  -Lasix to 40 mg po BID and continue metolazone 2.5 mg po daily  - BP at 90's-100's/50s with HR 80's-100's<<may consider low dose BB upon discharge.  - Daily BMP and Mg   2. A/C renal failure  see above discussion. Likely lasix induced, Cr trending down  3. ICF: EF 20-25%  - may consider repeat 2D echo once she is stabilized.   4. 3-V CAD with multiple stent occlusions  - no ischemic symptoms  - not a CABG candidate   5. DM. HGB A1C 8.5 on 09/30/11  - DM educator >>CBG suboptimal.  - will hold her Amaryl    - Lantus 10 units daily am.  - monitor CBG closely.   6. Continued tobacco abuse  - Tobacco cessation  - Nicotine patch   7. History of DVT, April 2012  Status post right iliofemoral embolectomy  and compartment fasciotomy of the right leg by Dr. Darrick Penna with LLE DVT 06/2010. Coumadin was discontinued due to noncompliance but she did receive 6 months of therapy. She has a history of noncompliance.   8. VTE: Heparin SQ   9. NSVT  Asymptomatic NSVT on 09/29/11. No ICD shocks   10.  disposition  -Will likely discharge her home today or tomorrow depending on her renal function. - will follow up with HF clinic in 1-2 weeks - she will also need to find a PCP for the management of her other chronic problems  - D/C HF medications      Digoxin 0.125 mg po daily    Lasix 40 mg po BID    Metolazone 2.5 mg po daily prn for weight 145 or greater    Spirolactone 25 mg po daily    Toprol XL 25 qhs    KCL 20 po daily     Magnesium Oxide 400 mg po daily    Zocor 20 daily    Lantus 10  Length of Stay: 7   LI, NA 10/04/2011, 7:22 AM  Patient seen and examined with Dr. Dierdre Searles. We discussed all aspects of the encounter. I agree with the assessment and plan as stated above.   Doing much better. Will d/c home today on above medications. She will need f/u next week in HF clinic. Will f/u with Dr. Dierdre Searles in IM clinic for further management of DM2. Glucometer provided and Insulin initiated. Previously unable to tolerate ARB or hydralazine due to hypotension. Will attempt to re-initiate as outpatient.  Total time on discharge >40 minutes.   Makhayla Mcmurry,MD 8:49 AM

## 2011-10-04 NOTE — Progress Notes (Signed)
Inpatient Diabetes Program Recommendations  AACE/ADA: New Consensus Statement on Inpatient Glycemic Control (2009)  Target Ranges:  Prepandial:   less than 140 mg/dL      Peak postprandial:   less than 180 mg/dL (1-2 hours)      Critically ill patients:  140 - 180 mg/dL   Diabetes Coordinator spoke with patient concerning NEW Rx for Lantus.  Patient has not given her own insulin yet.  RN notified to teach patient to give insulin at next scheduled dose (noon).  Starter-kit ordered and patient currently viewing DM videos 506 and 508 about insulin injections.  Meter Rx given to patient.  Discussed s/s of hypoglycemia and treatment.  Patient did receive the 'Living Well with Diabetes' patient education workbook.    Thank you  Piedad Climes [facility] Inpatient Diabetes Coordinator [phone]

## 2011-10-08 ENCOUNTER — Telehealth: Payer: Self-pay | Admitting: Dietician

## 2011-10-08 NOTE — Telephone Encounter (Signed)
Thank you very much!! I appreciate your follow-up on her DM management.  Jessica [name]

## 2011-10-08 NOTE — Telephone Encounter (Signed)
Called patient to follow up from hospitalization. She hasn't gotten meter or insulin yet- pans to get them today. Feels a bit dizzy not associated with eating. Had heart doctor tomorrow. Patient declined earlier  Appointment. CDE to call patient in 2-3 days to reassess once she begins insulin and checking blood sugars.

## 2011-10-09 ENCOUNTER — Ambulatory Visit (HOSPITAL_COMMUNITY)
Admission: RE | Admit: 2011-10-09 | Discharge: 2011-10-09 | Disposition: A | Discharge status: Home / Self Care | Payer: Medicare Other | Source: Ambulatory Visit | Attending: Internal Medicine | Admitting: Internal Medicine

## 2011-10-09 VITALS — BP 95/58 | HR 76 | Wt 145.4 lb

## 2011-10-09 DIAGNOSIS — I2510 Atherosclerotic heart disease of native coronary artery without angina pectoris: Secondary | ICD-10-CM | POA: Insufficient documentation

## 2011-10-09 DIAGNOSIS — I5022 Chronic systolic (congestive) heart failure: Secondary | ICD-10-CM | POA: Insufficient documentation

## 2011-10-09 LAB — BASIC METABOLIC PANEL
BUN: 47 mg/dL — ABNORMAL HIGH (ref 6–23)
CO2: 33 mEq/L — ABNORMAL HIGH (ref 19–32)
Chloride: 88 mEq/L — ABNORMAL LOW (ref 96–112)
Creatinine, Ser: 1.24 mg/dL — ABNORMAL HIGH (ref 0.50–1.10)
Glucose, Bld: 216 mg/dL — ABNORMAL HIGH (ref 70–99)
Potassium: 3.6 mEq/L (ref 3.5–5.1)

## 2011-10-09 NOTE — Assessment & Plan Note (Signed)
Volume status improved today.  NYHA II-III.  Have discussed sliding scale lasix and metolazone if weight greater than 145 pounds. Will check labs today.  No further med titration due to soft BP.  Have re-educated on daily weights, 1 time each morning and base diuretics on this weight.  She voiced understanding.  She will call if weight does not remain stable.  Labs today.

## 2011-10-09 NOTE — Patient Instructions (Addendum)
Take metolazone 2.5 mg (0.5 tab) if weight is 145 pounds or greater.  Take extra lasix 40 mg (1 tab) if weight is 142-145 pounds.  Labs today.  Follow up in 3 weeks  Do the following things EVERYDAY: 1) Weigh yourself in the morning before breakfast. Write it down and keep it in a log. 2) Take your medicines as prescribed 3) Eat low salt foods-Limit salt (sodium) to 2000 mg per day.  4) Stay as active as you can everyday 5) Limit all fluids for the day to less than 2 liters

## 2011-10-09 NOTE — Assessment & Plan Note (Signed)
No evidence of ischemia. Continue current regimen.   

## 2011-10-09 NOTE — Progress Notes (Addendum)
HPI:  Jessica [name] is a 51 y.o. female with a history of severe congestive heart failure, secondary ischemic cardiomyopathy with an ejection fraction of 25-30%. She also has a history of coronary artery disease, status post multiple interventions, ICD implantation, diabetes, hyperlipidemia, history of ongoing tobacco and possibly alcohol use.  Status post right iliofemoral embolectomy and compartment fasciotomy of the right leg by Dr. Darrick Penna with LLE DVT 06/2010.    She has a history of noncompliance.  She has inolerance of ACE-I due to severe cough.  Problem tolerating b-blocker and ARB due to low BP.    Cath 02/2011: showed severe 3 vessel CAD with occlusion of multiple stents, EF 10-15%  02/2011 R heart Cath: RA = 18, RV = 67/10/20, PA = 56/30 (41), PCW = 23, Ao = 97/72 (83), LV = 93/20/30, Fick cardiac output/index = 2.8/1.7, PVR = 6.5 Woods  SVR = 1878, FA sat = 88%, PA sat = 42%, 46%,   Echo 02/22/11: Only basal function preserved. EF 20-25%.   Not a candidate for CABG. Discussion with patient about LVAD as an option, however patient declined.  Medical therapy continued.  Coumadin stopped for DVT due to noncompliance with 6 months of completed therapy.     She returns for post hospital follow up.  She feels much better.  Her cough and abdominal distention has improved.  She has chronic 3 pillow orthopnea.  No PND.  Has been walking to the park, almost a mile with her dog.  She has taken metolazone once since discharge.  Her weight was 141 pounds this morning.  She is weighing multiple times per day.  She denies shocks.  No chest pain.   ROS: All systems negative except as listed in HPI, PMH and Problem List.  Past Medical History  Diagnosis Date  . Chronic systolic heart failure     a.  secondary to severe ischemic cardiomyopathy with EF 20-25%;  b. s/p rimplantation of Biotronik single chamber ICD by Dr Ladona Ridgel 02/2008;  c.  Right heart cath 04/2007: RA 18, PA 56/30 (40), PCWP 36. FICK 3.89/2.06  Woods;  d. echo 9/09: Ef 20-25%, mild MR, mild LAE  . CAD (coronary artery disease)     a. s/p multiple PCIs; b. cath 10/07: LAD stent 40%, dx 80% ISR - tx with POBA; OM stent ok; ostial OM 60%; RCA occluded with L-R collats  . Tobacco abuse   . Alcohol abuse   . History of noncompliance with medical treatment   . Diabetes mellitus, type 2   . Dizziness   . HLD (hyperlipidemia)   . DVT (deep venous thrombosis)     coumadin  . Arterial embolism     s/p right iliofemoral embolectomy with fasciotomy  . V-tach   . AICD (automatic cardioverter/defibrillator) present   . Angina   . Asthma   . Shortness of breath   . Sleep apnea   . CHF (congestive heart failure)   . GERD (gastroesophageal reflux disease)   . Anxiety   . Myocardial infarction   . COPD (chronic obstructive pulmonary disease)     Current Outpatient Prescriptions  Medication Sig Dispense Refill  . albuterol (PROVENTIL HFA;VENTOLIN HFA) 108 (90 BASE) MCG/ACT inhaler Inhale 2 puffs into the lungs every 6 (six) hours as needed. For shortness of breath      . albuterol (PROVENTIL) (2.5 MG/3ML) 0.083% nebulizer solution Take 2.5 mg by nebulization every 4 (four) hours as needed. For shortness of breath      .  aspirin 81 MG EC tablet Take 81 mg by mouth daily.      . digoxin (LANOXIN) 0.125 MG tablet Take 125 mcg by mouth daily.      . furosemide (LASIX) 40 MG tablet Take 1 tablet (40 mg total) by mouth 2 (two) times daily.  60 tablet  2  . insulin glargine (LANTUS) 100 UNIT/ML injection Inject 10 Units into the skin daily.  10 mL  1  . Insulin Syringe-Needle U-100 (INSULIN SYRINGE .3CC/31GX5/16") 31G X 5/16" 0.3 ML MISC 1 application by Does not apply route 4 (four) times daily.  120 each  2  . lovastatin (MEVACOR) 40 MG tablet Take 40 mg by mouth at bedtime.      . magnesium oxide (MAG-OX) 400 MG tablet Take 400 mg by mouth daily.        . metolazone (ZAROXOLYN) 2.5 MG tablet Take 1 tablet (2.5 mg total) by mouth daily as  needed (take it daily as needed if your wweight is 145 lbs or greater).  30 tablet  1  . metoprolol succinate (TOPROL XL) 25 MG 24 hr tablet Take 1 tablet (25 mg total) by mouth at bedtime.  30 tablet  1  . nicotine (NICODERM CQ - DOSED IN MG/24 HOURS) 14 mg/24hr patch Place 1 patch onto the skin daily.  28 patch  0  . nitroGLYCERIN (NITROSTAT) 0.4 MG SL tablet Place 0.4 mg under the tongue every 5 (five) minutes as needed. For chest pain      . pantoprazole (PROTONIX) 40 MG tablet Take 40 mg by mouth daily.      . potassium chloride SA (K-DUR,KLOR-CON) 20 MEQ tablet Take 20 mEq by mouth daily.        . sertraline (ZOLOFT) 50 MG tablet Take 50 mg by mouth daily. Take 1/2 tablet for the first 5-7 days then increase to 1 tablet      . spironolactone (ALDACTONE) 25 MG tablet Take 25 mg by mouth daily.      . bd getting started take home kit MISC 1 kit by Other route once.  1 kit  0     PHYSICAL EXAM: Filed Vitals:   10/09/11 1124  BP: 95/58  Pulse: 76  Weight: 145 lb 6.4 oz (65.953 kg)  SpO2: 97%   General: Well appearing.  No resp difficulty HEENT: normal Neck: supple. JVP flat.  Carotids 2+ bilaterally; no bruits. No lymphadenopathy or thryomegaly appreciated. Cor: PMI laterally displaced. Regular rate & rhythm. No rubs, or murmurs.soft  S3  Lungs: Crackles bilateral bases Abdomen: soft, nontender, nondistended.  No hepatosplenomegaly. No bruits or masses. Good bowel sounds. Extremities: no cyanosis, clubbing, rash, edema Neuro: alert & orientedx3, cranial nerves grossly intact. Moves all 4 extremities w/o difficulty. Affect pleasant.  ASSESSMENT & PLAN:

## 2011-10-11 ENCOUNTER — Telehealth: Payer: Self-pay | Admitting: Dietician

## 2011-10-11 NOTE — Telephone Encounter (Deleted)
Needs prescription for diabetes testing supplies for Medicare to cover.

## 2011-10-11 NOTE — Telephone Encounter (Signed)
Note patient was provided with a written prescription for meter while in hospital. Left message for her to call.

## 2011-10-12 NOTE — Telephone Encounter (Signed)
Called again leaving message for patient to call if questions about insulin or diabetes testing supplies. [place] reminded her of  Her appointment on 10/18/11

## 2011-10-16 ENCOUNTER — Telehealth (HOSPITAL_COMMUNITY): Payer: Self-pay | Admitting: Vascular Surgery

## 2011-10-16 NOTE — Telephone Encounter (Signed)
Jessica [name] wants to know if her paper work was filled out and sent out for her disability . Please call her.

## 2011-10-18 ENCOUNTER — Encounter: Payer: Medicare Other | Admitting: Internal Medicine

## 2011-10-18 ENCOUNTER — Ambulatory Visit: Payer: Medicare Other | Admitting: Dietician

## 2011-10-18 NOTE — Telephone Encounter (Signed)
Pt aware disability forms completed and faxed, copy given to her

## 2011-10-29 ENCOUNTER — Encounter (HOSPITAL_COMMUNITY): Payer: Self-pay

## 2011-10-29 ENCOUNTER — Ambulatory Visit (HOSPITAL_COMMUNITY)
Admission: RE | Admit: 2011-10-29 | Discharge: 2011-10-29 | Disposition: A | Discharge status: Home / Self Care | Payer: Medicare Other | Source: Ambulatory Visit | Attending: Internal Medicine | Admitting: Internal Medicine

## 2011-10-29 VITALS — BP 120/74 | HR 86 | Ht 65.0 in | Wt 150.1 lb

## 2011-10-29 DIAGNOSIS — I5022 Chronic systolic (congestive) heart failure: Secondary | ICD-10-CM | POA: Insufficient documentation

## 2011-10-29 DIAGNOSIS — I2510 Atherosclerotic heart disease of native coronary artery without angina pectoris: Secondary | ICD-10-CM

## 2011-10-29 NOTE — Progress Notes (Signed)
HPI:  Jessica [name] is a 51 y.o. female with a history of severe congestive heart failure, secondary ischemic cardiomyopathy with an ejection fraction of 25-30%. She also has a history of coronary artery disease, status post multiple interventions, ICD implantation, diabetes, hyperlipidemia, history of ongoing tobacco and possibly alcohol use.  Status post right iliofemoral embolectomy and compartment fasciotomy of the right leg by Dr. Darrick Penna with LLE DVT 06/2010.    She has a history of noncompliance.  She has inolerance of ACE-I due to severe cough.  Problem tolerating b-blocker and ARB due to low BP.    Cath 02/2011: showed severe 3 vessel CAD with occlusion of multiple stents, EF 10-15%  02/2011 R heart Cath: RA = 18, RV = 67/10/20, PA = 56/30 (41), PCW = 23, Ao = 97/72 (83), LV = 93/20/30, Fick cardiac output/index = 2.8/1.7, PVR = 6.5 Woods  SVR = 1878, FA sat = 88%, PA sat = 42%, 46%,   Echo 02/22/11: Only basal function preserved. EF 20-25%.   Not a candidate for CABG. Discussion with patient about LVAD as an option, however patient declined.  Medical therapy continued.  Coumadin stopped for DVT due to noncompliance with 6 months of completed therapy.     She returns for post hospital follow up.  She feels ok today.  Her weight has been trending up the last couple of days due to soup and hot dog intake.  She has been running 145-146 pounds at home and decided to take a metolazone this morning.  +abdominal distention.  No cough.  Chronic 3 pillow orthopnea.  No PND.  She denies shocks.  No chest pain.   ROS: All systems negative except as listed in HPI, PMH and Problem List.  Past Medical History  Diagnosis Date  . Chronic systolic heart failure     a.  secondary to severe ischemic cardiomyopathy with EF 20-25%;  b. s/p rimplantation of Biotronik single chamber ICD by Dr Ladona Ridgel 02/2008;  c.  Right heart cath 04/2007: RA 18, PA 56/30 (40), PCWP 36. FICK 3.89/2.06 Woods;  d. echo 9/09: Ef 20-25%,  mild MR, mild LAE  . CAD (coronary artery disease)     a. s/p multiple PCIs; b. cath 10/07: LAD stent 40%, dx 80% ISR - tx with POBA; OM stent ok; ostial OM 60%; RCA occluded with L-R collats  . Tobacco abuse   . Alcohol abuse   . History of noncompliance with medical treatment   . Diabetes mellitus, type 2   . Dizziness   . HLD (hyperlipidemia)   . DVT (deep venous thrombosis)     coumadin  . Arterial embolism     s/p right iliofemoral embolectomy with fasciotomy  . V-tach   . AICD (automatic cardioverter/defibrillator) present   . Angina   . Asthma   . Shortness of breath   . Sleep apnea   . CHF (congestive heart failure)   . GERD (gastroesophageal reflux disease)   . Anxiety   . Myocardial infarction   . COPD (chronic obstructive pulmonary disease)     Current Outpatient Prescriptions  Medication Sig Dispense Refill  . albuterol (PROVENTIL HFA;VENTOLIN HFA) 108 (90 BASE) MCG/ACT inhaler Inhale 2 puffs into the lungs every 6 (six) hours as needed. For shortness of breath      . albuterol (PROVENTIL) (2.5 MG/3ML) 0.083% nebulizer solution Take 2.5 mg by nebulization every 4 (four) hours as needed. For shortness of breath      . aspirin 81 MG  EC tablet Take 81 mg by mouth daily.      . digoxin (LANOXIN) 0.125 MG tablet Take 125 mcg by mouth daily.      . furosemide (LASIX) 40 MG tablet Take 1 tablet (40 mg total) by mouth 2 (two) times daily.  60 tablet  2  . insulin glargine (LANTUS) 100 UNIT/ML injection Inject 10 Units into the skin daily.  10 mL  1  . lovastatin (MEVACOR) 40 MG tablet Take 40 mg by mouth at bedtime.      . magnesium oxide (MAG-OX) 400 MG tablet Take 400 mg by mouth daily.        . metolazone (ZAROXOLYN) 2.5 MG tablet Take 1 tablet (2.5 mg total) by mouth daily as needed (take it daily as needed if your wweight is 145 lbs or greater).  30 tablet  1  . metoprolol succinate (TOPROL XL) 25 MG 24 hr tablet Take 1 tablet (25 mg total) by mouth at bedtime.  30  tablet  1  . nitroGLYCERIN (NITROSTAT) 0.4 MG SL tablet Place 0.4 mg under the tongue every 5 (five) minutes as needed. For chest pain      . pantoprazole (PROTONIX) 40 MG tablet Take 40 mg by mouth daily.      . potassium chloride SA (K-DUR,KLOR-CON) 20 MEQ tablet Take 20 mEq by mouth daily.        . sertraline (ZOLOFT) 50 MG tablet Take 50 mg by mouth daily. Take 1/2 tablet for the first 5-7 days then increase to 1 tablet      . spironolactone (ALDACTONE) 25 MG tablet Take 25 mg by mouth daily.      . bd getting started take home kit MISC 1 kit by Other route once.  1 kit  0  . Insulin Syringe-Needle U-100 (INSULIN SYRINGE .3CC/31GX5/16") 31G X 5/16" 0.3 ML MISC 1 application by Does not apply route 4 (four) times daily.  120 each  2  . nicotine (NICODERM CQ - DOSED IN MG/24 HOURS) 14 mg/24hr patch Place 1 patch onto the skin daily.  28 patch  0     PHYSICAL EXAM: Filed Vitals:   10/29/11 1421  BP: 120/74  Pulse: 86  Height: 5\' 5"  (1.651 m)  Weight: 150 lb 1.9 oz (68.094 kg)  SpO2: 95%   General: Well appearing.  No resp difficulty HEENT: normal Neck: supple. JVP 8-9.  Carotids 2+ bilaterally; no bruits. No lymphadenopathy or thryomegaly appreciated. Cor: PMI laterally displaced. Regular rate & rhythm. soft  S3  Lungs: Clear Abdomen: soft, nontender, + distention.  No hepatosplenomegaly. No bruits or masses. Good bowel sounds. Extremities: no cyanosis, clubbing, rash, trace edema Neuro: alert & orientedx3, cranial nerves grossly intact. Moves all 4 extremities w/o difficulty. Affect pleasant.  ASSESSMENT & PLAN:

## 2011-10-29 NOTE — Assessment & Plan Note (Signed)
No ischemic symptoms, continue current therapy. 

## 2011-10-29 NOTE — Assessment & Plan Note (Signed)
NYHA III.  Volume status trending up.  Will take metolazone 2.5 mg and an extra 20 mEq of potassium for the next 2-3 days to get weight closer to 142 pounds.  Will also increase lasix 80/40 mg.  Have reinforced low sodium diet and discussed low sodium choices, she voices understanding.  Also discussed 2 liter fluid restriction as she feel her fluid intake has also increased.  If weight does not trend down she will call the clinic.  Follow up 10-14 days.

## 2011-10-29 NOTE — Patient Instructions (Addendum)
Increase lasix 80 mg (2 tabs) in morning and 40 mg (1 tab) in the evening.  Take metolazone 2.5 mg and extra potassium tablet for weight 145 pounds or more.  Call if you need to take metolazone more than 3 days in a row.   Follow up 2 weeks with labs.

## 2011-10-31 ENCOUNTER — Encounter (HOSPITAL_COMMUNITY): Payer: Medicare Other

## 2011-11-15 ENCOUNTER — Encounter (HOSPITAL_COMMUNITY): Payer: Self-pay

## 2011-11-15 ENCOUNTER — Ambulatory Visit (HOSPITAL_COMMUNITY)
Admission: RE | Admit: 2011-11-15 | Discharge: 2011-11-15 | Disposition: A | Discharge status: Home / Self Care | Payer: Medicare Other | Source: Ambulatory Visit | Attending: Internal Medicine | Admitting: Internal Medicine

## 2011-11-15 VITALS — BP 110/70 | HR 78 | Ht 63.0 in | Wt 152.8 lb

## 2011-11-15 DIAGNOSIS — I2510 Atherosclerotic heart disease of native coronary artery without angina pectoris: Secondary | ICD-10-CM | POA: Insufficient documentation

## 2011-11-15 DIAGNOSIS — I5022 Chronic systolic (congestive) heart failure: Secondary | ICD-10-CM | POA: Insufficient documentation

## 2011-11-15 DIAGNOSIS — I509 Heart failure, unspecified: Secondary | ICD-10-CM | POA: Insufficient documentation

## 2011-11-15 LAB — BASIC METABOLIC PANEL
BUN: 22 mg/dL (ref 6–23)
Creatinine, Ser: 0.86 mg/dL (ref 0.50–1.10)
GFR calc non Af Amer: 76 mL/min — ABNORMAL LOW (ref >90)
Glucose, Bld: 236 mg/dL — ABNORMAL HIGH (ref 70–99)
Potassium: 3.1 mEq/L — ABNORMAL LOW (ref 3.5–5.1)

## 2011-11-15 MED ORDER — FUROSEMIDE 40 MG PO TABS: 80.0000 mg | ORAL_TABLET | Freq: Every day | ORAL | Status: DC

## 2011-11-15 NOTE — Assessment & Plan Note (Signed)
NYHA III. Dyspnea with exertion. Weight up 11 pounds from baseline. She has not been compliant with medications due to her financial situation. She has been out of spironolactone and potassium for the last week. I have asked her to take lasix 80 mg once daily. Reviewed most recent lab results. I am hesitant to increase diuretics until repeat BMET obtained today. Reinforced daily weights and limiting fluids to less than 2 liters per day. Follow up next week. If volume status does not improve may need hospital admit.

## 2011-11-15 NOTE — Progress Notes (Signed)
Patient ID: Jessica [name], female   DOB: 1960-05-16, 51 y.o.   MRN: [id number] PCP: Dr Dierdre Searles  HPI: [name] is a 51 y.o. female with a history of severe congestive heart failure, secondary ischemic cardiomyopathy with an ejection fraction of 25-30%. She also has a history of coronary artery disease, status post multiple interventions, ICD implantation, diabetes, hyperlipidemia, history of ongoing tobacco and possibly alcohol use.  Status post right iliofemoral embolectomy and compartment fasciotomy of the right leg by Dr. Darrick Penna with LLE DVT 06/2010.    She has a history of noncompliance.  She has inolerance of ACE-I due to severe cough.  Problem tolerating b-blocker and ARB due to low BP.    Cath 02/2011: showed severe 3 vessel CAD with occlusion of multiple stents, EF 10-15%  02/2011 R heart Cath: RA = 18, RV = 67/10/20, PA = 56/30 (41), PCW = 23, Ao = 97/72 (83), LV = 93/20/30, Fick cardiac output/index = 2.8/1.7, PVR = 6.5 Woods  SVR = 1878, FA sat = 88%, PA sat = 42%, 46%,   Echo 02/22/11: Only basal function preserved. EF 20-25%.   Not a candidate for CABG. Discussion with patient about LVAD as an option, however patient declined.  Medical therapy continued.  Coumadin stopped for DVT due to noncompliance with 6 months of completed therapy.     She returns for follow up.  Last visit given  metolazone 2.5 mg and an extra 20 mEq of potassium 2 days and lasix increased to lasix 80/40 mg. Mild dyspnea when ambulating and performing daily chores. Weight at home 142-150 pounds. She is taking Metolazone 3-4 days a week. Sleeps on 4 pillows. Denies shocks. Smokes 2-3 cigarettes per day. Eating hot dogs. Drinking < 2 liters per day. She ran out of spironolactone and potassium over a week ago. She is unable to pay for medications. She has not taken any medications today.     ROS: All systems negative except as listed in HPI, PMH and Problem List.  Past Medical History  Diagnosis Date  . Chronic  systolic heart failure     a.  secondary to severe ischemic cardiomyopathy with EF 20-25%;  b. s/p rimplantation of Biotronik single chamber ICD by Dr Ladona Ridgel 02/2008;  c.  Right heart cath 04/2007: RA 18, PA 56/30 (40), PCWP 36. FICK 3.89/2.06 Woods;  d. echo 9/09: Ef 20-25%, mild MR, mild LAE  . CAD (coronary artery disease)     a. s/p multiple PCIs; b. cath 10/07: LAD stent 40%, dx 80% ISR - tx with POBA; OM stent ok; ostial OM 60%; RCA occluded with L-R collats  . Tobacco abuse   . Alcohol abuse   . History of noncompliance with medical treatment   . Diabetes mellitus, type 2   . Dizziness   . HLD (hyperlipidemia)   . DVT (deep venous thrombosis)     coumadin  . Arterial embolism     s/p right iliofemoral embolectomy with fasciotomy  . V-tach   . AICD (automatic cardioverter/defibrillator) present   . Angina   . Asthma   . Shortness of breath   . Sleep apnea   . CHF (congestive heart failure)   . GERD (gastroesophageal reflux disease)   . Anxiety   . Myocardial infarction   . COPD (chronic obstructive pulmonary disease)     Current Outpatient Prescriptions  Medication Sig Dispense Refill  . albuterol (PROVENTIL) (2.5 MG/3ML) 0.083% nebulizer solution Take 2.5 mg by nebulization every 4 (four) hours as  needed. For shortness of breath      . aspirin 81 MG EC tablet Take 81 mg by mouth daily.      . digoxin (LANOXIN) 0.125 MG tablet Take 125 mcg by mouth daily.      . furosemide (LASIX) 40 MG tablet Take 1 tablet (40 mg total) by mouth 2 (two) times daily.  60 tablet  2  . lovastatin (MEVACOR) 40 MG tablet Take 40 mg by mouth at bedtime.      . magnesium oxide (MAG-OX) 400 MG tablet Take 400 mg by mouth daily.        . metolazone (ZAROXOLYN) 2.5 MG tablet Take 1 tablet (2.5 mg total) by mouth daily as needed (take it daily as needed if your wweight is 145 lbs or greater).  30 tablet  1  . metoprolol succinate (TOPROL XL) 25 MG 24 hr tablet Take 1 tablet (25 mg total) by mouth at  bedtime.  30 tablet  1  . nitroGLYCERIN (NITROSTAT) 0.4 MG SL tablet Place 0.4 mg under the tongue every 5 (five) minutes as needed. For chest pain      . pantoprazole (PROTONIX) 40 MG tablet Take 40 mg by mouth daily.      . potassium chloride SA (K-DUR,KLOR-CON) 20 MEQ tablet Take 20 mEq by mouth daily.        . sertraline (ZOLOFT) 50 MG tablet Take 50 mg by mouth daily. Take 1/2 tablet for the first 5-7 days then increase to 1 tablet      . spironolactone (ALDACTONE) 25 MG tablet Take 25 mg by mouth daily.      [name] albuterol (PROVENTIL HFA;VENTOLIN HFA) 108 (90 BASE) MCG/ACT inhaler Inhale 2 puffs into the lungs every 6 (six) hours as needed. For shortness of breath      . bd getting started take home kit MISC 1 kit by Other route once.  1 kit  0  . insulin glargine (LANTUS) 100 UNIT/ML injection Inject 10 Units into the skin daily.  10 mL  1  . Insulin Syringe-Needle U-100 (INSULIN SYRINGE .3CC/31GX5/16") 31G X 5/16" 0.3 ML MISC 1 application by Does not apply route 4 (four) times daily.  120 each  2     PHYSICAL EXAM: Filed Vitals:   11/15/11 1525  BP: 110/70  Pulse: 78  Height: 5\' 3"  (1.6 m)  Weight: 152 lb 12.8 oz (69.31 kg)  SpO2: 96%   General: Tearful Chronically ill appearing.  No resp difficulty HEENT: normal Neck: supple. JVP 10-11  Carotids 2+ bilaterally; no bruits. No lymphadenopathy or thryomegaly appreciated. Cor: PMI laterally displaced. Regular rate & rhythm. soft  S3  Lungs: Clear Abdomen: soft, nontender, + distention.  No hepatosplenomegaly. No bruits or masses. Good bowel sounds. Extremities: no cyanosis, clubbing, rash, trace edema Neuro: alert & orientedx3, cranial nerves grossly intact. Moves all 4 extremities w/o difficulty. Affect pleasant.  ASSESSMENT & PLAN:

## 2011-11-15 NOTE — Assessment & Plan Note (Signed)
No evidence of ischemia. Continue current regimen.   

## 2011-11-15 NOTE — Patient Instructions (Signed)
Take lasix 80 mg in am

## 2011-11-21 ENCOUNTER — Encounter (HOSPITAL_COMMUNITY): Payer: Self-pay

## 2011-11-21 ENCOUNTER — Ambulatory Visit (HOSPITAL_COMMUNITY)
Admission: RE | Admit: 2011-11-21 | Discharge: 2011-11-21 | Disposition: A | Discharge status: Home / Self Care | Payer: Medicare Other | Source: Ambulatory Visit | Attending: Internal Medicine | Admitting: Internal Medicine

## 2011-11-21 VITALS — BP 110/76 | HR 89 | Ht 64.0 in | Wt 149.8 lb

## 2011-11-21 DIAGNOSIS — I509 Heart failure, unspecified: Secondary | ICD-10-CM | POA: Insufficient documentation

## 2011-11-21 DIAGNOSIS — N179 Acute kidney failure, unspecified: Secondary | ICD-10-CM | POA: Insufficient documentation

## 2011-11-21 DIAGNOSIS — N189 Chronic kidney disease, unspecified: Secondary | ICD-10-CM

## 2011-11-21 DIAGNOSIS — I5022 Chronic systolic (congestive) heart failure: Secondary | ICD-10-CM

## 2011-11-21 DIAGNOSIS — I2510 Atherosclerotic heart disease of native coronary artery without angina pectoris: Secondary | ICD-10-CM

## 2011-11-21 LAB — BASIC METABOLIC PANEL
BUN: 13 mg/dL (ref 6–23)
Calcium: 9.8 mg/dL (ref 8.4–10.5)
Chloride: 97 mEq/L (ref 96–112)
Creatinine, Ser: 0.89 mg/dL (ref 0.50–1.10)
GFR calc Af Amer: 84 mL/min — ABNORMAL LOW (ref >90)

## 2011-11-21 MED ORDER — METOLAZONE 2.5 MG PO TABS: ORAL_TABLET | ORAL | Status: DC

## 2011-11-21 NOTE — Patient Instructions (Addendum)
Take metolazone 2.5 mg every Monday and Friday with extra dose of potassium  Can try robitussin for drainage.  Follow up 2 weeks.   Labs today

## 2011-11-21 NOTE — Assessment & Plan Note (Signed)
No ischemic symptoms, continue current therapy. 

## 2011-11-21 NOTE — Progress Notes (Signed)
PCP: Dr Dierdre Searles  HPI: Jessica [name] is a 51 y.o. female with a history of severe congestive heart failure, secondary ischemic cardiomyopathy with an ejection fraction of 25-30%. She also has a history of coronary artery disease, status post multiple interventions, ICD implantation, diabetes, hyperlipidemia, history of ongoing tobacco and possibly alcohol use.  Status post right iliofemoral embolectomy and compartment fasciotomy of the right leg by Dr. Darrick Penna with LLE DVT 06/2010.    She has a history of noncompliance.  She has inolerance of ACE-I due to severe cough.  Problem tolerating b-blocker and ARB due to low BP.    Cath 02/2011: showed severe 3 vessel CAD with occlusion of multiple stents, EF 10-15%  02/2011 R heart Cath: RA = 18, RV = 67/10/20, PA = 56/30 (41), PCW = 23, Ao = 97/72 (83), LV = 93/20/30, Fick cardiac output/index = 2.8/1.7, PVR = 6.5 Woods  SVR = 1878, FA sat = 88%, PA sat = 42%, 46%,   Echo 02/22/11: Only basal function preserved. EF 20-25%.   Not a candidate for CABG. Discussion with patient about LVAD as an option, however patient declined.  Medical therapy continued.  Coumadin stopped for DVT due to noncompliance with 6 months of completed therapy.     She returns for follow up.   Feels better.  Spirits up.  Has all her meds and is compliant.  Has taken 2 doses of metolazone since last week.  Feels her dyspnea has improved.  Weight 144-146 at home.  Chronic orthopnea.  No PND.  Did not follow fluid restrictions yesterday. No ICD shocks.  No chest pain.  Able to vacuum, sweep and wash the dishes without dyspnea but +fatigue.  ROS: All systems negative except as listed in HPI, PMH and Problem List.  Past Medical History  Diagnosis Date  . Chronic systolic heart failure     a.  secondary to severe ischemic cardiomyopathy with EF 20-25%;  b. s/p rimplantation of Biotronik single chamber ICD by Dr Ladona Ridgel 02/2008;  c.  Right heart cath 04/2007: RA 18, PA 56/30 (40), PCWP 36. FICK  3.89/2.06 Woods;  d. echo 9/09: Ef 20-25%, mild MR, mild LAE  . CAD (coronary artery disease)     a. s/p multiple PCIs; b. cath 10/07: LAD stent 40%, dx 80% ISR - tx with POBA; OM stent ok; ostial OM 60%; RCA occluded with L-R collats  . Tobacco abuse   . Alcohol abuse   . History of noncompliance with medical treatment   . Diabetes mellitus, type 2   . Dizziness   . HLD (hyperlipidemia)   . DVT (deep venous thrombosis)     coumadin  . Arterial embolism     s/p right iliofemoral embolectomy with fasciotomy  . V-tach   . AICD (automatic cardioverter/defibrillator) present   . Angina   . Asthma   . Shortness of breath   . Sleep apnea   . CHF (congestive heart failure)   . GERD (gastroesophageal reflux disease)   . Anxiety   . Myocardial infarction   . COPD (chronic obstructive pulmonary disease)     Current Outpatient Prescriptions  Medication Sig Dispense Refill  . albuterol (PROVENTIL HFA;VENTOLIN HFA) 108 (90 BASE) MCG/ACT inhaler Inhale 2 puffs into the lungs every 6 (six) hours as needed. For shortness of breath      . albuterol (PROVENTIL) (2.5 MG/3ML) 0.083% nebulizer solution Take 2.5 mg by nebulization every 4 (four) hours as needed. For shortness of breath      .  aspirin 81 MG EC tablet Take 81 mg by mouth daily.      . bd getting started take home kit MISC 1 kit by Other route once.  1 kit  0  . digoxin (LANOXIN) 0.125 MG tablet Take 125 mcg by mouth daily.      . furosemide (LASIX) 40 MG tablet Take 2 tablets (80 mg total) by mouth daily.  60 tablet  3  . insulin glargine (LANTUS) 100 UNIT/ML injection Inject 10 Units into the skin daily.  10 mL  1  . Insulin Syringe-Needle U-100 (INSULIN SYRINGE .3CC/31GX5/16") 31G X 5/16" 0.3 ML MISC 1 application by Does not apply route 4 (four) times daily.  120 each  2  . lovastatin (MEVACOR) 40 MG tablet Take 40 mg by mouth at bedtime.      . magnesium oxide (MAG-OX) 400 MG tablet Take 400 mg by mouth daily.        . metolazone  (ZAROXOLYN) 2.5 MG tablet Take 1 tablet (2.5 mg total) by mouth daily as needed (take it daily as needed if your wweight is 145 lbs or greater).  30 tablet  1  . metoprolol succinate (TOPROL XL) 25 MG 24 hr tablet Take 1 tablet (25 mg total) by mouth at bedtime.  30 tablet  1  . nitroGLYCERIN (NITROSTAT) 0.4 MG SL tablet Place 0.4 mg under the tongue every 5 (five) minutes as needed. For chest pain      . pantoprazole (PROTONIX) 40 MG tablet Take 40 mg by mouth daily.      . potassium chloride SA (K-DUR,KLOR-CON) 20 MEQ tablet Take 20 mEq by mouth daily.        . sertraline (ZOLOFT) 50 MG tablet Take 50 mg by mouth daily. Take 1/2 tablet for the first 5-7 days then increase to 1 tablet      . spironolactone (ALDACTONE) 25 MG tablet Take 25 mg by mouth daily.         PHYSICAL EXAM: Filed Vitals:   11/21/11 1027  BP: 110/76  Pulse: 89  Height: 5\' 4"  (1.626 m)  Weight: 149 lb 12.8 oz (67.949 kg)  SpO2: 94%   General: Chronically ill appearing.  No resp difficulty HEENT: normal Neck: supple. JVP 6-7  Carotids 2+ bilaterally; no bruits. No lymphadenopathy or thryomegaly appreciated. Cor: PMI laterally displaced. Regular rate & rhythm. soft  S3  Lungs: Clear Abdomen: soft, nontender, mild distention.  No hepatosplenomegaly. No bruits or masses. Good bowel sounds. Extremities: no cyanosis, clubbing, rash, trace edema Neuro: alert & orientedx3, cranial nerves grossly intact. Moves all 4 extremities w/o difficulty. Affect pleasant.  ASSESSMENT & PLAN:

## 2011-11-21 NOTE — Assessment & Plan Note (Signed)
Cr has returned to normal per last weeks labs.  Will follow closely.

## 2011-11-21 NOTE — Assessment & Plan Note (Signed)
Volume status improving.  Will continue current lasix dose and add metolazone 2.5 mg on Monday and Friday with potassium.  She can take extra as needed.  Have encouraged her to try and walk for 10 min a day as she feels that she sits around all day and becomes depressed.  She says she feels well enough to do some activity.  She will also watch fluid intake.  Labs today.

## 2011-11-28 ENCOUNTER — Other Ambulatory Visit: Payer: Self-pay | Admitting: Cardiovascular Disease

## 2011-12-11 ENCOUNTER — Encounter (HOSPITAL_COMMUNITY): Payer: Self-pay

## 2011-12-11 ENCOUNTER — Ambulatory Visit (HOSPITAL_COMMUNITY)
Admission: RE | Admit: 2011-12-11 | Discharge: 2011-12-11 | Disposition: A | Discharge status: Home / Self Care | Payer: Medicare Other | Source: Ambulatory Visit | Attending: Internal Medicine | Admitting: Internal Medicine

## 2011-12-11 VITALS — BP 106/72 | HR 88 | Ht 65.0 in | Wt 154.1 lb

## 2011-12-11 DIAGNOSIS — I509 Heart failure, unspecified: Secondary | ICD-10-CM | POA: Insufficient documentation

## 2011-12-11 DIAGNOSIS — I2510 Atherosclerotic heart disease of native coronary artery without angina pectoris: Secondary | ICD-10-CM

## 2011-12-11 DIAGNOSIS — I5022 Chronic systolic (congestive) heart failure: Secondary | ICD-10-CM | POA: Insufficient documentation

## 2011-12-11 NOTE — Assessment & Plan Note (Signed)
NYHA IIIb. Volume status elevated. Weight continues to trend up. Weight up 9-10 pounds from baseline 144-145 pounds. This is a difficult situation due to noncompliance with medications and low salt food choices. Offered IV lasix today however she declined and agreed to follow up in am for IV lasix. Reinforced medication compliance and low salt food choices.

## 2011-12-11 NOTE — Progress Notes (Signed)
Patient ID: Jessica [name], female   DOB: [birth year], 51 y.o.   MRN: [id number] PCP: Dr Dierdre Searles  HPI: Jessica [name] is a 51 y.o. female with a history of severe congestive heart failure, secondary ischemic cardiomyopathy with an ejection fraction of 25-30%. She also has a history of coronary artery disease, status post multiple interventions, ICD implantation, diabetes, hyperlipidemia, history of ongoing tobacco and possibly alcohol use.  Status post right iliofemoral embolectomy and compartment fasciotomy of the right leg by Dr. Darrick Penna with LLE DVT 06/2010.    She has a history of noncompliance.  She has inolerance of ACE-I due to severe cough.  Problem tolerating b-blocker and ARB due to low BP.    Cath 02/2011: showed severe 3 vessel CAD with occlusion of multiple stents, EF 10-15%  02/2011 R heart Cath: RA = 18, RV = 67/10/20, PA = 56/30 (41), PCW = 23, Ao = 97/72 (83), LV = 93/20/30, Fick cardiac output/index = 2.8/1.7, PVR = 6.5 Woods  SVR = 1878, FA sat = 88%, PA sat = 42%, 46%,   ECHO 02/22/11: Only basal function preserved. EF 20-25%.   Not a candidate for CABG. Discussion with patient about LVAD as an option, however patient declined.  Medical therapy continued.  Coumadin stopped for DVT due to noncompliance with 6 months of completed therapy.     She returns for follow up.   Denies PND/CP. + Orthpnea/Dyspnea . Complains of cough. mpliant.  Has taken 2 doses of metolazone since last week.  Weight at home 149. Did not follow fluid restrictions yesterday. No ICD shocks.  Liberalized diet and eating sausage and bacon. She missed all medications on Saturday, Sunday,  and  Monday.   ROS: All systems negative except as listed in HPI, PMH and Problem List.  Past Medical History  Diagnosis Date  . Chronic systolic heart failure     a.  secondary to severe ischemic cardiomyopathy with EF 20-25%;  b. s/p rimplantation of Biotronik single chamber ICD by Dr Ladona Ridgel 02/2008;  c.  Right heart cath 04/2007: RA  18, PA 56/30 (40), PCWP 36. FICK 3.89/2.06 Woods;  d. echo 9/09: Ef 20-25%, mild MR, mild LAE  . CAD (coronary artery disease)     a. s/p multiple PCIs; b. cath 10/07: LAD stent 40%, dx 80% ISR - tx with POBA; OM stent ok; ostial OM 60%; RCA occluded with L-R collats  . Tobacco abuse   . Alcohol abuse   . History of noncompliance with medical treatment   . Diabetes mellitus, type 2   . Dizziness   . HLD (hyperlipidemia)   . DVT (deep venous thrombosis)     coumadin  . Arterial embolism     s/p right iliofemoral embolectomy with fasciotomy  . V-tach   . AICD (automatic cardioverter/defibrillator) present   . Angina   . Asthma   . Shortness of breath   . Sleep apnea   . CHF (congestive heart failure)   . GERD (gastroesophageal reflux disease)   . Anxiety   . Myocardial infarction   . COPD (chronic obstructive pulmonary disease)     Current Outpatient Prescriptions  Medication Sig Dispense Refill  . albuterol (PROVENTIL HFA;VENTOLIN HFA) 108 (90 BASE) MCG/ACT inhaler Inhale 2 puffs into the lungs every 6 (six) hours as needed. For shortness of breath      . albuterol (PROVENTIL) (2.5 MG/3ML) 0.083% nebulizer solution Take 2.5 mg by nebulization every 4 (four) hours as needed. For shortness of breath      .  aspirin 81 MG EC tablet Take 81 mg by mouth daily.      . bd getting started take home kit MISC 1 kit by Other route once.  1 kit  0  . digoxin (LANOXIN) 0.125 MG tablet Take 125 mcg by mouth daily.      . furosemide (LASIX) 40 MG tablet Take 2 tablets (80 mg total) by mouth daily.  60 tablet  3  . insulin glargine (LANTUS) 100 UNIT/ML injection Inject 10 Units into the skin daily.  10 mL  1  . Insulin Syringe-Needle U-100 (INSULIN SYRINGE .3CC/31GX5/16") 31G X 5/16" 0.3 ML MISC 1 application by Does not apply route 4 (four) times daily.  120 each  2  . lovastatin (MEVACOR) 40 MG tablet Take 40 mg by mouth at bedtime.      . magnesium oxide (MAG-OX) 400 MG tablet Take 400 mg by  mouth daily.        . metolazone (ZAROXOLYN) 2.5 MG tablet Take 1 tab every Monday and Friday  30 tablet  1  . metoprolol succinate (TOPROL XL) 25 MG 24 hr tablet Take 1 tablet (25 mg total) by mouth at bedtime.  30 tablet  1  . nitroGLYCERIN (NITROSTAT) 0.4 MG SL tablet Place 0.4 mg under the tongue every 5 (five) minutes as needed. For chest pain      . pantoprazole (PROTONIX) 40 MG tablet Take 40 mg by mouth daily.      . potassium chloride SA (K-DUR,KLOR-CON) 20 MEQ tablet Take 20 mEq by mouth daily.        . sertraline (ZOLOFT) 50 MG tablet Take 50 mg by mouth daily. Take 1/2 tablet for the first 5-7 days then increase to 1 tablet      . spironolactone (ALDACTONE) 25 MG tablet Take 25 mg by mouth daily.         PHYSICAL EXAM: Filed Vitals:   12/11/11 1047  BP: 106/72  Pulse: 88  Height: 5\' 5"  (1.651 m)  Weight: 154 lb 1.9 oz (69.908 kg)  SpO2: 97%  154 (152) General: Chronically ill appearing.  No resp difficulty HEENT:  normal Neck: supple. JVP to jaw   Carotids 2+ bilaterally; no bruits. No lymphadenopathy or thryomegaly appreciated. Cor: PMI laterally displaced. Regular rate & rhythm. soft  S3  Lungs: Clear Abdomen: soft, nontender,  distended.  No hepatosplenomegaly. No bruits or masses. Good bowel sounds. Extremities: no cyanosis, clubbing, rash, RLE/ LLE 1+ edema Neuro: alert & orientedx3, cranial nerves grossly intact. Moves all 4 extremities w/o difficulty. Affect pleasant.  ASSESSMENT & PLAN:

## 2011-12-11 NOTE — Patient Instructions (Addendum)
Follow up tomorrow at 9:45

## 2011-12-11 NOTE — Assessment & Plan Note (Signed)
No evidence of ischemia. Continue current regimen.   

## 2011-12-12 ENCOUNTER — Encounter (HOSPITAL_COMMUNITY): Payer: Self-pay

## 2011-12-12 ENCOUNTER — Ambulatory Visit (HOSPITAL_COMMUNITY)
Admission: RE | Admit: 2011-12-12 | Discharge: 2011-12-12 | Disposition: A | Discharge status: Home / Self Care | Payer: Medicare Other | Source: Ambulatory Visit | Attending: Internal Medicine | Admitting: Internal Medicine

## 2011-12-12 VITALS — BP 93/68 | HR 88 | Wt 154.0 lb

## 2011-12-12 DIAGNOSIS — I5022 Chronic systolic (congestive) heart failure: Secondary | ICD-10-CM | POA: Insufficient documentation

## 2011-12-12 DIAGNOSIS — I2510 Atherosclerotic heart disease of native coronary artery without angina pectoris: Secondary | ICD-10-CM

## 2011-12-12 LAB — BASIC METABOLIC PANEL
CO2: 27 mEq/L (ref 19–32)
Calcium: 9.4 mg/dL (ref 8.4–10.5)
Creatinine, Ser: 1.01 mg/dL (ref 0.50–1.10)
GFR calc non Af Amer: 62 mL/min — ABNORMAL LOW (ref >90)
Glucose, Bld: 229 mg/dL — ABNORMAL HIGH (ref 70–99)

## 2011-12-12 LAB — PRO B NATRIURETIC PEPTIDE: Pro B Natriuretic peptide (BNP): 3083 pg/mL — ABNORMAL HIGH (ref 0–125)

## 2011-12-12 MED ORDER — POTASSIUM CHLORIDE CRYS ER 20 MEQ PO TBCR
80.0000 meq | EXTENDED_RELEASE_TABLET | Freq: Once | ORAL | Status: AC
  Administered 2011-12-12: 80 meq via ORAL

## 2011-12-12 MED ORDER — FUROSEMIDE 10 MG/ML IJ SOLN
80.0000 mg | Freq: Once | INTRAMUSCULAR | Status: AC
  Administered 2011-12-12: 80 mg via INTRAVENOUS

## 2011-12-12 NOTE — Progress Notes (Signed)
Pt urinated 300cc

## 2011-12-12 NOTE — Progress Notes (Signed)
Pt urinated 400 cc, she was given 80 meq of Potassium and took them without any complications

## 2011-12-12 NOTE — Assessment & Plan Note (Addendum)
Volume status remains elevated. Weight unchanged and remains 9-10 pounds from baseline. Will give IV lasix 80 mg now in clinic. Check BNP and BMET today. She will resume Lasix 80 mg daily and metolazone 2.5 mg twice week on Monday and Friday.  Reviewed BMET and BNP. BNP elevated 3083.  Potassium down to 3.2 and given 80 meq of potassium. Reinforced need for daily weights and reviewed use of sliding scale diuretics. Follow up next week to reassess volume status.

## 2011-12-12 NOTE — Patient Instructions (Addendum)
Follow up 12/20/11  Please take all your medications  Do the following things EVERYDAY: 1) Weigh yourself in the morning before breakfast. Write it down and keep it in a log. 2) Take your medicines as prescribed 3) Eat low salt foods-Limit salt (sodium) to 2000 mg per day.  4) Stay as active as you can everyday 5) Limit all fluids for the day to less than 2 liters

## 2011-12-12 NOTE — Progress Notes (Signed)
22 gauge IV started in left forearm by Scarlette Calico, RN.  80 mg of IV Lasix given at 10:40 am pt tolerated well with no complication.

## 2011-12-15 NOTE — Assessment & Plan Note (Signed)
No evidence of ischemia. Continue current regimen.   

## 2011-12-15 NOTE — Progress Notes (Signed)
Patient ID: Jessica [name], female   DOB: [birth year], 51 y.o.   MRN: [id number] PCP: Dr Dierdre Searles  HPI: Jessica [name] is a 51 y.o. female with a history of severe congestive heart failure, secondary ischemic cardiomyopathy with an ejection fraction of 25-30%. She also has a history of coronary artery disease, status post multiple interventions, ICD implantation, diabetes, hyperlipidemia, history of ongoing tobacco and possibly alcohol use.  Status post right iliofemoral embolectomy and compartment fasciotomy of the right leg by Dr. Darrick Penna with LLE DVT 06/2010.    She has a history of noncompliance.  She has inolerance of ACE-I due to severe cough.  Problem tolerating b-blocker and ARB due to low BP.    Cath 02/2011: showed severe 3 vessel CAD with occlusion of multiple stents, EF 10-15%  02/2011 R heart Cath: RA = 18, RV = 67/10/20, PA = 56/30 (41), PCW = 23, Ao = 97/72 (83), LV = 93/20/30, Fick cardiac output/index = 2.8/1.7, PVR = 6.5 Woods  SVR = 1878, FA sat = 88%, PA sat = 42%, 46%,   ECHO 02/22/11: Only basal function preserved. EF 20-25%.   Not a candidate for CABG. Discussion with patient about LVAD as an option, however patient declined.  Medical therapy continued.  Coumadin stopped for DVT due to noncompliance with 6 months of completed therapy.     She returns for follow up.   Weight is up 10 pounds. Remains SOB and orthopneic. Denies PND/CP. She has all medications. Reports compliance.  ROS: All systems negative except as listed in HPI, PMH and Problem List.  Past Medical History  Diagnosis Date  . Chronic systolic heart failure     a.  secondary to severe ischemic cardiomyopathy with EF 20-25%;  b. s/p rimplantation of Biotronik single chamber ICD by Dr Ladona Ridgel 02/2008;  c.  Right heart cath 04/2007: RA 18, PA 56/30 (40), PCWP 36. FICK 3.89/2.06 Woods;  d. echo 9/09: Ef 20-25%, mild MR, mild LAE  . CAD (coronary artery disease)     a. s/p multiple PCIs; b. cath 10/07: LAD stent 40%, dx 80% ISR -  tx with POBA; OM stent ok; ostial OM 60%; RCA occluded with L-R collats  . Tobacco abuse   . Alcohol abuse   . History of noncompliance with medical treatment   . Diabetes mellitus, type 2   . Dizziness   . HLD (hyperlipidemia)   . DVT (deep venous thrombosis)     coumadin  . Arterial embolism     s/p right iliofemoral embolectomy with fasciotomy  . V-tach   . AICD (automatic cardioverter/defibrillator) present   . Angina   . Asthma   . Shortness of breath   . Sleep apnea   . CHF (congestive heart failure)   . GERD (gastroesophageal reflux disease)   . Anxiety   . Myocardial infarction   . COPD (chronic obstructive pulmonary disease)     Current Outpatient Prescriptions  Medication Sig Dispense Refill  . albuterol (PROVENTIL HFA;VENTOLIN HFA) 108 (90 BASE) MCG/ACT inhaler Inhale 2 puffs into the lungs every 6 (six) hours as needed. For shortness of breath      . albuterol (PROVENTIL) (2.5 MG/3ML) 0.083% nebulizer solution Take 2.5 mg by nebulization every 4 (four) hours as needed. For shortness of breath      . aspirin 81 MG EC tablet Take 81 mg by mouth daily.      . bd getting started take home kit MISC 1 kit by Other route once.  1  kit  0  . digoxin (LANOXIN) 0.125 MG tablet Take 125 mcg by mouth daily.      . furosemide (LASIX) 40 MG tablet Take 2 tablets (80 mg total) by mouth daily.  60 tablet  3  . insulin glargine (LANTUS) 100 UNIT/ML injection Inject 10 Units into the skin daily.  10 mL  1  . Insulin Syringe-Needle U-100 (INSULIN SYRINGE .3CC/31GX5/16") 31G X 5/16" 0.3 ML MISC 1 application by Does not apply route 4 (four) times daily.  120 each  2  . lovastatin (MEVACOR) 40 MG tablet Take 40 mg by mouth at bedtime.      . magnesium oxide (MAG-OX) 400 MG tablet Take 400 mg by mouth daily.        . metolazone (ZAROXOLYN) 2.5 MG tablet Take 1 tab every Monday and Friday  30 tablet  1  . metoprolol succinate (TOPROL XL) 25 MG 24 hr tablet Take 1 tablet (25 mg total) by  mouth at bedtime.  30 tablet  1  . nitroGLYCERIN (NITROSTAT) 0.4 MG SL tablet Place 0.4 mg under the tongue every 5 (five) minutes as needed. For chest pain      . pantoprazole (PROTONIX) 40 MG tablet Take 40 mg by mouth daily.      . potassium chloride SA (K-DUR,KLOR-CON) 20 MEQ tablet Take 20 mEq by mouth daily.        . sertraline (ZOLOFT) 50 MG tablet Take 50 mg by mouth daily.       [name] spironolactone (ALDACTONE) 25 MG tablet Take 25 mg by mouth daily.         PHYSICAL EXAM: Filed Vitals:   12/12/11 1015 12/12/11 1037 12/12/11 1052  BP: 98/62 107/87 93/68  Pulse: 88 86 88  Weight: 154 lb (69.854 kg)    SpO2: 97%    154 (154) General: Chronically ill appearing.  No resp difficulty HEENT:  normal Neck: supple. JVP to jaw   Carotids 2+ bilaterally; no bruits. No lymphadenopathy or thryomegaly appreciated. Cor: PMI laterally displaced. Regular rate & rhythm. soft  S3  Lungs: Clear Abdomen: soft, nontender,  distended.  No hepatosplenomegaly. No bruits or masses. Good bowel sounds. Extremities: no cyanosis, clubbing, rash, RLE/ LLE 1+ edema Neuro: alert & orientedx3, cranial nerves grossly intact. Moves all 4 extremities w/o difficulty. Affect pleasant. Skin: Excoriations noted on her back with 3 partial thickness wounds noted 2x2 cm  ASSESSMENT & PLAN:

## 2011-12-20 ENCOUNTER — Encounter (HOSPITAL_COMMUNITY): Payer: Self-pay

## 2011-12-20 ENCOUNTER — Inpatient Hospital Stay (HOSPITAL_COMMUNITY)
Admission: AD | Admit: 2011-12-20 | Discharge: 2011-12-23 | DRG: 292 | Disposition: A | Discharge status: Home / Self Care | Payer: Medicare Other | Source: Ambulatory Visit | Attending: Internal Medicine | Admitting: Internal Medicine

## 2011-12-20 ENCOUNTER — Encounter: Payer: Self-pay | Admitting: Internal Medicine

## 2011-12-20 ENCOUNTER — Ambulatory Visit (HOSPITAL_BASED_OUTPATIENT_CLINIC_OR_DEPARTMENT_OTHER)
Admission: RE | Admit: 2011-12-20 | Discharge: 2011-12-20 | Disposition: A | Discharge status: Home / Self Care | Payer: Medicare Other | Source: Ambulatory Visit | Attending: Internal Medicine | Admitting: Internal Medicine

## 2011-12-20 ENCOUNTER — Encounter (HOSPITAL_COMMUNITY): Payer: Self-pay | Admitting: *Deleted

## 2011-12-20 VITALS — BP 100/72 | HR 92 | Wt 154.5 lb

## 2011-12-20 DIAGNOSIS — J449 Chronic obstructive pulmonary disease, unspecified: Secondary | ICD-10-CM | POA: Diagnosis present

## 2011-12-20 DIAGNOSIS — Z794 Long term (current) use of insulin: Secondary | ICD-10-CM

## 2011-12-20 DIAGNOSIS — Z86718 Personal history of other venous thrombosis and embolism: Secondary | ICD-10-CM

## 2011-12-20 DIAGNOSIS — Z9119 Patient's noncompliance with other medical treatment and regimen: Secondary | ICD-10-CM

## 2011-12-20 DIAGNOSIS — I2510 Atherosclerotic heart disease of native coronary artery without angina pectoris: Secondary | ICD-10-CM | POA: Diagnosis present

## 2011-12-20 DIAGNOSIS — N189 Chronic kidney disease, unspecified: Secondary | ICD-10-CM

## 2011-12-20 DIAGNOSIS — Z79899 Other long term (current) drug therapy: Secondary | ICD-10-CM

## 2011-12-20 DIAGNOSIS — Z9861 Coronary angioplasty status: Secondary | ICD-10-CM

## 2011-12-20 DIAGNOSIS — Y92009 Unspecified place in unspecified non-institutional (private) residence as the place of occurrence of the external cause: Secondary | ICD-10-CM

## 2011-12-20 DIAGNOSIS — E119 Type 2 diabetes mellitus without complications: Secondary | ICD-10-CM | POA: Diagnosis present

## 2011-12-20 DIAGNOSIS — Z91199 Patient's noncompliance with other medical treatment and regimen due to unspecified reason: Secondary | ICD-10-CM

## 2011-12-20 DIAGNOSIS — E785 Hyperlipidemia, unspecified: Secondary | ICD-10-CM | POA: Diagnosis present

## 2011-12-20 DIAGNOSIS — K219 Gastro-esophageal reflux disease without esophagitis: Secondary | ICD-10-CM | POA: Diagnosis present

## 2011-12-20 DIAGNOSIS — Y831 Surgical operation with implant of artificial internal device as the cause of abnormal reaction of the patient, or of later complication, without mention of misadventure at the time of the procedure: Secondary | ICD-10-CM | POA: Diagnosis present

## 2011-12-20 DIAGNOSIS — F17200 Nicotine dependence, unspecified, uncomplicated: Secondary | ICD-10-CM | POA: Diagnosis present

## 2011-12-20 DIAGNOSIS — J4489 Other specified chronic obstructive pulmonary disease: Secondary | ICD-10-CM | POA: Diagnosis present

## 2011-12-20 DIAGNOSIS — I5023 Acute on chronic systolic (congestive) heart failure: Secondary | ICD-10-CM

## 2011-12-20 DIAGNOSIS — E876 Hypokalemia: Secondary | ICD-10-CM | POA: Diagnosis present

## 2011-12-20 DIAGNOSIS — I059 Rheumatic mitral valve disease, unspecified: Secondary | ICD-10-CM

## 2011-12-20 DIAGNOSIS — I472 Ventricular tachycardia, unspecified: Secondary | ICD-10-CM | POA: Diagnosis present

## 2011-12-20 DIAGNOSIS — Z7982 Long term (current) use of aspirin: Secondary | ICD-10-CM

## 2011-12-20 DIAGNOSIS — Z95810 Presence of automatic (implantable) cardiac defibrillator: Secondary | ICD-10-CM

## 2011-12-20 DIAGNOSIS — I959 Hypotension, unspecified: Secondary | ICD-10-CM | POA: Diagnosis present

## 2011-12-20 DIAGNOSIS — Z8249 Family history of ischemic heart disease and other diseases of the circulatory system: Secondary | ICD-10-CM

## 2011-12-20 DIAGNOSIS — Z91040 Latex allergy status: Secondary | ICD-10-CM

## 2011-12-20 DIAGNOSIS — I4729 Other ventricular tachycardia: Secondary | ICD-10-CM | POA: Diagnosis present

## 2011-12-20 DIAGNOSIS — T82897A Other specified complication of cardiac prosthetic devices, implants and grafts, initial encounter: Secondary | ICD-10-CM | POA: Diagnosis present

## 2011-12-20 DIAGNOSIS — Z888 Allergy status to other drugs, medicaments and biological substances status: Secondary | ICD-10-CM

## 2011-12-20 DIAGNOSIS — Z23 Encounter for immunization: Secondary | ICD-10-CM

## 2011-12-20 DIAGNOSIS — I2589 Other forms of chronic ischemic heart disease: Secondary | ICD-10-CM | POA: Diagnosis present

## 2011-12-20 DIAGNOSIS — F411 Generalized anxiety disorder: Secondary | ICD-10-CM | POA: Diagnosis present

## 2011-12-20 DIAGNOSIS — I252 Old myocardial infarction: Secondary | ICD-10-CM

## 2011-12-20 DIAGNOSIS — I509 Heart failure, unspecified: Secondary | ICD-10-CM | POA: Diagnosis present

## 2011-12-20 DIAGNOSIS — F1010 Alcohol abuse, uncomplicated: Secondary | ICD-10-CM | POA: Diagnosis present

## 2011-12-20 DIAGNOSIS — G4730 Sleep apnea, unspecified: Secondary | ICD-10-CM | POA: Diagnosis present

## 2011-12-20 LAB — COMPREHENSIVE METABOLIC PANEL
ALT: 11 U/L (ref 0–35)
AST: 17 U/L (ref 0–37)
Albumin: 3.2 g/dL — ABNORMAL LOW (ref 3.5–5.2)
Alkaline Phosphatase: 128 U/L — ABNORMAL HIGH (ref 39–117)
BUN: 15 mg/dL (ref 6–23)
CO2: 27 mEq/L (ref 19–32)
Calcium: 9.3 mg/dL (ref 8.4–10.5)
Chloride: 93 mEq/L — ABNORMAL LOW (ref 96–112)
Creatinine, Ser: 1.06 mg/dL (ref 0.50–1.10)
GFR calc Af Amer: 68 mL/min — ABNORMAL LOW (ref >90)
GFR calc non Af Amer: 59 mL/min — ABNORMAL LOW (ref >90)
Glucose, Bld: 374 mg/dL — ABNORMAL HIGH (ref 70–99)
Potassium: 3.6 mEq/L (ref 3.5–5.1)
Sodium: 134 mEq/L — ABNORMAL LOW (ref 135–145)
Total Bilirubin: 1 mg/dL (ref 0.3–1.2)
Total Protein: 7.6 g/dL (ref 6.0–8.3)

## 2011-12-20 LAB — GLUCOSE, CAPILLARY
Glucose-Capillary: 376 mg/dL — ABNORMAL HIGH (ref 70–99)
Glucose-Capillary: 379 mg/dL — ABNORMAL HIGH (ref 70–99)

## 2011-12-20 LAB — CBC WITH DIFFERENTIAL/PLATELET
Basophils Absolute: 0 10*3/uL (ref 0.0–0.1)
Basophils Relative: 1 % (ref 0–1)
HCT: 36.1 % (ref 36.0–46.0)
Hemoglobin: 12.3 g/dL (ref 12.0–15.0)
Lymphocytes Relative: 17 % (ref 12–46)
MCHC: 34.1 g/dL (ref 30.0–36.0)
Monocytes Absolute: 0.4 10*3/uL (ref 0.1–1.0)
Neutro Abs: 4.2 10*3/uL (ref 1.7–7.7)
Neutrophils Relative %: 74 % (ref 43–77)
RDW: 16 % — ABNORMAL HIGH (ref 11.5–15.5)
WBC: 5.6 10*3/uL (ref 4.0–10.5)

## 2011-12-20 LAB — DIGOXIN LEVEL: Digoxin Level: <0.3 ng/mL — ABNORMAL LOW (ref 0.8–2.0)

## 2011-12-20 LAB — MAGNESIUM: Magnesium: 1.4 mg/dL — ABNORMAL LOW (ref 1.5–2.5)

## 2011-12-20 LAB — MRSA PCR SCREENING: MRSA by PCR: NEGATIVE

## 2011-12-20 LAB — PRO B NATRIURETIC PEPTIDE: Pro B Natriuretic peptide (BNP): 3292 pg/mL — ABNORMAL HIGH (ref 0–125)

## 2011-12-20 MED ORDER — MILRINONE IN DEXTROSE 200-5 MCG/ML-% IV SOLN
0.1250 ug/kg/min | INTRAVENOUS | Status: DC
  Administered 2011-12-20 – 2011-12-21 (×2): 0.125 ug/kg/min via INTRAVENOUS
  Filled 2011-12-20 (×3): qty 100

## 2011-12-20 MED ORDER — INSULIN GLARGINE 100 UNIT/ML ~~LOC~~ SOLN
10.0000 [IU] | Freq: Every day | SUBCUTANEOUS | Status: DC
  Administered 2011-12-20: 10 [IU] via SUBCUTANEOUS

## 2011-12-20 MED ORDER — SERTRALINE HCL 50 MG PO TABS
50.0000 mg | ORAL_TABLET | Freq: Every day | ORAL | Status: DC
  Administered 2011-12-20 – 2011-12-23 (×4): 50 mg via ORAL
  Filled 2011-12-20 (×4): qty 1

## 2011-12-20 MED ORDER — INSULIN ASPART 100 UNIT/ML ~~LOC~~ SOLN: 0.0000 [IU] | Freq: Three times a day (TID) | SUBCUTANEOUS | Status: DC

## 2011-12-20 MED ORDER — DIGOXIN 125 MCG PO TABS
0.1250 mg | ORAL_TABLET | Freq: Every day | ORAL | Status: DC
  Administered 2011-12-20 – 2011-12-23 (×4): 0.125 mg via ORAL
  Filled 2011-12-20 (×4): qty 1

## 2011-12-20 MED ORDER — PANTOPRAZOLE SODIUM 40 MG PO TBEC
40.0000 mg | DELAYED_RELEASE_TABLET | Freq: Every day | ORAL | Status: DC
  Administered 2011-12-21 – 2011-12-22 (×2): 40 mg via ORAL
  Filled 2011-12-20 (×2): qty 1

## 2011-12-20 MED ORDER — OXYCODONE-ACETAMINOPHEN 5-325 MG PO TABS
1.0000 | ORAL_TABLET | Freq: Four times a day (QID) | ORAL | Status: DC | PRN
  Administered 2011-12-20 – 2011-12-22 (×2): 1 via ORAL
  Administered 2011-12-22 (×2): 2 via ORAL
  Filled 2011-12-20: qty 2
  Filled 2011-12-20 (×2): qty 1
  Filled 2011-12-20: qty 2

## 2011-12-20 MED ORDER — SPIRONOLACTONE 25 MG PO TABS
25.0000 mg | ORAL_TABLET | Freq: Every day | ORAL | Status: DC
  Administered 2011-12-20 – 2011-12-23 (×4): 25 mg via ORAL
  Filled 2011-12-20 (×4): qty 1

## 2011-12-20 MED ORDER — SODIUM CHLORIDE 0.9 % IV SOLN
250.0000 mL | INTRAVENOUS | Status: DC | PRN
  Administered 2011-12-20: 18 mL via INTRAVENOUS
  Administered 2011-12-21: 18 mL/h via INTRAVENOUS

## 2011-12-20 MED ORDER — SODIUM CHLORIDE 0.9 % IJ SOLN
10.0000 mL | Freq: Two times a day (BID) | INTRAMUSCULAR | Status: DC
  Administered 2011-12-22: 10 mL

## 2011-12-20 MED ORDER — INSULIN ASPART 100 UNIT/ML ~~LOC~~ SOLN
0.0000 [IU] | Freq: Three times a day (TID) | SUBCUTANEOUS | Status: DC
  Administered 2011-12-20: 15 [IU] via SUBCUTANEOUS
  Administered 2011-12-21 (×2): 3 [IU] via SUBCUTANEOUS
  Administered 2011-12-21: 8 [IU] via SUBCUTANEOUS
  Administered 2011-12-22 (×2): 3 [IU] via SUBCUTANEOUS
  Administered 2011-12-22: 11 [IU] via SUBCUTANEOUS
  Administered 2011-12-23: 2 [IU] via SUBCUTANEOUS

## 2011-12-20 MED ORDER — INSULIN GLARGINE 100 UNIT/ML ~~LOC~~ SOLN
15.0000 [IU] | Freq: Every day | SUBCUTANEOUS | Status: DC
  Administered 2011-12-21 – 2011-12-23 (×3): 15 [IU] via SUBCUTANEOUS

## 2011-12-20 MED ORDER — POTASSIUM CHLORIDE CRYS ER 20 MEQ PO TBCR
40.0000 meq | EXTENDED_RELEASE_TABLET | Freq: Once | ORAL | Status: AC
  Administered 2011-12-20: 40 meq via ORAL
  Filled 2011-12-20: qty 2

## 2011-12-20 MED ORDER — SODIUM CHLORIDE 0.9 % IJ SOLN: 3.0000 mL | INTRAMUSCULAR | Status: DC | PRN

## 2011-12-20 MED ORDER — ACETAMINOPHEN 325 MG PO TABS
650.0000 mg | ORAL_TABLET | ORAL | Status: DC | PRN
  Filled 2011-12-20 (×2): qty 2

## 2011-12-20 MED ORDER — SODIUM CHLORIDE 0.9 % IJ SOLN
3.0000 mL | Freq: Two times a day (BID) | INTRAMUSCULAR | Status: DC
  Administered 2011-12-23: 3 mL via INTRAVENOUS

## 2011-12-20 MED ORDER — SODIUM CHLORIDE 0.9 % IJ SOLN: 10.0000 mL | INTRAMUSCULAR | Status: DC | PRN

## 2011-12-20 MED ORDER — ENOXAPARIN SODIUM 30 MG/0.3ML ~~LOC~~ SOLN
30.0000 mg | Freq: Every day | SUBCUTANEOUS | Status: DC
  Filled 2011-12-20: qty 0.3

## 2011-12-20 MED ORDER — MAGNESIUM OXIDE 400 (241.3 MG) MG PO TABS
400.0000 mg | ORAL_TABLET | Freq: Every day | ORAL | Status: DC
  Administered 2011-12-20 – 2011-12-23 (×4): 400 mg via ORAL
  Filled 2011-12-20 (×4): qty 1

## 2011-12-20 MED ORDER — FUROSEMIDE 10 MG/ML IJ SOLN
80.0000 mg | Freq: Two times a day (BID) | INTRAMUSCULAR | Status: DC
  Administered 2011-12-20: 80 mg via INTRAVENOUS
  Filled 2011-12-20 (×3): qty 8

## 2011-12-20 MED ORDER — ENOXAPARIN SODIUM 40 MG/0.4ML ~~LOC~~ SOLN
40.0000 mg | Freq: Every day | SUBCUTANEOUS | Status: DC
  Administered 2011-12-21 – 2011-12-23 (×3): 40 mg via SUBCUTANEOUS
  Filled 2011-12-20 (×3): qty 0.4

## 2011-12-20 MED ORDER — ASPIRIN EC 81 MG PO TBEC
81.0000 mg | DELAYED_RELEASE_TABLET | Freq: Every day | ORAL | Status: DC
  Administered 2011-12-20 – 2011-12-23 (×4): 81 mg via ORAL
  Filled 2011-12-20 (×4): qty 1

## 2011-12-20 MED ORDER — PNEUMOCOCCAL VAC POLYVALENT 25 MCG/0.5ML IJ INJ
0.5000 mL | INJECTION | INTRAMUSCULAR | Status: AC
  Filled 2011-12-20: qty 0.5

## 2011-12-20 MED ORDER — POTASSIUM CHLORIDE CRYS ER 20 MEQ PO TBCR
20.0000 meq | EXTENDED_RELEASE_TABLET | Freq: Every day | ORAL | Status: DC
  Administered 2011-12-20: 20 meq via ORAL
  Filled 2011-12-20 (×2): qty 1

## 2011-12-20 MED ORDER — ONDANSETRON HCL 4 MG/2ML IJ SOLN: 4.0000 mg | Freq: Four times a day (QID) | INTRAMUSCULAR | Status: DC | PRN

## 2011-12-20 MED ORDER — MAGNESIUM SULFATE 40 MG/ML IJ SOLN
4.0000 g | Freq: Once | INTRAMUSCULAR | Status: AC
  Administered 2011-12-20: 4 g via INTRAVENOUS
  Filled 2011-12-20: qty 100

## 2011-12-20 NOTE — Progress Notes (Signed)
Patient ID: Jessica [name], female   DOB: 1960-07-09, 51 y.o.   MRN: [id number] PCP: Dr Dierdre Searles  HPI: [name] is a 51 y.o. female with a history of severe congestive heart failure, secondary ischemic cardiomyopathy with an ejection fraction of 25-30%. She also has a history of coronary artery disease, status post multiple interventions, ICD implantation, diabetes, hyperlipidemia, history of ongoing tobacco and possibly alcohol use.  Status post right iliofemoral embolectomy and compartment fasciotomy of the right leg by Dr. Darrick Penna with LLE DVT 06/2010.    She has a history of noncompliance.  She has inolerance of ACE-I due to severe cough.  Problem tolerating b-blocker and ARB due to low BP.    Cath 02/2011: showed severe 3 vessel CAD with occlusion of multiple stents, EF 10-15%  02/2011 R heart Cath: RA = 18, RV = 67/10/20, PA = 56/30 (41), PCW = 23, Ao = 97/72 (83), LV = 93/20/30, Fick cardiac output/index = 2.8/1.7, PVR = 6.5 Woods  SVR = 1878, FA sat = 88%, PA sat = 42%, 46%,   ECHO 02/22/11: Only basal function preserved. EF 20-25%.   Not a candidate for CABG. Discussion with patient about LVAD as an option, however patient declined.  Medical therapy continued.  Coumadin stopped for DVT due to noncompliance with 6 months of completed therapy.     She returns for 1 week follow up.   Given IV lasix 80 mg in clinic last week and meds replaced.  Has been complaint with meds but remains 11 pounds up.  +orthopnea/PND.  +abdominal distention.  +cough.  +fatigue.  Had shock 2 days ago for VT 270 bpm requiring one appropriate shock with return to SR  ROS: All systems negative except as listed in HPI, PMH and Problem List.  Past Medical History  Diagnosis Date  . Chronic systolic heart failure     a.  secondary to severe ischemic cardiomyopathy with EF 20-25%;  b. s/p rimplantation of Biotronik single chamber ICD by Dr Ladona Ridgel 02/2008;  c.  Right heart cath 04/2007: RA 18, PA 56/30 (40), PCWP 36. FICK  3.89/2.06 Woods;  d. echo 9/09: Ef 20-25%, mild MR, mild LAE  . CAD (coronary artery disease)     a. s/p multiple PCIs; b. cath 10/07: LAD stent 40%, dx 80% ISR - tx with POBA; OM stent ok; ostial OM 60%; RCA occluded with L-R collats  . Tobacco abuse   . Alcohol abuse   . History of noncompliance with medical treatment   . Diabetes mellitus, type 2   . Dizziness   . HLD (hyperlipidemia)   . DVT (deep venous thrombosis)     coumadin  . Arterial embolism     s/p right iliofemoral embolectomy with fasciotomy  . V-tach   . AICD (automatic cardioverter/defibrillator) present   . Angina   . Asthma   . Shortness of breath   . Sleep apnea   . CHF (congestive heart failure)   . GERD (gastroesophageal reflux disease)   . Anxiety   . Myocardial infarction   . COPD (chronic obstructive pulmonary disease)     Current Outpatient Prescriptions  Medication Sig Dispense Refill  . albuterol (PROVENTIL HFA;VENTOLIN HFA) 108 (90 BASE) MCG/ACT inhaler Inhale 2 puffs into the lungs every 6 (six) hours as needed. For shortness of breath      . albuterol (PROVENTIL) (2.5 MG/3ML) 0.083% nebulizer solution Take 2.5 mg by nebulization every 4 (four) hours as needed. For shortness of breath      .  aspirin 81 MG EC tablet Take 81 mg by mouth daily.      . bd getting started take home kit MISC 1 kit by Other route once.  1 kit  0  . digoxin (LANOXIN) 0.125 MG tablet Take 125 mcg by mouth daily.      . furosemide (LASIX) 40 MG tablet Take 2 tablets (80 mg total) by mouth daily.  60 tablet  3  . insulin glargine (LANTUS) 100 UNIT/ML injection Inject 10 Units into the skin daily.  10 mL  1  . Insulin Syringe-Needle U-100 (INSULIN SYRINGE .3CC/31GX5/16") 31G X 5/16" 0.3 ML MISC 1 application by Does not apply route 4 (four) times daily.  120 each  2  . lovastatin (MEVACOR) 40 MG tablet Take 40 mg by mouth at bedtime.      . magnesium oxide (MAG-OX) 400 MG tablet Take 400 mg by mouth daily.        . metolazone  (ZAROXOLYN) 2.5 MG tablet Take 1 tab every Monday and Friday  30 tablet  1  . metoprolol succinate (TOPROL XL) 25 MG 24 hr tablet Take 1 tablet (25 mg total) by mouth at bedtime.  30 tablet  1  . nitroGLYCERIN (NITROSTAT) 0.4 MG SL tablet Place 0.4 mg under the tongue every 5 (five) minutes as needed. For chest pain      . pantoprazole (PROTONIX) 40 MG tablet Take 40 mg by mouth daily.      . potassium chloride SA (K-DUR,KLOR-CON) 20 MEQ tablet Take 20 mEq by mouth daily.        . sertraline (ZOLOFT) 50 MG tablet Take 50 mg by mouth daily.       [name] spironolactone (ALDACTONE) 25 MG tablet Take 25 mg by mouth daily.         PHYSICAL EXAM: Filed Vitals:   12/20/11 1038  BP: 100/72  Pulse: 92  Weight: 154 lb 8 oz (70.081 kg)  SpO2: 91%   General: Chronically ill appearing.  No resp difficulty HEENT:  normal Neck: supple. JVP to ear.  Carotids 2+ bilaterally; no bruits. No lymphadenopathy or thryomegaly appreciated. Cor: PMI laterally displaced. Regular rate & rhythm. soft  S3  Lungs: Clear Abdomen: soft, nontender,  distended.  No hepatosplenomegaly. No bruits or masses. Good bowel sounds. Extremities: no cyanosis, clubbing, rash, RLE/ LLE trace edema Neuro: alert & orientedx3, cranial nerves grossly intact. Moves all 4 extremities w/o difficulty. Affect pleasant. Skin: Excoriations noted on her back with 3 partial thickness wounds noted 2x2 cm  ASSESSMENT & PLAN:

## 2011-12-20 NOTE — H&P (Signed)
Advanced Heart Failure Team History and Physical Note   Primary Physician: Dr. Dierdre Searles Primary Cardiologist: Dr. Gala Romney  Reason for Admission: ADHF  Baseline proBNP: 2407 on 09/27/11 Weight Range: 143 pounds  HPI:   Jessica [name] is a 51 y.o. female with a history of severe systolic heart failure, secondary ischemic cardiomyopathy with an ejection fraction of 20-25%. She also has a history of coronary artery disease, status post multiple interventions, ICD implantation, diabetes, hyperlipidemia, history of ongoing tobacco and possibly alcohol use. Status post right iliofemoral embolectomy and compartment fasciotomy of the right leg by Dr. Darrick Penna with LLE DVT 06/2010. Coumadin was discontinued due to noncompliance but she did receive 6 months of therapy. She has a history of noncompliance. She has inolerance of ACE-I due to severe cough. Problem tolerating b-blocker and ARB due to low BP.   Last cardiac cath in 02/2011 showed severe 3 vessel CAD with occlusion of multiple stents, EF 10-15%. She was evaluated by Dr. Maren Beach for CABG but felt not to be a candidate. Right heart cath at the time showed RA = 18, RV = 67/10/20, PA = 56/30 (41), PCW = 23, Ao = 97/72 (83), LV = 93/20/30, Fick cardiac output/index = 2.8/1.7, PVR = 6.5 Woods with SVR = 1878, FA sat = 88%, PA sat = 42%, 46%.  Not a candidate for CABG.  There was discussion with the patient concerning LVAD as an option, however patient declined. Medical therapy continued.    She has been having progressive dyspnea and edema in setting of med noncompliance.  Last week given IV lasix 80 mg in clinic with 800 cc out.  She was restarted on her meds and her weight has remained unchanged.  We saw her in clinic today. She continues to feel fatigued.  +orthopnea/PND and abdominal distention.  Weight remains 149 pounds at home (baseline about 138 pounds).  + cough.  UOP down.  She had a shock 2 days ago.  Device interrogated today with VT 270 bpm receiving 1  appropriate shock with return to SR.   With her weight being unchanged, UOP down and recent VT we admitted for treatment of her HF.   Review of Systems: [y] = yes, [ ]  = no   General: Weight gain Cove.Etienne ]; Weight loss [ ] ; Anorexia [ ] ; Fatigue [ ] ; Fever [ ] ; Chills [ ] ; Weakness [ ]   Cardiac: Chest pain/pressure [ ] ; Resting SOB [ ] ; Exertional SOB [ y]; Orthopnea Cove.Etienne ]; Pedal Edema [ ] ; Palpitations [ ] ; Syncope [ ] ; Presyncope [ ] ; Paroxysmal nocturnal dyspnea[y ]  Pulmonary: Cough Patrice.Shin ]; Wheezing[ ] ; Hemoptysis[ ] ; Sputum [ ] ; Snoring [ ]   GI: Vomiting[ ] ; Dysphagia[ ] ; Melena[ ] ; Hematochezia [ ] ; Heartburn[ ] ; Abdominal pain [ ] ; Constipation [ ] ; Diarrhea [ ] ; BRBPR [ ]   GU: Hematuria[ ] ; Dysuria [ ] ; Nocturia[ ]   Vascular: Pain in legs with walking [ ] ; Pain in feet with lying flat [ ] ; Non-healing sores [ ] ; Stroke [ ] ; TIA [ ] ; Slurred speech [ ] ;  Neuro: Headaches[ ] ; Vertigo[ ] ; Seizures[ ] ; Paresthesias[ ] ;Blurred vision [ ] ; Diplopia [ ] ; Vision changes [ ]   Ortho/Skin: Arthritis [ ] ; Joint pain [ ] ; Muscle pain [ ] ; Joint swelling [ ] ; Back Pain [ ] ; Rash [ ]   Psych: Depression[ ] ; Anxiety[ ]   Heme: Bleeding problems [ ] ; Clotting disorders [ ] ; Anemia [ ]   Endocrine: Diabetes [ ] ; Thyroid dysfunction[ ]   Home Medications Prior to Admission  medications   Medication Sig Start Date End Date Taking? Authorizing Provider  albuterol (PROVENTIL HFA;VENTOLIN HFA) 108 (90 BASE) MCG/ACT inhaler Inhale 2 puffs into the lungs every 6 (six) hours as needed. For shortness of breath    Historical Provider, MD  albuterol (PROVENTIL) (2.5 MG/3ML) 0.083% nebulizer solution Take 2.5 mg by nebulization every 4 (four) hours as needed. For shortness of breath    Historical Provider, MD  aspirin 81 MG EC tablet Take 81 mg by mouth daily. 02/23/11 02/23/12  Hadassah Pais, PA  bd getting started take home kit MISC 1 kit by Other route once. 10/04/11   Dede Query, MD  digoxin (LANOXIN) 0.125 MG tablet Take 125  mcg by mouth daily. 02/16/11 02/16/12  Amy D Clegg, NP  furosemide (LASIX) 40 MG tablet Take 2 tablets (80 mg total) by mouth daily. 11/15/11 11/14/12  Bevelyn Buckles Tonie Elsey, MD  insulin glargine (LANTUS) 100 UNIT/ML injection Inject 10 Units into the skin daily. 10/04/11 10/03/12  Dede Query, MD  Insulin Syringe-Needle U-100 (INSULIN SYRINGE .3CC/31GX5/16") 31G X 5/16" 0.3 ML MISC 1 application by Does not apply route 4 (four) times daily. 10/04/11   Dede Query, MD  lovastatin (MEVACOR) 40 MG tablet Take 40 mg by mouth at bedtime. 01/30/11 01/30/12  Bevelyn Buckles Ivon Roedel, MD  magnesium oxide (MAG-OX) 400 MG tablet Take 400 mg by mouth daily.      Historical Provider, MD  metolazone (ZAROXOLYN) 2.5 MG tablet Take 1 tab every Monday and Friday 11/21/11   Hadassah Pais, PA  metoprolol succinate (TOPROL XL) 25 MG 24 hr tablet Take 1 tablet (25 mg total) by mouth at bedtime. 10/04/11 10/03/12  Dede Query, MD  nitroGLYCERIN (NITROSTAT) 0.4 MG SL tablet Place 0.4 mg under the tongue every 5 (five) minutes as needed. For chest pain 02/23/11 02/23/12  Hadassah Pais, PA  pantoprazole (PROTONIX) 40 MG tablet Take 40 mg by mouth daily.    Historical Provider, MD  potassium chloride SA (K-DUR,KLOR-CON) 20 MEQ tablet Take 20 mEq by mouth daily.      Historical Provider, MD  sertraline (ZOLOFT) 50 MG tablet Take 50 mg by mouth daily.  04/19/11 04/18/12  Hadassah Pais, PA  spironolactone (ALDACTONE) 25 MG tablet Take 25 mg by mouth daily.    Historical Provider, MD    Past Medical History: Past Medical History  Diagnosis Date  . Chronic systolic heart failure     a.  secondary to severe ischemic cardiomyopathy with EF 20-25%;  b. s/p rimplantation of Biotronik single chamber ICD by Dr Ladona Ridgel 02/2008;  c.  Right heart cath 04/2007: RA 18, PA 56/30 (40), PCWP 36. FICK 3.89/2.06 Woods;  d. echo 9/09: Ef 20-25%, mild MR, mild LAE  . CAD (coronary artery disease)     a. s/p multiple PCIs; b. cath 10/07: LAD stent 40%, dx 80% ISR - tx with POBA;  OM stent ok; ostial OM 60%; RCA occluded with L-R collats  . Tobacco abuse   . Alcohol abuse   . History of noncompliance with medical treatment   . Diabetes mellitus, type 2   . Dizziness   . HLD (hyperlipidemia)   . DVT (deep venous thrombosis)     coumadin  . Arterial embolism     s/p right iliofemoral embolectomy with fasciotomy  . V-tach   . AICD (automatic cardioverter/defibrillator) present   . Angina   . Asthma   . Shortness of breath   . Sleep apnea   .  CHF (congestive heart failure)   . GERD (gastroesophageal reflux disease)   . Anxiety   . Myocardial infarction   . COPD (chronic obstructive pulmonary disease)     Past Surgical History: Past Surgical History  Procedure Date  . Diagnostic laparoscopy   . Cardiac catheterization   . Insert / replace / remove pacemaker   . Tubal ligation     Family History: Family History  Problem Relation Age of Onset  . Coronary artery disease      FMILY HISTORY    Social History: History   Social History  . Marital Status: Divorced    Spouse Name: N/A    Number of Children: N/A  . Years of Education: N/A   Occupational History  . disabled    Social History Main Topics  . Smoking status: Current Some Day Smoker -- 0.2 packs/day for 21 years    Types: Cigarettes  . Smokeless tobacco: Never Used  . Alcohol Use: No  . Drug Use: No  . Sexually Active: No   Other Topics Concern  . None   Social History Narrative  . None  On disability.  Lives with boyfriend.   Allergies:  Allergies  Allergen Reactions  . Latex Rash  . Ramipril Cough    Objective:    Vital Signs:   Temp:  [97.9 F (36.6 C)-98.2 F (36.8 C)] 97.9 F (36.6 C) (10/10 1900) Pulse Rate:  [87-95] 90  (10/10 1900) BP: (85-110)/(53-84) 104/63 mmHg (10/10 1900) SpO2:  [91 %-100 %] 99 % (10/10 1900) Weight:  [69 kg (152 lb 1.9 oz)-70.081 kg (154 lb 8 oz)] 69 kg (152 lb 1.9 oz) (10/10 1600) Last BM Date: 12/20/11 (PTA) Filed Weights    12/20/11 1500 12/20/11 1600  Weight: 70.081 kg (154 lb 8 oz) 69 kg (152 lb 1.9 oz)    Physical Exam: General: Chronically ill appearing. No resp difficulty  HEENT: normal  Neck: supple. JVP to ear. Carotids 2+ bilaterally; no bruits. No lymphadenopathy or thryomegaly appreciated.  Cor: PMI laterally displaced. Regular rate & rhythm. s+ S3  Lungs: Clear  Abdomen: soft, nontender. Mildly distended. No hepatosplenomegaly. No bruits or masses. Good bowel sounds.  Extremities: no cyanosis, clubbing, rash, RLE/ LLE trace edema  Neuro: alert & orientedx3, cranial nerves grossly intact. Moves all 4 extremities w/o difficulty. Affect pleasant.  Skin: Excoriations noted on her back with 3 partial thickness wounds noted 2x2 cm   Results for orders placed during the hospital encounter of 12/20/11 (from the past 24 hour(s))  MRSA PCR SCREENING     Status: Normal   Collection Time   12/20/11  3:24 PM      Component Value Range   MRSA by PCR NEGATIVE  NEGATIVE  GLUCOSE, CAPILLARY     Status: Abnormal   Collection Time   12/20/11  3:43 PM      Component Value Range   Glucose-Capillary 376 (*) 70 - 99 mg/dL  COMPREHENSIVE METABOLIC PANEL     Status: Abnormal   Collection Time   12/20/11  4:05 PM      Component Value Range   Sodium 134 (*) 135 - 145 mEq/L   Potassium 3.6  3.5 - 5.1 mEq/L   Chloride 93 (*) 96 - 112 mEq/L   CO2 27  19 - 32 mEq/L   Glucose, Bld 374 (*) 70 - 99 mg/dL   BUN 15  6 - 23 mg/dL   Creatinine, Ser 2.13  0.50 - 1.10 mg/dL  Calcium 9.3  8.4 - 10.5 mg/dL   Total Protein 7.6  6.0 - 8.3 g/dL   Albumin 3.2 (*) 3.5 - 5.2 g/dL   AST 17  0 - 37 U/L   ALT 11  0 - 35 U/L   Alkaline Phosphatase 128 (*) 39 - 117 U/L   Total Bilirubin 1.0  0.3 - 1.2 mg/dL   GFR calc non Af Amer 59 (*) >90 mL/min   GFR calc Af Amer 68 (*) >90 mL/min  MAGNESIUM     Status: Abnormal   Collection Time   12/20/11  4:05 PM      Component Value Range   Magnesium 1.4 (*) 1.5 - 2.5 mg/dL  DIGOXIN  LEVEL     Status: Abnormal   Collection Time   12/20/11  4:05 PM      Component Value Range   Digoxin Level <0.3 (*) 0.8 - 2.0 ng/mL  CBC WITH DIFFERENTIAL     Status: Abnormal   Collection Time   12/20/11  4:05 PM      Component Value Range   WBC 5.6  4.0 - 10.5 K/uL   RBC 4.43  3.87 - 5.11 MIL/uL   Hemoglobin 12.3  12.0 - 15.0 g/dL   HCT 16.1  09.6 - 04.5 %   MCV 81.5  78.0 - 100.0 fL   MCH 27.8  26.0 - 34.0 pg   MCHC 34.1  30.0 - 36.0 g/dL   RDW 40.9 (*) 81.1 - 91.4 %   Platelets 232  150 - 400 K/uL   Neutrophils Relative 74  43 - 77 %   Neutro Abs 4.2  1.7 - 7.7 K/uL   Lymphocytes Relative 17  12 - 46 %   Lymphs Abs 1.0  0.7 - 4.0 K/uL   Monocytes Relative 7  3 - 12 %   Monocytes Absolute 0.4  0.1 - 1.0 K/uL   Eosinophils Relative 1  0 - 5 %   Eosinophils Absolute 0.0  0.0 - 0.7 K/uL   Basophils Relative 1  0 - 1 %   Basophils Absolute 0.0  0.0 - 0.1 K/uL  PRO B NATRIURETIC PEPTIDE     Status: Abnormal   Collection Time   12/20/11  4:05 PM      Component Value Range   Pro B Natriuretic peptide (BNP) 3292.0 (*) 0 - 125 pg/mL     Assessment:   1. Acute on chronic systolic HF with probable low output 2. ICM, EF 20% 3. VT s/p ICD shock on 10/8 4. 3V CAD with multiple stent occlusions     - not CABG candidate 5. DM2, poorly controlled 6. Tobacco abuse,ongoing 7. Hypokalemia 8. Hypomagnesemia  Plan/Discussion:    Allea presents with decompensated HF and has failed aggressive outpatient thereape and failed outpatient therapy.  Will admit for IV lasix with inotrope support.  Place PICC to follow Co-ox and CVPs.  With VT will monitor closely.  Suplpement K and Mag aggressively.  May consider getting palliative care on board to discuss goals of care at some point. Not candidate for advanced therapies.   Watch sugars. Cover with lantus and SSI.  Truman Hayward 7:53 PM

## 2011-12-20 NOTE — Progress Notes (Signed)
Peripherally Inserted Central Catheter/Midline Placement  The IV Nurse has discussed with the patient and/or persons authorized to consent for the patient, the purpose of this procedure and the potential benefits and risks involved with this procedure.  The benefits include less needle sticks, lab draws from the catheter and patient may be discharged home with the catheter.  Risks include, but not limited to, infection, bleeding, blood clot (thrombus formation), and puncture of an artery; nerve damage and irregular heat beat.  Alternatives to this procedure were also discussed.  PICC/Midline Placement Documentation        Jessica [name] 12/20/2011, 5:53 PM

## 2011-12-20 NOTE — Assessment & Plan Note (Signed)
Volume status remains elevated despite IV lasix in the clinic as well as outpatient therapy.  Will admit for IV diuresis in the setting of probable low output heart failure will also use low dose milrinone.  Will monitor for VT closely with recent shock but suspect this may be in setting of hypokalemia as she has had an issue with this in the past.  See H&P.

## 2011-12-20 NOTE — Assessment & Plan Note (Signed)
S/p 1 appropriate shock for VT.  Will check labs as probably due to hypokalemia.  Will follow closely.

## 2011-12-21 DIAGNOSIS — N179 Acute kidney failure, unspecified: Secondary | ICD-10-CM

## 2011-12-21 DIAGNOSIS — N189 Chronic kidney disease, unspecified: Secondary | ICD-10-CM

## 2011-12-21 LAB — IRON AND TIBC: TIBC: 331 ug/dL (ref 250–470)

## 2011-12-21 LAB — BASIC METABOLIC PANEL
Calcium: 9.3 mg/dL (ref 8.4–10.5)
GFR calc Af Amer: 72 mL/min — ABNORMAL LOW (ref >90)
GFR calc non Af Amer: 62 mL/min — ABNORMAL LOW (ref >90)
Potassium: 3.5 mEq/L (ref 3.5–5.1)
Sodium: 137 mEq/L (ref 135–145)

## 2011-12-21 LAB — CARBOXYHEMOGLOBIN
Carboxyhemoglobin: 2.3 % — ABNORMAL HIGH (ref 0.5–1.5)
O2 Saturation: 75 %
Total hemoglobin: 12 g/dL (ref 12.0–16.0)

## 2011-12-21 LAB — RETICULOCYTES
RBC.: 4.41 MIL/uL (ref 3.87–5.11)
Retic Count, Absolute: 101.4 10*3/uL (ref 19.0–186.0)
Retic Ct Pct: 2.3 % (ref 0.4–3.1)

## 2011-12-21 LAB — GLUCOSE, CAPILLARY
Glucose-Capillary: 280 mg/dL — ABNORMAL HIGH (ref 70–99)
Glucose-Capillary: 154 mg/dL — ABNORMAL HIGH (ref 70–99)

## 2011-12-21 LAB — VITAMIN B12: Vitamin B-12: 432 pg/mL (ref 211–911)

## 2011-12-21 MED ORDER — FUROSEMIDE 10 MG/ML IJ SOLN
120.0000 mg | Freq: Two times a day (BID) | INTRAVENOUS | Status: DC
  Administered 2011-12-21 – 2011-12-22 (×3): 120 mg via INTRAVENOUS
  Filled 2011-12-21 (×4): qty 12

## 2011-12-21 MED ORDER — METOLAZONE 5 MG PO TABS
5.0000 mg | ORAL_TABLET | Freq: Every day | ORAL | Status: DC
  Administered 2011-12-21: 5 mg via ORAL
  Filled 2011-12-21 (×2): qty 1

## 2011-12-21 MED ORDER — POTASSIUM CHLORIDE CRYS ER 20 MEQ PO TBCR
40.0000 meq | EXTENDED_RELEASE_TABLET | Freq: Two times a day (BID) | ORAL | Status: DC
  Administered 2011-12-21 (×2): 40 meq via ORAL
  Filled 2011-12-21 (×2): qty 2
  Filled 2011-12-21: qty 1
  Filled 2011-12-21: qty 2

## 2011-12-21 NOTE — Progress Notes (Signed)
Advanced Heart Failure Rounding Note   Subjective:    Elen is a 51 y.o. female with a history of severe systolic heart failure, secondary ischemic cardiomyopathy with an ejection fraction of 20-25%. She also has a history of coronary artery disease, status post multiple interventions, ICD implantation, diabetes, hyperlipidemia, history of ongoing tobacco and possibly alcohol use. Status post right iliofemoral embolectomy and compartment fasciotomy of the right leg by Dr. Darrick Penna with LLE DVT 06/2010. Coumadin was discontinued due to noncompliance but she did receive 6 months of therapy. She has a history of noncompliance. She has inolerance of ACE-I due to severe cough. Problem tolerating b-blocker and ARB due to low BP.   Last cardiac cath in 02/2011 showed severe 3 vessel CAD with occlusion of multiple stents, EF 10-15%. She was evaluated by Dr. Maren Beach for CABG but felt not to be a candidate. Right heart cath at the time showed RA = 18, RV = 67/10/20, PA = 56/30 (41), PCW = 23, Ao = 97/72 (83), LV = 93/20/30, Fick cardiac output/index = 2.8/1.7, PVR = 6.5 Woods with SVR = 1878, FA sat = 88%, PA sat = 42%, 46%. Not a candidate for CABG. There was discussion with the patient concerning LVAD as an option, however patient declined. Medical therapy continued.   She had a shock at home 12/18/11.  Device interrogated today with VT 270 bpm receiving 1 appropriate shock with return to SR.   Admitted from home after she was evaluated in HF clinic due to progressive dyspnea and weight gain. Admit Pro BNP started at 0.125 mcg via PICC.   Baseline Weight 143 pounds Weight 154 lbs> 152  Creatinine 1.06>1.01 CO-OX 75 on Milrinone 0.12 Fick Calculation CO 4.86 CI 2.75  Feels better. Denies SOB/PND/CP. + Orthopnea         Objective:   Weight Range:  Vital Signs:   Temp:  [97.8 F (36.6 C)-98.3 F (36.8 C)] 97.8 F (36.6 C) (10/11 0400) Pulse Rate:  [77-95] 87  (10/11 0300) Resp:   [18-26] 26  (10/11 0000) BP: (85-112)/(53-84) 102/65 mmHg (10/11 0700) SpO2:  [91 %-100 %] 98 % (10/11 0400) Weight:  [69 kg (152 lb 1.9 oz)-70.081 kg (154 lb 8 oz)] 69.355 kg (152 lb 14.4 oz) (10/11 0451) Last BM Date: 12/20/11  Weight change: Filed Weights   12/20/11 1500 12/20/11 1600 12/21/11 0451  Weight: 70.081 kg (154 lb 8 oz) 69 kg (152 lb 1.9 oz) 69.355 kg (152 lb 14.4 oz)    Intake/Output:   Intake/Output Summary (Last 24 hours) at 12/21/11 0818 Last data filed at 12/21/11 0700  Gross per 24 hour  Intake 323.72 ml  Output   1550 ml  Net -1226.28 ml     Physical Exam: CVP 15 General: Chronically ill appearing. No resp difficulty HEENT: normal Neck: supple. JVP to ear . Carotids 2+ bilat; no bruits. No lymphadenopathy or thryomegaly appreciated. Cor: PMI nondisplaced. Regular rate & rhythm. No rubs, or murmurs. S3 + Lungs: clear Abdomen: soft, nontender, distended. No hepatosplenomegaly. No bruits or masses. Good bowel sounds. Extremities: no cyanosis, clubbing, rash, edema Neuro: alert & orientedx3, cranial nerves grossly intact. moves all 4 extremities w/o difficulty. Affect pleasant  Telemetry: SR Labs: Basic Metabolic Panel:  Lab 12/21/11 9604 12/20/11 1605  NA 137 134*  K 3.5 3.6  CL 97 93*  CO2 29 27  GLUCOSE 134* 374*  BUN 17 15  CREATININE 1.01 1.06  CALCIUM 9.3 9.3  MG 2.3 1.4*  PHOS -- --    Liver Function Tests:  Lab 12/20/11 1605  AST 17  ALT 11  ALKPHOS 128*  BILITOT 1.0  PROT 7.6  ALBUMIN 3.2*   No results found for this basename: LIPASE:5,AMYLASE:5 in the last 168 hours No results found for this basename: AMMONIA:3 in the last 168 hours  CBC:  Lab 12/20/11 1605  WBC 5.6  NEUTROABS 4.2  HGB 12.3  HCT 36.1  MCV 81.5  PLT 232    Cardiac Enzymes: No results found for this basename: CKTOTAL:5,CKMB:5,CKMBINDEX:5,TROPONINI:5 in the last 168 hours  BNP: BNP (last 3 results)  Basename 12/20/11 1605 12/12/11 1055 09/27/11  1409  PROBNP 3292.0* 3083.0* 2407.0*     Other results:  EKG:   Imaging: No results found.   Medications:     Scheduled Medications:    . aspirin EC  81 mg Oral Daily  . digoxin  0.125 mg Oral Daily  . enoxaparin  40 mg Subcutaneous Daily  . furosemide  80 mg Intravenous BID  . insulin aspart  0-15 Units Subcutaneous TID WC  . insulin glargine  15 Units Subcutaneous Daily  . magnesium oxide  400 mg Oral Daily  . magnesium sulfate 1 - 4 g bolus IVPB  4 g Intravenous Once  . metolazone  5 mg Oral Daily  . pantoprazole  40 mg Oral Q1200  . pneumococcal 23 valent vaccine  0.5 mL Intramuscular Tomorrow-1000  . potassium chloride  40 mEq Oral Once  . potassium chloride  40 mEq Oral BID  . sertraline  50 mg Oral Daily  . sodium chloride  10-40 mL Intracatheter Q12H  . sodium chloride  3 mL Intravenous Q12H  . spironolactone  25 mg Oral Daily  . DISCONTD: enoxaparin  30 mg Subcutaneous Daily  . DISCONTD: insulin aspart  0-15 Units Subcutaneous TID WC  . DISCONTD: insulin glargine  10 Units Subcutaneous Daily  . DISCONTD: potassium chloride  20 mEq Oral Daily    Infusions:    . milrinone 0.125 mcg/kg/min (12/20/11 1911)    PRN Medications: sodium chloride, acetaminophen, ondansetron (ZOFRAN) IV, oxyCODONE-acetaminophen, sodium chloride, sodium chloride   Assessment:  1.Acute on chronic systolic HF with probable low output  2. ICM, EF 20%  3. VT s/p ICD shock on 10/8  4. 3V CAD with multiple stent occlusions  - not CABG candidate  5. DM2, poorly controlled  6. Tobacco abuse,ongoing  7. Hypokalemia  8. Hypomagnesemia     Plan/Discussion:    NYHA IIIB-IV. Volume status remains elevated. Only mild response to IV lasix. Co-ox is good. Continue Milrionone 0.125 mcg.  Increase IV lasix to 120 BID and add Metolazone 5 mg daily. Supplement potassium. Would like get at least 10 pounds off.   K and Mag supplemented. mag ok. Potassium still low. Will adjust.   Check  anemia panel.   Consult cardiac rehab.   Will transfer to stepdown. Keep in stepdown over weekend. Continue milrinone and IV diuresis. BP has been too low to titrate HF meds. CAD is non-revascularizable.  Length of Stay: 1 Akansha Wyche 12/21/2011, 8:18 AM

## 2011-12-21 NOTE — Care Management Note (Signed)
    Page 1 of 1   12/21/2011     10:13:06 AM   CARE MANAGEMENT NOTE 12/21/2011  Patient:  Jessica [name], Jessica [name]   Account Number:  [id number]  Date Initiated:  12/21/2011  Documentation initiated by:  Junius Creamer  Subjective/Objective Assessment:   adm w heart failure     Action/Plan:   lives w friend, hx of ahc   Anticipated DC Date:     Anticipated DC Plan:        DC Planning Services  CM consult      Choice offered to / List presented to:             Status of service:   Medicare Important Message given?   (If response is "NO", the following Medicare IM given date fields will be blank) Date Medicare IM given:   Date Additional Medicare IM given:    Discharge Disposition:    Per UR Regulation:  Reviewed for med. necessity/level of care/duration of stay  If discussed at Long Length of Stay Meetings, dates discussed:    Comments:  10/11 10:12 a debbie Wonda Goodgame rn,bsn [phone]

## 2011-12-21 NOTE — Progress Notes (Signed)
CARDIAC REHAB PHASE I   PRE:  Rate/Rhythm: 90SR PVCs  BP:  Supine: 91/55  Sitting:   Standing:    SaO2: 90%RA  MODE:  Ambulation: 350 ft   POST:  Rate/Rhythem: 105STPVCS  BP:  Supine:   Sitting: 104/79  Standing:    SaO2: 95%2L 1315-1355 Pt on RA when I entered room. Sats at 90%. Assisted to [facility] prior to walk. Ambulated 350 ft on 2L with rolling walker and asst x 1. Stopped once to rest. Denied CP or SOB. Did c/o generalized weakness and being tired at end of walk.Sats at 95% on 2L. Left on oxygen. Tolerated well.  Jessica [name]

## 2011-12-22 LAB — BASIC METABOLIC PANEL
Calcium: 9.4 mg/dL (ref 8.4–10.5)
Chloride: 92 mEq/L — ABNORMAL LOW (ref 96–112)
Creatinine, Ser: 1.11 mg/dL — ABNORMAL HIGH (ref 0.50–1.10)
GFR calc Af Amer: 65 mL/min — ABNORMAL LOW (ref >90)
GFR calc non Af Amer: 56 mL/min — ABNORMAL LOW (ref >90)

## 2011-12-22 LAB — GLUCOSE, CAPILLARY: Glucose-Capillary: 313 mg/dL — ABNORMAL HIGH (ref 70–99)

## 2011-12-22 LAB — CARBOXYHEMOGLOBIN: Total hemoglobin: 12.6 g/dL (ref 12.0–16.0)

## 2011-12-22 MED ORDER — METOLAZONE 2.5 MG PO TABS
2.5000 mg | ORAL_TABLET | Freq: Every day | ORAL | Status: DC
  Administered 2011-12-22 – 2011-12-23 (×2): 2.5 mg via ORAL
  Filled 2011-12-22 (×2): qty 1

## 2011-12-22 MED ORDER — POTASSIUM CHLORIDE CRYS ER 20 MEQ PO TBCR
20.0000 meq | EXTENDED_RELEASE_TABLET | Freq: Two times a day (BID) | ORAL | Status: DC
  Administered 2011-12-22 – 2011-12-23 (×3): 20 meq via ORAL
  Filled 2011-12-22 (×2): qty 1

## 2011-12-22 MED ORDER — FUROSEMIDE 80 MG PO TABS
80.0000 mg | ORAL_TABLET | Freq: Every day | ORAL | Status: DC
  Administered 2011-12-23: 80 mg via ORAL
  Filled 2011-12-22: qty 1

## 2011-12-22 NOTE — Progress Notes (Signed)
CARDIAC REHAB PHASE I   PRE:  Rate/Rhythm: 83 PVCs  BP:  Supine:   Sitting: 99/70  Standing:    SaO2: 95% RA  MODE:  Ambulation: 350 ft   POST:  Rate/Rhythm: 92 PVCs  BP:  Supine:   Sitting: 93/66  Standing:    SaO2: 92% RA  0836-0900 Pt tolerated ambulation well with assist x1 and pushing rolling walker. Gait steady, no c/o, VSS. To bed after walk.  Annetta Maw

## 2011-12-22 NOTE — Progress Notes (Signed)
Advanced Heart Failure Rounding Note   Subjective:    Cataleya is a 51 y.o. female with a history of severe systolic heart failure, secondary ischemic cardiomyopathy with an ejection fraction of 20-25%. She also has a history of coronary artery disease, status post multiple interventions, ICD implantation, diabetes, hyperlipidemia, history of ongoing tobacco and possibly alcohol use. Status post right iliofemoral embolectomy and compartment fasciotomy of the right leg by Dr. Darrick Penna with LLE DVT 06/2010. Coumadin was discontinued due to noncompliance but she did receive 6 months of therapy. She has a history of noncompliance. She has inolerance of ACE-I due to severe cough. Problem tolerating b-blocker and ARB due to low BP.   Last cardiac cath in 02/2011 showed severe 3 vessel CAD with occlusion of multiple stents, EF 10-15%. She was evaluated by Dr. Maren Beach for CABG but felt not to be a candidate. CABG. There was discussion with the patient concerning LVAD as an option, however patient declined. Medical therapy continued.   She had a shock at home 12/18/11.  Device interrogated with VT 270 bpm receiving 1 appropriate shock with return to SR.  Admitted from home after she was evaluated in HF clinic due to progressive dyspnea and weight gain. Admit Pro BNP started at 0.125 mcg via PICC. Admit weight 154 (baseline 143)  Lasix increased yesterday. Negative 3L but weight only down 2 pounds (150 today). Co-ox good 81%  Feels better. Denies SOB/PND/CP. Orthopnea resolved. Electrolytes much improved. No further VT.   CVP 7-8        Objective:   Weight Range:  Vital Signs:   Temp:  [98.1 F (36.7 C)-99 F (37.2 C)] 98.7 F (37.1 C) (10/12 0400) Resp:  [16-22] 16  (10/12 0000) BP: (85-110)/(60-83) 97/66 mmHg (10/12 0400) SpO2:  [95 %-99 %] 96 % (10/12 0400) Weight:  [68.04 kg (150 lb)] 68.04 kg (150 lb) (10/12 0500) Last BM Date: 12/20/11  Weight change: Filed Weights   12/20/11 1600 12/21/11 0451 12/22/11 0500  Weight: 69 kg (152 lb 1.9 oz) 69.355 kg (152 lb 14.4 oz) 68.04 kg (150 lb)    Intake/Output:   Intake/Output Summary (Last 24 hours) at 12/22/11 0815 Last data filed at 12/22/11 0400  Gross per 24 hour  Intake   1496 ml  Output   4800 ml  Net  -3304 ml     Physical Exam: CVP 15 General: Chronically ill appearing. No resp difficulty HEENT: normal Neck: supple. JVP to ear . Carotids 2+ bilat; no bruits. No lymphadenopathy or thryomegaly appreciated. Cor: PMI nondisplaced. Regular rate & rhythm. No rubs, or murmurs. S3 + Lungs: clear Abdomen: soft, nontender, distended. No hepatosplenomegaly. No bruits or masses. Good bowel sounds. Extremities: no cyanosis, clubbing, rash, edema Neuro: alert & orientedx3, cranial nerves grossly intact. moves all 4 extremities w/o difficulty. Affect pleasant  Telemetry: SR Labs: Basic Metabolic Panel:  Lab 12/22/11 1610 12/21/11 0450 12/20/11 1605  NA 133* 137 134*  K 4.4 3.5 3.6  CL 92* 97 93*  CO2 30 29 27   GLUCOSE 225* 134* 374*  BUN 21 17 15   CREATININE 1.11* 1.01 1.06  CALCIUM 9.4 9.3 9.3  MG -- 2.3 1.4*  PHOS -- -- --    Liver Function Tests:  Lab 12/20/11 1605  AST 17  ALT 11  ALKPHOS 128*  BILITOT 1.0  PROT 7.6  ALBUMIN 3.2*   No results found for this basename: LIPASE:5,AMYLASE:5 in the last 168 hours No results found for this basename: AMMONIA:3 in  the last 168 hours  CBC:  Lab 12/20/11 1605  WBC 5.6  NEUTROABS 4.2  HGB 12.3  HCT 36.1  MCV 81.5  PLT 232    Cardiac Enzymes: No results found for this basename: CKTOTAL:5,CKMB:5,CKMBINDEX:5,TROPONINI:5 in the last 168 hours  BNP: BNP (last 3 results)  Basename 12/20/11 1605 12/12/11 1055 09/27/11 1409  PROBNP 3292.0* 3083.0* 2407.0*     Other results:  EKG:   Imaging: No results found.   Medications:     Scheduled Medications:    . aspirin EC  81 mg Oral Daily  . digoxin  0.125 mg Oral Daily  .  enoxaparin  40 mg Subcutaneous Daily  . furosemide  120 mg Intravenous BID  . insulin aspart  0-15 Units Subcutaneous TID WC  . insulin glargine  15 Units Subcutaneous Daily  . magnesium oxide  400 mg Oral Daily  . metolazone  5 mg Oral Daily  . pantoprazole  40 mg Oral Q1200  . pneumococcal 23 valent vaccine  0.5 mL Intramuscular Tomorrow-1000  . potassium chloride  40 mEq Oral BID  . sertraline  50 mg Oral Daily  . sodium chloride  10-40 mL Intracatheter Q12H  . sodium chloride  3 mL Intravenous Q12H  . spironolactone  25 mg Oral Daily  . DISCONTD: furosemide  80 mg Intravenous BID    Infusions:    . milrinone 0.125 mcg/kg/min (12/21/11 2332)    PRN Medications: sodium chloride, acetaminophen, ondansetron (ZOFRAN) IV, oxyCODONE-acetaminophen, sodium chloride, sodium chloride   Assessment:  1.Acute on chronic systolic HF with probable low output  2. ICM, EF 20%  3. VT s/p ICD shock on 10/8  4. 3V CAD with multiple stent occlusions  - not CABG candidate  5. DM2, poorly controlled  6. Tobacco abuse,ongoing  7. Hypokalemia  8. Hypomagnesemia     Plan/Discussion:    Much improved. CVP down to 7-8. She is just about dry. Will give one more dose of IV lasix and then switch to po. Continue milrinone. Looks like dry weight has increased from 142 closer to 150.  BP has been too low to titrate HF meds. CAD is non-revascularizable.  Probably home tomorrow.  Length of Stay: 2 Arvilla Meres 12/22/2011, 8:15 AM

## 2011-12-23 LAB — CARBOXYHEMOGLOBIN
Carboxyhemoglobin: 1.8 % — ABNORMAL HIGH (ref 0.5–1.5)
Methemoglobin: 1.2 % (ref 0.0–1.5)
Total hemoglobin: 13.1 g/dL (ref 12.0–16.0)

## 2011-12-23 LAB — GLUCOSE, CAPILLARY: Glucose-Capillary: 169 mg/dL — ABNORMAL HIGH (ref 70–99)

## 2011-12-23 LAB — BASIC METABOLIC PANEL WITH GFR
BUN: 21 mg/dL (ref 6–23)
CO2: 29 meq/L (ref 19–32)
Calcium: 9.6 mg/dL (ref 8.4–10.5)
Chloride: 92 meq/L — ABNORMAL LOW (ref 96–112)
Creatinine, Ser: 0.96 mg/dL (ref 0.50–1.10)
GFR calc Af Amer: 77 mL/min — ABNORMAL LOW
GFR calc non Af Amer: 66 mL/min — ABNORMAL LOW
Glucose, Bld: 204 mg/dL — ABNORMAL HIGH (ref 70–99)
Potassium: 4 meq/L (ref 3.5–5.1)
Sodium: 131 meq/L — ABNORMAL LOW (ref 135–145)

## 2011-12-23 MED ORDER — FUROSEMIDE 10 MG/ML IJ SOLN
80.0000 mg | Freq: Once | INTRAMUSCULAR | Status: AC
  Administered 2011-12-23: 80 mg via INTRAVENOUS
  Filled 2011-12-23: qty 8

## 2011-12-23 MED ORDER — LOVASTATIN 20 MG PO TABS
40.0000 mg | ORAL_TABLET | Freq: Every day | ORAL | Status: DC
  Filled 2011-12-23: qty 2

## 2011-12-23 MED ORDER — METOPROLOL SUCCINATE ER 25 MG PO TB24
25.0000 mg | ORAL_TABLET | Freq: Every day | ORAL | Status: DC
  Filled 2011-12-23: qty 1

## 2011-12-23 MED ORDER — MAGNESIUM OXIDE 400 (241.3 MG) MG PO TABS: 400.0000 mg | ORAL_TABLET | Freq: Every day | ORAL | Status: DC

## 2011-12-23 MED ORDER — METFORMIN HCL 500 MG PO TABS: 500.0000 mg | ORAL_TABLET | Freq: Two times a day (BID) | ORAL | Status: DC

## 2011-12-23 MED ORDER — NON FORMULARY: 40.0000 mg | Freq: Every day | Status: DC

## 2011-12-23 MED ORDER — METOPROLOL SUCCINATE ER 25 MG PO TB24: 25.0000 mg | ORAL_TABLET | Freq: Every day | ORAL | Status: DC

## 2011-12-23 NOTE — Progress Notes (Signed)
PICC dc'd.  Patient given instructions via teach back method for care post removal, what to call MD with and what to do if bleeding occurs.  Understanding verbalized and demonstrated. Kasson Lamere, Lajean Manes

## 2011-12-23 NOTE — Discharge Summary (Signed)
Physician Discharge Summary  Patient ID: Jessica [name] MRN: [id number] DOB/AGE: 12-09-1960 51 y.o.  Admit date: 12/20/2011 Discharge date: 12/23/2011  Primary Cardiologist: [name], MD   Primary Discharge Diagnosis: 1 Acute/chronic SHF  2 Ventricular tachycardia  - status post recent ICD shock (x1), appropriate  3 Hypotension  4 Hypokalemia   Secondary Discharge Diagnoses: Past Medical History  Diagnosis Date  . Chronic systolic heart failure     a.  secondary to severe ischemic cardiomyopathy with EF 20-25%;  b. s/p rimplantation of [employer] single chamber ICD by Dr Ladona Ridgel 02/2008;  c.  Right heart cath 04/2007: RA 18, PA 56/30 (40), PCWP 36. FICK 3.89/2.06 Woods;  d. echo 9/09: Ef 20-25%, mild MR, mild LAE  . CAD (coronary artery disease)     a. s/p multiple PCIs; b. cath 10/07: LAD stent 40%, dx 80% ISR - tx with POBA; OM stent ok; ostial OM 60%; RCA occluded with L-R collats  . Tobacco abuse   . Alcohol abuse   . History of noncompliance with medical treatment   . Diabetes mellitus, type 2   . Dizziness   . HLD (hyperlipidemia)   . DVT (deep venous thrombosis)     coumadin  . Arterial embolism     s/p right iliofemoral embolectomy with fasciotomy  . V-tach   . AICD (automatic cardioverter/defibrillator) present   . Angina   . Asthma   . Shortness of breath   . Sleep apnea   . CHF (congestive heart failure)   . GERD (gastroesophageal reflux disease)   . Anxiety   . Myocardial infarction   . COPD (chronic obstructive pulmonary disease)     Hospital Course: Patient admitted directly from home for treatment of progressive SOB and edema, in setting of medication noncompliance. She was seen in CHF clinic earlier that day, with reported weight 149 pounds, at home. It was noted that she had had an appropriate ICD shock (x1), 2 days earlier. Subsequent ICD interrogation yielded VT 270 bpm, with subsequent successful restoration of NSR.  Patient was placed on an  aggressive diuretic regimen of IV Lasix, in addition to milrinone support. A PICC line was placed. Potassium, magnesium were supplemented. Admission Pro BNP level 3300. Patient was unable to have HF medications titrated, secondary to persistent hypotension.  Patient had good response to aggressive diuretic therapy, including milrinone support, and was cleared for discharge, by Dr. Gala Romney, on hospital day #2.  Final recommendations regarding medications: No ACE/ARB, secondary to persistent hypotension. Metformin to be resumed at DC, secondary to financial constraints with insulin.    Discharge Vitals: Blood pressure 92/54, pulse 84, temperature 98.4 F (36.9 C), temperature source Oral, resp. rate 15, height 5\' 4"  (1.626 m), weight 149 lb 14.6 oz (68 kg), SpO2 92.00%.  Labs: Lab Results  Component Value Date   WBC 5.6 12/20/2011   HGB 12.3 12/20/2011   HCT 36.1 12/20/2011   MCV 81.5 12/20/2011   PLT 232 12/20/2011      Lab 12/23/11 0412 12/20/11 1605  NA 131* --  K 4.0 --  CL 92* --  CO2 29 --  BUN 21 --  CREATININE 0.96 --  CALCIUM 9.6 --  ALBUMIN -- 3.2*  PROT -- 7.6  BILITOT -- 1.0  ALKPHOS -- 128*  ALT -- 11  AST -- 17  GLUCOSE 204* --    Lab Results  Component Value Date   CHOL 108 02/21/2011   HDL 20* 02/21/2011   LDLCALC 76 02/21/2011  TRIG 62 02/21/2011    No results found for this basename: DDIMER    Lab Results  Component Value Date   TSH 2.200 07/29/2010    No results found for this basename: CKTOTAL:3,CKMB:3,TROPONINI:3 in the last 72 hours  Diagnostic Studies: No results found.   DISPOSITION: Stable condition  FOLLOW UP PLANS AND APPOINTMENTS: Discharge Orders    Future Appointments: Provider: Department: Dept Phone: Center:   03/24/2012 11:15 AM [facility] [phone] [id number]         Follow-up Information    Follow up with Arvilla Meres, MD. In 5 days. (CHF clinic will call and  arrange)    Contact information:   [address] [place] [address] [phone]          DISCHARGE MEDICATIONS:   Medication List     As of 12/23/2011 10:50 AM    STOP taking these medications         metolazone 2.5 MG tablet   Commonly known as: ZAROXOLYN      TAKE these medications         albuterol 108 (90 BASE) MCG/ACT inhaler   Commonly known as: PROVENTIL HFA;VENTOLIN HFA   Inhale 2 puffs into the lungs every 6 (six) hours as needed. For shortness of breath      albuterol (2.5 MG/3ML) 0.083% nebulizer solution   Commonly known as: PROVENTIL   Take 2.5 mg by nebulization every 4 (four) hours as needed. For shortness of breath      aspirin 81 MG EC tablet   Take 81 mg by mouth daily.      digoxin 0.125 MG tablet   Commonly known as: LANOXIN   Take 125 mcg by mouth daily.      furosemide 40 MG tablet   Commonly known as: LASIX   Take 2 tablets (80 mg total) by mouth daily.      magnesium oxide 400 (241.3 MG) MG tablet   Commonly known as: MAG-OX   Take 1 tablet (400 mg total) by mouth daily.      metFORMIN 500 MG tablet   Commonly known as: GLUCOPHAGE   Take 1 tablet (500 mg total) by mouth 2 (two) times daily with a meal.      metoprolol succinate 25 MG 24 hr tablet   Commonly known as: TOPROL-XL   Take 1 tablet (25 mg total) by mouth daily.      nicotine 14 mg/24hr patch   Commonly known as: NICODERM CQ - dosed in mg/24 hours   Place 1 patch onto the skin daily.      nitroGLYCERIN 0.4 MG SL tablet   Commonly known as: NITROSTAT   Place 0.4 mg under the tongue every 5 (five) minutes as needed. For chest pain      pantoprazole 40 MG tablet   Commonly known as: PROTONIX   Take 40 mg by mouth daily.      potassium chloride SA 20 MEQ tablet   Commonly known as: K-DUR,KLOR-CON   Take 20 mEq by mouth daily.      sertraline 50 MG tablet   Commonly known as: ZOLOFT   Take 50 mg by mouth daily.      spironolactone 25 MG  tablet   Commonly known as: ALDACTONE   Take 25 mg by mouth daily.          BRING ALL MEDICATIONS WITH YOU TO FOLLOW UP APPOINTMENTS  Time spent with patient to  include physician time: Greater than 30 minutes, including physician time.  SignedShara Blazing, Cleota Pellerito 12/23/2011, 10:50 AM Co-Sign MD

## 2011-12-23 NOTE — Progress Notes (Signed)
Advanced Heart Failure Rounding Note   Subjective:    Jessica [name] is a 51 y.o. female with a history of severe systolic heart failure, secondary ischemic cardiomyopathy with an ejection fraction of 20-25%. She also has a history of coronary artery disease, status post multiple interventions, ICD implantation, diabetes, hyperlipidemia, history of ongoing tobacco and possibly alcohol use. Status post right iliofemoral embolectomy and compartment fasciotomy of the right leg by Dr. Darrick Penna with LLE DVT 06/2010. Coumadin was discontinued due to noncompliance but she did receive 6 months of therapy. She has a history of noncompliance. She has inolerance of ACE-I due to severe cough. Problem tolerating b-blocker and ARB due to low BP.   Last cardiac cath in 02/2011 showed severe 3 vessel CAD with occlusion of multiple stents, EF 10-15%. She was evaluated by Dr. Maren Beach for CABG but felt not to be a candidate. CABG. There was discussion with the patient concerning LVAD as an option, however patient declined. Medical therapy continued.   She had a shock at home 12/18/11.  Device interrogated with VT 270 bpm receiving 1 appropriate shock with return to SR.  Admitted from home after she was evaluated in HF clinic due to progressive dyspnea and weight gain. Admit Pro BNP started at 0.125 mcg via PICC. Admit weight 154 (baseline 143)  Lasix stopped yesterday afternoon . Negative 750cc weight down another pound. (149 today). Co-ox good   Feels better. Denies SOB/PND/CP. Orthopnea resolved. Electrolytes much improved. No further VT.   CVP 9   Objective:   Weight Range:  Vital Signs:   Temp:  [97.7 F (36.5 C)-98.5 F (36.9 C)] 98.4 F (36.9 C) (10/13 0730) Pulse Rate:  [84] 84  (10/13 0730) Resp:  [15-20] 15  (10/13 0730) BP: (83-100)/(53-68) 92/54 mmHg (10/13 0730) SpO2:  [92 %-96 %] 92 % (10/13 0730) Weight:  [68 kg (149 lb 14.6 oz)] 68 kg (149 lb 14.6 oz) (10/13 0454) Last BM Date:  12/20/11  Weight change: Filed Weights   12/21/11 0451 12/22/11 0500 12/23/11 0454  Weight: 69.355 kg (152 lb 14.4 oz) 68.04 kg (150 lb) 68 kg (149 lb 14.6 oz)    Intake/Output:   Intake/Output Summary (Last 24 hours) at 12/23/11 0926 Last data filed at 12/23/11 0600  Gross per 24 hour  Intake 1312.6 ml  Output   2040 ml  Net -727.4 ml     Physical Exam: CVP 15 General: Chronically ill appearing. No resp difficulty HEENT: normal Neck: supple. JVP 7-8 . Carotids 2+ bilat; no bruits. No lymphadenopathy or thryomegaly appreciated. Cor: PMI nondisplaced. Regular rate & rhythm. No rubs, or murmurs. S3 + Lungs: clear Abdomen: soft, nontender, distended. No hepatosplenomegaly. No bruits or masses. Good bowel sounds. Extremities: no cyanosis, clubbing, rash, edema Neuro: alert & orientedx3, cranial nerves grossly intact. moves all 4 extremities w/o difficulty. Affect pleasant  Telemetry: SR Labs: Basic Metabolic Panel:  Lab 12/23/11 0981 12/22/11 0430 12/21/11 0450 12/20/11 1605  NA 131* 133* 137 134*  K 4.0 4.4 3.5 3.6  CL 92* 92* 97 93*  CO2 29 30 29 27   GLUCOSE 204* 225* 134* 374*  BUN 21 21 17 15   CREATININE 0.96 1.11* 1.01 1.06  CALCIUM 9.6 9.4 9.3 --  MG -- -- 2.3 1.4*  PHOS -- -- -- --    Liver Function Tests:  Lab 12/20/11 1605  AST 17  ALT 11  ALKPHOS 128*  BILITOT 1.0  PROT 7.6  ALBUMIN 3.2*   No results found for  this basename: LIPASE:5,AMYLASE:5 in the last 168 hours No results found for this basename: AMMONIA:3 in the last 168 hours  CBC:  Lab 12/20/11 1605  WBC 5.6  NEUTROABS 4.2  HGB 12.3  HCT 36.1  MCV 81.5  PLT 232    Cardiac Enzymes: No results found for this basename: CKTOTAL:5,CKMB:5,CKMBINDEX:5,TROPONINI:5 in the last 168 hours  BNP: BNP (last 3 results)  Basename 12/20/11 1605 12/12/11 1055 09/27/11 1409  PROBNP 3292.0* 3083.0* 2407.0*     Other results:    Imaging: No results found.   Medications:     Scheduled  Medications:    . aspirin EC  81 mg Oral Daily  . digoxin  0.125 mg Oral Daily  . enoxaparin  40 mg Subcutaneous Daily  . furosemide  80 mg Oral Daily  . insulin aspart  0-15 Units Subcutaneous TID WC  . insulin glargine  15 Units Subcutaneous Daily  . magnesium oxide  400 mg Oral Daily  . metolazone  2.5 mg Oral Daily  . pantoprazole  40 mg Oral Q1200  . pneumococcal 23 valent vaccine  0.5 mL Intramuscular Tomorrow-1000  . potassium chloride  20 mEq Oral BID  . sertraline  50 mg Oral Daily  . sodium chloride  10-40 mL Intracatheter Q12H  . sodium chloride  3 mL Intravenous Q12H  . spironolactone  25 mg Oral Daily    Infusions:    . milrinone 0.125 mcg/kg/min (12/21/11 2332)    PRN Medications: sodium chloride, acetaminophen, ondansetron (ZOFRAN) IV, oxyCODONE-acetaminophen, sodium chloride, sodium chloride   Assessment:  1.Acute on chronic systolic HF with probable low output  2. ICM, EF 20%  3. VT s/p ICD shock on 10/8  4. 3V CAD with multiple stent occlusions  - not CABG candidate  5. DM2, poorly controlled  6. Tobacco abuse,ongoing  7. Hypokalemia  8. Hypomagnesemia     Plan/Discussion:    Much improved. Will stop milrinone and give one more dose of IV lasix this am and then can go home.  Looks like dry weight has increased from 142 closer to 148.  BP has been too low to titrate HF meds. CAD is non-revascularizable.  Meds on d/c   Lasix 80mg  daily Digoxin 0.125 daily Ecasa 81 Lovastatin 40 daily Toprol XL 25 daily Mag oxide 400 daily Spironolactone 25 daily Zoloft 50 daily KCL 20 daily Albuterol Metformin 500 bid (was on previously)  No ACE-I/ARB due to hypotension. Not taking insulin at home because she can't afford strips - will start back metformin  F/u in HF clinic this week. We will call her on Monday.  Reuel Boom Bensimhon,MD 9:34 AM    Length of Stay: 3 Daniel Bensimhon 12/23/2011, 9:26 AM

## 2011-12-26 ENCOUNTER — Ambulatory Visit (HOSPITAL_COMMUNITY): Admission: RE | Admit: 2011-12-26 | Payer: Medicare Other | Source: Ambulatory Visit

## 2011-12-29 NOTE — Discharge Summary (Signed)
Agree with D/C note. See my rounding note from the same day for additional details.  Daniel Bensimhon,MD 9:58 AM

## 2012-01-11 ENCOUNTER — Other Ambulatory Visit: Payer: Self-pay | Admitting: Internal Medicine

## 2012-01-11 ENCOUNTER — Other Ambulatory Visit (HOSPITAL_COMMUNITY): Payer: Self-pay | Admitting: Internal Medicine

## 2012-02-24 ENCOUNTER — Other Ambulatory Visit (HOSPITAL_COMMUNITY): Payer: Self-pay | Admitting: Adult Health

## 2012-03-24 ENCOUNTER — Encounter (HOSPITAL_COMMUNITY): Payer: Self-pay

## 2012-03-24 ENCOUNTER — Ambulatory Visit (HOSPITAL_COMMUNITY)
Admission: RE | Admit: 2012-03-24 | Discharge: 2012-03-24 | Disposition: A | Discharge status: Home / Self Care | Payer: Medicare Other | Source: Ambulatory Visit | Attending: Internal Medicine | Admitting: Internal Medicine

## 2012-03-24 ENCOUNTER — Encounter: Payer: Medicare Other | Admitting: *Deleted

## 2012-03-24 VITALS — BP 102/78 | HR 108 | Wt 146.1 lb

## 2012-03-24 DIAGNOSIS — I5022 Chronic systolic (congestive) heart failure: Secondary | ICD-10-CM | POA: Insufficient documentation

## 2012-03-24 DIAGNOSIS — I472 Ventricular tachycardia, unspecified: Secondary | ICD-10-CM | POA: Insufficient documentation

## 2012-03-24 DIAGNOSIS — I4729 Other ventricular tachycardia: Secondary | ICD-10-CM | POA: Insufficient documentation

## 2012-03-24 DIAGNOSIS — I2510 Atherosclerotic heart disease of native coronary artery without angina pectoris: Secondary | ICD-10-CM

## 2012-03-24 LAB — BASIC METABOLIC PANEL
BUN: 21 mg/dL (ref 6–23)
CO2: 27 mEq/L (ref 19–32)
Chloride: 94 mEq/L — ABNORMAL LOW (ref 96–112)
Glucose, Bld: 174 mg/dL — ABNORMAL HIGH (ref 70–99)
Potassium: 3.6 mEq/L (ref 3.5–5.1)

## 2012-03-24 MED ORDER — INSULIN GLARGINE 100 UNIT/ML ~~LOC~~ SOLN: 10.0000 [IU] | Freq: Every day | SUBCUTANEOUS | Status: DC

## 2012-03-24 MED ORDER — METOLAZONE 2.5 MG PO TABS: 2.5000 mg | ORAL_TABLET | ORAL | Status: DC | PRN

## 2012-03-24 MED ORDER — DIGOXIN 250 MCG PO TABS: 0.2500 mg | ORAL_TABLET | Freq: Every day | ORAL | Status: DC

## 2012-03-24 NOTE — Progress Notes (Signed)
PCP: Dr Dierdre Searles  HPI: Jessica [name] is a 52 y.o. female with a history of severe congestive heart failure, secondary ischemic cardiomyopathy with an ejection fraction of 25-30%. She also has a history of coronary artery disease, status post multiple interventions, ICD implantation, diabetes, hyperlipidemia, history of ongoing tobacco and possibly alcohol use.  Status post right iliofemoral embolectomy and compartment fasciotomy of the right leg by Dr. Darrick Penna with LLE DVT 06/2010.    She has a history of noncompliance.  She has inolerance of ACE-I due to severe cough.  Problem tolerating b-blocker and ARB due to low BP.    Cath 02/2011: showed severe 3 vessel CAD with occlusion of multiple stents, EF 10-15%  02/2011 R heart Cath: RA = 18, RV = 67/10/20, PA = 56/30 (41), PCW = 23, Ao = 97/72 (83), LV = 93/20/30, Fick cardiac output/index = 2.8/1.7, PVR = 6.5 Woods  SVR = 1878, FA sat = 88%, PA sat = 42%, 46%,   ECHO 02/22/11: Only basal function preserved. EF 20-25%.   Not a candidate for CABG. Discussion with patient about LVAD as an option, however patient declined.  Medical therapy continued.  Coumadin stopped for DVT due to noncompliance with 6 months of completed therapy.     She returns for routine follow up today.  She missed her last clinic appointment but says she has been doing well until he recently had the flu.  Weight is stable at home, 143 pounds.  She has not required extra diuretics.  She has been getting around fairly well prior to the flu.  She says her fever/chill and cough have subsided.  She has had two episodes of "funny feeling" in her chest that she feels was her ICD firing last week and the week prior.     ROS: All systems negative except as listed in HPI, PMH and Problem List.  Past Medical History  Diagnosis Date  . Chronic systolic heart failure     a.  secondary to severe ischemic cardiomyopathy with EF 20-25%;  b. s/p rimplantation of [employer] single chamber ICD by Dr Ladona Ridgel  02/2008;  c.  Right heart cath 04/2007: RA 18, PA 56/30 (40), PCWP 36. FICK 3.89/2.06 [place];  d. echo 9/09: Ef 20-25%, mild MR, mild LAE  . CAD (coronary artery disease)     a. s/p multiple PCIs; b. cath 10/07: LAD stent 40%, dx 80% ISR - tx with POBA; OM stent ok; ostial OM 60%; RCA occluded with L-R collats  . Tobacco abuse   . Alcohol abuse   . History of noncompliance with medical treatment   . Diabetes mellitus, type 2   . Dizziness   . HLD (hyperlipidemia)   . DVT (deep venous thrombosis)     coumadin  . Arterial embolism     s/p right iliofemoral embolectomy with fasciotomy  . V-tach   . AICD (automatic cardioverter/defibrillator) present   . Angina   . Asthma   . Shortness of breath   . Sleep apnea   . CHF (congestive heart failure)   . GERD (gastroesophageal reflux disease)   . Anxiety   . Myocardial infarction   . COPD (chronic obstructive pulmonary disease)     Current Outpatient Prescriptions  Medication Sig Dispense Refill  . albuterol (PROVENTIL HFA;VENTOLIN HFA) 108 (90 BASE) MCG/ACT inhaler Inhale 2 puffs into the lungs every 6 (six) hours as needed. For shortness of breath      . albuterol (PROVENTIL) (2.5 MG/3ML) 0.083% nebulizer solution Take  2.5 mg by nebulization every 4 (four) hours as needed. For shortness of breath      . digoxin (LANOXIN) 0.125 MG tablet Take 1 tablet (0.125 mg total) by mouth daily.  30 tablet  1  . furosemide (LASIX) 40 MG tablet Take 2 tablets (80 mg total) by mouth daily.  60 tablet  3  . KLOR-CON M20 20 MEQ tablet TAKE ONE TABLET BY MOUTH TWICE DAILY  60 tablet  6  . magnesium oxide (MAG-OX) 400 (241.3 MG) MG tablet Take 1 tablet (400 mg total) by mouth daily.  30 tablet  6  . metFORMIN (GLUCOPHAGE) 500 MG tablet Take 1 tablet (500 mg total) by mouth 2 (two) times daily with a meal.  60 tablet  1  . metolazone (ZAROXOLYN) 2.5 MG tablet TAKE ONE TABLET BY MOUTH EVERY DAY AS NEEDED  30 tablet  0  . metoprolol succinate (TOPROL-XL) 25 MG  24 hr tablet Take 1 tablet (25 mg total) by mouth daily.  30 tablet  6  . nicotine (NICODERM CQ - DOSED IN MG/24 HOURS) 14 mg/24hr patch Place 1 patch onto the skin daily.      . pantoprazole (PROTONIX) 40 MG tablet Take 40 mg by mouth daily.      . sertraline (ZOLOFT) 50 MG tablet Take 50 mg by mouth daily.       [name] spironolactone (ALDACTONE) 25 MG tablet Take 25 mg by mouth daily.      . digoxin (LANOXIN) 0.125 MG tablet Take 125 mcg by mouth daily.      . nitroGLYCERIN (NITROSTAT) 0.4 MG SL tablet Place 0.4 mg under the tongue every 5 (five) minutes as needed. For chest pain         PHYSICAL EXAM: Filed Vitals:   03/24/12 1555  BP: 102/78  Pulse: 108  Weight: 146 lb 1.9 oz (66.28 kg)  SpO2: 88%   General: Chronically ill appearing.  No resp difficulty HEENT:  normal Neck: supple. JVP flat.  Carotids 2+ bilaterally; no bruits. No lymphadenopathy or thryomegaly appreciated. Cor: PMI laterally displaced. Tachycardic rate & regular rhythm. soft  S3  Lungs: Clear Abdomen: obese, soft, nontender,  distended.  No hepatosplenomegaly. No bruits or masses. Good bowel sounds. Extremities: no cyanosis, clubbing, rash, edema Neuro: alert & orientedx3, cranial nerves grossly intact. Moves all 4 extremities w/o difficulty. Affect pleasant. Skin: Excoriations noted on her back with 3 partial thickness wounds noted 2x2 cm  ASSESSMENT & PLAN:

## 2012-03-24 NOTE — Patient Instructions (Addendum)
Increase digoxin 0.25 (2 tabs) daily.  Then pick up new prescription for 0.25 mg and start 1 tab daily.  Follow up 1 month.  Do the following things EVERYDAY: 1) Weigh yourself in the morning before breakfast. Write it down and keep it in a log. 2) Take your medicines as prescribed 3) Eat low salt foods-Limit salt (sodium) to 2000 mg per day.  4) Stay as active as you can everyday 5) Limit all fluids for the day to less than 2 liters

## 2012-03-25 NOTE — Assessment & Plan Note (Signed)
Not a bypass candidate.  No ischemic symptoms.  Continue current regimen.

## 2012-03-25 NOTE — Assessment & Plan Note (Signed)
Have called Kipp Brood with [employer].  Device transmits nightly and she has had no ICD fires or VT noted over the last month.  Have discussed with the patient.  She will call next time she feels like this has occurred.  Check BMET today.

## 2012-03-25 NOTE — Assessment & Plan Note (Addendum)
Volume status well controlled on current regimen.  She remains tachycardic, last digoxin level showed level <0.3 and Cr normal therefore will increase 0.25 mg daily.  Follow up with digoxin level at next appointment.  Check BMET today.  Functionally doing well, will continue toprol, spiro, dig and lasix.  No ACEI due to cough and no ARB due to hypotension.  Can try to add low dose hydralazine/nitrate but may not be an option due to soft BP.  Encouraged her to continue to weigh daily.

## 2012-03-27 ENCOUNTER — Encounter: Payer: Self-pay | Admitting: *Deleted

## 2012-04-24 ENCOUNTER — Encounter (HOSPITAL_COMMUNITY): Payer: Medicare Other

## 2012-05-07 ENCOUNTER — Encounter (HOSPITAL_COMMUNITY): Payer: Self-pay

## 2012-05-07 ENCOUNTER — Ambulatory Visit (HOSPITAL_COMMUNITY)
Admission: RE | Admit: 2012-05-07 | Discharge: 2012-05-07 | Disposition: A | Discharge status: Home / Self Care | Payer: Medicare Other | Source: Ambulatory Visit | Attending: Internal Medicine | Admitting: Internal Medicine

## 2012-05-07 VITALS — BP 94/66 | HR 87 | Wt 150.8 lb

## 2012-05-07 DIAGNOSIS — I2510 Atherosclerotic heart disease of native coronary artery without angina pectoris: Secondary | ICD-10-CM

## 2012-05-07 DIAGNOSIS — I5022 Chronic systolic (congestive) heart failure: Secondary | ICD-10-CM

## 2012-05-07 LAB — BASIC METABOLIC PANEL
BUN: 16 mg/dL (ref 6–23)
CO2: 30 mEq/L (ref 19–32)
Chloride: 95 mEq/L — ABNORMAL LOW (ref 96–112)
Creatinine, Ser: 1 mg/dL (ref 0.50–1.10)
Glucose, Bld: 241 mg/dL — ABNORMAL HIGH (ref 70–99)

## 2012-05-07 MED ORDER — POTASSIUM CHLORIDE CRYS ER 20 MEQ PO TBCR: 20.0000 meq | EXTENDED_RELEASE_TABLET | Freq: Two times a day (BID) | ORAL | Status: DC

## 2012-05-07 NOTE — Progress Notes (Signed)
PCP: Dr Dierdre Searles  HPI: Jessica [name] is a 52 y.o. female with a history of severe congestive heart failure, secondary ischemic cardiomyopathy with an ejection fraction of 25-30%. She also has a history of coronary artery disease, status post multiple interventions, ICD implantation, diabetes, hyperlipidemia, history of ongoing tobacco and possibly alcohol use.  Status post right iliofemoral embolectomy and compartment fasciotomy of the right leg by Dr. Darrick Penna with LLE DVT 06/2010.    She has a history of noncompliance.  She has inolerance of ACE-I due to severe cough.  Problem tolerating b-blocker and ARB due to low BP.    Cath 02/2011: showed severe 3 vessel CAD with occlusion of multiple stents, EF 10-15%  02/2011 R heart Cath: RA = 18, RV = 67/10/20, PA = 56/30 (41), PCW = 23, Ao = 97/72 (83), LV = 93/20/30, Fick cardiac output/index = 2.8/1.7, PVR = 6.5 Woods  SVR = 1878, FA sat = 88%, PA sat = 42%, 46%,   ECHO 02/22/11: Only basal function preserved. EF 20-25%.   Not a candidate for CABG. Discussion with patient about LVAD as an option, however patient declined.  Medical therapy continued.  Coumadin stopped for DVT due to noncompliance with 6 months of completed therapy.      She returns for routine follow up today.  She states that over the last day or so her weight has increased several pounds and she feels a little shortness of breath.  She endorses eating a lot of watermelon and feels this may be her problem.  She has not taken any medication today because she has not eaten.  She denies lower extremity edema.  +orthopnea.  No PND.  No chest pain.  She is excited to go visit her new granddaughter today.    ROS: All systems negative except as listed in HPI, PMH and Problem List.  Past Medical History  Diagnosis Date  . Chronic systolic heart failure     a.  secondary to severe ischemic cardiomyopathy with EF 20-25%;  b. s/p rimplantation of Biotronik single chamber ICD by Dr Ladona Ridgel 02/2008;  c.   Right heart cath 04/2007: RA 18, PA 56/30 (40), PCWP 36. FICK 3.89/2.06 Woods;  d. echo 9/09: Ef 20-25%, mild MR, mild LAE  . CAD (coronary artery disease)     a. s/p multiple PCIs; b. cath 10/07: LAD stent 40%, dx 80% ISR - tx with POBA; OM stent ok; ostial OM 60%; RCA occluded with L-R collats  . Tobacco abuse   . Alcohol abuse   . History of noncompliance with medical treatment   . Diabetes mellitus, type 2   . Dizziness   . HLD (hyperlipidemia)   . DVT (deep venous thrombosis)     coumadin  . Arterial embolism     s/p right iliofemoral embolectomy with fasciotomy  . V-tach   . AICD (automatic cardioverter/defibrillator) present   . Angina   . Asthma   . Shortness of breath   . Sleep apnea   . CHF (congestive heart failure)   . GERD (gastroesophageal reflux disease)   . Anxiety   . Myocardial infarction   . COPD (chronic obstructive pulmonary disease)     Current Outpatient Prescriptions  Medication Sig Dispense Refill  . albuterol (PROVENTIL HFA;VENTOLIN HFA) 108 (90 BASE) MCG/ACT inhaler Inhale 2 puffs into the lungs every 6 (six) hours as needed. For shortness of breath      . albuterol (PROVENTIL) (2.5 MG/3ML) 0.083% nebulizer solution Take 2.5 mg by  nebulization every 4 (four) hours as needed. For shortness of breath      . digoxin (LANOXIN) 0.125 MG tablet Take 125 mcg by mouth daily.      . digoxin (LANOXIN) 0.25 MG tablet Take 1 tablet (0.25 mg total) by mouth daily.  30 tablet  4  . furosemide (LASIX) 40 MG tablet Take 2 tablets (80 mg total) by mouth daily.  60 tablet  3  . insulin glargine (LANTUS) 100 UNIT/ML injection Inject 10 Units into the skin daily.  10 mL  1  . KLOR-CON M20 20 MEQ tablet TAKE ONE TABLET BY MOUTH TWICE DAILY  60 tablet  6  . magnesium oxide (MAG-OX) 400 (241.3 MG) MG tablet Take 1 tablet (400 mg total) by mouth daily.  30 tablet  6  . metolazone (ZAROXOLYN) 2.5 MG tablet Take 1 tablet (2.5 mg total) by mouth as needed.  10 tablet  2  .  metoprolol succinate (TOPROL-XL) 25 MG 24 hr tablet Take 1 tablet (25 mg total) by mouth daily.  30 tablet  6  . nicotine (NICODERM CQ - DOSED IN MG/24 HOURS) 14 mg/24hr patch Place 1 patch onto the skin daily.      . pantoprazole (PROTONIX) 40 MG tablet Take 40 mg by mouth daily.      [name] spironolactone (ALDACTONE) 25 MG tablet Take 25 mg by mouth daily.      . metFORMIN (GLUCOPHAGE) 500 MG tablet Take 1 tablet (500 mg total) by mouth 2 (two) times daily with a meal.  60 tablet  1  . nitroGLYCERIN (NITROSTAT) 0.4 MG SL tablet Place 0.4 mg under the tongue every 5 (five) minutes as needed. For chest pain      . sertraline (ZOLOFT) 50 MG tablet Take 50 mg by mouth daily.        No current facility-administered medications for this encounter.     PHYSICAL EXAM: Filed Vitals:   05/07/12 1159  BP: 94/66  Pulse: 87  Weight: 150 lb 12.8 oz (68.402 kg)  SpO2: 100%   General: Chronically ill appearing.  No resp difficulty HEENT:  normal Neck: supple. JVP 10.  Carotids 2+ bilaterally; no bruits. No lymphadenopathy or thryomegaly appreciated. Cor: PMI laterally displaced. Tachycardic rate & regular rhythm. soft  S3  Lungs: Clear Abdomen: obese, soft, nontender, + distention.  No hepatosplenomegaly. No bruits or masses. Good bowel sounds. Extremities: no cyanosis, clubbing, rash, tr edema Neuro: alert & orientedx3, cranial nerves grossly intact. Moves all 4 extremities w/o difficulty. Affect pleasant. Skin: Excoriations noted on her back with 3 partial thickness wounds noted 2x2 cm  ASSESSMENT & PLAN:

## 2012-05-07 NOTE — Patient Instructions (Addendum)
Take metolazone 2.5 mg today.  Take extra potassium today.    Labs today.  Follow up 3-4 weeks.

## 2012-05-07 NOTE — Assessment & Plan Note (Signed)
No ischemic symptoms, will continue current regimen.

## 2012-05-07 NOTE — Assessment & Plan Note (Signed)
NYHA III.  Volume status up 4 pounds today secondary to dietary indiscretions.  Will give metolazone 2.5 mg today with extra lasix.  If weight is not falling tomorrow she will repeat this regimen.  Will check labs today as she has had a problem with hypokalemia in the past.  Have re-educated on fluid restrictions and low sodium diets.  Will not titrate meds further as blood pressure is soft.

## 2012-05-28 ENCOUNTER — Encounter (HOSPITAL_COMMUNITY): Payer: Medicare Other

## 2012-06-03 ENCOUNTER — Other Ambulatory Visit (HOSPITAL_COMMUNITY): Payer: Self-pay | Admitting: Adult Health

## 2012-06-18 ENCOUNTER — Ambulatory Visit (HOSPITAL_COMMUNITY)
Admission: RE | Admit: 2012-06-18 | Discharge: 2012-06-18 | Disposition: A | Discharge status: Home / Self Care | Payer: Medicare Other | Source: Ambulatory Visit | Attending: Internal Medicine | Admitting: Internal Medicine

## 2012-06-18 VITALS — BP 102/78 | HR 95 | Wt 145.8 lb

## 2012-06-18 DIAGNOSIS — F1010 Alcohol abuse, uncomplicated: Secondary | ICD-10-CM | POA: Insufficient documentation

## 2012-06-18 DIAGNOSIS — Z9119 Patient's noncompliance with other medical treatment and regimen: Secondary | ICD-10-CM | POA: Insufficient documentation

## 2012-06-18 DIAGNOSIS — E119 Type 2 diabetes mellitus without complications: Secondary | ICD-10-CM | POA: Insufficient documentation

## 2012-06-18 DIAGNOSIS — F17200 Nicotine dependence, unspecified, uncomplicated: Secondary | ICD-10-CM | POA: Insufficient documentation

## 2012-06-18 DIAGNOSIS — J449 Chronic obstructive pulmonary disease, unspecified: Secondary | ICD-10-CM | POA: Insufficient documentation

## 2012-06-18 DIAGNOSIS — Z794 Long term (current) use of insulin: Secondary | ICD-10-CM | POA: Insufficient documentation

## 2012-06-18 DIAGNOSIS — I5022 Chronic systolic (congestive) heart failure: Secondary | ICD-10-CM

## 2012-06-18 DIAGNOSIS — E785 Hyperlipidemia, unspecified: Secondary | ICD-10-CM | POA: Insufficient documentation

## 2012-06-18 DIAGNOSIS — I509 Heart failure, unspecified: Secondary | ICD-10-CM | POA: Insufficient documentation

## 2012-06-18 DIAGNOSIS — K219 Gastro-esophageal reflux disease without esophagitis: Secondary | ICD-10-CM | POA: Insufficient documentation

## 2012-06-18 DIAGNOSIS — Z95810 Presence of automatic (implantable) cardiac defibrillator: Secondary | ICD-10-CM | POA: Insufficient documentation

## 2012-06-18 DIAGNOSIS — Z8679 Personal history of other diseases of the circulatory system: Secondary | ICD-10-CM | POA: Insufficient documentation

## 2012-06-18 DIAGNOSIS — Z91199 Patient's noncompliance with other medical treatment and regimen due to unspecified reason: Secondary | ICD-10-CM | POA: Insufficient documentation

## 2012-06-18 DIAGNOSIS — Z79899 Other long term (current) drug therapy: Secondary | ICD-10-CM | POA: Insufficient documentation

## 2012-06-18 DIAGNOSIS — I2589 Other forms of chronic ischemic heart disease: Secondary | ICD-10-CM | POA: Insufficient documentation

## 2012-06-18 DIAGNOSIS — Z86718 Personal history of other venous thrombosis and embolism: Secondary | ICD-10-CM | POA: Insufficient documentation

## 2012-06-18 DIAGNOSIS — J4489 Other specified chronic obstructive pulmonary disease: Secondary | ICD-10-CM | POA: Insufficient documentation

## 2012-06-18 DIAGNOSIS — F411 Generalized anxiety disorder: Secondary | ICD-10-CM | POA: Insufficient documentation

## 2012-06-18 DIAGNOSIS — I209 Angina pectoris, unspecified: Secondary | ICD-10-CM

## 2012-06-18 DIAGNOSIS — I2510 Atherosclerotic heart disease of native coronary artery without angina pectoris: Secondary | ICD-10-CM | POA: Insufficient documentation

## 2012-06-18 DIAGNOSIS — I252 Old myocardial infarction: Secondary | ICD-10-CM | POA: Insufficient documentation

## 2012-06-18 LAB — BASIC METABOLIC PANEL
Calcium: 9.8 mg/dL (ref 8.4–10.5)
GFR calc Af Amer: 75 mL/min — ABNORMAL LOW (ref >90)
GFR calc non Af Amer: 65 mL/min — ABNORMAL LOW (ref >90)
Glucose, Bld: 276 mg/dL — ABNORMAL HIGH (ref 70–99)
Sodium: 132 mEq/L — ABNORMAL LOW (ref 135–145)

## 2012-06-18 MED ORDER — ISOSORBIDE MONONITRATE ER 30 MG PO TB24: 30.0000 mg | ORAL_TABLET | Freq: Every day | ORAL | Status: DC

## 2012-06-18 NOTE — Patient Instructions (Addendum)
Add imdur 30 mg daily.  Labs today.  Follow up 1 month.

## 2012-06-19 ENCOUNTER — Telehealth (HOSPITAL_COMMUNITY): Payer: Self-pay | Admitting: Cardiology

## 2012-06-19 DIAGNOSIS — I5022 Chronic systolic (congestive) heart failure: Secondary | ICD-10-CM

## 2012-06-19 MED ORDER — FUROSEMIDE 40 MG PO TABS: 80.0000 mg | ORAL_TABLET | Freq: Two times a day (BID) | ORAL | Status: DC

## 2012-06-19 MED ORDER — POTASSIUM CHLORIDE CRYS ER 20 MEQ PO TBCR: 40.0000 meq | EXTENDED_RELEASE_TABLET | Freq: Every day | ORAL | Status: DC

## 2012-06-19 MED ORDER — DIGOXIN 250 MCG PO TABS: 0.2500 mg | ORAL_TABLET | Freq: Every day | ORAL | Status: DC

## 2012-06-19 MED ORDER — ISOSORBIDE MONONITRATE ER 30 MG PO TB24: 30.0000 mg | ORAL_TABLET | Freq: Every day | ORAL | Status: DC

## 2012-06-19 MED ORDER — METOLAZONE 2.5 MG PO TABS: 2.5000 mg | ORAL_TABLET | ORAL | Status: DC | PRN

## 2012-06-19 MED ORDER — METOPROLOL SUCCINATE ER 25 MG PO TB24: 25.0000 mg | ORAL_TABLET | Freq: Every day | ORAL | Status: DC

## 2012-06-19 MED ORDER — SPIRONOLACTONE 25 MG PO TABS: 25.0000 mg | ORAL_TABLET | Freq: Every day | ORAL | Status: DC

## 2012-06-19 NOTE — Telephone Encounter (Signed)
Pt requested that all meds be transferred to the [facility] [address]

## 2012-06-25 DIAGNOSIS — I209 Angina pectoris, unspecified: Secondary | ICD-10-CM | POA: Insufficient documentation

## 2012-06-25 NOTE — Progress Notes (Signed)
PCP: Dr Dierdre Searles  HPI: Jessica [name] is a 52 y.o. female with a history of severe congestive heart failure, secondary ischemic cardiomyopathy with an ejection fraction of 25-30%. She also has a history of coronary artery disease, status post multiple interventions, ICD implantation, diabetes, hyperlipidemia, history of ongoing tobacco and possibly alcohol use.  Status post right iliofemoral embolectomy and compartment fasciotomy of the right leg by Dr. Darrick Penna with LLE DVT 06/2010.    She has a history of noncompliance.  She has inolerance of ACE-I due to severe cough.  Problem tolerating b-blocker and ARB due to low BP.    Cath 02/2011: showed severe 3 vessel CAD with occlusion of multiple stents, EF 10-15% Not a candidate for CABG. Discussion with patient about LVAD as an option, however patient declined.  Medical therapy continued. 02/2011 R heart Cath: RA = 18, RV = 67/10/20, PA = 56/30 (41), PCW = 23, Ao = 97/72 (83), LV = 93/20/30, Fick cardiac output/index = 2.8/1.7, PVR = 6.5 Woods  SVR = 1878, FA sat = 88%, PA sat = 42%, 46%,   ECHO 02/22/11: Only basal function preserved. EF 20-25%.   She returns for follow up today.  She is doing pretty well.  She has congestion that improves with coughing.  She has also noted left shoulder/arm pain similar to prior angina.  This has happened ~3x over the last couple of weeks.  Pain 4/10.  She denies orthopnea/PND.  No edema.  Feels her memory is getting worse.  Compliant with medication and following fluid restrictions.    ROS: All systems negative except as listed in HPI, PMH and Problem List.  Past Medical History  Diagnosis Date  . Chronic systolic heart failure     a.  secondary to severe ischemic cardiomyopathy with EF 20-25%;  b. s/p rimplantation of Biotronik single chamber ICD by Dr Ladona Ridgel 02/2008;  c.  Right heart cath 04/2007: RA 18, PA 56/30 (40), PCWP 36. FICK 3.89/2.06 Woods;  d. echo 9/09: Ef 20-25%, mild MR, mild LAE  . CAD (coronary artery disease)      a. s/p multiple PCIs; b. cath 10/07: LAD stent 40%, dx 80% ISR - tx with POBA; OM stent ok; ostial OM 60%; RCA occluded with L-R collats  . Tobacco abuse   . Alcohol abuse   . History of noncompliance with medical treatment   . Diabetes mellitus, type 2   . Dizziness   . HLD (hyperlipidemia)   . DVT (deep venous thrombosis)     coumadin  . Arterial embolism     s/p right iliofemoral embolectomy with fasciotomy  . V-tach   . AICD (automatic cardioverter/defibrillator) present   . Angina   . Asthma   . Shortness of breath   . Sleep apnea   . CHF (congestive heart failure)   . GERD (gastroesophageal reflux disease)   . Anxiety   . Myocardial infarction   . COPD (chronic obstructive pulmonary disease)     Current Outpatient Prescriptions  Medication Sig Dispense Refill  . albuterol (PROVENTIL HFA;VENTOLIN HFA) 108 (90 BASE) MCG/ACT inhaler Inhale 2 puffs into the lungs every 6 (six) hours as needed. For shortness of breath      . albuterol (PROVENTIL) (2.5 MG/3ML) 0.083% nebulizer solution Take 2.5 mg by nebulization every 4 (four) hours as needed. For shortness of breath      . diazepam (VALIUM) 5 MG tablet Take 5 mg by mouth at bedtime as needed for anxiety.      [name]  insulin glargine (LANTUS) 100 UNIT/ML injection Inject 10 Units into the skin daily.  10 mL  1  . magnesium oxide (MAG-OX) 400 (241.3 MG) MG tablet Take 1 tablet (400 mg total) by mouth daily.  30 tablet  6  . metFORMIN (GLUCOPHAGE) 500 MG tablet Take 1 tablet (500 mg total) by mouth 2 (two) times daily with a meal.  60 tablet  1  . nicotine (NICODERM CQ - DOSED IN MG/24 HOURS) 14 mg/24hr patch Place 1 patch onto the skin daily.      . pantoprazole (PROTONIX) 40 MG tablet Take 40 mg by mouth daily.      . sertraline (ZOLOFT) 50 MG tablet Take 50 mg by mouth daily.       . digoxin (LANOXIN) 0.25 MG tablet Take 1 tablet (0.25 mg total) by mouth daily.  30 tablet  6  . furosemide (LASIX) 40 MG tablet Take 2 tablets (80  mg total) by mouth 2 (two) times daily.  60 tablet  6  . isosorbide mononitrate (IMDUR) 30 MG 24 hr tablet Take 1 tablet (30 mg total) by mouth daily.  30 tablet  6  . metolazone (ZAROXOLYN) 2.5 MG tablet Take 1 tablet (2.5 mg total) by mouth as needed.  10 tablet  2  . metoprolol succinate (TOPROL-XL) 25 MG 24 hr tablet Take 1 tablet (25 mg total) by mouth daily.  30 tablet  6  . nitroGLYCERIN (NITROSTAT) 0.4 MG SL tablet Place 0.4 mg under the tongue every 5 (five) minutes as needed. For chest pain      . potassium chloride SA (K-DUR,KLOR-CON) 20 MEQ tablet Take 2 tablets (40 mEq total) by mouth daily. May take extra tablet if you use metolazone.  70 tablet  6  . spironolactone (ALDACTONE) 25 MG tablet Take 1 tablet (25 mg total) by mouth daily.  30 tablet  6   No current facility-administered medications for this encounter.     PHYSICAL EXAM: Filed Vitals:   06/18/12 1603  BP: 102/78  Pulse: 95  Weight: 145 lb 12 oz (66.112 kg)  SpO2: 92%   General: Chronically ill appearing.  No resp difficulty HEENT:  normal Neck: supple. JVP 7-8.  Carotids 2+ bilaterally; no bruits. No lymphadenopathy or thryomegaly appreciated. Cor: PMI laterally displaced. Tachycardic rate & regular rhythm. soft  S3  Lungs: Clear Abdomen: obese, soft, nontender, nondistended.  No hepatosplenomegaly. No bruits or masses. Good bowel sounds. Extremities: no cyanosis, clubbing, rash, edema Neuro: alert & orientedx3, cranial nerves grossly intact. Moves all 4 extremities w/o difficulty. Affect pleasant.   ASSESSMENT & PLAN:

## 2012-06-25 NOTE — Assessment & Plan Note (Signed)
Stable NYHA III. Volume status looks good. Reinforced need for daily weights and reviewed use of sliding scale diuretics. Unable to tolerate ACE-I due to hypotension. Continue Toprol. Titrate as tolerated.

## 2012-06-25 NOTE — Assessment & Plan Note (Signed)
Will start Imdur. She has severe 3v CAD on last cath without any targets for revascularization. Will try Imdur 30 mg daily. If CP progresses may need cath to look for new lesion amenable to PCI.

## 2012-07-03 ENCOUNTER — Emergency Department (HOSPITAL_COMMUNITY): Payer: Medicare Other

## 2012-07-03 ENCOUNTER — Encounter: Payer: Self-pay | Admitting: Internal Medicine

## 2012-07-03 ENCOUNTER — Ambulatory Visit (INDEPENDENT_AMBULATORY_CARE_PROVIDER_SITE_OTHER): Payer: Medicare Other | Admitting: Internal Medicine

## 2012-07-03 ENCOUNTER — Emergency Department (HOSPITAL_COMMUNITY)
Admission: EM | Admit: 2012-07-03 | Discharge: 2012-07-03 | Disposition: A | Discharge status: Home / Self Care | Payer: Medicare Other | Attending: Emergency Medicine | Admitting: Emergency Medicine

## 2012-07-03 ENCOUNTER — Encounter: Payer: Medicare Other | Admitting: Internal Medicine

## 2012-07-03 VITALS — BP 110/84 | HR 80 | Resp 22

## 2012-07-03 DIAGNOSIS — Z79899 Other long term (current) drug therapy: Secondary | ICD-10-CM | POA: Insufficient documentation

## 2012-07-03 DIAGNOSIS — R0602 Shortness of breath: Secondary | ICD-10-CM | POA: Insufficient documentation

## 2012-07-03 DIAGNOSIS — I252 Old myocardial infarction: Secondary | ICD-10-CM | POA: Insufficient documentation

## 2012-07-03 DIAGNOSIS — J4489 Other specified chronic obstructive pulmonary disease: Secondary | ICD-10-CM | POA: Insufficient documentation

## 2012-07-03 DIAGNOSIS — J449 Chronic obstructive pulmonary disease, unspecified: Secondary | ICD-10-CM | POA: Insufficient documentation

## 2012-07-03 DIAGNOSIS — I209 Angina pectoris, unspecified: Secondary | ICD-10-CM | POA: Insufficient documentation

## 2012-07-03 DIAGNOSIS — E119 Type 2 diabetes mellitus without complications: Secondary | ICD-10-CM | POA: Insufficient documentation

## 2012-07-03 DIAGNOSIS — Z9889 Other specified postprocedural states: Secondary | ICD-10-CM | POA: Insufficient documentation

## 2012-07-03 DIAGNOSIS — Z8679 Personal history of other diseases of the circulatory system: Secondary | ICD-10-CM | POA: Insufficient documentation

## 2012-07-03 DIAGNOSIS — Z95810 Presence of automatic (implantable) cardiac defibrillator: Secondary | ICD-10-CM | POA: Insufficient documentation

## 2012-07-03 DIAGNOSIS — R42 Dizziness and giddiness: Secondary | ICD-10-CM | POA: Insufficient documentation

## 2012-07-03 DIAGNOSIS — I5022 Chronic systolic (congestive) heart failure: Secondary | ICD-10-CM | POA: Insufficient documentation

## 2012-07-03 DIAGNOSIS — K219 Gastro-esophageal reflux disease without esophagitis: Secondary | ICD-10-CM | POA: Insufficient documentation

## 2012-07-03 DIAGNOSIS — F17200 Nicotine dependence, unspecified, uncomplicated: Secondary | ICD-10-CM | POA: Insufficient documentation

## 2012-07-03 DIAGNOSIS — R079 Chest pain, unspecified: Secondary | ICD-10-CM | POA: Insufficient documentation

## 2012-07-03 DIAGNOSIS — Z794 Long term (current) use of insulin: Secondary | ICD-10-CM | POA: Insufficient documentation

## 2012-07-03 DIAGNOSIS — E785 Hyperlipidemia, unspecified: Secondary | ICD-10-CM | POA: Insufficient documentation

## 2012-07-03 DIAGNOSIS — I2510 Atherosclerotic heart disease of native coronary artery without angina pectoris: Secondary | ICD-10-CM | POA: Insufficient documentation

## 2012-07-03 DIAGNOSIS — Z86718 Personal history of other venous thrombosis and embolism: Secondary | ICD-10-CM | POA: Insufficient documentation

## 2012-07-03 DIAGNOSIS — F411 Generalized anxiety disorder: Secondary | ICD-10-CM | POA: Insufficient documentation

## 2012-07-03 LAB — COMPREHENSIVE METABOLIC PANEL
AST: 19 U/L (ref 0–37)
Albumin: 3.2 g/dL — ABNORMAL LOW (ref 3.5–5.2)
Alkaline Phosphatase: 139 U/L — ABNORMAL HIGH (ref 39–117)
BUN: 27 mg/dL — ABNORMAL HIGH (ref 6–23)
Creatinine, Ser: 1.03 mg/dL (ref 0.50–1.10)
Potassium: 3.5 mEq/L (ref 3.5–5.1)
Total Protein: 8 g/dL (ref 6.0–8.3)

## 2012-07-03 LAB — CBC
HCT: 41.4 % (ref 36.0–46.0)
MCV: 79.2 fL (ref 78.0–100.0)
RBC: 5.23 MIL/uL — ABNORMAL HIGH (ref 3.87–5.11)
WBC: 4.8 10*3/uL (ref 4.0–10.5)

## 2012-07-03 LAB — TROPONIN I: Troponin I: <0.3 ng/mL (ref <0.30)

## 2012-07-03 LAB — POCT I-STAT TROPONIN I: Troponin i, poc: 0.01 ng/mL (ref 0.00–0.08)

## 2012-07-03 NOTE — ED Notes (Signed)
Pt here from the clinics downstairs. Pt reports she walked from the [place] tower to the clinic. Pt started having chest pain when she got downstairs. Pt with cardiac hx with a pacemaker. Denies any pain at this time.

## 2012-07-03 NOTE — ED Notes (Signed)
Patient transported to X-ray 

## 2012-07-03 NOTE — ED Provider Notes (Signed)
History     CSN: [id number]  Arrival date & time 07/03/12  1422   First MD Initiated Contact with Patient 07/03/12 1444      Chief Complaint  Patient presents with  . Chest Pain    (Consider location/radiation/quality/duration/timing/severity/associated sxs/prior treatment) HPI Comments: Patient presents to the ED for an episode of chest pain. She reports she walked from the [place] tower to the downstairs clinic, and started having chest pain when she arrived at her destination.  Chest pain was described as constant, sharp, centralized and nonradiating associated with shortness of and lightheadedness.  Patient states that chest pain lasted approx 5 minutes and resolved quickly when she sat down and rested.  Patient now reports that she feels fine. Patient has a history of chronic systolic heart failure, CAD, hyperlipidemia, angina, CHF, MI, and COPD.  Patient has a pacemaker, scheduled for interrogation tomorrow with her cardiologist, Dr. Teressa Lower.  Patient denies any current chest pain, SOB, lightheadedness, weakness, or dizziness.  The history is provided by the patient.    Past Medical History  Diagnosis Date  . Chronic systolic heart failure     a.  secondary to severe ischemic cardiomyopathy with EF 20-25%;  b. s/p rimplantation of Biotronik single chamber ICD by Dr Ladona Ridgel 02/2008;  c.  Right heart cath 04/2007: RA 18, PA 56/30 (40), PCWP 36. FICK 3.89/2.06 Woods;  d. echo 9/09: Ef 20-25%, mild MR, mild LAE  . CAD (coronary artery disease)     a. s/p multiple PCIs; b. cath 10/07: LAD stent 40%, dx 80% ISR - tx with POBA; OM stent ok; ostial OM 60%; RCA occluded with L-R collats  . Tobacco abuse   . Alcohol abuse   . History of noncompliance with medical treatment   . Diabetes mellitus, type 2   . Dizziness   . HLD (hyperlipidemia)   . DVT (deep venous thrombosis)     coumadin  . Arterial embolism     s/p right iliofemoral embolectomy with fasciotomy  . V-tach   . AICD  (automatic cardioverter/defibrillator) present   . Angina   . Asthma   . Shortness of breath   . Sleep apnea   . CHF (congestive heart failure)   . GERD (gastroesophageal reflux disease)   . Anxiety   . Myocardial infarction   . COPD (chronic obstructive pulmonary disease)     Past Surgical History  Procedure Laterality Date  . Diagnostic laparoscopy    . Cardiac catheterization    . Insert / replace / remove pacemaker    . Tubal ligation      Family History  Problem Relation Age of Onset  . Coronary artery disease      FMILY HISTORY    History  Substance Use Topics  . Smoking status: Current Some Day Smoker -- 0.25 packs/day for 21 years    Types: Cigarettes  . Smokeless tobacco: Never Used  . Alcohol Use: No    OB History   Grav Para Term Preterm Abortions TAB SAB Ect Mult Living                  Review of Systems  Respiratory: Positive for shortness of breath.   Cardiovascular: Positive for chest pain.  Neurological: Positive for light-headedness.  All other systems reviewed and are negative.    Allergies  Latex and Ramipril  Home Medications   Current Outpatient Rx  Name  Route  Sig  Dispense  Refill  . albuterol (PROVENTIL HFA;VENTOLIN  HFA) 108 (90 BASE) MCG/ACT inhaler   Inhalation   Inhale 2 puffs into the lungs every 6 (six) hours as needed. For shortness of breath         . albuterol (PROVENTIL) (2.5 MG/3ML) 0.083% nebulizer solution   Nebulization   Take 2.5 mg by nebulization every 4 (four) hours as needed. For shortness of breath         . diazepam (VALIUM) 5 MG tablet   Oral   Take 5 mg by mouth at bedtime as needed for anxiety.         . digoxin (LANOXIN) 0.25 MG tablet   Oral   Take 0.25 mg by mouth daily.         . furosemide (LASIX) 40 MG tablet   Oral   Take 80 mg by mouth 2 (two) times daily.         . insulin glargine (LANTUS) 100 UNIT/ML injection   Subcutaneous   Inject 10 Units into the skin daily.          . metolazone (ZAROXOLYN) 2.5 MG tablet   Oral   Take 2.5 mg by mouth as needed (for fluid retention).         . metoprolol succinate (TOPROL-XL) 25 MG 24 hr tablet   Oral   Take 25 mg by mouth daily.         . naproxen sodium (ANAPROX) 220 MG tablet   Oral   Take 440 mg by mouth 2 (two) times daily as needed (for pain).         . nicotine (NICODERM CQ - DOSED IN MG/24 HOURS) 14 mg/24hr patch   Transdermal   Place 1 patch onto the skin daily.         . nitroGLYCERIN (NITROSTAT) 0.4 MG SL tablet   Sublingual   Place 0.4 mg under the tongue every 5 (five) minutes as needed for chest pain.         . pantoprazole (PROTONIX) 40 MG tablet   Oral   Take 40 mg by mouth daily.         . potassium chloride SA (K-DUR,KLOR-CON) 20 MEQ tablet   Oral   Take 40 mEq by mouth daily. May take extra tablet if you use metolazone.         . sertraline (ZOLOFT) 50 MG tablet   Oral   Take 50 mg by mouth daily.          [name] spironolactone (ALDACTONE) 25 MG tablet   Oral   Take 25 mg by mouth daily.           BP 162/80  Pulse 83  Temp(Src) 98.6 F (37 C) (Oral)  Resp 18  SpO2 98%  Physical Exam  Nursing note and vitals reviewed. Constitutional: She is oriented to person, place, and time. She appears well-developed and well-nourished.  HENT:  Head: Normocephalic and atraumatic.  Mouth/Throat: Oropharynx is clear and moist.  Eyes: Conjunctivae and EOM are normal. Pupils are equal, round, and reactive to light.  Neck: Normal range of motion.  Cardiovascular: Normal rate, regular rhythm and normal heart sounds.   Pulmonary/Chest: Effort normal and breath sounds normal.  Abdominal: Soft. Bowel sounds are normal. There is no tenderness. There is no guarding.  Musculoskeletal: Normal range of motion.  Neurological: She is alert and oriented to person, place, and time. She has normal strength. No cranial nerve deficit or sensory deficit.  No acute neuro deficits or facial  droop  appreciated  Skin: Skin is warm and dry.  Psychiatric: She has a normal mood and affect.    ED Course  Procedures (including critical care time)   Date: 07/03/2012  Rate: 82  Rhythm: normal sinus rhythm  QRS Axis: right  Intervals: normal  ST/T Wave abnormalities: nonspecific T wave changes  Conduction Disutrbances:none  Narrative Interpretation: RAD, borderline prolonged PR interval  Old EKG Reviewed: none available    Labs Reviewed  COMPREHENSIVE METABOLIC PANEL - Abnormal; Notable for the following:    Glucose, Bld 205 (*)    BUN 27 (*)    Albumin 3.2 (*)    Alkaline Phosphatase 139 (*)    GFR calc non Af Amer 61 (*)    GFR calc Af Amer 71 (*)    All other components within normal limits  CBC - Abnormal; Notable for the following:    RBC 5.23 (*)    All other components within normal limits  PRO B NATRIURETIC PEPTIDE - Abnormal; Notable for the following:    Pro B Natriuretic peptide (BNP) 2773.0 (*)    All other components within normal limits  TROPONIN I  TROPONIN I  TROPONIN I  POCT I-STAT TROPONIN I   Dg Chest 2 View  07/03/2012  *RADIOLOGY REPORT*  Clinical Data: Chest pain  CHEST - 2 VIEW  Comparison:  September 28, 2011  Findings:  Heart is enlarged but stable.  Pacemaker lead is attached to the right ventricle. There is evidence of a degree of pulmonary venous hypertension.  There is trace interstitial edema.  No airspace consolidation.  No adenopathy.  No bone lesions.  IMPRESSION: Stable cardiac enlargement with a degree of pulmonary venous hypertension. There is trace interstitial edema. These findings raise concern for a degree of congestive heart failure.  There is no airspace consolidation.   Original Report Authenticated By: Bretta Bang, M.D.      1. Angina pectoris       MDM   Patient presented to the ED for an episode of chest pain after walking from the [place] tower to her downstairs clinic.  Pain was short-lived and has completely  resolved at this time.  Cardiac workup negative, EKG without acute changes.  Patient has a previously scheduled appointment with her cardiologist tomorrow for pacemaker interrogation.  Patient will have second troponin- if normal, I feel she can be safely discharged and follow up with her cardiologist tomorrow.  Signed out to Dr. Preston Fleeting for continuation of care and disposition once second troponin has been completed.      Garlon Hatchet, PA-C 07/04/12 [phone]

## 2012-07-03 NOTE — Progress Notes (Signed)
Subjective:   Patient ID: Jessica [name] female   DOB: 08-Jun-1960 52 y.o.   MRN: [id number]  HPI: Jessica [name] is a 52 y.o. female with a history of a severe congestive heart failure, secondary ischemic cardiomyopathy with an ejection fraction of 25-30%, coronary artery disease with status post multiple interventions, ICD implantation, diabetes, hyperlipidemia, history of ongoing tobacco and possible alcohol abuse, medical noncompliance.  Status post a right iliofemoral embolectomy and compartment fasciotomy of the right leg by Dr. Darrick Penna with LLE DVT 06/2010. Status post cardiac cath on 02/2011 with severe three-vessel CAD with occlusion of a multiple stents.  EF 10-15%  She has intolerance of her ACEI due to the severe cough.  Problem tolerating beta blocker and ARB due to low blood pressure.  Not a candidate for CABG.  Patient declined LVAD.  Coumadin stopped for DVT do to noncompliance with 6 months of completed therapy.  Patient presents to the clinic today to establish care.  She complains of one-hour exertional left-sided chest pressure and chest pain after" I walked in the hospital for one hour".  Her chest pain is 8/10 with no radiation.  Patient appears to be anxious, diaphoresis, and tearing.  No other complaints.  Denies fever, chills, congestion, shortness breath, cough, wheezing, palpitation, nausea, vomiting, abdominal pain, diarrhea, constipation, dysuria, dizziness, weakness, numbness and tingling.   Past Medical History  Diagnosis Date  . Chronic systolic heart failure     a.  secondary to severe ischemic cardiomyopathy with EF 20-25%;  b. s/p rimplantation of Biotronik single chamber ICD by Dr Ladona Ridgel 02/2008;  c.  Right heart cath 04/2007: RA 18, PA 56/30 (40), PCWP 36. FICK 3.89/2.06 Woods;  d. echo 9/09: Ef 20-25%, mild MR, mild LAE  . CAD (coronary artery disease)     a. s/p multiple PCIs; b. cath 10/07: LAD stent 40%, dx 80% ISR - tx with POBA; OM stent ok; ostial OM  60%; RCA occluded with L-R collats  . Tobacco abuse   . Alcohol abuse   . History of noncompliance with medical treatment   . Diabetes mellitus, type 2   . Dizziness   . HLD (hyperlipidemia)   . DVT (deep venous thrombosis)     coumadin  . Arterial embolism     s/p right iliofemoral embolectomy with fasciotomy  . V-tach   . AICD (automatic cardioverter/defibrillator) present   . Angina   . Asthma   . Shortness of breath   . Sleep apnea   . CHF (congestive heart failure)   . GERD (gastroesophageal reflux disease)   . Anxiety   . Myocardial infarction   . COPD (chronic obstructive pulmonary disease)    Current Outpatient Prescriptions  Medication Sig Dispense Refill  . albuterol (PROVENTIL HFA;VENTOLIN HFA) 108 (90 BASE) MCG/ACT inhaler Inhale 2 puffs into the lungs every 6 (six) hours as needed. For shortness of breath      . albuterol (PROVENTIL) (2.5 MG/3ML) 0.083% nebulizer solution Take 2.5 mg by nebulization every 4 (four) hours as needed. For shortness of breath      . diazepam (VALIUM) 5 MG tablet Take 5 mg by mouth at bedtime as needed for anxiety.      . digoxin (LANOXIN) 0.25 MG tablet Take 0.25 mg by mouth daily.      . furosemide (LASIX) 40 MG tablet Take 80 mg by mouth 2 (two) times daily.      . insulin glargine (LANTUS) 100 UNIT/ML injection Inject 10 Units into the skin  daily.      . metolazone (ZAROXOLYN) 2.5 MG tablet Take 2.5 mg by mouth as needed (for fluid retention).      . metoprolol succinate (TOPROL-XL) 25 MG 24 hr tablet Take 25 mg by mouth daily.      . naproxen sodium (ANAPROX) 220 MG tablet Take 440 mg by mouth 2 (two) times daily as needed (for pain).      . nicotine (NICODERM CQ - DOSED IN MG/24 HOURS) 14 mg/24hr patch Place 1 patch onto the skin daily.      . nitroGLYCERIN (NITROSTAT) 0.4 MG SL tablet Place 0.4 mg under the tongue every 5 (five) minutes as needed for chest pain.      . pantoprazole (PROTONIX) 40 MG tablet Take 40 mg by mouth daily.       . potassium chloride SA (K-DUR,KLOR-CON) 20 MEQ tablet Take 40 mEq by mouth daily. May take extra tablet if you use metolazone.      . sertraline (ZOLOFT) 50 MG tablet Take 50 mg by mouth daily.       [name] spironolactone (ALDACTONE) 25 MG tablet Take 25 mg by mouth daily.       No current facility-administered medications for this visit.   Family History  Problem Relation Age of Onset  . Coronary artery disease      FMILY HISTORY   History   Social History  . Marital Status: Divorced    Spouse Name: N/A    Number of Children: N/A  . Years of Education: N/A   Occupational History  . disabled    Social History Main Topics  . Smoking status: Current Some Day Smoker -- 0.25 packs/day for 21 years    Types: Cigarettes  . Smokeless tobacco: Never Used  . Alcohol Use: No  . Drug Use: No  . Sexually Active: No   Other Topics Concern  . None   Social History Narrative  . None   Review of Systems: See hpi Objective:  Physical Exam: Filed Vitals:   07/03/12 1652  BP: 110/84  Pulse: 80  Resp: 22  SpO2: 98%   General: Anxious, tearing and diaphoresis.  Head: normocephalic and atraumatic.  Eyes: vision grossly intact, pupils equal, pupils round, pupils reactive to light, no injection and anicteric.  Mouth: pharynx pink and moist, no erythema, and no exudates.  Neck: supple, full ROM, no thyromegaly, no JVD, and no carotid bruits.  Lungs: normal respiratory effort, no accessory muscle use, normal breath sounds, no crackles, and no wheezes. Heart: normal rate, regular rhythm, no murmur, no gallop, and no rub.  Abdomen: soft, non-tender, normal bowel sounds, no distention, no guarding, no rebound tenderness, no hepatomegaly, and no splenomegaly.  Msk: no joint swelling, no joint warmth, and no redness over joints.  Pulses: 2+ DP/PT pulses bilaterally Extremities: No cyanosis, clubbing, edema Neurologic: alert & oriented X3, cranial nerves II-XII intact, strength normal in  all extremities, sensation intact to light touch, and gait normal.  Skin: turgor normal and no rashes.  Psych: Oriented X3, memory intact for recent and remote, normally interactive, good eye contact, not anxious appearing, and not depressed appearing.    Assessment & Plan:

## 2012-07-03 NOTE — Patient Instructions (Addendum)
Patient will be sent to the emergency room,

## 2012-07-03 NOTE — ED Provider Notes (Signed)
52 year old female with history of coronary artery disease and congestive heart failure states that she had walked a long distance at the time hospital trying to find the clinic she was going to. She developed chest pain with this long walk which was relieved with 2 nitroglycerin. Pain lasted only about 5 minutes. Pain was moderate and she rated at 6/10. There is no associated dyspnea, nausea, vomiting, diaphoresis. She has an appointment tomorrow with her cardiologist. On exam, lungs are clear and heart has regular rate and rhythm. She has no peripheral edema. She will need to be kept in the ED for hour and 6 hour troponin but it seems that she had an episode of stable angina.   Date: 07/03/2012  Rate: 82  Rhythm: normal sinus rhythm  QRS Axis: right  Intervals: normal  ST/T Wave abnormalities: nonspecific T wave changes  Conduction Disutrbances:none  Narrative Interpretation: right atrial hypertrophy, left atrial hypertrophy, right axis deviation, minor nonspecific T wave flattening. No prior ECG available for comparison.  Old EKG Reviewed: none available  She has had no further chest pain and troponin is -6 hours after resolution of pain. She is discharged with instructions to followup with her cardiologist tomorrow as scheduled.  Results for orders placed during the hospital encounter of 07/03/12  COMPREHENSIVE METABOLIC PANEL      Result Value Range   Sodium 136  135 - 145 mEq/L   Potassium 3.5  3.5 - 5.1 mEq/L   Chloride 97  96 - 112 mEq/L   CO2 29  19 - 32 mEq/L   Glucose, Bld 205 (*) 70 - 99 mg/dL   BUN 27 (*) 6 - 23 mg/dL   Creatinine, Ser 1.61  0.50 - 1.10 mg/dL   Calcium 9.2  8.4 - 09.6 mg/dL   Total Protein 8.0  6.0 - 8.3 g/dL   Albumin 3.2 (*) 3.5 - 5.2 g/dL   AST 19  0 - 37 U/L   ALT 8  0 - 35 U/L   Alkaline Phosphatase 139 (*) 39 - 117 U/L   Total Bilirubin 0.8  0.3 - 1.2 mg/dL   GFR calc non Af Amer 61 (*) >90 mL/min   GFR calc Af Amer 71 (*) >90 mL/min  CBC   Result Value Range   WBC 4.8  4.0 - 10.5 K/uL   RBC 5.23 (*) 3.87 - 5.11 MIL/uL   Hemoglobin 14.1  12.0 - 15.0 g/dL   HCT 04.5  40.9 - 81.1 %   MCV 79.2  78.0 - 100.0 fL   MCH 27.0  26.0 - 34.0 pg   MCHC 34.1  30.0 - 36.0 g/dL   RDW 91.4  78.2 - 95.6 %   Platelets 171  150 - 400 K/uL  PRO B NATRIURETIC PEPTIDE      Result Value Range   Pro B Natriuretic peptide (BNP) 2773.0 (*) 0 - 125 pg/mL  TROPONIN I      Result Value Range   Troponin I <0.30  <0.30 ng/mL  TROPONIN I      Result Value Range   Troponin I <0.30  <0.30 ng/mL  TROPONIN I      Result Value Range   Troponin I <0.30  <0.30 ng/mL  POCT I-STAT TROPONIN I      Result Value Range   Troponin i, poc 0.01  0.00 - 0.08 ng/mL   Comment 3            Dg Chest 2 View  07/03/2012  *  RADIOLOGY REPORT*  Clinical Data: Chest pain  CHEST - 2 VIEW  Comparison:  September 28, 2011  Findings:  Heart is enlarged but stable.  Pacemaker lead is attached to the right ventricle. There is evidence of a degree of pulmonary venous hypertension.  There is trace interstitial edema.  No airspace consolidation.  No adenopathy.  No bone lesions.  IMPRESSION: Stable cardiac enlargement with a degree of pulmonary venous hypertension. There is trace interstitial edema. These findings raise concern for a degree of congestive heart failure.  There is no airspace consolidation.   Original Report Authenticated By: Bretta Bang, M.D.       Medical screening examination/treatment/procedure(s) were conducted as a shared visit with non-physician practitioner(s) and myself.  I personally evaluated the patient during the encounter   Dione Booze, MD 07/03/12 2120

## 2012-07-03 NOTE — Assessment & Plan Note (Signed)
1. Chest pain rule out.    The patient is a 52 year old woman who presents with one hour exertional chest pain/chest pressure in the setting of advanced coronary artery disease with multiple occluded stents and ischemic cardiomyopathy.   The likelihood of unstable angina is high given her history and clinical manifestation. She is hemodynamically stable with good vital signs and oxygenation.  She receives 1 tablet of nitroglycerin sublingual with complete resolution of her chest pain.  The EKG in the clinic is unremarkable for any acute ischemic changes.  She will be sent to the emergency room for further evaluation and management.  I've called emergency room on-call physician and discussed the case with him.   The other differential diagnoses include musculoskeletal pain/GERD/pericarditis/aortic dissection/PE E./pneumonia.  Pericarditis is unlikely given her unremarkable EKG and nature of her chest pain which is exertional.  The clinical manifestation is not consistent with aortic dissection and she is hemodynamically stable.  A chest x-ray could be helpful to assist the diagnosis of aortic dissection versus pneumonia.  PE is unlikely since she has no tachycardia or tachypnea, and denies long distance travel.  Her Geneva score is 6 which puts her at the intermittent probability, part of which is due to her HR of 82.  If she continues to have chest pain and all other etiologies are ruled out, the workup for PE may be indicated.  However I will leave all the workup decisions to the ED physician at this point.    Above evaluation and management discussed with Dr. Aundria Rud as well.

## 2012-07-04 ENCOUNTER — Encounter: Payer: Self-pay | Admitting: Internal Medicine

## 2012-07-04 ENCOUNTER — Ambulatory Visit (INDEPENDENT_AMBULATORY_CARE_PROVIDER_SITE_OTHER): Payer: Medicare Other | Admitting: Internal Medicine

## 2012-07-04 VITALS — BP 106/77 | HR 90 | Ht 63.0 in | Wt 147.8 lb

## 2012-07-04 DIAGNOSIS — Z95810 Presence of automatic (implantable) cardiac defibrillator: Secondary | ICD-10-CM

## 2012-07-04 DIAGNOSIS — I472 Ventricular tachycardia: Secondary | ICD-10-CM

## 2012-07-04 DIAGNOSIS — I5022 Chronic systolic (congestive) heart failure: Secondary | ICD-10-CM

## 2012-07-04 NOTE — Patient Instructions (Addendum)
Your physician wants you to follow-up in: 12 months with Dr. Taylor. You will receive a reminder letter in the mail two months in advance. If you don't receive a letter, please call our office to schedule the follow-up appointment.    

## 2012-07-05 ENCOUNTER — Encounter: Payer: Self-pay | Admitting: Internal Medicine

## 2012-07-05 NOTE — Assessment & Plan Note (Signed)
She has had no recurrent ventricular arrhythmia. No ICD therapy. We will make no change in her medical therapy today.

## 2012-07-05 NOTE — Progress Notes (Signed)
HPI Mrs. Kuri returns today for followup. She is a very pleasant 52 year old woman with an ischemic cardiomyopathy, chronic systolic heart failure , status post ICD implantation.several months ago, she had an appropriate ICD shock for very rapid ventricular tachycardia. In the interim, she has been stable with class III heart failure. She denies chest pain. She has minimal peripheral edema. She has tried to adhere to a low-sodium diet. She denies medical noncompliance. No recent ICD discharges. Allergies  Allergen Reactions  . Latex Rash  . Ramipril Cough     Current Outpatient Prescriptions  Medication Sig Dispense Refill  . albuterol (PROVENTIL HFA;VENTOLIN HFA) 108 (90 BASE) MCG/ACT inhaler Inhale 2 puffs into the lungs every 6 (six) hours as needed. For shortness of breath      . albuterol (PROVENTIL) (2.5 MG/3ML) 0.083% nebulizer solution Take 2.5 mg by nebulization every 4 (four) hours as needed. For shortness of breath      . diazepam (VALIUM) 5 MG tablet Take 5 mg by mouth at bedtime as needed for anxiety.      . digoxin (LANOXIN) 0.25 MG tablet Take 0.25 mg by mouth daily.      . furosemide (LASIX) 40 MG tablet Take 80 mg by mouth 2 (two) times daily.      . insulin glargine (LANTUS) 100 UNIT/ML injection Inject 10 Units into the skin daily.      . metolazone (ZAROXOLYN) 2.5 MG tablet Take 2.5 mg by mouth as needed (for fluid retention).      . metoprolol succinate (TOPROL-XL) 25 MG 24 hr tablet Take 25 mg by mouth daily.      . naproxen sodium (ANAPROX) 220 MG tablet Take 440 mg by mouth 2 (two) times daily as needed (for pain).      . nicotine (NICODERM CQ - DOSED IN MG/24 HOURS) 14 mg/24hr patch Place 1 patch onto the skin daily.      . nitroGLYCERIN (NITROSTAT) 0.4 MG SL tablet Place 0.4 mg under the tongue every 5 (five) minutes as needed for chest pain.      . pantoprazole (PROTONIX) 40 MG tablet Take 40 mg by mouth daily.      . potassium chloride SA (K-DUR,KLOR-CON) 20 MEQ  tablet Take 40 mEq by mouth daily. May take extra tablet if you use metolazone.      . sertraline (ZOLOFT) 50 MG tablet Take 50 mg by mouth daily.       [name] spironolactone (ALDACTONE) 25 MG tablet Take 25 mg by mouth daily.       No current facility-administered medications for this visit.     Past Medical History  Diagnosis Date  . Chronic systolic heart failure     a.  secondary to severe ischemic cardiomyopathy with EF 20-25%;  b. s/p rimplantation of Biotronik single chamber ICD by Dr Ladona Ridgel 02/2008;  c.  Right heart cath 04/2007: RA 18, PA 56/30 (40), PCWP 36. FICK 3.89/2.06 Woods;  d. echo 9/09: Ef 20-25%, mild MR, mild LAE  . CAD (coronary artery disease)     a. s/p multiple PCIs; b. cath 10/07: LAD stent 40%, dx 80% ISR - tx with POBA; OM stent ok; ostial OM 60%; RCA occluded with L-R collats  . Tobacco abuse   . Alcohol abuse   . History of noncompliance with medical treatment   . Diabetes mellitus, type 2   . Dizziness   . HLD (hyperlipidemia)   . DVT (deep venous thrombosis)     coumadin  .  Arterial embolism     s/p right iliofemoral embolectomy with fasciotomy  . V-tach   . AICD (automatic cardioverter/defibrillator) present   . Angina   . Asthma   . Shortness of breath   . Sleep apnea   . CHF (congestive heart failure)   . GERD (gastroesophageal reflux disease)   . Anxiety   . Myocardial infarction   . COPD (chronic obstructive pulmonary disease)     ROS:   All systems reviewed and negative except as noted in the HPI.   Past Surgical History  Procedure Laterality Date  . Diagnostic laparoscopy    . Cardiac catheterization    . Insert / replace / remove pacemaker    . Tubal ligation       Family History  Problem Relation Age of Onset  . Coronary artery disease      FMILY HISTORY     History   Social History  . Marital Status: Divorced    Spouse Name: N/A    Number of Children: N/A  . Years of Education: N/A   Occupational History  . disabled     Social History Main Topics  . Smoking status: Current Some Day Smoker -- 0.25 packs/day for 21 years    Types: Cigarettes  . Smokeless tobacco: Never Used  . Alcohol Use: No  . Drug Use: No  . Sexually Active: No   Other Topics Concern  . Not on file   Social History Narrative  . No narrative on file     BP 106/77  Pulse 90  Ht 5\' 3"  (1.6 m)  Wt 147 lb 12.8 oz (67.042 kg)  BMI 26.19 kg/m2  Physical Exam:  stable appearing 52 year old woman,NAD HEENT: Unremarkable Neck:  7 cm JVD, no thyromegally Back:  No CVA tenderness Lungs:  Clear with no wheezes, rales, or rhonchi. HEART:  Regular rate rhythm, no murmurs, no rubs, no clicks Abd:  soft, positive bowel sounds, no organomegally, no rebound, no guarding Ext:  2 plus pulses, no edema, no cyanosis, no clubbing Skin:  No rashes no nodules Neuro:  CN II through XII intact, motor grossly intact  DEVICE  Normal device function.  See PaceArt for details.   Assess/Plan:

## 2012-07-05 NOTE — Assessment & Plan Note (Signed)
Her Biotronic ICD is working normally. We'll plan to recheck in several months.

## 2012-07-05 NOTE — Assessment & Plan Note (Signed)
Her chronic systolic heart failure is currently well compensated. She will continue her current medical therapy, and maintain a low-sodium diet. I've encouraged the patient to exercise regularly.

## 2012-07-23 ENCOUNTER — Ambulatory Visit (HOSPITAL_COMMUNITY)
Admission: RE | Admit: 2012-07-23 | Discharge: 2012-07-23 | Disposition: A | Discharge status: Home / Self Care | Payer: Medicare Other | Source: Ambulatory Visit | Attending: Internal Medicine | Admitting: Internal Medicine

## 2012-07-23 ENCOUNTER — Encounter (HOSPITAL_COMMUNITY): Payer: Self-pay

## 2012-07-23 VITALS — BP 110/64 | HR 83 | Ht 60.0 in | Wt 140.0 lb

## 2012-07-23 DIAGNOSIS — E785 Hyperlipidemia, unspecified: Secondary | ICD-10-CM | POA: Insufficient documentation

## 2012-07-23 DIAGNOSIS — E119 Type 2 diabetes mellitus without complications: Secondary | ICD-10-CM | POA: Insufficient documentation

## 2012-07-23 DIAGNOSIS — I2510 Atherosclerotic heart disease of native coronary artery without angina pectoris: Secondary | ICD-10-CM | POA: Insufficient documentation

## 2012-07-23 DIAGNOSIS — I5022 Chronic systolic (congestive) heart failure: Secondary | ICD-10-CM

## 2012-07-23 DIAGNOSIS — R05 Cough: Secondary | ICD-10-CM | POA: Insufficient documentation

## 2012-07-23 DIAGNOSIS — R059 Cough, unspecified: Secondary | ICD-10-CM | POA: Insufficient documentation

## 2012-07-23 NOTE — Assessment & Plan Note (Signed)
NYHA II. Volume status stable. Continue current regimen. Will not up titrate beta blocker due to hypotension at home. She is not on ACE-I due to severe cough or ARB due to low BP. Reinforced low salt food choices, limiting fluid intake, and medication compliance. Follow up in 3 months.

## 2012-07-23 NOTE — Patient Instructions (Addendum)
Follow up in 3-4 months  Do the following things EVERYDAY: 1) Weigh yourself in the morning before breakfast. Write it down and keep it in a log. 2) Take your medicines as prescribed 3) Eat low salt foods-Limit salt (sodium) to 2000 mg per day.  4) Stay as active as you can everyday 5) Limit all fluids for the day to less than 2 liters 

## 2012-07-23 NOTE — Progress Notes (Signed)
Patient ID: Jessica [name], female   DOB: 1960-10-25, 52 y.o.   MRN: [id number] PCP: Dr Dierdre Searles EP: Dr Ladona Ridgel  HPI: Jessica [name] is a 52 y.o. with a history of ICM, chronic systolic heart failure EF 25%,  CAD, S/P multiple interventions, ICD implantation, DM, , hyperlipidemia, former smoker (quit 2 months ago), S/P right iliofemoral embolectomy and compartment fasciotomy of the right leg by Dr. Darrick Penna with LLE DVT 06/2010.    She has inolerance of ACE-I due to severe cough.  Problem tolerating b-blocker and ARB due to low BP.    Cath 02/2011: showed severe 3 vessel CAD with occlusion of multiple stents, EF 10-15% Not a candidate for CABG. Discussion with patient about LVAD as an option, however patient declined.  Medical therapy continued. 02/2011 R heart Cath: RA = 18, RV = 67/10/20, PA = 56/30 (41), PCW = 23, Ao = 97/72 (83), LV = 93/20/30, Fick cardiac output/index = 2.8/1.7, PVR = 6.5 Woods  SVR = 1878, FA sat = 88%, PA sat = 42%, 46%,   ECHO 02/22/11: Only basal function preserved. EF 20-25%. ECHO 12/20/2011 EF 15%   Evaluated [facility] ED 07/05/12 for chest pain. CEs negative. Felt to be exhaustion.   She returns for follow up today.  Overall she is feeling great. Denies SOB/PND/Orthopnea.  Weight at home 136. She is taking 120 mg lasix daily. She on takes metoprolol as needed if her SBP > 160 because she develops hypotension. No lower extremity edema. Compliant with medications. Stopped smoking 2 months ago.    She has inolerance of ACE-I due to severe cough.  Problem tolerating b-blocker and ARB due to low BP.      ROS: All systems negative except as listed in HPI, PMH and Problem List.  Past Medical History  Diagnosis Date  . Chronic systolic heart failure     a.  secondary to severe ischemic cardiomyopathy with EF 20-25%;  b. s/p rimplantation of Biotronik single chamber ICD by Dr Ladona Ridgel 02/2008;  c.  Right heart cath 04/2007: RA 18, PA 56/30 (40), PCWP 36. FICK 3.89/2.06 Woods;  d. echo 9/09: Ef  20-25%, mild MR, mild LAE  . CAD (coronary artery disease)     a. s/p multiple PCIs; b. cath 10/07: LAD stent 40%, dx 80% ISR - tx with POBA; OM stent ok; ostial OM 60%; RCA occluded with L-R collats  . Tobacco abuse   . Alcohol abuse   . History of noncompliance with medical treatment   . Diabetes mellitus, type 2   . Dizziness   . HLD (hyperlipidemia)   . DVT (deep venous thrombosis)     coumadin  . Arterial embolism     s/p right iliofemoral embolectomy with fasciotomy  . V-tach   . AICD (automatic cardioverter/defibrillator) present   . Angina   . Asthma   . Shortness of breath   . Sleep apnea   . CHF (congestive heart failure)   . GERD (gastroesophageal reflux disease)   . Anxiety   . Myocardial infarction   . COPD (chronic obstructive pulmonary disease)     Current Outpatient Prescriptions  Medication Sig Dispense Refill  . diazepam (VALIUM) 5 MG tablet Take 5 mg by mouth at bedtime as needed for anxiety.      . digoxin (LANOXIN) 0.25 MG tablet Take 0.25 mg by mouth daily.      . furosemide (LASIX) 40 MG tablet Take 120 mg by mouth daily.       . insulin  glargine (LANTUS) 100 UNIT/ML injection Inject 10 Units into the skin as needed. Only takes if glucose> 200      . metoprolol succinate (TOPROL-XL) 25 MG 24 hr tablet Take 25 mg by mouth as needed. Only takes SBP> 160      . pantoprazole (PROTONIX) 40 MG tablet Take 40 mg by mouth daily.      . potassium chloride SA (K-DUR,KLOR-CON) 20 MEQ tablet Take 40 mEq by mouth daily. May take extra tablet if you use metolazone.      [name] spironolactone (ALDACTONE) 25 MG tablet Take 25 mg by mouth daily.      [name] albuterol (PROVENTIL HFA;VENTOLIN HFA) 108 (90 BASE) MCG/ACT inhaler Inhale 2 puffs into the lungs every 6 (six) hours as needed. For shortness of breath      . albuterol (PROVENTIL) (2.5 MG/3ML) 0.083% nebulizer solution Take 2.5 mg by nebulization every 4 (four) hours as needed. For shortness of breath      . metolazone  (ZAROXOLYN) 2.5 MG tablet Take 2.5 mg by mouth as needed (for fluid retention).      . nicotine (NICODERM CQ - DOSED IN MG/24 HOURS) 14 mg/24hr patch Place 1 patch onto the skin daily.      . nitroGLYCERIN (NITROSTAT) 0.4 MG SL tablet Place 0.4 mg under the tongue every 5 (five) minutes as needed for chest pain.      [name] sertraline (ZOLOFT) 50 MG tablet Take 50 mg by mouth as needed.        No current facility-administered medications for this encounter.     PHYSICAL EXAM: Filed Vitals:   07/23/12 1134  BP: 110/64  Pulse: 83  Height: 5' (1.524 m)  Weight: 140 lb (63.504 kg)  SpO2: 96%   General: Chronically ill appearing.  No resp difficulty HEENT:  normal Neck: supple. JVP 5-6.  Carotids 2+ bilaterally; no bruits. No lymphadenopathy or thryomegaly appreciated. Cor: PMI laterally displaced. Tachycardic rate & regular rhythm. soft  S3  Lungs: Clear Abdomen: obese, soft, nontender, nondistended.  No hepatosplenomegaly. No bruits or masses. Good bowel sounds. Extremities: no cyanosis, clubbing, rash, edema Neuro: alert & orientedx3, cranial nerves grossly intact. Moves all 4 extremities w/o difficulty. Affect pleasant.   ASSESSMENT & PLAN:

## 2012-08-21 NOTE — Progress Notes (Signed)
This encounter was created in error - please disregard.

## 2012-10-03 ENCOUNTER — Telehealth (HOSPITAL_COMMUNITY): Payer: Self-pay | Admitting: Adult Health

## 2012-10-03 NOTE — Telephone Encounter (Signed)
Danaye called regarding mild dizziness and bilateral leg pain.   Weight stable at 134 pounds. Denies SOB  Scheduled for follow up in HF clinic 10/06/12 at 8:20.   Jolicia Delira 2:09 PM

## 2012-10-06 ENCOUNTER — Encounter (HOSPITAL_COMMUNITY): Payer: Self-pay

## 2012-10-06 ENCOUNTER — Ambulatory Visit (INDEPENDENT_AMBULATORY_CARE_PROVIDER_SITE_OTHER): Payer: Medicare Other | Admitting: *Deleted

## 2012-10-06 ENCOUNTER — Encounter (INDEPENDENT_AMBULATORY_CARE_PROVIDER_SITE_OTHER): Payer: Medicare Other

## 2012-10-06 ENCOUNTER — Encounter: Payer: Self-pay | Admitting: Internal Medicine

## 2012-10-06 ENCOUNTER — Ambulatory Visit (HOSPITAL_COMMUNITY)
Admission: RE | Admit: 2012-10-06 | Discharge: 2012-10-06 | Disposition: A | Discharge status: Home / Self Care | Payer: Medicare Other | Source: Ambulatory Visit | Attending: Cardiology | Admitting: Cardiology

## 2012-10-06 ENCOUNTER — Telehealth (HOSPITAL_COMMUNITY): Payer: Self-pay | Admitting: Adult Health

## 2012-10-06 VITALS — BP 104/67 | HR 76 | Wt 137.8 lb

## 2012-10-06 DIAGNOSIS — Z794 Long term (current) use of insulin: Secondary | ICD-10-CM | POA: Insufficient documentation

## 2012-10-06 DIAGNOSIS — K219 Gastro-esophageal reflux disease without esophagitis: Secondary | ICD-10-CM | POA: Insufficient documentation

## 2012-10-06 DIAGNOSIS — I2510 Atherosclerotic heart disease of native coronary artery without angina pectoris: Secondary | ICD-10-CM

## 2012-10-06 DIAGNOSIS — J449 Chronic obstructive pulmonary disease, unspecified: Secondary | ICD-10-CM | POA: Insufficient documentation

## 2012-10-06 DIAGNOSIS — I472 Ventricular tachycardia: Secondary | ICD-10-CM

## 2012-10-06 DIAGNOSIS — Z95810 Presence of automatic (implantable) cardiac defibrillator: Secondary | ICD-10-CM | POA: Insufficient documentation

## 2012-10-06 DIAGNOSIS — I4729 Other ventricular tachycardia: Secondary | ICD-10-CM

## 2012-10-06 DIAGNOSIS — I739 Peripheral vascular disease, unspecified: Secondary | ICD-10-CM

## 2012-10-06 DIAGNOSIS — R42 Dizziness and giddiness: Secondary | ICD-10-CM

## 2012-10-06 DIAGNOSIS — Z87891 Personal history of nicotine dependence: Secondary | ICD-10-CM | POA: Insufficient documentation

## 2012-10-06 DIAGNOSIS — Z86718 Personal history of other venous thrombosis and embolism: Secondary | ICD-10-CM | POA: Insufficient documentation

## 2012-10-06 DIAGNOSIS — E119 Type 2 diabetes mellitus without complications: Secondary | ICD-10-CM | POA: Insufficient documentation

## 2012-10-06 DIAGNOSIS — I252 Old myocardial infarction: Secondary | ICD-10-CM | POA: Insufficient documentation

## 2012-10-06 DIAGNOSIS — F411 Generalized anxiety disorder: Secondary | ICD-10-CM | POA: Insufficient documentation

## 2012-10-06 DIAGNOSIS — I2589 Other forms of chronic ischemic heart disease: Secondary | ICD-10-CM | POA: Insufficient documentation

## 2012-10-06 DIAGNOSIS — I5022 Chronic systolic (congestive) heart failure: Secondary | ICD-10-CM

## 2012-10-06 DIAGNOSIS — E785 Hyperlipidemia, unspecified: Secondary | ICD-10-CM | POA: Insufficient documentation

## 2012-10-06 DIAGNOSIS — J4489 Other specified chronic obstructive pulmonary disease: Secondary | ICD-10-CM | POA: Insufficient documentation

## 2012-10-06 LAB — BASIC METABOLIC PANEL
Calcium: 9.6 mg/dL (ref 8.4–10.5)
GFR calc Af Amer: 75 mL/min — ABNORMAL LOW (ref >90)
GFR calc non Af Amer: 65 mL/min — ABNORMAL LOW (ref >90)
Sodium: 135 mEq/L (ref 135–145)

## 2012-10-06 MED ORDER — ASPIRIN EC 81 MG PO TBEC: 81.0000 mg | DELAYED_RELEASE_TABLET | Freq: Every day | ORAL | Status: DC

## 2012-10-06 NOTE — Telephone Encounter (Signed)
Potassium 3.2  Provided with lab results. Jessica [name] says she has only been taking 20 meq of potassium  Instructed to take  60 meq today and then take 40 meq daily.   Jessica [name] verbalized understanding.  Aniel Hubble 5:03 PM

## 2012-10-06 NOTE — Progress Notes (Signed)
Patient ID: Jessica [name], female   DOB: 04/26/1960, 52 y.o.   MRN: [id number] PCP: Dr Dierdre Searles EP: Dr Ladona Ridgel  HPI: Jessica [name] is a 52 y.o. with a history of ICM, chronic systolic heart failure EF 25%,  CAD, S/P multiple interventions, ICD implantation biotronic ICD, DM, , hyperlipidemia, former smoker (quit 2 months ago), S/P right iliofemoral embolectomy and compartment fasciotomy of the right leg by Dr. Darrick Penna with LLE DVT 06/2010.    She has inolerance of ACE-I due to severe cough.  Problem tolerating b-blocker and ARB due to low BP.    Cath 02/2011: showed severe 3 vessel CAD with occlusion of multiple stents, EF 10-15% Not a candidate for CABG. Discussion with patient about LVAD as an option, however patient declined.  Medical therapy continued.  02/2011 R heart Cath: RA = 18, RV = 67/10/20, PA = 56/30 (41), PCW = 23, Ao = 97/72 (83), LV = 93/20/30, Fick cardiac output/index = 2.8/1.7, PVR = 6.5 Woods  SVR = 1878, FA sat = 88%, PA sat = 42%, 46%,   ECHO 02/22/11: Only basal function preserved. EF 20-25%. ECHO 12/20/2011 EF 15%   Evaluated [facility] ED 07/05/12 for chest pain. CEs negative. Felt to be exhaustion.   She returns for follow up today.Says she a had a fainting episode in May 2014 while in [place]. Last week she was having severe foot pain but this has resolved. She has pain in her legs from time to time that is difficult for her to characterize.  Denies PND/Orthopnea. Does admit to dyspnea cleaning and sweeping.  + Bendopnea.  Weight at home 135-140 pounds.  She is taking 120 mg lasix daily. She only takes metoprolol as needed if her SBP > 120 because she develops hypotension. She has not had metoprolol in last 6 months. Compliant with medications. She has started swimming. Smokes cigars every now and then.    She has inolerance of ACE-I due to severe cough.  Problem tolerating b-blocker and ARB due to low BP.    Labs (4/14): K 3.5, creatinine 1.03  ROS: All systems negative except as listed  in HPI, PMH and Problem List.  Past Medical History  Diagnosis Date  . Chronic systolic heart failure     a.  secondary to severe ischemic cardiomyopathy with EF 20-25%;  b. s/p rimplantation of Biotronik single chamber ICD by Dr Ladona Ridgel 02/2008;  c.  Right heart cath 04/2007: RA 18, PA 56/30 (40), PCWP 36. FICK 3.89/2.06 Woods;  d. echo 9/09: Ef 20-25%, mild MR, mild LAE  . CAD (coronary artery disease)     a. s/p multiple PCIs; b. cath 10/07: LAD stent 40%, dx 80% ISR - tx with POBA; OM stent ok; ostial OM 60%; RCA occluded with L-R collats  . Tobacco abuse   . Alcohol abuse   . History of noncompliance with medical treatment   . Diabetes mellitus, type 2   . Dizziness   . HLD (hyperlipidemia)   . DVT (deep venous thrombosis)     coumadin  . Arterial embolism     s/p right iliofemoral embolectomy with fasciotomy  . V-tach   . AICD (automatic cardioverter/defibrillator) present   . Angina   . Asthma   . Shortness of breath   . Sleep apnea   . CHF (congestive heart failure)   . GERD (gastroesophageal reflux disease)   . Anxiety   . Myocardial infarction   . COPD (chronic obstructive pulmonary disease)     Current  Outpatient Prescriptions  Medication Sig Dispense Refill  . albuterol (PROVENTIL HFA;VENTOLIN HFA) 108 (90 BASE) MCG/ACT inhaler Inhale 2 puffs into the lungs every 6 (six) hours as needed. For shortness of breath      . albuterol (PROVENTIL) (2.5 MG/3ML) 0.083% nebulizer solution Take 2.5 mg by nebulization every 4 (four) hours as needed. For shortness of breath      . diazepam (VALIUM) 5 MG tablet Take 5 mg by mouth at bedtime as needed for anxiety.      . digoxin (LANOXIN) 0.25 MG tablet Take 0.25 mg by mouth daily.      . furosemide (LASIX) 40 MG tablet Take 120 mg by mouth daily.       . insulin glargine (LANTUS) 100 UNIT/ML injection Inject 10 Units into the skin as needed. Only takes if glucose> 200      . metolazone (ZAROXOLYN) 2.5 MG tablet Take 2.5 mg by mouth  as needed (for fluid retention).      . metoprolol succinate (TOPROL-XL) 25 MG 24 hr tablet Take 25 mg by mouth as needed. Only takes SBP> 160      . nitroGLYCERIN (NITROSTAT) 0.4 MG SL tablet Place 0.4 mg under the tongue every 5 (five) minutes as needed for chest pain.      . potassium chloride SA (K-DUR,KLOR-CON) 20 MEQ tablet Take 40 mEq by mouth daily. May take extra tablet if you use metolazone.      . sertraline (ZOLOFT) 50 MG tablet Take 50 mg by mouth as needed.       [name] spironolactone (ALDACTONE) 25 MG tablet Take 25 mg by mouth daily.      . pantoprazole (PROTONIX) 40 MG tablet Take 40 mg by mouth daily.       No current facility-administered medications for this encounter.     PHYSICAL EXAM: Filed Vitals:   10/06/12 0950  BP: 104/67  Pulse: 76  Weight: 137 lb 12.8 oz (62.506 kg)  SpO2: 100%   General: Chronically ill appearing.  No resp difficulty HEENT:  normal Neck: supple. JVP 8 cm.  Carotids 2+ bilaterally; no bruits. No lymphadenopathy or thryomegaly appreciated. Cor: PMI laterally displaced. Tachycardic rate & regular rhythm. soft  S3.  I am unable to palpate peripheral pulses.  Lungs: Clear Abdomen: obese, soft, nontender, nondistended.  No hepatosplenomegaly. No bruits or masses. Good bowel sounds. Extremities: no cyanosis, clubbing, rash, edema Neuro: alert & orientedx3, cranial nerves grossly intact. Moves all 4 extremities w/o difficulty. Affect pleasant.   ASSESSMENT & PLAN: 1. Chronic Systolic Heart Failure- ECHO 12/2011 EF 15% Ischemic cardiomyopathy, last echo with EF 15%.  NYHA IIIb. Volume status stable. Continue 120 mg of lasix daily/25 mg Spironolactone daily and Metolazone as needed.  Continue digoxin 0.25 mg daily.  She is intolerant metoprolol due to hypotension and intolerant Ace due to cough.  Reinforced medication compliance.  2. 3 vessel CAD with occlusion of multiple stents No chest pain. Add 81 mg aspirin. She is intolerant 325 mg aspirin due  to nose bleeds. She is not on a statin and is not interested in taking one.  3. Presyncope Biotronik ICD interrogated. No events noted.  4. PAD Leg pain, difficult to characterize.  Unable to palpate peripheral pulses.  Check ABIs.   [name] 10/06/2012

## 2012-10-06 NOTE — Patient Instructions (Addendum)
Please start an Aspirin 81 mg a day Continue all other medications as listed.  Your physician has requested that you have a lower extremity arterial exercise duplex. During this test, exercise and ultrasound are used to evaluate arterial blood flow in the legs. Allow one hour for this exam. There are no restrictions or special instructions.  Follow up in 3 months with Congestive Heart Failure.   Do the following things EVERYDAY: 1) Weigh yourself in the morning before breakfast. Write it down and keep it in a log. 2) Take your medicines as prescribed 3) Eat low salt foods-Limit salt (sodium) to 2000 mg per day.  4) Stay as active as you can everyday 5) Limit all fluids for the day to less than 2 liters

## 2012-10-08 LAB — REMOTE ICD DEVICE
BATTERY VOLTAGE: 3.1 V
DEVICE MODEL ICD: 60439356
HV IMPEDENCE: 48 Ohm
RV LEAD THRESHOLD: 0.5 V
VENTRICULAR PACING ICD: 0 pct

## 2012-10-10 ENCOUNTER — Telehealth (HOSPITAL_COMMUNITY): Payer: Self-pay | Admitting: *Deleted

## 2012-10-10 ENCOUNTER — Emergency Department (HOSPITAL_COMMUNITY): Payer: Medicare Other

## 2012-10-10 ENCOUNTER — Encounter (HOSPITAL_COMMUNITY): Payer: Self-pay | Admitting: Emergency Medicine

## 2012-10-10 ENCOUNTER — Telehealth: Payer: Self-pay | Admitting: Internal Medicine

## 2012-10-10 ENCOUNTER — Emergency Department (HOSPITAL_COMMUNITY)
Admission: EM | Admit: 2012-10-10 | Discharge: 2012-10-10 | Disposition: A | Discharge status: Home / Self Care | Payer: Medicare Other | Attending: Emergency Medicine | Admitting: Emergency Medicine

## 2012-10-10 DIAGNOSIS — J45901 Unspecified asthma with (acute) exacerbation: Secondary | ICD-10-CM | POA: Insufficient documentation

## 2012-10-10 DIAGNOSIS — Z86718 Personal history of other venous thrombosis and embolism: Secondary | ICD-10-CM | POA: Insufficient documentation

## 2012-10-10 DIAGNOSIS — R5383 Other fatigue: Secondary | ICD-10-CM | POA: Insufficient documentation

## 2012-10-10 DIAGNOSIS — Z79899 Other long term (current) drug therapy: Secondary | ICD-10-CM | POA: Insufficient documentation

## 2012-10-10 DIAGNOSIS — Z4502 Encounter for adjustment and management of automatic implantable cardiac defibrillator: Secondary | ICD-10-CM

## 2012-10-10 DIAGNOSIS — Y831 Surgical operation with implant of artificial internal device as the cause of abnormal reaction of the patient, or of later complication, without mention of misadventure at the time of the procedure: Secondary | ICD-10-CM | POA: Insufficient documentation

## 2012-10-10 DIAGNOSIS — I2510 Atherosclerotic heart disease of native coronary artery without angina pectoris: Secondary | ICD-10-CM | POA: Insufficient documentation

## 2012-10-10 DIAGNOSIS — F411 Generalized anxiety disorder: Secondary | ICD-10-CM | POA: Insufficient documentation

## 2012-10-10 DIAGNOSIS — R5381 Other malaise: Secondary | ICD-10-CM | POA: Insufficient documentation

## 2012-10-10 DIAGNOSIS — Z9861 Coronary angioplasty status: Secondary | ICD-10-CM | POA: Insufficient documentation

## 2012-10-10 DIAGNOSIS — I5022 Chronic systolic (congestive) heart failure: Secondary | ICD-10-CM | POA: Insufficient documentation

## 2012-10-10 DIAGNOSIS — I252 Old myocardial infarction: Secondary | ICD-10-CM | POA: Insufficient documentation

## 2012-10-10 DIAGNOSIS — Z95810 Presence of automatic (implantable) cardiac defibrillator: Secondary | ICD-10-CM | POA: Insufficient documentation

## 2012-10-10 DIAGNOSIS — J441 Chronic obstructive pulmonary disease with (acute) exacerbation: Secondary | ICD-10-CM | POA: Insufficient documentation

## 2012-10-10 DIAGNOSIS — Z794 Long term (current) use of insulin: Secondary | ICD-10-CM | POA: Insufficient documentation

## 2012-10-10 DIAGNOSIS — R42 Dizziness and giddiness: Secondary | ICD-10-CM | POA: Insufficient documentation

## 2012-10-10 DIAGNOSIS — E119 Type 2 diabetes mellitus without complications: Secondary | ICD-10-CM | POA: Insufficient documentation

## 2012-10-10 DIAGNOSIS — Z7982 Long term (current) use of aspirin: Secondary | ICD-10-CM | POA: Insufficient documentation

## 2012-10-10 DIAGNOSIS — Z8719 Personal history of other diseases of the digestive system: Secondary | ICD-10-CM | POA: Insufficient documentation

## 2012-10-10 DIAGNOSIS — Z8679 Personal history of other diseases of the circulatory system: Secondary | ICD-10-CM | POA: Insufficient documentation

## 2012-10-10 DIAGNOSIS — T82198A Other mechanical complication of other cardiac electronic device, initial encounter: Secondary | ICD-10-CM | POA: Insufficient documentation

## 2012-10-10 DIAGNOSIS — F17200 Nicotine dependence, unspecified, uncomplicated: Secondary | ICD-10-CM | POA: Insufficient documentation

## 2012-10-10 DIAGNOSIS — Z91040 Latex allergy status: Secondary | ICD-10-CM | POA: Insufficient documentation

## 2012-10-10 LAB — CBC
MCHC: 35.2 g/dL (ref 30.0–36.0)
MCV: 82.2 fL (ref 78.0–100.0)
Platelets: 168 10*3/uL (ref 150–400)
RDW: 16 % — ABNORMAL HIGH (ref 11.5–15.5)
WBC: 5.7 10*3/uL (ref 4.0–10.5)

## 2012-10-10 LAB — BASIC METABOLIC PANEL
BUN: 23 mg/dL (ref 6–23)
Chloride: 96 mEq/L (ref 96–112)
Creatinine, Ser: 0.96 mg/dL (ref 0.50–1.10)
GFR calc Af Amer: 76 mL/min — ABNORMAL LOW (ref >90)
GFR calc non Af Amer: 66 mL/min — ABNORMAL LOW (ref >90)

## 2012-10-10 LAB — POCT I-STAT TROPONIN I

## 2012-10-10 LAB — PROTIME-INR: Prothrombin Time: 14 seconds (ref 11.6–15.2)

## 2012-10-10 NOTE — Telephone Encounter (Signed)
Pt called stating that her defibrillator shocked her and instructed the pt to go to the ER, per Amy Clegg.

## 2012-10-10 NOTE — Telephone Encounter (Signed)
Pt called in regarding shock from earlier today. Transmission showed 2 episodes---one with shock and one with noise. Dr Ladona Ridgel evaluated both episodes. Pt was scheduled for 10-13-12 @ 1130am for device check. Try to reproduce noise and make sure NID's are extended out. Pt is aware no driving x 6 mths. Pt also instructed to call 911 if device discharges over weekend. Also instructed to make sure all meds are taken.

## 2012-10-10 NOTE — ED Notes (Signed)
Pt sts shocked by defib today; pt sts generalized weakness but denies pain or SOB

## 2012-10-10 NOTE — ED Provider Notes (Signed)
CSN: [id number]     Arrival date & time 10/10/12  1726 History     None    Chief Complaint  Patient presents with  . AICD Problem   HPI 52 year old female with severe Ischemic cardiomyopathy s/p AICD (EF 15%)  presents to the ED after receiving defibrillation today.  Patient reports that this afternoon she received a shock (around 1pm).  She contacted [facility] and was initially told to come to the ED.   Patient was later told by Dr. Ladona Ridgel (after he interrogated the AICD) that the defibrillation was appropriate.  Patient subsequently present to the ED for evaluation.  She reports some weakness and dizziness currently. No active chest pain or SOB currently.  She reports compliance with her medications.  Denies any other issues currently.   Past Medical History  Diagnosis Date  . Chronic systolic heart failure     a.  secondary to severe ischemic cardiomyopathy with EF 20-25%;  b. s/p rimplantation of Biotronik single chamber ICD by Dr Ladona Ridgel 02/2008;  c.  Right heart cath 04/2007: RA 18, PA 56/30 (40), PCWP 36. FICK 3.89/2.06 Woods;  d. echo 9/09: Ef 20-25%, mild MR, mild LAE  . CAD (coronary artery disease)     a. s/p multiple PCIs; b. cath 10/07: LAD stent 40%, dx 80% ISR - tx with POBA; OM stent ok; ostial OM 60%; RCA occluded with L-R collats  . Tobacco abuse   . Alcohol abuse   . History of noncompliance with medical treatment   . Diabetes mellitus, type 2   . Dizziness   . HLD (hyperlipidemia)   . DVT (deep venous thrombosis)     coumadin  . Arterial embolism     s/p right iliofemoral embolectomy with fasciotomy  . V-tach   . AICD (automatic cardioverter/defibrillator) present   . Angina   . Asthma   . Shortness of breath   . Sleep apnea   . CHF (congestive heart failure)   . GERD (gastroesophageal reflux disease)   . Anxiety   . Myocardial infarction   . COPD (chronic obstructive pulmonary disease)    Past Surgical History  Procedure Laterality Date  .  Diagnostic laparoscopy    . Cardiac catheterization    . Insert / replace / remove pacemaker    . Tubal ligation     Family History  Problem Relation Age of Onset  . Coronary artery disease      FMILY HISTORY   History  Substance Use Topics  . Smoking status: Current Some Day Smoker -- 0.25 packs/day for 21 years    Types: Cigarettes  . Smokeless tobacco: Never Used  . Alcohol Use: No   OB History   Grav Para Term Preterm Abortions TAB SAB Ect Mult Living                 Review of Systems  Constitutional: Positive for fatigue. Negative for fever.  Respiratory: Negative for chest tightness and shortness of breath.   Cardiovascular: Negative for chest pain and leg swelling.  Gastrointestinal: Negative for nausea, vomiting and abdominal pain.  Neurological: Positive for dizziness and light-headedness.    Allergies  Latex and Ramipril  Home Medications   Current Outpatient Rx  Name  Route  Sig  Dispense  Refill  . diazepam (VALIUM) 5 MG tablet   Oral   Take 5 mg by mouth at bedtime as needed for anxiety.         . digoxin (  LANOXIN) 0.25 MG tablet   Oral   Take 0.25 mg by mouth daily.         . furosemide (LASIX) 40 MG tablet   Oral   Take 120 mg by mouth daily.          . insulin glargine (LANTUS) 100 UNIT/ML injection   Subcutaneous   Inject 10 Units into the skin as needed. Only takes if glucose> 200         . metolazone (ZAROXOLYN) 2.5 MG tablet   Oral   Take 2.5 mg by mouth as needed (for fluid retention).         . metoprolol succinate (TOPROL-XL) 25 MG 24 hr tablet   Oral   Take 25 mg by mouth as needed. Only takes SBP> 160         . naproxen sodium (ANAPROX) 220 MG tablet   Oral   Take 220 mg by mouth daily as needed (headache).         . nitroGLYCERIN (NITROSTAT) 0.4 MG SL tablet   Sublingual   Place 0.4 mg under the tongue every 5 (five) minutes as needed for chest pain.         . potassium chloride SA (K-DUR,KLOR-CON) 20 MEQ  tablet   Oral   Take 40 mEq by mouth daily. May take extra tablet if you use metolazone.         . sertraline (ZOLOFT) 50 MG tablet   Oral   Take 50 mg by mouth as needed (depression).          [name] spironolactone (ALDACTONE) 25 MG tablet   Oral   Take 25 mg by mouth daily.         [name] albuterol (PROVENTIL HFA;VENTOLIN HFA) 108 (90 BASE) MCG/ACT inhaler   Inhalation   Inhale 2 puffs into the lungs every 6 (six) hours as needed. For shortness of breath         . albuterol (PROVENTIL) (2.5 MG/3ML) 0.083% nebulizer solution   Nebulization   Take 2.5 mg by nebulization every 4 (four) hours as needed. For shortness of breath         . aspirin EC 81 MG tablet   Oral   Take 1 tablet (81 mg total) by mouth daily.          BP 100/66  Pulse 88  Temp(Src) 98.2 F (36.8 C) (Oral)  Resp 16  SpO2 98% Physical Exam  Constitutional: She is oriented to person, place, and time. She appears well-developed and well-nourished. No distress.  HENT:  Head: Normocephalic and atraumatic.  Neck: Neck supple.  Cardiovascular: Regular rhythm and normal heart sounds.   No murmur heard. Pulmonary/Chest: Effort normal and breath sounds normal. No respiratory distress. She has no wheezes. She has no rales.  Abdominal: Soft. She exhibits no distension. There is no tenderness.  Musculoskeletal: She exhibits no edema.  Neurological: She is alert and oriented to person, place, and time.  Skin: Skin is warm and dry.    ED Course   Procedures (including critical care time)  CBC    Component Value Date/Time   WBC 5.7 10/10/2012 1914   RBC 5.11 10/10/2012 1914   RBC 4.41 12/21/2011 0830   HGB 14.8 10/10/2012 1914   HCT 42.0 10/10/2012 1914   PLT 168 10/10/2012 1914   MCV 82.2 10/10/2012 1914   MCH 29.0 10/10/2012 1914   MCHC 35.2 10/10/2012 1914   RDW 16.0* 10/10/2012 1914   LYMPHSABS 1.0  12/20/2011 1605   MONOABS 0.4 12/20/2011 1605   EOSABS 0.0 12/20/2011 1605   BASOSABS 0.0 12/20/2011 1605   BMET     Component Value Date/Time   NA 136 10/10/2012 1914   K 3.5 10/10/2012 1914   CL 96 10/10/2012 1914   CO2 31 10/10/2012 1914   GLUCOSE 178* 10/10/2012 1914   BUN 23 10/10/2012 1914   CREATININE 0.96 10/10/2012 1914   CREATININE 0.91 08/25/2010 1517   CALCIUM 9.2 10/10/2012 1914   GFRNONAA 66* 10/10/2012 1914   GFRAA 76* 10/10/2012 1914   Dg Chest 2 View  10/10/2012   *RADIOLOGY REPORT*  Clinical Data: Problems with the indwelling pacing defibrillator. Current history of hypertension, diabetes.  Smoker with current history of COPD.  CHEST - 2 VIEW  Comparison: Two-view chest x-ray 07/03/2012, 12/15/2010.  Findings: Cardiac silhouette markedly enlarged but stable.  Left subclavian pacing defibrillator unchanged and appears intact.  Mild pulmonary venous hypertension without overt edema, unchanged. Prominent bronchovascular markings diffusely and mild central peribronchial thickening, unchanged.  No new pulmonary parenchymal abnormality.  No pleural effusions.  Visualized bony thorax intact.  IMPRESSION: Stable marked cardiomegaly without pulmonary edema.  COPD (chronic bronchitis).  No acute cardiopulmonary disease.   Original Report Authenticated By: Hulan Saas, M.D.   No diagnosis found.  MDM  52 year old female with severe Ischemic cardiomyopathy s/p AICD (EF 15%)  presents to the ED after receiving defibrillation today. - Patient states AICD was interrogated by Dr. Ladona Ridgel and that defib was appropriate - Will obtain basic labs - CBC, BMP.  Patient noted to have recent hypokalemia - EKG obtained and revealed prolonged QT and non specific ST-T wave changes.  2000 - CBC and BMP complete.  BMP revealed K of 3.5.  Patient advised to increase K to 80 mEq daily.  Will discharge home.  Patient to follow up with Dr. Ladona Ridgel on Monday.  Everlene Other DO Family Medicine PGY-2     Tommie Sams, DO 10/10/12 2000

## 2012-10-10 NOTE — Telephone Encounter (Signed)
New Problem     Patient received a ticket for not wearing a seatbelt. Patient is requesting a letter for court because she refuses to wear a seatbelt because it irritates the defib. Court date is on 10/17/2012

## 2012-10-10 NOTE — Telephone Encounter (Deleted)
error 

## 2012-10-11 NOTE — ED Provider Notes (Signed)
I saw and evaluated the patient, reviewed the resident's note and I agree with the findings and plan.   .Face to face Exam:  General:  Awake HEENT:  Atraumatic Resp:  Normal effort Abd:  Nondistended Neuro:No focal weakness  Nelia Shi, MD 10/11/12 1115

## 2012-10-13 ENCOUNTER — Encounter: Payer: Self-pay | Admitting: Internal Medicine

## 2012-10-13 ENCOUNTER — Ambulatory Visit (INDEPENDENT_AMBULATORY_CARE_PROVIDER_SITE_OTHER): Payer: Medicare Other | Admitting: *Deleted

## 2012-10-13 DIAGNOSIS — I472 Ventricular tachycardia, unspecified: Secondary | ICD-10-CM

## 2012-10-13 LAB — ICD DEVICE OBSERVATION
BRDY-0002RV: 45 {beats}/min
CHARGE TIME: 10.9 s
DEV-0020ICD: NEGATIVE
HV IMPEDENCE: 46 Ohm
RV LEAD AMPLITUDE: 16.4 mv
RV LEAD IMPEDENCE ICD: 509 Ohm

## 2012-10-13 NOTE — Progress Notes (Signed)
ICD check in clinic. Pt received shock on 10-10-12 while sleeping. Episode was appropriately shock.  Second episode had some noise on ventricular episode. Isometric exercises had little noise.  Follow up as planned.

## 2012-10-17 ENCOUNTER — Encounter (HOSPITAL_COMMUNITY): Payer: Medicare Other

## 2012-10-29 ENCOUNTER — Encounter (HOSPITAL_COMMUNITY): Payer: Medicare Other

## 2012-11-12 ENCOUNTER — Other Ambulatory Visit (HOSPITAL_COMMUNITY): Payer: Self-pay | Admitting: Internal Medicine

## 2012-12-12 ENCOUNTER — Other Ambulatory Visit (HOSPITAL_COMMUNITY): Payer: Self-pay | Admitting: Cardiology

## 2012-12-12 DIAGNOSIS — F341 Dysthymic disorder: Secondary | ICD-10-CM

## 2012-12-12 MED ORDER — DIAZEPAM 5 MG PO TABS: 5.0000 mg | ORAL_TABLET | Freq: Every evening | ORAL | Status: DC | PRN

## 2012-12-12 NOTE — Telephone Encounter (Signed)
Pt called with c/o chest pains off and on for the past 24-48 hrs. pai does radiate down arm Pt states these symptoms have happened before in the past when her anxiety level is raised. Pt is currently under a lot of stress  Per vo Ulla Potash, NP pk to call in # 5 valium pt must keep her appointment And we will arrange for her to follow up with a PCP for further management of her anxiety

## 2012-12-15 MED ORDER — DIAZEPAM 5 MG PO TABS: 5.0000 mg | ORAL_TABLET | Freq: Every evening | ORAL | Status: DC | PRN

## 2012-12-15 NOTE — Addendum Note (Signed)
Addended by: Theresia Bough on: 12/15/2012 10:14 AM   Modules accepted: Orders

## 2013-01-06 ENCOUNTER — Ambulatory Visit (HOSPITAL_COMMUNITY)
Admission: RE | Admit: 2013-01-06 | Discharge: 2013-01-06 | Disposition: A | Discharge status: Home / Self Care | Payer: Medicare Other | Source: Ambulatory Visit | Attending: Internal Medicine | Admitting: Internal Medicine

## 2013-01-06 VITALS — BP 102/70 | HR 90 | Wt 139.0 lb

## 2013-01-06 DIAGNOSIS — I2510 Atherosclerotic heart disease of native coronary artery without angina pectoris: Secondary | ICD-10-CM

## 2013-01-06 DIAGNOSIS — I5022 Chronic systolic (congestive) heart failure: Secondary | ICD-10-CM | POA: Insufficient documentation

## 2013-01-06 DIAGNOSIS — I5023 Acute on chronic systolic (congestive) heart failure: Secondary | ICD-10-CM

## 2013-01-06 LAB — DIGOXIN LEVEL: Digoxin Level: 0.5 ng/mL — ABNORMAL LOW (ref 0.8–2.0)

## 2013-01-06 LAB — BASIC METABOLIC PANEL
BUN: 23 mg/dL (ref 6–23)
Chloride: 96 mEq/L (ref 96–112)
GFR calc Af Amer: 72 mL/min — ABNORMAL LOW (ref >90)
GFR calc non Af Amer: 62 mL/min — ABNORMAL LOW (ref >90)
Potassium: 4 mEq/L (ref 3.5–5.1)
Sodium: 137 mEq/L (ref 135–145)

## 2013-01-06 LAB — PRO B NATRIURETIC PEPTIDE: Pro B Natriuretic peptide (BNP): 2123 pg/mL — ABNORMAL HIGH (ref 0–125)

## 2013-01-06 MED ORDER — METOLAZONE 2.5 MG PO TABS: 2.5000 mg | ORAL_TABLET | ORAL | Status: DC | PRN

## 2013-01-06 NOTE — Progress Notes (Signed)
Patient ID: Jessica [name], female   DOB: Jul 31, 1960, 52 y.o.   MRN: [id number] PCP: Dr Dierdre Searles EP: Dr Ladona Ridgel  HPI: Jessica [name] is a 52 y.o. with a history of ICM, chronic systolic heart failure EF 25%,  CAD, S/P multiple interventions, ICD implantation, DM, , hyperlipidemia, former smoker (quit 2 months ago), S/P right iliofemoral embolectomy and compartment fasciotomy of the right leg by Dr. Darrick Penna with LLE DVT 06/2010.    Cath 02/2011: showed severe 3 vessel CAD with occlusion of multiple stents, EF 10-15% Not a candidate for CABG. Discussion with patient about LVAD as an option, however patient declined.  Medical therapy continued. 02/2011 R heart Cath: RA = 18, RV = 67/10/20, PA = 56/30 (41), PCW = 23, Ao = 97/72 (83), LV = 93/20/30, Fick cardiac output/index = 2.8/1.7, PVR = 6.5 Woods  SVR = 1878, FA sat = 88%, PA sat = 42%, 46%,   ECHO 02/22/11: Only basal function preserved. EF 20-25%. ECHO 12/20/2011 EF 15%   Evaluated [facility] ED 07/05/12 for chest pain. CEs negative. Felt to be exhaustion.   She returns for follow up today. Overall feeling better. Denies SOB/PND/OrthopneaCP with dail activities. Says she gets SOB in cold weather or with moderate exertion. Weight at home 134-136 pounds. She is taking metolazone 2 times a month. She has not had any Toprol XL in 1 year. SBP at home 110. Taking all medications. Smokes a cigar every now and then.   She has inolerance of ACE-I due to severe cough.  Problem tolerating b-blocker and ARB due to low BP.    ROS: All systems negative except as listed in HPI, PMH and Problem List.  Past Medical History  Diagnosis Date  . Chronic systolic heart failure     a.  secondary to severe ischemic cardiomyopathy with EF 20-25%;  b. s/p rimplantation of Biotronik single chamber ICD by Dr Ladona Ridgel 02/2008;  c.  Right heart cath 04/2007: RA 18, PA 56/30 (40), PCWP 36. FICK 3.89/2.06 Woods;  d. echo 9/09: Ef 20-25%, mild MR, mild LAE  . CAD (coronary artery disease)     a.  s/p multiple PCIs; b. cath 10/07: LAD stent 40%, dx 80% ISR - tx with POBA; OM stent ok; ostial OM 60%; RCA occluded with L-R collats  . Tobacco abuse   . Alcohol abuse   . History of noncompliance with medical treatment   . Diabetes mellitus, type 2   . Dizziness   . HLD (hyperlipidemia)   . DVT (deep venous thrombosis)     coumadin  . Arterial embolism     s/p right iliofemoral embolectomy with fasciotomy  . V-tach   . AICD (automatic cardioverter/defibrillator) present   . Angina   . Asthma   . Shortness of breath   . Sleep apnea   . CHF (congestive heart failure)   . GERD (gastroesophageal reflux disease)   . Anxiety   . Myocardial infarction   . COPD (chronic obstructive pulmonary disease)     Current Outpatient Prescriptions  Medication Sig Dispense Refill  . albuterol (PROVENTIL HFA;VENTOLIN HFA) 108 (90 BASE) MCG/ACT inhaler Inhale 2 puffs into the lungs every 6 (six) hours as needed. For shortness of breath      . albuterol (PROVENTIL) (2.5 MG/3ML) 0.083% nebulizer solution Take 2.5 mg by nebulization every 4 (four) hours as needed. For shortness of breath      . aspirin EC 81 MG tablet Take 1 tablet (81 mg total) by mouth daily.      [name]  diazepam (VALIUM) 5 MG tablet Take 1 tablet (5 mg total) by mouth at bedtime as needed for anxiety.  5 tablet  0  . digoxin (LANOXIN) 0.25 MG tablet Take 0.25 mg by mouth daily.      . furosemide (LASIX) 40 MG tablet Take 120 mg by mouth daily.       . insulin glargine (LANTUS) 100 UNIT/ML injection Inject 10 Units into the skin as needed. Only takes if glucose> 200      . metolazone (ZAROXOLYN) 2.5 MG tablet TAKE 1 TABLET BY MOUTH DAILY AS NEEDED  10 tablet  2  . metoprolol succinate (TOPROL-XL) 25 MG 24 hr tablet Take 25 mg by mouth as needed. Only takes SBP> 160      . naproxen sodium (ANAPROX) 220 MG tablet Take 220 mg by mouth daily as needed (headache).      . potassium chloride SA (K-DUR,KLOR-CON) 20 MEQ tablet Take 40 mEq by mouth  daily. May take extra tablet if you use metolazone.      . sertraline (ZOLOFT) 50 MG tablet Take 50 mg by mouth as needed (depression).       [name] spironolactone (ALDACTONE) 25 MG tablet Take 25 mg by mouth daily.      . nitroGLYCERIN (NITROSTAT) 0.4 MG SL tablet Place 0.4 mg under the tongue every 5 (five) minutes as needed for chest pain.       No current facility-administered medications for this encounter.     PHYSICAL EXAM: Filed Vitals:   01/06/13 1053  BP: 102/70  Pulse: 90  Weight: 139 lb (63.05 kg)  SpO2: 95%   General: Chronically ill appearing.  No resp difficulty HEENT:  normal Neck: supple. JVP 5-6.  Carotids 2+ bilaterally; no bruits. No lymphadenopathy or thryomegaly appreciated. Cor: PMI laterally displaced. Tachycardic rate & regular rhythm. soft  S3  Lungs: Clear Abdomen: obese, soft, nontender, nondistended.  No hepatosplenomegaly. No bruits or masses. Good bowel sounds. Extremities: no cyanosis, clubbing, rash, edema Neuro: alert & orientedx3, cranial nerves grossly intact. Moves all 4 extremities w/o difficulty. Affect pleasant.   ASSESSMENT & PLAN: 1. Chronic systolic heart failure  ECHO 12/20/2011 EF 15%,  S/P Biotronic ICD 2009  Volume status Continue lasix 120 mg daily and spironolactone 25 mg daily  Continue Toprol 25 mg daily as needed for SBP> 160 she is intolerant daily use Continue digoxin 0.25 mg daily  Intolerant Ace/Arb due to hypotension and dizziness Check pro BNP, dig level and BMET. Check ECHO   2. CAD, S/P multiple interventions No evidence of ischemia . Continue baby aspirin.  3. Hyperlipidemia- intolerant statin due myalgia. Says she will consider crestor Check fasting lipids at next visit.   4. Former smoker- Declines smoking cessation.   5.DM - refer back to Dr Dierdre Searles  Follow up in 3 months  [name],Jessica  Patient seen and examined with [name], NP. We discussed all aspects of the encounter. I agree with the assessment and plan as  stated above. She is doing surprisingly well despite severe CAD and LV dysfunction. Volume status looks ok. Stressed need to be complaint with her meds as BP tolerates. Refuses statin and smoking cessation.  Will schedule f/u echo. Reinforced need for daily weights and reviewed use of sliding scale diuretics.

## 2013-01-06 NOTE — Progress Notes (Signed)
Patient had one office visit on 07/03/12 to establish the care, and she came with chest pain and was sent to the ED.  She has not followed up with the clinic. Will ask the front desk to call her for a follow up appt to establish the care.  Thanks  Dr. Dierdre Searles

## 2013-01-06 NOTE — Patient Instructions (Signed)
Follow up in 3 months with an ECHO  Do the following things EVERYDAY: 1) Weigh yourself in the morning before breakfast. Write it down and keep it in a log. 2) Take your medicines as prescribed 3) Eat low salt foods-Limit salt (sodium) to 2000 mg per day.  4) Stay as active as you can everyday 5) Limit all fluids for the day to less than 2 liters 

## 2013-01-08 ENCOUNTER — Encounter: Payer: Self-pay | Admitting: Internal Medicine

## 2013-01-08 ENCOUNTER — Ambulatory Visit (INDEPENDENT_AMBULATORY_CARE_PROVIDER_SITE_OTHER): Payer: Medicare Other | Admitting: *Deleted

## 2013-01-08 DIAGNOSIS — I472 Ventricular tachycardia, unspecified: Secondary | ICD-10-CM

## 2013-01-08 DIAGNOSIS — Z95810 Presence of automatic (implantable) cardiac defibrillator: Secondary | ICD-10-CM

## 2013-01-12 LAB — REMOTE ICD DEVICE
BRDY-0002RV: 45 {beats}/min
DEVICE MODEL ICD: 60439356
HV IMPEDENCE: 45 Ohm
RV LEAD AMPLITUDE: 13.2 mv
VENTRICULAR PACING ICD: 0 pct

## 2013-01-21 ENCOUNTER — Encounter: Payer: Self-pay | Admitting: Internal Medicine

## 2013-03-13 ENCOUNTER — Emergency Department (HOSPITAL_COMMUNITY): Payer: Medicare Other

## 2013-03-13 ENCOUNTER — Encounter (HOSPITAL_COMMUNITY): Payer: Self-pay | Admitting: Emergency Medicine

## 2013-03-13 ENCOUNTER — Emergency Department (HOSPITAL_COMMUNITY)
Admission: EM | Admit: 2013-03-13 | Discharge: 2013-03-13 | Disposition: A | Discharge status: Home / Self Care | Payer: Medicare Other | Attending: Emergency Medicine | Admitting: Emergency Medicine

## 2013-03-13 DIAGNOSIS — Z7982 Long term (current) use of aspirin: Secondary | ICD-10-CM | POA: Insufficient documentation

## 2013-03-13 DIAGNOSIS — I252 Old myocardial infarction: Secondary | ICD-10-CM | POA: Insufficient documentation

## 2013-03-13 DIAGNOSIS — I2510 Atherosclerotic heart disease of native coronary artery without angina pectoris: Secondary | ICD-10-CM | POA: Insufficient documentation

## 2013-03-13 DIAGNOSIS — Z79899 Other long term (current) drug therapy: Secondary | ICD-10-CM | POA: Insufficient documentation

## 2013-03-13 DIAGNOSIS — I5022 Chronic systolic (congestive) heart failure: Secondary | ICD-10-CM | POA: Insufficient documentation

## 2013-03-13 DIAGNOSIS — Z8639 Personal history of other endocrine, nutritional and metabolic disease: Secondary | ICD-10-CM | POA: Insufficient documentation

## 2013-03-13 DIAGNOSIS — Z9861 Coronary angioplasty status: Secondary | ICD-10-CM | POA: Insufficient documentation

## 2013-03-13 DIAGNOSIS — Y838 Other surgical procedures as the cause of abnormal reaction of the patient, or of later complication, without mention of misadventure at the time of the procedure: Secondary | ICD-10-CM | POA: Insufficient documentation

## 2013-03-13 DIAGNOSIS — Z86718 Personal history of other venous thrombosis and embolism: Secondary | ICD-10-CM | POA: Insufficient documentation

## 2013-03-13 DIAGNOSIS — J45901 Unspecified asthma with (acute) exacerbation: Secondary | ICD-10-CM

## 2013-03-13 DIAGNOSIS — I499 Cardiac arrhythmia, unspecified: Secondary | ICD-10-CM | POA: Insufficient documentation

## 2013-03-13 DIAGNOSIS — F17200 Nicotine dependence, unspecified, uncomplicated: Secondary | ICD-10-CM | POA: Insufficient documentation

## 2013-03-13 DIAGNOSIS — Z794 Long term (current) use of insulin: Secondary | ICD-10-CM | POA: Insufficient documentation

## 2013-03-13 DIAGNOSIS — F411 Generalized anxiety disorder: Secondary | ICD-10-CM | POA: Insufficient documentation

## 2013-03-13 DIAGNOSIS — Z91199 Patient's noncompliance with other medical treatment and regimen due to unspecified reason: Secondary | ICD-10-CM | POA: Insufficient documentation

## 2013-03-13 DIAGNOSIS — I209 Angina pectoris, unspecified: Secondary | ICD-10-CM | POA: Insufficient documentation

## 2013-03-13 DIAGNOSIS — K219 Gastro-esophageal reflux disease without esophagitis: Secondary | ICD-10-CM | POA: Insufficient documentation

## 2013-03-13 DIAGNOSIS — J441 Chronic obstructive pulmonary disease with (acute) exacerbation: Secondary | ICD-10-CM | POA: Insufficient documentation

## 2013-03-13 DIAGNOSIS — Z862 Personal history of diseases of the blood and blood-forming organs and certain disorders involving the immune mechanism: Secondary | ICD-10-CM | POA: Insufficient documentation

## 2013-03-13 DIAGNOSIS — Z9119 Patient's noncompliance with other medical treatment and regimen: Secondary | ICD-10-CM | POA: Insufficient documentation

## 2013-03-13 LAB — POCT I-STAT, CHEM 8
BUN: 21 mg/dL (ref 6–23)
CREATININE: 0.9 mg/dL (ref 0.50–1.10)
Calcium, Ion: 1.11 mmol/L — ABNORMAL LOW (ref 1.12–1.23)
Chloride: 101 mEq/L (ref 96–112)
Glucose, Bld: 130 mg/dL — ABNORMAL HIGH (ref 70–99)
HCT: 46 % (ref 36.0–46.0)
HEMOGLOBIN: 15.6 g/dL — AB (ref 12.0–15.0)
POTASSIUM: 3.8 meq/L (ref 3.7–5.3)
SODIUM: 138 meq/L (ref 137–147)
TCO2: 24 mmol/L (ref 0–100)

## 2013-03-13 MED ORDER — HYDROCODONE-ACETAMINOPHEN 5-325 MG PO TABS: 1.0000 | ORAL_TABLET | Freq: Four times a day (QID) | ORAL | Status: DC | PRN

## 2013-03-13 MED ORDER — METOLAZONE 2.5 MG PO TABS: 2.5000 mg | ORAL_TABLET | Freq: Every day | ORAL | Status: DC

## 2013-03-13 MED ORDER — HYDROCODONE-ACETAMINOPHEN 5-325 MG PO TABS
1.0000 | ORAL_TABLET | Freq: Once | ORAL | Status: AC
  Administered 2013-03-13: 1 via ORAL
  Filled 2013-03-13: qty 1

## 2013-03-13 MED ORDER — CEPHALEXIN 250 MG PO CAPS
500.0000 mg | ORAL_CAPSULE | Freq: Once | ORAL | Status: AC
  Administered 2013-03-13: 500 mg via ORAL
  Filled 2013-03-13: qty 2

## 2013-03-13 MED ORDER — CEPHALEXIN 500 MG PO CAPS: 500.0000 mg | ORAL_CAPSULE | Freq: Four times a day (QID) | ORAL | Status: DC

## 2013-03-13 NOTE — ED Notes (Signed)
[facility]

## 2013-03-13 NOTE — ED Notes (Signed)
Biotronik Lumax 540 VR-T [id number]

## 2013-03-13 NOTE — ED Provider Notes (Addendum)
CSN: [id number]     Arrival date & time 03/13/13  1706 History   First MD Initiated Contact with Patient 03/13/13 1754     Chief Complaint  Patient presents with  . AICD Problem   (Consider location/radiation/quality/duration/timing/severity/associated sxs/prior Treatment) HPI Complains of defibrillator(AICD) firing one time this afternoon. She is asymptomatic other than cough sore throat left earache for the past 2 days. She's been noncompliant with all medications for the past 2 days. She also reports that she has run out of her Zaroxolyn. She denies shortness of breath pain is worse with swallowing. Denies chest pain or pressure Past Medical History  Diagnosis Date  . Chronic systolic heart failure     a.  secondary to severe ischemic cardiomyopathy with EF 20-25%;  b. s/p rimplantation of [employer] single chamber ICD by Dr Ladona Ridgel 02/2008;  c.  Right heart cath 04/2007: RA 18, PA 56/30 (40), PCWP 36. FICK 3.89/2.06 [place];  d. echo 9/09: Ef 20-25%, mild MR, mild LAE  . CAD (coronary artery disease)     a. s/p multiple PCIs; b. cath 10/07: LAD stent 40%, dx 80% ISR - tx with POBA; OM stent ok; ostial OM 60%; RCA occluded with L-R collats  . Tobacco abuse   . Alcohol abuse   . History of noncompliance with medical treatment   . Diabetes mellitus, type 2   . Dizziness   . HLD (hyperlipidemia)   . DVT (deep venous thrombosis)     coumadin  . Arterial embolism     s/p right iliofemoral embolectomy with fasciotomy  . V-tach   . AICD (automatic cardioverter/defibrillator) present   . Angina   . Asthma   . Shortness of breath   . Sleep apnea   . CHF (congestive heart failure)   . GERD (gastroesophageal reflux disease)   . Anxiety   . Myocardial infarction   . COPD (chronic obstructive pulmonary disease)    Past Surgical History  Procedure Laterality Date  . Diagnostic laparoscopy    . Cardiac catheterization    . Insert / replace / remove pacemaker    . Tubal ligation     Family  History  Problem Relation Age of Onset  . Coronary artery disease      FMILY HISTORY   History  Substance Use Topics  . Smoking status: Current Some Day Smoker -- 0.25 packs/day for 21 years    Types: Cigarettes  . Smokeless tobacco: Never Used  . Alcohol Use: No   OB History   Grav Para Term Preterm Abortions TAB SAB Ect Mult Living                 Review of Systems  Constitutional: Positive for fatigue.  HENT: Positive for congestion.        Left ear pain  Respiratory: Positive for cough.   Cardiovascular: Negative.   Gastrointestinal: Negative.   Musculoskeletal: Negative.   Skin: Negative.   Neurological: Negative.   Hematological: Positive for adenopathy.       Painful swollen lymph gland left side of neck  Psychiatric/Behavioral: Negative.   All other systems reviewed and are negative.    Allergies  Nyquil multi-symptom; Sudafed; Latex; and Ramipril  Home Medications   Current Outpatient Rx  Name  Route  Sig  Dispense  Refill  . albuterol (PROVENTIL HFA;VENTOLIN HFA) 108 (90 BASE) MCG/ACT inhaler   Inhalation   Inhale 2 puffs into the lungs every 6 (six) hours as needed. For shortness of breath         .  albuterol (PROVENTIL) (2.5 MG/3ML) 0.083% nebulizer solution   Nebulization   Take 2.5 mg by nebulization every 4 (four) hours as needed. For shortness of breath         . aspirin EC 81 MG tablet   Oral   Take 1 tablet (81 mg total) by mouth daily.         . digoxin (LANOXIN) 0.25 MG tablet   Oral   Take 0.25 mg by mouth daily.         . furosemide (LASIX) 40 MG tablet   Oral   Take 40-120 mg by mouth 2 (two) times daily. 120mg  in morning and 40mg  in evening         . insulin glargine (LANTUS) 100 UNIT/ML injection   Subcutaneous   Inject 10 Units into the skin as needed. Only takes if glucose> 200         . metolazone (ZAROXOLYN) 2.5 MG tablet   Oral   Take 1 tablet (2.5 mg total) by mouth as needed.   10 tablet   10   .  metoprolol succinate (TOPROL-XL) 25 MG 24 hr tablet   Oral   Take 25 mg by mouth as needed. Only takes SBP> 160         . nitroGLYCERIN (NITROSTAT) 0.4 MG SL tablet   Sublingual   Place 0.4 mg under the tongue every 5 (five) minutes as needed for chest pain.         . potassium chloride SA (K-DUR,KLOR-CON) 20 MEQ tablet   Oral   Take 20-40 mEq by mouth 2 (two) times daily. May take extra tablet if you use metolazone.         . Pseudoephedrine-DM-GG-APAP (SUDAFED COLD/COUGH PO)   Oral   Take 1 tablet by mouth daily as needed (for cold).         [name] spironolactone (ALDACTONE) 25 MG tablet   Oral   Take 25 mg by mouth daily.          BP 104/83  Pulse 86  Temp(Src) 98.2 F (36.8 C) (Oral)  Resp 23  SpO2 99% Physical Exam  Nursing note and vitals reviewed. Constitutional: She appears well-developed and well-nourished.  HENT:  Head: Normocephalic and atraumatic.  Right Ear: External ear normal.  Left Ear: External ear normal.  Nasal congestion. Bilateral tympanic membranes normal  Eyes: Conjunctivae are normal. Pupils are equal, round, and reactive to light.  Neck: Neck supple. No tracheal deviation present. No thyromegaly present.  Tender left-sided anterior cervical lymph node  Cardiovascular: Normal rate and regular rhythm.   No murmur heard. Pulmonary/Chest: Effort normal and breath sounds normal.  Abdominal: Soft. Bowel sounds are normal. She exhibits no distension. There is no tenderness.  Musculoskeletal: Normal range of motion. She exhibits no edema and no tenderness.  Lymphadenopathy:    She has cervical adenopathy.  Neurological: She is alert. Coordination normal.  Skin: Skin is warm and dry. No rash noted.  Psychiatric: She has a normal mood and affect.    ED Course  Procedures (including critical care time) Labs Review Labs Reviewed  CBC WITH DIFFERENTIAL  BASIC METABOLIC PANEL   Imaging Review No results found.  EKG Interpretation     Date/Time:  Friday March 13 2013 17:18:03 EST Ventricular Rate:  92 PR Interval:  160 QRS Duration: 108 QT Interval:  390 QTC Calculation: 482 R Axis:   113 Text Interpretation:  Sinus rhythm LAE, consider biatrial enlargement Right axis deviation Borderline repolarization  abnormality No significant change since last tracing Confirmed by Ethelda ChickJACUBOWITZ  MD, Janki Dike ([id number]) on 03/13/2013 6:14:29 PM           AICD was interrogated by company representative. Interrogation showed that patient had 2 runs of atrial fibrillation with rapid ventricular response lasting 7 seconds and 19 seconds followed by artifact. Headache 20 5 PM patient feels much improved after treatment with Norco. Chest xray viewed by me. Results for orders placed during the hospital encounter of 03/13/13  POCT I-STAT, CHEM 8      Result Value Range   Sodium 138  137 - 147 mEq/L   Potassium 3.8  3.7 - 5.3 mEq/L   Chloride 101  96 - 112 mEq/L   BUN 21  6 - 23 mg/dL   Creatinine, Ser 1.610.90  0.50 - 1.10 mg/dL   Glucose, Bld 096130 (*) 70 - 99 mg/dL   Calcium, Ion 0.451.11 (*) 1.12 - 1.23 mmol/L   TCO2 24  0 - 100 mmol/L   Hemoglobin 15.6 (*) 12.0 - 15.0 g/dL   HCT 40.946.0  81.136.0 - 91.446.0 %   Dg Chest 2 View  03/13/2013   CLINICAL DATA:  Chest pain.  EXAM: CHEST  2 VIEW  COMPARISON:  10/10/2012  FINDINGS: The heart is enlarged but stable. Stable right ventricular pacer wires/ AICD. Mild chronic bronchitic type lung changes along with thickened interseptal lines (Kerley B-lines) which could suggest mild interstitial edema. No pleural effusion. The bony thorax is intact.  IMPRESSION: Cardiac enlargement, stable.  Chronic bronchitic changes and probable mild interstitial edema.   Electronically Signed   By: Loralie ChampagneMark  Gallerani M.D.   On: 03/13/2013 19:56    MDM  No diagnosis found. Spoke with Dr. Delton SeeNelson from cardiology plan patient is encouraged to take all her medications as prescribed. I will write prescription for Zaroxolyn since she has run  out. Prescription Keflex and Norco. She is to followup with [facility] on Monday 03/16/2013. I counseled patient for 5 minutes on smoking cessation Diagnosis #1 cardiac arrhythmia #2 cervical adenitis #3 tobacco abuse #4 medication noncompliance #5 hyperglycemia  Doug SouSam Neyla Gauntt, MD 03/13/13 2038  Doug SouSam Yanni Quiroa, MD 03/13/13 2040

## 2013-03-13 NOTE — ED Notes (Signed)
[facility] report communicated to EDP and RN, report on chart.

## 2013-03-13 NOTE — ED Notes (Signed)
Pt told pharm tech that she has been taking metolazone, spironolactone and furosemide at same time.  and has had low BP lately so she has not been taking her metoprolol or lantus.  EDP notified

## 2013-03-13 NOTE — ED Notes (Signed)
Pt and EDP notified that [employer] staff is coming to interrogate pacemaker

## 2013-03-13 NOTE — ED Notes (Signed)
Pacemaker interrogation called for

## 2013-03-13 NOTE — Discharge Instructions (Signed)
Cardiac Arrhythmia Take all medications as prescribed. Your defibrillator fired most likely as a result missing your medications. Call [facility] on Monday, 03/16/2013 to schedule appointment to be seen that day. Ask your doctor to help you to stop smoking. Take Tylenol for mild pain or the pain medicine prescribed for bad pain. Your heart is a muscle that works to pump blood through your body by regular contractions. The beating of your heart is controlled by a system of special pacemaker cells. These cells control the electrical activity of the heart. When the system controlling this regular beating is disturbed, a heart rhythm abnormality (arrhythmia) results. WHEN YOUR HEART SKIPS A BEAT One of the most common and least serious heart arrhythmias is called an ectopic or premature atrial heartbeat (PAC). This may be noticed as a small change in your regular pulse. A PAC originates from the top part (atrium) of the heart. Within the right atrium, the SA node is the area that normally controls the regularity of the heart. PACs occur in heart tissue outside of the SA node region. You may feel this as a skipped beat or heart flutter, especially if several occur in succession or occur frequently.  Another arrhythmia is ventricular premature complex (VCP or PVC). These extra beats start out in the bottom, more muscular chambers of the heart. In most cases a PVC is harmless. If there are underlying causes that are making the heart irritable such as an overactive thyroid or a prior heart attack PVCs may be of more concern. In a few cases, medications to control the heart rhythm may be prescribed. Things to try at home:  Cut down or avoid alcohol, tobacco and caffeine.  Get enough sleep.  Reduce stress.  Exercise more. WHEN THE HEART BEATS TOO FAST Atrial tachycardia is a fast heart rate, which starts out in the atrium. It may last from minutes to much longer. Your heart may beat 140 to  240 times per minute instead of the normal 60 to 100.  Symptoms include a worried feeling (anxiety) and a sense that your heart is beating fast and hard.  You may be able to stop the fast rate by holding your breath or bearing down as if you were going to have a bowel movement.  This type of fast rate is usually not dangerous. Atrial fibrillation and atrial flutter are other fast rhythms that start in the atria. Both conditions keep the atria from filling with enough blood so the heart does not work well.  Symptoms include feeling light-headed or faint.  These fast rates may be the result of heart damage or disease. Too much thyroid hormone may play a role.  There may be no clear cause or it may be from heart disease or damage.  Medication or a special electrical treatment (cardioversion) may be needed to get the heart beating normally. Ventricular tachycardia is a fast heart rate that starts in the lower muscular chambers (ventricles) This is a serious disorder that requires treatment as soon as possible. You need someone else to get and use a small defibrillator.  Symptoms include collapse, chest pain, or being short of breath.  Treatment may include medication, procedures to improve blood flow to the heart, or an implantable cardiac defibrillator (ICD). DIAGNOSIS   A cardiogram (EKG or ECG) will be done to see the arrhythmia, as well as lab tests to check the underlying cause.  If the extra beats or fast rate come and go, you may  wear a Holter monitor that records your heart rate for a longer period of time. SEEK MEDICAL CARE IF:  You have irregular or fast heartbeats (palpitations).  You experience skipped beats.  You develop lightheadedness.  You have chest discomfort.  You have shortness of breath.  You have more frequent episodes, if you are already being treated. SEEK IMMEDIATE MEDICAL CARE IF:   You have severe chest pain, especially if the pain is crushing or  pressure-like and spreads to the arms, back, neck, or jaw, or if you have sweating, feeling sick to your stomach (nausea), or shortness of breath. THIS IS AN EMERGENCY. Do not wait to see if the pain will go away. Get medical help at once. Call 911 or 0 (operator). DO NOT drive yourself to the hospital.  You feel dizzy or faint.  You have episodes of previously documented atrial tachycardia that do not resolve with the techniques your caregiver has taught you.  Irregular or rapid heartbeats begin to occur more often than in the past, especially if they are associated with more pronounced symptoms or of longer duration. Document Released: 02/26/2005 Document Revised: 05/21/2011 Document Reviewed: 10/15/2007 [facility] Patient Information 2014 [place], [place].

## 2013-03-13 NOTE — ED Notes (Signed)
[facility]. Pt alert, NAD, calm, interactive. Denies sob, nausea, dizziness. "pain med helped earlier", but reports "pain returning".

## 2013-03-13 NOTE — ED Notes (Signed)
Pt alert, NAD, calm, interactive[facility] self. (Denies needs sx questions or concerns unmet). Out to d/c desk with familyl, steady gait. Declined w/c, first dose of abx given. Given rx x3.

## 2013-03-13 NOTE — ED Notes (Signed)
Pt is here due to ICD firing.  Pt has been feeling fatigued and was very tired going to bed without taking her meds.  This am she was having some chest pressure and went to car where ICD fired.

## 2013-03-17 ENCOUNTER — Encounter (HOSPITAL_COMMUNITY): Payer: Self-pay

## 2013-03-17 ENCOUNTER — Ambulatory Visit (HOSPITAL_COMMUNITY)
Admission: RE | Admit: 2013-03-17 | Discharge: 2013-03-17 | Disposition: A | Discharge status: Home / Self Care | Payer: Medicare Other | Source: Ambulatory Visit | Attending: Internal Medicine | Admitting: Internal Medicine

## 2013-03-17 ENCOUNTER — Telehealth (HOSPITAL_COMMUNITY): Payer: Self-pay

## 2013-03-17 ENCOUNTER — Other Ambulatory Visit (HOSPITAL_COMMUNITY): Payer: Self-pay

## 2013-03-17 VITALS — BP 104/68 | HR 85 | Resp 14 | Wt 141.8 lb

## 2013-03-17 DIAGNOSIS — I5022 Chronic systolic (congestive) heart failure: Secondary | ICD-10-CM

## 2013-03-17 DIAGNOSIS — I2510 Atherosclerotic heart disease of native coronary artery without angina pectoris: Secondary | ICD-10-CM | POA: Insufficient documentation

## 2013-03-17 DIAGNOSIS — F17200 Nicotine dependence, unspecified, uncomplicated: Secondary | ICD-10-CM | POA: Insufficient documentation

## 2013-03-17 LAB — BASIC METABOLIC PANEL
BUN: 21 mg/dL (ref 6–23)
CHLORIDE: 92 meq/L — AB (ref 96–112)
CO2: 26 mEq/L (ref 19–32)
Calcium: 9.5 mg/dL (ref 8.4–10.5)
Creatinine, Ser: 0.94 mg/dL (ref 0.50–1.10)
GFR calc non Af Amer: 68 mL/min — ABNORMAL LOW (ref >90)
GFR, EST AFRICAN AMERICAN: 78 mL/min — AB (ref >90)
Glucose, Bld: 153 mg/dL — ABNORMAL HIGH (ref 70–99)
POTASSIUM: 4.7 meq/L (ref 3.7–5.3)
Sodium: 134 mEq/L — ABNORMAL LOW (ref 137–147)

## 2013-03-17 LAB — MAGNESIUM: Magnesium: 1.6 mg/dL (ref 1.5–2.5)

## 2013-03-17 MED ORDER — MAGNESIUM 200 MG PO TABS: 200.0000 mg | ORAL_TABLET | Freq: Every day | ORAL | Status: DC

## 2013-03-17 NOTE — Telephone Encounter (Signed)
Pt. Called to make aware of low magnesium level from today's lab work.  Agreeable to begin magnesium 200mg  PO once daily.  Rx sent electronically to [facility] per pt request.  Will recheck mag level at f/u apt in 2 weeks. Ave FilterBradley, Samyak Sackmann Genevea

## 2013-03-17 NOTE — Patient Instructions (Signed)
Labs today  Take Metolazone today and tomorrow  Take extra Potassium when you take Metolazone  Your physician recommends that you schedule a follow-up appointment in: 2 weeks

## 2013-03-17 NOTE — Telephone Encounter (Signed)
Message copied by Chyrl CivatteBRADLEY, Kitana Gage G on Tue Mar 17, 2013  3:36 PM ------      Message from: ChickenLEGG, VirginiaMY D      Created: Tue Mar 17, 2013  1:30 PM       Start 200 mg Magnesium daily. Repeat Magnesium next week ------

## 2013-03-17 NOTE — Progress Notes (Signed)
Patient ID: Jessica MoccasinSherita [name], female   DOB: September 07, 1960, 53 y.o.   MRN: [id number] PCP: Dr Dierdre SearlesLi EP: Dr Ladona Ridgelaylor  HPI: [name] is a 53 y.o. with a history of ICM, chronic systolic heart failure EF 25%,  CAD, S/P multiple interventions, ICD implantation, DM, , hyperlipidemia, former smoker (quit 2 months ago), S/P right iliofemoral embolectomy and compartment fasciotomy of the right leg by Dr. Darrick PennaFields with LLE DVT 06/2010.  S/P ICD fire 03/13/13.   Cath 02/2011: showed severe 3 vessel CAD with occlusion of multiple stents, EF 10-15% Not a candidate for CABG. Discussion with patient about LVAD as an option, however patient declined.  Medical therapy continued. 02/2011 R heart Cath: RA = 18, RV = 67/10/20, PA = 56/30 (41), PCW = 23, Ao = 97/72 (83), LV = 93/20/30, Fick cardiac output/index = 2.8/1.7, PVR = 6.5 [place]  SVR = 1878, FA sat = 88%, PA sat = 42%, 46%,   ECHO 02/22/11: Only basal function preserved. EF 20-25%. ECHO 12/20/2011 EF 15%   She returns for follow up today. Says she did not take medications for 2 days 12/31 and 03/12/13. [facility]. Also placed antibiotics and one dose of metolazone. Denies PND/Orthopnea. SOB with exertion and complains of fatigue. Weight back down to 136 pounds after taking metolazone. Only takes for weight > 140 pounds.   She has not had any Toprol XL in 1 year. SBP at home 110. Taking all medications. Smokes a cigar twice a week.    She has inolerance of ACE-I due to severe cough.  Problem tolerating b-blocker and ARB due to low BP.     03/13/12 K 3.8 Creatinine 0.90  ROS: All systems negative except as listed in HPI, PMH and Problem List.  Past Medical History  Diagnosis Date  . Chronic systolic heart failure     a.  secondary to severe ischemic cardiomyopathy with EF 20-25%;  b. s/p rimplantation of [employer] single chamber ICD by Dr Ladona Ridgelaylor 02/2008;  c.  Right heart cath 04/2007: RA 18, PA 56/30 (40), PCWP 36. FICK 3.89/2.06  [place];  d. echo 9/09: Ef 20-25%, mild MR, mild LAE  . CAD (coronary artery disease)     a. s/p multiple PCIs; b. cath 10/07: LAD stent 40%, dx 80% ISR - tx with POBA; OM stent ok; ostial OM 60%; RCA occluded with L-R collats  . Tobacco abuse   . Alcohol abuse   . History of noncompliance with medical treatment   . Diabetes mellitus, type 2   . Dizziness   . HLD (hyperlipidemia)   . DVT (deep venous thrombosis)     coumadin  . Arterial embolism     s/p right iliofemoral embolectomy with fasciotomy  . V-tach   . AICD (automatic cardioverter/defibrillator) present   . Angina   . Asthma   . Shortness of breath   . Sleep apnea   . CHF (congestive heart failure)   . GERD (gastroesophageal reflux disease)   . Anxiety   . Myocardial infarction   . COPD (chronic obstructive pulmonary disease)     Current Outpatient Prescriptions  Medication Sig Dispense Refill  . albuterol (PROVENTIL HFA;VENTOLIN HFA) 108 (90 BASE) MCG/ACT inhaler Inhale 2 puffs into the lungs every 6 (six) hours as needed. For shortness of breath      . albuterol (PROVENTIL) (2.5 MG/3ML) 0.083% nebulizer solution Take 2.5 mg by nebulization every 4 (four) hours as needed. For shortness of  breath      . aspirin EC 81 MG tablet Take 1 tablet (81 mg total) by mouth daily.      . cephALEXin (KEFLEX) 500 MG capsule Take 1 capsule (500 mg total) by mouth 4 (four) times daily.  28 capsule  0  . digoxin (LANOXIN) 0.25 MG tablet Take 0.25 mg by mouth daily.      . furosemide (LASIX) 40 MG tablet Take 40-120 mg by mouth 2 (two) times daily. 120mg  in morning and 40mg  in evening      . HYDROcodone-acetaminophen (NORCO) 5-325 MG per tablet Take 1-2 tablets by mouth every 6 (six) hours as needed.  10 tablet  0  . insulin glargine (LANTUS) 100 UNIT/ML injection Inject 10 Units into the skin as needed. Only takes if glucose> 200      . metolazone (ZAROXOLYN) 2.5 MG tablet Take 1 tablet (2.5 mg total) by mouth as needed.  10 tablet  10   . metoprolol succinate (TOPROL-XL) 25 MG 24 hr tablet Take 25 mg by mouth as needed. Only takes SBP> 160      . nitroGLYCERIN (NITROSTAT) 0.4 MG SL tablet Place 0.4 mg under the tongue every 5 (five) minutes as needed for chest pain.      . potassium chloride SA (K-DUR,KLOR-CON) 20 MEQ tablet Take 20-40 mEq by mouth 2 (two) times daily. May take extra tablet if you use metolazone.      . Pseudoephedrine-DM-GG-APAP (SUDAFED COLD/COUGH PO) Take 1 tablet by mouth daily as needed (for cold).      [name] spironolactone (ALDACTONE) 25 MG tablet Take 25 mg by mouth daily.       No current facility-administered medications for this encounter.     PHYSICAL EXAM: Filed Vitals:   03/17/13 1123  BP: 104/68  Pulse: 85  Resp: 14  Weight: 141 lb 12 oz (64.297 kg)  SpO2: 94%   General: Chronically ill appearing.  No resp difficulty HEENT:  normal Neck: supple. JVP ~10.  Carotids 2+ bilaterally; no bruits. No lymphadenopathy or thryomegaly appreciated. Cor: PMI laterally displaced. Tachycardic rate & regular rhythm. soft  S3  Lungs: Clear Abdomen: obese, soft, nontender, ++ distended.  No hepatosplenomegaly. No bruits or masses. Good bowel sounds. Extremities: no cyanosis, clubbing, rash, edema Neuro: alert & orientedx3, cranial nerves grossly intact. Moves all 4 extremities w/o difficulty. Affect pleasant.   ASSESSMENT & PLAN: 1. Chronic systolic heart failure  ECHO 12/20/2011 EF 15%,  S/P [employer] ICD 2009 . She returns for follow up after ICD fire on 03/13/12. NYHA IIIb.  Volume status mildly elevated. Continue lasix 120 mg in am 40 mg in pm as well as spironolactone 25 mg daily . Instructed to take 2.5 mg Metolazone for the next 2 days.  Would like her weight to be < 140 and closer to 135 pounds.  Continue Toprol 25 mg daily as needed for SBP> 160 she is intolerant daily use and has not had for the last year. Continue digoxin 0.25 mg daily.  Intolerant Ace/Arb due to hypotension and dizziness Check  BMET and Magnesium today. --->K 4.7 Creatinine 0.94 Magnesium 1.6 . Start Magnesium 200 mg daily .   2. CAD, S/P multiple interventionsNo evidence of ischemia . Continue baby aspirin. 3. . Current smoker- Declines smoking cessation.     Follow up in 2 weeks to reassess volume status.    Valentin Benney NP-C

## 2013-04-01 ENCOUNTER — Ambulatory Visit (HOSPITAL_COMMUNITY)
Admission: RE | Admit: 2013-04-01 | Discharge: 2013-04-01 | Disposition: A | Discharge status: Home / Self Care | Payer: Medicare Other | Source: Ambulatory Visit | Attending: Cardiology | Admitting: Cardiology

## 2013-04-01 VITALS — BP 108/68 | HR 95 | Wt 136.0 lb

## 2013-04-01 DIAGNOSIS — I2510 Atherosclerotic heart disease of native coronary artery without angina pectoris: Secondary | ICD-10-CM | POA: Insufficient documentation

## 2013-04-01 DIAGNOSIS — I5022 Chronic systolic (congestive) heart failure: Secondary | ICD-10-CM | POA: Insufficient documentation

## 2013-04-01 DIAGNOSIS — F17200 Nicotine dependence, unspecified, uncomplicated: Secondary | ICD-10-CM | POA: Insufficient documentation

## 2013-04-01 MED ORDER — METOLAZONE 2.5 MG PO TABS: 2.5000 mg | ORAL_TABLET | ORAL | Status: DC

## 2013-04-01 NOTE — Progress Notes (Signed)
Patient ID: Jessica MoccasinSherita [name], female   DOB: Sep 06, 1960, 53 y.o.   MRN: [id number] PCP: Dr Dierdre SearlesLi EP: Dr Ladona Ridgelaylor  HPI: Jessica BurkeSherita is a 53 y.o. with a history of ICM, chronic systolic heart failure EF 25%,  CAD, S/P multiple interventions, ICD implantation, DM, , hyperlipidemia, former smoker (quit 2 months ago), S/P right iliofemoral embolectomy and compartment fasciotomy of the right leg by Dr. Darrick PennaFields with LLE DVT 06/2010.  S/P ICD fire 03/13/13.   Cath 02/2011: showed severe 3 vessel CAD with occlusion of multiple stents, EF 10-15% Not a candidate for CABG. Discussion with patient about LVAD as an option, however patient declined.  Medical therapy continued. 02/2011 R heart Cath: RA = 18, RV = 67/10/20, PA = 56/30 (41), PCW = 23, Ao = 97/72 (83), LV = 93/20/30, Fick cardiac output/index = 2.8/1.7, PVR = 6.5 Woods  SVR = 1878, FA sat = 88%, PA sat = 42%, 46%,   ECHO 02/22/11: Only basal function preserved. EF 20-25%. ECHO 12/20/2011 EF 15%   She returns for follow up today. Last visit magnesium started. Has more energy. Denies Orthopnea/PND/CP. No shocks. Mild dyspnea with exertion. Weight at home 134 pounds. Taking all medications.   03/13/13 K 3.8 Creatinine 0.90 03/17/13 K 4.7 Creatinine 0.94  Magnesium 1.6 Started on magnesium  ROS: All systems negative except as listed in HPI, PMH and Problem List.  Past Medical History  Diagnosis Date  . Chronic systolic heart failure     a.  secondary to severe ischemic cardiomyopathy with EF 20-25%;  b. s/p rimplantation of Biotronik single chamber ICD by Dr Ladona Ridgelaylor 02/2008;  c.  Right heart cath 04/2007: RA 18, PA 56/30 (40), PCWP 36. FICK 3.89/2.06 Woods;  d. echo 9/09: Ef 20-25%, mild MR, mild LAE  . CAD (coronary artery disease)     a. s/p multiple PCIs; b. cath 10/07: LAD stent 40%, dx 80% ISR - tx with POBA; OM stent ok; ostial OM 60%; RCA occluded with L-R collats  . Tobacco abuse   . Alcohol abuse   . History of noncompliance with medical treatment   .  Diabetes mellitus, type 2   . Dizziness   . HLD (hyperlipidemia)   . DVT (deep venous thrombosis)     coumadin  . Arterial embolism     s/p right iliofemoral embolectomy with fasciotomy  . V-tach   . AICD (automatic cardioverter/defibrillator) present   . Angina   . Asthma   . Shortness of breath   . Sleep apnea   . CHF (congestive heart failure)   . GERD (gastroesophageal reflux disease)   . Anxiety   . Myocardial infarction   . COPD (chronic obstructive pulmonary disease)     Current Outpatient Prescriptions  Medication Sig Dispense Refill  . albuterol (PROVENTIL HFA;VENTOLIN HFA) 108 (90 BASE) MCG/ACT inhaler Inhale 2 puffs into the lungs every 6 (six) hours as needed. For shortness of breath      . albuterol (PROVENTIL) (2.5 MG/3ML) 0.083% nebulizer solution Take 2.5 mg by nebulization every 4 (four) hours as needed. For shortness of breath      . aspirin EC 81 MG tablet Take 1 tablet (81 mg total) by mouth daily.      . cephALEXin (KEFLEX) 500 MG capsule Take 1 capsule (500 mg total) by mouth 4 (four) times daily.  28 capsule  0  . digoxin (LANOXIN) 0.25 MG tablet Take 0.25 mg by mouth daily.      . furosemide (LASIX) 40  MG tablet Take 40-120 mg by mouth 2 (two) times daily. 120mg  in morning and 40mg  in evening      . HYDROcodone-acetaminophen (NORCO) 5-325 MG per tablet Take 1-2 tablets by mouth every 6 (six) hours as needed.  10 tablet  0  . Magnesium 200 MG TABS Take 1 tablet (200 mg total) by mouth daily.  30 each  3  . metolazone (ZAROXOLYN) 2.5 MG tablet Take 1 tablet (2.5 mg total) by mouth once a week. Once a week and as needed  15 tablet  10  . metoprolol succinate (TOPROL-XL) 25 MG 24 hr tablet Take 25 mg by mouth as needed. Only takes SBP> 160      . nitroGLYCERIN (NITROSTAT) 0.4 MG SL tablet Place 0.4 mg under the tongue every 5 (five) minutes as needed for chest pain.      . potassium chloride SA (K-DUR,KLOR-CON) 20 MEQ tablet Take 20-40 mEq by mouth 2 (two) times  daily. May take extra tablet if you use metolazone.      [name] spironolactone (ALDACTONE) 25 MG tablet Take 25 mg by mouth daily.       No current facility-administered medications for this encounter.     PHYSICAL EXAM: Filed Vitals:   04/01/13 1325  BP: 108/68  Pulse: 95  Weight: 136 lb (61.689 kg)  SpO2: 93%   General: Chronically ill appearing.  No resp difficulty HEENT:  normal Neck: supple. JVP 5-6.  Carotids 2+ bilaterally; no bruits. No lymphadenopathy or thryomegaly appreciated. Cor: PMI laterally displaced. Tachycardic rate & regular rhythm. soft  S3  Lungs: Clear Abdomen: obese, soft, nontender, + distended.  No hepatosplenomegaly. No bruits or masses. Good bowel sounds. Extremities: no cyanosis, clubbing, rash, edema Neuro: alert & orientedx3, cranial nerves grossly intact. Moves all 4 extremities w/o difficulty. Affect pleasant.   ASSESSMENT & PLAN: 1. Chronic systolic heart failure  ECHO 12/20/2011 EF 15%,  S/P [employer] ICD 2009 . NYHA IIIB. Overall she is doing better with extra fluid off. Dry weight at home 134-136 pounds. Volume status stable.  Continue lasix 120 mg in am 40 mg in pm as well as spironolactone 25 mg daily.  Continue Toprol 25 mg daily as needed for SBP> 160 she is intolerant daily use and has not had for the last year. Continue digoxin 0.25 mg daily.  Intolerant Ace/Arb due to hypotension and dizziness  2. CAD, S/P multiple interventions. No evidence of ischemia . Continue baby aspirin. 3.  Current smoker- Cigars. Declines smoking cessation.     Follow up in 3 months   CLEGG,AMY NP-C

## 2013-04-01 NOTE — Patient Instructions (Signed)
Follow up in 3 months Take Metolazone 2.5 mg once week and as needed  Do the following things EVERYDAY: 1) Weigh yourself in the morning before breakfast. Write it down and keep it in a log. 2) Take your medicines as prescribed 3) Eat low salt foods-Limit salt (sodium) to 2000 mg per day.  4) Stay as active as you can everyday 5) Limit all fluids for the day to less than 2 liters

## 2013-04-07 ENCOUNTER — Other Ambulatory Visit (HOSPITAL_COMMUNITY): Payer: Self-pay | Admitting: Internal Medicine

## 2013-04-16 ENCOUNTER — Encounter: Payer: Medicare Other | Admitting: *Deleted

## 2013-04-16 ENCOUNTER — Telehealth: Payer: Self-pay | Admitting: Internal Medicine

## 2013-04-16 NOTE — Telephone Encounter (Signed)
Transmission scheduled for 2-5 and thought she had to do something for this.

## 2013-04-16 NOTE — Telephone Encounter (Signed)
New Problem:  Pt is requesting a call back to assist her w/ a remote transmission.

## 2013-04-29 ENCOUNTER — Encounter: Payer: Self-pay | Admitting: *Deleted

## 2013-05-11 ENCOUNTER — Other Ambulatory Visit (HOSPITAL_COMMUNITY): Payer: Self-pay | Admitting: Anesthesiology

## 2013-05-12 ENCOUNTER — Other Ambulatory Visit (HOSPITAL_COMMUNITY): Payer: Self-pay | Admitting: Anesthesiology

## 2013-06-02 ENCOUNTER — Encounter: Payer: Self-pay | Admitting: *Deleted

## 2013-06-19 ENCOUNTER — Encounter (HOSPITAL_COMMUNITY): Payer: Self-pay

## 2013-06-30 ENCOUNTER — Telehealth (HOSPITAL_COMMUNITY): Payer: Self-pay | Admitting: Cardiology

## 2013-06-30 NOTE — Telephone Encounter (Signed)
Pt called with c/o LE edema(L>R) and increased SOB Weight 138.2-stable Unable to walk because of pain  Per VO Ali Cosgrove,NP Take additional 2.5 mg Metolazone today with Potassium 20 meQ Call office later in the week with update

## 2013-07-03 ENCOUNTER — Emergency Department (HOSPITAL_COMMUNITY): Payer: Medicare Other

## 2013-07-03 ENCOUNTER — Encounter (HOSPITAL_COMMUNITY): Payer: Self-pay | Admitting: Radiology

## 2013-07-03 ENCOUNTER — Emergency Department (HOSPITAL_COMMUNITY)
Admission: EM | Admit: 2013-07-03 | Discharge: 2013-07-03 | Disposition: A | Discharge status: Home / Self Care | Payer: Medicare Other | Attending: Emergency Medicine | Admitting: Emergency Medicine

## 2013-07-03 DIAGNOSIS — I509 Heart failure, unspecified: Secondary | ICD-10-CM

## 2013-07-03 DIAGNOSIS — M79672 Pain in left foot: Secondary | ICD-10-CM

## 2013-07-03 DIAGNOSIS — F17200 Nicotine dependence, unspecified, uncomplicated: Secondary | ICD-10-CM | POA: Insufficient documentation

## 2013-07-03 DIAGNOSIS — F411 Generalized anxiety disorder: Secondary | ICD-10-CM | POA: Insufficient documentation

## 2013-07-03 DIAGNOSIS — Z79899 Other long term (current) drug therapy: Secondary | ICD-10-CM | POA: Insufficient documentation

## 2013-07-03 DIAGNOSIS — M79609 Pain in unspecified limb: Secondary | ICD-10-CM | POA: Insufficient documentation

## 2013-07-03 DIAGNOSIS — Z86718 Personal history of other venous thrombosis and embolism: Secondary | ICD-10-CM | POA: Insufficient documentation

## 2013-07-03 DIAGNOSIS — E785 Hyperlipidemia, unspecified: Secondary | ICD-10-CM | POA: Insufficient documentation

## 2013-07-03 DIAGNOSIS — K219 Gastro-esophageal reflux disease without esophagitis: Secondary | ICD-10-CM | POA: Insufficient documentation

## 2013-07-03 DIAGNOSIS — I2510 Atherosclerotic heart disease of native coronary artery without angina pectoris: Secondary | ICD-10-CM | POA: Insufficient documentation

## 2013-07-03 DIAGNOSIS — R079 Chest pain, unspecified: Secondary | ICD-10-CM

## 2013-07-03 DIAGNOSIS — J45901 Unspecified asthma with (acute) exacerbation: Secondary | ICD-10-CM

## 2013-07-03 DIAGNOSIS — Z9889 Other specified postprocedural states: Secondary | ICD-10-CM | POA: Insufficient documentation

## 2013-07-03 DIAGNOSIS — J441 Chronic obstructive pulmonary disease with (acute) exacerbation: Secondary | ICD-10-CM | POA: Insufficient documentation

## 2013-07-03 DIAGNOSIS — I209 Angina pectoris, unspecified: Secondary | ICD-10-CM | POA: Insufficient documentation

## 2013-07-03 DIAGNOSIS — I5022 Chronic systolic (congestive) heart failure: Secondary | ICD-10-CM | POA: Insufficient documentation

## 2013-07-03 DIAGNOSIS — I252 Old myocardial infarction: Secondary | ICD-10-CM | POA: Insufficient documentation

## 2013-07-03 DIAGNOSIS — E7800 Pure hypercholesterolemia, unspecified: Secondary | ICD-10-CM | POA: Insufficient documentation

## 2013-07-03 DIAGNOSIS — Z91040 Latex allergy status: Secondary | ICD-10-CM | POA: Insufficient documentation

## 2013-07-03 DIAGNOSIS — Z7982 Long term (current) use of aspirin: Secondary | ICD-10-CM | POA: Insufficient documentation

## 2013-07-03 DIAGNOSIS — R609 Edema, unspecified: Secondary | ICD-10-CM | POA: Insufficient documentation

## 2013-07-03 DIAGNOSIS — Z91199 Patient's noncompliance with other medical treatment and regimen due to unspecified reason: Secondary | ICD-10-CM | POA: Insufficient documentation

## 2013-07-03 DIAGNOSIS — R21 Rash and other nonspecific skin eruption: Secondary | ICD-10-CM | POA: Insufficient documentation

## 2013-07-03 DIAGNOSIS — Z95810 Presence of automatic (implantable) cardiac defibrillator: Secondary | ICD-10-CM | POA: Insufficient documentation

## 2013-07-03 DIAGNOSIS — Z7901 Long term (current) use of anticoagulants: Secondary | ICD-10-CM | POA: Insufficient documentation

## 2013-07-03 DIAGNOSIS — Z9119 Patient's noncompliance with other medical treatment and regimen: Secondary | ICD-10-CM | POA: Insufficient documentation

## 2013-07-03 DIAGNOSIS — E119 Type 2 diabetes mellitus without complications: Secondary | ICD-10-CM | POA: Insufficient documentation

## 2013-07-03 LAB — CBC WITH DIFFERENTIAL/PLATELET
BASOS ABS: 0 10*3/uL (ref 0.0–0.1)
BASOS PCT: 0 % (ref 0–1)
Eosinophils Absolute: 0.1 10*3/uL (ref 0.0–0.7)
Eosinophils Relative: 1 % (ref 0–5)
HCT: 42.9 % (ref 36.0–46.0)
Hemoglobin: 15 g/dL (ref 12.0–15.0)
LYMPHS PCT: 29 % (ref 12–46)
Lymphs Abs: 1.5 10*3/uL (ref 0.7–4.0)
MCH: 29 pg (ref 26.0–34.0)
MCHC: 35 g/dL (ref 30.0–36.0)
MCV: 82.8 fL (ref 78.0–100.0)
Monocytes Absolute: 0.4 10*3/uL (ref 0.1–1.0)
Monocytes Relative: 7 % (ref 3–12)
NEUTROS ABS: 3.3 10*3/uL (ref 1.7–7.7)
NEUTROS PCT: 63 % (ref 43–77)
Platelets: 170 10*3/uL (ref 150–400)
RBC: 5.18 MIL/uL — ABNORMAL HIGH (ref 3.87–5.11)
RDW: 15.6 % — ABNORMAL HIGH (ref 11.5–15.5)
WBC: 5.2 10*3/uL (ref 4.0–10.5)

## 2013-07-03 LAB — BASIC METABOLIC PANEL
BUN: 20 mg/dL (ref 6–23)
CO2: 24 mEq/L (ref 19–32)
Calcium: 9.1 mg/dL (ref 8.4–10.5)
Chloride: 94 mEq/L — ABNORMAL LOW (ref 96–112)
Creatinine, Ser: 0.89 mg/dL (ref 0.50–1.10)
GFR calc non Af Amer: 72 mL/min — ABNORMAL LOW (ref >90)
GFR, EST AFRICAN AMERICAN: 84 mL/min — AB (ref >90)
Glucose, Bld: 197 mg/dL — ABNORMAL HIGH (ref 70–99)
POTASSIUM: 3.7 meq/L (ref 3.7–5.3)
SODIUM: 134 meq/L — AB (ref 137–147)

## 2013-07-03 LAB — I-STAT TROPONIN, ED
TROPONIN I, POC: 0.01 ng/mL (ref 0.00–0.08)
TROPONIN I, POC: 0.01 ng/mL (ref 0.00–0.08)

## 2013-07-03 LAB — PRO B NATRIURETIC PEPTIDE: Pro B Natriuretic peptide (BNP): 1846 pg/mL — ABNORMAL HIGH (ref 0–125)

## 2013-07-03 MED ORDER — HYDROCODONE-ACETAMINOPHEN 5-325 MG PO TABS: 2.0000 | ORAL_TABLET | ORAL | Status: DC | PRN

## 2013-07-03 MED ORDER — MORPHINE SULFATE 4 MG/ML IJ SOLN
6.0000 mg | Freq: Once | INTRAMUSCULAR | Status: AC
  Administered 2013-07-03: 6 mg via INTRAVENOUS
  Filled 2013-07-03: qty 2

## 2013-07-03 MED ORDER — ASPIRIN 81 MG PO CHEW
324.0000 mg | CHEWABLE_TABLET | Freq: Once | ORAL | Status: AC
  Administered 2013-07-03: 324 mg via ORAL
  Filled 2013-07-03: qty 4

## 2013-07-03 MED ORDER — IOHEXOL 350 MG/ML SOLN
100.0000 mL | Freq: Once | INTRAVENOUS | Status: AC | PRN
  Administered 2013-07-03: 100 mL via INTRAVENOUS

## 2013-07-03 NOTE — ED Notes (Signed)
Patient transported to X-ray 

## 2013-07-03 NOTE — ED Notes (Signed)
Pt reports she is in so much pain that it is making her chest hurt, sinus rhythm on monitor.

## 2013-07-03 NOTE — ED Notes (Signed)
Patient transported to CT 

## 2013-07-03 NOTE — Discharge Instructions (Signed)
Please continue to take your cardiac medications as recommended by her cardiologist. If you develop worsening chest pain, new concerns, pass out or worsening shortness of breath please return to the ER and or call your cardiologist. If you change her mind and wish to be observed for medical management in the hospital please return to the ER.  If you were given medicines take as directed.  If you are on coumadin or contraceptives realize their levels and effectiveness is altered by many different medicines.  If you have any reaction (rash, tongues swelling, other) to the medicines stop taking and see a physician.   Please follow up as directed and return to the ER or see a physician for new or worsening symptoms.  Thank you. Filed Vitals:   07/03/13 0430 07/03/13 0526 07/03/13 0530 07/03/13 0600  BP: 111/76 106/75 105/71 112/71  Pulse: 81 86 85 82  Temp:      TempSrc:      Resp: 20 20 23 18   Height:      Weight:      SpO2: 98% 98% 97% 94%

## 2013-07-03 NOTE — ED Notes (Signed)
Pt reports intermittent left foot pain since Easter. Pt denies any trauma to left foot. Foot is swollen and is tender to palpation, no deformity noted. pt is able to move all toes, CMS intact. Pt A&Ox4, respirations equal and unlabored, skin warm and dry

## 2013-07-03 NOTE — ED Provider Notes (Signed)
CSN: [id number]     Arrival date & time 07/03/13  0128 History   First MD Initiated Contact with Patient 07/03/13 [phone]     Chief Complaint  Patient presents with  . Foot Pain     (Consider location/radiation/quality/duration/timing/severity/associated sxs/prior Treatment) HPI Comments: 53 year old female with cholesterol, smoking, CAD, systolic heart failure, dizziness, ventricular tachycardia, DVT, depression presents with intermittent left foot pain since Easter. Patient recalls possibly injuring while walking on something in the grass. No joint disease, gout or fevers or chills. Patient also reports recent anterior chest discomfort patient unable to describe, Constant ache, nonradiating. Unsure of similar to previous cardiac history. Patient has mild shortness of breath that is similar to previous, mild orthopnea. No weight change. Mild leg swelling bilateral.  Patient is a 53 y.o. female presenting with lower extremity pain. The history is provided by the patient.  Foot Pain Associated symptoms include chest pain and shortness of breath. Pertinent negatives include no abdominal pain and no headaches.    Past Medical History  Diagnosis Date  . Chronic systolic heart failure     a.  secondary to severe ischemic cardiomyopathy with EF 20-25%;  b. s/p rimplantation of Biotronik single chamber ICD by Dr Ladona Ridgel 02/2008;  c.  Right heart cath 04/2007: RA 18, PA 56/30 (40), PCWP 36. FICK 3.89/2.06 Woods;  d. echo 9/09: Ef 20-25%, mild MR, mild LAE  . CAD (coronary artery disease)     a. s/p multiple PCIs; b. cath 10/07: LAD stent 40%, dx 80% ISR - tx with POBA; OM stent ok; ostial OM 60%; RCA occluded with L-R collats  . Tobacco abuse   . Alcohol abuse   . History of noncompliance with medical treatment   . Diabetes mellitus, type 2   . Dizziness   . HLD (hyperlipidemia)   . DVT (deep venous thrombosis)     coumadin  . Arterial embolism     s/p right iliofemoral embolectomy with fasciotomy   . V-tach   . AICD (automatic cardioverter/defibrillator) present   . Angina   . Asthma   . Shortness of breath   . Sleep apnea   . CHF (congestive heart failure)   . GERD (gastroesophageal reflux disease)   . Anxiety   . Myocardial infarction   . COPD (chronic obstructive pulmonary disease)    Past Surgical History  Procedure Laterality Date  . Diagnostic laparoscopy    . Cardiac catheterization    . Insert / replace / remove pacemaker    . Tubal ligation     Family History  Problem Relation Age of Onset  . Coronary artery disease      FMILY HISTORY   History  Substance Use Topics  . Smoking status: Current Some Day Smoker -- 0.25 packs/day for 21 years    Types: Cigarettes  . Smokeless tobacco: Never Used  . Alcohol Use: No   OB History   Grav Para Term Preterm Abortions TAB SAB Ect Mult Living                 Review of Systems  Constitutional: Negative for fever and chills.  HENT: Negative for congestion.   Eyes: Negative for visual disturbance.  Respiratory: Positive for shortness of breath.   Cardiovascular: Positive for chest pain and leg swelling.  Gastrointestinal: Negative for vomiting and abdominal pain.  Genitourinary: Negative for dysuria and flank pain.  Musculoskeletal: Positive for arthralgias. Negative for back pain, neck pain and neck stiffness.  Skin: Negative for  rash.  Neurological: Negative for light-headedness and headaches.      Allergies  Nyquil multi-symptom; Sudafed; Latex; and Ramipril  Home Medications   Prior to Admission medications   Medication Sig Start Date End Date Taking? Authorizing Provider  aspirin EC 81 MG tablet Take 1 tablet (81 mg total) by mouth daily. 10/06/12  Yes Laurey Moralealton S McLean, MD  digoxin (LANOXIN) 0.25 MG tablet Take 0.25 mg by mouth daily. 06/19/12  Yes Dolores Pattyaniel R Bensimhon, MD  furosemide (LASIX) 40 MG tablet Take 2 tablets (80 mg total) by mouth 2 (two) times daily. 04/07/13  Yes Dolores Pattyaniel R Bensimhon, MD   Magnesium 200 MG TABS Take 1 tablet (200 mg total) by mouth daily. 03/17/13  Yes Dolores Pattyaniel R Bensimhon, MD  metolazone (ZAROXOLYN) 2.5 MG tablet Take 1 tablet (2.5 mg total) by mouth once a week. Once a week and as needed 04/01/13  Yes Amy D Clegg, NP  nitroGLYCERIN (NITROSTAT) 0.4 MG SL tablet Place 0.4 mg under the tongue every 5 (five) minutes as needed for chest pain.   Yes Historical Provider, MD  potassium chloride SA (K-DUR,KLOR-CON) 20 MEQ tablet Take 20-40 mEq by mouth 2 (two) times daily. May take extra tablet if you use metolazone. 06/19/12  Yes Dolores Pattyaniel R Bensimhon, MD  spironolactone (ALDACTONE) 25 MG tablet Take 25 mg by mouth daily. 06/19/12  Yes Bevelyn Bucklesaniel R Bensimhon, MD   BP 100/56  Pulse 83  Temp(Src) 97.7 F (36.5 C) (Oral)  Resp 18  Ht 5\' 3"  (1.6 m)  Wt 137 lb (62.143 kg)  BMI 24.27 kg/m2  SpO2 99% Physical Exam  Nursing note and vitals reviewed. Constitutional: She is oriented to person, place, and time. She appears well-developed and well-nourished.  HENT:  Head: Normocephalic and atraumatic.  Eyes: Conjunctivae are normal. Right eye exhibits no discharge. Left eye exhibits no discharge.  Neck: Normal range of motion. Neck supple. No tracheal deviation present.  Cardiovascular: Normal rate, regular rhythm and intact distal pulses.   Pulmonary/Chest: Effort normal and breath sounds normal.  Abdominal: Soft. She exhibits no distension. There is no tenderness. There is no guarding.  Musculoskeletal: She exhibits edema and tenderness.  Mild swelling and tenderness dorsum left mid foot, no ankle tenderness or ankle swelling, no warmth or sign of infection. No calf tenderness, very mild swelling bilateral lower extremities.  Neurological: She is alert and oriented to person, place, and time. No cranial nerve deficit.  Skin: Skin is warm. Rash (multiple excoriations to arms bilateral. No signs of active infection or cellulitis.) noted.  Psychiatric: She has a normal mood and affect.     ED Course  Procedures (including critical care time) Labs Review Labs Reviewed  BASIC METABOLIC PANEL - Abnormal; Notable for the following:    Sodium 134 (*)    Chloride 94 (*)    Glucose, Bld 197 (*)    GFR calc non Af Amer 72 (*)    GFR calc Af Amer 84 (*)    All other components within normal limits  PRO B NATRIURETIC PEPTIDE - Abnormal; Notable for the following:    Pro B Natriuretic peptide (BNP) 1846.0 (*)    All other components within normal limits  CBC WITH DIFFERENTIAL - Abnormal; Notable for the following:    RBC 5.18 (*)    RDW 15.6 (*)    All other components within normal limits  I-STAT TROPOININ, ED  I-STAT TROPOININ, ED  Rosezena SensorI-STAT TROPOININ, ED    Imaging Review Dg Chest 2 View  07/03/2013   CLINICAL DATA:  Chest pain and shortness of breath.  EXAM: CHEST  2 VIEW  COMPARISON:  DG CHEST 2 VIEW dated 03/13/2013  FINDINGS: Cardiac pacemaker. Cardiac enlargement. Coronary artery stents. Pulmonary vascularity is normal. No focal airspace disease or consolidation in the lungs. Mild central interstitial changes consistent with chronic bronchitis. No change since previous study.  IMPRESSION: No active cardiopulmonary disease.   Electronically Signed   By: Burman Nieves M.D.   On: 07/03/2013 03:06   Ct Angio Chest Pe W/cm &/or Wo Cm  07/03/2013   CLINICAL DATA:  Chest pain, shortness of breath, history of DVTs.  EXAM: CT ANGIOGRAPHY CHEST WITH CONTRAST  TECHNIQUE: Multidetector CT imaging of the chest was performed using the standard protocol during bolus administration of intravenous contrast. Multiplanar CT image reconstructions and MIPs were obtained to evaluate the vascular anatomy.  CONTRAST:  OMNIPAQUE IOHEXOL 350 MG/ML SOLN  COMPARISON:  DG CHEST 2 VIEW dated 07/03/2013  FINDINGS: Technically adequate study with good opacification of the central and segmental pulmonary arteries. No focal filling defects are demonstrated. No evidence of significant pulmonary  embolus. There is reflux of contrast material into the IVC and hepatic veins suggesting passive congestion. The heart is enlarged. Coronary artery stents are in place. Normal caliber thoracic aorta. Cardiac pacemaker. Esophagus is decompressed. Mediastinal and hilar lymph nodes are not pathologically enlarged. There is atelectasis in the lung bases with patchy ground-glass infiltrates bilaterally suggesting pulmonary edema. Focal honeycomb changes in the lung bases. Degenerative changes in the thoracic spine. No sternal depression.  Review of the MIP images confirms the above findings.  IMPRESSION: No evidence of significant pulmonary embolus. Cardiac enlargement with passive hepatic congestion and probable bilateral parenchymal edema, consistent with failure.   Electronically Signed   By: Burman Nieves M.D.   On: 07/03/2013 06:05   Dg Foot Complete Left  07/03/2013   CLINICAL DATA:  Left foot pain and swelling for 2 months.  EXAM: LEFT FOOT - COMPLETE 3+ VIEW  COMPARISON:  None.  FINDINGS: Degenerative changes in the first metatarsophalangeal joint. Old ununited ossicle adjacent to the cuboidal bone. There is no evidence of fracture or dislocation. There is no evidence of arthropathy or other focal bone abnormality. Soft tissues are unremarkable.  IMPRESSION: No acute bony abnormalities.   Electronically Signed   By: Burman Nieves M.D.   On: 07/03/2013 02:18     EKG Interpretation   Date/Time:  Friday July 03 2013 01:38:34 EDT Ventricular Rate:  85 PR Interval:  165 QRS Duration: 113 QT Interval:  402 QTC Calculation: 478 R Axis:   -23 Text Interpretation:  Sinus rhythm LAE, consider biatrial enlargement  Borderline intraventricular conduction delay Minimal ST depression,  inferior leads Baseline wander in lead(s) V2 similar previous Confirmed by  Inessa Wardrop  MD, Lakeena Downie ([id number]) on 07/03/2013 5:56:43 AM     EKG reviewed showed heart rate 85 sinus rhythm, left atrial enlargement, normal axis,  normal QT and PR, poor baseline, no acute ST elevation or significant ST depression. MDM   Final diagnoses:  Left foot pain  CHF (congestive heart failure)  Chest pain   Patient with known cardiac history and blood clot history. Patient explains to me that she gets mild chest discomfort when she's stressed, patient has been very stressed recently and is teary-eyed in the room. With history and risk factors plan for cardiac workup including troponin, EKG, and CT Angio chest to look for blood clot. Aspirin in the ER.  Nitroglycerin when necessary.  X-ray of foot no acute findings. Likely bone contusion. CT chest negative for pulmonary embolism. EKG similar previous and delta troponin is negative.  I reviewed her medical records and significant cardiac history and recent cardiology notes.  I had a long discussion with the patient regarding her high risk history and she agreed that she had been recommended medical management without further intervention except possible LVAD.  We discussed observation in the hospital for medical management and pain control versus close followup in the clinic and patient prefers to follow up in the clinic. She understands she is a high-risk patient and will return for worsening or different symptoms. She understands this may and likely is related to her heart. Patient's capacity to make decisions. Patient's symptoms did improve in the ER.  Paged cardiology to ensure close fup outpt.    Results and differential diagnosis were discussed with the patient. Close follow up outpatient was discussed, patient comfortable with the plan.   Filed Vitals:   07/03/13 0526 07/03/13 0530 07/03/13 0600 07/03/13 0642  BP: 106/75 105/71 112/71 93/71  Pulse: 86 85 82 83  Temp:      TempSrc:      Resp: 20 23 18 20   Height:      Weight:      SpO2: 98% 97% 94% 99%        Enid SkeensJoshua M Jadaya Sommerfield, MD 07/03/13 [phone]

## 2013-07-03 NOTE — ED Notes (Signed)
Waiting on Dr. Jodi MourningZavitz to touch base with cardiology

## 2013-07-07 NOTE — Progress Notes (Signed)
Patient ID: Jessica [name], female   DOB: 02-Jun-1960, 53 y.o.   MRN: [id number] PCP: Dr Dierdre SearlesLi EP: Dr Ladona Ridgelaylor  HPI: Jessica [name] is a 53 y.o. with a history of ICM, chronic systolic heart failure EF 25%,  CAD, S/P multiple interventions, ICD Biotronic, DM, , hyperlipidemia, former smoker (quit 2 months ago), S/P right iliofemoral embolectomy and compartment fasciotomy of the right leg by Dr. Darrick PennaFields with LLE DVT 06/2010.  S/P ICD fire 03/13/13.   She returns for follow up today. Evaluated in ED on Friday (07/03/13) for CP. She was later discharged CES negative and CT of chest negative for PE. Says she started feeling bad after Easter. Eating high salt diet and drinking fluid > 2 liters of fluid. Complaining dyspnea with  exertion. + Orthopnea sleeping on 4 pillows. She has 3 extra metolazone this week. Weight at home trending up to 135 up to 142 pounds. She has not had metoprolol in over 1 year. Intolerant Ace/Arb due to hypotension. Smokes 1 cigar a day.   LHC [facility]/RHC 02/2011: Severe 3 vessel CAD with occlusion of multiple stents, EF 10-15% Not a candidate for CABG.  RHC  RA 18, RV  67/10/20, PA  56/30 (41), PCW  23, Ao  97/72 (83), LV = 93/20/30, Fick cardiac output/index 2.8/1.7, PVR  6.5 Woods SVR 1878  ECHO 02/22/11: Only basal function preserved. EF 20-25%. ECHO 12/20/2011 EF 15%    03/13/13 K 3.8 Creatinine 0.90 03/17/13 K 4.7 Creatinine 0.94  Magnesium 1.6 Started on magnesium 07/03/13 K 3.7 Creatinine 0.89 Pro BNP 1848   ROS: All systems negative except as listed in HPI, PMH and Problem List.  Past Medical History  Diagnosis Date  . Chronic systolic heart failure     a.  secondary to severe ischemic cardiomyopathy with EF 20-25%;  b. s/p rimplantation of Biotronik single chamber ICD by Dr Ladona Ridgelaylor 02/2008;  c.  Right heart cath 04/2007: RA 18, PA 56/30 (40), PCWP 36. FICK 3.89/2.06 Woods;  d. echo 9/09: Ef 20-25%, mild MR, mild LAE  . CAD (coronary artery disease)     a. s/p multiple PCIs; b. cath 10/07:  LAD stent 40%, dx 80% ISR - tx with POBA; OM stent ok; ostial OM 60%; RCA occluded with L-R collats  . Tobacco abuse   . Alcohol abuse   . History of noncompliance with medical treatment   . Diabetes mellitus, type 2   . Dizziness   . HLD (hyperlipidemia)   . DVT (deep venous thrombosis)     coumadin  . Arterial embolism     s/p right iliofemoral embolectomy with fasciotomy  . V-tach   . AICD (automatic cardioverter/defibrillator) present   . Angina   . Asthma   . Shortness of breath   . Sleep apnea   . CHF (congestive heart failure)   . GERD (gastroesophageal reflux disease)   . Anxiety   . Myocardial infarction   . COPD (chronic obstructive pulmonary disease)     Current Outpatient Prescriptions  Medication Sig Dispense Refill  . aspirin EC 81 MG tablet Take 1 tablet (81 mg total) by mouth daily.      . digoxin (LANOXIN) 0.25 MG tablet Take 0.25 mg by mouth daily.      . furosemide (LASIX) 40 MG tablet Take 2 tablets (80 mg total) by mouth 2 (two) times daily.  60 tablet  6  . Magnesium 200 MG TABS Take 1 tablet (200 mg total) by mouth daily.  30 each  3  . metolazone (ZAROXOLYN) 2.5 MG tablet Take 1 tablet (2.5 mg total) by mouth once a week. Once a week and as needed  15 tablet  10  . nitroGLYCERIN (NITROSTAT) 0.4 MG SL tablet Place 0.4 mg under the tongue every 5 (five) minutes as needed for chest pain.      . potassium chloride SA (K-DUR,KLOR-CON) 20 MEQ tablet Take 20-40 mEq by mouth 2 (two) times daily. May take extra tablet if you use metolazone.      [name]. spironolactone (ALDACTONE) 25 MG tablet Take 25 mg by mouth daily.      [name]. HYDROcodone-acetaminophen (NORCO) 5-325 MG per tablet Take 2 tablets by mouth every 4 (four) hours as needed.  6 tablet  0   No current facility-administered medications for this encounter.     PHYSICAL EXAM: Filed Vitals:   07/08/13 0930  BP: 114/69  Pulse: 82  Weight: 141 lb (63.957 kg)  SpO2: 93%   General: Chronically ill appearing.   No resp difficulty. SOB ambulating in the clinic.  HEENT:  normal Neck: supple. JVP to ear.  Carotids 2+ bilaterally; no bruits. No lymphadenopathy or thryomegaly appreciated. Cor: PMI laterally displaced. Tachycardic rate & regular rhythm. soft  S3  Lungs: Clear Abdomen: obese, soft, nontender, +++ distended.  No hepatosplenomegaly. No bruits or masses. Good bowel sounds. Extremities: no cyanosis, clubbing, rash, RLE and LLE 1+  edema Neuro: alert & orientedx3, cranial nerves grossly intact. Moves all 4 extremities w/o difficulty. Affect pleasant.   ASSESSMENT & PLAN: 1. Chronic systolic heart failure  ECHO 12/20/2011 EF 15%,  S/P Biotronic ICD 2009 . NYHA IIIB-IV. Functional decline and has been using electric cart at grocery store.  Volume status elevated. Weight up 6-8 pounds from baseline. Give 80 mg IV lasix and 40 meq of kdur.  Voided > 700 cc.  Stop po lasix and switch to torsemide 40 mg twice a day.  She is intolerant metoprol and she has not taken in > 1 year.  Continue digoxin 0.25 mg daily.  Intolerant Ace/Arb due to hypotension and dizziness Check BMET and Pro BNP ---> K 4.6 Creatinine 1.2 Pro BNP 1854 In the past she has been reluctant to discuss advanced therapies but today she may consider. Will need to consider RHC once euvolemic. She is open to possible inotropes.  Reinforeced low salt food choices and limiting fluid intake to < 2 liters per day.  2. CAD, S/P multiple interventions. No evidence of ischemia . Continue baby aspirin. 3.  Current smoker- Cigars. Declines smoking cessation.   She remained in HF clinic > 2 hours receiving IV lasix. Follow up next week with Dr Gala RomneyBensimhon to reassess volume and discuss advanced therapies.    Laysha Childers D Bryan Goin NP-C

## 2013-07-08 ENCOUNTER — Ambulatory Visit (HOSPITAL_COMMUNITY)
Admission: RE | Admit: 2013-07-08 | Discharge: 2013-07-08 | Disposition: A | Discharge status: Home / Self Care | Payer: Medicare Other | Source: Ambulatory Visit | Attending: Internal Medicine | Admitting: Internal Medicine

## 2013-07-08 VITALS — BP 114/69 | HR 82 | Wt 141.0 lb

## 2013-07-08 DIAGNOSIS — F17200 Nicotine dependence, unspecified, uncomplicated: Secondary | ICD-10-CM

## 2013-07-08 DIAGNOSIS — I5022 Chronic systolic (congestive) heart failure: Secondary | ICD-10-CM | POA: Insufficient documentation

## 2013-07-08 DIAGNOSIS — I2510 Atherosclerotic heart disease of native coronary artery without angina pectoris: Secondary | ICD-10-CM

## 2013-07-08 LAB — PRO B NATRIURETIC PEPTIDE: Pro B Natriuretic peptide (BNP): 1854 pg/mL — ABNORMAL HIGH (ref 0–125)

## 2013-07-08 LAB — BASIC METABOLIC PANEL
BUN: 21 mg/dL (ref 6–23)
CALCIUM: 9.1 mg/dL (ref 8.4–10.5)
CO2: 24 mEq/L (ref 19–32)
Chloride: 98 mEq/L (ref 96–112)
Creatinine, Ser: 1.23 mg/dL — ABNORMAL HIGH (ref 0.50–1.10)
GFR calc non Af Amer: 49 mL/min — ABNORMAL LOW (ref >90)
GFR, EST AFRICAN AMERICAN: 57 mL/min — AB (ref >90)
Glucose, Bld: 200 mg/dL — ABNORMAL HIGH (ref 70–99)
POTASSIUM: 4.6 meq/L (ref 3.7–5.3)
Sodium: 136 mEq/L — ABNORMAL LOW (ref 137–147)

## 2013-07-08 MED ORDER — TORSEMIDE 20 MG PO TABS: 40.0000 mg | ORAL_TABLET | Freq: Two times a day (BID) | ORAL | Status: DC

## 2013-07-08 MED ORDER — POTASSIUM CHLORIDE CRYS ER 20 MEQ PO TBCR
40.0000 meq | EXTENDED_RELEASE_TABLET | Freq: Once | ORAL | Status: AC
  Administered 2013-07-08: 40 meq via ORAL

## 2013-07-08 MED ORDER — FUROSEMIDE 10 MG/ML IJ SOLN
80.0000 mg | Freq: Once | INTRAMUSCULAR | Status: AC
  Administered 2013-07-08: 80 mg via INTRAVENOUS

## 2013-07-08 NOTE — Patient Instructions (Signed)
Follow up next week  Stop lasix   Take Torsemide 40 mg twice a day   Do the following things EVERYDAY: 1) Weigh yourself in the morning before breakfast. Write it down and keep it in a log. 2) Take your medicines as prescribed 3) Eat low salt foods-Limit salt (sodium) to 2000 mg per day.  4) Stay as active as you can everyday 5) Limit all fluids for the day to less than 2 liters 

## 2013-07-08 NOTE — Progress Notes (Addendum)
Patient to be diuresed in clinic.  PIV 22g started in RLFA, 1 attempt, flushed with great blood return, patient tolerated well.  80 IV lasix and 40 PO potassium administered.  Patient resting comfortably on stretcher will call bell in reach.  Will continue to monitor closely.  Total urinary output since 80 IV lasix administration:  1st attempt unmeasured-Patient states "It was a lot, I went forever" 2nd and final attempt totalled 650 cc clear yellow non-odorous urine  Aundra MilletMegan Macie BurowsGenevea Keiston Manley

## 2013-07-14 ENCOUNTER — Encounter (HOSPITAL_COMMUNITY): Payer: Self-pay | Admitting: *Deleted

## 2013-07-14 ENCOUNTER — Ambulatory Visit (HOSPITAL_COMMUNITY)
Admission: RE | Admit: 2013-07-14 | Discharge: 2013-07-14 | Disposition: A | Discharge status: Home / Self Care | Payer: Medicare Other | Source: Ambulatory Visit | Attending: Internal Medicine | Admitting: Internal Medicine

## 2013-07-14 ENCOUNTER — Other Ambulatory Visit: Payer: Self-pay | Admitting: *Deleted

## 2013-07-14 VITALS — BP 102/58 | HR 85 | Wt 138.5 lb

## 2013-07-14 DIAGNOSIS — Z95810 Presence of automatic (implantable) cardiac defibrillator: Secondary | ICD-10-CM | POA: Insufficient documentation

## 2013-07-14 DIAGNOSIS — J449 Chronic obstructive pulmonary disease, unspecified: Secondary | ICD-10-CM | POA: Insufficient documentation

## 2013-07-14 DIAGNOSIS — K219 Gastro-esophageal reflux disease without esophagitis: Secondary | ICD-10-CM | POA: Insufficient documentation

## 2013-07-14 DIAGNOSIS — I82509 Chronic embolism and thrombosis of unspecified deep veins of unspecified lower extremity: Secondary | ICD-10-CM | POA: Insufficient documentation

## 2013-07-14 DIAGNOSIS — I252 Old myocardial infarction: Secondary | ICD-10-CM | POA: Insufficient documentation

## 2013-07-14 DIAGNOSIS — J4489 Other specified chronic obstructive pulmonary disease: Secondary | ICD-10-CM | POA: Insufficient documentation

## 2013-07-14 DIAGNOSIS — I5022 Chronic systolic (congestive) heart failure: Secondary | ICD-10-CM

## 2013-07-14 DIAGNOSIS — I2510 Atherosclerotic heart disease of native coronary artery without angina pectoris: Secondary | ICD-10-CM

## 2013-07-14 DIAGNOSIS — F17200 Nicotine dependence, unspecified, uncomplicated: Secondary | ICD-10-CM

## 2013-07-14 DIAGNOSIS — I2589 Other forms of chronic ischemic heart disease: Secondary | ICD-10-CM | POA: Insufficient documentation

## 2013-07-14 DIAGNOSIS — Z7901 Long term (current) use of anticoagulants: Secondary | ICD-10-CM | POA: Insufficient documentation

## 2013-07-14 DIAGNOSIS — E785 Hyperlipidemia, unspecified: Secondary | ICD-10-CM | POA: Insufficient documentation

## 2013-07-14 DIAGNOSIS — E119 Type 2 diabetes mellitus without complications: Secondary | ICD-10-CM | POA: Insufficient documentation

## 2013-07-14 DIAGNOSIS — I509 Heart failure, unspecified: Secondary | ICD-10-CM | POA: Insufficient documentation

## 2013-07-14 LAB — DIGOXIN LEVEL: Digoxin Level: 1 ng/mL (ref 0.8–2.0)

## 2013-07-14 LAB — BASIC METABOLIC PANEL
BUN: 23 mg/dL (ref 6–23)
CO2: 25 mEq/L (ref 19–32)
Calcium: 9.3 mg/dL (ref 8.4–10.5)
Chloride: 96 mEq/L (ref 96–112)
Creatinine, Ser: 0.88 mg/dL (ref 0.50–1.10)
GFR calc non Af Amer: 73 mL/min — ABNORMAL LOW (ref >90)
GFR, EST AFRICAN AMERICAN: 85 mL/min — AB (ref >90)
Glucose, Bld: 181 mg/dL — ABNORMAL HIGH (ref 70–99)
POTASSIUM: 4.1 meq/L (ref 3.7–5.3)
SODIUM: 136 meq/L — AB (ref 137–147)

## 2013-07-14 LAB — PRO B NATRIURETIC PEPTIDE: PRO B NATRI PEPTIDE: 2130 pg/mL — AB (ref 0–125)

## 2013-07-14 MED ORDER — LOSARTAN POTASSIUM 25 MG PO TABS
12.5000 mg | ORAL_TABLET | Freq: Every day | ORAL | Status: DC
Start: 2013-07-14 — End: 2013-09-03

## 2013-07-14 MED ORDER — SIMVASTATIN 20 MG PO TABS: 20.0000 mg | ORAL_TABLET | Freq: Every day | ORAL | Status: DC

## 2013-07-14 MED ORDER — METOLAZONE 2.5 MG PO TABS: 2.5000 mg | ORAL_TABLET | ORAL | Status: DC

## 2013-07-14 NOTE — Patient Instructions (Signed)
Take Metolazone 2.5 mg every Wednesday   Start Losartan 25 mg take 1/2 tab at bedtime  Start Simvastatin 20 mg daily (in the evening)  Labs today  Right Heart Catheterization, see instruction sheet  Your physician recommends that you schedule a follow-up appointment in: 3 weeks

## 2013-07-15 NOTE — Progress Notes (Signed)
Patient ID: Jessica [name], female   DOB: [birth year], 53 y.o.   MRN: [id number] PCP: Dr Dierdre SearlesLi EP: Dr Ladona Ridgelaylor  HPI: Jessica [name] is a 53 y.o. with a history of ICM, chronic systolic heart failure EF 25%,  CAD, S/P multiple interventions, ICD Biotronic, DM, , hyperlipidemia, former smoker, S/P right iliofemoral embolectomy and compartment fasciotomy of the right leg by Dr. Darrick PennaFields with LLE DVT 06/2010.  S/P ICD discharge 03/13/13.    LHC/RHC 02/2011: Severe 3 vessel CAD with occlusion of multiple stents, EF 10-15% Not a candidate for CABG; RA 18, RV  67/10/20, PA  56/30 (41), PCW  23, Ao  97/72 (83), LV = 93/20/30, Fick cardiac output/index 2.8/1.7, PVR  6.5 Woods SVR 1878  ECHO 02/22/11: Only basal function preserved. EF 20-25%. ECHO 12/20/2011 EF 15%   At last appointment, patient was volume overloaded with NYHA class IIIb-IV symptoms.  Lasix was stopped and torsemide 40 mg bid was started.  She got 80 mg IV Lasix in clinic.  She feels much better since then.  She has not used metolazone since torsemide was begun.  Weight is down 3 lbs.  Less abdominal distention.  Bendopnea has resolved.  She has 2 pillow orthopnea (down from 4 pillow orthopnea).  She is short of breath after walking 50 yards (improved) and is now able to get out and walk her poodles.    03/13/13 K 3.8 Creatinine 0.90 03/17/13 K 4.7 Creatinine 0.94  Magnesium 1.6 Started on magnesium 4/15 K 3.7 => 4.6, Creatinine 0.89 => 1.23 Pro BNP 1848   ROS: All systems negative except as listed in HPI, PMH and Problem List.  SH: smokes cigar daily, lives [place] boyfriend [place] 2 children [place].   Past Medical History  Diagnosis Date  . Chronic systolic heart failure     a.  secondary to severe ischemic cardiomyopathy with EF 20-25%;  b. s/p rimplantation of Biotronik single chamber ICD by Dr Ladona Ridgelaylor 02/2008;  c.  Right heart cath 04/2007: RA 18, PA 56/30 (40), PCWP 36. FICK 3.89/2.06 Woods;  d. echo 9/09: Ef 20-25%, mild MR, mild LAE  . CAD (coronary  artery disease)     a. s/p multiple PCIs; b. cath 10/07: LAD stent 40%, dx 80% ISR - tx with POBA; OM stent ok; ostial OM 60%; RCA occluded with L-R collats  . Tobacco abuse   . Alcohol abuse   . History of noncompliance with medical treatment   . Diabetes mellitus, type 2   . Dizziness   . HLD (hyperlipidemia)   . DVT (deep venous thrombosis)     coumadin  . Arterial embolism     s/p right iliofemoral embolectomy with fasciotomy  . V-tach   . AICD (automatic cardioverter/defibrillator) present   . Angina   . Asthma   . Shortness of breath   . Sleep apnea   . CHF (congestive heart failure)   . GERD (gastroesophageal reflux disease)   . Anxiety   . Myocardial infarction   . COPD (chronic obstructive pulmonary disease)     Current Outpatient Prescriptions  Medication Sig Dispense Refill  . aspirin EC 81 MG tablet Take 1 tablet (81 mg total) by mouth daily.      . digoxin (LANOXIN) 0.25 MG tablet Take 0.25 mg by mouth daily.      . Magnesium 200 MG TABS Take 1 tablet (200 mg total) by mouth daily.  30 each  3  . metolazone (ZAROXOLYN) 2.5 MG tablet Take 1 tablet (  2.5 mg total) by mouth once a week. Every Wednesday  15 tablet  10  . nitroGLYCERIN (NITROSTAT) 0.4 MG SL tablet Place 0.4 mg under the tongue every 5 (five) minutes as needed for chest pain.      . potassium chloride SA (K-DUR,KLOR-CON) 20 MEQ tablet Take 40 mEq by mouth daily. May take extra tablet if you use metolazone.      [name]. spironolactone (ALDACTONE) 25 MG tablet Take 25 mg by mouth daily.      [name]. torsemide (DEMADEX) 20 MG tablet Take 2 tablets (40 mg total) by mouth 2 (two) times daily.  120 tablet  6  . losartan (COZAAR) 25 MG tablet Take 0.5 tablets (12.5 mg total) by mouth daily.  15 tablet  3  . simvastatin (ZOCOR) 20 MG tablet Take 1 tablet (20 mg total) by mouth at bedtime.  30 tablet  3   No current facility-administered medications for this encounter.     PHYSICAL EXAM: Filed Vitals:   07/14/13 1005   BP: 102/58  Pulse: 85  Weight: 138 lb 8 oz (62.823 kg)  SpO2: 94%   General: Chronically ill appearing.  No resp difficulty. SOB ambulating in the clinic.  HEENT:  normal Neck: supple. JVP 8-9 cm.  Carotids 2+ bilaterally; no bruits. No lymphadenopathy or thryomegaly appreciated. Cor: PMI laterally displaced. Tachycardic rate & regular rhythm; wide split S2 Lungs: Clear Abdomen: obese, soft, nontender, mildly distended.  No hepatosplenomegaly. No bruits or masses. Good bowel sounds. Extremities: no cyanosis, clubbing, rash.  No edema. Neuro: alert & orientedx3, cranial nerves grossly intact. Moves all 4 extremities w/o difficulty. Affect pleasant.   ASSESSMENT & PLAN: 1. Chronic systolic heart failure: Ischemic cardiomyopathy.  ECHO 12/20/2011 EF 15%,  S/P Biotronic ICD 2009.  QRS not wide so not CRT candidate. NYHA III, improved from prior appointment.  Still some volume overload but improved.  - Continue torsemide and will add metolazone 2.5 mg to be taken once weekly on Wednesdays.  - She has not been on ARB or beta blocker due to low BP, poor tolerance.  Now that she is feeling better, I would like her to try losartan 12.5 mg to be taken in the evening.  - Continue digoxin but will check a level.  - BMET/BNP today. - She is not interested in LVAD but would be open to the possibility of a cardiac transplantation.  She understands that she will have to quit smoking to consider this.  Now that volume status is improving, will plan RHC to assess hemodynamics/cardiac output (next week).  2.  CAD, S/P multiple interventions. No chest pain, continue ASA 81.  She was on simvastatin in past and is not sure why she is off it now.  Will restart at 20 mg daily with lipids/LFTs in 2 months.  3.  Current smoker: Cigars. We discussed cessation.  If she is to be considered for transplant, will need to be off > 6 months.   Followup 1 week post-RHC.  [name] 07/15/2013

## 2013-07-16 ENCOUNTER — Encounter (HOSPITAL_COMMUNITY): Payer: Self-pay | Admitting: Pharmacy Technician

## 2013-07-17 ENCOUNTER — Telehealth (HOSPITAL_COMMUNITY): Payer: Self-pay | Admitting: *Deleted

## 2013-07-17 NOTE — Telephone Encounter (Signed)
Message copied by Noralee SpaceSCHUB, Keene Gilkey M on Fri Jul 17, 2013  2:35 PM ------      Message from: Jacqlyn KraussLANKFORD, ANNE M      Created: Thu Jul 16, 2013  7:14 AM                   ----- Message -----         From: Laurey Moralealton S McLean, MD         Sent: 07/15/2013   9:35 AM           To: Jacqlyn KraussAnne M Lankford, RN            Will not change digoxin yet, but would get digoxin level at next visit prior to taking her am level (trough).  Creatinine looks good. ------

## 2013-07-17 NOTE — Telephone Encounter (Signed)
Pt aware and agreeable.  

## 2013-07-20 ENCOUNTER — Telehealth: Payer: Self-pay | Admitting: Licensed Clinical Social Worker

## 2013-07-20 ENCOUNTER — Telehealth (HOSPITAL_COMMUNITY): Payer: Self-pay | Admitting: *Deleted

## 2013-07-20 NOTE — Telephone Encounter (Signed)
Cath sch for 5/12 resch to 5/14 at 9am, pt aware

## 2013-07-20 NOTE — Telephone Encounter (Signed)
CSW referred to assist with PCP. CSW contacted patient by phone to discuss insurance and provider options. Patient states she will call [facility] to schedule an appointment. CSW encouraged patient to return call to CSW if further help needed. Lasandra BeechJackie Delisha Peaden, LCSW [phone]

## 2013-07-22 ENCOUNTER — Encounter: Payer: Self-pay | Admitting: *Deleted

## 2013-07-23 ENCOUNTER — Encounter (HOSPITAL_COMMUNITY)
Admission: RE | Disposition: A | Discharge status: Home / Self Care | Payer: Self-pay | Source: Ambulatory Visit | Attending: Internal Medicine

## 2013-07-23 ENCOUNTER — Ambulatory Visit (HOSPITAL_COMMUNITY)
Admission: RE | Admit: 2013-07-23 | Discharge: 2013-07-23 | Disposition: A | Discharge status: Home / Self Care | Payer: Medicare Other | Source: Ambulatory Visit | Attending: Internal Medicine | Admitting: Internal Medicine

## 2013-07-23 DIAGNOSIS — I5022 Chronic systolic (congestive) heart failure: Secondary | ICD-10-CM | POA: Insufficient documentation

## 2013-07-23 DIAGNOSIS — I252 Old myocardial infarction: Secondary | ICD-10-CM | POA: Insufficient documentation

## 2013-07-23 DIAGNOSIS — I2510 Atherosclerotic heart disease of native coronary artery without angina pectoris: Secondary | ICD-10-CM | POA: Insufficient documentation

## 2013-07-23 DIAGNOSIS — E785 Hyperlipidemia, unspecified: Secondary | ICD-10-CM | POA: Insufficient documentation

## 2013-07-23 DIAGNOSIS — Z86718 Personal history of other venous thrombosis and embolism: Secondary | ICD-10-CM | POA: Insufficient documentation

## 2013-07-23 DIAGNOSIS — I2789 Other specified pulmonary heart diseases: Secondary | ICD-10-CM | POA: Insufficient documentation

## 2013-07-23 DIAGNOSIS — I509 Heart failure, unspecified: Secondary | ICD-10-CM

## 2013-07-23 DIAGNOSIS — Z9119 Patient's noncompliance with other medical treatment and regimen: Secondary | ICD-10-CM | POA: Insufficient documentation

## 2013-07-23 DIAGNOSIS — I2589 Other forms of chronic ischemic heart disease: Secondary | ICD-10-CM | POA: Insufficient documentation

## 2013-07-23 DIAGNOSIS — Z91199 Patient's noncompliance with other medical treatment and regimen due to unspecified reason: Secondary | ICD-10-CM | POA: Insufficient documentation

## 2013-07-23 DIAGNOSIS — Z87891 Personal history of nicotine dependence: Secondary | ICD-10-CM | POA: Insufficient documentation

## 2013-07-23 DIAGNOSIS — Z950 Presence of cardiac pacemaker: Secondary | ICD-10-CM | POA: Insufficient documentation

## 2013-07-23 HISTORY — PX: RIGHT HEART CATHETERIZATION: SHX5447

## 2013-07-23 LAB — CBC
HCT: 43.6 % (ref 36.0–46.0)
Hemoglobin: 15.2 g/dL — ABNORMAL HIGH (ref 12.0–15.0)
MCH: 28.6 pg (ref 26.0–34.0)
MCHC: 34.9 g/dL (ref 30.0–36.0)
MCV: 82.1 fL (ref 78.0–100.0)
PLATELETS: 165 10*3/uL (ref 150–400)
RBC: 5.31 MIL/uL — ABNORMAL HIGH (ref 3.87–5.11)
RDW: 15.4 % (ref 11.5–15.5)
WBC: 4.3 10*3/uL (ref 4.0–10.5)

## 2013-07-23 LAB — GLUCOSE, CAPILLARY
GLUCOSE-CAPILLARY: 143 mg/dL — AB (ref 70–99)
GLUCOSE-CAPILLARY: 151 mg/dL — AB (ref 70–99)
Glucose-Capillary: 165 mg/dL — ABNORMAL HIGH (ref 70–99)

## 2013-07-23 LAB — PROTIME-INR
INR: 1.12 (ref 0.00–1.49)
PROTHROMBIN TIME: 14.2 s (ref 11.6–15.2)

## 2013-07-23 SURGERY — RIGHT HEART CATH
Anesthesia: LOCAL

## 2013-07-23 MED ORDER — SODIUM CHLORIDE 0.9 % IV SOLN: 250.0000 mL | INTRAVENOUS | Status: DC | PRN

## 2013-07-23 MED ORDER — FENTANYL CITRATE 0.05 MG/ML IJ SOLN
INTRAMUSCULAR | Status: AC
  Filled 2013-07-23: qty 2

## 2013-07-23 MED ORDER — MIDAZOLAM HCL 2 MG/2ML IJ SOLN
INTRAMUSCULAR | Status: AC
  Filled 2013-07-23: qty 2

## 2013-07-23 MED ORDER — DIAZEPAM 5 MG PO TABS
5.0000 mg | ORAL_TABLET | ORAL | Status: AC
  Administered 2013-07-23: 5 mg via ORAL
  Filled 2013-07-23: qty 1

## 2013-07-23 MED ORDER — SODIUM CHLORIDE 0.9 % IJ SOLN: 3.0000 mL | Freq: Two times a day (BID) | INTRAMUSCULAR | Status: DC

## 2013-07-23 MED ORDER — HEPARIN SODIUM (PORCINE) 1000 UNIT/ML IJ SOLN
INTRAMUSCULAR | Status: AC
  Filled 2013-07-23: qty 1

## 2013-07-23 MED ORDER — SODIUM CHLORIDE 0.9 % IJ SOLN: 3.0000 mL | INTRAMUSCULAR | Status: DC | PRN

## 2013-07-23 MED ORDER — LIDOCAINE HCL (PF) 1 % IJ SOLN
INTRAMUSCULAR | Status: AC
  Filled 2013-07-23: qty 30

## 2013-07-23 MED ORDER — SODIUM CHLORIDE 0.9 % IV SOLN
INTRAVENOUS | Status: DC
  Administered 2013-07-23: 08:00:00 via INTRAVENOUS

## 2013-07-23 NOTE — Discharge Instructions (Signed)
Arteriogram Care After These instructions give you information on caring for yourself after your procedure. Your doctor may also give you more specific instructions. Call your doctor if you have any problems or questions after your procedure. HOME CARE  Stay in bed the rest of the day.  Do not lift anything heavier than 10 pounds (about a gallon of milk) for 2 days.  Do not walk a lot, run, or drive for 2 days.  Return to normal activities in 2 days or as told by your doctor. Finding out the results of your test Ask when your test results will be ready. Make sure you get your test results. GET HELP RIGHT AWAY IF:   You have fever of 102 F (38.9 C) or higher.  If the site is:  Bleeding.  Puffy (swollen) or red. If you go to the Emergency Room, tell your nurse that you have had an arteriogram. Take this paper with you to show the nurse. MAKE SURE YOU:  Understand these instructions.  Will watch your condition.  Will get help right away if you are not doing well or get worse. Document Released: 05/25/2008 Document Revised: 05/21/2011 Document Reviewed: 05/25/2008 [facility] Patient Information 2014 [employer] [place].

## 2013-07-23 NOTE — Interval H&P Note (Signed)
History and Physical Interval Note:  07/23/2013 9:23 AM  Jennye MoccasinSherita Dunkley  has presented today for surgery, with the diagnosis of hf  The various methods of treatment have been discussed with the patient and family. After consideration of risks, benefits and other options for treatment, the patient has consented to  Procedure(s): RIGHT HEART CATH (N/A) as a surgical intervention .  The patient's history has been reviewed, patient examined, no change in status, stable for surgery.  I have reviewed the patient's chart and labs.  Questions were answered to the patient's satisfaction.     Bevelyn Bucklesaniel R Bensimhon

## 2013-07-23 NOTE — CV Procedure (Addendum)
Cardiac Cath Procedure Note:  Indication:  Heart failure  Procedures performed:  1) Right heart catheterization  Description of procedure:   The risks and indication of the procedure were explained. Consent was signed and placed on the chart. An appropriate timeout was taken prior to the procedure. The right neck was prepped and draped in the routine sterile fashion and anesthetized with 1% local lidocaine.   A 7 FR venous sheath was placed in the right internal jugular vein using a modified Seldinger technique. A standard Swan-Ganz catheter was used for the procedure.   Complications: None apparent.  Findings:  RA =  15 RV =  78/5/18 PA =  79/31 (47) PCW = 12 Fick cardiac output/index = 2.1/1.3 Thermo CO/CI = 3.4/2.0 PVR = 16.5 Woods FA sat = 90% (pulse ox) PA sat = 39%, 40%  Assessment: 1. Well compensated left sided pressures with moderate PAH and low cardiac output  Plan/Discussion:  Will repeat echo to look at RV.  Will likely need milrinone  Dolores Pattyaniel R Bensimhon  MD  9:24 AM

## 2013-07-23 NOTE — H&P (View-Only) (Signed)
Patient ID: Jessica [name], female   DOB: [birth year], 53 y.o.   MRN: [id number] PCP: Dr Dierdre SearlesLi EP: Dr Ladona Ridgelaylor  HPI: Jessica [name] is a 53 y.o. with a history of ICM, chronic systolic heart failure EF 25%,  CAD, S/P multiple interventions, ICD Biotronic, DM, , hyperlipidemia, former smoker, S/P right iliofemoral embolectomy and compartment fasciotomy of the right leg by Dr. Darrick PennaFields with LLE DVT 06/2010.  S/P ICD discharge 03/13/13.    LHC/RHC 02/2011: Severe 3 vessel CAD with occlusion of multiple stents, EF 10-15% Not a candidate for CABG; RA 18, RV  67/10/20, PA  56/30 (41), PCW  23, Ao  97/72 (83), LV = 93/20/30, Fick cardiac output/index 2.8/1.7, PVR  6.5 Woods SVR 1878  ECHO 02/22/11: Only basal function preserved. EF 20-25%. ECHO 12/20/2011 EF 15%   At last appointment, patient was volume overloaded with NYHA class IIIb-IV symptoms.  Lasix was stopped and torsemide 40 mg bid was started.  She got 80 mg IV Lasix in clinic.  She feels much better since then.  She has not used metolazone since torsemide was begun.  Weight is down 3 lbs.  Less abdominal distention.  Bendopnea has resolved.  She has 2 pillow orthopnea (down from 4 pillow orthopnea).  She is short of breath after walking 50 yards (improved) and is now able to get out and walk her poodles.    03/13/13 K 3.8 Creatinine 0.90 03/17/13 K 4.7 Creatinine 0.94  Magnesium 1.6 Started on magnesium 4/15 K 3.7 => 4.6, Creatinine 0.89 => 1.23 Pro BNP 1848   ROS: All systems negative except as listed in HPI, PMH and Problem List.  SH: smokes cigar daily, lives with boyfriend and 2 children in [place].   Past Medical History  Diagnosis Date  . Chronic systolic heart failure     a.  secondary to severe ischemic cardiomyopathy with EF 20-25%;  b. s/p rimplantation of Biotronik single chamber ICD by Dr Ladona Ridgelaylor 02/2008;  c.  Right heart cath 04/2007: RA 18, PA 56/30 (40), PCWP 36. FICK 3.89/2.06 Woods;  d. echo 9/09: Ef 20-25%, mild MR, mild LAE  . CAD (coronary  artery disease)     a. s/p multiple PCIs; b. cath 10/07: LAD stent 40%, dx 80% ISR - tx with POBA; OM stent ok; ostial OM 60%; RCA occluded with L-R collats  . Tobacco abuse   . Alcohol abuse   . History of noncompliance with medical treatment   . Diabetes mellitus, type 2   . Dizziness   . HLD (hyperlipidemia)   . DVT (deep venous thrombosis)     coumadin  . Arterial embolism     s/p right iliofemoral embolectomy with fasciotomy  . V-tach   . AICD (automatic cardioverter/defibrillator) present   . Angina   . Asthma   . Shortness of breath   . Sleep apnea   . CHF (congestive heart failure)   . GERD (gastroesophageal reflux disease)   . Anxiety   . Myocardial infarction   . COPD (chronic obstructive pulmonary disease)     Current Outpatient Prescriptions  Medication Sig Dispense Refill  . aspirin EC 81 MG tablet Take 1 tablet (81 mg total) by mouth daily.      . digoxin (LANOXIN) 0.25 MG tablet Take 0.25 mg by mouth daily.      . Magnesium 200 MG TABS Take 1 tablet (200 mg total) by mouth daily.  30 each  3  . metolazone (ZAROXOLYN) 2.5 MG tablet Take 1 tablet (  2.5 mg total) by mouth once a week. Every Wednesday  15 tablet  10  . nitroGLYCERIN (NITROSTAT) 0.4 MG SL tablet Place 0.4 mg under the tongue every 5 (five) minutes as needed for chest pain.      . potassium chloride SA (K-DUR,KLOR-CON) 20 MEQ tablet Take 40 mEq by mouth daily. May take extra tablet if you use metolazone.      . spironolactone (ALDACTONE) 25 MG tablet Take 25 mg by mouth daily.      . torsemide (DEMADEX) 20 MG tablet Take 2 tablets (40 mg total) by mouth 2 (two) times daily.  120 tablet  6  . losartan (COZAAR) 25 MG tablet Take 0.5 tablets (12.5 mg total) by mouth daily.  15 tablet  3  . simvastatin (ZOCOR) 20 MG tablet Take 1 tablet (20 mg total) by mouth at bedtime.  30 tablet  3   No current facility-administered medications for this encounter.     PHYSICAL EXAM: Filed Vitals:   07/14/13 1005   BP: 102/58  Pulse: 85  Weight: 138 lb 8 oz (62.823 kg)  SpO2: 94%   General: Chronically ill appearing.  No resp difficulty. SOB ambulating in the clinic.  HEENT:  normal Neck: supple. JVP 8-9 cm.  Carotids 2+ bilaterally; no bruits. No lymphadenopathy or thryomegaly appreciated. Cor: PMI laterally displaced. Tachycardic rate & regular rhythm; wide split S2 Lungs: Clear Abdomen: obese, soft, nontender, mildly distended.  No hepatosplenomegaly. No bruits or masses. Good bowel sounds. Extremities: no cyanosis, clubbing, rash.  No edema. Neuro: alert & orientedx3, cranial nerves grossly intact. Moves all 4 extremities w/o difficulty. Affect pleasant.   ASSESSMENT & PLAN: 1. Chronic systolic heart failure: Ischemic cardiomyopathy.  ECHO 12/20/2011 EF 15%,  S/P Biotronic ICD 2009.  QRS not wide so not CRT candidate. NYHA III, improved from prior appointment.  Still some volume overload but improved.  - Continue torsemide and will add metolazone 2.5 mg to be taken once weekly on Wednesdays.  - She has not been on ARB or beta blocker due to low BP, poor tolerance.  Now that she is feeling better, I would like her to try losartan 12.5 mg to be taken in the evening.  - Continue digoxin but will check a level.  - BMET/BNP today. - She is not interested in LVAD but would be open to the possibility of a cardiac transplantation.  She understands that she will have to quit smoking to consider this.  Now that volume status is improving, will plan RHC to assess hemodynamics/cardiac output (next week).  2.  CAD, S/P multiple interventions. No chest pain, continue ASA 81.  She was on simvastatin in past and is not sure why she is off it now.  Will restart at 20 mg daily with lipids/LFTs in 2 months.  3.  Current smoker: Cigars. We discussed cessation.  If she is to be considered for transplant, will need to be off > 6 months.   Followup 1 week post-RHC.  Jessica [name] 07/15/2013   

## 2013-07-24 ENCOUNTER — Telehealth (HOSPITAL_COMMUNITY): Payer: Self-pay | Admitting: Cardiology

## 2013-07-24 NOTE — Telephone Encounter (Signed)
Pt schedule for RHC 07/23/13 cpt code [id number] icd9 428.22 With pts [facility] pre cert #[id number]

## 2013-07-28 ENCOUNTER — Telehealth: Payer: Self-pay | Admitting: Internal Medicine

## 2013-07-28 NOTE — Telephone Encounter (Signed)
Noted in [employer]. 

## 2013-07-28 NOTE — Telephone Encounter (Signed)
Follow up       Pt called to report she got a letter form Device [facility].  Pt stated she gets her device checked at the [facility] Dr Bensimhon's office.  Pt does not do transmitions remotely  Pt does not have a land line.    If you have any questions please give her a call.

## 2013-08-04 ENCOUNTER — Other Ambulatory Visit (HOSPITAL_COMMUNITY): Payer: Self-pay | Admitting: Internal Medicine

## 2013-08-05 ENCOUNTER — Ambulatory Visit (HOSPITAL_COMMUNITY)
Admission: RE | Admit: 2013-08-05 | Discharge: 2013-08-05 | Disposition: A | Discharge status: Home / Self Care | Payer: Medicare Other | Source: Ambulatory Visit | Attending: Internal Medicine | Admitting: Internal Medicine

## 2013-08-05 VITALS — BP 90/62 | HR 78 | Wt 137.5 lb

## 2013-08-05 DIAGNOSIS — Z86718 Personal history of other venous thrombosis and embolism: Secondary | ICD-10-CM | POA: Insufficient documentation

## 2013-08-05 DIAGNOSIS — K219 Gastro-esophageal reflux disease without esophagitis: Secondary | ICD-10-CM | POA: Insufficient documentation

## 2013-08-05 DIAGNOSIS — F411 Generalized anxiety disorder: Secondary | ICD-10-CM | POA: Insufficient documentation

## 2013-08-05 DIAGNOSIS — I252 Old myocardial infarction: Secondary | ICD-10-CM | POA: Insufficient documentation

## 2013-08-05 DIAGNOSIS — I509 Heart failure, unspecified: Secondary | ICD-10-CM | POA: Insufficient documentation

## 2013-08-05 DIAGNOSIS — Z79899 Other long term (current) drug therapy: Secondary | ICD-10-CM | POA: Insufficient documentation

## 2013-08-05 DIAGNOSIS — J4489 Other specified chronic obstructive pulmonary disease: Secondary | ICD-10-CM | POA: Insufficient documentation

## 2013-08-05 DIAGNOSIS — E785 Hyperlipidemia, unspecified: Secondary | ICD-10-CM | POA: Insufficient documentation

## 2013-08-05 DIAGNOSIS — Z7982 Long term (current) use of aspirin: Secondary | ICD-10-CM | POA: Insufficient documentation

## 2013-08-05 DIAGNOSIS — J449 Chronic obstructive pulmonary disease, unspecified: Secondary | ICD-10-CM | POA: Insufficient documentation

## 2013-08-05 DIAGNOSIS — E119 Type 2 diabetes mellitus without complications: Secondary | ICD-10-CM | POA: Insufficient documentation

## 2013-08-05 DIAGNOSIS — Z95810 Presence of automatic (implantable) cardiac defibrillator: Secondary | ICD-10-CM | POA: Insufficient documentation

## 2013-08-05 DIAGNOSIS — Z7901 Long term (current) use of anticoagulants: Secondary | ICD-10-CM | POA: Insufficient documentation

## 2013-08-05 DIAGNOSIS — I2510 Atherosclerotic heart disease of native coronary artery without angina pectoris: Secondary | ICD-10-CM | POA: Insufficient documentation

## 2013-08-05 DIAGNOSIS — I2789 Other specified pulmonary heart diseases: Secondary | ICD-10-CM

## 2013-08-05 DIAGNOSIS — F17200 Nicotine dependence, unspecified, uncomplicated: Secondary | ICD-10-CM | POA: Insufficient documentation

## 2013-08-05 DIAGNOSIS — I5022 Chronic systolic (congestive) heart failure: Secondary | ICD-10-CM | POA: Insufficient documentation

## 2013-08-05 DIAGNOSIS — Z9861 Coronary angioplasty status: Secondary | ICD-10-CM | POA: Insufficient documentation

## 2013-08-05 DIAGNOSIS — I2589 Other forms of chronic ischemic heart disease: Secondary | ICD-10-CM | POA: Insufficient documentation

## 2013-08-05 LAB — DIGOXIN LEVEL: Digoxin Level: 0.5 ng/mL — ABNORMAL LOW (ref 0.8–2.0)

## 2013-08-05 MED ORDER — SILDENAFIL CITRATE 20 MG PO TABS: 20.0000 mg | ORAL_TABLET | Freq: Three times a day (TID) | ORAL | Status: DC

## 2013-08-05 NOTE — Progress Notes (Signed)
Patient ID: Jessica [name], female   DOB: 06-14-1960, 53 y.o.   MRN: [id number] PCP: Dr Dierdre Searles EP: Dr Ladona Ridgel  HPI: Jessica [name] is a 53 y.o. with a history of ICM, chronic systolic heart failure EF 25%,  CAD, S/P multiple interventions, ICD Biotronic, DM, , hyperlipidemia, former smoker, S/P right iliofemoral embolectomy and compartment fasciotomy of the right leg by Dr. Darrick Penna with LLE DVT 06/2010.  S/P ICD discharge 03/13/13.    LHC/RHC 02/2011: Severe 3 vessel CAD with occlusion of multiple stents, EF 10-15% Not a candidate for CABG; RA 18, RV  67/10/20, PA  56/30 (41), PCW  23, Ao  97/72 (83), LV = 93/20/30, Fick cardiac output/index 2.8/1.7, PVR  6.5 Woods SVR 1878 RHC 07/23/2013: RA 15, RV 78/5/18, PA 79/31 (47), PCWP 12, Fick CO/CI 2.1/1.3, Thermo CO/CI 3.4/2.0, PVR 16.5, PA 39% and 40%  ECHO 02/22/11: Only basal function preserved. EF 20-25%. ECHO 12/20/2011 EF 15%   Follow up for Heart Failure: Last visit started metolazone 2.5 mg every Wednesday, started losartan 12.5 mg daily and simvastatin 20 mg daily which she tolerated. Had RHC which showed moderate PAH and low CO. Denies SOB, CP or edema. + SOB with exertion and abdominal distention. Occasional bendopnea but improving. Weight at home 135 lbs. Following a low salt diet and trying to drink less than 2 L a day.   03/13/13 K 3.8 Creatinine 0.90 03/17/13 K 4.7 Creatinine 0.94  Magnesium 1.6 Started on magnesium 4/15 K 3.7 => 4.6, Creatinine 0.89 => 1.23 Pro BNP 1848  07/2013: K 4.1, creatinine 0.88, digoxin 1.0, Pro BNP 2130  ROS: All systems negative except as listed in HPI, PMH and Problem List.  SH: smokes cigar daily, Drinks occasional beer; lives with boyfriend and 2 children in [place].   FH: Jessica [name]: DM2, HTN, CKD  Father deceased: MI, CAD, HTN   Past Medical History  Diagnosis Date  . Chronic systolic heart failure     a.  secondary to severe ischemic cardiomyopathy with EF 20-25%;  b. s/p rimplantation of Biotronik single chamber  ICD by Dr Ladona Ridgel 02/2008;  c.  Right heart cath 04/2007: RA 18, PA 56/30 (40), PCWP 36. FICK 3.89/2.06 Woods;  d. echo 9/09: Ef 20-25%, mild MR, mild LAE  . CAD (coronary artery disease)     a. s/p multiple PCIs; b. cath 10/07: LAD stent 40%, dx 80% ISR - tx with POBA; OM stent ok; ostial OM 60%; RCA occluded with L-R collats  . Tobacco abuse   . Alcohol abuse   . History of noncompliance with medical treatment   . Diabetes mellitus, type 2   . Dizziness   . HLD (hyperlipidemia)   . DVT (deep venous thrombosis)     coumadin  . Arterial embolism     s/p right iliofemoral embolectomy with fasciotomy  . V-tach   . AICD (automatic cardioverter/defibrillator) present   . Angina   . Asthma   . Shortness of breath   . Sleep apnea   . CHF (congestive heart failure)   . GERD (gastroesophageal reflux disease)   . Anxiety   . Myocardial infarction   . COPD (chronic obstructive pulmonary disease)     Current Outpatient Prescriptions  Medication Sig Dispense Refill  . albuterol (PROVENTIL) (2.5 MG/3ML) 0.083% nebulizer solution Take 2.5 mg by nebulization every 6 (six) hours as needed for wheezing or shortness of breath.      [name] aspirin EC 81 MG tablet Take 1 tablet (81 mg total)  by mouth daily.      . digoxin (LANOXIN) 0.25 MG tablet Take 0.25 mg by mouth daily.      [name]. KLOR-CON M20 20 MEQ tablet TAKE 2 TABLETS BY MOUTH DAILY. MAY TAKE EXTRA TAB IF YOU USE METOLAZONE  70 tablet  5  . losartan (COZAAR) 25 MG tablet Take 0.5 tablets (12.5 mg total) by mouth daily.  15 tablet  3  . Magnesium 200 MG TABS Take 1 tablet (200 mg total) by mouth daily.  30 each  3  . metolazone (ZAROXOLYN) 2.5 MG tablet Take 1 tablet (2.5 mg total) by mouth once a week. Every Wednesday  15 tablet  10  . nitroGLYCERIN (NITROSTAT) 0.4 MG SL tablet Place 0.4 mg under the tongue every 5 (five) minutes as needed for chest pain.      . simvastatin (ZOCOR) 20 MG tablet Take 1 tablet (20 mg total) by mouth at bedtime.  30  tablet  3  . spironolactone (ALDACTONE) 25 MG tablet Take 25 mg by mouth daily.      [name]. torsemide (DEMADEX) 20 MG tablet Take 2 tablets (40 mg total) by mouth 2 (two) times daily.  120 tablet  6   No current facility-administered medications for this encounter.      Filed Vitals:   08/05/13 1150  BP: 90/62  Pulse: 78  Weight: 137 lb 8 oz (62.37 kg)  SpO2: 94%   PHYSICAL EXAM: General: Chronically ill appearing.  No resp difficulty. SOB ambulating in the clinic.  HEENT:  normal Neck: supple. JVP 8-9 cm.  Carotids 2+ bilaterally; no bruits. No lymphadenopathy or thryomegaly appreciated. Cor: PMI laterally displaced. Tachycardic rate & regular rhythm; wide split S2 Lungs: Clear Abdomen: obese, soft, nontender, mildly distended.  No hepatosplenomegaly. No bruits or masses. Good bowel sounds. Extremities: no cyanosis, clubbing, rash.  No edema. Neuro: alert & orientedx3, cranial nerves grossly intact. Moves all 4 extremities w/o difficulty. Affect pleasant.   ASSESSMENT & PLAN: 1. Chronic systolic heart failure: Ischemic cardiomyopathy.  ECHO 12/20/2011 EF 15%,  S/P Biotronic ICD 2009.  QRS not wide so not CRT candidate. NYHA III, -Overall stable NYHA III. Volume status ok.  -Reviewed RHC and will start sildenafil for PAH.  - She is not interested in LVAD but would be open to the possibility of a cardiac transplantation.  She understands that she will have to quit smoking to consider this.  I am not sure she can make this happen. She is due for f/u echo. i reviewed previous echo today and RV moderately HK.  2.  CAD, S/P multiple interventions. No chest pain, continue ASA 81 ans statin 3.  Current smoker: Cigars. We discussed cessation.  If she is to be considered for transplant, will need to be off > 6 months.  Aundria Rud.  Ali B Cosgrove 08/05/2013  Patient seen and examined with Ulla PotashAli Cosgrove, NP. We discussed all aspects of the encounter. I agree with the assessment and plan as stated above.    Overall stable. Agree with starting sildenafil. Doubt she will ever meet criteria for transplant. Not interested in VAD or home milrinone. Continue close f/u. Due for echo.  Bevelyn Bucklesaniel R Shannie Kontos,MD 6:10 PM

## 2013-08-05 NOTE — Patient Instructions (Signed)
Will start revatio 20 mg three times a day.  Call any issues.  Follow up in 1 month  Will schedule an ECHO  Do the following things EVERYDAY: 1) Weigh yourself in the morning before breakfast. Write it down and keep it in a log. 2) Take your medicines as prescribed 3) Eat low salt foods-Limit salt (sodium) to 2000 mg per day.  4) Stay as active as you can everyday 5) Limit all fluids for the day to less than 2 liters 6)

## 2013-08-06 ENCOUNTER — Telehealth (HOSPITAL_COMMUNITY): Payer: Self-pay | Admitting: *Deleted

## 2013-08-06 NOTE — Telephone Encounter (Signed)
Received form from [facility] that sildenafil needed PA called [phone] and med was approved through medicare part D through 08/07/14, reference #[id number], [facility] aware and states it will cost pt $9.60 a month, pt aware and states she can afford that

## 2013-09-02 ENCOUNTER — Encounter: Payer: Self-pay | Admitting: Internal Medicine

## 2013-09-02 ENCOUNTER — Ambulatory Visit (INDEPENDENT_AMBULATORY_CARE_PROVIDER_SITE_OTHER): Payer: Medicare Other | Admitting: Internal Medicine

## 2013-09-02 ENCOUNTER — Encounter: Payer: Self-pay | Admitting: *Deleted

## 2013-09-02 VITALS — BP 104/72 | HR 83 | Ht 65.0 in | Wt 137.0 lb

## 2013-09-02 DIAGNOSIS — I472 Ventricular tachycardia: Secondary | ICD-10-CM

## 2013-09-02 DIAGNOSIS — Z95810 Presence of automatic (implantable) cardiac defibrillator: Secondary | ICD-10-CM

## 2013-09-02 DIAGNOSIS — I5022 Chronic systolic (congestive) heart failure: Secondary | ICD-10-CM

## 2013-09-02 DIAGNOSIS — I4729 Other ventricular tachycardia: Secondary | ICD-10-CM

## 2013-09-02 LAB — MDC_IDC_ENUM_SESS_TYPE_INCLINIC
Brady Statistic RV Percent Paced: 0 %
Implantable Pulse Generator Model: 540
Implantable Pulse Generator Serial Number: 60439356
Lead Channel Pacing Threshold Amplitude: 0.5 V
Lead Channel Pacing Threshold Pulse Width: 0.4 ms
Lead Channel Sensing Intrinsic Amplitude: 24.7 mV
MDC IDC MSMT LEADCHNL RV IMPEDANCE VALUE: 495 Ohm

## 2013-09-02 NOTE — Assessment & Plan Note (Signed)
Her biotronik ICD is working normally. Will recheck in several months.

## 2013-09-02 NOTE — Progress Notes (Signed)
HPI [name] returns today for followup. She is a very pleasant 53 year old woman with an ischemic cardiomyopathy, chronic systolic heart failure , status post ICD implantation almost 5 years ago.  She had an appropriate ICD shock for very rapid ventricular tachycardia but none in the past year. In the interim, she has been stable with class III heart failure. She denies chest pain. She has minimal peripheral edema. She has tried to adhere to a low-sodium diet. She denies medical noncompliance. No recent ICD discharges. She has developed pulmonary hypertension and is on Sildenafil. She does have some dizziness. Allergies  Allergen Reactions  . Nyquil Multi-Symptom [Pseudoeph-Doxylamine-Dm-Apap] Other (See Comments)    Fatigue, chest pressure and pain  . Sudafed [Pseudoephedrine] Other (See Comments)    Fatigue, chest pressure and pain  . Latex Rash  . Ramipril Cough     Current Outpatient Prescriptions  Medication Sig Dispense Refill  . albuterol (PROVENTIL) (2.5 MG/3ML) 0.083% nebulizer solution Take 2.5 mg by nebulization every 6 (six) hours as needed for wheezing or shortness of breath.      [name]. aspirin EC 81 MG tablet Take 1 tablet (81 mg total) by mouth daily.      . digoxin (LANOXIN) 0.25 MG tablet Take 0.25 mg by mouth daily.      [name]. KLOR-CON M20 20 MEQ tablet TAKE 2 TABLETS BY MOUTH DAILY. MAY TAKE EXTRA TAB IF YOU USE METOLAZONE  70 tablet  5  . losartan (COZAAR) 25 MG tablet Take 0.5 tablets (12.5 mg total) by mouth daily.  15 tablet  3  . Magnesium 200 MG TABS Take 1 tablet (200 mg total) by mouth daily.  30 each  3  . metolazone (ZAROXOLYN) 2.5 MG tablet Take 1 tablet (2.5 mg total) by mouth once a week. Every Wednesday  15 tablet  10  . nitroGLYCERIN (NITROSTAT) 0.4 MG SL tablet Place 0.4 mg under the tongue every 5 (five) minutes as needed for chest pain.      . sildenafil (REVATIO) 20 MG tablet Take 1 tablet (20 mg total) by mouth 3 (three) times daily.  90 tablet  6  .  simvastatin (ZOCOR) 20 MG tablet Take 1 tablet (20 mg total) by mouth at bedtime.  30 tablet  3  . spironolactone (ALDACTONE) 25 MG tablet Take 25 mg by mouth daily.      [name]. torsemide (DEMADEX) 20 MG tablet Take 2 tablets (40 mg total) by mouth 2 (two) times daily.  120 tablet  6   No current facility-administered medications for this visit.     Past Medical History  Diagnosis Date  . Chronic systolic heart failure     a.  secondary to severe ischemic cardiomyopathy with EF 20-25%;  b. s/p rimplantation of Biotronik single chamber ICD by Dr Ladona Ridgelaylor 02/2008;  c.  Right heart cath 04/2007: RA 18, PA 56/30 (40), PCWP 36. FICK 3.89/2.06 Woods;  d. echo 9/09: Ef 20-25%, mild MR, mild LAE  . CAD (coronary artery disease)     a. s/p multiple PCIs; b. cath 10/07: LAD stent 40%, dx 80% ISR - tx with POBA; OM stent ok; ostial OM 60%; RCA occluded with L-R collats  . Tobacco abuse   . Alcohol abuse   . History of noncompliance with medical treatment   . Diabetes mellitus, type 2   . Dizziness   . HLD (hyperlipidemia)   . DVT (deep venous thrombosis)     coumadin  . Arterial embolism  s/p right iliofemoral embolectomy with fasciotomy  . V-tach   . AICD (automatic cardioverter/defibrillator) present   . Angina   . Asthma   . Shortness of breath   . Sleep apnea   . CHF (congestive heart failure)   . GERD (gastroesophageal reflux disease)   . Anxiety   . Myocardial infarction   . COPD (chronic obstructive pulmonary disease)     ROS:   All systems reviewed and negative except as noted in the HPI.   Past Surgical History  Procedure Laterality Date  . Diagnostic laparoscopy    . Cardiac catheterization    . Insert / replace / remove pacemaker    . Tubal ligation       Family History  Problem Relation Age of Onset  . Coronary artery disease      FMILY HISTORY     History   Social History  . Marital Status: Divorced    Spouse Name: N/A    Number of Children: N/A  . Years  of Education: N/A   Occupational History  . disabled    Social History Main Topics  . Smoking status: Current Some Day Smoker -- 0.25 packs/day for 21 years    Types: Cigarettes  . Smokeless tobacco: Never Used  . Alcohol Use: No  . Drug Use: No  . Sexual Activity: No   Other Topics Concern  . Not on file   Social History Narrative  . No narrative on file     BP 104/72  Pulse 83  Ht 5\' 5"  (1.651 m)  Wt 137 lb (62.143 kg)  BMI 22.80 kg/m2  Physical Exam:  stable appearing 53 year old woman,NAD HEENT: Unremarkable Neck:  7 cm JVD, no thyromegally Back:  No CVA tenderness Lungs:  Clear with no wheezes, rales, or rhonchi. HEART:  Regular rate rhythm, no murmurs, no rubs, no clicks Abd:  soft, positive bowel sounds, no organomegally, no rebound, no guarding Ext:  2 plus pulses, no edema, no cyanosis, no clubbing Skin:  No rashes no nodules Neuro:  CN II through XII intact, motor grossly intact  DEVICE  Normal device function.  See PaceArt for details.   Assess/Plan:

## 2013-09-02 NOTE — Progress Notes (Signed)
Patient ID: Jessica MoccasinSherita [name], female   DOB: 1960-09-11, 53 y.o.   MRN: [id number] PCP: Dr Dierdre SearlesLi EP: Dr Ladona Ridgelaylor  HPI: [name] is a 53y.o. with a history of ICM, chronic systolic heart failure EF 25%,  CAD, S/P multiple interventions, ICD Biotronic, DM, , hyperlipidemia, former smoker, S/P right iliofemoral embolectomy and compartment fasciotomy of the right leg by Dr. Darrick PennaFields with LLE DVT 06/2010.  S/P ICD discharge 03/13/13.    She returns for follow up. Last visit sildenafil started.  Weight at home 134-135 pounds. Breathing better. Mild dyspnea with exertion. Denies PND/Orthopnea. No shocks. Does report dizziness after she takes sildenafil. Denies presyncope/syncope.   Denies bendopnea. No using CPAP. Taking all medications. Smoking 1-2 cigars a week.   RHC 07/23/2013: RA 15, RV 78/5/18, PA 79/31 (47), PCWP 12, Fick CO/CI 2.1/1.3, Thermo CO/CI 3.4/2.0, PVR 16.5, PA 39% and 40%  ECHO 02/22/11: Only basal function preserved. EF 20-25%. ECHO 12/20/2011 EF 15%   03/13/13 K 3.8 Creatinine 0.90 03/17/13 K 4.7 Creatinine 0.94  Magnesium 1.6 Started on magnesium 4/15 K 3.7 => 4.6, Creatinine 0.89 => 1.23 Pro BNP 1848  07/14/2013: K 4.1, creatinine 0.88, digoxin 1.0, Pro BNP 2130 08/05/13 Dig 0.5   ROS: All systems negative except as listed in HPI, PMH and Problem List.  SH: smokes cigar daily, Drinks occasional beer; lives [place] 2 children [place].   FH: Mom living: DM2, HTN, CKD  Father deceased: MI, CAD, HTN   Past Medical History  Diagnosis Date  . Chronic systolic heart failure     a.  secondary to severe ischemic cardiomyopathy with EF 20-25%;  b. s/p rimplantation of Biotronik single chamber ICD by Dr Ladona Ridgelaylor 02/2008;  c.  Right heart cath 04/2007: RA 18, PA 56/30 (40), PCWP 36. FICK 3.89/2.06 Woods;  d. echo 9/09: Ef 20-25%, mild MR, mild LAE  . CAD (coronary artery disease)     a. s/p multiple PCIs; b. cath 10/07: LAD stent 40%, dx 80% ISR - tx with POBA; OM stent ok; ostial OM 60%;  RCA occluded with L-R collats  . Tobacco abuse   . Alcohol abuse   . History of noncompliance with medical treatment   . Diabetes mellitus, type 2   . Dizziness   . HLD (hyperlipidemia)   . DVT (deep venous thrombosis)     coumadin  . Arterial embolism     s/p right iliofemoral embolectomy with fasciotomy  . V-tach   . AICD (automatic cardioverter/defibrillator) present   . Angina   . Asthma   . Shortness of breath   . Sleep apnea   . CHF (congestive heart failure)   . GERD (gastroesophageal reflux disease)   . Anxiety   . Myocardial infarction   . COPD (chronic obstructive pulmonary disease)     Current Outpatient Prescriptions  Medication Sig Dispense Refill  . albuterol (PROVENTIL) (2.5 MG/3ML) 0.083% nebulizer solution Take 2.5 mg by nebulization every 6 (six) hours as needed for wheezing or shortness of breath.      [name]. aspirin EC 81 MG tablet Take 1 tablet (81 mg total) by mouth daily.      . digoxin (LANOXIN) 0.25 MG tablet Take 0.25 mg by mouth daily.      [name]. KLOR-CON M20 20 MEQ tablet TAKE 2 TABLETS BY MOUTH DAILY. MAY TAKE EXTRA TAB IF YOU USE METOLAZONE  70 tablet  5  . losartan (COZAAR) 25 MG tablet Take 0.5 tablets (12.5 mg total) by mouth daily.  15 tablet  3  . Magnesium 200 MG TABS Take 1 tablet (200 mg total) by mouth daily.  30 each  3  . metolazone (ZAROXOLYN) 2.5 MG tablet Take 1 tablet (2.5 mg total) by mouth once a week. Every Wednesday  15 tablet  10  . sildenafil (REVATIO) 20 MG tablet Take 1 tablet (20 mg total) by mouth 3 (three) times daily.  90 tablet  6  . simvastatin (ZOCOR) 20 MG tablet Take 1 tablet (20 mg total) by mouth at bedtime.  30 tablet  3  . torsemide (DEMADEX) 20 MG tablet Take 2 tablets (40 mg total) by mouth 2 (two) times daily.  120 tablet  6  . nitroGLYCERIN (NITROSTAT) 0.4 MG SL tablet Place 0.4 mg under the tongue every 5 (five) minutes as needed for chest pain.      [name]. spironolactone (ALDACTONE) 25 MG tablet Take 25 mg by mouth daily.        No current facility-administered medications for this encounter.      Filed Vitals:   09/03/13 1017  BP: 94/64  Pulse: 71  Resp: 18  Weight: 136 lb 6 oz (61.859 kg)  SpO2: 94%   PHYSICAL EXAM: General: Chronically ill appearing.  No resp difficulty. Ambulated in clinic without difficulty. Ambulated around clinic and maintained oxygen saturations > 93%.   HEENT:  normal Neck: supple. JVP 7-8 cm.  Carotids 2+ bilaterally; no bruits. No lymphadenopathy or thryomegaly appreciated. Cor: PMI laterally displaced. Regular rate & regular rhythm; wide split S2 Lungs: Clear Abdomen: obese, soft, nontender, non distended.  No hepatosplenomegaly. No bruits or masses. Good bowel sounds. Extremities: no cyanosis, clubbing, rash.  No edema. Neuro: alert & orientedx3, cranial nerves grossly intact. Moves all 4 extremities w/o difficulty. Affect pleasant.   ASSESSMENT & PLAN: 1. Chronic systolic heart failure: Ischemic cardiomyopathy.  ECHO 12/20/2011 EF 15%,  S/P Biotronic ICD 2009.  QRS not wide so not CRT candidate.-Overall stable NYHA II-III. Volume status ok. Continue trosemide 40 mg tiwce a day and restart 25 mg spironolactone daily .She has had problems with hypokalemia in the past.  BP low will stop losartan.  No bb due to low blood pressure. Continue digoxin 0.125 mg daily last dig level 0.5  - She is not interested in LVAD but would be open to the possibility of a cardiac transplantation.  She understands that she will have to quit smoking to consider this.   Check BMET todayRepeat ECHO pending from today.  2.  CAD, S/P multiple interventions. No chest pain, continue ASA 81 and statin 3.  Current smoker: Cigars. We discussed cessation.  She understands she is not a candidate for transplant if she continues.   4. Pulmonary HTN-On Revatio 20 mg tid. Ambulated in clinic with oxygen saturations remaining > 93%. Encouraged to use CPAP nightly. Referred to pulmonary.  5. OSA- Has CPAP but  not sure about settings and hasn't used in several years. She needs to use given new diagnosis of pulmonary HTN.  . Follow up in 1 month . Plan to check 6MW.  CLEGG,AMY 09/03/2013

## 2013-09-02 NOTE — Patient Instructions (Signed)
Your physician wants you to follow-up in: 1 YEAR WITH DR. TAYLOR...You will receive a reminder letter in the mail two months in advance. If you don't receive a letter, please call our office to schedule the follow-up appointment.  

## 2013-09-02 NOTE — Assessment & Plan Note (Signed)
Her symptoms are class 3. She will followup with Dr. Dorthea CoveB. I asked her to reduce her sodium intake. No change in her CHF meds.

## 2013-09-02 NOTE — Assessment & Plan Note (Signed)
She has had no recurrent ventricular arrhythmias. No change in medical therapy. 

## 2013-09-03 ENCOUNTER — Ambulatory Visit (HOSPITAL_COMMUNITY)
Admission: RE | Admit: 2013-09-03 | Discharge: 2013-09-03 | Disposition: A | Discharge status: Home / Self Care | Payer: Medicare Other | Source: Ambulatory Visit | Attending: Internal Medicine | Admitting: Internal Medicine

## 2013-09-03 ENCOUNTER — Ambulatory Visit (HOSPITAL_BASED_OUTPATIENT_CLINIC_OR_DEPARTMENT_OTHER)
Admission: RE | Admit: 2013-09-03 | Discharge: 2013-09-03 | Disposition: A | Discharge status: Home / Self Care | Payer: Medicare Other | Source: Ambulatory Visit | Attending: Internal Medicine | Admitting: Internal Medicine

## 2013-09-03 VITALS — BP 94/64 | HR 71 | Resp 18 | Wt 136.4 lb

## 2013-09-03 DIAGNOSIS — I059 Rheumatic mitral valve disease, unspecified: Secondary | ICD-10-CM

## 2013-09-03 DIAGNOSIS — I2789 Other specified pulmonary heart diseases: Secondary | ICD-10-CM

## 2013-09-03 DIAGNOSIS — G4733 Obstructive sleep apnea (adult) (pediatric): Secondary | ICD-10-CM

## 2013-09-03 DIAGNOSIS — F17200 Nicotine dependence, unspecified, uncomplicated: Secondary | ICD-10-CM

## 2013-09-03 DIAGNOSIS — I509 Heart failure, unspecified: Secondary | ICD-10-CM | POA: Insufficient documentation

## 2013-09-03 DIAGNOSIS — I5022 Chronic systolic (congestive) heart failure: Secondary | ICD-10-CM

## 2013-09-03 DIAGNOSIS — I2510 Atherosclerotic heart disease of native coronary artery without angina pectoris: Secondary | ICD-10-CM

## 2013-09-03 DIAGNOSIS — I272 Pulmonary hypertension, unspecified: Secondary | ICD-10-CM | POA: Insufficient documentation

## 2013-09-03 LAB — BASIC METABOLIC PANEL
BUN: 38 mg/dL — ABNORMAL HIGH (ref 6–23)
CO2: 31 meq/L (ref 19–32)
Calcium: 10.1 mg/dL (ref 8.4–10.5)
Chloride: 88 mEq/L — ABNORMAL LOW (ref 96–112)
Creatinine, Ser: 1.23 mg/dL — ABNORMAL HIGH (ref 0.50–1.10)
GFR calc Af Amer: 56 mL/min — ABNORMAL LOW (ref >90)
GFR calc non Af Amer: 48 mL/min — ABNORMAL LOW (ref >90)
Glucose, Bld: 173 mg/dL — ABNORMAL HIGH (ref 70–99)
Potassium: 3.8 mEq/L (ref 3.7–5.3)
SODIUM: 135 meq/L — AB (ref 137–147)

## 2013-09-03 MED ORDER — SPIRONOLACTONE 25 MG PO TABS: 25.0000 mg | ORAL_TABLET | Freq: Every day | ORAL | Status: DC

## 2013-09-03 NOTE — Patient Instructions (Addendum)
Follow up in 1 month with 6 minute walk   Stop Losartan   Please use CPAP  We will refer you to Dr Henrene PastorMcQuiad  Do the following things EVERYDAY: 1) Weigh yourself in the morning before breakfast. Write it down and keep it in a log. 2) Take your medicines as prescribed 3) Eat low salt foods-Limit salt (sodium) to 2000 mg per day.  4) Stay as active as you can everyday 5) Limit all fluids for the day to less than 2 liters

## 2013-09-03 NOTE — Progress Notes (Signed)
  Echocardiogram 2D Echocardiogram has been performed.  Georgian CoWILLIAMS, LAUREN 09/03/2013, 9:44 AM

## 2013-09-05 ENCOUNTER — Other Ambulatory Visit (HOSPITAL_COMMUNITY): Payer: Self-pay | Admitting: Internal Medicine

## 2013-09-28 ENCOUNTER — Encounter: Payer: Self-pay | Admitting: Pulmonary Disease

## 2013-09-28 ENCOUNTER — Ambulatory Visit (INDEPENDENT_AMBULATORY_CARE_PROVIDER_SITE_OTHER): Payer: Medicare Other | Admitting: Pulmonary Disease

## 2013-09-28 VITALS — BP 124/66 | HR 85 | Ht 65.0 in | Wt 134.0 lb

## 2013-09-28 DIAGNOSIS — R0602 Shortness of breath: Secondary | ICD-10-CM

## 2013-09-28 DIAGNOSIS — G4733 Obstructive sleep apnea (adult) (pediatric): Secondary | ICD-10-CM

## 2013-09-28 DIAGNOSIS — I272 Pulmonary hypertension, unspecified: Secondary | ICD-10-CM

## 2013-09-28 NOTE — Assessment & Plan Note (Addendum)
Jessica [name] is right heart catheterization from May of 2015 demonstrated a markedly elevated pulmonary vascular resistance in the setting of a normal wedge pressure. Clearly, her systolic heart failure contributes to her overall shortness of breath and fatigue. However, I agree with the cardiologist that she appears to have disproportionate pulmonary vascular resistance. She seems to have improved with the  sildenafil.  It does not sound like she has a past medical history significant for a connective tissue disease. However, explained to her that her history of DVT and untreated sleep apnea need to be investigated further as these can certainly contribute to pulmonary hypertension. At some point she likely needs to have a serologic workup to look for connective tissues and HIV. I noted that she has a normal TSH recently.  We also need to get pulmonary function testing in order to see the severity of her COPD as this can certainly contribute to pulmonary hypertension.  Plan:  -Full PFT -V/Q scan to look for chronic pulmonary emboli (though CT from 06/2013 not suggestive) -overnight oximetry test

## 2013-09-28 NOTE — Progress Notes (Signed)
Subjective:    Patient ID: Jessica [name], female    DOB: 09-21-1960, 53 y.o.   MRN: [id number]  HPI  This is a very pleasant 53 year old female who comes to my clinic today for evaluation of pulmonary hypertension. She has smoked cigarettes for the majority of her life but tells me that she quit smoking 3 years ago. Since then she has used cigars occasionally. It sounds like she smoked at least one pack of cigarettes daily from her teenage years until her early 110s.  She has been told in the past that she has COPD but she does not recall having lung function testing. She has only been treated with albuterol off and on for this condition. She tells me that she gets dyspneic after walking approximately 1000 feet on level ground. She says it is worse in hot weather and she does occasionally wheeze with her shortness of breath. Her shortness of breath is steadily worse on days when she is "holding onto more fluid". She has learned over the years to adjust her Lasix dosing to help with this. She occasionally gets ankle swelling but this is rare. She says that typically when she becomes volume overloaded she can feel fluid collecting in her abdomen.  She has a rare cough and hardly ever produces mucus. She has never been treated with any inhaled therapies other than albuterol.  Recently, she had a right heart catheterization which showed a markedly elevated pulmonary vascular resistance in the setting of a preserved wedge pressure. She was started on sildenafil which she says has been associated with improved dyspnea. She says now she can bend over without feeling quite short of breath and she can do walk further on level ground.  She has a past medical history significant for both arterial and venous clots and has been treated with lifelong warfarin. She is not aware of ever having a pulmonary embolism. In April of 2015 she had a CT scan of the chest which did not show a pulmonary embolism.   Past  Medical History  Diagnosis Date  . Chronic systolic heart failure     a.  secondary to severe ischemic cardiomyopathy with EF 20-25%;  b. s/p rimplantation of Biotronik single chamber ICD by Dr Ladona Ridgel 02/2008;  c.  Right heart cath 04/2007: RA 18, PA 56/30 (40), PCWP 36. FICK 3.89/2.06 [place];  d. echo 9/09: Ef 20-25%, mild MR, mild LAE  . CAD (coronary artery disease)     a. s/p multiple PCIs; b. cath 10/07: LAD stent 40%, dx 80% ISR - tx with POBA; OM stent ok; ostial OM 60%; RCA occluded with L-R collats  . Tobacco abuse   . Alcohol abuse   . History of noncompliance with medical treatment   . Diabetes mellitus, type 2   . Dizziness   . HLD (hyperlipidemia)   . DVT (deep venous thrombosis)     coumadin  . Arterial embolism     s/p right iliofemoral embolectomy with fasciotomy  . V-tach   . AICD (automatic cardioverter/defibrillator) present   . Angina   . Asthma   . Shortness of breath   . Sleep apnea   . CHF (congestive heart failure)   . GERD (gastroesophageal reflux disease)   . Anxiety   . Myocardial infarction   . COPD (chronic obstructive pulmonary disease)      Family History  Problem Relation Age of Onset  . Coronary artery disease      FMILY HISTORY  History   Social History  . Marital Status: Divorced    Spouse Name: N/A    Number of Children: N/A  . Years of Education: N/A   Occupational History  . disabled    Social History Main Topics  . Smoking status: Current Some Day Smoker -- 0.25 packs/day for 21 years    Types: Cigars  . Smokeless tobacco: Never Used     Comment: Smokes 2-3 cigars daily, quit smoking cigarettes 3 yrs ago.   . Alcohol Use: No  . Drug Use: No  . Sexual Activity: No   Other Topics Concern  . Not on file   Social History Narrative  . No narrative on file     Allergies  Allergen Reactions  . Nyquil Multi-Symptom [Pseudoeph-Doxylamine-Dm-Apap] Other (See Comments)    Fatigue, chest pressure and pain  . Sudafed  [Pseudoephedrine] Other (See Comments)    Fatigue, chest pressure and pain  . Latex Rash  . Ramipril Cough     Outpatient Prescriptions Prior to Visit  Medication Sig Dispense Refill  . albuterol (PROVENTIL) (2.5 MG/3ML) 0.083% nebulizer solution Take 2.5 mg by nebulization every 6 (six) hours as needed for wheezing or shortness of breath.      [name]. aspirin EC 81 MG tablet Take 1 tablet (81 mg total) by mouth daily.      [name]. DIGOX 250 MCG tablet TAKE 1 TABLET (0.25 MG TOTAL) BY MOUTH DAILY.  30 tablet  5  . KLOR-CON M20 20 MEQ tablet TAKE 2 TABLETS BY MOUTH DAILY. MAY TAKE EXTRA TAB IF YOU USE METOLAZONE  70 tablet  5  . Magnesium 200 MG TABS Take 1 tablet (200 mg total) by mouth daily.  30 each  3  . metolazone (ZAROXOLYN) 2.5 MG tablet Take 1 tablet (2.5 mg total) by mouth once a week. Every Wednesday  15 tablet  10  . nitroGLYCERIN (NITROSTAT) 0.4 MG SL tablet Place 0.4 mg under the tongue every 5 (five) minutes as needed for chest pain.      . sildenafil (REVATIO) 20 MG tablet Take 1 tablet (20 mg total) by mouth 3 (three) times daily.  90 tablet  6  . simvastatin (ZOCOR) 20 MG tablet Take 1 tablet (20 mg total) by mouth at bedtime.  30 tablet  3  . spironolactone (ALDACTONE) 25 MG tablet Take 1 tablet (25 mg total) by mouth daily.  30 tablet  6  . torsemide (DEMADEX) 20 MG tablet Take 2 tablets (40 mg total) by mouth 2 (two) times daily.  120 tablet  6   No facility-administered medications prior to visit.      Review of Systems  Constitutional: Negative for fever, chills, diaphoresis, activity change, appetite change, fatigue and unexpected weight change.  HENT: Positive for dental problem. Negative for congestion, ear discharge, ear pain, facial swelling, hearing loss, mouth sores, nosebleeds, postnasal drip, rhinorrhea, sinus pressure, sneezing, sore throat, tinnitus, trouble swallowing and voice change.   Eyes: Negative for photophobia, discharge, itching and visual disturbance.    Respiratory: Positive for shortness of breath. Negative for apnea, cough, choking, chest tightness, wheezing and stridor.   Cardiovascular: Negative for chest pain, palpitations and leg swelling.  Gastrointestinal: Negative for nausea, vomiting, abdominal pain, constipation, blood in stool and abdominal distention.  Genitourinary: Negative for dysuria, urgency, frequency, hematuria, flank pain, decreased urine volume and difficulty urinating.  Musculoskeletal: Negative for arthralgias, back pain, gait problem, joint swelling, myalgias, neck pain and neck stiffness.  Skin: Negative for  color change, pallor and rash.  Neurological: Negative for dizziness, tremors, seizures, syncope, speech difficulty, weakness, light-headedness, numbness and headaches.  Hematological: Negative for adenopathy. Does not bruise/bleed easily.  Psychiatric/Behavioral: Positive for dysphoric mood. Negative for confusion, sleep disturbance and agitation. The patient is nervous/anxious.        Objective:   Physical Exam  Filed Vitals:   09/28/13 1132  BP: 124/66  Pulse: 85  Height: 5\' 5"  (1.651 m)  Weight: 134 lb (60.782 kg)  SpO2: 100%  RA  Gen: well appearing, no acute distress HEENT: NCAT, PERRL, EOMi, OP clear, neck supple without masses PULM: Scant crackles in bases, otherwise clear CV: RRR, systolic murmur noted, no JVD AB: BS+, soft, nontender, no hsm Ext: warm, no edema, no clubbing, no cyanosis Derm: no rash or skin breakdown Neuro: A&Ox4, CN II-XII intact, strength 5/5 in all 4 extremities  06/2013 CT chest > no PE, scattered ground glass with a "crazy paving" type pattern most predominant in the bases, no pleural effusion  RHC 07/23/2013: RA 15, RV 78/5/18, PA 79/31 (47), PCWP 12, Fick CO/CI 2.1/1.3, Thermo CO/CI 3.4/2.0, PVR 16.5, PA 39% and 40%     Assessment & Plan:   Pulmonary HTN Jessica [name] is right heart catheterization from May of 2015 demonstrated a markedly elevated pulmonary  vascular resistance in the setting of a normal wedge pressure. Clearly, her systolic heart failure contributes to her overall shortness of breath and fatigue. However, I agree with the cardiologist that she appears to have disproportionate pulmonary vascular resistance. She seems to have improved with the  sildenafil.  It does not sound like she has a past medical history significant for a connective tissue disease. However, explained to her that her history of DVT and untreated sleep apnea need to be investigated further as these can certainly contribute to pulmonary hypertension. At some point she likely needs to have a serologic workup to look for connective tissues and HIV. I noted that she has a normal TSH recently.  We also need to get pulmonary function testing in order to see the severity of her COPD as this can certainly contribute to pulmonary hypertension.  Plan:  -Full PFT -V/Q scan to look for chronic pulmonary emboli (though CT from 06/2013 not suggestive) -overnight oximetry test  OSA (obstructive sleep apnea) She has lost a significant amount of weight since her polysomnogram in 2007 when she was diagnosed with obstructive sleep apnea. Because of this, it is possible that the sleep apnea may have resolved.  Plan: -Start with a overnight oximetry testing, if that is abnormal then we will repeat a polysomnogram    Updated Medication List Outpatient Encounter Prescriptions as of 09/28/2013  Medication Sig  . albuterol (PROVENTIL) (2.5 MG/3ML) 0.083% nebulizer solution Take 2.5 mg by nebulization every 6 (six) hours as needed for wheezing or shortness of breath.  [name] aspirin EC 81 MG tablet Take 1 tablet (81 mg total) by mouth daily.  [name] DIGOX 250 MCG tablet TAKE 1 TABLET (0.25 MG TOTAL) BY MOUTH DAILY.  [name] KLOR-CON M20 20 MEQ tablet TAKE 2 TABLETS BY MOUTH DAILY. MAY TAKE EXTRA TAB IF YOU USE METOLAZONE  . Magnesium 200 MG TABS Take 1 tablet (200 mg total) by mouth daily.  .  metolazone (ZAROXOLYN) 2.5 MG tablet Take 1 tablet (2.5 mg total) by mouth once a week. Every Wednesday  . nitroGLYCERIN (NITROSTAT) 0.4 MG SL tablet Place 0.4 mg under the tongue every 5 (five) minutes as needed for chest  pain.  . sildenafil (REVATIO) 20 MG tablet Take 1 tablet (20 mg total) by mouth 3 (three) times daily.  . simvastatin (ZOCOR) 20 MG tablet Take 1 tablet (20 mg total) by mouth at bedtime.  [name] spironolactone (ALDACTONE) 25 MG tablet Take 1 tablet (25 mg total) by mouth daily.  [name] torsemide (DEMADEX) 20 MG tablet Take 2 tablets (40 mg total) by mouth 2 (two) times daily.

## 2013-09-28 NOTE — Patient Instructions (Signed)
We will arrange a pulmonary function test, overnight oximetry test, V/Q scan, and see you back in 4 weeks

## 2013-09-28 NOTE — Assessment & Plan Note (Signed)
She has lost a significant amount of weight since her polysomnogram in 2007 when she was diagnosed with obstructive sleep apnea. Because of this, it is possible that the sleep apnea may have resolved.  Plan: -Start with a overnight oximetry testing, if that is abnormal then we will repeat a polysomnogram

## 2013-09-30 ENCOUNTER — Encounter: Payer: Self-pay | Admitting: Pulmonary Disease

## 2013-09-30 ENCOUNTER — Encounter (HOSPITAL_COMMUNITY): Payer: Self-pay

## 2013-09-30 ENCOUNTER — Ambulatory Visit (HOSPITAL_COMMUNITY)
Admission: RE | Admit: 2013-09-30 | Discharge: 2013-09-30 | Disposition: A | Discharge status: Home / Self Care | Payer: Medicare Other | Source: Ambulatory Visit | Attending: Cardiology | Admitting: Cardiology

## 2013-09-30 ENCOUNTER — Ambulatory Visit (HOSPITAL_COMMUNITY)
Admission: RE | Admit: 2013-09-30 | Discharge: 2013-09-30 | Disposition: A | Discharge status: Home / Self Care | Payer: Medicare Other | Source: Ambulatory Visit | Attending: Pulmonary Disease | Admitting: Pulmonary Disease

## 2013-09-30 VITALS — BP 86/54 | HR 90 | Wt 135.4 lb

## 2013-09-30 DIAGNOSIS — K219 Gastro-esophageal reflux disease without esophagitis: Secondary | ICD-10-CM | POA: Insufficient documentation

## 2013-09-30 DIAGNOSIS — Z09 Encounter for follow-up examination after completed treatment for conditions other than malignant neoplasm: Secondary | ICD-10-CM | POA: Diagnosis not present

## 2013-09-30 DIAGNOSIS — I2589 Other forms of chronic ischemic heart disease: Secondary | ICD-10-CM | POA: Diagnosis not present

## 2013-09-30 DIAGNOSIS — Z7982 Long term (current) use of aspirin: Secondary | ICD-10-CM | POA: Diagnosis not present

## 2013-09-30 DIAGNOSIS — J4489 Other specified chronic obstructive pulmonary disease: Secondary | ICD-10-CM | POA: Insufficient documentation

## 2013-09-30 DIAGNOSIS — I252 Old myocardial infarction: Secondary | ICD-10-CM | POA: Diagnosis not present

## 2013-09-30 DIAGNOSIS — I2789 Other specified pulmonary heart diseases: Secondary | ICD-10-CM

## 2013-09-30 DIAGNOSIS — E119 Type 2 diabetes mellitus without complications: Secondary | ICD-10-CM | POA: Diagnosis not present

## 2013-09-30 DIAGNOSIS — F17200 Nicotine dependence, unspecified, uncomplicated: Secondary | ICD-10-CM | POA: Diagnosis not present

## 2013-09-30 DIAGNOSIS — Z9861 Coronary angioplasty status: Secondary | ICD-10-CM | POA: Insufficient documentation

## 2013-09-30 DIAGNOSIS — R0602 Shortness of breath: Secondary | ICD-10-CM

## 2013-09-30 DIAGNOSIS — I2510 Atherosclerotic heart disease of native coronary artery without angina pectoris: Secondary | ICD-10-CM | POA: Diagnosis not present

## 2013-09-30 DIAGNOSIS — I272 Pulmonary hypertension, unspecified: Secondary | ICD-10-CM

## 2013-09-30 DIAGNOSIS — G4733 Obstructive sleep apnea (adult) (pediatric): Secondary | ICD-10-CM | POA: Diagnosis not present

## 2013-09-30 DIAGNOSIS — J449 Chronic obstructive pulmonary disease, unspecified: Secondary | ICD-10-CM | POA: Diagnosis not present

## 2013-09-30 DIAGNOSIS — Z86718 Personal history of other venous thrombosis and embolism: Secondary | ICD-10-CM | POA: Diagnosis not present

## 2013-09-30 DIAGNOSIS — Z79899 Other long term (current) drug therapy: Secondary | ICD-10-CM | POA: Insufficient documentation

## 2013-09-30 DIAGNOSIS — E785 Hyperlipidemia, unspecified: Secondary | ICD-10-CM | POA: Insufficient documentation

## 2013-09-30 DIAGNOSIS — I5022 Chronic systolic (congestive) heart failure: Secondary | ICD-10-CM | POA: Diagnosis not present

## 2013-09-30 LAB — RHEUMATOID FACTOR: Rhuematoid fact SerPl-aCnc: 319 IU/mL — ABNORMAL HIGH (ref <=14)

## 2013-09-30 LAB — HIV ANTIBODY (ROUTINE TESTING W REFLEX): HIV 1&2 Ab, 4th Generation: NONREACTIVE

## 2013-09-30 MED ORDER — ALBUTEROL SULFATE (2.5 MG/3ML) 0.083% IN NEBU
2.5000 mg | INHALATION_SOLUTION | Freq: Once | RESPIRATORY_TRACT | Status: AC
  Administered 2013-09-30: 2.5 mg via RESPIRATORY_TRACT

## 2013-09-30 MED ORDER — SPIRONOLACTONE 25 MG PO TABS: 12.5000 mg | ORAL_TABLET | Freq: Every day | ORAL | Status: DC

## 2013-09-30 NOTE — Progress Notes (Signed)
6 min walk test completed, pt ambulated 1260 ft (384 m) on RA sats ranged from 88-96%, HR 88-111

## 2013-09-30 NOTE — Patient Instructions (Signed)
Decrease Spironolactone to 1/2 tab daily  Labs today  Your physician recommends that you schedule a follow-up appointment in: 2 months

## 2013-09-30 NOTE — Progress Notes (Signed)
Quick Note:  Pt is aware of results and recs. Nothing further needed at this time. ______ 

## 2013-10-01 LAB — ANA: Anti Nuclear Antibody(ANA): NEGATIVE

## 2013-10-01 LAB — ANTI-SCLERODERMA ANTIBODY: SCLERODERMA (SCL-70) (ENA) ANTIBODY, IGG: NEGATIVE

## 2013-10-01 NOTE — Progress Notes (Signed)
Patient ID: Jessica MoccasinSherita [name], female   DOB: [birth year], 53 y.o.   MRN: [id number] PCP: Dr Dierdre SearlesLi EP: Dr Ladona Ridgelaylor  HPI: Jessica BurkeSherita is a 53y.o. with a history of ICM, chronic systolic heart failure EF 25%,  CAD, S/P multiple interventions, ICD Biotronic, DM, , hyperlipidemia, former smoker, S/P right iliofemoral embolectomy and compartment fasciotomy of the right leg by Dr. Darrick PennaFields with LLE DVT 06/2010.  S/P ICD discharge 03/13/13.    She returns for follow up. She feels like she has improved since starting on sildenafil.  BP is low today and she feels more fatigued than normal.  No marked lightheadedness or syncope.  She is walking farther and working in her garden.  She was able to walk 2 miles with one rest recently.  Denies PND/Orthopnea. No shocks. Does report dizziness after she takes sildenafil. Denies presyncope/syncope.   Denies bendopnea. No using CPAP. Taking all medications. Smoking 1-2 cigars a day.   She has [facility] Dr[facility] Kend[facility]McQuaid [facility].  PFTs were done in 7/15 suggesting an interstitial process (restrictive) with FVC 74%%, FEV1 81%, ratio 107%, TLC 76%, DLCO 29%. CTA chest in 4/15 showed no PE but did have focal honeycomb changes in the lung bases.    RHC 07/23/2013: RA 15, RV 78/5/18, PA 79/31 (47), PCWP 12, Fick CO/CI 2.1/1.3, Thermo CO/CI 3.4/2.0, PVR 16.5, PA 39% and 40%  ECHO 02/22/11: Only basal function preserved. EF 20-25%. ECHO 12/20/2011 EF 15%  ECHO 6/15 EF 25% with diffuse hypokinesis, severe LV dilation, mild MR, RV mildly dilated with moderately decreased systolic function.   6 minute walk (7/15): 384 m  Labs: 03/13/13 K 3.8 Creatinine 0.90 03/17/13 K 4.7 Creatinine 0.94  Magnesium 1.6 Started on magnesium 4/15 K 3.7 => 4.6, Creatinine 0.89 => 1.23 Pro BNP 1848  07/14/2013: K 4.1, creatinine 0.88, digoxin 1.0, Pro BNP 2130 08/05/13 Dig 0.5  6/15 K 3.8, creatinine 1.2, digoxin 0.5, HCT 43.6  ROS: All systems negative except as listed in HPI, PMH and Problem List.  SH:  smokes cigar daily, Drinks occasional beer; lives [place] boyfriend [place] 2 children in [place].   FH: Mom living: DM2, HTN, CKD  Father deceased: MI, CAD, HTN   Past Medical History  Diagnosis Date  . Chronic systolic heart failure     a.  secondary to severe ischemic cardiomyopathy with EF 20-25%;  b. s/p rimplantation of Biotronik single chamber ICD by Dr Ladona Ridgelaylor 02/2008;  c.  Right heart cath 04/2007: RA 18, PA 56/30 (40), PCWP 36. FICK 3.89/2.06 [place];  d. echo 9/09: Ef 20-25%, mild MR, mild LAE  . CAD (coronary artery disease)     a. s/p multiple PCIs; b. cath 10/07: LAD stent 40%, dx 80% ISR - tx with POBA; OM stent ok; ostial OM 60%; RCA occluded with L-R collats  . Tobacco abuse   . Alcohol abuse   . History of noncompliance with medical treatment   . Diabetes mellitus, type 2   . Dizziness   . HLD (hyperlipidemia)   . DVT (deep venous thrombosis)     coumadin  . Arterial embolism     s/p right iliofemoral embolectomy with fasciotomy  . V-tach   . AICD (automatic cardioverter/defibrillator) present   . Angina   . Asthma   . Shortness of breath   . Sleep apnea   . CHF (congestive heart failure)   . GERD (gastroesophageal reflux disease)   . Anxiety   . Myocardial infarction   .  COPD (chronic obstructive pulmonary disease)     Current Outpatient Prescriptions  Medication Sig Dispense Refill  . albuterol (PROVENTIL) (2.5 MG/3ML) 0.083% nebulizer solution Take 2.5 mg by nebulization every 6 (six) hours as needed for wheezing or shortness of breath.      [name] aspirin EC 81 MG tablet Take 1 tablet (81 mg total) by mouth daily.      [name] DIGOX 250 MCG tablet TAKE 1 TABLET (0.25 MG TOTAL) BY MOUTH DAILY.  30 tablet  5  . KLOR-CON M20 20 MEQ tablet TAKE 2 TABLETS BY MOUTH DAILY. MAY TAKE EXTRA TAB IF YOU USE METOLAZONE  70 tablet  5  . Magnesium 200 MG TABS Take 1 tablet (200 mg total) by mouth daily.  30 each  3  . metolazone (ZAROXOLYN) 2.5 MG tablet Take 1 tablet (2.5 mg total) by  mouth once a week. Every Wednesday  15 tablet  10  . nitroGLYCERIN (NITROSTAT) 0.4 MG SL tablet Place 0.4 mg under the tongue every 5 (five) minutes as needed for chest pain.      . sildenafil (REVATIO) 20 MG tablet Take 1 tablet (20 mg total) by mouth 3 (three) times daily.  90 tablet  6  . simvastatin (ZOCOR) 20 MG tablet Take 1 tablet (20 mg total) by mouth at bedtime.  30 tablet  3  . spironolactone (ALDACTONE) 25 MG tablet Take 0.5 tablets (12.5 mg total) by mouth daily.  30 tablet  6  . torsemide (DEMADEX) 20 MG tablet Take 2 tablets (40 mg total) by mouth 2 (two) times daily.  120 tablet  6   No current facility-administered medications for this encounter.      Filed Vitals:   09/30/13 1120  BP: 86/54  Pulse: 90  Weight: 135 lb 6.4 oz (61.417 kg)  SpO2: 99%   PHYSICAL EXAM: General: Chronically ill appearing.  No resp difficulty. Ambulated in clinic without difficulty. Ambulated around clinic and maintained oxygen saturations > 93%.   HEENT:  normal Neck: supple. JVP 7 cm.  Carotids 2+ bilaterally; no bruits. No lymphadenopathy or thryomegaly appreciated. Cor: PMI laterally displaced. Regular rate & regular rhythm; wide split S2 Lungs: Clear Abdomen: obese, soft, nontender, non distended.  No hepatosplenomegaly. No bruits or masses. Good bowel sounds. Extremities: no cyanosis, clubbing, rash.  No edema. Neuro: alert & orientedx3, cranial nerves grossly intact. Moves all 4 extremities w/o difficulty. Affect pleasant.   ASSESSMENT & PLAN: 1. Chronic systolic heart failure: Ischemic cardiomyopathy.  Echo (6/15) with EF 25%,  S/P Biotronic ICD 2009.  QRS not wide so not CRT candidate. Overall stable NYHA II-III. She does not appear volume overloaded.  BP is low today but did not have breakfast.  - Continue torsemide 40 mg twice a day with metolazone once a week.  - Blood pressure too low to add ARB or beta blocker.  - Decrease spironolactone to 12.5 mg daily with low BP.  -  Continue digoxin 0.25 mg daily, last digoxin level 0.5.  - She is not interested in LVAD but would be open to the possibility of a cardiac transplantation.  She understands that she will have to quit smoking to consider this.   2.  CAD:  S/P multiple interventions. No chest pain, continue ASA 81 and statin 3.  Current smoker: Cigars. We discussed cessation.  She understands she is not a candidate for transplant if she continues.   4. 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   - On Revatio 20 mg tid, no BP room to titrate.  6 minute walk was done today; she did well. - Encouraged her to use CPAP - Based on CT and PFTs, concern for interstitial lung disease. To follow with pulmonary.  - Serologic workup: ANA, anti-SCL70, RF, HIV.   - She will be getting a V/Q scan soon. 5. OSA: Has CPAP but not sure about settings and hasn't used in several years. She needs to use given new diagnosis of pulmonary HTN.   [name] 10/01/2013

## 2013-10-04 LAB — PULMONARY FUNCTION TEST
DL/VA % pred: 45 %
DL/VA: 2.23 ml/min/mmHg/L
DLCO UNC % PRED: 29 %
DLCO unc: 7.47 ml/min/mmHg
FEF 25-75 Post: 2.21 L/sec
FEF 25-75 Pre: 2.4 L/sec
FEF2575-%Change-Post: -7 %
FEF2575-%PRED-POST: 94 %
FEF2575-%PRED-PRE: 102 %
FEV1-%Change-Post: -1 %
FEV1-%Pred-Post: 81 %
FEV1-%Pred-Pre: 82 %
FEV1-Post: 1.87 L
FEV1-Pre: 1.89 L
FEV1FVC-%Change-Post: 0 %
FEV1FVC-%PRED-PRE: 107 %
FEV6-%Change-Post: -1 %
FEV6-%PRED-POST: 76 %
FEV6-%Pred-Pre: 77 %
FEV6-POST: 2.16 L
FEV6-Pre: 2.19 L
FEV6FVC-%CHANGE-POST: 0 %
FEV6FVC-%PRED-PRE: 102 %
FEV6FVC-%Pred-Post: 103 %
FVC-%CHANGE-POST: -1 %
FVC-%PRED-POST: 74 %
FVC-%PRED-PRE: 75 %
FVC-Post: 2.16 L
FVC-Pre: 2.2 L
PRE FEV1/FVC RATIO: 86 %
Post FEV1/FVC ratio: 86 %
Post FEV6/FVC ratio: 100 %
Pre FEV6/FVC Ratio: 100 %
RV % pred: 84 %
RV: 1.64 L
TLC % pred: 76 %
TLC: 3.99 L

## 2013-10-06 ENCOUNTER — Other Ambulatory Visit (HOSPITAL_COMMUNITY): Payer: Self-pay | Admitting: Cardiology

## 2013-10-06 MED ORDER — NITROGLYCERIN 0.4 MG SL SUBL: 0.4000 mg | SUBLINGUAL_TABLET | SUBLINGUAL | Status: DC | PRN

## 2013-10-06 NOTE — Telephone Encounter (Signed)
Pt states she is leaving for the beach in the morning and requests a refill on nitro Refill sent to pts pharmacy on file

## 2013-10-08 ENCOUNTER — Ambulatory Visit (HOSPITAL_COMMUNITY): Payer: Medicare Other

## 2013-10-15 ENCOUNTER — Ambulatory Visit (HOSPITAL_COMMUNITY)
Admission: RE | Admit: 2013-10-15 | Discharge: 2013-10-15 | Disposition: A | Discharge status: Home / Self Care | Payer: Medicare Other | Source: Ambulatory Visit | Attending: Pulmonary Disease | Admitting: Pulmonary Disease

## 2013-10-15 DIAGNOSIS — I2789 Other specified pulmonary heart diseases: Secondary | ICD-10-CM | POA: Insufficient documentation

## 2013-10-15 DIAGNOSIS — I517 Cardiomegaly: Secondary | ICD-10-CM | POA: Insufficient documentation

## 2013-10-15 DIAGNOSIS — R0602 Shortness of breath: Secondary | ICD-10-CM

## 2013-10-15 DIAGNOSIS — R0609 Other forms of dyspnea: Secondary | ICD-10-CM | POA: Diagnosis not present

## 2013-10-15 DIAGNOSIS — R0989 Other specified symptoms and signs involving the circulatory and respiratory systems: Principal | ICD-10-CM | POA: Insufficient documentation

## 2013-10-15 DIAGNOSIS — J438 Other emphysema: Secondary | ICD-10-CM | POA: Diagnosis not present

## 2013-10-15 MED ORDER — TECHNETIUM TC 99M DIETHYLENETRIAME-PENTAACETIC ACID: 43.0000 | Freq: Once | INTRAVENOUS | Status: DC | PRN

## 2013-10-15 MED ORDER — TECHNETIUM TO 99M ALBUMIN AGGREGATED
5.1000 | Freq: Once | INTRAVENOUS | Status: AC | PRN
  Administered 2013-10-15: 5 via INTRAVENOUS

## 2013-10-16 ENCOUNTER — Encounter: Payer: Self-pay | Admitting: Pulmonary Disease

## 2013-10-16 NOTE — Progress Notes (Signed)
Quick Note:  Spoke with pt, she is aware of results and recs. Nothing further needed at this time. ______ 

## 2013-10-26 ENCOUNTER — Encounter: Payer: Self-pay | Admitting: Pulmonary Disease

## 2013-10-26 ENCOUNTER — Telehealth: Payer: Self-pay

## 2013-10-26 ENCOUNTER — Other Ambulatory Visit: Payer: Medicare Other

## 2013-10-26 ENCOUNTER — Ambulatory Visit: Payer: Medicare Other | Admitting: Pulmonary Disease

## 2013-10-26 VITALS — BP 116/66 | HR 83 | Ht 65.0 in | Wt 143.0 lb

## 2013-10-26 DIAGNOSIS — I272 Pulmonary hypertension, unspecified: Secondary | ICD-10-CM

## 2013-10-26 DIAGNOSIS — I5023 Acute on chronic systolic (congestive) heart failure: Secondary | ICD-10-CM

## 2013-10-26 DIAGNOSIS — J40 Bronchitis, not specified as acute or chronic: Secondary | ICD-10-CM

## 2013-10-26 DIAGNOSIS — I2789 Other specified pulmonary heart diseases: Secondary | ICD-10-CM

## 2013-10-26 DIAGNOSIS — R768 Other specified abnormal immunological findings in serum: Secondary | ICD-10-CM | POA: Insufficient documentation

## 2013-10-26 DIAGNOSIS — G4733 Obstructive sleep apnea (adult) (pediatric): Secondary | ICD-10-CM

## 2013-10-26 MED ORDER — ALBUTEROL SULFATE HFA 108 (90 BASE) MCG/ACT IN AERS: 2.0000 | INHALATION_SPRAY | Freq: Four times a day (QID) | RESPIRATORY_TRACT | Status: DC | PRN

## 2013-10-26 MED ORDER — DOXYCYCLINE HYCLATE 100 MG PO TABS: 100.0000 mg | ORAL_TABLET | Freq: Two times a day (BID) | ORAL | Status: DC

## 2013-10-26 NOTE — Patient Instructions (Addendum)
We will arrange a split night sleep study for you  Take the doxycycline 100mg  by mouth twice a day for a week, be carefull with excessive sun exposure for this  Increase you metolazone and decrease your fluid intake to get your weight back down to 130 lbs  Stop smoking!  We will see you back in 6-8 weeks or sooner if needed

## 2013-10-26 NOTE — Telephone Encounter (Signed)
Message copied by Velvet BatheAULFIELD, Jazari Ober L on Mon Oct 26, 2013  2:28 PM ------      Message from: Max FickleMCQUAID, DOUGLAS B      Created: Mon Oct 26, 2013  1:20 PM       A,            Please let her know that her ONO showed that her oxygen dropped below 88% for more than an hour.  So she needs to be on 2L O2 and I would like for her to have another polysomnogram for obstructive sleep apnea.  Split night study please.            Thanks      B ------

## 2013-10-26 NOTE — Assessment & Plan Note (Signed)
She is nine pounds up today and sounds volume overloaded on exam.  Thjis is primarily due to increased PO intake of salt and fluids.  Plan: -she will increase metolazone and decrease her salt and fluid intake to get back to a goal of 130lbs (she has done this many times before and is confident she can decrease her weight quickly)

## 2013-10-26 NOTE — Assessment & Plan Note (Signed)
ONO was positive, will order split night study as she has known obstructive sleep apnea

## 2013-10-26 NOTE — Assessment & Plan Note (Addendum)
She seems to be tolerating her medical regimen and today is symptomatic because she is volume overloaded from dietary indiscretion.    Her recent serology panel showed a markedly elevated rheumatoid factor.  This is interesting as that disease is not commonly associated with pulmonary hypertension.  She only notes occasional ankle pain but no other signs of rheumatoid arthritis.     Her ONO suggested nocturnal hypoxemia so we need to get her set up for a split night study given her known OSA.  Plan: -check CCP, RNP antibody, TSH -check split night sleep study -continue sildenafil -check 6 min walk

## 2013-10-26 NOTE — Telephone Encounter (Signed)
This was addressed in clinic today at her office visit.  Nothing further needed at this time.

## 2013-10-26 NOTE — Progress Notes (Signed)
Subjective:    Patient ID: Jessica [name], female    DOB: 1960/12/08, 53 y.o.   MRN: [id number]  Synopsis: Jessica [name] has a history of sleep apnea, pulmonary embolism, and systolic heart failure who was referred to the [facility] pulmonary clinic in 2015 for evaluation of pulmonary hypertension.  She had been started on sildenafil through the Advanced Heart Failure clinic.  She currently smokes cigars.  She had a PFT in 10/2013 which did not show air flow obstruction.  HPI  10/26/2013 ROV > Jessica [name] says that she has been experiencing more shortness of breath lately that seems to correlate with her weight.  She has been eating more sodium and drink more tea lately.  She has not taken an extra metolazone lately as she knows that this really.  She had been active lately though and was able to swim in the water with her granddaughter for a while a few weeks ago.  Her weight was down at that time.  Since then she has gained about 9 pounds and during that time she has been more dyspneic.  She has been having more coughing spells.  She has been producing mucus in the mornings.  She had a lot of mucus that she produced this morning.   Past Medical History  Diagnosis Date  . Chronic systolic heart failure     a.  secondary to severe ischemic cardiomyopathy with EF 20-25%;  b. s/p rimplantation of Biotronik single chamber ICD by Dr Ladona Ridgel 02/2008;  c.  Right heart cath 04/2007: RA 18, PA 56/30 (40), PCWP 36. FICK 3.89/2.06 Woods;  d. echo 9/09: Ef 20-25%, mild MR, mild LAE  . CAD (coronary artery disease)     a. s/p multiple PCIs; b. cath 10/07: LAD stent 40%, dx 80% ISR - tx with POBA; OM stent ok; ostial OM 60%; RCA occluded with L-R collats  . Tobacco abuse   . Alcohol abuse   . History of noncompliance with medical treatment   . Diabetes mellitus, type 2   . Dizziness   . HLD (hyperlipidemia)   . DVT (deep venous thrombosis)     coumadin  . Arterial embolism     s/p right iliofemoral embolectomy with  fasciotomy  . V-tach   . AICD (automatic cardioverter/defibrillator) present   . Angina   . Asthma   . Shortness of breath   . Sleep apnea   . CHF (congestive heart failure)   . GERD (gastroesophageal reflux disease)   . Anxiety   . Myocardial infarction   . COPD (chronic obstructive pulmonary disease)      Review of Systems  Constitutional: Positive for fatigue. Negative for fever and chills.  HENT: Negative for postnasal drip, rhinorrhea and sinus pressure.   Respiratory: Positive for cough and shortness of breath. Negative for wheezing.   Cardiovascular: Positive for leg swelling. Negative for chest pain and palpitations.       Objective:   Physical Exam Filed Vitals:   10/26/13 1355  BP: 116/66  Pulse: 83  Height: 5\' 5"  (1.651 m)  Weight: 143 lb (64.864 kg)  SpO2: 97%   RA  Gen: well appearing, no acute distress HEENT: NCAT, EOMi, OP clear,  PULM: Crackles in bases bilaterally, no wheezing CV: RRR, no mgr, no JVD AB: BS+, soft, slight swelling, non tender Ext: warm, trace edema, no clubbing, no cyanosis Derm: multiple old healing hyperpigmented areas Neuro: A&Ox4, MAEW   RA = 15  RV = 78/5/18  PA =  79/31 (47)  PCW = 12  Fick cardiac output/index = 2.1/1.3  Thermo CO/CI = 3.4/2.0  PVR = 16.5 [place]  FA sat = 90% (pulse ox)  PA sat = 39%, 40%      Assessment & Plan:   Pulmonary HTN She seems to be tolerating her medical regimen and today is symptomatic because she is volume overloaded from dietary indiscretion.    Her recent serology panel showed a markedly elevated rheumatoid factor.  This is interesting as that disease is not commonly associated with pulmonary hypertension.  She only notes occasional ankle pain but no other signs of rheumatoid arthritis.     Her ONO suggested nocturnal hypoxemia so we need to get her set up for a split night study given her known OSA.  Plan: -check CCP, RNP antibody, TSH -check split night sleep study -continue  sildenafil -check 6 min walk  SYSTOLIC HEART FAILURE, ACUTE ON CHRONIC She is nine pounds up today and sounds volume overloaded on exam.  Thjis is primarily due to increased PO intake of salt and fluids.  Plan: -she will increase metolazone and decrease her salt and fluid intake to get back to a goal of 130lbs (she has done this many times before and is confident she can decrease her weight quickly)  OSA (obstructive sleep apnea) ONO was positive, will order split night study as she has known obstructive sleep apnea  BRONCHITIS Surprisingly, her lung function test did not show evidence of COPD or asthma.  However, she appears to have bronchitis today. This is certainly related to some degree to her ongoing cigar smoking.  Plan: -Quit smoking -Doxycycline 100 mg by mouth twice a day x7 days instructed to be careful with sun exposure  Rheumatoid factor positive Her rheumatoid factor was dramatically elevated, but she really has no classic signs of that disease.  I'm not sure what to make of that but her unexplained skin condition and pulmonary hypertension raise concern for an autoimmune process.  She denies active rash or dysphagia, so I really can't classify her as having a particular connective tissue disease.  Plan: -check CCP antibody and RNP antibody -if labs positive, refer to rheumatology    Updated Medication List Outpatient Encounter Prescriptions as of 10/26/2013  Medication Sig  . aspirin EC 81 MG tablet Take 1 tablet (81 mg total) by mouth daily.  [name] DIGOX 250 MCG tablet TAKE 1 TABLET (0.25 MG TOTAL) BY MOUTH DAILY.  [name] KLOR-CON M20 20 MEQ tablet TAKE 2 TABLETS BY MOUTH DAILY. MAY TAKE EXTRA TAB IF YOU USE METOLAZONE  . Magnesium 200 MG TABS Take 100 mg by mouth daily.  . metolazone (ZAROXOLYN) 2.5 MG tablet Take 1 tablet (2.5 mg total) by mouth once a week. Every Wednesday  . nitroGLYCERIN (NITROSTAT) 0.4 MG SL tablet Place 1 tablet (0.4 mg total) under the tongue every  5 (five) minutes as needed for chest pain.  . sildenafil (REVATIO) 20 MG tablet Take 1 tablet (20 mg total) by mouth 3 (three) times daily.  . simvastatin (ZOCOR) 20 MG tablet Take 1 tablet (20 mg total) by mouth at bedtime.  [name] spironolactone (ALDACTONE) 25 MG tablet Take 0.5 tablets (12.5 mg total) by mouth daily.  [name] torsemide (DEMADEX) 20 MG tablet Take 2 tablets (40 mg total) by mouth 2 (two) times daily.  . [DISCONTINUED] albuterol (PROVENTIL) (2.5 MG/3ML) 0.083% nebulizer solution Take 2.5 mg by nebulization every 6 (six) hours as needed for wheezing or shortness of breath.  . [  DISCONTINUED] Magnesium 200 MG TABS Take 1 tablet (200 mg total) by mouth daily.  [name]. albuterol (PROVENTIL HFA;VENTOLIN HFA) 108 (90 BASE) MCG/ACT inhaler Inhale 2 puffs into the lungs every 6 (six) hours as needed for wheezing or shortness of breath.  . doxycycline (VIBRA-TABS) 100 MG tablet Take 1 tablet (100 mg total) by mouth 2 (two) times daily.

## 2013-10-26 NOTE — Assessment & Plan Note (Signed)
Her rheumatoid factor was dramatically elevated, but she really has no classic signs of that disease.  I'm not sure what to make of that but her unexplained skin condition and pulmonary hypertension raise concern for an autoimmune process.  She denies active rash or dysphagia, so I really can't classify her as having a particular connective tissue disease.  Plan: -check CCP antibody and RNP antibody -if labs positive, refer to rheumatology

## 2013-10-26 NOTE — Assessment & Plan Note (Signed)
Surprisingly, her lung function test did not show evidence of COPD or asthma.  However, she appears to have bronchitis today. This is certainly related to some degree to her ongoing cigar smoking.  Plan: -Quit smoking -Doxycycline 100 mg by mouth twice a day x7 days instructed to be careful with sun exposure

## 2013-10-27 LAB — CYCLIC CITRUL PEPTIDE ANTIBODY, IGG

## 2013-10-27 NOTE — Progress Notes (Signed)
I will have her seen in CHF clinic soon.

## 2013-10-28 ENCOUNTER — Encounter: Payer: Self-pay | Admitting: Pulmonary Disease

## 2013-10-28 ENCOUNTER — Telehealth (HOSPITAL_COMMUNITY): Payer: Self-pay | Admitting: *Deleted

## 2013-10-28 NOTE — Telephone Encounter (Signed)
Received form from [facility], pt needs clearance to get teeth pulled, form completed by Dr Gala RomneyBensimhon and faxed back to [phone]

## 2013-10-29 NOTE — Progress Notes (Signed)
Quick Note:  Spoke with pt, she is aware of results. Nothing further needed. ______ 

## 2013-11-04 ENCOUNTER — Ambulatory Visit (HOSPITAL_BASED_OUTPATIENT_CLINIC_OR_DEPARTMENT_OTHER): Payer: Medicare Other | Attending: Pulmonary Disease | Admitting: Radiology

## 2013-11-04 VITALS — Ht 65.0 in | Wt 135.0 lb

## 2013-11-04 DIAGNOSIS — I2789 Other specified pulmonary heart diseases: Secondary | ICD-10-CM | POA: Insufficient documentation

## 2013-11-04 DIAGNOSIS — I491 Atrial premature depolarization: Secondary | ICD-10-CM | POA: Diagnosis not present

## 2013-11-04 DIAGNOSIS — I4949 Other premature depolarization: Secondary | ICD-10-CM | POA: Diagnosis not present

## 2013-11-04 DIAGNOSIS — I272 Pulmonary hypertension, unspecified: Secondary | ICD-10-CM

## 2013-11-11 ENCOUNTER — Encounter: Payer: Self-pay | Admitting: Pulmonary Disease

## 2013-11-11 DIAGNOSIS — G4730 Sleep apnea, unspecified: Secondary | ICD-10-CM

## 2013-11-11 DIAGNOSIS — G4710 Hypersomnia, unspecified: Secondary | ICD-10-CM

## 2013-11-11 NOTE — Sleep Study (Signed)
   NAME: Jessica [name] DATE OF BIRTH:  [birth year] MEDICAL RECORD NUMBER [id number]  LOCATION: [facility]  PHYSICIAN: Barbaraann Share  DATE OF STUDY: 11/04/2013  SLEEP STUDY TYPE: Nocturnal Polysomnogram               REFERRING PHYSICIAN: Lupita Leash, MD  INDICATION FOR STUDY: Hypersomnia with sleep apnea  EPWORTH SLEEPINESS SCORE:  11 HEIGHT:  (165.1 cm)  WEIGHT: 135 lb (61.236 kg)    Body mass index is 22.47 kg/(m^2).  NECK SIZE: 12.5 in.  MEDICATIONS: Reviewed in the sleep record  SLEEP ARCHITECTURE: The patient had a total sleep time of 380 minutes, with very little slow-wave sleep and adequate quantity of REM. Sleep onset latency was normal at 18 minutes, and REM onset was normal at 102 minutes. Sleep efficiency was mildly reduced at 88%.  RESPIRATORY DATA: The patient was found to have 2 apneas and 24 obstructive hypopneas, giving her an AHI of only 4 of events per hour. The events occurred in all body positions, and there was mild snoring noted throughout. The patient did not meet split-night protocol secondary to the small numbers of events.  OXYGEN DATA: There was transient oxygen desaturation as low as 85% with the patient's obstructive events  CARDIAC DATA: Occasional PAC and PVC noted  MOVEMENT/PARASOMNIA: No significant limb movements or abnormal behaviors were seen  IMPRESSION/ RECOMMENDATION:    1) small numbers of obstructive events which do not meet the AHI criteria for the obstructive sleep apnea syndrome. The patient did have mild snoring noted throughout, and transient oxygen desaturation with her documented events.  The patient spent only 9 minutes the entire night less than 88% saturation.  2) occasional PAC and PVC noted, but no clinically significant arrhythmias were seen.     Barbaraann Share Diplomate, American Board of Sleep Medicine  ELECTRONICALLY SIGNED ON:  11/11/2013, 5:50 PM [facility] PH: [phone]   FX: [phone] ACCREDITED BY THE AMERICAN ACADEMY OF SLEEP MEDICINE

## 2013-11-13 ENCOUNTER — Telehealth: Payer: Self-pay

## 2013-11-13 NOTE — Telephone Encounter (Signed)
I spoke with the pt and notified that PSG was normal She verbalized understanding  Nothing further needed

## 2013-11-13 NOTE — Telephone Encounter (Signed)
A,  Please let her know that this was normal  Thanks, B  ----- Message ----- FrBarbaraann Share Clance, MD Sent: 11/11/2013 5:53 PM To: No Pcp Per Patient, Lupita Leash, MD  ---------------------------------------------------------------------------------------- lmtcb X1 to make pt aware of normal sleep study.

## 2013-11-13 NOTE — Telephone Encounter (Signed)
Pt returned call. [phone]

## 2013-11-18 ENCOUNTER — Other Ambulatory Visit (HOSPITAL_COMMUNITY): Payer: Self-pay | Admitting: Internal Medicine

## 2013-11-18 DIAGNOSIS — I509 Heart failure, unspecified: Secondary | ICD-10-CM

## 2013-12-01 ENCOUNTER — Ambulatory Visit (HOSPITAL_COMMUNITY)
Admission: RE | Admit: 2013-12-01 | Discharge: 2013-12-01 | Disposition: A | Discharge status: Home / Self Care | Payer: Medicare Other | Source: Ambulatory Visit | Attending: Internal Medicine | Admitting: Internal Medicine

## 2013-12-01 ENCOUNTER — Encounter (HOSPITAL_COMMUNITY): Payer: Self-pay

## 2013-12-01 VITALS — BP 109/61 | HR 71 | Resp 18 | Wt 135.5 lb

## 2013-12-01 DIAGNOSIS — E119 Type 2 diabetes mellitus without complications: Secondary | ICD-10-CM | POA: Diagnosis not present

## 2013-12-01 DIAGNOSIS — I5022 Chronic systolic (congestive) heart failure: Secondary | ICD-10-CM | POA: Diagnosis not present

## 2013-12-01 DIAGNOSIS — I2789 Other specified pulmonary heart diseases: Secondary | ICD-10-CM

## 2013-12-01 DIAGNOSIS — I2510 Atherosclerotic heart disease of native coronary artery without angina pectoris: Secondary | ICD-10-CM | POA: Insufficient documentation

## 2013-12-01 DIAGNOSIS — G4733 Obstructive sleep apnea (adult) (pediatric): Secondary | ICD-10-CM | POA: Diagnosis not present

## 2013-12-01 DIAGNOSIS — I272 Pulmonary hypertension, unspecified: Secondary | ICD-10-CM

## 2013-12-01 DIAGNOSIS — E785 Hyperlipidemia, unspecified: Secondary | ICD-10-CM | POA: Diagnosis not present

## 2013-12-01 DIAGNOSIS — F17200 Nicotine dependence, unspecified, uncomplicated: Secondary | ICD-10-CM | POA: Insufficient documentation

## 2013-12-01 DIAGNOSIS — I509 Heart failure, unspecified: Secondary | ICD-10-CM | POA: Insufficient documentation

## 2013-12-01 NOTE — Patient Instructions (Signed)
Follow up in 8 weeks with Dr Gala Romney

## 2013-12-01 NOTE — Progress Notes (Signed)
Patient ID: Jessica [name], female   DOB: 15-Aug-1960, 53 y.o.   MRN: [id number] PCP: Dr Jessica [name] EP: Dr Jessica [name]  HPI: Jessica [name] is a 53y.o. with a history of ICM, chronic systolic heart failure EF 25%,  CAD, S/P multiple interventions, ICD Biotronic, DM, , hyperlipidemia, former smoker, S/P right iliofemoral embolectomy and compartment fasciotomy of the right leg by Dr. Darrick [name] with LLE DVT 06/2010.  S/P ICD discharge 03/13/13.    She returns for follow up. Has sleep study --no indication for CPAP. Weight at home 130 pounds. Complains of fatigue.  SOB with exertion. + Orthopnea. Denies PND. Intermittent dizziness. Had presyncope. Taking all medications. Smoking 1-2 cigars a day. Had a beer last night. Extra fluid intake yesterday.   She has seen Dr. Kendrick [name] as part of her [facility] workup.  PFTs were done in 7/15 suggesting an interstitial process (restrictive) with FVC 74%%, FEV1 81%, ratio 107%, TLC 76%, DLCO 29%. CTA chest in 4/15 showed no PE but did have focal honeycomb changes in the lung bases.    RHC 07/23/2013: RA 15, RV 78/5/18, PA 79/31 (47), PCWP 12, Fick CO/CI 2.1/1.3, Thermo CO/CI 3.4/2.0, PVR 16.5, PA 39% and 40%  ECHO 02/22/11: Only basal function preserved. EF 20-25%. ECHO 12/20/2011 EF 15%  ECHO 6/15 EF 25% with diffuse hypokinesis, severe LV dilation, mild MR, RV mildly dilated with moderately decreased systolic function.   6 minute walk (7/15): 384 m  Labs: 03/13/13 K 3.8 Creatinine 0.90 03/17/13 K 4.7 Creatinine 0.94  Magnesium 1.6 Started on magnesium 4/15 K 3.7 => 4.6, Creatinine 0.89 => 1.23 Pro BNP 1848  07/14/2013: K 4.1, creatinine 0.88, digoxin 1.0, Pro BNP 2130 08/05/13 Dig 0.5  6/15 K 3.8, creatinine 1.2, digoxin 0.5, HCT 43.6 09/30/13 HIV NR, ANA negative  ROS: All systems negative except as listed in HPI, PMH and Problem List.  SH: smokes cigar daily, Drinks occasional beer; lives with boyfriend and 2 children in [place].   FH: Mom living: DM2, HTN, CKD  Father deceased: MI,  CAD, HTN   Past Medical History  Diagnosis Date  . Chronic systolic heart failure     a.  secondary to severe ischemic cardiomyopathy with EF 20-25%;  b. s/p rimplantation of Biotronik single chamber ICD by Dr Jessica [name] 02/2008;  c.  Right heart cath 04/2007: RA 18, PA 56/30 (40), PCWP 36. FICK 3.89/2.06 [place];  d. echo 9/09: Ef 20-25%, mild MR, mild LAE  . CAD (coronary artery disease)     a. s/p multiple PCIs; b. cath 10/07: LAD stent 40%, dx 80% ISR - tx with POBA; OM stent ok; ostial OM 60%; RCA occluded with L-R collats  . Tobacco abuse   . Alcohol abuse   . History of noncompliance with medical treatment   . Diabetes mellitus, type 2   . Dizziness   . HLD (hyperlipidemia)   . DVT (deep venous thrombosis)     coumadin  . Arterial embolism     s/p right iliofemoral embolectomy with fasciotomy  . V-tach   . AICD (automatic cardioverter/defibrillator) present   . Angina   . Asthma   . Shortness of breath   . Sleep apnea   . CHF (congestive heart failure)   . GERD (gastroesophageal reflux disease)   . Anxiety   . Myocardial infarction   . COPD (chronic obstructive pulmonary disease)     Current Outpatient Prescriptions  Medication Sig Dispense Refill  . albuterol (PROVENTIL HFA;VENTOLIN HFA) 108 (90 BASE) MCG/ACT inhaler Inhale 2  puffs into the lungs every 6 (six) hours as needed for wheezing or shortness of breath.  1 Inhaler  2  . aspirin EC 81 MG tablet Take 1 tablet (81 mg total) by mouth daily.      [name] DIGOX 250 MCG tablet TAKE 1 TABLET (0.25 MG TOTAL) BY MOUTH DAILY.  30 tablet  5  . doxycycline (VIBRA-TABS) 100 MG tablet Take 1 tablet (100 mg total) by mouth 2 (two) times daily.  14 tablet  0  . KLOR-CON M20 20 MEQ tablet TAKE 2 TABLETS BY MOUTH DAILY. MAY TAKE EXTRA TAB IF YOU USE METOLAZONE  70 tablet  5  . magnesium oxide (MAG-OX) 400 (241.3 MG) MG tablet TAKE 1/2 TABLET ( ) EVERY DAY  15 tablet  3  . metolazone (ZAROXOLYN) 2.5 MG tablet Take 1 tablet (2.5 mg total)  by mouth once a week. Every Wednesday  15 tablet  10  . nitroGLYCERIN (NITROSTAT) 0.4 MG SL tablet Place 1 tablet (0.4 mg total) under the tongue every 5 (five) minutes as needed for chest pain.  100 tablet  3  . sildenafil (REVATIO) 20 MG tablet Take 1 tablet (20 mg total) by mouth 3 (three) times daily.  90 tablet  6  . simvastatin (ZOCOR) 20 MG tablet Take 1 tablet (20 mg total) by mouth at bedtime.  30 tablet  3  . spironolactone (ALDACTONE) 25 MG tablet Take 0.5 tablets (12.5 mg total) by mouth daily.  30 tablet  6  . torsemide (DEMADEX) 20 MG tablet Take 2 tablets (40 mg total) by mouth 2 (two) times daily.  120 tablet  6   No current facility-administered medications for this encounter.      Filed Vitals:   12/01/13 1126  BP: 109/61  Pulse: 71  Resp: 18  Weight: 135 lb 8 oz (61.462 kg)  SpO2: 99%   PHYSICAL EXAM: General: Chronically ill appearing.  No resp difficulty. Ambulated in clinic without difficulty. Ambulated around clinic and maintained oxygen saturations > 93%.   HEENT:  normal Neck: supple. JVP 7 cm.  Carotids 2+ bilaterally; no bruits. No lymphadenopathy or thryomegaly appreciated. Cor: PMI laterally displaced. Regular rate & regular rhythm; wide split S2 Lungs: Clear Abdomen: obese, soft, nontender, non distended.  No hepatosplenomegaly. No bruits or masses. Good bowel sounds. Extremities: no cyanosis, clubbing, rash.  No edema. Neuro: alert & orientedx3, cranial nerves grossly intact. Moves all 4 extremities w/o difficulty. Affect pleasant.   ASSESSMENT & PLAN: 1. Chronic systolic heart failure: Ischemic cardiomyopathy.  Echo (6/15) with EF 25%,  S/P Biotronic ICD 2009.  QRS not wide so not CRT candidate. Overall stable NYHA II-III. She does not appear volume overloaded.  BP is low today but did not have breakfast.  - Continue torsemide 40 mg twice a day with metolazone once a week.  - Blood pressure too low to add ARB or beta blocker.  - Continue  spironolactone to 12.5 mg daily with low BP.  - Continue digoxin 0.25 mg daily, last digoxin level 0.5.  - She is not interested in LVAD but would be open to the possibility of a cardiac transplantation.  She understands that she will have to quit smoking to consider this.   2.  CAD:  S/P multiple interventions. No chest pain, continue ASA 81 and statin 3.  Current smoker: Cigars. We discussed cessation.  She understands she is not a candidate for transplant if she continues.   4. 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   - On Revatio 20 mg tid, no BP room to titrate.  6 minute walk was done today; she did well. - No indication for CPAP.  - Based on CT and PFTs, concern for interstitial lung disease. To follow with pulmonary.  - Serologic workup: ANA negative. , anti-SCL70, RF, HIV NR   - 10/2013  V/Q scan--> normal   Refer to pulmonary rehab.  5. OSA: Had sleep study. No indication for CPAP per pulmonary    Follow up in 8 weeks  CLEGG,AMY NP-C  12/01/2013  Patient seen and examined with [name], NP. We discussed all aspects of the encounter. I agree with the assessment and plan as stated above. She is stable. Continue current regimen. i do not feel that she is suitable transplant candidate.   Garnie Borchardt,MD 3:45 PM

## 2013-12-02 ENCOUNTER — Telehealth (HOSPITAL_COMMUNITY): Payer: Self-pay | Admitting: Cardiology

## 2013-12-02 NOTE — Telephone Encounter (Signed)
Pharmacy called for interaction/MED ALERT For sildenafil and nitrostat  Per VO Amy Clegg, NP D/C Nitrostat  Pt aware and voiced understanding, advised to not take Nitrostat if she had any in the home, if she felt like she needed Nitrostat to contact office

## 2013-12-04 ENCOUNTER — Ambulatory Visit (INDEPENDENT_AMBULATORY_CARE_PROVIDER_SITE_OTHER): Payer: Medicare Other | Admitting: *Deleted

## 2013-12-04 DIAGNOSIS — I4729 Other ventricular tachycardia: Secondary | ICD-10-CM

## 2013-12-04 DIAGNOSIS — I472 Ventricular tachycardia: Secondary | ICD-10-CM

## 2013-12-04 NOTE — Progress Notes (Signed)
Remote ICD transmission.   

## 2013-12-09 LAB — MDC_IDC_ENUM_SESS_TYPE_REMOTE
Battery Voltage: 3.02 V
HIGH POWER IMPEDANCE MEASURED VALUE: 48 Ohm
Implantable Pulse Generator Model: 540
Lead Channel Impedance Value: 495 Ohm
Lead Channel Sensing Intrinsic Amplitude: 18.6 mV
Lead Channel Setting Pacing Amplitude: 2.4 V
Lead Channel Setting Pacing Pulse Width: 0.4 ms
MDC IDC MSMT LEADCHNL RV PACING THRESHOLD AMPLITUDE: 0.6 V
MDC IDC MSMT LEADCHNL RV PACING THRESHOLD PULSEWIDTH: 0.4 ms
MDC IDC PG SERIAL: 60439356
MDC IDC STAT BRADY RV PERCENT PACED: 0 %
Zone Setting Detection Interval: 300 ms

## 2013-12-25 ENCOUNTER — Encounter: Payer: Self-pay | Admitting: Cardiology

## 2013-12-25 ENCOUNTER — Other Ambulatory Visit: Payer: Self-pay

## 2014-01-13 ENCOUNTER — Encounter: Payer: Self-pay | Admitting: Internal Medicine

## 2014-01-28 ENCOUNTER — Encounter: Payer: Self-pay | Admitting: Internal Medicine

## 2014-01-28 ENCOUNTER — Encounter (HOSPITAL_COMMUNITY): Payer: Self-pay

## 2014-01-28 ENCOUNTER — Ambulatory Visit (HOSPITAL_COMMUNITY)
Admission: RE | Admit: 2014-01-28 | Discharge: 2014-01-28 | Disposition: A | Discharge status: Home / Self Care | Payer: Medicare Other | Source: Ambulatory Visit | Attending: Internal Medicine | Admitting: Internal Medicine

## 2014-01-28 VITALS — BP 92/60 | HR 91 | Wt 138.0 lb

## 2014-01-28 DIAGNOSIS — M25571 Pain in right ankle and joints of right foot: Secondary | ICD-10-CM | POA: Diagnosis not present

## 2014-01-28 DIAGNOSIS — I5022 Chronic systolic (congestive) heart failure: Secondary | ICD-10-CM | POA: Diagnosis not present

## 2014-01-28 DIAGNOSIS — I748 Embolism and thrombosis of other arteries: Secondary | ICD-10-CM | POA: Diagnosis not present

## 2014-01-28 DIAGNOSIS — Z86718 Personal history of other venous thrombosis and embolism: Secondary | ICD-10-CM | POA: Insufficient documentation

## 2014-01-28 DIAGNOSIS — E785 Hyperlipidemia, unspecified: Secondary | ICD-10-CM | POA: Insufficient documentation

## 2014-01-28 DIAGNOSIS — Z95810 Presence of automatic (implantable) cardiac defibrillator: Secondary | ICD-10-CM | POA: Insufficient documentation

## 2014-01-28 DIAGNOSIS — I272 Other secondary pulmonary hypertension: Secondary | ICD-10-CM | POA: Diagnosis not present

## 2014-01-28 DIAGNOSIS — Z79899 Other long term (current) drug therapy: Secondary | ICD-10-CM | POA: Diagnosis not present

## 2014-01-28 DIAGNOSIS — Z7982 Long term (current) use of aspirin: Secondary | ICD-10-CM | POA: Diagnosis not present

## 2014-01-28 DIAGNOSIS — I2510 Atherosclerotic heart disease of native coronary artery without angina pectoris: Secondary | ICD-10-CM

## 2014-01-28 DIAGNOSIS — F17210 Nicotine dependence, cigarettes, uncomplicated: Secondary | ICD-10-CM | POA: Diagnosis not present

## 2014-01-28 DIAGNOSIS — Z720 Tobacco use: Secondary | ICD-10-CM

## 2014-01-28 DIAGNOSIS — R55 Syncope and collapse: Secondary | ICD-10-CM | POA: Insufficient documentation

## 2014-01-28 DIAGNOSIS — I252 Old myocardial infarction: Secondary | ICD-10-CM | POA: Diagnosis not present

## 2014-01-28 DIAGNOSIS — G4733 Obstructive sleep apnea (adult) (pediatric): Secondary | ICD-10-CM | POA: Diagnosis not present

## 2014-01-28 DIAGNOSIS — F1010 Alcohol abuse, uncomplicated: Secondary | ICD-10-CM | POA: Insufficient documentation

## 2014-01-28 DIAGNOSIS — F419 Anxiety disorder, unspecified: Secondary | ICD-10-CM | POA: Insufficient documentation

## 2014-01-28 DIAGNOSIS — I255 Ischemic cardiomyopathy: Secondary | ICD-10-CM | POA: Diagnosis not present

## 2014-01-28 DIAGNOSIS — J45909 Unspecified asthma, uncomplicated: Secondary | ICD-10-CM | POA: Insufficient documentation

## 2014-01-28 DIAGNOSIS — K219 Gastro-esophageal reflux disease without esophagitis: Secondary | ICD-10-CM | POA: Diagnosis not present

## 2014-01-28 DIAGNOSIS — E119 Type 2 diabetes mellitus without complications: Secondary | ICD-10-CM | POA: Diagnosis not present

## 2014-01-28 DIAGNOSIS — I270 Primary pulmonary hypertension: Secondary | ICD-10-CM

## 2014-01-28 DIAGNOSIS — J449 Chronic obstructive pulmonary disease, unspecified: Secondary | ICD-10-CM | POA: Diagnosis not present

## 2014-01-28 DIAGNOSIS — F17200 Nicotine dependence, unspecified, uncomplicated: Secondary | ICD-10-CM

## 2014-01-28 LAB — BASIC METABOLIC PANEL
Anion gap: 17 — ABNORMAL HIGH (ref 5–15)
BUN: 16 mg/dL (ref 6–23)
CALCIUM: 9.5 mg/dL (ref 8.4–10.5)
CO2: 27 mEq/L (ref 19–32)
CREATININE: 0.99 mg/dL (ref 0.50–1.10)
Chloride: 93 mEq/L — ABNORMAL LOW (ref 96–112)
GFR calc Af Amer: 73 mL/min — ABNORMAL LOW (ref >90)
GFR, EST NON AFRICAN AMERICAN: 63 mL/min — AB (ref >90)
Glucose, Bld: 111 mg/dL — ABNORMAL HIGH (ref 70–99)
Potassium: 3.6 mEq/L — ABNORMAL LOW (ref 3.7–5.3)
Sodium: 137 mEq/L (ref 137–147)

## 2014-01-28 LAB — MAGNESIUM: Magnesium: 1.5 mg/dL (ref 1.5–2.5)

## 2014-01-28 NOTE — Progress Notes (Signed)
6 min walk test completed, pt amb 1100 ft, o2 sats ranged from 98-92% and HR ranged from 85-120

## 2014-01-28 NOTE — Progress Notes (Signed)
Patient ID: Jessica MoccasinSherita [name], female   DOB: 06-15-1960, 53 y.o.   MRN: [id number] PCP: Dr Dierdre SearlesLi EP: Dr Ladona Ridgelaylor  HPI: [name] is a 53y.o. with a history of ICM, chronic systolic heart failure EF 25%,  CAD, S/P multiple interventions, ICD Biotronic, DM, , hyperlipidemia, former smoker, S/P right iliofemoral embolectomy and compartment fasciotomy of the right leg by Dr. Darrick PennaFields with LLE DVT 06/2010.  S/P ICD discharge 03/13/13.    She has seen Dr. Kendrick FriesMcQuaid [facility].  PFTs were done in 7/15 suggesting an interstitial process (restrictive) with FVC 74%%, FEV1 81%, ratio 107%, TLC 76%, DLCO 29%. CTA chest in 4/15 showed no PE but did have focal honeycomb changes in the lung bases.    She returns for follow up. Overall she feels ok. Last week she had presyncope. Complains of R ankle pain. Overall breathing ok. Weight stable. No CP. Taking all medications except zocor. She says she stopped zocor due to muscle pains. Smoking 1-2 cigars a day.    RHC 07/23/2013: RA 15, RV 78/5/18, PA 79/31 (47), PCWP 12, Fick CO/CI 2.1/1.3, Thermo CO/CI 3.4/2.0, PVR 16.5, PA 39% and 40%  ECHO 02/22/11: Only basal function preserved. EF 20-25%. ECHO 12/20/2011 EF 15%  ECHO 6/15 EF 25% with diffuse hypokinesis, severe LV dilation, mild MR, RV mildly dilated with moderately decreased systolic function.   6 minute walk (7/15): 384 m 6 MW (01/28/14): 1100 ft (38710m), O2 sats ranged from 98-92% and HR ranged from 85-120   Labs: 03/13/13 K 3.8 Creatinine 0.90 03/17/13 K 4.7 Creatinine 0.94  Magnesium 1.6 Started on magnesium 4/15 K 3.7 => 4.6, Creatinine 0.89 => 1.23 Pro BNP 1848  07/14/2013: K 4.1, creatinine 0.88, digoxin 1.0, Pro BNP 2130 08/05/13 Dig 0.5  6/15 K 3.8, creatinine 1.2, digoxin 0.5, HCT 43.6 09/30/13 HIV NR, ANA negative  ROS: All systems negative except as listed in HPI, PMH and Problem List.  SH: smokes cigar daily, Drinks occasional beer; [place] 2 [place].   FH: Mom  living: DM2, HTN, CKD  Father deceased: MI, CAD, HTN   Past Medical History  Diagnosis Date  . Chronic systolic heart failure     a.  secondary to severe ischemic cardiomyopathy with EF 20-25%;  b. s/p rimplantation of Biotronik single chamber ICD by Dr Ladona Ridgelaylor 02/2008;  c.  Right heart cath 04/2007: RA 18, PA 56/30 (40), PCWP 36. FICK 3.89/2.06 [place];  d. echo 9/09: Ef 20-25%, mild MR, mild LAE  . CAD (coronary artery disease)     a. s/p multiple PCIs; b. cath 10/07: LAD stent 40%, dx 80% ISR - tx with POBA; OM stent ok; ostial OM 60%; RCA occluded with L-R collats  . Tobacco abuse   . Alcohol abuse   . History of noncompliance with medical treatment   . Diabetes mellitus, type 2   . Dizziness   . HLD (hyperlipidemia)   . DVT (deep venous thrombosis)     coumadin  . Arterial embolism     s/p right iliofemoral embolectomy with fasciotomy  . V-tach   . AICD (automatic cardioverter/defibrillator) present   . Angina   . Asthma   . Shortness of breath   . Sleep apnea   . CHF (congestive heart failure)   . GERD (gastroesophageal reflux disease)   . Anxiety   . Myocardial infarction   . COPD (chronic obstructive pulmonary disease)     Current Outpatient Prescriptions  Medication Sig Dispense  Refill  . albuterol (PROVENTIL HFA;VENTOLIN HFA) 108 (90 BASE) MCG/ACT inhaler Inhale 2 puffs into the lungs every 6 (six) hours as needed for wheezing or shortness of breath. 1 Inhaler 2  . aspirin EC 81 MG tablet Take 1 tablet (81 mg total) by mouth daily.    [name] DIGOX 250 MCG tablet TAKE 1 TABLET (0.25 MG TOTAL) BY MOUTH DAILY. 30 tablet 5  . KLOR-CON M20 20 MEQ tablet TAKE 2 TABLETS BY MOUTH DAILY. MAY TAKE EXTRA TAB IF YOU USE METOLAZONE 70 tablet 5  . magnesium oxide (MAG-OX) 400 (241.3 MG) MG tablet TAKE 1/2 TABLET (200MG ) EVERY DAY 15 tablet 3  . metolazone (ZAROXOLYN) 2.5 MG tablet Take 1 tablet (2.5 mg total) by mouth once a week. Every Wednesday 15 tablet 10  . sildenafil (REVATIO) 20 MG  tablet Take 1 tablet (20 mg total) by mouth 3 (three) times daily. 90 tablet 6  . simvastatin (ZOCOR) 20 MG tablet Take 1 tablet (20 mg total) by mouth at bedtime. 30 tablet 3  . spironolactone (ALDACTONE) 25 MG tablet Take 0.5 tablets (12.5 mg total) by mouth daily. 30 tablet 6  . torsemide (DEMADEX) 20 MG tablet Take 2 tablets (40 mg total) by mouth 2 (two) times daily. 120 tablet 6   No current facility-administered medications for this encounter.      Filed Vitals:   01/28/14 1131  BP: 92/60  Pulse: 91  Weight: 138 lb (62.596 kg)  SpO2: 94%   PHYSICAL EXAM: General: No resp difficulty. Ambulated in clinic without difficulty.    HEENT:  normal Neck: supple. JVP 8 cm.  Carotids 2+ bilaterally; no bruits. No lymphadenopathy or thryomegaly appreciated. Cor: PMI laterally displaced. Regular rate & regular rhythm; wide split S2 Soft S3 Lungs: Clear Abdomen: obese, soft, nontender, non distended.  No hepatosplenomegaly. No bruits or masses. Good bowel sounds. Extremities: no cyanosis, clubbing, rash.  RLE and LLE trace -1+edema. Neuro: alert & orientedx3, cranial nerves grossly intact. Moves all 4 extremities w/o difficulty. Affect pleasant.   ASSESSMENT & PLAN: 1. Chronic systolic heart failure: Ischemic cardiomyopathy.  Echo (6/15) with EF 25%,  S/P Biotronic ICD 2009.  Device interrogated - Continue torsemide 40 mg twice a day with metolazone once a week and prn for weight greater than 140 pounds.  - Blood pressure too low to add ARB or beta blocker.  - Continue spironolactone to 12.5 mg daily with low BP.  - Continue digoxin 0.25 mg daily, last digoxin level 0.5.  - She is not interested in LVAD but would be open to the possibility of a cardiac transplantation.  She understands that she will have to quit smoking to consider this.  She continues to smoke 1-2 cigars a week.  2.  CAD:  S/P multiple interventions. No chest pain, continue ASA 8. She stopped zocor says she has muscle  aches.  3.  Current smoker: Cigars. We discussed cessation.  She understands she is not a candidate for transplant if she continues.   4. 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  Had presyncope last week. Repeat ECHO. May need RHC.  - On Revatio 20 mg tid, no BP room to titrate.   - No indication for CPAP.  - Based on CT and PFTs, concern for interstitial lung disease. To follow with pulmonary.  - Serologic workup: ANA negative. , anti-SCL70, RF, HIV NR   - 10/2013  V/Q scan--> normal   Refer to  pulmonary rehab but she declined.  5. OSA: Had sleep study. No indication for CPAP per pulmonary    Schedule ECHO. Follow up in 1 month  CLEGG,AMY NP-C  01/28/2014   Patient seen and examined with Tonye BecketAmy Clegg, NP. We discussed all aspects of the encounter. I agree with the assessment and plan as stated above.   Overall stable. 6MW slightly decreased but still ok. Doubt presyncope related to RHF. Will repeat echo.  Volume status looks ok. No evidence of ischemia.  Unable to tolerate simva due to myalgias. Can consider zetia. Reinforced need for daily weights and reviewed use of sliding scale diuretics with metolazone.   Truman HaywardDaniel Makyiah Lie,MD 6:57 PM

## 2014-01-28 NOTE — Patient Instructions (Signed)
Labs today  Your physician recommends that you schedule a follow-up appointment in: 1 month with echo

## 2014-02-03 ENCOUNTER — Telehealth (HOSPITAL_COMMUNITY): Payer: Self-pay | Admitting: *Deleted

## 2014-02-03 MED ORDER — POTASSIUM CHLORIDE CRYS ER 20 MEQ PO TBCR: 60.0000 meq | EXTENDED_RELEASE_TABLET | Freq: Every day | ORAL | Status: DC

## 2014-02-03 NOTE — Telephone Encounter (Signed)
-----   Message from Dolores Pattyaniel R Bensimhon, MD sent at 01/31/2014 10:26 PM EST ----- Add kcl 20 daily (or increase current dose by 20)

## 2014-02-18 ENCOUNTER — Encounter (HOSPITAL_COMMUNITY): Payer: Self-pay | Admitting: Internal Medicine

## 2014-03-02 ENCOUNTER — Other Ambulatory Visit (HOSPITAL_COMMUNITY): Payer: Self-pay | Admitting: Cardiology

## 2014-03-02 DIAGNOSIS — I5022 Chronic systolic (congestive) heart failure: Secondary | ICD-10-CM

## 2014-03-08 ENCOUNTER — Ambulatory Visit (INDEPENDENT_AMBULATORY_CARE_PROVIDER_SITE_OTHER): Payer: Medicare Other | Admitting: *Deleted

## 2014-03-08 DIAGNOSIS — I472 Ventricular tachycardia, unspecified: Secondary | ICD-10-CM

## 2014-03-08 DIAGNOSIS — I5022 Chronic systolic (congestive) heart failure: Secondary | ICD-10-CM

## 2014-03-08 DIAGNOSIS — I4729 Other ventricular tachycardia: Secondary | ICD-10-CM

## 2014-03-08 NOTE — Progress Notes (Signed)
Remote defibrillator check.  

## 2014-03-09 ENCOUNTER — Ambulatory Visit (HOSPITAL_COMMUNITY): Admission: RE | Admit: 2014-03-09 | Payer: Medicare Other | Source: Ambulatory Visit

## 2014-03-09 ENCOUNTER — Encounter (HOSPITAL_COMMUNITY): Payer: Medicare Other

## 2014-03-09 LAB — MDC_IDC_ENUM_SESS_TYPE_REMOTE
Brady Statistic RV Percent Paced: 0 %
HIGH POWER IMPEDANCE MEASURED VALUE: 43 Ohm
Implantable Pulse Generator Model: 540
Lead Channel Pacing Threshold Amplitude: 0.5 V
Lead Channel Sensing Intrinsic Amplitude: 15 mV
Lead Channel Setting Pacing Amplitude: 2.4 V
Lead Channel Setting Pacing Pulse Width: 0.4 ms
MDC IDC MSMT BATTERY VOLTAGE: 2.99 V
MDC IDC MSMT LEADCHNL RV IMPEDANCE VALUE: 468 Ohm
MDC IDC PG SERIAL: 60439356
MDC IDC SET ZONE DETECTION INTERVAL: 300 ms

## 2014-03-16 ENCOUNTER — Encounter: Payer: Self-pay | Admitting: Cardiology

## 2014-03-19 ENCOUNTER — Encounter: Payer: Self-pay | Admitting: Internal Medicine

## 2014-03-25 ENCOUNTER — Ambulatory Visit (HOSPITAL_COMMUNITY)
Admission: RE | Admit: 2014-03-25 | Discharge: 2014-03-25 | Disposition: A | Discharge status: Home / Self Care | Payer: Medicare Other | Source: Ambulatory Visit | Attending: Internal Medicine | Admitting: Internal Medicine

## 2014-03-25 ENCOUNTER — Encounter (HOSPITAL_COMMUNITY): Payer: Self-pay

## 2014-03-25 ENCOUNTER — Ambulatory Visit (HOSPITAL_BASED_OUTPATIENT_CLINIC_OR_DEPARTMENT_OTHER)
Admission: RE | Admit: 2014-03-25 | Discharge: 2014-03-25 | Disposition: A | Discharge status: Home / Self Care | Payer: Medicare Other | Source: Ambulatory Visit | Attending: Internal Medicine | Admitting: Internal Medicine

## 2014-03-25 VITALS — BP 99/64 | HR 101 | Resp 22 | Wt 137.5 lb

## 2014-03-25 DIAGNOSIS — I059 Rheumatic mitral valve disease, unspecified: Secondary | ICD-10-CM

## 2014-03-25 DIAGNOSIS — I272 Pulmonary hypertension, unspecified: Secondary | ICD-10-CM

## 2014-03-25 DIAGNOSIS — I5022 Chronic systolic (congestive) heart failure: Secondary | ICD-10-CM | POA: Diagnosis present

## 2014-03-25 DIAGNOSIS — I213 ST elevation (STEMI) myocardial infarction of unspecified site: Secondary | ICD-10-CM

## 2014-03-25 DIAGNOSIS — I513 Intracardiac thrombosis, not elsewhere classified: Secondary | ICD-10-CM | POA: Insufficient documentation

## 2014-03-25 DIAGNOSIS — I5023 Acute on chronic systolic (congestive) heart failure: Secondary | ICD-10-CM

## 2014-03-25 DIAGNOSIS — I270 Primary pulmonary hypertension: Secondary | ICD-10-CM

## 2014-03-25 MED ORDER — CARVEDILOL 3.125 MG PO TABS: 3.1250 mg | ORAL_TABLET | Freq: Two times a day (BID) | ORAL | Status: DC

## 2014-03-25 MED ORDER — PERFLUTREN LIPID MICROSPHERE
1.0000 mL | Freq: Once | INTRAVENOUS | Status: AC
  Administered 2014-03-25: 6 mL via INTRAVENOUS

## 2014-03-25 MED ORDER — WARFARIN SODIUM 5 MG PO TABS: 5.0000 mg | ORAL_TABLET | ORAL | Status: DC

## 2014-03-25 NOTE — Addendum Note (Signed)
Encounter addended by: Dolores Pattyaniel R Cythnia Osmun, MD on: 03/25/2014  4:37 PM<BR>     Documentation filed: Notes Section

## 2014-03-25 NOTE — Progress Notes (Signed)
Echocardiogram 2D Echocardiogram has been performed.  Jessica [name] 03/25/2014, 4:17 PM

## 2014-03-25 NOTE — Patient Instructions (Signed)
Start Carvedilol 3.125 mg Twice daily   Start Coumadin 5 mg daily  You have been referred to [facility], on Tuesday 1/19 at 10:30 am   We will contact you in 2 months to schedule your next appointment.

## 2014-03-25 NOTE — Addendum Note (Signed)
Encounter addended by: Noralee SpaceHeather M Allice Garro, RN on: 03/25/2014  4:50 PM<BR>     Documentation filed: Dx Association, Patient Instructions Section, Orders

## 2014-03-25 NOTE — Progress Notes (Signed)
Definity needed for echocardiogram per Dr. Gala RomneyBensimhon.  22g PIV started in RAC x 1 attempt.  Patient tolerated well.  6cc total definity given to patient for echocardiogram.  Saline locked.  PIV left in for clinic visit for suspected clot.  Ave FilterBradley, Anwen Cannedy Genevea

## 2014-03-25 NOTE — Progress Notes (Addendum)
Patient ID: Jessica [name], female   DOB: [birth year], 54 y.o.   MRN: [id number] PCP: Dr Dierdre Searles EP: Dr Ladona Ridgel  HPI: Jessica [name] is a 54y.o. with a history of ICM, chronic systolic heart failure EF 25%,  CAD, S/P multiple interventions, ICD Biotronic, DM, , hyperlipidemia, smoker, S/P right iliofemoral embolectomy and compartment fasciotomy of the right leg by Dr. Darrick Penna with LLE DVT 06/2010.  S/P ICD discharge 03/13/13.    She has seen Dr. Kendrick Fries as part of her [facility] workup.  PFTs were done in 7/15 suggesting an interstitial process (restrictive) with markedly decreased DLCO): FVC 74%%, FEV1 81%, ratio 107%, TLC 76%, DLCO 29%. CTA chest in 4/15 showed no PE but did have focal honeycomb changes in the lung bases.    She returns for follow up. At last visit had syncopal episode without clear cause.  Echo today with EF 20-25% with moderate RV dysfunction + LV apical clot. Says she is congested and coughing x 1 week. + nighttime chills.  Chronic exertional dyspnea. + bloating in her belly. No lower extremity edema. No CP, orthopnea or PND.  Stopped smoking in November. She is vaping. Taking medicines except zocor because it makes her feel like she is dying. Weight stable at home 133-135.    RHC 07/23/2013: RA 15, RV 78/5/18, PA 79/31 (47), PCWP 12, Fick CO/CI 2.1/1.3, Thermo CO/CI 3.4/2.0, PVR 16.5, PA 39% and 40%  ECHO 02/22/11: Only basal function preserved. EF 20-25%. ECHO 12/20/2011 EF 15%  ECHO 6/15 EF 25% with diffuse hypokinesis, severe LV dilation, mild MR, RV mildly dilated with moderately decreased systolic function.  ECHO 03/25/14 EF 20-25%. Moderate RV dysfunction + apical clot  6 minute walk (7/15): 384 m 6 MW (01/28/14): 1100 ft (38m), O2 sats ranged from 98-92% and HR ranged from 85-120   Labs: 03/13/13 K 3.8 Creatinine 0.90 03/17/13 K 4.7 Creatinine 0.94  Magnesium 1.6 Started on magnesium 4/15 K 3.7 => 4.6, Creatinine 0.89 => 1.23 Pro BNP 1848  07/14/2013: K 4.1, creatinine 0.88, digoxin 1.0, Pro  BNP 2130 08/05/13 Dig 0.5  6/15 K 3.8, creatinine 1.2, digoxin 0.5, HCT 43.6 09/30/13 HIV NR, ANA negative  ROS: All systems negative except as listed in HPI, PMH and Problem List.  SH: smokes cigar daily, Drinks occasional beer; lives with boyfriend and 2 children in [place].   FH: Mom living: DM2, HTN, CKD  Father deceased: MI, CAD, HTN   Past Medical History  Diagnosis Date  . Chronic systolic heart failure     a.  secondary to severe ischemic cardiomyopathy with EF 20-25%;  b. s/p rimplantation of Biotronik single chamber ICD by Dr Ladona Ridgel 02/2008;  c.  Right heart cath 04/2007: RA 18, PA 56/30 (40), PCWP 36. FICK 3.89/2.06 [place];  d. echo 9/09: Ef 20-25%, mild MR, mild LAE  . CAD (coronary artery disease)     a. s/p multiple PCIs; b. cath 10/07: LAD stent 40%, dx 80% ISR - tx with POBA; OM stent ok; ostial OM 60%; RCA occluded with L-R collats  . Tobacco abuse   . Alcohol abuse   . History of noncompliance with medical treatment   . Diabetes mellitus, type 2   . Dizziness   . HLD (hyperlipidemia)   . DVT (deep venous thrombosis)     coumadin  . Arterial embolism     s/p right iliofemoral embolectomy with fasciotomy  . V-tach   . AICD (automatic cardioverter/defibrillator) present   . Angina   . Asthma   .  Shortness of breath   . Sleep apnea   . CHF (congestive heart failure)   . GERD (gastroesophageal reflux disease)   . Anxiety   . Myocardial infarction   . COPD (chronic obstructive pulmonary disease)     Current Outpatient Prescriptions  Medication Sig Dispense Refill  . albuterol (PROVENTIL HFA;VENTOLIN HFA) 108 (90 BASE) MCG/ACT inhaler Inhale 2 puffs into the lungs every 6 (six) hours as needed for wheezing or shortness of breath. 1 Inhaler 2  . aspirin EC 81 MG tablet Take 1 tablet (81 mg total) by mouth daily.    [name]. DIGOX 250 MCG tablet TAKE 1 TABLET (0.25 MG TOTAL) BY MOUTH DAILY. 30 tablet 5  . magnesium oxide (MAG-OX) 400 (241.3 MG) MG tablet TAKE 1/2 TABLET  (200MG ) EVERY DAY 15 tablet 3  . metolazone (ZAROXOLYN) 2.5 MG tablet Take 1 tablet (2.5 mg total) by mouth once a week. Every Wednesday 15 tablet 10  . potassium chloride SA (KLOR-CON M20) 20 MEQ tablet Take 3 tablets (60 mEq total) by mouth daily. 70 tablet 5  . sildenafil (REVATIO) 20 MG tablet Take 1 tablet (20 mg total) by mouth 3 (three) times daily. 90 tablet 6  . spironolactone (ALDACTONE) 25 MG tablet Take 0.5 tablets (12.5 mg total) by mouth daily. 30 tablet 6  . torsemide (DEMADEX) 20 MG tablet Take 2 tablets (40 mg total) by mouth 2 (two) times daily. 120 tablet 6   No current facility-administered medications for this encounter.      Filed Vitals:   03/25/14 1610  BP: 99/64  Pulse: 101  Resp: 22  Weight: 137 lb 8 oz (62.37 kg)  SpO2: 90%   PHYSICAL EXAM: General: Congested. No resp difficulty. Ambulated in clinic without difficulty.    HEENT:  normal Neck: supple. JVP 5-6 cm.  Carotids 2+ bilaterally; no bruits. No lymphadenopathy or thryomegaly appreciated. Cor: PMI laterally displaced. Regular rate & regular rhythm; wide split S2 Soft S3 Lungs: Clear. Back: multiple ulcerations Abdomen: obese, soft, nontender, non distended.  No hepatosplenomegaly. No bruits or masses. Good bowel sounds. Extremities: no cyanosis, clubbing, rash.  No edema. Neuro: alert & orientedx3, cranial nerves grossly intact. Moves all 4 extremities w/o difficulty. Affect pleasant.   ASSESSMENT & PLAN: 1. Chronic systolic heart failure: Ischemic cardiomyopathy.  Echo (1/16) with EF 20-25% with mod RV dysfunction  S/P Biotronic ICD 2009.   - Continue torsemide 40 mg twice a day with metolazone once a week and prn for weight greater than 140 pounds.  - Will try to add back low dose carvedilol 3.125 bid - Blood pressure too low to add ARB - Continue spironolactone to 12.5 mg daily  - Continue digoxin 0.25 mg daily, last digoxin level 0.5.  - She is not interested in LVAD but would be open to the  possibility of a cardiac transplantation but not good candidate due to noncompliance. 2.  CAD:  S/P multiple interventions. No chest pain, continue ASA 8. She stopped zocor says she has muscle aches. Can try pravastatin at next visit. (I don't want to add/change 3 medications today) 3.  Smoking: has stopped smoking cigars. I congratulated her. She is now vaping.   4. 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   - On Revatio 20 mg tid, no BP room to titrate.   - No indication for CPAP.  - Based on CT and PFTs, concern for interstitial lung disease. To follow with pulmonary.  -  Serologic workup: ANA negative, anti-SCL70, RF, HIV NR   - 10/2013  V/Q scan--> normal   Refer to pulmonary rehab but she declined.  5. OSA: Had sleep study. No indication for CPAP per pulmonary   6. LV thrombus: will restart coumadin. Send to coumadin clinic.   Daniel Bensimhon,MD 4:19 PM

## 2014-04-17 ENCOUNTER — Other Ambulatory Visit (HOSPITAL_COMMUNITY): Payer: Self-pay | Admitting: Adult Health

## 2014-04-28 ENCOUNTER — Ambulatory Visit (INDEPENDENT_AMBULATORY_CARE_PROVIDER_SITE_OTHER): Payer: Medicare Other | Admitting: *Deleted

## 2014-04-28 DIAGNOSIS — I213 ST elevation (STEMI) myocardial infarction of unspecified site: Secondary | ICD-10-CM

## 2014-04-28 DIAGNOSIS — I513 Intracardiac thrombosis, not elsewhere classified: Secondary | ICD-10-CM

## 2014-04-28 DIAGNOSIS — Z5181 Encounter for therapeutic drug level monitoring: Secondary | ICD-10-CM

## 2014-04-28 LAB — POCT INR: INR: 1.4

## 2014-04-28 NOTE — Patient Instructions (Signed)

## 2014-05-05 ENCOUNTER — Encounter (HOSPITAL_COMMUNITY): Payer: Self-pay | Admitting: *Deleted

## 2014-05-05 ENCOUNTER — Emergency Department (HOSPITAL_COMMUNITY): Payer: Medicare Other

## 2014-05-05 ENCOUNTER — Emergency Department (HOSPITAL_COMMUNITY)
Admission: EM | Admit: 2014-05-05 | Discharge: 2014-05-05 | Disposition: A | Discharge status: Home / Self Care | Payer: Medicare Other | Attending: Emergency Medicine | Admitting: Emergency Medicine

## 2014-05-05 ENCOUNTER — Ambulatory Visit (INDEPENDENT_AMBULATORY_CARE_PROVIDER_SITE_OTHER): Payer: Medicare Other | Admitting: *Deleted

## 2014-05-05 DIAGNOSIS — Z8659 Personal history of other mental and behavioral disorders: Secondary | ICD-10-CM | POA: Insufficient documentation

## 2014-05-05 DIAGNOSIS — I5021 Acute systolic (congestive) heart failure: Secondary | ICD-10-CM | POA: Insufficient documentation

## 2014-05-05 DIAGNOSIS — Z720 Tobacco use: Secondary | ICD-10-CM | POA: Diagnosis not present

## 2014-05-05 DIAGNOSIS — Z91040 Latex allergy status: Secondary | ICD-10-CM | POA: Insufficient documentation

## 2014-05-05 DIAGNOSIS — I252 Old myocardial infarction: Secondary | ICD-10-CM | POA: Insufficient documentation

## 2014-05-05 DIAGNOSIS — Z9889 Other specified postprocedural states: Secondary | ICD-10-CM | POA: Insufficient documentation

## 2014-05-05 DIAGNOSIS — Z8669 Personal history of other diseases of the nervous system and sense organs: Secondary | ICD-10-CM | POA: Insufficient documentation

## 2014-05-05 DIAGNOSIS — Z9119 Patient's noncompliance with other medical treatment and regimen: Secondary | ICD-10-CM | POA: Insufficient documentation

## 2014-05-05 DIAGNOSIS — Z86718 Personal history of other venous thrombosis and embolism: Secondary | ICD-10-CM | POA: Diagnosis not present

## 2014-05-05 DIAGNOSIS — Z79899 Other long term (current) drug therapy: Secondary | ICD-10-CM | POA: Diagnosis not present

## 2014-05-05 DIAGNOSIS — I213 ST elevation (STEMI) myocardial infarction of unspecified site: Secondary | ICD-10-CM

## 2014-05-05 DIAGNOSIS — Z7982 Long term (current) use of aspirin: Secondary | ICD-10-CM | POA: Diagnosis not present

## 2014-05-05 DIAGNOSIS — Z7901 Long term (current) use of anticoagulants: Secondary | ICD-10-CM | POA: Diagnosis not present

## 2014-05-05 DIAGNOSIS — E119 Type 2 diabetes mellitus without complications: Secondary | ICD-10-CM | POA: Insufficient documentation

## 2014-05-05 DIAGNOSIS — Z95810 Presence of automatic (implantable) cardiac defibrillator: Secondary | ICD-10-CM | POA: Diagnosis not present

## 2014-05-05 DIAGNOSIS — J45909 Unspecified asthma, uncomplicated: Secondary | ICD-10-CM | POA: Insufficient documentation

## 2014-05-05 DIAGNOSIS — I5022 Chronic systolic (congestive) heart failure: Secondary | ICD-10-CM

## 2014-05-05 DIAGNOSIS — K219 Gastro-esophageal reflux disease without esophagitis: Secondary | ICD-10-CM | POA: Diagnosis not present

## 2014-05-05 DIAGNOSIS — I272 Pulmonary hypertension, unspecified: Secondary | ICD-10-CM | POA: Diagnosis present

## 2014-05-05 DIAGNOSIS — I255 Ischemic cardiomyopathy: Secondary | ICD-10-CM

## 2014-05-05 DIAGNOSIS — R079 Chest pain, unspecified: Secondary | ICD-10-CM | POA: Diagnosis present

## 2014-05-05 DIAGNOSIS — R072 Precordial pain: Secondary | ICD-10-CM

## 2014-05-05 DIAGNOSIS — I513 Intracardiac thrombosis, not elsewhere classified: Secondary | ICD-10-CM | POA: Diagnosis present

## 2014-05-05 DIAGNOSIS — J449 Chronic obstructive pulmonary disease, unspecified: Secondary | ICD-10-CM | POA: Diagnosis not present

## 2014-05-05 DIAGNOSIS — I2510 Atherosclerotic heart disease of native coronary artery without angina pectoris: Secondary | ICD-10-CM | POA: Insufficient documentation

## 2014-05-05 DIAGNOSIS — I5023 Acute on chronic systolic (congestive) heart failure: Secondary | ICD-10-CM

## 2014-05-05 DIAGNOSIS — Z5181 Encounter for therapeutic drug level monitoring: Secondary | ICD-10-CM

## 2014-05-05 HISTORY — DX: Intracardiac thrombosis, not elsewhere classified: I51.3

## 2014-05-05 LAB — BRAIN NATRIURETIC PEPTIDE: B NATRIURETIC PEPTIDE 5: 472.8 pg/mL — AB (ref 0.0–100.0)

## 2014-05-05 LAB — CBC
HCT: 42.1 % (ref 36.0–46.0)
Hemoglobin: 14.5 g/dL (ref 12.0–15.0)
MCH: 28.7 pg (ref 26.0–34.0)
MCHC: 34.4 g/dL (ref 30.0–36.0)
MCV: 83.2 fL (ref 78.0–100.0)
PLATELETS: 159 10*3/uL (ref 150–400)
RBC: 5.06 MIL/uL (ref 3.87–5.11)
RDW: 15.1 % (ref 11.5–15.5)
WBC: 5 10*3/uL (ref 4.0–10.5)

## 2014-05-05 LAB — COMPREHENSIVE METABOLIC PANEL
ALBUMIN: 3.4 g/dL — AB (ref 3.5–5.2)
ALK PHOS: 146 U/L — AB (ref 39–117)
ALT: 13 U/L (ref 0–35)
AST: 20 U/L (ref 0–37)
Anion gap: 8 (ref 5–15)
BUN: 16 mg/dL (ref 6–23)
CHLORIDE: 96 mmol/L (ref 96–112)
CO2: 32 mmol/L (ref 19–32)
Calcium: 9.6 mg/dL (ref 8.4–10.5)
Creatinine, Ser: 1.01 mg/dL (ref 0.50–1.10)
GFR calc Af Amer: 71 mL/min — ABNORMAL LOW (ref >90)
GFR calc non Af Amer: 61 mL/min — ABNORMAL LOW (ref >90)
Glucose, Bld: 220 mg/dL — ABNORMAL HIGH (ref 70–99)
POTASSIUM: 4 mmol/L (ref 3.5–5.1)
Sodium: 136 mmol/L (ref 135–145)
TOTAL PROTEIN: 8.4 g/dL — AB (ref 6.0–8.3)
Total Bilirubin: 0.7 mg/dL (ref 0.3–1.2)

## 2014-05-05 LAB — PROTIME-INR
INR: 2.23 — AB (ref 0.00–1.49)
Prothrombin Time: 24.9 seconds — ABNORMAL HIGH (ref 11.6–15.2)

## 2014-05-05 LAB — I-STAT TROPONIN, ED: Troponin i, poc: 0.02 ng/mL (ref 0.00–0.08)

## 2014-05-05 LAB — POCT INR: INR: 2.5

## 2014-05-05 LAB — DIGOXIN LEVEL: Digoxin Level: 0.7 ng/mL — ABNORMAL LOW (ref 0.8–2.0)

## 2014-05-05 MED ORDER — WARFARIN SODIUM 5 MG PO TABS
5.0000 mg | ORAL_TABLET | Freq: Once | ORAL | Status: DC
  Filled 2014-05-05: qty 1

## 2014-05-05 MED ORDER — WARFARIN - PHARMACIST DOSING INPATIENT: Freq: Every day | Status: DC

## 2014-05-05 MED ORDER — POTASSIUM CHLORIDE CRYS ER 20 MEQ PO TBCR
40.0000 meq | EXTENDED_RELEASE_TABLET | Freq: Once | ORAL | Status: AC
  Administered 2014-05-05: 40 meq via ORAL
  Filled 2014-05-05: qty 2

## 2014-05-05 MED ORDER — FUROSEMIDE 10 MG/ML IJ SOLN
80.0000 mg | Freq: Once | INTRAMUSCULAR | Status: AC
  Administered 2014-05-05: 80 mg via INTRAVENOUS
  Filled 2014-05-05: qty 8

## 2014-05-05 NOTE — ED Notes (Signed)
Patient is a heart patient seen in heart failure clinic.  She was sent to ED for further eval of chest pain and abd swelling.  Patient is also sob at rest.  Patient is taking meds as directed.  Patient reports she has not weighed herself today.  Patient states she is not having chest pain now but did last night

## 2014-05-05 NOTE — Consult Note (Signed)
History and Physical  Patient ID: Jessica [name] MRN: [id number], DOB: 1960-07-24 Date of Encounter: 05/05/2014, 1:51 PM Primary Physician: No PCP Per Patient Primary Cardiologist: Bensimhon (CHF)  Chief Complaint: CP, abdominal swelling  HPI: Jessica [name] is is a 54 y.o. F with a history of chronic systolic CHF/longstanding ICM EF 20-25%, pulmonary hypertension. CAD s/p extensive history of PCI as below, Biotronik ICD placed 2009 with h/o VT, ICD discharge in 2011 and 2015, DM, HLD, HTN, tobacco abuse, right iliofemoral embolectomy and compartment fasciotomy of the right leg by Dr. Darrick PennaFields with LLE DVT 06/2010. [facility] of chest pain and abdominal swelling.  She has [facility] Dr. Kend[facility]McQuaid [facility]. PFTs were done in 7/15 suggesting an interstitial process (restrictive) with markedly decreased DLCO): FVC 74%%, FEV1 81%, ratio 107%, TLC 76%, DLCO 29%. CTA chest in 4/15 showed no PE but did have focal honeycomb changes in the lung bases.Based on CT and PFTs, concern for interstitial lung disease. RHC 07/23/2013: RA 15, RV 78/5/18, PA 79/31 (47), PCWP 12, Fick CO/CI 2.1/1.3, Thermo CO/CI 3.4/2.0, PVR 16.5, PA 39% and 40% well compensated left sided pressures with moderate PAH and low cardiac output. VQ 10/2013 normal. Serologic workup: ANA negative, anti-SCL70, RF high but negative anti-CCP, HIV NR. 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 Last echo 03/2014 EF 20-25%. Moderate RV dysfunction + apical clot, PASP 58mmHg. She has previously not been interested in LVAD but would be open to the possibility of a cardiac transplantation but not good candidate due to noncompliance.  For the last 4 days after she takes her medicine at night she has noticed a central chest pressure similar to her prior heart pain. It lasts 4-5 minutes and resolves spontaneously. It does not radiate and is not associated with any other symptoms. It is made worse  by lying on her right or left side. It is not worse with inspiration, palpitation or exertion. She does report DOE and easy fatiguability with no significant change recently. Weight crept up last week so she took an extra metalazone and it started coming down. She thinks she forgot to take her meds one night a week ago but otherwise reports med compliance. She does endorse sense of abdominal distention. Dry weight 135lb which is where she is at in the ER today. She went to the Coumadin clinic today and reported her CP and was sent to the ER. VSS. Not tachycardic, tachypneic or hypoxic. INR therapeutic at 2.23, troponin neg x1, BNP 472, dig level 0.7.    RHC 07/23/2013: RA 15, RV 78/5/18, PA 79/31 (47), PCWP 12, Fick CO/CI 2.1/1.3, Thermo CO/CI 3.4/2.0, PVR 16.5, PA 39% and 40%  ECHO 02/22/11: Only basal function preserved. EF 20-25%. ECHO 12/20/2011 EF 15%  ECHO 6/15 EF 25% with diffuse hypokinesis, severe LV dilation, mild MR, RV mildly dilated with moderately decreased systolic function.  ECHO 03/25/14 EF 20-25%. Moderate RV dysfunction + apical clot, PASP 58mmHg  6 minute walk (7/15): 384 m 6 MW (01/28/14): 1100 ft (31530m), O2 sats ranged from 98-92% and HR ranged from 85-120  Past Medical History  Diagnosis Date  . Chronic systolic heart failure     a.  secondary to severe ischemic cardiomyopathy with EF 20-25%;  b. s/p rimplantation of Biotronik single chamber ICD by Dr Ladona Ridgelaylor 02/2008;  c.  RHC 07/2013 showed well compensated left sided pressures with moderate PAH and low cardiac  output. d. 2D Echo 03/2014: EF 20-25%, grade 2 DD, PAP .  [name] CAD (coronary artery disease)     a. 1996: anterior wall MI tx with PTCA. b. 2003: PCI/DES to circumflex marginal. c. 2004: PTCA/CBA/stenting of ostium of D1 and LAD. d. 06/2003: s/p PTCA/DES of mRCA. e. 11/2003: CBA of bifurcation diagonal LAD. f. 2007: PTCA/DES to RCA, CBA/stenting to OM2.  . Tobacco abuse   . Alcohol abuse   . History of noncompliance  with medical treatment   . Diabetes mellitus, type 2   . Dizziness   . HLD (hyperlipidemia)   . DVT (deep venous thrombosis)     a. 06/2010.  . Arterial embolism     a. H/o RLE extremity ischemia in 2012 s/p right iliofemoral embolectomy with compartment fasciotomy.  . V-tach   . AICD (automatic cardioverter/defibrillator) present     a. Biotronik placed 2009. b. ICD discharge 2011.  . Asthma   . Sleep apnea   . GERD (gastroesophageal reflux disease)   . Anxiety   . Myocardial infarction   . COPD (chronic obstructive pulmonary disease)   . LV (left ventricular) mural thrombus     a. Dx on echo 03/2014.     Most Recent Cardiac Studies: 2D Echo 03/2014 - Left ventricle: The cavity size was mildly dilated. Systolic function was severely reduced. The estimated ejection fractionwas in the range of 20% to 25%. Diffuse hypokinesis, withrelatively better contractility in the basal segments and nearakinesis of the mid and apical segments.. Features are consistentwith a pseudonormal left ventricular filling pattern, withconcomitant abnormal relaxation and increased filling pressure (grade 2 diastolic dysfunction). Acoustic contrast opacificationreveals a flat, immobile mural thrombus attached to theinferoapical wall, 8 mm in thickness, up to 20 mm in length. - Mitral valve: There was mild regurgitation. - Left atrium: The atrium was moderately dilated. - Right ventricle: Systolic function was moderately reduced. - Pulmonary arteries: Systolic pressure was moderately increased. PA peak pressure: 58 mm Hg (S).  Nuc 02/2011 IMPRESSION: 1. Very difficult study despite attempts at reprocessing. 2. There appear to be anterior and lateral wall defects that are present at baseline and also on the 24 hour scan. There does not appear to be significant redistribution. This suggests nonviable territory in the anterior and lateral walls. However, this study is limited by poor image quality.    Surgical History:  Past Surgical History  Procedure Laterality Date  . Diagnostic laparoscopy    . Cardiac catheterization    . Insert / replace / remove pacemaker    . Tubal ligation    . Right heart catheterization N/A 07/23/2013    Procedure: RIGHT HEART CATH;  Surgeon: Dolores Patty, MD;  Location: [facility] CATH LAB;  Service: Cardiovascular;  Laterality: N/A;     Home Meds: Prior to Admission medications   Medication Sig Start Date End Date Taking? Authorizing Provider  albuterol (PROVENTIL HFA;VENTOLIN HFA) 108 (90 BASE) MCG/ACT inhaler Inhale 2 puffs into the lungs every 6 (six) hours as needed for wheezing or shortness of breath. 10/26/13  Yes Lupita Leash, MD  aspirin EC 81 MG tablet Take 1 tablet (81 mg total) by mouth daily. 10/06/12  Yes Laurey Morale, MD  carvedilol (COREG) 3.125 MG tablet Take 1 tablet (3.125 mg total) by mouth 2 (two) times daily. 03/25/14  Yes Bevelyn Buckles Bensimhon, MD  DIGOX 250 MCG tablet TAKE 1 TABLET (0.25 MG TOTAL) BY MOUTH DAILY.   Yes Dolores Patty, MD  magnesium oxide (  MAG-OX) 400 (241.3 MG) MG tablet TAKE 1/2 TABLET ( ) EVERY DAY 11/19/13  Yes Dolores Patty, MD  metolazone (ZAROXOLYN) 2.5 MG tablet Take 1 tablet (2.5 mg total) by mouth once a week. Every Wednesday 07/14/13  Yes Laurey Morale, MD  potassium chloride SA (KLOR-CON M20) 20 MEQ tablet Take 3 tablets (60 mEq total) by mouth daily. 02/03/14  Yes Dolores Patty, MD  sildenafil (REVATIO) 20 MG tablet Take 1 tablet (20 mg total) by mouth 3 (three) times daily. Patient taking differently: Take 20 mg by mouth 2 (two) times daily.  08/05/13  Yes Aundria Rud, NP  spironolactone (ALDACTONE) 25 MG tablet Take 0.5 tablets (12.5 mg total) by mouth daily. 09/30/13  Yes Laurey Morale, MD  torsemide (DEMADEX) 20 MG tablet Take 2 tablets (40 mg total) by mouth 2 (two) times daily. 07/08/13  Yes Amy D Clegg, NP  warfarin (COUMADIN) 5 MG tablet Take 1 tablet (5 mg total) by mouth as  directed. Patient taking differently: Take 2.5-5 mg by mouth as directed. 2.5 mg on Mon & Fri, 5 mg all other days 03/25/14  Yes Dolores Patty, MD    Allergies:  Allergies  Allergen Reactions  . Nyquil Multi-Symptom [Pseudoeph-Doxylamine-Dm-Apap] Other (See Comments)    Fatigue, chest pressure and pain  . Sudafed [Pseudoephedrine] Other (See Comments)    Fatigue, chest pressure and pain  . Latex Rash  . Ramipril Cough    History   Social History  . Marital Status: Divorced    Spouse Name: N/A  . Number of Children: N/A  . Years of Education: N/A   Occupational History  . disabled    Social History Main Topics  . Smoking status: Current Some Day Smoker -- 0.25 packs/day for 21 years    Types: Cigars  . Smokeless tobacco: Never Used     Comment: Smokes 2-3 cigars daily, quit smoking cigarettes 3 yrs ago.   . Alcohol Use: No  . Drug Use: No  . Sexual Activity: No   Other Topics Concern  . Not on file   Social History Narrative     Family History  Problem Relation Age of Onset  . Coronary artery disease      FMILY HISTORY    Review of Systems: All other systems reviewed and are otherwise negative except as noted above.  Labs:   Lab Results  Component Value Date   WBC 5.0 05/05/2014   HGB 14.5 05/05/2014   HCT 42.1 05/05/2014   MCV 83.2 05/05/2014   PLT 159 05/05/2014    Recent Labs Lab 05/05/14 1100  NA 136  K 4.0  CL 96  CO2 32  BUN 16  CREATININE 1.01  CALCIUM 9.6  PROT 8.4*  BILITOT 0.7  ALKPHOS 146*  ALT 13  AST 20  GLUCOSE 220*   Troponin neg x 1  Lab Results  Component Value Date   CHOL 108 02/21/2011   HDL 20* 02/21/2011   LDLCALC 76 02/21/2011   TRIG 62 02/21/2011     Radiology/Studies:  Dg Chest 2 View  05/05/2014   CLINICAL DATA:  Chest pain.  Sleep apnea  EXAM: CHEST  2 VIEW  COMPARISON:  October 15, 2013  FINDINGS: There is subtle infiltrate in the right upper lobe. Elsewhere lungs are clear. Heart is mildly enlarged  with pulmonary vascularity within normal limits. Pacemaker lead is attached to the right ventricle. No pneumothorax. No adenopathy. No bone lesions.  IMPRESSION: Subtle infiltrate  right upper lobe. Elsewhere lungs clear. Heart mildly enlarged.   Electronically Signed   By: Bretta Bang III M.D.   On: 05/05/2014 12:28   Wt Readings from Last 3 Encounters:  05/05/14 135 lb (61.236 kg)  01/28/14 138 lb (62.596 kg)  11/04/13 135 lb (61.236 kg)    EKG: NSR 87bpm, right axis deviation, no acute ST-T changes  Physical Exam: Blood pressure 110/72, pulse 86, temperature 98 F (36.7 C), temperature source Oral, resp. rate 11, height 5\' 4"  (1.626 m), weight 135 lb (61.236 kg), SpO2 99 %. General: Well developed, well nourished AAF in no acute distress. Head: Normocephalic, atraumatic, sclera non-icteric, no xanthomas, nares are without discharge.  Neck: JVD 10cm. Lungs: Diminished at bases. Otherwise clear bilaterally to auscultation without wheezes, rales, or rhonchi. Breathing is unlabored. Heart: RRR with S1, split S2. No murmurs, rubs, or gallops appreciated. Abdomen: Soft, non-tender, not particularly distended with normoactive bowel sounds. No hepatomegaly. No rebound/guarding. No obvious abdominal masses. Msk:  Strength and tone appear normal for age. Back: with scattered ulcerations and hyperpigmentation. Extremities: No clubbing or cyanosis. No edema.  Distal pedal pulses are 2+ and equal bilaterally. Neuro: Alert and oriented X 3. No focal deficit. No facial asymmetry. Moves all extremities spontaneously. Psych:  Responds to questions appropriately with a normal affect.    ASSESSMENT AND PLAN:   1. Chest pain, atypical 2. Extensive history of CAD s/p PCIs as above 3. Mild acute on chronic systolic CHF/ICM EF 20-25% 4. Pulmonary hypertension, on Revatio 5. H/o apical thrombus and DVT, on chronic Coumadin 6. COPD without active wheezing  Chest pain is mostly atypical. Troponin is  negative and no acute EKG changes. In light of known extensive coronary disease but lack of obective evidence of ischemia, will plan continued medical management. Continue aspirin, BB. H/o myalgias with statin. Neck veins are up a bit thus will give 1 dose of IV Lasix in the ER along with KCl. After that she will be eligible for discharge from the ER. Will plan f/u in the CHF clinic in 2-3 weeks - office will call patient with this appointment.  Signed, Ronie Spies PA-C 05/05/2014, 1:51 PM  Patient seen and examined with Ronie Spies, PA-C. We discussed all aspects of the encounter. I agree with the assessment and plan as stated above. Doubt chest pain is ischemic. No prolonged episodes or exertional component. TROP and ECG ok. She has mild volume overload. Would give one dose of lasix 80mg  IV and kcl 40 meq po. Can be discharged after that. F/u in HF Clinic within next few weeks.   Daniel Bensimhon,MD 3:03 PM

## 2014-05-05 NOTE — H&P (Signed)
See consult note. Dayna Dunn PA-C   

## 2014-05-05 NOTE — ED Notes (Signed)
Patient requesting something to eat, MD Silverio LayYao states patient may have a sandwich

## 2014-05-05 NOTE — Discharge Instructions (Signed)
Take your medicines as prescribed.   Follow up with Dr. Gala RomneyBensimhon.   Return to ER if you have shortness of breath, chest pain, worse swelling.

## 2014-05-05 NOTE — ED Provider Notes (Signed)
CSN: [id number]     Arrival date & time 05/05/14  1039 History   First MD Initiated Contact with Patient 05/05/14 1121     Chief Complaint  Patient presents with  . Chest Pain  . Edema     (Consider location/radiation/quality/duration/timing/severity/associated sxs/prior Treatment) The history is provided by the patient.  Jessica [name] is a 54 y.o. female hx of CAD s/p 7 stents, CHF with EF 20-25% with defibrillator here with chest pain, abdominal swelling. She has been having shortness of breath and chest pain for the last 2 days. She has been gaining weight since last week. She has been having more abdominal swelling and leg swelling. She went to coumadin, who called heart failure clinic, and patient was sent here. At baseline, she has mild SOB at rest. She was unable to sleep well last night due to shortness of breath. Has urticaria at baseline but saw dermatology but never found out the cause. Denies liver problems or alcohol use.    Past Medical History  Diagnosis Date  . Chronic systolic heart failure     a.  secondary to severe ischemic cardiomyopathy with EF 20-25%;  b. s/p rimplantation of Biotronik single chamber ICD by Dr Ladona Ridgel 02/2008;  c.  RHC 07/2013 showed well compensated left sided pressures with moderate PAH and low cardiac output. d. 2D Echo 03/2014: EF 20-25%, grade 2 DD, PAP .  [name] CAD (coronary artery disease)     a. 1996: anterior wall MI tx with PTCA. b. 2003: PCI/DES to circumflex marginal. c. 2004: PTCA/CBA/stenting of ostium of D1 and LAD. d. 06/2003: s/p PTCA/DES of mRCA. e. 11/2003: CBA of bifurcation diagonal LAD. f. 2007: PTCA/DES to RCA, CBA/stenting to OM2.  . Tobacco abuse   . Alcohol abuse   . History of noncompliance with medical treatment   . Diabetes mellitus, type 2   . Dizziness   . HLD (hyperlipidemia)   . DVT (deep venous thrombosis)     a. 06/2010.  . Arterial embolism     a. H/o RLE extremity ischemia in 2012 s/p right iliofemoral  embolectomy with compartment fasciotomy.  . V-tach   . AICD (automatic cardioverter/defibrillator) present     a. Biotronik placed 2009. b. ICD discharge 2011, 03/2013.  [name] Asthma   . Sleep apnea   . GERD (gastroesophageal reflux disease)   . Anxiety   . Myocardial infarction   . COPD (chronic obstructive pulmonary disease)   . LV (left ventricular) mural thrombus     a. Dx on echo 03/2014.   Past Surgical History  Procedure Laterality Date  . Diagnostic laparoscopy    . Cardiac catheterization    . Insert / replace / remove pacemaker    . Tubal ligation    . Right heart catheterization N/A 07/23/2013    Procedure: RIGHT HEART CATH;  Surgeon: Dolores Patty, MD;  Location: [facility] CATH LAB;  Service: Cardiovascular;  Laterality: N/A;   Family History  Problem Relation Age of Onset  . Coronary artery disease      FMILY HISTORY   History  Substance Use Topics  . Smoking status: Current Some Day Smoker -- 0.25 packs/day for 21 years    Types: Cigars  . Smokeless tobacco: Never Used     Comment: Smokes 2-3 cigars daily, quit smoking cigarettes 3 yrs ago.   . Alcohol Use: No   OB History    No data available     Review of Systems  Respiratory: Positive for  shortness of breath.   Cardiovascular: Positive for chest pain and leg swelling.  All other systems reviewed and are negative.     Allergies  Nyquil multi-symptom; Sudafed; Zocor; Latex; and Ramipril  Home Medications   Prior to Admission medications   Medication Sig Start Date End Date Taking? Authorizing Provider  albuterol (PROVENTIL HFA;VENTOLIN HFA) 108 (90 BASE) MCG/ACT inhaler Inhale 2 puffs into the lungs every 6 (six) hours as needed for wheezing or shortness of breath. 10/26/13  Yes Lupita Leash, MD  aspirin EC 81 MG tablet Take 1 tablet (81 mg total) by mouth daily. 10/06/12  Yes Laurey Morale, MD  carvedilol (COREG) 3.125 MG tablet Take 1 tablet (3.125 mg total) by mouth 2 (two) times daily. 03/25/14   Yes Bevelyn Buckles Bensimhon, MD  DIGOX 250 MCG tablet TAKE 1 TABLET (0.25 MG TOTAL) BY MOUTH DAILY.   Yes Dolores Patty, MD  magnesium oxide (MAG-OX) 400 (241.3 MG) MG tablet TAKE 1/2 TABLET ( ) EVERY DAY 11/19/13  Yes Dolores Patty, MD  metolazone (ZAROXOLYN) 2.5 MG tablet Take 1 tablet (2.5 mg total) by mouth once a week. Every Wednesday 07/14/13  Yes Laurey Morale, MD  potassium chloride SA (KLOR-CON M20) 20 MEQ tablet Take 3 tablets (60 mEq total) by mouth daily. 02/03/14  Yes Dolores Patty, MD  sildenafil (REVATIO) 20 MG tablet Take 1 tablet (20 mg total) by mouth 3 (three) times daily. Patient taking differently: Take 20 mg by mouth 2 (two) times daily.  08/05/13  Yes Aundria Rud, NP  spironolactone (ALDACTONE) 25 MG tablet Take 0.5 tablets (12.5 mg total) by mouth daily. 09/30/13  Yes Laurey Morale, MD  torsemide (DEMADEX) 20 MG tablet Take 2 tablets (40 mg total) by mouth 2 (two) times daily. 07/08/13  Yes Amy D Clegg, NP  warfarin (COUMADIN) 5 MG tablet Take 1 tablet (5 mg total) by mouth as directed. Patient taking differently: Take 2.5-5 mg by mouth as directed. 2.5 mg on Mon & Fri, 5 mg all other days 03/25/14  Yes Bevelyn Buckles Bensimhon, MD   BP 106/71 mmHg  Pulse 86  Temp(Src) 98 F (36.7 C) (Oral)  Resp 24  Ht  (1.626 m)  Wt 135 lb (61.236 kg)  BMI 23.16 kg/m2  SpO2 90% Physical Exam  Constitutional: She is oriented to person, place, and time.  Chronically ill,   HENT:  Head: Normocephalic.  Mouth/Throat: Oropharynx is clear and moist.  Eyes: Conjunctivae and EOM are normal. Pupils are equal, round, and reactive to light.  Neck: Normal range of motion. Neck supple.  Cardiovascular: Normal rate, regular rhythm and normal heart sounds.   Pulmonary/Chest:  Crackles bilaterally, + JVD  Abdominal:  Abdominal edema. No abdominal tenderness   Musculoskeletal:  1+ edema   Neurological: She is alert and oriented to person, place, and time. No cranial nerve  deficit. Coordination normal.  Skin: Skin is warm and dry.  Psychiatric: She has a normal mood and affect. Her behavior is normal. Judgment and thought content normal.  Nursing note and vitals reviewed.   ED Course  Procedures (including critical care time) Labs Review Labs Reviewed  BRAIN NATRIURETIC PEPTIDE - Abnormal; Notable for the following:    B Natriuretic Peptide 472.8 (*)    All other components within normal limits  PROTIME-INR - Abnormal; Notable for the following:    Prothrombin Time 24.9 (*)    INR 2.23 (*)    All other components within  normal limits  COMPREHENSIVE METABOLIC PANEL - Abnormal; Notable for the following:    Glucose, Bld 220 (*)    Total Protein 8.4 (*)    Albumin 3.4 (*)    Alkaline Phosphatase 146 (*)    GFR calc non Af Amer 61 (*)    GFR calc Af Amer 71 (*)    All other components within normal limits  DIGOXIN LEVEL - Abnormal; Notable for the following:    Digoxin Level 0.7 (*)    All other components within normal limits  CBC  I-STAT TROPOININ, ED    Imaging Review Dg Chest 2 View  05/05/2014   CLINICAL DATA:  Chest pain.  Sleep apnea  EXAM: CHEST  2 VIEW  COMPARISON:  October 15, 2013  FINDINGS: There is subtle infiltrate in the right upper lobe. Elsewhere lungs are clear. Heart is mildly enlarged with pulmonary vascularity within normal limits. Pacemaker lead is attached to the right ventricle. No pneumothorax. No adenopathy. No bone lesions.  IMPRESSION: Subtle infiltrate right upper lobe. Elsewhere lungs clear. Heart mildly enlarged.   Electronically Signed   By: Bretta BangWilliam  Woodruff III M.D.   On: 05/05/2014 12:28     EKG Interpretation   Date/Time:  Wednesday May 05 2014 10:53:56 EST Ventricular Rate:  87 PR Interval:  158 QRS Duration: 100 QT Interval:  380 QTC Calculation: 457 R Axis:   -133 Text Interpretation:  Normal sinus rhythm Right atrial enlargement Right  superior axis deviation Pulmonary disease pattern Nonspecific ST   abnormality Abnormal ECG No significant change since last tracing  Confirmed by YAO  MD, DAVID ([id number]) on 05/05/2014 11:41:35 AM      MDM   Final diagnoses:  None    Jessica MoccasinSherita Blincoe is a 54 y.o. female here with SOB, CP. Likely CHF exacerbation. Will get BNP, labs CXR. Cardiology consult  3:40 PM BNP nl. CXR showed subtle infiltrate but she has no fever or cough. Cardiology saw patiet and felt that she likely has mild CHF exacerbation. They ordered lasix 80 mg IV. She urinated afterwards. Never hypoxic. Will d/c home.     Richardean Canalavid H Yao, MD 05/05/14 [phone]

## 2014-05-12 ENCOUNTER — Ambulatory Visit (INDEPENDENT_AMBULATORY_CARE_PROVIDER_SITE_OTHER): Payer: Medicare Other | Admitting: Pharmacist

## 2014-05-12 DIAGNOSIS — I513 Intracardiac thrombosis, not elsewhere classified: Secondary | ICD-10-CM

## 2014-05-12 DIAGNOSIS — Z5181 Encounter for therapeutic drug level monitoring: Secondary | ICD-10-CM

## 2014-05-12 DIAGNOSIS — I213 ST elevation (STEMI) myocardial infarction of unspecified site: Secondary | ICD-10-CM

## 2014-05-12 LAB — POCT INR: INR: 3.4

## 2014-05-24 ENCOUNTER — Other Ambulatory Visit (HOSPITAL_COMMUNITY): Payer: Self-pay | Admitting: *Deleted

## 2014-05-24 MED ORDER — WARFARIN SODIUM 5 MG PO TABS: 2.5000 mg | ORAL_TABLET | ORAL | Status: DC

## 2014-05-26 ENCOUNTER — Ambulatory Visit (INDEPENDENT_AMBULATORY_CARE_PROVIDER_SITE_OTHER): Payer: Medicare Other | Admitting: *Deleted

## 2014-05-26 DIAGNOSIS — I213 ST elevation (STEMI) myocardial infarction of unspecified site: Secondary | ICD-10-CM

## 2014-05-26 DIAGNOSIS — I513 Intracardiac thrombosis, not elsewhere classified: Secondary | ICD-10-CM

## 2014-05-26 DIAGNOSIS — Z5181 Encounter for therapeutic drug level monitoring: Secondary | ICD-10-CM

## 2014-05-26 LAB — POCT INR: INR: 1.1

## 2014-05-28 ENCOUNTER — Ambulatory Visit (HOSPITAL_COMMUNITY)
Admission: RE | Admit: 2014-05-28 | Discharge: 2014-05-28 | Disposition: A | Discharge status: Home / Self Care | Payer: Medicare Other | Source: Ambulatory Visit | Attending: Cardiology | Admitting: Cardiology

## 2014-05-28 ENCOUNTER — Encounter (HOSPITAL_COMMUNITY): Payer: Self-pay

## 2014-05-28 VITALS — BP 99/65 | HR 82 | Resp 18 | Wt 134.0 lb

## 2014-05-28 DIAGNOSIS — I272 Other secondary pulmonary hypertension: Secondary | ICD-10-CM | POA: Diagnosis not present

## 2014-05-28 DIAGNOSIS — F419 Anxiety disorder, unspecified: Secondary | ICD-10-CM | POA: Diagnosis not present

## 2014-05-28 DIAGNOSIS — G4733 Obstructive sleep apnea (adult) (pediatric): Secondary | ICD-10-CM | POA: Diagnosis not present

## 2014-05-28 DIAGNOSIS — F17290 Nicotine dependence, other tobacco product, uncomplicated: Secondary | ICD-10-CM | POA: Insufficient documentation

## 2014-05-28 DIAGNOSIS — I252 Old myocardial infarction: Secondary | ICD-10-CM | POA: Diagnosis not present

## 2014-05-28 DIAGNOSIS — Z86718 Personal history of other venous thrombosis and embolism: Secondary | ICD-10-CM | POA: Diagnosis not present

## 2014-05-28 DIAGNOSIS — I2510 Atherosclerotic heart disease of native coronary artery without angina pectoris: Secondary | ICD-10-CM | POA: Insufficient documentation

## 2014-05-28 DIAGNOSIS — Z79899 Other long term (current) drug therapy: Secondary | ICD-10-CM | POA: Diagnosis not present

## 2014-05-28 DIAGNOSIS — E119 Type 2 diabetes mellitus without complications: Secondary | ICD-10-CM | POA: Diagnosis not present

## 2014-05-28 DIAGNOSIS — I513 Intracardiac thrombosis, not elsewhere classified: Secondary | ICD-10-CM

## 2014-05-28 DIAGNOSIS — J449 Chronic obstructive pulmonary disease, unspecified: Secondary | ICD-10-CM | POA: Insufficient documentation

## 2014-05-28 DIAGNOSIS — Z7901 Long term (current) use of anticoagulants: Secondary | ICD-10-CM | POA: Insufficient documentation

## 2014-05-28 DIAGNOSIS — I270 Primary pulmonary hypertension: Secondary | ICD-10-CM

## 2014-05-28 DIAGNOSIS — K219 Gastro-esophageal reflux disease without esophagitis: Secondary | ICD-10-CM | POA: Diagnosis not present

## 2014-05-28 DIAGNOSIS — I5022 Chronic systolic (congestive) heart failure: Secondary | ICD-10-CM

## 2014-05-28 DIAGNOSIS — E785 Hyperlipidemia, unspecified: Secondary | ICD-10-CM | POA: Diagnosis not present

## 2014-05-28 DIAGNOSIS — Z95810 Presence of automatic (implantable) cardiac defibrillator: Secondary | ICD-10-CM | POA: Diagnosis not present

## 2014-05-28 DIAGNOSIS — Z9114 Patient's other noncompliance with medication regimen: Secondary | ICD-10-CM | POA: Insufficient documentation

## 2014-05-28 DIAGNOSIS — I255 Ischemic cardiomyopathy: Secondary | ICD-10-CM | POA: Diagnosis not present

## 2014-05-28 DIAGNOSIS — I213 ST elevation (STEMI) myocardial infarction of unspecified site: Secondary | ICD-10-CM

## 2014-05-28 DIAGNOSIS — I10 Essential (primary) hypertension: Secondary | ICD-10-CM | POA: Insufficient documentation

## 2014-05-28 MED ORDER — SILDENAFIL CITRATE 20 MG PO TABS: 20.0000 mg | ORAL_TABLET | Freq: Two times a day (BID) | ORAL | Status: DC

## 2014-05-28 NOTE — Patient Instructions (Signed)
PLEASE TAKE YOUR COUMADIN.  TAKE Sildenafil twice a day.  FOLLOW UP in 3 months.

## 2014-05-28 NOTE — Progress Notes (Signed)
Patient ID: Jessica MoccasinSherita [name], female   DOB: 1960/06/10, 54 y.o.   MRN: [id number] PCP: Dr Jessica [name] EP: Dr Jessica [name]  HPI: Jessica BurkeSherita is a 54y.o. with a history of ICM, chronic systolic heart failure EF 25%,  CAD, S/P multiple interventions, ICD Biotronic, DM, , hyperlipidemia, smoker, S/P right iliofemoral embolectomy and compartment fasciotomy of the right leg by Dr. Darrick [name] with LLE DVT 06/2010.  S/P ICD discharge 03/13/13.    She has [facility] Dr. Kendrick [name].  PFTs were done in 7/15 suggesting an interstitial process (restrictive) with markedly decreased DLCO): FVC 74%%, FEV1 81%, ratio 107%, TLC 76%, DLCO 29%. CTA chest in 4/15 showed no PE but did have focal honeycomb changes in the lung bases.    She returns for follow up. Since the last visit [facility] and dyspnea. Troponin was negative and she was given IV lasix. Restarted on coumadin and [facility]. Not taking carvedilol this was started last time but she stopped again due to hypotension.   Weight at home 130-132 pounds. Taking metolazone  1-2 times per week.  Denies SOB/PND/Orthopnea. Taking meds except carvedilol. Only taking revatio twice a day.  Restarted coumadin about a week ago.    RHC 07/23/2013: RA 15, RV 78/5/18, PA 79/31 (47), PCWP 12, Fick CO/CI 2.1/1.3, Thermo CO/CI 3.4/2.0, PVR 16.5, PA 39% and 40%  ECHO 02/22/11: Only basal function preserved. EF 20-25%. ECHO 12/20/2011 EF 15%  ECHO 6/15 EF 25% with diffuse hypokinesis, severe LV dilation, mild MR, RV mildly dilated with moderately decreased systolic function.  ECHO 03/25/14 EF 20-25%. Moderate RV dysfunction + apical clot  6 minute walk (7/15): 384 m 6 MW (01/28/14): 1100 ft (38941m), O2 sats ranged from 98-92% and HR ranged from 85-120   Labs: 03/13/13 K 3.8 Creatinine 0.90 03/17/13 K 4.7 Creatinine 0.94  Magnesium 1.6 Started on magnesium 4/15 K 3.7 => 4.6, Creatinine 0.89 => 1.23 Pro BNP 1848  07/14/2013: K 4.1,  creatinine 0.88, digoxin 1.0, Pro BNP 2130 08/05/13 Dig 0.5  6/15 K 3.8, creatinine 1.2, digoxin 0.5, HCT 43.6 09/30/13 HIV NR, ANA negative  ROS: All systems negative except as listed in HPI, PMH and Problem List.  SH: smokes cigar daily, Drinks occasional beer; [place]   FH: Mom living: DM2, HTN, CKD  Father deceased: MI, CAD, HTN   Past Medical History  Diagnosis Date  . Chronic systolic heart failure     a.  secondary to severe ischemic cardiomyopathy with EF 20-25%;  b. s/p rimplantation of Biotronik single chamber ICD by Dr Jessica [name] 02/2008;  c.  RHC 07/2013 showed well compensated left sided pressures with moderate PAH and low cardiac output. d. 2D Echo 03/2014: EF 20-25%, grade 2 DD, PAP 58mmHg.  [name]. CAD (coronary artery disease)     a. 1996: anterior wall MI tx with PTCA. b. 2003: PCI/DES to circumflex marginal. c. 2004: PTCA/CBA/stenting of ostium of D1 and LAD. d. 06/2003: s/p PTCA/DES of mRCA. e. 11/2003: CBA of bifurcation diagonal LAD. f. 2007: PTCA/DES to RCA, CBA/stenting to OM2.  . Tobacco abuse   . Alcohol abuse   . History of noncompliance with medical treatment   . Diabetes mellitus, type 2   . Dizziness   . HLD (hyperlipidemia)   . DVT (deep venous thrombosis)     a. 06/2010.  . Arterial embolism     a. H/o RLE  extremity ischemia in 2012 s/p right iliofemoral embolectomy with compartment fasciotomy.  . V-tach   . AICD (automatic cardioverter/defibrillator) present     a. Biotronik placed 2009. b. ICD discharge 2011, 03/2013.  [name] Asthma   . Sleep apnea   . GERD (gastroesophageal reflux disease)   . Anxiety   . Myocardial infarction   . COPD (chronic obstructive pulmonary disease)   . LV (left ventricular) mural thrombus     a. Dx on echo 03/2014.    Current Outpatient Prescriptions  Medication Sig Dispense Refill  . albuterol (PROVENTIL HFA;VENTOLIN HFA) 108 (90 BASE) MCG/ACT inhaler Inhale 2 puffs into the lungs every 6  (six) hours as needed for wheezing or shortness of breath. 1 Inhaler 2  . DIGOX 250 MCG tablet TAKE 1 TABLET (0.25 MG TOTAL) BY MOUTH DAILY. 30 tablet 5  . magnesium oxide (MAG-OX) 400 (241.3 MG) MG tablet TAKE 1/2 TABLET ( ) EVERY DAY 15 tablet 3  . metolazone (ZAROXOLYN) 2.5 MG tablet Take 1 tablet (2.5 mg total) by mouth once a week. Every Wednesday 15 tablet 10  . potassium chloride SA (KLOR-CON M20) 20 MEQ tablet Take 3 tablets (60 mEq total) by mouth daily. 70 tablet 5  . sildenafil (REVATIO) 20 MG tablet Take 1 tablet (20 mg total) by mouth 3 (three) times daily. (Patient taking differently: Take 20 mg by mouth 2 (two) times daily. ) 90 tablet 6  . spironolactone (ALDACTONE) 25 MG tablet Take 0.5 tablets (12.5 mg total) by mouth daily. 30 tablet 6  . torsemide (DEMADEX) 20 MG tablet Take 2 tablets (40 mg total) by mouth 2 (two) times daily. 120 tablet 6  . warfarin (COUMADIN) 5 MG tablet Take 0.5-1 tablets (2.5-5 mg total) by mouth as directed. 2.5 mg on Mon & Fri, 5 mg all other days 30 tablet 0   No current facility-administered medications for this encounter.      Filed Vitals:   05/28/14 1155  Resp: 18  Weight: 134 lb (60.782 kg)   PHYSICAL EXAM: General:  No resp difficulty. Ambulated in clinic without difficulty.    HEENT:  normal Neck: supple. JVP 5-6 cm.  Carotids 2+ bilaterally; no bruits. No lymphadenopathy or thryomegaly appreciated. Cor: PMI laterally displaced. Regular rate & regular rhythm; wide split S2 Soft S3 Lungs: Clear. Back: multiple ulcerations Abdomen: obese, soft, nontender, non distended.  No hepatosplenomegaly. No bruits or masses. Good bowel sounds. Extremities: no cyanosis, clubbing, rash.  No edema. Neuro: alert & orientedx3, cranial nerves grossly intact. Moves all 4 extremities w/o difficulty. Affect pleasant.   ASSESSMENT & PLAN: 1. Chronic systolic heart failure: Ischemic cardiomyopathy.  Echo (1/16) with EF 20-25% with mod RV dysfunction   S/P Biotronic ICD 2009.   -NYHA III.  Continue torsemide 40 mg twice a day with metolazone once a week and prn for weight greater than 140 pounds.  - Tried carvedilol again but she stopped due to hypotension. Keep off BB.  - Continue spironolactone to 12.5 mg daily  - Continue digoxin 0.25 mg daily, last digoxin level 0.5.  - She is not interested in LVAD but would be open to the possibility of a cardiac transplantation but not good candidate due to noncompliance. 2.  CAD:  S/P multiple interventions. No chest pain, continue ASA 8. She stopped zocor says she has muscle aches. Can try pravastatin at next visit. She wants to hold off. 3.  Smoking: has stopped smoking cigars. Using vapor. g.   3.  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   - On Revatio 20 mg tid, no BP room to titrate.  Needs to take 3 times a day. Encouraged to take 3 times a day.  - No indication for CPAP.  - Based on CT and PFTs, concern for interstitial lung disease. To follow with pulmonary.  - Serologic workup: ANA negative, anti-SCL70, RF, HIV NR   - 10/2013  V/Q scan--> normal   Refer to pulmonary rehab but she declined.  4. OSA: Had sleep study. No indication for CPAP per pulmonary   5. . LV thrombus: Was back on coumadin but stopped after week. She restarted a week ago. Encouraged to continue. [facility] following INR.    Follow up in 3 months  Kaelyn Innocent,NP-C  11:59 AM

## 2014-06-02 ENCOUNTER — Ambulatory Visit (INDEPENDENT_AMBULATORY_CARE_PROVIDER_SITE_OTHER): Payer: Medicare Other | Admitting: *Deleted

## 2014-06-02 DIAGNOSIS — I213 ST elevation (STEMI) myocardial infarction of unspecified site: Secondary | ICD-10-CM

## 2014-06-02 DIAGNOSIS — I513 Intracardiac thrombosis, not elsewhere classified: Secondary | ICD-10-CM

## 2014-06-02 DIAGNOSIS — Z5181 Encounter for therapeutic drug level monitoring: Secondary | ICD-10-CM | POA: Diagnosis not present

## 2014-06-02 LAB — POCT INR: INR: 1.8

## 2014-06-02 MED ORDER — WARFARIN SODIUM 5 MG PO TABS: 5.0000 mg | ORAL_TABLET | ORAL | Status: DC

## 2014-06-07 ENCOUNTER — Ambulatory Visit (INDEPENDENT_AMBULATORY_CARE_PROVIDER_SITE_OTHER): Payer: Medicare Other | Admitting: *Deleted

## 2014-06-07 DIAGNOSIS — I472 Ventricular tachycardia, unspecified: Secondary | ICD-10-CM

## 2014-06-07 NOTE — Progress Notes (Signed)
Remote ICD transmission.   

## 2014-06-08 LAB — MDC_IDC_ENUM_SESS_TYPE_REMOTE
Implantable Pulse Generator Model: 540
Implantable Pulse Generator Serial Number: 60439356

## 2014-06-10 ENCOUNTER — Ambulatory Visit (INDEPENDENT_AMBULATORY_CARE_PROVIDER_SITE_OTHER): Payer: Medicare Other | Admitting: Surgery

## 2014-06-10 DIAGNOSIS — Z5181 Encounter for therapeutic drug level monitoring: Secondary | ICD-10-CM | POA: Diagnosis not present

## 2014-06-10 DIAGNOSIS — I513 Intracardiac thrombosis, not elsewhere classified: Secondary | ICD-10-CM

## 2014-06-10 DIAGNOSIS — I213 ST elevation (STEMI) myocardial infarction of unspecified site: Secondary | ICD-10-CM | POA: Diagnosis not present

## 2014-06-10 LAB — POCT INR: INR: 4.2

## 2014-06-16 ENCOUNTER — Encounter: Payer: Self-pay | Admitting: Cardiology

## 2014-06-17 ENCOUNTER — Ambulatory Visit (INDEPENDENT_AMBULATORY_CARE_PROVIDER_SITE_OTHER): Payer: Medicare Other | Admitting: *Deleted

## 2014-06-17 DIAGNOSIS — I213 ST elevation (STEMI) myocardial infarction of unspecified site: Secondary | ICD-10-CM | POA: Diagnosis not present

## 2014-06-17 DIAGNOSIS — Z5181 Encounter for therapeutic drug level monitoring: Secondary | ICD-10-CM | POA: Diagnosis not present

## 2014-06-17 DIAGNOSIS — I513 Intracardiac thrombosis, not elsewhere classified: Secondary | ICD-10-CM

## 2014-06-17 LAB — POCT INR: INR: 1.8

## 2014-06-22 ENCOUNTER — Encounter: Payer: Self-pay | Admitting: Internal Medicine

## 2014-06-30 ENCOUNTER — Encounter: Payer: Self-pay | Admitting: Cardiology

## 2014-06-30 ENCOUNTER — Other Ambulatory Visit (HOSPITAL_COMMUNITY): Payer: Self-pay | Admitting: Internal Medicine

## 2014-07-01 ENCOUNTER — Ambulatory Visit (INDEPENDENT_AMBULATORY_CARE_PROVIDER_SITE_OTHER): Payer: Medicare Other | Admitting: *Deleted

## 2014-07-01 DIAGNOSIS — Z5181 Encounter for therapeutic drug level monitoring: Secondary | ICD-10-CM

## 2014-07-01 DIAGNOSIS — I213 ST elevation (STEMI) myocardial infarction of unspecified site: Secondary | ICD-10-CM

## 2014-07-01 DIAGNOSIS — I513 Intracardiac thrombosis, not elsewhere classified: Secondary | ICD-10-CM

## 2014-07-01 LAB — POCT INR: INR: 2.9

## 2014-07-02 ENCOUNTER — Other Ambulatory Visit (HOSPITAL_COMMUNITY): Payer: Self-pay | Admitting: *Deleted

## 2014-07-02 MED ORDER — POTASSIUM CHLORIDE CRYS ER 20 MEQ PO TBCR: 60.0000 meq | EXTENDED_RELEASE_TABLET | Freq: Every day | ORAL | Status: DC

## 2014-07-22 ENCOUNTER — Ambulatory Visit (INDEPENDENT_AMBULATORY_CARE_PROVIDER_SITE_OTHER): Payer: Medicare Other | Admitting: *Deleted

## 2014-07-22 DIAGNOSIS — I513 Intracardiac thrombosis, not elsewhere classified: Secondary | ICD-10-CM

## 2014-07-22 DIAGNOSIS — I213 ST elevation (STEMI) myocardial infarction of unspecified site: Secondary | ICD-10-CM

## 2014-07-22 DIAGNOSIS — Z5181 Encounter for therapeutic drug level monitoring: Secondary | ICD-10-CM

## 2014-07-22 LAB — POCT INR: INR: 4.1

## 2014-08-02 ENCOUNTER — Other Ambulatory Visit (HOSPITAL_COMMUNITY): Payer: Self-pay | Admitting: Internal Medicine

## 2014-08-02 ENCOUNTER — Other Ambulatory Visit (HOSPITAL_COMMUNITY): Payer: Self-pay | Admitting: Adult Health

## 2014-08-03 ENCOUNTER — Other Ambulatory Visit (HOSPITAL_COMMUNITY): Payer: Self-pay | Admitting: *Deleted

## 2014-08-05 ENCOUNTER — Ambulatory Visit (INDEPENDENT_AMBULATORY_CARE_PROVIDER_SITE_OTHER): Payer: Medicare Other | Admitting: *Deleted

## 2014-08-05 DIAGNOSIS — Z5181 Encounter for therapeutic drug level monitoring: Secondary | ICD-10-CM

## 2014-08-05 DIAGNOSIS — I213 ST elevation (STEMI) myocardial infarction of unspecified site: Secondary | ICD-10-CM

## 2014-08-05 DIAGNOSIS — I513 Intracardiac thrombosis, not elsewhere classified: Secondary | ICD-10-CM

## 2014-08-05 LAB — POCT INR: INR: 1.4

## 2014-08-23 ENCOUNTER — Encounter: Payer: Self-pay | Admitting: *Deleted

## 2014-09-06 ENCOUNTER — Ambulatory Visit (INDEPENDENT_AMBULATORY_CARE_PROVIDER_SITE_OTHER): Payer: Medicare Other | Admitting: *Deleted

## 2014-09-06 DIAGNOSIS — I472 Ventricular tachycardia, unspecified: Secondary | ICD-10-CM

## 2014-09-06 LAB — CUP PACEART REMOTE DEVICE CHECK
Date Time Interrogation Session: 20160729183402
HIGH POWER IMPEDANCE MEASURED VALUE: 47 Ohm
Lead Channel Impedance Value: 481 Ohm
Lead Channel Pacing Threshold Amplitude: 0.6 V
Lead Channel Sensing Intrinsic Amplitude: 20 mV
MDC IDC MSMT BATTERY VOLTAGE: 2.96 V
MDC IDC MSMT LEADCHNL RV PACING THRESHOLD PULSEWIDTH: 0.4 ms
MDC IDC PG SERIAL: 60439356
MDC IDC STAT BRADY RV PERCENT PACED: 0 %
Pulse Gen Model: 540
Zone Setting Detection Interval: 300 ms

## 2014-09-06 NOTE — Progress Notes (Signed)
Remote ICD transmission.   

## 2014-09-15 ENCOUNTER — Other Ambulatory Visit (HOSPITAL_COMMUNITY): Payer: Self-pay | Admitting: Internal Medicine

## 2014-09-15 ENCOUNTER — Other Ambulatory Visit (HOSPITAL_COMMUNITY): Payer: Self-pay | Admitting: Adult Health

## 2014-10-15 ENCOUNTER — Encounter: Payer: Self-pay | Admitting: Cardiology

## 2014-10-19 ENCOUNTER — Encounter: Payer: Self-pay | Admitting: Internal Medicine

## 2014-11-02 ENCOUNTER — Encounter: Payer: Self-pay | Admitting: Cardiology

## 2014-11-21 ENCOUNTER — Other Ambulatory Visit: Payer: Self-pay | Admitting: Internal Medicine

## 2014-12-17 ENCOUNTER — Encounter (HOSPITAL_COMMUNITY): Payer: Medicare Other | Admitting: Internal Medicine

## 2014-12-30 ENCOUNTER — Encounter (HOSPITAL_COMMUNITY): Payer: Medicare Other | Admitting: Internal Medicine

## 2015-01-11 ENCOUNTER — Encounter (HOSPITAL_COMMUNITY): Payer: Self-pay | Admitting: Internal Medicine

## 2015-01-11 ENCOUNTER — Ambulatory Visit (HOSPITAL_BASED_OUTPATIENT_CLINIC_OR_DEPARTMENT_OTHER)
Admission: RE | Admit: 2015-01-11 | Discharge: 2015-01-11 | Disposition: A | Discharge status: Home / Self Care | Payer: Medicare Other | Source: Ambulatory Visit | Attending: Cardiology | Admitting: Cardiology

## 2015-01-11 VITALS — BP 110/64 | HR 87 | Wt 140.8 lb

## 2015-01-11 DIAGNOSIS — I739 Peripheral vascular disease, unspecified: Secondary | ICD-10-CM | POA: Diagnosis not present

## 2015-01-11 DIAGNOSIS — I5022 Chronic systolic (congestive) heart failure: Secondary | ICD-10-CM

## 2015-01-11 DIAGNOSIS — I272 Other secondary pulmonary hypertension: Secondary | ICD-10-CM | POA: Diagnosis not present

## 2015-01-11 DIAGNOSIS — I5023 Acute on chronic systolic (congestive) heart failure: Secondary | ICD-10-CM | POA: Diagnosis not present

## 2015-01-11 DIAGNOSIS — N179 Acute kidney failure, unspecified: Secondary | ICD-10-CM | POA: Diagnosis not present

## 2015-01-11 DIAGNOSIS — N189 Chronic kidney disease, unspecified: Secondary | ICD-10-CM

## 2015-01-11 DIAGNOSIS — E1122 Type 2 diabetes mellitus with diabetic chronic kidney disease: Secondary | ICD-10-CM | POA: Diagnosis not present

## 2015-01-11 LAB — BASIC METABOLIC PANEL
ANION GAP: 10 (ref 5–15)
BUN: 21 mg/dL — ABNORMAL HIGH (ref 6–20)
CHLORIDE: 94 mmol/L — AB (ref 101–111)
CO2: 30 mmol/L (ref 22–32)
Calcium: 9.4 mg/dL (ref 8.9–10.3)
Creatinine, Ser: 1.27 mg/dL — ABNORMAL HIGH (ref 0.44–1.00)
GFR calc non Af Amer: 46 mL/min — ABNORMAL LOW (ref >60)
GFR, EST AFRICAN AMERICAN: 54 mL/min — AB (ref >60)
Glucose, Bld: 201 mg/dL — ABNORMAL HIGH (ref 65–99)
POTASSIUM: 3.9 mmol/L (ref 3.5–5.1)
Sodium: 134 mmol/L — ABNORMAL LOW (ref 135–145)

## 2015-01-11 LAB — CBC
HEMATOCRIT: 40.2 % (ref 36.0–46.0)
HEMOGLOBIN: 13.1 g/dL (ref 12.0–15.0)
MCH: 27.4 pg (ref 26.0–34.0)
MCHC: 32.6 g/dL (ref 30.0–36.0)
MCV: 84.1 fL (ref 78.0–100.0)
Platelets: 157 10*3/uL (ref 150–400)
RBC: 4.78 MIL/uL (ref 3.87–5.11)
RDW: 15.9 % — ABNORMAL HIGH (ref 11.5–15.5)
WBC: 4.8 10*3/uL (ref 4.0–10.5)

## 2015-01-11 LAB — DIGOXIN LEVEL: DIGOXIN LVL: 0.9 ng/mL (ref 0.8–2.0)

## 2015-01-11 LAB — PROTIME-INR
INR: 1.34 (ref 0.00–1.49)
Prothrombin Time: 16.7 seconds — ABNORMAL HIGH (ref 11.6–15.2)

## 2015-01-11 MED ORDER — METOLAZONE 2.5 MG PO TABS: ORAL_TABLET | ORAL | Status: DC

## 2015-01-11 MED ORDER — PRAVASTATIN SODIUM 40 MG PO TABS: 40.0000 mg | ORAL_TABLET | Freq: Every evening | ORAL | Status: DC

## 2015-01-11 MED ORDER — TORSEMIDE 20 MG PO TABS
60.0000 mg | ORAL_TABLET | Freq: Two times a day (BID) | ORAL | Status: DC
Start: 2015-01-11 — End: 2015-01-15

## 2015-01-11 MED ORDER — POTASSIUM CHLORIDE CRYS ER 20 MEQ PO TBCR: EXTENDED_RELEASE_TABLET | ORAL | Status: DC

## 2015-01-11 MED ORDER — SILDENAFIL CITRATE 20 MG PO TABS: 20.0000 mg | ORAL_TABLET | Freq: Three times a day (TID) | ORAL | Status: DC

## 2015-01-11 NOTE — Progress Notes (Signed)
Advanced Heart Failure Clinic Note   Patient ID: Jessica [name], female   DOB: 1960/12/07, 54 y.o.   MRN: [id number] PCP: Dr Dierdre Searles EP: Dr Ladona Ridgel HF: Dr. Gala Romney  HPI: Jessica [name] is a 54 y.o. with a history of ICM, chronic systolic heart failure EF 25%,  CAD s/p multiple interventions, ICD Biotronic, DM, hyperlipidemia, smoker, S/P right iliofemoral embolectomy and compartment fasciotomy of the right leg by Dr. Darrick Penna with LLE DVT 06/2010.  S/P ICD discharge 03/13/13.    She has seen Dr. Kendrick Fries as part of her [facility] workup.  PFTs were done in 7/15 suggesting an interstitial process (restrictive) with markedly decreased DLCO): FVC 74%%, FEV1 81%, ratio 107%, TLC 76%, DLCO 29%. CTA chest in 4/15 showed no PE but did have focal honeycomb changes in the lung bases.    She returns today for regular follow up. Weight up 6 lbs from last visit. Says she has had increase swelling in feet and SOB x 3 weeks. Nebulizer seems to help.  Walking from car to house gets her SOB, just walking in house gets her short of breath.  +orthopnea.  Is taking coumadin but is not following up with [facility] INR checks.  Left foot bothering her with pain x 2 days, worse with standing, R foot did same thing last week.  Pain only in feet.  Taking all medications. Only taking Revatio twice a day still.   RHC 07/23/2013: RA 15, RV 78/5/18, PA 79/31 (47), PCWP 12, Fick CO/CI 2.1/1.3, Thermo CO/CI 3.4/2.0, PVR 16.5, PA 39% and 40%  ECHO 02/22/11: Only basal function preserved. EF 20-25%. ECHO 12/20/2011 EF 15%  ECHO 6/15 EF 25% with diffuse hypokinesis, severe LV dilation, mild MR, RV mildly dilated with moderately decreased systolic function.  ECHO 03/25/14 EF 20-25%. Moderate RV dysfunction + apical clot  6 minute walk (7/15): 384 m 6 MW (01/28/14): 1100 ft (375m), O2 sats ranged from 98-92% and HR ranged from 85-120  ECG: NSR, PVCs, left axis deviation  Labs: 03/13/13 K 3.8 Creatinine 0.90 03/17/13 K 4.7 Creatinine 0.94  Magnesium 1.6  Started on magnesium 4/15 K 3.7 => 4.6, Creatinine 0.89 => 1.23 Pro BNP 1848  07/14/2013: K 4.1, creatinine 0.88, digoxin 1.0, Pro BNP 2130 08/05/13 Dig 0.5  6/15 K 3.8, creatinine 1.2, digoxin 0.5, HCT 43.6 09/30/13 HIV NR, ANA negative 2/16 digoxin 0.7, K 4, creatinine 1.01, HCT 42.1  ROS: All systems negative except as listed in HPI, PMH and Problem List.  SH: smokes cigar daily, Drinks occasional beer; lives with boyfriend and 2 children in [place].   FH: Mom living: DM2, HTN, CKD  Father deceased: MI, CAD, HTN   Past Medical History  Diagnosis Date  . Chronic systolic heart failure (HCC)     a.  secondary to severe ischemic cardiomyopathy with EF 20-25%;  b. s/p rimplantation of Biotronik single chamber ICD by Dr Ladona Ridgel 02/2008;  c.  RHC 07/2013 showed well compensated left sided pressures with moderate PAH and low cardiac output. d. 2D Echo 03/2014: EF 20-25%, grade 2 DD, PAP .  [name] CAD (coronary artery disease)     a. 1996: anterior wall MI tx with PTCA. b. 2003: PCI/DES to circumflex marginal. c. 2004: PTCA/CBA/stenting of ostium of D1 and LAD. d. 06/2003: s/p PTCA/DES of mRCA. e. 11/2003: CBA of bifurcation diagonal LAD. f. 2007: PTCA/DES to RCA, CBA/stenting to OM2.  . Tobacco abuse   . Alcohol abuse   . History of noncompliance with medical treatment   .  Diabetes mellitus, type 2 (HCC)   . Dizziness   . HLD (hyperlipidemia)   . DVT (deep venous thrombosis) (HCC)     a. 06/2010.  . Arterial embolism (HCC)     a. H/o RLE extremity ischemia in 2012 s/p right iliofemoral embolectomy with compartment fasciotomy.  . V-tach (HCC)   . AICD (automatic cardioverter/defibrillator) present     a. Biotronik placed 2009. b. ICD discharge 2011, 03/2013.  [name]. Asthma   . Sleep apnea   . GERD (gastroesophageal reflux disease)   . Anxiety   . Myocardial infarction (HCC)   . COPD (chronic obstructive pulmonary disease) (HCC)   . LV (left ventricular) mural thrombus (HCC)     a. Dx on echo  03/2014.    Current Outpatient Prescriptions  Medication Sig Dispense Refill  . albuterol (PROVENTIL HFA;VENTOLIN HFA) 108 (90 BASE) MCG/ACT inhaler Inhale 2 puffs into the lungs every 6 (six) hours as needed for wheezing or shortness of breath. 1 Inhaler 2  . digoxin (LANOXIN) 0.25 MG tablet TAKE 1 TABLET (0.25 MG TOTAL) BY MOUTH DAILY. 30 tablet 5  . KLOR-CON M20 20 MEQ tablet TAKE 2 TABLETS BY MOUTH DAILY. MAY TAKE EXTRA TAB IF YOU USE METOLAZONE 70 tablet 5  . magnesium oxide (MAG-OX) 400 (241.3 MG) MG tablet TAKE 1/2 TABLET (200MG ) EVERY DAY 15 tablet 3  . metolazone (ZAROXOLYN) 2.5 MG tablet Take 1 tablet (2.5 mg total) by mouth once a week. Every Wednesday 15 tablet 10  . sildenafil (REVATIO) 20 MG tablet Take 1 tablet (20 mg total) by mouth 2 (two) times daily. 90 tablet 6  . spironolactone (ALDACTONE) 25 MG tablet TAKE 1 TABLET (25 MG TOTAL) BY MOUTH DAILY. 30 tablet 4  . torsemide (DEMADEX) 20 MG tablet TAKE 2 TABLETS (40 MG TOTAL) BY MOUTH 2 (TWO) TIMES DAILY. 120 tablet 4  . warfarin (COUMADIN) 5 MG tablet Take 1 tablet (5 mg total) by mouth as directed. Take As Directed by Coumadin Clinic 30 tablet 1   No current facility-administered medications for this encounter.      Filed Vitals:   01/11/15 1054  BP: 110/64  Pulse: 87  Weight: 140 lb 12.8 oz (63.866 kg)  SpO2: 93%   Wt Readings from Last 3 Encounters:  01/11/15 140 lb 12.8 oz (63.866 kg)  05/28/14 134 lb (60.782 kg)  05/05/14 135 lb (61.236 kg)    PHYSICAL EXAM: General:  NAD   HEENT:  Abrasion on bridge of nose. Neck: supple. JVP 10-12 cm.  Carotids 2+ bilaterally; no bruits. No thyromegaly or nodule Cor: PMI laterally displaced. RRR; wide split S2, soft S3 Lungs: CTA, increase effort Abdomen: obese, soft, NT, ND, no HSM. No bruits or masses. +BS Extremities: no cyanosis, clubbing, rash.  Trace ankle edema. Absent bilateral pedal pulses  Neuro: alert & orientedx3, cranial nerves grossly intact. Moves all 4  extremities w/o difficulty. Affect pleasant.  ASSESSMENT & PLAN: 1. Chronic systolic heart failure: Ischemic cardiomyopathy.  Echo (1/16) with EF 20-25% with mod RV dysfunction  S/P Biotronic ICD 2009.  NYHA III.  She is volume overloaded on exam.  - Increase torsemide 60 mg BID and metolazone increased to Wednesday and Saturday with extra 20 meq of potassium on metolazone days.  Increase daily KCl to 40 qam/20 qpm.  - Intolerant to BBs with hypotension even at low dose.  ACEI likely will be limited by same issue.   - Continue spironolactone 25 mg daily  - Continue digoxin 0.25 mg  daily, last digoxin level 0.5. Will check today.  - She is not interested in LVAD but would be open to the possibility of a cardiac transplantation but not good candidate due to noncompliance. 2.  CAD: S/P multiple interventions. No chest pain.  - Continue ASA 8. - Stopped zocor with cost and muscle aches.  Will try pravastatin 40 mg daily, needs lipids/LFTs in 2 months.    - No longer smoking cigars. Using vapor. 3.  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   - Increase Revatio to 20 mg tid.   - No indication for CPAP on sleep study.  - Based on CT and PFTs, concern for interstitial lung disease. Follows with pulmonary.  - Serologic workup: ANA negative, anti-SCL70, RF, HIV NR   - 10/2013  V/Q scan--> normal   - Referred to pulmonary rehab but she declined.  4. LV thrombus: On coumadin. Non-compliant with INR checks at [facility] - Stressed importance of regular follow up in coumadin clinic and will schedule. Check INR today.  5. Foot pain:  With diminished pulses, will get arterial dopplers.  With coumadin noncompliance will get venous US also to r/o DVT.   Jessica Dollar Tillery PA-C 11:02 AM   Patient seen with PA, agree with the above note.   - She is volume overloaded and symptomatic.  Increase diuretics as above.  Follow BMET.  - She needs to increase Revatio to tid.   - Add  pravastatin given CAD and difficulty with statin myalgias.   - Needs to re-establish with coumadin clinic, will arrange.  - Venous and arterial dopplers of the right lower leg as above.  - Close followup => return 1 week.   Jessica [name] 01/11/2015

## 2015-01-11 NOTE — Progress Notes (Signed)
*  Preliminary Results* Bilateral lower extremity venous duplex completed. Bilateral lower extremities are negative for deep vein thrombosis. There is no evidence of Baker's cyst bilaterally.  01/11/2015  Gertie FeyMichelle Jeraldean Wechter, RVT, RDCS, RDMS

## 2015-01-11 NOTE — Patient Instructions (Signed)
Increase Potassium 40 meq in the am and 20 meq in the evening  Take Metolazone on Wednesday and Saturday and take an extra K 20 meq on these days  Increase Torsemide 60 mg (3 tablets) twice a day  Start Pravastatin 40 mg daily  Please take Sildenafil three times a day  Will get dopplers today if possible   Will schedule you for a coumadin clinic follow up  We will see you back in 1 week

## 2015-01-13 ENCOUNTER — Encounter (HOSPITAL_COMMUNITY): Payer: Self-pay | Admitting: *Deleted

## 2015-01-13 ENCOUNTER — Inpatient Hospital Stay (HOSPITAL_COMMUNITY)
Admission: EM | Admit: 2015-01-13 | Discharge: 2015-01-15 | DRG: 292 | Disposition: A | Discharge status: Home / Self Care | Payer: Medicare Other | Attending: Internal Medicine | Admitting: Internal Medicine

## 2015-01-13 ENCOUNTER — Telehealth (HOSPITAL_COMMUNITY): Payer: Self-pay | Admitting: *Deleted

## 2015-01-13 ENCOUNTER — Emergency Department (HOSPITAL_COMMUNITY): Payer: Medicare Other

## 2015-01-13 DIAGNOSIS — Z91040 Latex allergy status: Secondary | ICD-10-CM | POA: Diagnosis not present

## 2015-01-13 DIAGNOSIS — I272 Other secondary pulmonary hypertension: Secondary | ICD-10-CM | POA: Diagnosis present

## 2015-01-13 DIAGNOSIS — Z955 Presence of coronary angioplasty implant and graft: Secondary | ICD-10-CM

## 2015-01-13 DIAGNOSIS — I513 Intracardiac thrombosis, not elsewhere classified: Secondary | ICD-10-CM | POA: Diagnosis present

## 2015-01-13 DIAGNOSIS — K219 Gastro-esophageal reflux disease without esophagitis: Secondary | ICD-10-CM | POA: Diagnosis present

## 2015-01-13 DIAGNOSIS — N182 Chronic kidney disease, stage 2 (mild): Secondary | ICD-10-CM | POA: Diagnosis present

## 2015-01-13 DIAGNOSIS — F1010 Alcohol abuse, uncomplicated: Secondary | ICD-10-CM | POA: Diagnosis present

## 2015-01-13 DIAGNOSIS — G4730 Sleep apnea, unspecified: Secondary | ICD-10-CM | POA: Diagnosis present

## 2015-01-13 DIAGNOSIS — L0100 Impetigo, unspecified: Secondary | ICD-10-CM

## 2015-01-13 DIAGNOSIS — Z888 Allergy status to other drugs, medicaments and biological substances status: Secondary | ICD-10-CM | POA: Diagnosis not present

## 2015-01-13 DIAGNOSIS — N179 Acute kidney failure, unspecified: Secondary | ICD-10-CM | POA: Diagnosis present

## 2015-01-13 DIAGNOSIS — M7989 Other specified soft tissue disorders: Secondary | ICD-10-CM | POA: Diagnosis present

## 2015-01-13 DIAGNOSIS — E785 Hyperlipidemia, unspecified: Secondary | ICD-10-CM | POA: Diagnosis present

## 2015-01-13 DIAGNOSIS — J45909 Unspecified asthma, uncomplicated: Secondary | ICD-10-CM | POA: Diagnosis present

## 2015-01-13 DIAGNOSIS — Z7901 Long term (current) use of anticoagulants: Secondary | ICD-10-CM

## 2015-01-13 DIAGNOSIS — I252 Old myocardial infarction: Secondary | ICD-10-CM

## 2015-01-13 DIAGNOSIS — E119 Type 2 diabetes mellitus without complications: Secondary | ICD-10-CM | POA: Diagnosis present

## 2015-01-13 DIAGNOSIS — E1165 Type 2 diabetes mellitus with hyperglycemia: Secondary | ICD-10-CM | POA: Diagnosis present

## 2015-01-13 DIAGNOSIS — Z9119 Patient's noncompliance with other medical treatment and regimen: Secondary | ICD-10-CM | POA: Diagnosis not present

## 2015-01-13 DIAGNOSIS — J449 Chronic obstructive pulmonary disease, unspecified: Secondary | ICD-10-CM | POA: Diagnosis present

## 2015-01-13 DIAGNOSIS — Z86718 Personal history of other venous thrombosis and embolism: Secondary | ICD-10-CM

## 2015-01-13 DIAGNOSIS — R1110 Vomiting, unspecified: Secondary | ICD-10-CM | POA: Diagnosis present

## 2015-01-13 DIAGNOSIS — I2510 Atherosclerotic heart disease of native coronary artery without angina pectoris: Secondary | ICD-10-CM | POA: Diagnosis present

## 2015-01-13 DIAGNOSIS — Z95810 Presence of automatic (implantable) cardiac defibrillator: Secondary | ICD-10-CM | POA: Diagnosis present

## 2015-01-13 DIAGNOSIS — F17200 Nicotine dependence, unspecified, uncomplicated: Secondary | ICD-10-CM | POA: Diagnosis present

## 2015-01-13 DIAGNOSIS — F17210 Nicotine dependence, cigarettes, uncomplicated: Secondary | ICD-10-CM | POA: Diagnosis present

## 2015-01-13 DIAGNOSIS — I255 Ischemic cardiomyopathy: Secondary | ICD-10-CM | POA: Diagnosis present

## 2015-01-13 DIAGNOSIS — E871 Hypo-osmolality and hyponatremia: Secondary | ICD-10-CM | POA: Diagnosis present

## 2015-01-13 DIAGNOSIS — I5023 Acute on chronic systolic (congestive) heart failure: Principal | ICD-10-CM | POA: Diagnosis present

## 2015-01-13 DIAGNOSIS — N183 Chronic kidney disease, stage 3 (moderate): Secondary | ICD-10-CM | POA: Diagnosis not present

## 2015-01-13 DIAGNOSIS — IMO0002 Reserved for concepts with insufficient information to code with codable children: Secondary | ICD-10-CM

## 2015-01-13 DIAGNOSIS — F419 Anxiety disorder, unspecified: Secondary | ICD-10-CM | POA: Diagnosis present

## 2015-01-13 DIAGNOSIS — E1122 Type 2 diabetes mellitus with diabetic chronic kidney disease: Secondary | ICD-10-CM | POA: Diagnosis present

## 2015-01-13 HISTORY — DX: Chronic kidney disease, stage 2 (mild): N18.2

## 2015-01-13 HISTORY — DX: Pulmonary hypertension, unspecified: I27.20

## 2015-01-13 LAB — BASIC METABOLIC PANEL
Anion gap: 10 (ref 5–15)
BUN: 22 mg/dL — AB (ref 6–20)
CALCIUM: 9.1 mg/dL (ref 8.9–10.3)
CHLORIDE: 92 mmol/L — AB (ref 101–111)
CO2: 29 mmol/L (ref 22–32)
CREATININE: 1.55 mg/dL — AB (ref 0.44–1.00)
GFR, EST AFRICAN AMERICAN: 42 mL/min — AB (ref >60)
GFR, EST NON AFRICAN AMERICAN: 36 mL/min — AB (ref >60)
GLUCOSE: 283 mg/dL — AB (ref 65–99)
POTASSIUM: 4 mmol/L (ref 3.5–5.1)
SODIUM: 131 mmol/L — AB (ref 135–145)

## 2015-01-13 LAB — CBC
HCT: 39.1 % (ref 36.0–46.0)
HEMOGLOBIN: 13.2 g/dL (ref 12.0–15.0)
MCH: 28 pg (ref 26.0–34.0)
MCHC: 33.8 g/dL (ref 30.0–36.0)
MCV: 83 fL (ref 78.0–100.0)
PLATELETS: 172 10*3/uL (ref 150–400)
RBC: 4.71 MIL/uL (ref 3.87–5.11)
RDW: 15.7 % — AB (ref 11.5–15.5)
WBC: 4.9 10*3/uL (ref 4.0–10.5)

## 2015-01-13 LAB — I-STAT TROPONIN, ED: TROPONIN I, POC: 0.03 ng/mL (ref 0.00–0.08)

## 2015-01-13 LAB — PROTIME-INR
INR: 1.27 (ref 0.00–1.49)
PROTHROMBIN TIME: 16 s — AB (ref 11.6–15.2)

## 2015-01-13 LAB — GLUCOSE, CAPILLARY: Glucose-Capillary: 324 mg/dL — ABNORMAL HIGH (ref 65–99)

## 2015-01-13 LAB — BRAIN NATRIURETIC PEPTIDE: B Natriuretic Peptide: 787.6 pg/mL — ABNORMAL HIGH (ref 0.0–100.0)

## 2015-01-13 MED ORDER — HYDROCORTISONE 0.5 % EX CREA
TOPICAL_CREAM | Freq: Three times a day (TID) | CUTANEOUS | Status: DC
  Administered 2015-01-13 – 2015-01-15 (×5): via TOPICAL
  Filled 2015-01-13 (×2): qty 28.35

## 2015-01-13 MED ORDER — WARFARIN - PHARMACIST DOSING INPATIENT: Freq: Every day | Status: DC

## 2015-01-13 MED ORDER — PNEUMOCOCCAL VAC POLYVALENT 25 MCG/0.5ML IJ INJ
0.5000 mL | INJECTION | INTRAMUSCULAR | Status: AC
  Administered 2015-01-14: 0.5 mL via INTRAMUSCULAR
  Filled 2015-01-13 (×2): qty 0.5

## 2015-01-13 MED ORDER — ACETAMINOPHEN 325 MG PO TABS
650.0000 mg | ORAL_TABLET | ORAL | Status: DC | PRN
  Administered 2015-01-13 – 2015-01-14 (×4): 650 mg via ORAL
  Filled 2015-01-13 (×4): qty 2

## 2015-01-13 MED ORDER — SODIUM CHLORIDE 0.9 % IJ SOLN
3.0000 mL | Freq: Two times a day (BID) | INTRAMUSCULAR | Status: DC
  Administered 2015-01-13 – 2015-01-15 (×4): 3 mL via INTRAVENOUS

## 2015-01-13 MED ORDER — SPIRONOLACTONE 25 MG PO TABS
25.0000 mg | ORAL_TABLET | Freq: Every day | ORAL | Status: DC
  Administered 2015-01-13 – 2015-01-15 (×3): 25 mg via ORAL
  Filled 2015-01-13 (×3): qty 1

## 2015-01-13 MED ORDER — DOXYCYCLINE HYCLATE 100 MG PO TABS
100.0000 mg | ORAL_TABLET | Freq: Two times a day (BID) | ORAL | Status: DC
  Administered 2015-01-13 – 2015-01-15 (×4): 100 mg via ORAL
  Filled 2015-01-13 (×4): qty 1

## 2015-01-13 MED ORDER — INSULIN ASPART 100 UNIT/ML ~~LOC~~ SOLN
0.0000 [IU] | SUBCUTANEOUS | Status: DC
  Administered 2015-01-13: 3 [IU] via SUBCUTANEOUS
  Administered 2015-01-14 (×2): 5 [IU] via SUBCUTANEOUS
  Administered 2015-01-14: 1 [IU] via SUBCUTANEOUS
  Administered 2015-01-15 (×4): 2 [IU] via SUBCUTANEOUS

## 2015-01-13 MED ORDER — ALBUTEROL SULFATE HFA 108 (90 BASE) MCG/ACT IN AERS: 2.0000 | INHALATION_SPRAY | Freq: Four times a day (QID) | RESPIRATORY_TRACT | Status: DC | PRN

## 2015-01-13 MED ORDER — PRAVASTATIN SODIUM 40 MG PO TABS
40.0000 mg | ORAL_TABLET | Freq: Every evening | ORAL | Status: DC
  Administered 2015-01-13 – 2015-01-14 (×2): 40 mg via ORAL
  Filled 2015-01-13 (×2): qty 1

## 2015-01-13 MED ORDER — WARFARIN SODIUM 5 MG PO TABS: 5.0000 mg | ORAL_TABLET | Freq: Once | ORAL | Status: DC

## 2015-01-13 MED ORDER — SODIUM CHLORIDE 0.9 % IJ SOLN: 3.0000 mL | INTRAMUSCULAR | Status: DC | PRN

## 2015-01-13 MED ORDER — COUMADIN BOOK
Freq: Once | Status: AC
  Administered 2015-01-13: 23:00:00
  Filled 2015-01-13: qty 1

## 2015-01-13 MED ORDER — ONDANSETRON HCL 4 MG/2ML IJ SOLN: 4.0000 mg | Freq: Four times a day (QID) | INTRAMUSCULAR | Status: DC | PRN

## 2015-01-13 MED ORDER — WARFARIN - PHARMACIST DOSING INPATIENT
Freq: Every day | Status: DC
  Administered 2015-01-14: 18:00:00

## 2015-01-13 MED ORDER — FUROSEMIDE 10 MG/ML IJ SOLN
80.0000 mg | Freq: Two times a day (BID) | INTRAMUSCULAR | Status: DC
  Administered 2015-01-13 – 2015-01-15 (×4): 80 mg via INTRAVENOUS
  Filled 2015-01-13 (×4): qty 8

## 2015-01-13 MED ORDER — SILDENAFIL CITRATE 20 MG PO TABS
20.0000 mg | ORAL_TABLET | Freq: Three times a day (TID) | ORAL | Status: DC
  Administered 2015-01-13 – 2015-01-15 (×5): 20 mg via ORAL
  Filled 2015-01-13 (×5): qty 1

## 2015-01-13 MED ORDER — DIGOXIN 250 MCG PO TABS
0.2500 mg | ORAL_TABLET | Freq: Every day | ORAL | Status: DC
  Administered 2015-01-13 – 2015-01-15 (×3): 0.25 mg via ORAL
  Filled 2015-01-13 (×3): qty 1

## 2015-01-13 MED ORDER — POTASSIUM CHLORIDE CRYS ER 20 MEQ PO TBCR
40.0000 meq | EXTENDED_RELEASE_TABLET | Freq: Every day | ORAL | Status: DC
  Administered 2015-01-13 – 2015-01-15 (×3): 40 meq via ORAL
  Filled 2015-01-13 (×3): qty 2

## 2015-01-13 MED ORDER — SODIUM CHLORIDE 0.9 % IV SOLN: 250.0000 mL | INTRAVENOUS | Status: DC | PRN

## 2015-01-13 MED ORDER — ALBUTEROL SULFATE (2.5 MG/3ML) 0.083% IN NEBU: 2.5000 mg | INHALATION_SOLUTION | Freq: Four times a day (QID) | RESPIRATORY_TRACT | Status: DC | PRN

## 2015-01-13 MED ORDER — WARFARIN SODIUM 5 MG PO TABS
5.0000 mg | ORAL_TABLET | Freq: Once | ORAL | Status: AC
  Administered 2015-01-13: 5 mg via ORAL
  Filled 2015-01-13: qty 1

## 2015-01-13 NOTE — ED Provider Notes (Signed)
CSN: [id number]     Arrival date & time 01/13/15  1536 History   First MD Initiated Contact with Patient 01/13/15 1700     Chief Complaint  Patient presents with  . Fatigue  . Shortness of Breath  . Emesis     (Consider location/radiation/quality/duration/timing/severity/associated sxs/prior Treatment) HPI  54 year old female with a history of CHF presents with 2 weeks of progressive dyspnea, fatigue, and leg swelling. Previously her right leg was swollen but now seems like her left leg is more swollen. Her right leg has improved. 2 days ago she had a negative DVT ultrasound for both lower extremities. Patient states her symptoms feel like worsening of her CHF. She has been increasing her torsemide per cardiology's recommendations. Called cardiology and Dr. Jones BroomBensihmon who is planning to admit through the ED.  Past Medical History  Diagnosis Date  . Chronic systolic heart failure (HCC)     a.  secondary to severe ischemic cardiomyopathy with EF 20-25%;  b. s/p rimplantation of Biotronik single chamber ICD by Dr Ladona Ridgelaylor 02/2008;  c.  RHC 07/2013 showed well compensated left sided pressures with moderate PAH and low cardiac output. d. 2D Echo 03/2014: EF 20-25%, grade 2 DD, PAP 58mmHg.  [name]. CAD (coronary artery disease)     a. 1996: anterior wall MI tx with PTCA. b. 2003: PCI/DES to circumflex marginal. c. 2004: PTCA/CBA/stenting of ostium of D1 and LAD. d. 06/2003: s/p PTCA/DES of mRCA. e. 11/2003: CBA of bifurcation diagonal LAD. f. 2007: PTCA/DES to RCA, CBA/stenting to OM2.  . Tobacco abuse   . Alcohol abuse   . History of noncompliance with medical treatment   . Diabetes mellitus, type 2 (HCC)   . Dizziness   . HLD (hyperlipidemia)   . DVT (deep venous thrombosis) (HCC)     a. 06/2010.  . Arterial embolism (HCC)     a. H/o RLE extremity ischemia in 2012 s/p right iliofemoral embolectomy with compartment fasciotomy.  . V-tach (HCC)   . AICD (automatic cardioverter/defibrillator) present    a. Biotronik placed 2009. b. ICD discharge 2011, 03/2013.  [name]. Asthma   . Sleep apnea   . GERD (gastroesophageal reflux disease)   . Anxiety   . Myocardial infarction (HCC)   . COPD (chronic obstructive pulmonary disease) (HCC)   . LV (left ventricular) mural thrombus (HCC)     a. Dx on echo 03/2014.   Past Surgical History  Procedure Laterality Date  . Diagnostic laparoscopy    . Cardiac catheterization    . Insert / replace / remove pacemaker    . Tubal ligation    . Right heart catheterization N/A 07/23/2013    Procedure: RIGHT HEART CATH;  Surgeon: Dolores Pattyaniel R Bensimhon, MD;  [facility];  Service: Cardiovascular;  Laterality: N/A;   Family History  Problem Relation Age of Onset  . Coronary artery disease      FMILY HISTORY   Social History  Substance Use Topics  . Smoking status: Current Some Day Smoker -- 0.25 packs/day for 21 years    Types: Cigars  . Smokeless tobacco: Never Used     Comment: Smokes 2-3 cigars daily, quit smoking cigarettes 3 yrs ago.   . Alcohol Use: No   OB History    No data available     Review of Systems  Constitutional: Negative for fever.  Respiratory: Positive for shortness of breath.   Cardiovascular: Positive for chest pain (intermittently) and leg swelling.  Gastrointestinal: Positive for abdominal distention.  Negative for abdominal pain.  All other systems reviewed and are negative.     Allergies  Nyquil multi-symptom; Sudafed; Zocor; Latex; and Ramipril  Home Medications   Prior to Admission medications   Medication Sig Start Date End Date Taking? Authorizing Provider  albuterol (PROVENTIL HFA;VENTOLIN HFA) 108 (90 BASE) MCG/ACT inhaler Inhale 2 puffs into the lungs every 6 (six) hours as needed for wheezing or shortness of breath. 10/26/13  Yes Lupita Leash, MD  albuterol (PROVENTIL) (2.5 MG/3ML) 0.083% nebulizer solution Take 2.5 mg by nebulization every 6 (six) hours as needed for wheezing or shortness of breath.    Yes Historical Provider, MD  aspirin-acetaminophen-caffeine (EXCEDRIN MIGRAINE) [phone] MG tablet Take 1 tablet by mouth every 6 (six) hours as needed for headache.   Yes Historical Provider, MD  digoxin (LANOXIN) 0.25 MG tablet TAKE 1 TABLET (0.25 MG TOTAL) BY MOUTH DAILY. Patient taking differently: TAKE 1 TABLET (0.25 MG TOTAL) BY MOUTH DAILY AT BEDTIME 08/03/14  Yes Bevelyn Buckles Bensimhon, MD  magnesium oxide (MAG-OX) 400 (241.3 MG) MG tablet TAKE 1/2 TABLET ( ) EVERY DAY Patient taking differently: TAKE 1/2 TABLET ( ) EVERY DAILY AT BEDTIME 09/15/14  Yes Dolores Patty, MD  metolazone (ZAROXOLYN) 2.5 MG tablet Take 2.5 on Wednesday and Saturday only. Take a extra Potassium 20 with this dose. 01/11/15  Yes Laurey Morale, MD  potassium chloride SA (K-DUR,KLOR-CON) 20 MEQ tablet Take 40 meq in the am and 20 meq in the evening Patient taking differently: Take 20-40 mEq by mouth 2 (two) times daily. Take 40 meq in the am and 20 meq in the evening 01/11/15  Yes Laurey Morale, MD  spironolactone (ALDACTONE) 25 MG tablet TAKE 1 TABLET (25 MG TOTAL) BY MOUTH DAILY. 09/15/14  Yes Amy D Clegg, NP  torsemide (DEMADEX) 20 MG tablet Take 3 tablets (60 mg total) by mouth 2 (two) times daily. 01/11/15  Yes Laurey Morale, MD  warfarin (COUMADIN) 5 MG tablet Take 1 tablet (5 mg total) by mouth as directed. Take As Directed by [facility] Patient taking differently: Take 2.5 mg by mouth daily at 6 PM. Take As Directed by [facility] 06/02/14  Yes Dolores Patty, MD  pravastatin (PRAVACHOL) 40 MG tablet Take 1 tablet (40 mg total) by mouth every evening. 01/11/15   Laurey Morale, MD  sildenafil (REVATIO) 20 MG tablet Take 1 tablet (20 mg total) by mouth 3 (three) times daily. 01/11/15   Laurey Morale, MD   BP 103/76 mmHg  Pulse 88  Temp(Src) 98.3 F (36.8 C) (Oral)  Resp 24  Wt 133 lb 6 oz (60.499 kg)  SpO2 95% Physical Exam  Constitutional: She is oriented to person, place, and time.  She appears well-developed and well-nourished.  HENT:  Head: Normocephalic and atraumatic.  Right Ear: External ear normal.  Left Ear: External ear normal.  Nose: Nose normal.  Eyes: Right eye exhibits no discharge. Left eye exhibits no discharge.  Cardiovascular: Normal rate, regular rhythm and normal heart sounds.   Pulmonary/Chest: Effort normal and breath sounds normal.  Abdominal: Soft. There is no tenderness.  Musculoskeletal: She exhibits edema (trace lower extremity edema, L>R).  Neurological: She is alert and oriented to person, place, and time.  Skin: Skin is warm and dry.  Nursing note and vitals reviewed.   ED Course  Procedures (including critical care time) Labs Review Labs Reviewed  BASIC METABOLIC PANEL - Abnormal; Notable for the following:    Sodium 131 (*)  Chloride 92 (*)    Glucose, Bld 283 (*)    BUN 22 (*)    Creatinine, Ser 1.55 (*)    GFR calc non Af Amer 36 (*)    GFR calc Af Amer 42 (*)    All other components within normal limits  CBC - Abnormal; Notable for the following:    RDW 15.7 (*)    All other components within normal limits  BRAIN NATRIURETIC PEPTIDE  I-STAT TROPOININ, ED    Imaging Review Dg Chest 2 View  01/13/2015  CLINICAL DATA:  Chest pain short of breath 2 weeks EXAM: CHEST  2 VIEW COMPARISON:  Radiograph 05/05/2014 FINDINGS: LEFT-sided pacemaker with single continuously overlies stable enlarged cardiac silhouette. No effusion, infiltrate, or pneumothorax. Mild central venous congestion is present. No acute osseous abnormality. IMPRESSION: Cardiomegaly and central venous congestion not changed from prior. Electronically Signed   By: Genevive Bi M.D.   On: 01/13/2015 17:02   I have personally reviewed and evaluated these images and lab results as part of my medical decision-making.   EKG Interpretation   Date/Time:  Thursday January 13 2015 15:38:17 EDT Ventricular Rate:  93 PR Interval:  154 QRS Duration: 100 QT  Interval:  350 QTC Calculation: 435 R Axis:   146 Text Interpretation:  Sinus rhythm with frequent Premature ventricular  complexes Biatrial enlargement Possible Lateral infarct , age undetermined  Possible Inferior infarct , age undetermined Abnormal ECG no significant  change since 2 days ago Confirmed by Sacramento Monds  MD, Kinsler Soeder ([id number]) on  01/13/2015 5:07:51 PM      MDM   Final diagnoses:  Acute on chronic systolic heart failure (HCC)    Patient with 2 weeks of gradually worsening systolic heart failure symptoms. Has been trying outpatient torsemide with no significant relief. Cardiology is currently down in the ER planning to admit for further inpatient care.    Pricilla Loveless, MD 01/13/15 [phone]

## 2015-01-13 NOTE — ED Notes (Signed)
Patient reports she has 2 week history of not feeling well. Reports intermittent chest pain, sob, vomiting, and leg pain with swelling.

## 2015-01-13 NOTE — ED Notes (Signed)
Pt.l stated, I just don't feel good.  Im just SOB. Pt. Denies any chest pain at the present.

## 2015-01-13 NOTE — H&P (Signed)
Patient ID: Jessica [name] MRN: [id number], DOB/AGE: 1960/10/27   Admit date: 01/13/2015   Primary Physician: No PCP Per Patient Primary Cardiologist: Dr. Gala Romney  Pt. Profile:  54 y/o female with history of ICM, chronic systolic heart failure EF 25%, CAD s/p multiple interventions,  ICD (Biotronic), DM, hyperlipidemia, smoker, S/P right iliofemoral embolectomy and compartment fasciotomy of the right leg by Dr. Darrick Penna with LLE DVT 06/2010. Also with h/o LV thrombus, on chronic coumadin, presenting with acute on chronic systolic CHF.   Problem List  Past Medical History  Diagnosis Date  . Chronic systolic heart failure (HCC)     a.  secondary to severe ischemic cardiomyopathy with EF 20-25%;  b. s/p rimplantation of Biotronik single chamber ICD by Dr Ladona Ridgel 02/2008;  c.  RHC 07/2013 showed well compensated left sided pressures with moderate PAH and low cardiac output. d. 2D Echo 03/2014: EF 20-25%, grade 2 DD, PAP .  [name] CAD (coronary artery disease)     a. 1996: anterior wall MI tx with PTCA. b. 2003: PCI/DES to circumflex marginal. c. 2004: PTCA/CBA/stenting of ostium of D1 and LAD. d. 06/2003: s/p PTCA/DES of mRCA. e. 11/2003: CBA of bifurcation diagonal LAD. f. 2007: PTCA/DES to RCA, CBA/stenting to OM2.  . Tobacco abuse   . Alcohol abuse   . History of noncompliance with medical treatment   . Diabetes mellitus, type 2 (HCC)   . Dizziness   . HLD (hyperlipidemia)   . DVT (deep venous thrombosis) (HCC)     a. 06/2010.  . Arterial embolism (HCC)     a. H/o RLE extremity ischemia in 2012 s/p right iliofemoral embolectomy with compartment fasciotomy.  . V-tach (HCC)   . AICD (automatic cardioverter/defibrillator) present     a. Biotronik placed 2009. b. ICD discharge 2011, 03/2013.  [name] Asthma   . Sleep apnea   . GERD (gastroesophageal reflux disease)   . Anxiety   . Myocardial infarction (HCC)   . COPD (chronic obstructive pulmonary disease) (HCC)   . LV (left ventricular)  mural thrombus (HCC)     a. Dx on echo 03/2014.    Past Surgical History  Procedure Laterality Date  . Diagnostic laparoscopy    . Cardiac catheterization    . Insert / replace / remove pacemaker    . Tubal ligation    . Right heart catheterization N/A 07/23/2013    Procedure: RIGHT HEART CATH;  Surgeon: Dolores Patty, MD;  Location: [facility] CATH LAB;  Service: Cardiovascular;  Laterality: N/A;     Allergies  Allergies  Allergen Reactions  . Nyquil Multi-Symptom [Pseudoeph-Doxylamine-Dm-Apap] Other (See Comments)    Fatigue, chest pressure and pain  . Sudafed [Pseudoephedrine] Other (See Comments)    Fatigue, chest pressure and pain  . Zocor [Simvastatin]     Myalgias   . Latex Rash  . Ramipril Cough    HPI  54 y.o. Female, followed by Dr. Gala Romney, with a history of ICM, chronic systolic heart failure EF 25%, CAD s/p multiple interventions,  ICD (Biotronic), DM, hyperlipidemia, smoker, S/P right iliofemoral embolectomy and compartment fasciotomy of the right leg by Dr. Darrick Penna with LLE DVT 06/2010. Also with h/o LV thrombus, on chronic coumadin.   She has seen Dr. Kendrick Fries as part of her [facility] workup. PFTs were done in 7/15 suggesting an interstitial process (restrictive) with markedly decreased DLCO): FVC 74%%, FEV1 81%, ratio 107%, TLC 76%, DLCO 29%. CTA chest in 4/15 showed no PE but did have focal honeycomb  changes in the lung bases.   She was recently seen in the [facility] clinic 2 days ago on 01/11/15 by Dr. Shirlee Latch. Her weight was up 6 lb compared to prior visit. OV weight was 140 lb. She had also endorsed increased bilateral LEE and progressive dyspnea over a 3 week period. There was evidence of volume overload on physical exam. There was also concern for possible DVT however LE venous dopplers were negative. She was instructed to adjust her diuretic regimen with increase in torsemide to 60 mg BID and metolazone increased to Wednesday and Saturday with extra 20 meq of  potassium on metolazone days.Revatio was increased to TID. BMP showed SCr of 1.27 and K of 3.9. Na was low at 134. She was instructed to f/u in 1 week.  She presents now to the [facility] ED with worsening symptoms and increased weight gain, despite medical compliance. She continues to smoke.  She also notes a superficial skin infection on her anterior forehead c/w impetigo. Also with similar lesions on her back which have been a chronic condition.   CXR shows cardiomegaly and central venous congestion not changed from prior. SCr has increased to 1.55.     Home Medications  Prior to Admission medications   Medication Sig Start Date End Date Taking? Authorizing Provider  albuterol (PROVENTIL HFA;VENTOLIN HFA) 108 (90 BASE) MCG/ACT inhaler Inhale 2 puffs into the lungs every 6 (six) hours as needed for wheezing or shortness of breath. 10/26/13   Lupita Leash, MD  digoxin (LANOXIN) 0.25 MG tablet TAKE 1 TABLET (0.25 MG TOTAL) BY MOUTH DAILY. 08/03/14   Dolores Patty, MD  magnesium oxide (MAG-OX) 400 (241.3 MG) MG tablet TAKE 1/2 TABLET (200MG ) EVERY DAY 09/15/14   Dolores Patty, MD  metolazone (ZAROXOLYN) 2.5 MG tablet Take 2.5 on Wednesday and Saturday only. Take a extra Potassium 20 with this dose. 01/11/15   Laurey Morale, MD  potassium chloride SA (K-DUR,KLOR-CON) 20 MEQ tablet Take 40 meq in the am and 20 meq in the evening 01/11/15   Laurey Morale, MD  pravastatin (PRAVACHOL) 40 MG tablet Take 1 tablet (40 mg total) by mouth every evening. 01/11/15   Laurey Morale, MD  sildenafil (REVATIO) 20 MG tablet Take 1 tablet (20 mg total) by mouth 3 (three) times daily. 01/11/15   Laurey Morale, MD  spironolactone (ALDACTONE) 25 MG tablet TAKE 1 TABLET (25 MG TOTAL) BY MOUTH DAILY. 09/15/14   Amy D Filbert Schilder, NP  torsemide (DEMADEX) 20 MG tablet Take 3 tablets (60 mg total) by mouth 2 (two) times daily. 01/11/15   Laurey Morale, MD  warfarin (COUMADIN) 5 MG tablet Take 1 tablet (5 mg total) by  mouth as directed. Take As Directed by Coumadin Clinic 06/02/14   Dolores Patty, MD    Family History  Family History  Problem Relation Age of Onset  . Coronary artery disease      FMILY HISTORY    Social History  Social History   Social History  . Marital Status: Divorced    Spouse Name: N/A  . Number of Children: N/A  . Years of Education: N/A   Occupational History  . disabled    Social History Main Topics  . Smoking status: Current Some Day Smoker -- 0.25 packs/day for 21 years    Types: Cigars  . Smokeless tobacco: Never Used     Comment: Smokes 2-3 cigars daily, quit smoking cigarettes 3 yrs ago.   [name]  Alcohol Use: No  . Drug Use: No  . Sexual Activity: No   Other Topics Concern  . Not on file   Social History Narrative     Review of Systems General:  No chills, fever or  night sweats, + weight changes (weight gain).  Cardiovascular:  No chest pain, dyspnea on exertion, +edema, orthopnea, palpitations, paroxysmal nocturnal dyspnea. Dermatological: No rash, + lesion (anterior forehead and back) Respiratory: No cough, + dyspnea Urologic: No hematuria, dysuria Abdominal:   No nausea, vomiting, diarrhea, bright red blood per rectum, melena, or hematemesis Neurologic:  No visual changes, wkns, changes in mental status. All other systems reviewed and are otherwise negative except as noted above.  Physical Exam  Blood pressure 103/76, pulse 88, temperature 98.3 F (36.8 C), temperature source Oral, resp. rate 24, weight 133 lb 6 oz (60.499 kg), SpO2 95 %.  General: Pleasant, NAD Psych: Normal affect. Neuro: Alert and oriented X 3. Moves all extremities spontaneously. HEENT: Normal  Neck: Supple without bruits, elevated JVD at the level of the jaw. Lungs:  Resp regular and unlabored, CTA. Heart: RRR no +s3, s4, or murmurs. PMI laterally displaced. Abdomen: Soft, non-tender, distended, BS + x 4.  Extremities: No clubbing, cyanosis or edema. DP/PT/Radials 2+  and equal bilaterally. Skin: multiple open back lesions, also with lesion on anterior forehead  Labs  Troponin (Point of Care Test)  Recent Labs  01/13/15 1608  TROPIPOC 0.03   No results for input(s): CKTOTAL, CKMB, TROPONINI in the last 72 hours. Lab Results  Component Value Date   WBC 4.9 01/13/2015   HGB 13.2 01/13/2015   HCT 39.1 01/13/2015   MCV 83.0 01/13/2015   PLT 172 01/13/2015     Recent Labs Lab 01/13/15 1604  NA 131*  K 4.0  CL 92*  CO2 29  BUN 22*  CREATININE 1.55*  CALCIUM 9.1  GLUCOSE 283*   Lab Results  Component Value Date   CHOL 108 02/21/2011   HDL 20* 02/21/2011   LDLCALC 76 02/21/2011   TRIG 62 02/21/2011   No results found for: DDIMER   Radiology/Studies  No results found.  ECG  SR w/ PVCs 93 bpm    ASSESSMENT AND PLAN  Active Problems:   SYSTOLIC HEART FAILURE, ACUTE ON CHRONIC   ICD (implantable cardioverter-defibrillator) in place   Pulmonary HTN (HCC)   Ischemic cardiomyopathy  1. Acute on Chronic Systolic CHF: symptomatic with evidence of volume overload on exam with distended abdomen and elevated JVD. Will admit for IV diuretic therapy. Will order 80 mg IV Lasix BID along with supplemental K. Will monitor renal function and electrolytes closely. Daily weights, strict I/Os and low sodium diet. Per clinic notes, she has been unable to tolerate BB and ACE therapy 2/2 hypotension.   2. CAD: stable w/o  chest pain.  3. Ischemic Cardiomyopathy: EF 25%. ICD in place.   4. Skin Infection: appearance on face concerning for impetigo but also with multiple lesions on her back. Will treat with doxycycline.   5. Acute on chronic renal insufficiency: SCr has risen since 11/1 from 1.27 to 1.55. Will continue to monitor closely while diuresing with lasix.    Beau Fanny, PA-C 01/13/2015, 5:04 PM   Patient seen and examined with Robbie Lis, PA-C. We discussed all aspects of the encounter. I agree with the  assessment and plan as stated above.   Volume status elevated and not responding well to IV diuretics. Will admit for IV lasix. Watch renal  function closely. BP has been unable to tolerate ACE or bblocker. Treat skin infection with doxy. Discussed need to stop smoking.   Cyla Haluska,MD 6:45 PM

## 2015-01-13 NOTE — Telephone Encounter (Signed)
Called pt w/LE u/s results, she is aware.  She reports she has increased her Torsemide and did take the metolazone yesterday but she is still full of fluid and doesn't feel like it has helped at all.  She states she feels like she needs to be in the hospital.  Discussed w/Dr Bensimhon, he states pt can come to ER for eval and admit, pt aware and agreeable

## 2015-01-13 NOTE — ED Notes (Signed)
Attempted to call report. Floor RN unable to accept report.  

## 2015-01-13 NOTE — ED Notes (Signed)
Pt. assisted to BR. Yellow slippers applied.

## 2015-01-13 NOTE — Progress Notes (Signed)
ANTICOAGULATION CONSULT NOTE - Initial Consult  Pharmacy Consult for warfarin Indication: history of LV thrombus and DVT  Allergies  Allergen Reactions  . Nyquil Multi-Symptom [Pseudoeph-Doxylamine-Dm-Apap] Other (See Comments)    Fatigue, chest pressure and pain  . Sudafed [Pseudoephedrine] Other (See Comments)    Fatigue, chest pressure and pain  . Zocor [Simvastatin]     Myalgias   . Latex Rash  . Ramipril Cough  Patient Measurements: Height: 5\' 5"  (165.1 cm) Weight: 136 lb 8 oz (61.916 kg) IBW/kg (Calculated) : 57 Vital Signs: Temp: 99.7 F (37.6 C) (11/03 1922) Temp Source: Oral (11/03 1922) BP: 109/64 mmHg (11/03 1922) Pulse Rate: 45 (11/03 1922) Labs:  Recent Labs  01/11/15 1204 01/13/15 1604 01/13/15 1819  HGB 13.1 13.2  --   HCT 40.2 39.1  --   PLT 157 172  --   LABPROT 16.7*  --  16.0*  INR 1.34  --  1.27  CREATININE 1.27* 1.55*  --    Estimated Creatinine Clearance: 36.5 mL/min (by C-G formula based on Cr of 1.55).  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   Patient states that she has NOT been to her follow-up anticoagulation visits in months and was taking 1/2 tablet (5mg  tablets) without guidance of INR. Her INR on admission is SUB-therapeutic at 1.27. Her last dose was on 01/11/15. Her CBC is within normal limits.   Note interacting medication - Doxycycline  Goal of Therapy:  INR 2-3 Monitor platelets by anticoagulation protocol: Yes   Plan:  Warfarin 5mg  po x1 tonight Daily PT/INR *MD- Patient needs follow-up as outpatient if to continue on this therapy.   Link SnufferJessica Fran Neiswonger, PharmD, BCPS Clinical Pharmacist [phone] 01/13/2015,7:58 PM

## 2015-01-14 DIAGNOSIS — I5023 Acute on chronic systolic (congestive) heart failure: Secondary | ICD-10-CM | POA: Diagnosis not present

## 2015-01-14 LAB — BASIC METABOLIC PANEL
ANION GAP: 13 (ref 5–15)
BUN: 26 mg/dL — ABNORMAL HIGH (ref 6–20)
CALCIUM: 9 mg/dL (ref 8.9–10.3)
CO2: 29 mmol/L (ref 22–32)
Chloride: 93 mmol/L — ABNORMAL LOW (ref 101–111)
Creatinine, Ser: 1.61 mg/dL — ABNORMAL HIGH (ref 0.44–1.00)
GFR calc non Af Amer: 35 mL/min — ABNORMAL LOW (ref >60)
GFR, EST AFRICAN AMERICAN: 40 mL/min — AB (ref >60)
Glucose, Bld: 90 mg/dL (ref 65–99)
POTASSIUM: 4 mmol/L (ref 3.5–5.1)
Sodium: 135 mmol/L (ref 135–145)

## 2015-01-14 LAB — GLUCOSE, CAPILLARY
GLUCOSE-CAPILLARY: 152 mg/dL — AB (ref 65–99)
GLUCOSE-CAPILLARY: 251 mg/dL — AB (ref 65–99)
GLUCOSE-CAPILLARY: 290 mg/dL — AB (ref 65–99)
Glucose-Capillary: 116 mg/dL — ABNORMAL HIGH (ref 65–99)
Glucose-Capillary: 122 mg/dL — ABNORMAL HIGH (ref 65–99)
Glucose-Capillary: 242 mg/dL — ABNORMAL HIGH (ref 65–99)

## 2015-01-14 LAB — PROTIME-INR
INR: 1.25 (ref 0.00–1.49)
Prothrombin Time: 15.9 seconds — ABNORMAL HIGH (ref 11.6–15.2)

## 2015-01-14 LAB — MAGNESIUM: MAGNESIUM: 1.4 mg/dL — AB (ref 1.7–2.4)

## 2015-01-14 MED ORDER — MAGNESIUM SULFATE 2 GM/50ML IV SOLN
2.0000 g | Freq: Once | INTRAVENOUS | Status: AC
  Administered 2015-01-14: 2 g via INTRAVENOUS
  Filled 2015-01-14: qty 50

## 2015-01-14 MED ORDER — WARFARIN SODIUM 5 MG PO TABS
5.0000 mg | ORAL_TABLET | Freq: Once | ORAL | Status: AC
  Administered 2015-01-14: 5 mg via ORAL
  Filled 2015-01-14: qty 1

## 2015-01-14 NOTE — Care Management Note (Signed)
Case Management Note  Patient Details  Name: Jennye MoccasinSherita Knight MRN: [id number] Date of Birth: 08/30/1960  Subjective/Objective:      Admitted with CHF              Action/Plan: Patient lives at home with her boyfriend, on Disability and Social Security. Has [facility] with prescription drug coverage - no problems getting her medication, pharmacy of choice is [facility]. He has a walker at home and scales. Patient states that she weighs herself daily. Patient could benefit from a Disease Management Program for CHF but patient refused at this time. CM will continue to follow for DCP.  Expected Discharge Date:     Possibly 01/18/2015             Expected Discharge Plan:  Home/Self Care  Discharge planning Services  CM Consult  Status of Service:  In process, will continue to follow  Reola MosherChandler, Tamaya Pun L, RN,MHA,BSN [phone] 01/14/2015, 11:28 AM

## 2015-01-14 NOTE — Progress Notes (Signed)
Inpatient Diabetes Program Recommendations  AACE/ADA: New Consensus Statement on Inpatient Glycemic Control (2015)  Target Ranges:  Prepandial:   less than 140 mg/dL      Peak postprandial:   less than 180 mg/dL (1-2 hours)      Critically ill patients:  140 - 180 mg/dL   Review of Glycemic Control  Diabetes history: DM 2  Outpatient Diabetes medications: None listed Current orders for Inpatient glycemic control: sensitive correction q 4 hrs  Inpatient Diabetes Program Recommendations: Please check HgbA1C. Glucose elevated to 251 mg/dL at noon today-not due to needing additional insulin but due to the pt did not get her correction this morning.     Thank you Lenor CoffinAnn Dacy Enrico, RN, MSN, CDE  Diabetes Inpatient Program Office: [phone] Pager: [phone] 8:00 am to 5:00 pm

## 2015-01-14 NOTE — Progress Notes (Signed)
ANTICOAGULATION CONSULT NOTE - Initial Consult  Pharmacy Consult for warfarin Indication: history of LV thrombus and DVT  Allergies  Allergen Reactions  . Nyquil Multi-Symptom [Pseudoeph-Doxylamine-Dm-Apap] Other (See Comments)    Fatigue, chest pressure and pain  . Sudafed [Pseudoephedrine] Other (See Comments)    Fatigue, chest pressure and pain  . Zocor [Simvastatin]     Myalgias   . Latex Rash  . Ramipril Cough  Patient Measurements: Height: 5\' 5"  (165.1 cm) Weight: 134 lb 15 oz (61.208 kg) (scale C) IBW/kg (Calculated) : 57 Vital Signs: Temp: 98.1 F (36.7 C) (11/04 1111) Temp Source: Oral (11/04 1111) BP: 109/68 mmHg (11/04 1111) Pulse Rate: 74 (11/04 1111) Labs:  Recent Labs  01/13/15 1604 01/13/15 1819 01/14/15 0256  HGB 13.2  --   --   HCT 39.1  --   --   PLT 172  --   --   LABPROT  --  16.0* 15.9*  INR  --  1.27 1.25  CREATININE 1.55*  --  1.61*   Estimated Creatinine Clearance: 35.1 mL/min (by C-G formula based on Cr of 1.61).  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 On coumadin 2.5mg  pta with subtherapeutic INR upon admission.   INR today 1.25. CBC ok, no bleeding noted.   Note interacting medication - Doxycycline  Goal of Therapy:  INR 2-3 Monitor platelets by anticoagulation protocol: Yes   Plan:  Warfarin 5mg  po x1 tonight Daily INR/CBC *MD- Patient needs follow-up as outpatient if to continue on this therapy.   Glennie Rodda C. Marvis MoellerMiles, PharmD Pharmacy Resident  Pager: [phone] 01/14/2015 12:20 PM

## 2015-01-14 NOTE — Clinical Documentation Improvement (Signed)
Cardiology  Abnormal Lab/Test Results:  01/13/15: Na= 131  Possible Clinical Conditions associated with below indicators  Hyponatremia  Other Condition  Cannot Clinically Determine   Treatment Provided: IVF's   Please exercise your independent, professional judgment when responding. A specific answer is not anticipated or expected.   Thank You,  Cherylann Ratelonna J Philopater Mucha, RN, BSN Health Information Management [place] [phone]

## 2015-01-14 NOTE — Progress Notes (Signed)
Advanced Heart Failure Rounding Note   Subjective:    Continues to feel weak. Edema improved. Weight down 2 pounds.   Cr 1.55 -> 1.61    Objective:   Weight Range:  Vital Signs:   Temp:  [98 F (36.7 C)-99.7 F (37.6 C)] 98.8 F (37.1 C) (11/04 1607) Pulse Rate:  [43-88] 73 (11/04 1607) Resp:  [16-24] 18 (11/04 1607) BP: (93-123)/(58-83) 93/64 mmHg (11/04 1607) SpO2:  [94 %-100 %] 97 % (11/04 1607) Weight:  [61.208 kg (134 lb 15 oz)-61.916 kg (136 lb 8 oz)] 61.208 kg (134 lb 15 oz) (11/04 0446) Last BM Date: 01/13/15  Weight change: Filed Weights   01/13/15 1553 01/13/15 1922 01/14/15 0446  Weight: 60.499 kg (133 lb 6 oz) 61.916 kg (136 lb 8 oz) 61.208 kg (134 lb 15 oz)    Intake/Output:   Intake/Output Summary (Last 24 hours) at 01/14/15 1624 Last data filed at 01/14/15 1449  Gross per 24 hour  Intake    463 ml  Output   3750 ml  Net  -3287 ml     Physical exam: General: Pleasant, NAD Psych: Normal affect. Neuro: Alert and oriented X 3. Moves all extremities spontaneously. HEENT: Normal Neck: Supple without bruits,JVP 9 Lungs: Resp regular and unlabored, CTA. Heart: RRR no +s3, s4, or murmurs. PMI laterally displaced. Abdomen: Soft, non-tender, distended, BS + x 4.  Extremities: No clubbing, cyanosis or edema. DP/PT/Radials 2+ and equal bilaterally. Skin: multiple open back lesions, also with lesion on anterior forehead  Telemetry: NSR  Labs: Basic Metabolic Panel:  Recent Labs Lab 01/11/15 1204 01/13/15 1604 01/14/15 0256 01/14/15 1316  NA 134* 131* 135  --   K 3.9 4.0 4.0  --   CL 94* 92* 93*  --   CO2 --   GLUCOSE 201* 283* 90  --   BUN 21* 22* 26*  --   CREATININE 1.27* 1.55* 1.61*  --   CALCIUM 9.4 9.1 9.0  --   MG  --   --   --  1.4*    Liver Function Tests: No results for input(s): AST, ALT, ALKPHOS, BILITOT, PROT, ALBUMIN in the last 168 hours. No results for input(s): LIPASE, AMYLASE in the last 168  hours. No results for input(s): AMMONIA in the last 168 hours.  CBC:  Recent Labs Lab 01/11/15 1204 01/13/15 1604  WBC 4.8 4.9  HGB 13.1 13.2  HCT 40.2 39.1  MCV 84.1 83.0  PLT 157 172    Cardiac Enzymes: No results for input(s): CKTOTAL, CKMB, CKMBINDEX, TROPONINI in the last 168 hours.  BNP: BNP (last 3 results)  Recent Labs  05/05/14 1100 01/13/15 1712  BNP 472.8* 787.6*    ProBNP (last 3 results) No results for input(s): PROBNP in the last 8760 hours.    Other results:  Imaging: Dg Chest 2 View  01/13/2015  CLINICAL DATA:  Chest pain short of breath 2 weeks EXAM: CHEST  2 VIEW COMPARISON:  Radiograph 05/05/2014 FINDINGS: LEFT-sided pacemaker with single continuously overlies stable enlarged cardiac silhouette. No effusion, infiltrate, or pneumothorax. Mild central venous congestion is present. No acute osseous abnormality. IMPRESSION: Cardiomegaly and central venous congestion not changed from prior. Electronically Signed   By: Genevive Bi M.D.   On: 01/13/2015 17:02      Medications:     Scheduled Medications: . digoxin  0.25 mg Oral Daily  . doxycycline  100 mg Oral Q12H  . furosemide  80 mg  Intravenous BID  . hydrocortisone cream   Topical TID  . insulin aspart  0-9 Units Subcutaneous 6 times per day  . magnesium sulfate 1 - 4 g bolus IVPB  2 g Intravenous Once  . potassium chloride  40 mEq Oral Daily  . pravastatin  40 mg Oral QPM  . sildenafil  20 mg Oral TID  . sodium chloride  3 mL Intravenous Q12H  . spironolactone  25 mg Oral Daily  . warfarin  5 mg Oral ONCE-1800  . Warfarin - Pharmacist Dosing Inpatient   Does not apply q1800     Infusions:     PRN Medications:  sodium chloride, acetaminophen, albuterol, ondansetron (ZOFRAN) IV, sodium chloride   Assessment:   1. Acute on chronic systolic HF 2. CAD 3. Ischemic Cardiomyopathy: EF 25%. ICD in place.  4. Skin Infection: appearance on face concerning for impetigo but also  with multiple lesions on her back. Will treat with doxycycline.  5. Acute on chronic renal insufficiency:  Plan/Discussion:    Volume status improved but still very weak. I worry about low output. We discussed possibility about placing central access and measuring CVPs and co-ox but she would like to hold off currently. Will reassess tomorrow.    Length of Stay: 1   Bensimhon, Daniel MD 01/14/2015, 4:24 PM  Advanced Heart Failure Team Pager [phone] (M-F; 7a - 4p)  Please contact [facility] for night-coverage after hours (4p -7a ) and weekends on [email or web address]

## 2015-01-15 ENCOUNTER — Encounter (HOSPITAL_COMMUNITY): Payer: Self-pay | Admitting: Physician Assistant

## 2015-01-15 DIAGNOSIS — E1165 Type 2 diabetes mellitus with hyperglycemia: Secondary | ICD-10-CM

## 2015-01-15 DIAGNOSIS — N179 Acute kidney failure, unspecified: Secondary | ICD-10-CM

## 2015-01-15 DIAGNOSIS — L0100 Impetigo, unspecified: Secondary | ICD-10-CM

## 2015-01-15 DIAGNOSIS — I2510 Atherosclerotic heart disease of native coronary artery without angina pectoris: Secondary | ICD-10-CM

## 2015-01-15 DIAGNOSIS — IMO0002 Reserved for concepts with insufficient information to code with codable children: Secondary | ICD-10-CM

## 2015-01-15 DIAGNOSIS — E871 Hypo-osmolality and hyponatremia: Secondary | ICD-10-CM

## 2015-01-15 LAB — HEMOGLOBIN A1C
Hgb A1c MFr Bld: 9.8 % — ABNORMAL HIGH (ref 4.8–5.6)
Mean Plasma Glucose: 235 mg/dL

## 2015-01-15 LAB — MAGNESIUM: MAGNESIUM: 2.6 mg/dL — AB (ref 1.7–2.4)

## 2015-01-15 LAB — PROTIME-INR
INR: 1.39 (ref 0.00–1.49)
Prothrombin Time: 17.1 s — ABNORMAL HIGH (ref 11.6–15.2)

## 2015-01-15 LAB — BASIC METABOLIC PANEL WITH GFR
Anion gap: 10 (ref 5–15)
BUN: 30 mg/dL — ABNORMAL HIGH (ref 6–20)
CO2: 30 mmol/L (ref 22–32)
Calcium: 9.1 mg/dL (ref 8.9–10.3)
Chloride: 93 mmol/L — ABNORMAL LOW (ref 101–111)
Creatinine, Ser: 1.39 mg/dL — ABNORMAL HIGH (ref 0.44–1.00)
GFR calc Af Amer: 48 mL/min — ABNORMAL LOW
GFR calc non Af Amer: 41 mL/min — ABNORMAL LOW
Glucose, Bld: 180 mg/dL — ABNORMAL HIGH (ref 65–99)
Potassium: 4 mmol/L (ref 3.5–5.1)
Sodium: 133 mmol/L — ABNORMAL LOW (ref 135–145)

## 2015-01-15 LAB — GLUCOSE, CAPILLARY
GLUCOSE-CAPILLARY: 191 mg/dL — AB (ref 65–99)
GLUCOSE-CAPILLARY: 172 mg/dL — AB (ref 65–99)
Glucose-Capillary: 172 mg/dL — ABNORMAL HIGH (ref 65–99)
Glucose-Capillary: 183 mg/dL — ABNORMAL HIGH (ref 65–99)

## 2015-01-15 MED ORDER — TORSEMIDE 20 MG PO TABS: 40.0000 mg | ORAL_TABLET | Freq: Two times a day (BID) | ORAL | Status: DC

## 2015-01-15 MED ORDER — METOLAZONE 2.5 MG PO TABS: ORAL_TABLET | ORAL | Status: DC

## 2015-01-15 MED ORDER — DOXYCYCLINE HYCLATE 100 MG PO TABS: 100.0000 mg | ORAL_TABLET | Freq: Two times a day (BID) | ORAL | Status: DC

## 2015-01-15 MED ORDER — POTASSIUM CHLORIDE CRYS ER 20 MEQ PO TBCR: 40.0000 meq | EXTENDED_RELEASE_TABLET | Freq: Every day | ORAL | Status: DC

## 2015-01-15 NOTE — Discharge Summary (Signed)
Discharge Summary   Patient ID: Jessica [name],  MRN: [id number], DOB/AGE: [birth year] 54 y.o.  Admit date: 01/13/2015 Discharge date: 01/15/2015  Primary Care Provider: No PCP Per Patient Primary Cardiologist: Jorgeluis Gurganus   Discharge Diagnoses    Principal Problem:   Acute on chronic systolic (congestive) heart failure (HCC) Active Problems:   ICD (implantable cardioverter-defibrillator) in place   Pulmonary HTN (HCC)   LV (left ventricular) mural thrombus (HCC)   Ischemic cardiomyopathy   CAD (coronary artery disease)   Impetigo   Acute kidney injury superimposed on chronic kidney disease (HCC)   Hyponatremia   Hypomagnesemia   Diabetes mellitus type 2, uncontrolled (HCC)   Allergies Allergies  Allergen Reactions  . Nyquil Multi-Symptom [Pseudoeph-Doxylamine-Dm-Apap] Other (See Comments)    Fatigue, chest pressure and pain  . Sudafed [Pseudoephedrine] Other (See Comments)    Fatigue, chest pressure and pain  . Zocor [Simvastatin]     Myalgias   . Latex Rash  . Ramipril Cough    Diagnostic Studies/Procedures    None _____________   Hospital Course   Jessica [name] is a 54 y/o F with history of ICM, chronic systolic heart failure EF 25%,CAD s/p multiple interventions (see PMH),ICD (Biotronic), DM, hyperlipidemia, tobacco abuse, DM, CKD II, arterial embolism (right iliofemoral embolectomy and compartment fasciotomy of the right leg by Dr. Darrick Penna with LLE DVT 06/2010), VT, LV thrombus on chronic Coumadin, pulmonary hypertension who was admitted with symptoms consistent with acute on chronic systolic CHF. She has seen Dr. Kendrick Fries as part of her [facility] work[facility]. PFTs were done in 7/15 suggesting an interstitial process (restrictive) with markedly decreased DLCO): FVC 74%%, FEV1 81%, ratio 107%, TLC 76%, DLCO 29%. CTA chest in 4/15 showed no PE but did have focal honeycomb changes in the lung bases.She was recently seen in the Advanced HF clinic on 01/11/15 by Dr. Shirlee Latch. Her  weight was up 6 lb compared to prior visit. She had also endorsed increased bilateral LEE and progressive dyspnea over a 3 week period. There was evidence of volume overload on physical exam. There was also concern for possible DVT however LE venous dopplers were negative. She was instructed to adjust her diuretic regimen with increase in torsemide to 60 mg BID, baseline KCl increased to QAM/55meq QPM, and metolazone increased to Wednesday and Saturday with extra 20 meq of potassium on metolazone days.Revatio was increased to TID. BMP showed SCr of 1.27 and K of 3.9. Na was low at 134. She was instructed to f/u in 1 week. However, since that time she had worsening symptoms and increased weight gain despite medical compliance. She also continues to smoke. CXR showed cardiomegaly and central venous congestion not changed from prior. SCr had increased to 1.55. Mg low. She was admitted for IV diuresis with IV Lasix and supplemental potassium/magnesium. There was concerned about low output given patient's complaint of weakness but she wished to hold off central access to measure CVPs and co-ox. Today she feels much better. Weight has not changed since admission (133lb) but she is -5.1L and symptomatically improved. F/u Cr 1.39 this AM, Na 133. BP remains chronically low - unable to tolerate ACE or bblocker. She will go home on previous regimen of torsemide  BID, metolazone 2.5mg  every Wednesday, KCl daily, and spironolactone  daily. Of note she had a superficial skin infection on her anterior forehead c/w impetigo, and was started on doxycycline. She will continue this for a 7 day course total (10 doses left).  Her INR  was subtherapeutic this admission - has not had INR checked since 07/2014. She says she "got tired of going." Importance of compliance was reinforced - she agreed to keeping f/u for this and verbalized understanding of the dangers and consequences if she remains unmonitored on  Coumadin. Pharmacy at [facility] recommends to continue  daily. I have sent a message to the CHF scheduler requesting a follow-up appointment as well as help with arranging INR f/u, and the office will call the patient with this information. She was also advised to f/u PCP for A1C 9.8.  Consultants: N/A _____________  Discharge Vitals Blood pressure 100/64, pulse 70, temperature 97.4 F (36.3 C), temperature source Oral, resp. rate 18, height  (1.651 m), weight 133 lb 2.2 oz (60.392 kg), SpO2 100 %.  Filed Weights   01/13/15 1922 01/14/15 0446 01/15/15 0601  Weight: 136 lb 8 oz (61.916 kg) 134 lb 15 oz (61.208 kg) 133 lb 2.2 oz (60.392 kg)   _____________  Labs     CBC  Recent Labs  01/13/15 1604  WBC 4.9  HGB 13.2  HCT 39.1  MCV 83.0  PLT 172   Basic Metabolic Panel  Recent Labs  01/14/15 0256 01/14/15 1316 01/15/15 0238  NA 135  --  133*  K 4.0  --  4.0  CL 93*  --  93*  CO2 29  --  30  GLUCOSE 90  --  180*  BUN 26*  --  30*  CREATININE 1.61*  --  1.39*  CALCIUM 9.0  --  9.1  MG  --  1.4* 2.6*   Hemoglobin A1C  Recent Labs  01/14/15 0015  HGBA1C 9.8*   _____________  Disposition   Pt is being discharged home today in good condition. _____________  Follow-up Plans & Appointments    Follow-up Information    Follow up with Arvilla Meres, MD.   Specialty:  Cardiology   Why:  Office will call you for your followup appointment as well as your Coumadin level check. Call office if you have not heard back in 3 days.   Contact information:   [address] [place] [address] [phone]       Follow up with Primary Care Provider.   Why:  Your diabetes is very uncontrolled. Make an appointment as soon as possible with your primary doctor to work on this.     Discharge Instructions    Diet - low sodium heart healthy    Complete by:  As directed      Increase activity slowly    Complete by:  As directed   Continue  antibiotic (doxycycline) until all doses are gone.  Your Torsemide, Potassium, and Metolazone doses have changed. Please see medicine list above.  You should continue Coumadin  daily for now - the office will call you to schedule early follow-up of your Coumadin level. It is extremely important that you comply with this follow-up. It is very dangerous to skip monitoring.  Your doctor has stopped your Excedrin migraine because of the extra aspirin and caffeine in it (since you are on a blood thinner). Talk to your primary doctor about alternatives.           Discharge Medications   Current Discharge Medication List    START taking these medications   Details  doxycycline (VIBRA-TABS) 100 MG tablet Take 1 tablet (100 mg total) by mouth every 12 (twelve) hours. Qty: 10 tablet, Refills: 0  CONTINUE these medications which have CHANGED   Details  metolazone (ZAROXOLYN) 2.5 MG tablet Take 1 tablet (2.5mg ) every Wednesday. Take an extra 20mEq dose of potassium with this. Qty: 30 tablet, Refills: 1    potassium chloride SA (K-DUR,KLOR-CON) 20 MEQ tablet Take 2 tablets (40 mEq total) by mouth daily. Qty: 60 tablet, Refills: 1    torsemide (DEMADEX) 20 MG tablet Take 2 tablets (40 mg total) by mouth 2 (two) times daily. Qty: 120 tablet, Refills: 1      CONTINUE these medications which have NOT CHANGED   Details  albuterol (PROVENTIL HFA;VENTOLIN HFA) 108 (90 BASE) MCG/ACT inhaler Inhale 2 puffs into the lungs every 6 (six) hours as needed for wheezing or shortness of breath.     albuterol (PROVENTIL) (2.5 MG/3ML) 0.083% nebulizer solution Take 2.5 mg by nebulization every 6 (six) hours as needed for wheezing or shortness of breath.    digoxin (LANOXIN) 0.25 MG tablet TAKE 1 TABLET (0.25 MG TOTAL) BY MOUTH DAILY.     magnesium oxide (MAG-OX) 400 (241.3 MG) MG tablet TAKE 1/2 TABLET (200MG ) EVERY DAY     spironolactone (ALDACTONE) 25 MG tablet TAKE 1 TABLET (25 MG TOTAL) BY  MOUTH DAILY.     warfarin (COUMADIN) 5 MG tablet Take 1 tablet (5 mg total) by mouth as directed. Take As Directed by Coumadin Clinic     pravastatin (PRAVACHOL) 40 MG tablet Take 1 tablet (40 mg total) by mouth every evening.     sildenafil (REVATIO) 20 MG tablet Take 1 tablet (20 mg total) by mouth 3 (three) times daily.       STOP taking these medications     aspirin-acetaminophen-caffeine (EXCEDRIN MIGRAINE) 250-250-65 MG tablet         _____________   Outstanding Labs/Studies   None  Duration of Discharge Encounter   Greater than 30 minutes including physician time.  Signed, Ronie Spiesayna Dunn PA-C 01/15/2015, 1:13 PM  Patient seen and examined with Ronie Spiesayna Dunn, PA-C. We discussed all aspects of the encounter. I agree with the assessment and plan as stated above.   She is improved with diuresis. BP too low for ACE/b-blocker. Not interested in advanced therapies. Will continue to follow in HF Clinic.   Rocky Rishel,MD 12:28 PM

## 2015-01-15 NOTE — Progress Notes (Signed)
Advanced Heart Failure Rounding Note   Subjective:    Diuresed well again yesterday. Renal function improved. SBP 80-90s. Feels better. Wants to go home  Cr 1.55 -> 1.61 -> 1.4    Objective:   Weight Range:  Vital Signs:   Temp:  [97.4 F (36.3 C)-98.8 F (37.1 C)] 97.4 F (36.3 C) (11/05 1025) Pulse Rate:  [65-73] 70 (11/05 1025) Resp:  [18] 18 (11/05 1025) BP: (84-102)/(60-69) 99/66 mmHg (11/05 1025) SpO2:  [97 %-100 %] 100 % (11/05 1025) Weight:  [60.392 kg (133 lb 2.2 oz)] 60.392 kg (133 lb 2.2 oz) (11/05 0601) Last BM Date: 01/14/15  Weight change: Filed Weights   01/13/15 1922 01/14/15 0446 01/15/15 0601  Weight: 61.916 kg (136 lb 8 oz) 61.208 kg (134 lb 15 oz) 60.392 kg (133 lb 2.2 oz)    Intake/Output:   Intake/Output Summary (Last 24 hours) at 01/15/15 1149 Last data filed at 01/15/15 1039  Gross per 24 hour  Intake    848 ml  Output   3750 ml  Net  -2902 ml     Physical exam: General: Pleasant, NAD Psych: Normal affect. Neuro: Alert and oriented X 3. Moves all extremities spontaneously. HEENT: Normal Neck: Supple without bruits,JVP 6 Lungs: Resp regular and unlabored, CTA. Heart: RRR no +s3, s4, or murmurs. PMI laterally displaced. Abdomen: Soft, non-tender, distended, BS + x 4.  Extremities: No clubbing, cyanosis or edema. DP/PT/Radials 2+ and equal bilaterally. Skin: multiple open back lesions, also with lesion on anterior forehead  Telemetry: NSR  Labs: Basic Metabolic Panel:  Recent Labs Lab 01/11/15 1204 01/13/15 1604 01/14/15 0256 01/14/15 1316 01/15/15 0238  NA 134* 131* 135  --  133*  K 3.9 4.0 4.0  --  4.0  CL 94* 92* 93*  --  93*  CO2 --  30  GLUCOSE 201* 283* 90  --  180*  BUN 21* 22* 26*  --  30*  CREATININE 1.27* 1.55* 1.61*  --  1.39*  CALCIUM 9.4 9.1 9.0  --  9.1  MG  --   --   --  1.4* 2.6*    Liver Function Tests: No results for input(s): AST, ALT, ALKPHOS, BILITOT, PROT, ALBUMIN in the  last 168 hours. No results for input(s): LIPASE, AMYLASE in the last 168 hours. No results for input(s): AMMONIA in the last 168 hours.  CBC:  Recent Labs Lab 01/11/15 1204 01/13/15 1604  WBC 4.8 4.9  HGB 13.1 13.2  HCT 40.2 39.1  MCV 84.1 83.0  PLT 157 172    Cardiac Enzymes: No results for input(s): CKTOTAL, CKMB, CKMBINDEX, TROPONINI in the last 168 hours.  BNP: BNP (last 3 results)  Recent Labs  05/05/14 1100 01/13/15 1712  BNP 472.8* 787.6*    ProBNP (last 3 results) No results for input(s): PROBNP in the last 8760 hours.    Other results:  Imaging: Dg Chest 2 View  01/13/2015  CLINICAL DATA:  Chest pain short of breath 2 weeks EXAM: CHEST  2 VIEW COMPARISON:  Radiograph 05/05/2014 FINDINGS: LEFT-sided pacemaker with single continuously overlies stable enlarged cardiac silhouette. No effusion, infiltrate, or pneumothorax. Mild central venous congestion is present. No acute osseous abnormality. IMPRESSION: Cardiomegaly and central venous congestion not changed from prior. Electronically Signed   By: Genevive Bi M.D.   On: 01/13/2015 17:02     Medications:     Scheduled Medications: . digoxin  0.25 mg Oral Daily  . doxycycline  100 mg Oral Q12H  . furosemide  80 mg Intravenous BID  . hydrocortisone cream   Topical TID  . insulin aspart  0-9 Units Subcutaneous 6 times per day  . potassium chloride  40 mEq Oral Daily  . pravastatin  40 mg Oral QPM  . sildenafil  20 mg Oral TID  . sodium chloride  3 mL Intravenous Q12H  . spironolactone  25 mg Oral Daily  . Warfarin - Pharmacist Dosing Inpatient   Does not apply q1800    Infusions:    PRN Medications: sodium chloride, acetaminophen, albuterol, ondansetron (ZOFRAN) IV, sodium chloride   Assessment:   1. Acute on chronic systolic HF 2. CAD 3. Ischemic Cardiomyopathy: EF 25%. ICD in place.  4. Skin Infection: appearance on face concerning for impetigo but also with multiple lesions on her  back. Will treat with doxycycline.  5. Acute on chronic renal insufficiency:  Plan/Discussion:    Back to baseline. Can go home today on previous regimen. Torsemide 40 bid (recently increased to 60 bid but will continue 40 bid(. Metolazone 2.5 every Wednesday. Spiro 25.  BP too low for ace or b-blocker. F/u HF Clinic 1 week.   Doxy 100 bid for 7 total days for skin infection.    Length of Stay: 2   Arvilla MeresBensimhon, Daniel MD 01/15/2015, 11:49 AM  Advanced Heart Failure Team Pager [phone] (M-F; 7a - 4p)  Please contact [facility] for night-coverage after hours (4p -7a ) and weekends on [email or web address]

## 2015-01-15 NOTE — Progress Notes (Signed)
Orders given from Dr. Antoine PocheHochrein to continue to monitor blood pressure.and patient. Will continue to monitor patient's blood pressure and status to end of shift.

## 2015-01-15 NOTE — Progress Notes (Signed)
Notified by nurse tech of low blood pressure of 88/69. Nurse rechecked blood pressure manually and result -84/60 heart rate 72. Patient states that she feels a little light-headed but for the most part is ok. Patient is sitting on the side of bed. Notified on-call doctor. Awaiting orders and/ or call back from doctor.

## 2015-01-15 NOTE — Progress Notes (Signed)
Patient was discharged to home accompanied by family members and NT via wheelchair. Discharge instructions given. Patient verbalized understanding.All personal belongings giving.Patient's stable. No signs and symptoms of distress noted. Denies any pain.

## 2015-01-20 ENCOUNTER — Ambulatory Visit (INDEPENDENT_AMBULATORY_CARE_PROVIDER_SITE_OTHER): Payer: Medicare Other | Admitting: *Deleted

## 2015-01-20 ENCOUNTER — Ambulatory Visit (HOSPITAL_COMMUNITY): Admission: RE | Admit: 2015-01-20 | Payer: Medicare Other | Source: Ambulatory Visit

## 2015-01-20 ENCOUNTER — Other Ambulatory Visit (HOSPITAL_COMMUNITY): Payer: Self-pay | Admitting: *Deleted

## 2015-01-20 ENCOUNTER — Other Ambulatory Visit: Payer: Self-pay | Admitting: Internal Medicine

## 2015-01-20 ENCOUNTER — Ambulatory Visit (HOSPITAL_COMMUNITY)
Admission: RE | Admit: 2015-01-20 | Discharge: 2015-01-20 | Disposition: A | Discharge status: Home / Self Care | Payer: Medicare Other | Source: Ambulatory Visit | Attending: Internal Medicine | Admitting: Internal Medicine

## 2015-01-20 VITALS — BP 112/74 | HR 84 | Wt 131.0 lb

## 2015-01-20 DIAGNOSIS — E785 Hyperlipidemia, unspecified: Secondary | ICD-10-CM | POA: Insufficient documentation

## 2015-01-20 DIAGNOSIS — K219 Gastro-esophageal reflux disease without esophagitis: Secondary | ICD-10-CM | POA: Insufficient documentation

## 2015-01-20 DIAGNOSIS — M79606 Pain in leg, unspecified: Secondary | ICD-10-CM

## 2015-01-20 DIAGNOSIS — Z9119 Patient's noncompliance with other medical treatment and regimen: Secondary | ICD-10-CM | POA: Insufficient documentation

## 2015-01-20 DIAGNOSIS — G4730 Sleep apnea, unspecified: Secondary | ICD-10-CM | POA: Insufficient documentation

## 2015-01-20 DIAGNOSIS — Z7901 Long term (current) use of anticoagulants: Secondary | ICD-10-CM | POA: Diagnosis not present

## 2015-01-20 DIAGNOSIS — I255 Ischemic cardiomyopathy: Secondary | ICD-10-CM | POA: Diagnosis not present

## 2015-01-20 DIAGNOSIS — J449 Chronic obstructive pulmonary disease, unspecified: Secondary | ICD-10-CM | POA: Insufficient documentation

## 2015-01-20 DIAGNOSIS — Z8249 Family history of ischemic heart disease and other diseases of the circulatory system: Secondary | ICD-10-CM | POA: Diagnosis not present

## 2015-01-20 DIAGNOSIS — Z5181 Encounter for therapeutic drug level monitoring: Secondary | ICD-10-CM

## 2015-01-20 DIAGNOSIS — E119 Type 2 diabetes mellitus without complications: Secondary | ICD-10-CM | POA: Diagnosis not present

## 2015-01-20 DIAGNOSIS — J45909 Unspecified asthma, uncomplicated: Secondary | ICD-10-CM | POA: Diagnosis not present

## 2015-01-20 DIAGNOSIS — Z86718 Personal history of other venous thrombosis and embolism: Secondary | ICD-10-CM | POA: Insufficient documentation

## 2015-01-20 DIAGNOSIS — Z833 Family history of diabetes mellitus: Secondary | ICD-10-CM | POA: Insufficient documentation

## 2015-01-20 DIAGNOSIS — I5022 Chronic systolic (congestive) heart failure: Secondary | ICD-10-CM | POA: Diagnosis not present

## 2015-01-20 DIAGNOSIS — I213 ST elevation (STEMI) myocardial infarction of unspecified site: Secondary | ICD-10-CM

## 2015-01-20 DIAGNOSIS — Z79899 Other long term (current) drug therapy: Secondary | ICD-10-CM | POA: Diagnosis not present

## 2015-01-20 DIAGNOSIS — M79672 Pain in left foot: Secondary | ICD-10-CM | POA: Insufficient documentation

## 2015-01-20 DIAGNOSIS — M79671 Pain in right foot: Secondary | ICD-10-CM | POA: Diagnosis not present

## 2015-01-20 DIAGNOSIS — I272 Other secondary pulmonary hypertension: Secondary | ICD-10-CM | POA: Diagnosis not present

## 2015-01-20 DIAGNOSIS — I513 Intracardiac thrombosis, not elsewhere classified: Secondary | ICD-10-CM

## 2015-01-20 DIAGNOSIS — Z95810 Presence of automatic (implantable) cardiac defibrillator: Secondary | ICD-10-CM | POA: Diagnosis not present

## 2015-01-20 DIAGNOSIS — I252 Old myocardial infarction: Secondary | ICD-10-CM | POA: Insufficient documentation

## 2015-01-20 DIAGNOSIS — I2510 Atherosclerotic heart disease of native coronary artery without angina pectoris: Secondary | ICD-10-CM | POA: Insufficient documentation

## 2015-01-20 LAB — COMPREHENSIVE METABOLIC PANEL
ALT: 12 U/L — ABNORMAL LOW (ref 14–54)
ANION GAP: 11 (ref 5–15)
AST: 26 U/L (ref 15–41)
Albumin: 3.6 g/dL (ref 3.5–5.0)
Alkaline Phosphatase: 123 U/L (ref 38–126)
BUN: 40 mg/dL — ABNORMAL HIGH (ref 6–20)
CHLORIDE: 93 mmol/L — AB (ref 101–111)
CO2: 29 mmol/L (ref 22–32)
Calcium: 9.7 mg/dL (ref 8.9–10.3)
Creatinine, Ser: 1.79 mg/dL — ABNORMAL HIGH (ref 0.44–1.00)
GFR, EST AFRICAN AMERICAN: 35 mL/min — AB (ref >60)
GFR, EST NON AFRICAN AMERICAN: 31 mL/min — AB (ref >60)
Glucose, Bld: 198 mg/dL — ABNORMAL HIGH (ref 65–99)
POTASSIUM: 4.3 mmol/L (ref 3.5–5.1)
Sodium: 133 mmol/L — ABNORMAL LOW (ref 135–145)
Total Bilirubin: 0.8 mg/dL (ref 0.3–1.2)
Total Protein: 8.6 g/dL — ABNORMAL HIGH (ref 6.5–8.1)

## 2015-01-20 LAB — PROTIME-INR
INR: 4.44 — AB (ref <1.50)
Prothrombin Time: 43 seconds — ABNORMAL HIGH (ref 11.6–15.2)

## 2015-01-20 LAB — POCT INR: INR: 6.4

## 2015-01-20 LAB — DIGOXIN LEVEL: DIGOXIN LVL: 1.6 ng/mL (ref 0.8–2.0)

## 2015-01-20 NOTE — Addendum Note (Signed)
Encounter addended by: Theresia Boughhantel M Saydie Gerdts, CMA on: 01/20/2015  3:14 PM<BR>     Documentation filed: Orders, Dx Association, Patient Instructions Section

## 2015-01-20 NOTE — Patient Instructions (Signed)
LABS TODAY  Your physician has requested that you have an ankle brachial index (ABI). During this test an ultrasound and blood pressure cuff are used to evaluate the arteries that supply the arms and legs with blood. Allow thirty minutes for this exam. There are no restrictions or special instructions.  Your physician recommends that you schedule a follow-up appointment in: 6 WEEKS WITH DR. Gala RomneyBENSIMHON  Do the following things EVERYDAY: 1) Weigh yourself in the morning before breakfast. Write it down and keep it in a log. 2) Take your medicines as prescribed 3) Eat low salt foods-Limit salt (sodium) to 2000 mg per day.  4) Stay as active as you can everyday 5) Limit all fluids for the day to less than 2 liters 6)

## 2015-01-20 NOTE — Progress Notes (Signed)
Advanced Heart Failure Clinic Note   Patient ID: Jessica [name], female   DOB: [birth year], 54 y.o.   MRN: [id number] PCP: Dr Dierdre Searles EP: Dr Ladona Ridgel HF: Dr. Gala Romney  HPI: Jessica [name] is a 54 y.o. with a history of ICM, chronic systolic heart failure EF 25%,  CAD s/p multiple interventions, ICD Biotronic, DM, hyperlipidemia, smoker, S/P right iliofemoral embolectomy and compartment fasciotomy of the right leg by Dr. Darrick Penna with LLE DVT 06/2010.  S/P ICD discharge 03/13/13.    She has seen Dr. Kendrick Fries as part of her [facility] workup.  PFTs were done in 7/15 suggesting an interstitial process (restrictive) with markedly decreased DLCO): FVC 74%%, FEV1 81%, ratio 107%, TLC 76%, DLCO 29%. CTA chest in 4/15 showed no PE but did have focal honeycomb changes in the lung bases.   Admitted  11/3-11/5/16 for recurrent HF. Diuresed from 140 -> 133.   She returns today for regular follow up. Feeling much better. Weight down to 131. Says she feels like her old self. No edema, orthopnea, PND. Able to do all her ADLs without problems. SOB if going up steps. Not dizzy. Taking torsemide 40 bid. Occasional metolazone 1-2x/week.   RHC 07/23/2013: RA 15, RV 78/5/18, PA 79/31 (47), PCWP 12, Fick CO/CI 2.1/1.3, Thermo CO/CI 3.4/2.0, PVR 16.5, PA 39% and 40%  ECHO 02/22/11: Only basal function preserved. EF 20-25%. ECHO 12/20/2011 EF 15%  ECHO 6/15 EF 25% with diffuse hypokinesis, severe LV dilation, mild MR, RV mildly dilated with moderately decreased systolic function.  ECHO 03/25/14 EF 20-25%. Moderate RV dysfunction + apical clot  6 minute walk (7/15): 384 m 6 MW (01/28/14): 1100 ft (385m), O2 sats ranged from 98-92% and HR ranged from 85-120  ECG: NSR, PVCs, left axis deviation  Labs: 03/13/13 K 3.8 Creatinine 0.90 03/17/13 K 4.7 Creatinine 0.94  Magnesium 1.6 Started on magnesium 4/15 K 3.7 => 4.6, Creatinine 0.89 => 1.23 Pro BNP 1848  07/14/2013: K 4.1, creatinine 0.88, digoxin 1.0, Pro BNP 2130 08/05/13 Dig 0.5  6/15 K  3.8, creatinine 1.2, digoxin 0.5, HCT 43.6 09/30/13 HIV NR, ANA negative 2/16 digoxin 0.7, K 4, creatinine 1.01, HCT 42.1  ROS: All systems negative except as listed in HPI, PMH and Problem List.  SH: smokes cigar daily, Drinks occasional beer; lives with boyfriend and 2 children in [place].   FH: Mom living: DM2, HTN, CKD  Father deceased: MI, CAD, HTN   Past Medical History  Diagnosis Date  . Chronic systolic heart failure (HCC)     a.  secondary to severe ischemic cardiomyopathy with EF 20-25%;  b. s/p rimplantation of Biotronik single chamber ICD by Dr Ladona Ridgel 02/2008;  c.  RHC 07/2013 showed well compensated left sided pressures with moderate PAH and low cardiac output. d. 2D Echo 03/2014: EF 20-25%, grade 2 DD, PAP .  [name] CAD (coronary artery disease)     a. 1996: anterior wall MI tx with PTCA. b. 2003: PCI/DES to circumflex marginal. c. 2004: PTCA/CBA/stenting of ostium of D1 and LAD. d. 06/2003: s/p PTCA/DES of mRCA. e. 11/2003: CBA of bifurcation diagonal LAD. f. 2007: PTCA/DES to RCA, CBA/stenting to OM2.  . Tobacco abuse   . Alcohol abuse   . History of noncompliance with medical treatment   . Diabetes mellitus, type 2 (HCC)   . Dizziness   . HLD (hyperlipidemia)   . DVT (deep venous thrombosis) (HCC)     a. 06/2010.  . Arterial embolism (HCC)     a. H/o RLE extremity  ischemia in 2012 s/p right iliofemoral embolectomy with compartment fasciotomy.  . V-tach (HCC)   . AICD (automatic cardioverter/defibrillator) present     a. Biotronik placed 2009. b. ICD discharge 2011, 03/2013.  [name]. Asthma   . Sleep apnea   . GERD (gastroesophageal reflux disease)   . Anxiety   . Myocardial infarction (HCC)   . COPD (chronic obstructive pulmonary disease) (HCC)   . LV (left ventricular) mural thrombus (HCC)     a. Dx on echo 03/2014.  . Pulmonary hypertension (HCC)   . CKD (chronic kidney disease), stage II     Current Outpatient Prescriptions  Medication Sig Dispense Refill  .  albuterol (PROVENTIL HFA;VENTOLIN HFA) 108 (90 BASE) MCG/ACT inhaler Inhale 2 puffs into the lungs every 6 (six) hours as needed for wheezing or shortness of breath. 1 Inhaler 2  . albuterol (PROVENTIL) (2.5 MG/3ML) 0.083% nebulizer solution Take 2.5 mg by nebulization every 6 (six) hours as needed for wheezing or shortness of breath.    . digoxin (LANOXIN) 0.25 MG tablet TAKE 1 TABLET (0.25 MG TOTAL) BY MOUTH DAILY. (Patient taking differently: TAKE 1 TABLET (0.25 MG TOTAL) BY MOUTH DAILY AT BEDTIME) 30 tablet 5  . doxycycline (VIBRA-TABS) 100 MG tablet Take 1 tablet (100 mg total) by mouth every 12 (twelve) hours. 10 tablet 0  . magnesium oxide (MAG-OX) 400 (241.3 MG) MG tablet TAKE 1/2 TABLET (200MG ) EVERY DAY (Patient taking differently: TAKE 1/2 TABLET (200MG ) EVERY DAILY AT BEDTIME) 15 tablet 3  . metolazone (ZAROXOLYN) 2.5 MG tablet Take 1 tablet (2.5mg ) every Wednesday. Take an extra 20mEq dose of potassium with this. 30 tablet 1  . potassium chloride SA (K-DUR,KLOR-CON) 20 MEQ tablet Take 2 tablets (40 mEq total) by mouth daily. 60 tablet 1  . pravastatin (PRAVACHOL) 40 MG tablet Take 1 tablet (40 mg total) by mouth every evening. 90 tablet 3  . sildenafil (REVATIO) 20 MG tablet Take 1 tablet (20 mg total) by mouth 3 (three) times daily. 180 tablet 6  . spironolactone (ALDACTONE) 25 MG tablet TAKE 1 TABLET (25 MG TOTAL) BY MOUTH DAILY. 30 tablet 4  . torsemide (DEMADEX) 20 MG tablet Take 2 tablets (40 mg total) by mouth 2 (two) times daily. 120 tablet 1  . warfarin (COUMADIN) 5 MG tablet Take 1 tablet (5 mg total) by mouth as directed. Take As Directed by Coumadin Clinic (Patient taking differently: Take 2.5 mg by mouth daily at 6 PM. Take As Directed by Coumadin Clinic) 30 tablet 1   No current facility-administered medications for this encounter.      Filed Vitals:   01/20/15 1446  BP: 112/74  Pulse: 84  Weight: 131 lb (59.421 kg)  SpO2: 93%   Wt Readings from Last 3 Encounters:    01/20/15 131 lb (59.421 kg)  01/15/15 133 lb 2.2 oz (60.392 kg)  01/11/15 140 lb 12.8 oz (63.866 kg)    PHYSICAL EXAM: General:  NAD   HEENT:  Normal Neck: supple. JVP 6 cm.  Carotids 2+ bilaterally; no bruits. No thyromegaly or nodule Cor: PMI laterally displaced. RRR; wide split S2, soft S3 Lungs: CTA, increase effort Abdomen: obese, soft, NT, ND, no HSM. No bruits or masses. +BS Extremities: no cyanosis, clubbing, rash.  Trace ankle edema. Absent bilateral pedal pulses  Neuro: alert & orientedx3, cranial nerves grossly intact. Moves all 4 extremities w/o difficulty. Affect pleasant.  ASSESSMENT & PLAN: 1. Chronic systolic heart failure: Ischemic cardiomyopathy.  Echo (1/16) with EF 20-25% with  mod RV dysfunction  S/P Biotronic ICD 2009.  NYHA III.   - Volume status much improved since hospitalization. Looks great today.  - Continue torsemide 40 mg BID and metolazone twice a week as needed. Take extra 20 meq of potassium on metolazone days.  Increase daily KCl to 40 qam/20 qpm.  - Intolerant to BBs with hypotension even at low dose.  ACEI likely will be limited by same issue.   - Continue spironolactone 25 mg daily  - Continue digoxin 0.25 mg daily, last digoxin level 0.9. Recheck today.  - She is not interested in LVAD but would be open to the possibility of a cardiac transplantation but not good candidate due to noncompliance and tobacco use. 2.  CAD: S/P multiple interventions. No chest pain.  - Continue ASA 8. - Stopped zocor with cost and muscle aches.  Continue pravastatin 40 mg daily, needs lipids/LFTs in 2 months.    - No longer smoking cigars. Using vapor. 3.  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   - Continue Revatio to 20 mg tid.  No NTG with Revatio.  - No indication for CPAP on sleep study.  - Based on CT and PFTs, concern for interstitial lung disease. Follows with pulmonary.  - Serologic workup: ANA negative, anti-SCL70, RF, HIV  NR   - 10/2013  V/Q scan--> normal   4. LV thrombus: On coumadin. Non-compliant with INR checks at [facility] - Stressed importance of regular follow up in coumadin clinic. 5. Ongoing pain in feet. - question PAD or arthritis. LE u/s no DVT. Will get ABIs  Arvilla Meres MD  2:58 PM

## 2015-01-24 ENCOUNTER — Ambulatory Visit (HOSPITAL_COMMUNITY): Admission: RE | Admit: 2015-01-24 | Payer: Medicare Other | Source: Ambulatory Visit

## 2015-01-25 ENCOUNTER — Ambulatory Visit (INDEPENDENT_AMBULATORY_CARE_PROVIDER_SITE_OTHER): Payer: Medicare Other | Admitting: *Deleted

## 2015-01-25 DIAGNOSIS — Z5181 Encounter for therapeutic drug level monitoring: Secondary | ICD-10-CM | POA: Diagnosis not present

## 2015-01-25 DIAGNOSIS — I513 Intracardiac thrombosis, not elsewhere classified: Secondary | ICD-10-CM

## 2015-01-25 DIAGNOSIS — I213 ST elevation (STEMI) myocardial infarction of unspecified site: Secondary | ICD-10-CM

## 2015-01-25 LAB — POCT INR: INR: 5.5

## 2015-02-01 ENCOUNTER — Ambulatory Visit (INDEPENDENT_AMBULATORY_CARE_PROVIDER_SITE_OTHER): Payer: Medicare Other | Admitting: *Deleted

## 2015-02-01 DIAGNOSIS — Z5181 Encounter for therapeutic drug level monitoring: Secondary | ICD-10-CM

## 2015-02-01 DIAGNOSIS — I213 ST elevation (STEMI) myocardial infarction of unspecified site: Secondary | ICD-10-CM | POA: Diagnosis not present

## 2015-02-01 DIAGNOSIS — I513 Intracardiac thrombosis, not elsewhere classified: Secondary | ICD-10-CM

## 2015-02-01 LAB — POCT INR: INR: 1.9

## 2015-02-01 MED ORDER — WARFARIN SODIUM 5 MG PO TABS
ORAL_TABLET | ORAL | Status: DC
Start: 2015-02-01 — End: 2015-09-28

## 2015-02-02 ENCOUNTER — Telehealth (HOSPITAL_COMMUNITY): Payer: Self-pay | Admitting: *Deleted

## 2015-02-02 MED ORDER — DIGOXIN 250 MCG PO TABS: 0.1250 mg | ORAL_TABLET | Freq: Every day | ORAL | Status: DC

## 2015-02-02 NOTE — Telephone Encounter (Signed)
-----   Message from Dolores Pattyaniel R Bensimhon, MD sent at 01/30/2015  4:23 PM EST ----- Decrease digoxin to 0.125 daily. Repeat level 2 weeks.

## 2015-02-02 NOTE — Telephone Encounter (Signed)
Notes Recorded by Noralee SpaceHeather M Trinady Milewski, RN on 02/02/2015 at 4:36 PM Pt aware, agreeable and verbalizes understanding, repeat labs sch for 12/9

## 2015-02-08 ENCOUNTER — Ambulatory Visit (INDEPENDENT_AMBULATORY_CARE_PROVIDER_SITE_OTHER): Payer: Medicare Other

## 2015-02-08 DIAGNOSIS — Z5181 Encounter for therapeutic drug level monitoring: Secondary | ICD-10-CM | POA: Diagnosis not present

## 2015-02-08 DIAGNOSIS — I513 Intracardiac thrombosis, not elsewhere classified: Secondary | ICD-10-CM

## 2015-02-08 DIAGNOSIS — I213 ST elevation (STEMI) myocardial infarction of unspecified site: Secondary | ICD-10-CM | POA: Diagnosis not present

## 2015-02-08 LAB — POCT INR: INR: 2.5

## 2015-02-18 ENCOUNTER — Other Ambulatory Visit (INDEPENDENT_AMBULATORY_CARE_PROVIDER_SITE_OTHER): Payer: Medicare Other | Admitting: *Deleted

## 2015-02-18 ENCOUNTER — Other Ambulatory Visit (HOSPITAL_COMMUNITY): Payer: Medicare Other

## 2015-02-18 ENCOUNTER — Ambulatory Visit (INDEPENDENT_AMBULATORY_CARE_PROVIDER_SITE_OTHER): Payer: Medicare Other | Admitting: *Deleted

## 2015-02-18 DIAGNOSIS — E7800 Pure hypercholesterolemia, unspecified: Secondary | ICD-10-CM

## 2015-02-18 DIAGNOSIS — I213 ST elevation (STEMI) myocardial infarction of unspecified site: Secondary | ICD-10-CM | POA: Diagnosis not present

## 2015-02-18 DIAGNOSIS — Z5181 Encounter for therapeutic drug level monitoring: Secondary | ICD-10-CM | POA: Diagnosis not present

## 2015-02-18 DIAGNOSIS — I513 Intracardiac thrombosis, not elsewhere classified: Secondary | ICD-10-CM

## 2015-02-18 LAB — DIGOXIN LEVEL: DIGOXIN LVL: 0.8 ug/L (ref 0.8–2.0)

## 2015-02-18 LAB — POCT INR: INR: 4.5

## 2015-03-03 ENCOUNTER — Ambulatory Visit (HOSPITAL_COMMUNITY)
Admission: RE | Admit: 2015-03-03 | Discharge: 2015-03-03 | Disposition: A | Discharge status: Home / Self Care | Payer: Medicare Other | Source: Ambulatory Visit | Attending: Internal Medicine | Admitting: Internal Medicine

## 2015-03-03 ENCOUNTER — Ambulatory Visit (INDEPENDENT_AMBULATORY_CARE_PROVIDER_SITE_OTHER): Payer: Medicare Other | Admitting: Pharmacist

## 2015-03-03 VITALS — BP 105/69 | HR 89 | Ht 65.0 in | Wt 136.0 lb

## 2015-03-03 DIAGNOSIS — I272 Other secondary pulmonary hypertension: Secondary | ICD-10-CM

## 2015-03-03 DIAGNOSIS — I5022 Chronic systolic (congestive) heart failure: Secondary | ICD-10-CM | POA: Insufficient documentation

## 2015-03-03 DIAGNOSIS — M79671 Pain in right foot: Secondary | ICD-10-CM | POA: Diagnosis not present

## 2015-03-03 DIAGNOSIS — M79672 Pain in left foot: Secondary | ICD-10-CM | POA: Insufficient documentation

## 2015-03-03 DIAGNOSIS — Z5181 Encounter for therapeutic drug level monitoring: Secondary | ICD-10-CM | POA: Diagnosis not present

## 2015-03-03 DIAGNOSIS — I213 ST elevation (STEMI) myocardial infarction of unspecified site: Secondary | ICD-10-CM | POA: Insufficient documentation

## 2015-03-03 DIAGNOSIS — I2583 Coronary atherosclerosis due to lipid rich plaque: Secondary | ICD-10-CM

## 2015-03-03 DIAGNOSIS — I2510 Atherosclerotic heart disease of native coronary artery without angina pectoris: Secondary | ICD-10-CM

## 2015-03-03 DIAGNOSIS — I513 Intracardiac thrombosis, not elsewhere classified: Secondary | ICD-10-CM

## 2015-03-03 LAB — BASIC METABOLIC PANEL
ANION GAP: 11 (ref 5–15)
BUN: 21 mg/dL — ABNORMAL HIGH (ref 6–20)
CO2: 28 mmol/L (ref 22–32)
Calcium: 9.4 mg/dL (ref 8.9–10.3)
Chloride: 96 mmol/L — ABNORMAL LOW (ref 101–111)
Creatinine, Ser: 1.21 mg/dL — ABNORMAL HIGH (ref 0.44–1.00)
GFR calc Af Amer: 57 mL/min — ABNORMAL LOW (ref >60)
GFR calc non Af Amer: 49 mL/min — ABNORMAL LOW (ref >60)
GLUCOSE: 196 mg/dL — AB (ref 65–99)
POTASSIUM: 3.8 mmol/L (ref 3.5–5.1)
Sodium: 135 mmol/L (ref 135–145)

## 2015-03-03 LAB — POCT INR: INR: 1.3

## 2015-03-03 NOTE — Patient Instructions (Signed)
Lab today  We will contact you in 3 months to schedule your next appointment.  

## 2015-03-03 NOTE — Progress Notes (Signed)
Advanced Heart Failure Medication Review by a Pharmacist  Does the patient  feel that his/her medications are working for him/her?  yes  Has the patient been experiencing any side effects to the medications prescribed?  no  Does the patient measure his/her own blood pressure or blood glucose at home?  yes   Does the patient have any problems obtaining medications due to transportation or finances?   no  Understanding of regimen: good Understanding of indications: good Potential of compliance: good Patient understands to avoid NSAIDs. Patient understands to avoid decongestants.  Issues to address at subsequent visits: None   Pharmacist comments:  Ms. Everardo Allllison is a pleasant 54 yo F presenting without a medication list but with good recall of her regimen including dosages. She reports good compliance with her medications but states that her pharmacy has not been able to get Revatio so she has been out for at least a week now. I have called [facility] who states that they do have it in stock now and will be filling it for her today.   Tyler DeisErika K. Bonnye FavaNicolsen, PharmD, BCPS, CPP Clinical Pharmacist Pager: [phone] Phone: [phone] 03/03/2015 12:02 PM    Time with patient: 8 minutes Preparation and documentation time: 10 minutes Total time: 18 minutes

## 2015-03-03 NOTE — Addendum Note (Signed)
Encounter addended by: Noralee SpaceHeather M Rondrick Barreira, RN on: 03/03/2015 12:14 PM<BR>     Documentation filed: Dx Association, Patient Instructions Section, Orders

## 2015-03-03 NOTE — Progress Notes (Signed)
Advanced Heart Failure Clinic Note   Patient ID: Jessica [name], female   DOB: 12/22/1960, 54 y.o.   MRN: [id number]  EP: Dr Ladona Ridgel HF: Dr. Gala Romney  HPI: Jessica [name] is a 54 y.o. with a history of ICM, chronic systolic heart failure EF 25%,  CAD s/p multiple interventions, ICD Biotronic, DM, hyperlipidemia, smoker, S/P right iliofemoral embolectomy and compartment fasciotomy of the right leg by Dr. Darrick Penna with LLE DVT 06/2010.  S/P ICD discharge 03/13/13.    She has seen Dr. Kendrick Fries as part of her [facility] workup.  PFTs were done in 7/15 suggesting an interstitial process (restrictive) with markedly decreased DLCO): FVC 74%%, FEV1 81%, ratio 107%, TLC 76%, DLCO 29%. CTA chest in 4/15 showed no PE but did have focal honeycomb changes in the lung bases.   Admitted  11/3-11/5/16 for recurrent HF. Diuresed from 140 -> 133.   HF follow-up:  She returns today for regular follow up. Weight up a few pounds. Taking torsemide 40 bid and metolazone 1-2x/week. Mild ab bloating. No edema, orthopnea, PND. Able to do all her ADLs without problems. Mild exertional dyspnea. No CP. Has been out of Revatio x 1 week because Pharmacy ran out. Still with pain in feet.    RHC 07/23/2013: RA 15, RV 78/5/18, PA 79/31 (47), PCWP 12, Fick CO/CI 2.1/1.3, Thermo CO/CI 3.4/2.0, PVR 16.5, PA 39% and 40%  ECHO 02/22/11: Only basal function preserved. EF 20-25%. ECHO 12/20/2011 EF 15%  ECHO 6/15 EF 25% with diffuse hypokinesis, severe LV dilation, mild MR, RV mildly dilated with moderately decreased systolic function.  ECHO 03/25/14 EF 20-25%. Moderate RV dysfunction + apical clot  6 minute walk (7/15): 384 m 6 MW (01/28/14): 1100 ft (353m), O2 sats ranged from 98-92% and HR ranged from 85-120  ECG: NSR, PVCs, left axis deviation  Labs: 03/13/13 K 3.8 Creatinine 0.90 03/17/13 K 4.7 Creatinine 0.94  Magnesium 1.6 Started on magnesium 4/15 K 3.7 => 4.6, Creatinine 0.89 => 1.23 Pro BNP 1848  07/14/2013: K 4.1, creatinine 0.88, digoxin  1.0, Pro BNP 2130 08/05/13 Dig 0.5  6/15 K 3.8, creatinine 1.2, digoxin 0.5, HCT 43.6 09/30/13 HIV NR, ANA negative 2/16 digoxin 0.7, K 4, creatinine 1.01, HCT 42.1  ROS: All systems negative except as listed in HPI, PMH and Problem List.  SH: smokes cigar daily, Drinks occasional beer; lives with boyfriend and 2 children in [place].   FH: Mom living: DM2, HTN, CKD  Father deceased: MI, CAD, HTN   Past Medical History  Diagnosis Date  . Chronic systolic heart failure (HCC)     a.  secondary to severe ischemic cardiomyopathy with EF 20-25%;  b. s/p rimplantation of Biotronik single chamber ICD by Dr Ladona Ridgel 02/2008;  c.  RHC 07/2013 showed well compensated left sided pressures with moderate PAH and low cardiac output. d. 2D Echo 03/2014: EF 20-25%, grade 2 DD, PAP .  [name] CAD (coronary artery disease)     a. 1996: anterior wall MI tx with PTCA. b. 2003: PCI/DES to circumflex marginal. c. 2004: PTCA/CBA/stenting of ostium of D1 and LAD. d. 06/2003: s/p PTCA/DES of mRCA. e. 11/2003: CBA of bifurcation diagonal LAD. f. 2007: PTCA/DES to RCA, CBA/stenting to OM2.  . Tobacco abuse   . Alcohol abuse   . History of noncompliance with medical treatment   . Diabetes mellitus, type 2 (HCC)   . Dizziness   . HLD (hyperlipidemia)   . DVT (deep venous thrombosis) (HCC)     a. 06/2010.  [name]  Arterial embolism (HCC)     a. H/o RLE extremity ischemia in 2012 s/p right iliofemoral embolectomy with compartment fasciotomy.  . V-tach (HCC)   . AICD (automatic cardioverter/defibrillator) present     a. Biotronik placed 2009. b. ICD discharge 2011, 03/2013.  [name]. Asthma   . Sleep apnea   . GERD (gastroesophageal reflux disease)   . Anxiety   . Myocardial infarction (HCC)   . COPD (chronic obstructive pulmonary disease) (HCC)   . LV (left ventricular) mural thrombus (HCC)     a. Dx on echo 03/2014.  . Pulmonary hypertension (HCC)   . CKD (chronic kidney disease), stage II     Current Outpatient  Prescriptions  Medication Sig Dispense Refill  . albuterol (PROVENTIL HFA;VENTOLIN HFA) 108 (90 BASE) MCG/ACT inhaler Inhale 2 puffs into the lungs every 6 (six) hours as needed for wheezing or shortness of breath. 1 Inhaler 2  . albuterol (PROVENTIL) (2.5 MG/3ML) 0.083% nebulizer solution Take 2.5 mg by nebulization every 6 (six) hours as needed for wheezing or shortness of breath.    . digoxin (LANOXIN) 0.25 MG tablet Take 0.5 tablets (0.125 mg total) by mouth daily. 30 tablet 5  . magnesium oxide (MAG-OX) 400 MG tablet Take 200 mg by mouth daily.    . metolazone (ZAROXOLYN) 2.5 MG tablet Take 1 tablet (2.5mg ) every Wednesday. Take an extra 20mEq dose of potassium with this. 30 tablet 1  . potassium chloride SA (K-DUR,KLOR-CON) 20 MEQ tablet Take 2 tablets (40 mEq total) by mouth daily. 60 tablet 1  . pravastatin (PRAVACHOL) 40 MG tablet Take 1 tablet (40 mg total) by mouth every evening. 90 tablet 3  . spironolactone (ALDACTONE) 25 MG tablet TAKE 1 TABLET (25 MG TOTAL) BY MOUTH DAILY. 30 tablet 4  . torsemide (DEMADEX) 20 MG tablet Take 2 tablets (40 mg total) by mouth 2 (two) times daily. 120 tablet 1  . warfarin (COUMADIN) 5 MG tablet Take As Directed by Coumadin Clinic 30 tablet 1  . sildenafil (REVATIO) 20 MG tablet Take 1 tablet (20 mg total) by mouth 3 (three) times daily. (Patient not taking: Reported on 03/03/2015) 180 tablet 6   No current facility-administered medications for this encounter.      Filed Vitals:   03/03/15 1134  BP: 105/69  Pulse: 89  Height: 5\' 5"  (1.651 m)  Weight: 136 lb (61.689 kg)  SpO2: 97%   Wt Readings from Last 3 Encounters:  03/03/15 136 lb (61.689 kg)  01/20/15 131 lb (59.421 kg)  01/15/15 133 lb 2.2 oz (60.392 kg)    PHYSICAL EXAM: General:  NAD   HEENT:  Normal Neck: supple. JVP 8 cm.  Carotids 2+ bilaterally; no bruits. No thyromegaly or nodule Cor: PMI laterally displaced. RRR; wide split S2, soft S3 Lungs: CTA, increase  effort Abdomen: obese, soft, NT, ND, no HSM. No bruits or masses. +BS Extremities: no cyanosis, clubbing, rash.  Trace ankle edema. Absent bilateral pedal pulses  Neuro: alert & orientedx3, cranial nerves grossly intact. Moves all 4 extremities w/o difficulty. Affect pleasant.  ASSESSMENT & PLAN: 1. Chronic systolic heart failure: Ischemic cardiomyopathy.  Echo (1/16) with EF 20-25% with mod RV dysfunction  S/P Biotronic ICD 2009.  NYHA III.   - Volume status slightly elevated. Take a metolazone today.  - Continue torsemide 40 mg BID and metolazone twice a week as needed. Take extra 20 meq of potassium on metolazone days.  Continue KCl 40 qam/20 qpm.  - Intolerant to BBs with hypotension  even at low dose.  ACEI likely will be limited by same issue.   - Continue spironolactone 25 mg daily  - Continue digoxin 0.25 mg daily, last digoxin level 0.8 - She is not interested in LVAD but would be open to the possibility of a cardiac transplantation but not good candidate due to noncompliance and tobacco use. 2.  CAD: S/P multiple interventions. No chest pain.  - Continue ASA 81 - Stopped zocor with cost and muscle aches.  Continue pravastatin 40 mg daily, needs lipids/LFTs in 2 months.    - No longer smoking cigars. Using vapor. 3.  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   - She has been out of Revatio x 1 week as pharmacy ran out. We will contact her pharmacy to see if we can help them get it for her.  No NTG with Revatio.  - No indication for CPAP on sleep study.  - Based on CT and PFTs, concern for interstitial lung disease. Follows with pulmonary.  - Serologic workup: ANA negative, anti-SCL70, RF, HIV NR   - 10/2013  V/Q scan--> normal   4. LV thrombus: On coumadin. Non-compliant with INR checks at [facility] - Stressed importance of regular follow up in coumadin clinic. 5. Ongoing pain in feet. - ABIs ordered last visit but not done. Will reorder.   Arvilla Meres MD  11:55 AM

## 2015-03-10 ENCOUNTER — Ambulatory Visit (INDEPENDENT_AMBULATORY_CARE_PROVIDER_SITE_OTHER): Payer: Medicare Other | Admitting: Pharmacist

## 2015-03-10 DIAGNOSIS — Z5181 Encounter for therapeutic drug level monitoring: Secondary | ICD-10-CM | POA: Diagnosis not present

## 2015-03-10 DIAGNOSIS — I513 Intracardiac thrombosis, not elsewhere classified: Secondary | ICD-10-CM

## 2015-03-10 DIAGNOSIS — I213 ST elevation (STEMI) myocardial infarction of unspecified site: Secondary | ICD-10-CM

## 2015-03-10 LAB — POCT INR: INR: 1.6

## 2015-05-24 ENCOUNTER — Ambulatory Visit (INDEPENDENT_AMBULATORY_CARE_PROVIDER_SITE_OTHER): Payer: Medicare Other

## 2015-05-24 DIAGNOSIS — I213 ST elevation (STEMI) myocardial infarction of unspecified site: Secondary | ICD-10-CM | POA: Diagnosis not present

## 2015-05-24 DIAGNOSIS — Z5181 Encounter for therapeutic drug level monitoring: Secondary | ICD-10-CM | POA: Diagnosis not present

## 2015-05-24 DIAGNOSIS — I513 Intracardiac thrombosis, not elsewhere classified: Secondary | ICD-10-CM

## 2015-05-24 LAB — POCT INR: INR: 1.2

## 2015-05-26 ENCOUNTER — Other Ambulatory Visit: Payer: Self-pay | Admitting: Physician Assistant

## 2015-05-31 ENCOUNTER — Ambulatory Visit (INDEPENDENT_AMBULATORY_CARE_PROVIDER_SITE_OTHER): Payer: Medicare Other | Admitting: Pharmacist

## 2015-05-31 DIAGNOSIS — Z5181 Encounter for therapeutic drug level monitoring: Secondary | ICD-10-CM | POA: Diagnosis not present

## 2015-05-31 DIAGNOSIS — I213 ST elevation (STEMI) myocardial infarction of unspecified site: Secondary | ICD-10-CM | POA: Diagnosis not present

## 2015-05-31 DIAGNOSIS — I513 Intracardiac thrombosis, not elsewhere classified: Secondary | ICD-10-CM

## 2015-05-31 LAB — POCT INR: INR: 1.3

## 2015-06-24 ENCOUNTER — Other Ambulatory Visit: Payer: Self-pay | Admitting: Cardiology

## 2015-06-24 ENCOUNTER — Telehealth: Payer: Self-pay | Admitting: Cardiology

## 2015-06-24 ENCOUNTER — Other Ambulatory Visit: Payer: Self-pay | Admitting: Physician Assistant

## 2015-06-24 DIAGNOSIS — I472 Ventricular tachycardia, unspecified: Secondary | ICD-10-CM

## 2015-06-24 MED ORDER — POTASSIUM CHLORIDE CRYS ER 20 MEQ PO TBCR: 40.0000 meq | EXTENDED_RELEASE_TABLET | Freq: Every day | ORAL | Status: DC

## 2015-06-24 NOTE — Telephone Encounter (Signed)
Called to make sure pt's pharmacy received Rx- they did.  Corine ShelterLUKE Dreyah Montrose PA-C 06/24/2015 5:18 PM

## 2015-06-24 NOTE — Telephone Encounter (Signed)
Pt called saying she has been out of potasium for 3 weeks and asked for a re order which I did.  Corine ShelterLUKE Deshanta Lady PA-C 06/24/2015 5:13 PM

## 2015-08-02 ENCOUNTER — Other Ambulatory Visit (HOSPITAL_COMMUNITY): Payer: Self-pay | Admitting: Internal Medicine

## 2015-08-02 ENCOUNTER — Other Ambulatory Visit: Payer: Self-pay | Admitting: Physician Assistant

## 2015-09-23 ENCOUNTER — Other Ambulatory Visit: Payer: Self-pay | Admitting: Cardiology

## 2015-09-23 ENCOUNTER — Other Ambulatory Visit (HOSPITAL_COMMUNITY): Payer: Self-pay | Admitting: Adult Health

## 2015-09-27 ENCOUNTER — Telehealth: Payer: Self-pay | Admitting: *Deleted

## 2015-09-27 NOTE — Telephone Encounter (Signed)
Called pt because she has not been seen in the [facility] in months.  Pt stated she monitors her own Coumadin, explained to pt that a medical professional has to follow her monitor her Coumadin & dose INR's as they are checked.  She stated she knew that but did not have time to come in.  Pt stated that she will come into the office on tomorrow after her appt with CHF clinic.

## 2015-09-28 ENCOUNTER — Ambulatory Visit (INDEPENDENT_AMBULATORY_CARE_PROVIDER_SITE_OTHER): Payer: Medicare Other | Admitting: *Deleted

## 2015-09-28 ENCOUNTER — Ambulatory Visit (HOSPITAL_COMMUNITY)
Admission: RE | Admit: 2015-09-28 | Discharge: 2015-09-28 | Disposition: A | Discharge status: Home / Self Care | Payer: Medicare Other | Source: Ambulatory Visit | Attending: Cardiology | Admitting: Cardiology

## 2015-09-28 ENCOUNTER — Encounter (HOSPITAL_COMMUNITY): Payer: Self-pay

## 2015-09-28 VITALS — BP 96/62 | HR 82 | Wt 132.5 lb

## 2015-09-28 DIAGNOSIS — I213 ST elevation (STEMI) myocardial infarction of unspecified site: Secondary | ICD-10-CM

## 2015-09-28 DIAGNOSIS — I5022 Chronic systolic (congestive) heart failure: Secondary | ICD-10-CM | POA: Diagnosis present

## 2015-09-28 DIAGNOSIS — Z9119 Patient's noncompliance with other medical treatment and regimen: Secondary | ICD-10-CM | POA: Insufficient documentation

## 2015-09-28 DIAGNOSIS — Z7901 Long term (current) use of anticoagulants: Secondary | ICD-10-CM | POA: Insufficient documentation

## 2015-09-28 DIAGNOSIS — Z5181 Encounter for therapeutic drug level monitoring: Secondary | ICD-10-CM

## 2015-09-28 DIAGNOSIS — F419 Anxiety disorder, unspecified: Secondary | ICD-10-CM | POA: Diagnosis not present

## 2015-09-28 DIAGNOSIS — I130 Hypertensive heart and chronic kidney disease with heart failure and stage 1 through stage 4 chronic kidney disease, or unspecified chronic kidney disease: Secondary | ICD-10-CM | POA: Insufficient documentation

## 2015-09-28 DIAGNOSIS — Z86718 Personal history of other venous thrombosis and embolism: Secondary | ICD-10-CM | POA: Insufficient documentation

## 2015-09-28 DIAGNOSIS — J449 Chronic obstructive pulmonary disease, unspecified: Secondary | ICD-10-CM | POA: Insufficient documentation

## 2015-09-28 DIAGNOSIS — K219 Gastro-esophageal reflux disease without esophagitis: Secondary | ICD-10-CM | POA: Diagnosis not present

## 2015-09-28 DIAGNOSIS — I2510 Atherosclerotic heart disease of native coronary artery without angina pectoris: Secondary | ICD-10-CM | POA: Insufficient documentation

## 2015-09-28 DIAGNOSIS — I255 Ischemic cardiomyopathy: Secondary | ICD-10-CM | POA: Insufficient documentation

## 2015-09-28 DIAGNOSIS — F17210 Nicotine dependence, cigarettes, uncomplicated: Secondary | ICD-10-CM | POA: Diagnosis not present

## 2015-09-28 DIAGNOSIS — I252 Old myocardial infarction: Secondary | ICD-10-CM | POA: Insufficient documentation

## 2015-09-28 DIAGNOSIS — I513 Intracardiac thrombosis, not elsewhere classified: Secondary | ICD-10-CM

## 2015-09-28 DIAGNOSIS — E785 Hyperlipidemia, unspecified: Secondary | ICD-10-CM | POA: Diagnosis not present

## 2015-09-28 DIAGNOSIS — I739 Peripheral vascular disease, unspecified: Secondary | ICD-10-CM

## 2015-09-28 DIAGNOSIS — I272 Other secondary pulmonary hypertension: Secondary | ICD-10-CM | POA: Insufficient documentation

## 2015-09-28 DIAGNOSIS — G4730 Sleep apnea, unspecified: Secondary | ICD-10-CM | POA: Insufficient documentation

## 2015-09-28 LAB — BASIC METABOLIC PANEL
ANION GAP: 10 (ref 5–15)
BUN: 34 mg/dL — ABNORMAL HIGH (ref 6–20)
CHLORIDE: 100 mmol/L — AB (ref 101–111)
CO2: 27 mmol/L (ref 22–32)
Calcium: 9.3 mg/dL (ref 8.9–10.3)
Creatinine, Ser: 1.32 mg/dL — ABNORMAL HIGH (ref 0.44–1.00)
GFR calc non Af Amer: 44 mL/min — ABNORMAL LOW (ref >60)
GFR, EST AFRICAN AMERICAN: 51 mL/min — AB (ref >60)
Glucose, Bld: 136 mg/dL — ABNORMAL HIGH (ref 65–99)
Potassium: 3.2 mmol/L — ABNORMAL LOW (ref 3.5–5.1)
Sodium: 137 mmol/L (ref 135–145)

## 2015-09-28 LAB — POCT INR: INR: 2.4

## 2015-09-28 LAB — BRAIN NATRIURETIC PEPTIDE: B NATRIURETIC PEPTIDE 5: 871.3 pg/mL — AB (ref 0.0–100.0)

## 2015-09-28 LAB — MAGNESIUM: Magnesium: 1.5 mg/dL — ABNORMAL LOW (ref 1.7–2.4)

## 2015-09-28 MED ORDER — POTASSIUM CHLORIDE CRYS ER 20 MEQ PO TBCR: EXTENDED_RELEASE_TABLET | ORAL | Status: DC

## 2015-09-28 MED ORDER — TORSEMIDE 20 MG PO TABS: 60.0000 mg | ORAL_TABLET | Freq: Two times a day (BID) | ORAL | Status: DC

## 2015-09-28 MED ORDER — WARFARIN SODIUM 5 MG PO TABS: ORAL_TABLET | ORAL | Status: DC

## 2015-09-28 MED ORDER — METOLAZONE 2.5 MG PO TABS: ORAL_TABLET | ORAL | Status: DC

## 2015-09-28 NOTE — Progress Notes (Signed)
Patient ID: Jessica [name], female   DOB: 14-Jan-1961, 55 y.o.   MRN: [id number]     Advanced Heart Failure Clinic Note   EP: Dr Ladona Ridgel HF: Dr. Gala Romney   HPI: Jessica [name] is a 55 y.o. with a history of ICM, chronic systolic heart failure EF 25%,  CAD s/p multiple interventions, ICD Biotronic, DM, hyperlipidemia, smoker, S/P right iliofemoral embolectomy and compartment fasciotomy of the right leg by Dr. Darrick Penna with LLE DVT 06/2010.  S/P ICD discharge 03/13/13.    She has seen Dr. Kendrick Fries as part of her [facility] workup.  PFTs were done in 7/15 suggesting an interstitial process (restrictive) with markedly decreased DLCO): FVC 74%%, FEV1 81%, ratio 107%, TLC 76%, DLCO 29%. CTA chest in 4/15 showed no PE but did have focal honeycomb changes in the lung bases.   Admitted  11/3-11/5/16 for recurrent HF. Diuresed from 140 -> 133.   HF follow-up:  She returns today for add on.  Weight is up at home, feeling more SOB over about a month. Also has increased swelling in ankles. Weight up from 124 to 130. Taking torsemide 40 bid and metolazone up to 3 times a week. Also c/p of mild abdominal distention. + orthopnea and PND. SOB walking from parking garage to elevator, but not from reception to patient room. Occasionally has pain in feet. (chronic)  Not as bad as it has been.  Occasional has chest heaviness when she is volume overloaded and very SOB, only happened 1-2 times this past month. Smokes a "cigar" every 1-2 days.    RHC 07/23/2013: RA 15, RV 78/5/18, PA 79/31 (47), PCWP 12, Fick CO/CI 2.1/1.3, Thermo CO/CI 3.4/2.0, PVR 16.5, PA 39% and 40%  ECHO 02/22/11: Only basal function preserved. EF 20-25%. ECHO 12/20/2011 EF 15%  ECHO 6/15 EF 25% with diffuse hypokinesis, severe LV dilation, mild MR, RV mildly dilated with moderately decreased systolic function.  ECHO 03/25/14 EF 20-25%. Moderate RV dysfunction + apical clot  6 minute walk (7/15): 384 m 6 MW (01/28/14): 1100 ft (32m), O2 sats ranged from 98-92% and  HR ranged from 85-120  ECG: NSR, PVCs, left axis deviation  Labs: 03/13/13 K 3.8 Creatinine 0.90 03/17/13 K 4.7 Creatinine 0.94  Magnesium 1.6 Started on magnesium 4/15 K 3.7 => 4.6, Creatinine 0.89 => 1.23 Pro BNP 1848  07/14/2013: K 4.1, creatinine 0.88, digoxin 1.0, Pro BNP 2130 08/05/13 Dig 0.5  6/15 K 3.8, creatinine 1.2, digoxin 0.5, HCT 43.6 09/30/13 HIV NR, ANA negative 2/16 digoxin 0.7, K 4, creatinine 1.01, HCT 42.1  ROS: All systems negative except as listed in HPI, PMH and Problem List.  SH: smokes cigar daily, Drinks occasional beer; lives with boyfriend and 2 children in [place].   FH: Mom living: DM2, HTN, CKD  Father deceased: MI, CAD, HTN   Past Medical History  Diagnosis Date  . Chronic systolic heart failure (HCC)     a.  secondary to severe ischemic cardiomyopathy with EF 20-25%;  b. s/p rimplantation of Biotronik single chamber ICD by Dr Ladona Ridgel 02/2008;  c.  RHC 07/2013 showed well compensated left sided pressures with moderate PAH and low cardiac output. d. 2D Echo 03/2014: EF 20-25%, grade 2 DD, PAP .  [name] CAD (coronary artery disease)     a. 1996: anterior wall MI tx with PTCA. b. 2003: PCI/DES to circumflex marginal. c. 2004: PTCA/CBA/stenting of ostium of D1 and LAD. d. 06/2003: s/p PTCA/DES of mRCA. e. 11/2003: CBA of bifurcation diagonal LAD. f. 2007: PTCA/DES to  RCA, CBA/stenting to OM2.  . Tobacco abuse   . Alcohol abuse   . History of noncompliance with medical treatment   . Diabetes mellitus, type 2 (HCC)   . Dizziness   . HLD (hyperlipidemia)   . DVT (deep venous thrombosis) (HCC)     a. 06/2010.  . Arterial embolism (HCC)     a. H/o RLE extremity ischemia in 2012 s/p right iliofemoral embolectomy with compartment fasciotomy.  . V-tach (HCC)   . AICD (automatic cardioverter/defibrillator) present     a. Biotronik placed 2009. b. ICD discharge 2011, 03/2013.  [name]. Asthma   . Sleep apnea   . GERD (gastroesophageal reflux disease)   . Anxiety   .  Myocardial infarction (HCC)   . COPD (chronic obstructive pulmonary disease) (HCC)   . LV (left ventricular) mural thrombus (HCC)     a. Dx on echo 03/2014.  . Pulmonary hypertension (HCC)   . CKD (chronic kidney disease), stage II     Current Outpatient Prescriptions  Medication Sig Dispense Refill  . albuterol (PROVENTIL HFA;VENTOLIN HFA) 108 (90 BASE) MCG/ACT inhaler Inhale 2 puffs into the lungs every 6 (six) hours as needed for wheezing or shortness of breath. 1 Inhaler 2  . albuterol (PROVENTIL) (2.5 MG/3ML) 0.083% nebulizer solution Take 2.5 mg by nebulization every 6 (six) hours as needed for wheezing or shortness of breath.    . digoxin (LANOXIN) 0.25 MG tablet Take 0.5 tablets (0.125 mg total) by mouth daily. 30 tablet 5  . KLOR-CON M20 20 MEQ tablet TAKE 2 TABLETS (40 MEQ TOTAL) BY MOUTH DAILY. 60 tablet 1  . magnesium oxide (MAG-OX) 400 (241.3 Mg) MG tablet TAKE 1/2 TABLET (200MG ) EVERY DAY 15 tablet 3  . metolazone (ZAROXOLYN) 2.5 MG tablet TAKE 1 TABLET (2.5MG ) EVERY WEDNESDAY. TAKE AN EXTRA 20MEQ DOSE OF POTASSIUM WITH THIS. 30 tablet 1  . pravastatin (PRAVACHOL) 40 MG tablet Take 1 tablet (40 mg total) by mouth every evening. 90 tablet 3  . sildenafil (REVATIO) 20 MG tablet Take 1 tablet (20 mg total) by mouth 3 (three) times daily. 180 tablet 6  . spironolactone (ALDACTONE) 25 MG tablet TAKE 1 TABLET (25 MG TOTAL) BY MOUTH DAILY. 30 tablet 3  . torsemide (DEMADEX) 20 MG tablet Take 2 tablets (40 mg total) by mouth 2 (two) times daily. 120 tablet 1  . warfarin (COUMADIN) 5 MG tablet Take As Directed by Coumadin Clinic 30 tablet 1   No current facility-administered medications for this encounter.      Filed Vitals:   09/28/15 1029  BP: 96/62  Pulse: 82  Weight: 132 lb 8 oz (60.102 kg)  SpO2: 92%   Wt Readings from Last 3 Encounters:  09/28/15 132 lb 8 oz (60.102 kg)  03/03/15 136 lb (61.689 kg)  01/20/15 131 lb (59.421 kg)    PHYSICAL EXAM: General:  NAD ,  smells strongly of smoke HEENT:  Normal Neck: supple. JVP 7-8 with mild HJR.  Carotids 2+ bilaterally; no bruits. No thyromegaly or nodule Cor: PMI laterally displaced. RRR; wide split S2, soft S3 Lungs: CTAB,normal effort Abdomen: obese, soft, non-tender, non-distended, no HSM. No bruits or masses. +BS Extremities: no cyanosis, clubbing, rash.  Trace ankle edema. Absent bilateral pedal pulses  Neuro: alert & orientedx3, cranial nerves grossly intact. Moves all 4 extremities w/o difficulty. Affect pleasant.  ASSESSMENT & PLAN: 1. Chronic systolic heart failure: Ischemic cardiomyopathy.  Echo (1/16) with EF 20-25% with mod RV dysfunction  S/P Biotronic ICD 2009.  NYHA III.   - Volume status mildly elevated. Took metolazone yesterday with good relief.  - Increase torsemide to 60 mg BID. BMET, BNP, and Mg today.  - Has been using metolazone up to three times a week. Hope that increase torsemide will help lessen this need. Take extra 20 meq of potassium on metolazone days.   - Increase KCl to 40 qam/20 qpm.  - Intolerant to BBs with hypotension even at low dose.  ACEI likely will be limited by same issue.   - Continue spironolactone 25 mg daily  - Continue digoxin 0.25 mg daily, last digoxin level 0.8 - She is not interested in LVAD but would be open to the possibility of a cardiac transplantation but not good candidate due to noncompliance and tobacco use. - Will schedule repeat Echo.  2.  CAD: S/P multiple interventions. No chest pain.  - Continue ASA 81 - Stopped zocor with cost and muscle aches.  Continue pravastatin 40 mg daily. Check Lipids/LFTs at next visit.  - Back to smoking cigars.  3.  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   - Continue sildenafil -  No NTG with Revatio.  - No indication for CPAP on sleep study.  - Based on CT and PFTs, concern for interstitial lung disease. Follows with pulmonary.  - Serologic workup: ANA negative,  anti-SCL70, RF, HIV NR   - 10/2013  V/Q scan--> normal   - Continues to smoke. Encouraged her to quit.  She states she smoke seldomly but smells very strongly of cigarettes.  4. LV thrombus: On coumadin. Non-compliant with INR checks at [facility] - Stressed importance of regular follow up in coumadin clinic. To go after we see her today.  5. Ongoing pain in feet. - ABIs ordered x 2 but has not gone.  Will reschedule.   BMET, BNP, and Mg today. Increase torsemide to hope to lessen use of metolazone.  Reschedule ABIs. RTC 4-6 weeks for MD visit with Echo.   Mariam Dollar Tillery PA-C 10:45 AM   Total time spent > 25 minutes

## 2015-09-28 NOTE — Patient Instructions (Signed)
INCREASE Metolazone to 3 times weekly AS NEEDED for weight gain/swelling. Take EXTRA potassium tab with this.  INCREASE Torsemide to 60 mg (3 tabs) twice daily.  INCREASE Potassium to 40 meq (2 tabs) in am and 20 meq (1 tab) in pm.  Routine lab work today. Will notify you of abnormal results, otherwise no news is good news!  Return in 2-4 weeks with Dr. Gala RomneyBensimhon, echocardiogram, and ABI study.  Do the following things EVERYDAY: 1) Weigh yourself in the morning before breakfast. Write it down and keep it in a log. 2) Take your medicines as prescribed 3) Eat low salt foods-Limit salt (sodium) to 2000 mg per day.  4) Stay as active as you can everyday 5) Limit all fluids for the day to less than 2 liters

## 2015-10-06 ENCOUNTER — Ambulatory Visit (INDEPENDENT_AMBULATORY_CARE_PROVIDER_SITE_OTHER): Payer: Medicare Other | Admitting: *Deleted

## 2015-10-06 DIAGNOSIS — I513 Intracardiac thrombosis, not elsewhere classified: Secondary | ICD-10-CM

## 2015-10-06 DIAGNOSIS — Z5181 Encounter for therapeutic drug level monitoring: Secondary | ICD-10-CM

## 2015-10-06 DIAGNOSIS — I213 ST elevation (STEMI) myocardial infarction of unspecified site: Secondary | ICD-10-CM

## 2015-10-06 LAB — POCT INR: INR: 5.4

## 2015-10-07 ENCOUNTER — Other Ambulatory Visit (HOSPITAL_COMMUNITY): Payer: Medicare Other

## 2015-10-27 ENCOUNTER — Ambulatory Visit (HOSPITAL_COMMUNITY)
Admission: RE | Admit: 2015-10-27 | Discharge: 2015-10-27 | Disposition: A | Discharge status: Home / Self Care | Payer: Medicare Other | Source: Ambulatory Visit | Attending: Cardiology | Admitting: Cardiology

## 2015-10-27 ENCOUNTER — Ambulatory Visit (HOSPITAL_BASED_OUTPATIENT_CLINIC_OR_DEPARTMENT_OTHER)
Admission: RE | Admit: 2015-10-27 | Discharge: 2015-10-27 | Disposition: A | Discharge status: Home / Self Care | Payer: Medicare Other | Source: Ambulatory Visit

## 2015-10-27 DIAGNOSIS — I081 Rheumatic disorders of both mitral and tricuspid valves: Secondary | ICD-10-CM | POA: Diagnosis not present

## 2015-10-27 DIAGNOSIS — I739 Peripheral vascular disease, unspecified: Secondary | ICD-10-CM | POA: Diagnosis not present

## 2015-10-27 DIAGNOSIS — I5022 Chronic systolic (congestive) heart failure: Secondary | ICD-10-CM | POA: Diagnosis not present

## 2015-10-27 DIAGNOSIS — I509 Heart failure, unspecified: Secondary | ICD-10-CM | POA: Diagnosis not present

## 2015-10-27 MED ORDER — PERFLUTREN LIPID MICROSPHERE
1.0000 mL | INTRAVENOUS | Status: AC | PRN
  Administered 2015-10-27: 2 mL via INTRAVENOUS
  Filled 2015-10-27: qty 10

## 2015-10-27 MED ORDER — PERFLUTREN LIPID MICROSPHERE
INTRAVENOUS | Status: AC
  Filled 2015-10-27: qty 10

## 2015-10-27 NOTE — Progress Notes (Signed)
  Echocardiogram 2D Echocardiogram has been performed.  Janalyn HarderWest, Drinda Belgard R 10/27/2015, 12:53 PM

## 2015-10-27 NOTE — Progress Notes (Addendum)
VASCULAR LAB PRELIMINARY  ARTERIAL  ABI completed:    RIGHT    LEFT    PRESSURE WAVEFORM  PRESSURE WAVEFORM  BRACHIAL 118 Triphasic BRACHIAL 114 Triphasic  DP 118 Biphasic DP 82 Dampened monophasic  AT   AT    PT 114 Triphasic PT 82 Dampened monophasic  PER   PER    GREAT TOE  NA GREAT TOE  NA    RIGHT LEFT  ABI 1.00 0.69   The right ABI is within normal limits at rest. The left ABI is suggestive of moderate arterial insufficiency at rest.  Advised patient of vascular lab protocol which includes lower extremity arterial duplex in the presence of abnormal ABIs. Patient refused this exam at this time, stating "I can't lie flat for that long."   10/27/2015 10:51 AM Gertie FeyMichelle Haedyn Breau, BS, RVT, RDCS, RDMS

## 2015-11-03 ENCOUNTER — Ambulatory Visit (HOSPITAL_COMMUNITY)
Admission: RE | Admit: 2015-11-03 | Discharge: 2015-11-03 | Disposition: A | Discharge status: Home / Self Care | Payer: Medicare Other | Source: Ambulatory Visit | Attending: Internal Medicine | Admitting: Internal Medicine

## 2015-11-03 VITALS — BP 94/60 | HR 83 | Wt 130.2 lb

## 2015-11-03 DIAGNOSIS — Z86718 Personal history of other venous thrombosis and embolism: Secondary | ICD-10-CM | POA: Diagnosis not present

## 2015-11-03 DIAGNOSIS — Z79899 Other long term (current) drug therapy: Secondary | ICD-10-CM | POA: Insufficient documentation

## 2015-11-03 DIAGNOSIS — E785 Hyperlipidemia, unspecified: Secondary | ICD-10-CM | POA: Insufficient documentation

## 2015-11-03 DIAGNOSIS — Z7901 Long term (current) use of anticoagulants: Secondary | ICD-10-CM | POA: Diagnosis not present

## 2015-11-03 DIAGNOSIS — Z95810 Presence of automatic (implantable) cardiac defibrillator: Secondary | ICD-10-CM | POA: Insufficient documentation

## 2015-11-03 DIAGNOSIS — I255 Ischemic cardiomyopathy: Secondary | ICD-10-CM | POA: Diagnosis not present

## 2015-11-03 DIAGNOSIS — R590 Localized enlarged lymph nodes: Secondary | ICD-10-CM | POA: Insufficient documentation

## 2015-11-03 DIAGNOSIS — I739 Peripheral vascular disease, unspecified: Secondary | ICD-10-CM

## 2015-11-03 DIAGNOSIS — K219 Gastro-esophageal reflux disease without esophagitis: Secondary | ICD-10-CM | POA: Diagnosis not present

## 2015-11-03 DIAGNOSIS — Z833 Family history of diabetes mellitus: Secondary | ICD-10-CM | POA: Insufficient documentation

## 2015-11-03 DIAGNOSIS — J449 Chronic obstructive pulmonary disease, unspecified: Secondary | ICD-10-CM | POA: Diagnosis not present

## 2015-11-03 DIAGNOSIS — I252 Old myocardial infarction: Secondary | ICD-10-CM | POA: Insufficient documentation

## 2015-11-03 DIAGNOSIS — Z8249 Family history of ischemic heart disease and other diseases of the circulatory system: Secondary | ICD-10-CM | POA: Insufficient documentation

## 2015-11-03 DIAGNOSIS — Z9119 Patient's noncompliance with other medical treatment and regimen: Secondary | ICD-10-CM | POA: Diagnosis not present

## 2015-11-03 DIAGNOSIS — I272 Other secondary pulmonary hypertension: Secondary | ICD-10-CM | POA: Insufficient documentation

## 2015-11-03 DIAGNOSIS — I2583 Coronary atherosclerosis due to lipid rich plaque: Secondary | ICD-10-CM

## 2015-11-03 DIAGNOSIS — Z955 Presence of coronary angioplasty implant and graft: Secondary | ICD-10-CM | POA: Diagnosis not present

## 2015-11-03 DIAGNOSIS — I5022 Chronic systolic (congestive) heart failure: Secondary | ICD-10-CM | POA: Diagnosis not present

## 2015-11-03 DIAGNOSIS — F17290 Nicotine dependence, other tobacco product, uncomplicated: Secondary | ICD-10-CM | POA: Diagnosis not present

## 2015-11-03 DIAGNOSIS — N182 Chronic kidney disease, stage 2 (mild): Secondary | ICD-10-CM | POA: Insufficient documentation

## 2015-11-03 DIAGNOSIS — I2510 Atherosclerotic heart disease of native coronary artery without angina pectoris: Secondary | ICD-10-CM | POA: Insufficient documentation

## 2015-11-03 DIAGNOSIS — Z7982 Long term (current) use of aspirin: Secondary | ICD-10-CM | POA: Diagnosis not present

## 2015-11-03 DIAGNOSIS — E1122 Type 2 diabetes mellitus with diabetic chronic kidney disease: Secondary | ICD-10-CM | POA: Diagnosis not present

## 2015-11-03 DIAGNOSIS — G4730 Sleep apnea, unspecified: Secondary | ICD-10-CM | POA: Diagnosis not present

## 2015-11-03 LAB — BASIC METABOLIC PANEL
ANION GAP: 10 (ref 5–15)
BUN: 41 mg/dL — ABNORMAL HIGH (ref 6–20)
CALCIUM: 9.5 mg/dL (ref 8.9–10.3)
CO2: 28 mmol/L (ref 22–32)
Chloride: 96 mmol/L — ABNORMAL LOW (ref 101–111)
Creatinine, Ser: 1.58 mg/dL — ABNORMAL HIGH (ref 0.44–1.00)
GFR calc Af Amer: 41 mL/min — ABNORMAL LOW (ref >60)
GFR, EST NON AFRICAN AMERICAN: 35 mL/min — AB (ref >60)
GLUCOSE: 251 mg/dL — AB (ref 65–99)
Potassium: 4 mmol/L (ref 3.5–5.1)
SODIUM: 134 mmol/L — AB (ref 135–145)

## 2015-11-03 MED ORDER — SILDENAFIL CITRATE 20 MG PO TABS: 40.0000 mg | ORAL_TABLET | Freq: Three times a day (TID) | ORAL | 6 refills | Status: DC

## 2015-11-03 NOTE — Patient Instructions (Signed)
INCREASE REVITAO TO 40 MG (2 TABS) THREE TIMES PER DAY CONTINUE TORSEMIDE 40 MG (2 TABS) TWICE ADAY  LABS TODAY  [facility] further evaluation of peripheral arterial diease.  Your physician recommends that you schedule a follow-up appointment in: 3 months with Dr Gala RomneyBensimhon

## 2015-11-03 NOTE — Progress Notes (Signed)
Patient ID: Jessica [name], female   DOB: March 27, 1960, 55 y.o.   MRN: [id number]     Advanced Heart Failure Clinic Note   EP: Dr Ladona Ridgel HF: Dr. Gala Romney   HPI: Jessica [name] is a 55 y.o. with a history of ICM, chronic systolic heart failure EF 25%,  CAD s/p multiple interventions, ICD Biotronic, DM, hyperlipidemia, smoker, S/P right iliofemoral embolectomy and compartment fasciotomy of the right leg by Dr. Darrick Penna with LLE DVT 06/2010.  S/P ICD discharge 03/13/13.    She has seen Dr. Kendrick Fries as part of her [facility] workup.  PFTs were done in 7/15 suggesting an interstitial process (restrictive) with markedly decreased DLCO): FVC 74%%, FEV1 81%, ratio 107%, TLC 76%, DLCO 29%. CTA chest in 4/15 showed no PE but did have focal honeycomb changes in the lung bases.   Admitted  11/3-11/5/16 for recurrent HF. Diuresed from 140 -> 133.   HF follow-up:  She returns today for routine f/u. Last month seen as an add-on for volume overload. Torsemide increased to 60 bid. She has only been taking 40 bid but it has worked. Rarely takes metolazone. Weight back down to 124-126 (baseline at home). No CP. Mild DOE. Continues with pain in feet. (chronic)  + claudication. Had ABIs. Normal on R. Moderate insufficiency on left. Ultrasound done but results unavailable. Smokes a "cigar" every 1-2 days.    RHC 07/23/2013: RA 15, RV 78/5/18, PA 79/31 (47), PCWP 12, Fick CO/CI 2.1/1.3, Thermo CO/CI 3.4/2.0, PVR 16.5, PA 39% and 40%  ECHO 02/22/11: Only basal function preserved. EF 20-25%. ECHO 12/20/2011 EF 15%  ECHO 6/15 EF 25% with diffuse hypokinesis, severe LV dilation, mild MR, RV mildly dilated with moderately decreased systolic function.  ECHO 03/25/14 EF 20-25%. Moderate RV dysfunction + apical clot ECHO 8/17: EF 20-25% RVSP 80   6 minute walk (7/15): 384 m 6 MW (01/28/14): 1100 ft (347m), O2 sats ranged from 98-92% and HR ranged from 85-120  ECG: NSR, PVCs, left axis deviation  Labs: 03/13/13 K 3.8 Creatinine 0.90 03/17/13  K 4.7 Creatinine 0.94  Magnesium 1.6 Started on magnesium 4/15 K 3.7 => 4.6, Creatinine 0.89 => 1.23 Pro BNP 1848  07/14/2013: K 4.1, creatinine 0.88, digoxin 1.0, Pro BNP 2130 08/05/13 Dig 0.5  6/15 K 3.8, creatinine 1.2, digoxin 0.5, HCT 43.6 09/30/13 HIV NR, ANA negative 2/16 digoxin 0.7, K 4, creatinine 1.01, HCT 42.1  ROS: All systems negative except as listed in HPI, PMH and Problem List.  SH: smokes cigar daily, Drinks occasional beer; lives with boyfriend and 2 children in [place].   FH: Mom living: DM2, HTN, CKD  Father deceased: MI, CAD, HTN   Past Medical History:  Diagnosis Date  . AICD (automatic cardioverter/defibrillator) present    a. Biotronik placed 2009. b. ICD discharge 2011, 03/2013.  [name] Alcohol abuse   . Anxiety   . Arterial embolism (HCC)    a. H/o RLE extremity ischemia in 2012 s/p right iliofemoral embolectomy with compartment fasciotomy.  . Asthma   . CAD (coronary artery disease)    a. 1996: anterior wall MI tx with PTCA. b. 2003: PCI/DES to circumflex marginal. c. 2004: PTCA/CBA/stenting of ostium of D1 and LAD. d. 06/2003: s/p PTCA/DES of mRCA. e. 11/2003: CBA of bifurcation diagonal LAD. f. 2007: PTCA/DES to RCA, CBA/stenting to OM2.  . Chronic systolic heart failure (HCC)    a.  secondary to severe ischemic cardiomyopathy with EF 20-25%;  b. s/p rimplantation of Biotronik single chamber ICD by Dr Ladona Ridgel  02/2008;  c.  RHC 07/2013 showed well compensated left sided pressures with moderate PAH and low cardiac output. d. 2D Echo 03/2014: EF 20-25%, grade 2 DD, PAP 58mmHg.  . CKD (chronic kidney disease), stage II   . COPD (chronic obstructive pulmonary disease) (HCC)   . Diabetes mellitus, type 2 (HCC)   . Dizziness   . DVT (deep venous thrombosis) (HCC)    a. 06/2010.  [name]. GERD (gastroesophageal reflux disease)   . History of noncompliance with medical treatment   . HLD (hyperlipidemia)   . LV (left ventricular) mural thrombus (HCC)    a. Dx on echo 03/2014.  [name].  Myocardial infarction (HCC)   . Pulmonary hypertension (HCC)   . Sleep apnea   . Tobacco abuse   . V-tach [facility](HCC)     Current Outpatient Prescriptions  Medication Sig Dispense Refill  . albuterol (PROVENTIL HFA;VENTOLIN HFA) 108 (90 BASE) MCG/ACT inhaler Inhale 2 puffs into the lungs every 6 (six) hours as needed for wheezing or shortness of breath. 1 Inhaler 2  . albuterol (PROVENTIL) (2.5 MG/3ML) 0.083% nebulizer solution Take 2.5 mg by nebulization every 6 (six) hours as needed for wheezing or shortness of breath.    . digoxin (LANOXIN) 0.25 MG tablet Take 0.5 tablets (0.125 mg total) by mouth daily. 30 tablet 5  . magnesium oxide (MAG-OX) 400 (241.3 Mg) MG tablet TAKE 1/2 TABLET (200MG ) EVERY DAY 15 tablet 3  . metolazone (ZAROXOLYN) 2.5 MG tablet Take once daily as needed (3 x max per week) for weight gain/swelling. Take extra 20 meq potassium pill with this. 15 tablet 6  . potassium chloride SA (KLOR-CON M20) 20 MEQ tablet Take 40 mg (2 tabs) in am and 20 meq (1 tab) in pm 90 tablet 6  . pravastatin (PRAVACHOL) 40 MG tablet Take 1 tablet (40 mg total) by mouth every evening. 90 tablet 3  . sildenafil (REVATIO) 20 MG tablet Take 1 tablet (20 mg total) by mouth 3 (three) times daily. 180 tablet 6  . spironolactone (ALDACTONE) 25 MG tablet TAKE 1 TABLET (25 MG TOTAL) BY MOUTH DAILY. 30 tablet 3  . torsemide (DEMADEX) 20 MG tablet Take 40 mg by mouth 2 (two) times daily.    [name]. warfarin (COUMADIN) 5 MG tablet Take As Directed by Coumadin Clinic 30 tablet 0   No current facility-administered medications for this encounter.       Vitals:   11/03/15 1455  BP: 94/60  BP Location: Right Arm  Patient Position: Sitting  Cuff Size: Normal  Pulse: 83  SpO2: 98%  Weight: 130 lb 4 oz (59.1 kg)   Wt Readings from Last 3 Encounters:  11/03/15 130 lb 4 oz (59.1 kg)  09/28/15 132 lb 8 oz (60.1 kg)  03/03/15 136 lb (61.7 kg)    PHYSICAL EXAM: General:  NAD ,  HEENT:  Normal Neck: supple.  JVP 6 Carotids 2+ bilaterally; no bruits. No thyromegaly. Large tender lymph node under right ear  Cor: PMI laterally displaced. RRR; wide split S2, soft S3 Lungs: CTAB,normal effort Abdomen: obese, soft, non-tender, non-distended, no HSM. No bruits or masses. +BS Extremities: no cyanosis, clubbing, rash.  No edema Neuro: alert & orientedx3, cranial nerves grossly intact. Moves all 4 extremities w/o difficulty. Affect pleasant.  ASSESSMENT & PLAN: 1. Chronic systolic heart failure: Ischemic cardiomyopathy.  Echo (1/16) with EF 20-25% with mod RV dysfunction  S/P Biotronic ICD 2009.  NYHA III.   - Volume status looks good.   -  Continue torsemide 40 bid. Labs today. - Continue metolazone as needed.. Take extra 20 meq of potassium on metolazone days.   - Intolerant to BBs with hypotension even at low dose.  ACEI limited by same issue.   - Continue spironolactone 25 mg daily  - Continue digoxin 0.25 mg daily, last digoxin level 0.8 - She is not interested in LVAD but would be open to the possibility of a cardiac transplantation but not good candidate due to noncompliance and tobacco use..  2.  CAD: S/P multiple interventions. No chest pain.  - Continue ASA 81 - Stopped zocor with cost and muscle aches.  Continue pravastatin 40 mg daily. Check Lipids/LFTs at next visit.  - Back to smoking cigars.  3.  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  Echo reviewed personally today with PAP in 80s - Increase sildenafil to 40 TID - No NTG with Revatio.  - No indication for CPAP on sleep study.  - Based on CT and PFTs, concern for interstitial lung disease. Follows with pulmonary.  - Serologic workup: ANA negative, anti-SCL70, RF, HIV NR   - 10/2013  V/Q scan--> normal   - Continues to smoke. Encouraged her to quit.  She states she smoke seldomly but smells very strongly of cigarettes.  4. LV thrombus: On coumadin. Non-compliant with INR checks at [facility] - Stressed  importance of regular follow up in coumadin clinic. To go after we see her today.  5. Ongoing pain in feet. - ABIs with moderate left insufficiency. Can refer to PV as needed.  6. Cervical lymphadenopathy - suspect related to infected tooth. F/u with her dentist. If not improving will need biopsy.   Bensimhon, Daniel,MD 3:28 PM

## 2015-11-24 ENCOUNTER — Ambulatory Visit (HOSPITAL_COMMUNITY)
Admission: RE | Admit: 2015-11-24 | Discharge: 2015-11-24 | Disposition: A | Discharge status: Home / Self Care | Payer: Medicare Other | Source: Ambulatory Visit | Attending: Cardiology | Admitting: Cardiology

## 2015-11-24 DIAGNOSIS — I739 Peripheral vascular disease, unspecified: Secondary | ICD-10-CM

## 2015-11-24 DIAGNOSIS — E785 Hyperlipidemia, unspecified: Secondary | ICD-10-CM | POA: Diagnosis not present

## 2015-11-24 DIAGNOSIS — Z86718 Personal history of other venous thrombosis and embolism: Secondary | ICD-10-CM | POA: Insufficient documentation

## 2015-11-24 DIAGNOSIS — E1151 Type 2 diabetes mellitus with diabetic peripheral angiopathy without gangrene: Secondary | ICD-10-CM | POA: Diagnosis not present

## 2015-11-24 DIAGNOSIS — I2510 Atherosclerotic heart disease of native coronary artery without angina pectoris: Secondary | ICD-10-CM | POA: Insufficient documentation

## 2015-11-24 DIAGNOSIS — J449 Chronic obstructive pulmonary disease, unspecified: Secondary | ICD-10-CM | POA: Diagnosis not present

## 2015-11-24 DIAGNOSIS — Z87891 Personal history of nicotine dependence: Secondary | ICD-10-CM | POA: Diagnosis not present

## 2015-11-26 ENCOUNTER — Other Ambulatory Visit: Payer: Self-pay | Admitting: Internal Medicine

## 2015-11-27 ENCOUNTER — Other Ambulatory Visit: Payer: Self-pay | Admitting: Internal Medicine

## 2015-11-27 ENCOUNTER — Other Ambulatory Visit: Payer: Self-pay | Admitting: Pulmonary Disease

## 2015-11-29 ENCOUNTER — Telehealth (HOSPITAL_COMMUNITY): Payer: Self-pay

## 2015-11-29 ENCOUNTER — Other Ambulatory Visit (HOSPITAL_COMMUNITY): Payer: Self-pay

## 2015-11-29 DIAGNOSIS — I739 Peripheral vascular disease, unspecified: Secondary | ICD-10-CM

## 2015-11-29 NOTE — Telephone Encounter (Signed)
VAS US LOWER EXTREMITY ARTERIAL DUPLEX  Order: [id number]  Status:  Final result Visible to patient:  Yes (MyChart) Dx:  PAD (peripheral artery disease) (HCC)  Notes Recorded by Chyrl CivatteMegan G Qunisha Bryk, RN on 11/29/2015 at 12:00 PM EDT Results reviewed with patient PV referral sent with patient's permission.  Ave FilterBradley, Chapman Matteucci Genevea, RN  ------  Notes Recorded by Noralee SpaceHeather M Schub, RN on 11/29/2015 at 11:50 AM EDT Unable to reach patient.  ------  Notes Recorded by Dolores Pattyaniel R Bensimhon, MD on 11/28/2015 at 6:04 PM EDT [facility]

## 2015-12-01 ENCOUNTER — Telehealth (HOSPITAL_COMMUNITY): Payer: Self-pay | Admitting: Vascular Surgery

## 2015-12-01 NOTE — Addendum Note (Signed)
Addended by: Noralee SpaceSCHUB, Royden Bulman M on: 12/01/2015 04:31 PM   Modules accepted: Orders

## 2015-12-01 NOTE — Telephone Encounter (Signed)
Sent message to scheduling CVD  Pool to call pt to schedule w/ PV clinic

## 2015-12-20 ENCOUNTER — Ambulatory Visit (INDEPENDENT_AMBULATORY_CARE_PROVIDER_SITE_OTHER): Payer: Medicare Other | Admitting: Cardiovascular Disease

## 2015-12-20 VITALS — BP 120/70 | HR 84 | Ht 63.0 in | Wt 139.2 lb

## 2015-12-20 DIAGNOSIS — I739 Peripheral vascular disease, unspecified: Secondary | ICD-10-CM | POA: Diagnosis not present

## 2015-12-20 DIAGNOSIS — E785 Hyperlipidemia, unspecified: Secondary | ICD-10-CM | POA: Diagnosis not present

## 2015-12-20 DIAGNOSIS — I10 Essential (primary) hypertension: Secondary | ICD-10-CM

## 2015-12-20 DIAGNOSIS — I5022 Chronic systolic (congestive) heart failure: Secondary | ICD-10-CM

## 2015-12-20 DIAGNOSIS — Z720 Tobacco use: Secondary | ICD-10-CM

## 2015-12-20 NOTE — Progress Notes (Signed)
Cardiology Office Note   Date:  12/20/2015   ID:  Jessica [name], DOB 02/26/1961, MRN [id number]  PCP:  No PCP Per Patient  Cardiologist:   Lorine Bears, MD   No chief complaint on file.     History of Present Illness: Jessica [name] is a 55 y.o. female who was referred by Dr. Gala Romney for evaluation and management of PAD. She has extensive medical problems that include coronary artery disease, chronic systolic heart failure due to ischemic cardiomyopathy with ejection fraction of 25%, status post ICD placement, diabetes mellitus, hyperlipidemia, tobacco use and peripheral arterial disease. She had embolization to her lower extremities presumably due to LV thrombus in 2012. She underwent right iliofemoral embolectomy and compartment fasciotomy by Dr. Darrick Penna at that time. She also had left lower extremity DVT She reports bilateral leg pain with walking. She had noninvasive vascular evaluation in August which showed normal ABI on the right side and mildly to moderately reduced on the left side with evidence of left SFA occlusion and three-vessel runoff below the knee. In spite of her disease being worse on the left side, she actually reports that the pain is worse on the right leg affecting the foot and calf area. She describes a lot of numbness at rest and abnormal sensation. She is severely limited by her heart failure with significant exertional dyspnea. Unfortunately, she continues to smoke.    Past Medical History:  Diagnosis Date  . AICD (automatic cardioverter/defibrillator) present    a. Biotronik placed 2009. b. ICD discharge 2011, 03/2013.  [name] Alcohol abuse   . Anxiety   . Arterial embolism (HCC)    a. H/o RLE extremity ischemia in 2012 s/p right iliofemoral embolectomy with compartment fasciotomy.  . Asthma   . CAD (coronary artery disease)    a. 1996: anterior wall MI tx with PTCA. b. 2003: PCI/DES to circumflex marginal. c. 2004: PTCA/CBA/stenting of ostium of D1 and  LAD. d. 06/2003: s/p PTCA/DES of mRCA. e. 11/2003: CBA of bifurcation diagonal LAD. f. 2007: PTCA/DES to RCA, CBA/stenting to OM2.  . Chronic systolic heart failure (HCC)    a.  secondary to severe ischemic cardiomyopathy with EF 20-25%;  b. s/p rimplantation of Biotronik single chamber ICD by Dr Ladona Ridgel 02/2008;  c.  RHC 07/2013 showed well compensated left sided pressures with moderate PAH and low cardiac output. d. 2D Echo 03/2014: EF 20-25%, grade 2 DD, PAP .  . CKD (chronic kidney disease), stage II   . COPD (chronic obstructive pulmonary disease) (HCC)   . Diabetes mellitus, type 2 (HCC)   . Dizziness   . DVT (deep venous thrombosis) (HCC)    a. 06/2010.  [name] GERD (gastroesophageal reflux disease)   . History of noncompliance with medical treatment   . HLD (hyperlipidemia)   . LV (left ventricular) mural thrombus (HCC)    a. Dx on echo 03/2014.  [name] Myocardial infarction   . Pulmonary hypertension   . Sleep apnea   . Tobacco abuse   . V-tach [facility])     Past Surgical History:  Procedure Laterality Date  . CARDIAC CATHETERIZATION    . DIAGNOSTIC LAPAROSCOPY    . INSERT / REPLACE / REMOVE PACEMAKER    . RIGHT HEART CATHETERIZATION N/A 07/23/2013   Procedure: RIGHT HEART CATH;  Surgeon: Dolores Patty, MD;  Location: [facility] CATH LAB;  Service: Cardiovascular;  Laterality: N/A;  . TUBAL LIGATION       Current Outpatient Prescriptions  Medication Sig Dispense  Refill  . albuterol (PROVENTIL HFA;VENTOLIN HFA) 108 (90 BASE) MCG/ACT inhaler Inhale 2 puffs into the lungs every 6 (six) hours as needed for wheezing or shortness of breath. 1 Inhaler 2  . albuterol (PROVENTIL) (2.5 MG/3ML) 0.083% nebulizer solution Take 2.5 mg by nebulization every 6 (six) hours as needed for wheezing or shortness of breath.    . digoxin (LANOXIN) 0.25 MG tablet Take 0.5 tablets (0.125 mg total) by mouth daily. 30 tablet 5  . magnesium oxide (MAG-OX) 400 (241.3 Mg) MG tablet TAKE 1/2 TABLET (200MG ) EVERY DAY 15  tablet 3  . metolazone (ZAROXOLYN) 2.5 MG tablet Take once daily as needed (3 x max per week) for weight gain/swelling. Take extra 20 meq potassium pill with this. 15 tablet 6  . potassium chloride SA (KLOR-CON M20) 20 MEQ tablet Take 40 mg (2 tabs) in am and 20 meq (1 tab) in pm 90 tablet 6  . potassium chloride SA (KLOR-CON M20) 20 MEQ tablet Take 40 mg (2 tabs) in am and 20 meq (1 tab) in pm 90 tablet 1  . pravastatin (PRAVACHOL) 40 MG tablet Take 1 tablet (40 mg total) by mouth every evening. 90 tablet 3  . sildenafil (REVATIO) 20 MG tablet Take 2 tablets (40 mg total) by mouth 3 (three) times daily. 360 tablet 6  . spironolactone (ALDACTONE) 25 MG tablet TAKE 1 TABLET (25 MG TOTAL) BY MOUTH DAILY. 30 tablet 3  . torsemide (DEMADEX) 20 MG tablet Take 40 mg by mouth 2 (two) times daily.    [name] warfarin (COUMADIN) 5 MG tablet Take As Directed by Coumadin Clinic 30 tablet 0   No current facility-administered medications for this visit.     Allergies:   Nyquil multi-symptom [pseudoeph-doxylamine-dm-apap]; Sudafed [pseudoephedrine]; Zocor [simvastatin]; Latex; and Ramipril    Social History:  The patient  reports that she has been smoking Cigars.  She has a 5.25 pack-year smoking history. She has never used smokeless tobacco. She reports that she does not drink alcohol or use drugs.   Family History:  The patient's family history is negative for coronary artery disease.   ROS:  Please see the history of present illness.   Otherwise, review of systems are positive for none.   All other systems are reviewed and negative.    PHYSICAL EXAM: VS:  BP 120/70   Pulse 84   Ht 5\' 3"  (1.6 m)   Wt 139 lb 3.2 oz (63.1 kg)   BMI 24.66 kg/m  , BMI Body mass index is 24.66 kg/m. GEN: Well nourished, well developed, in no acute distress  HEENT: normal  Neck: Positive JVD, carotid bruits, or masses Cardiac: RRR; no rubs, or gallops,no edema . 2/6 holosystolic murmur at the left sternal  border Respiratory:  clear to auscultation bilaterally, normal work of breathing GI: soft, nontender, nondistended, + BS MS: no deformity or atrophy  Skin: warm and dry, no rash Neuro:  Strength and sensation are intact Psych: euthymic mood, full affect Vascular: Femoral pulses are normal bilaterally. Distal pulses are weak but palpable on the right side and not palpable on the left side.   EKG:  EKG is not ordered today.   Recent Labs: 01/13/2015: Hemoglobin 13.2; Platelets 172 01/20/2015: ALT 12 09/28/2015: B Natriuretic Peptide 871.3; Magnesium 1.5 11/03/2015: BUN 41; Creatinine, Ser 1.58; Potassium 4.0; Sodium 134    Lipid Panel    Component Value Date/Time   CHOL 108 02/21/2011 0632   TRIG 62 02/21/2011 0632   HDL 20 (  L) 02/21/2011 0632   CHOLHDL 5.4 02/21/2011 0632   VLDL 12 02/21/2011 0632   LDLCALC 76 02/21/2011 0632      Wt Readings from Last 3 Encounters:  12/20/15 139 lb 3.2 oz (63.1 kg)  11/03/15 130 lb 4 oz (59.1 kg)  09/28/15 132 lb 8 oz (60.1 kg)      No flowsheet data found.    ASSESSMENT AND PLAN:  1.  Peripheral arterial disease: The patient was found to have occluded left SFA with mild to moderately reduced ABI. ABI on the right side was normal with no evidence of obstructive disease. The patient has a lot of symptoms suggestive of peripheral neuropathy and she reports that her leg pain is worse on the right side. She does not seem to be limited by left calf claudication and thus there is no benefit from revascularization. Cilostazol is contraindicated due to heart failure. A small dose gabapentin can be considered for peripheral neuropathy. Continue treatment of risk factors as is being done.  2. Chronic systolic heart failure: She appears to be mildly volume overloaded and has significant symptoms related to this. Continue close follow-up in the heart failure clinic.  3. Coronary artery disease: Currently no chest pain.  4. Tobacco use: She is  down to one cigar a day but she knows that she needs to quit completely.    Disposition:   FU with me as needed.   Signed,  Lorine BearsMuhammad Adya Wirz, MD  12/20/2015 9:27 AM     [facility]

## 2015-12-20 NOTE — Patient Instructions (Signed)

## 2016-01-28 ENCOUNTER — Other Ambulatory Visit (HOSPITAL_COMMUNITY): Payer: Self-pay | Admitting: Internal Medicine

## 2016-02-14 ENCOUNTER — Other Ambulatory Visit (HOSPITAL_COMMUNITY): Payer: Self-pay | Admitting: Internal Medicine

## 2016-02-28 ENCOUNTER — Other Ambulatory Visit (HOSPITAL_COMMUNITY): Payer: Self-pay | Admitting: Adult Health

## 2016-05-23 ENCOUNTER — Emergency Department (HOSPITAL_COMMUNITY)
Admission: EM | Admit: 2016-05-23 | Discharge: 2016-05-23 | Disposition: A | Discharge status: Home / Self Care | Payer: Medicare Other | Attending: Emergency Medicine | Admitting: Emergency Medicine

## 2016-05-23 ENCOUNTER — Encounter (HOSPITAL_COMMUNITY): Payer: Self-pay | Admitting: Emergency Medicine

## 2016-05-23 ENCOUNTER — Encounter (HOSPITAL_COMMUNITY): Payer: Self-pay | Admitting: Family Medicine

## 2016-05-23 ENCOUNTER — Ambulatory Visit (HOSPITAL_COMMUNITY)
Admission: EM | Admit: 2016-05-23 | Discharge: 2016-05-23 | Disposition: A | Discharge status: Home / Self Care | Payer: Medicare Other

## 2016-05-23 DIAGNOSIS — Z7901 Long term (current) use of anticoagulants: Secondary | ICD-10-CM | POA: Insufficient documentation

## 2016-05-23 DIAGNOSIS — Z91040 Latex allergy status: Secondary | ICD-10-CM | POA: Insufficient documentation

## 2016-05-23 DIAGNOSIS — Z4801 Encounter for change or removal of surgical wound dressing: Secondary | ICD-10-CM | POA: Diagnosis present

## 2016-05-23 DIAGNOSIS — L98421 Non-pressure chronic ulcer of back limited to breakdown of skin: Secondary | ICD-10-CM | POA: Diagnosis not present

## 2016-05-23 DIAGNOSIS — F17290 Nicotine dependence, other tobacco product, uncomplicated: Secondary | ICD-10-CM | POA: Diagnosis not present

## 2016-05-23 DIAGNOSIS — Z955 Presence of coronary angioplasty implant and graft: Secondary | ICD-10-CM | POA: Diagnosis not present

## 2016-05-23 DIAGNOSIS — I130 Hypertensive heart and chronic kidney disease with heart failure and stage 1 through stage 4 chronic kidney disease, or unspecified chronic kidney disease: Secondary | ICD-10-CM | POA: Insufficient documentation

## 2016-05-23 DIAGNOSIS — E1122 Type 2 diabetes mellitus with diabetic chronic kidney disease: Secondary | ICD-10-CM | POA: Diagnosis not present

## 2016-05-23 DIAGNOSIS — Z9114 Patient's other noncompliance with medication regimen: Secondary | ICD-10-CM

## 2016-05-23 DIAGNOSIS — Z95810 Presence of automatic (implantable) cardiac defibrillator: Secondary | ICD-10-CM | POA: Insufficient documentation

## 2016-05-23 DIAGNOSIS — N182 Chronic kidney disease, stage 2 (mild): Secondary | ICD-10-CM | POA: Insufficient documentation

## 2016-05-23 DIAGNOSIS — J449 Chronic obstructive pulmonary disease, unspecified: Secondary | ICD-10-CM | POA: Insufficient documentation

## 2016-05-23 DIAGNOSIS — I2510 Atherosclerotic heart disease of native coronary artery without angina pectoris: Secondary | ICD-10-CM | POA: Diagnosis not present

## 2016-05-23 DIAGNOSIS — I5022 Chronic systolic (congestive) heart failure: Secondary | ICD-10-CM | POA: Diagnosis not present

## 2016-05-23 MED ORDER — OXYCODONE-ACETAMINOPHEN 5-325 MG PO TABS
1.0000 | ORAL_TABLET | Freq: Once | ORAL | Status: AC
  Administered 2016-05-23: 1 via ORAL
  Filled 2016-05-23: qty 1

## 2016-05-23 MED ORDER — TRAMADOL HCL 50 MG PO TABS: 50.0000 mg | ORAL_TABLET | Freq: Four times a day (QID) | ORAL | 0 refills | Status: DC | PRN

## 2016-05-23 MED ORDER — ACETAMINOPHEN 500 MG PO TABS: 500.0000 mg | ORAL_TABLET | Freq: Four times a day (QID) | ORAL | 0 refills | Status: DC | PRN

## 2016-05-23 NOTE — ED Provider Notes (Signed)
[facility]-EMERGENCY DEPT Provider Note   CSN: [id number] Arrival date & time: 05/23/16  1613    History   Chief Complaint Chief Complaint  Patient presents with  . Wound Check    HPI Jessica [name] is a 56 y.o. female.  56 y/o female with extensive PMH including coronary artery disease, chronic systolic heart failure due to ICM with LVEF of 25%, status post ICD placement, DVT (on chronic coumadin, but noncompliant x 2 months), diabetes mellitus, DM, tobacco use and PAD presents to the emergency department for evaluation of sores to her back and neck x >6 months. She states that she has much discomfort associated with these sores; they "feel like glass". She has noticed worsening of her sores over the past 2 months and attributes this worsening to an increase in her torsemide dose 4 months ago. She has not had any associated fevers. She does report some serous drainage, but denies purulence. She is been followed by a dermatologist in the past who gave her a cream, but this is "long gone". Patient states that she has been taking Tylenol, Aleve, and ibuprofen for pain without relief. She states that the pain is what prompted her to seek evaluation today.   Patient reminded that she is unable to take NSAIDs while on Coumadin. She states that she did notice a small amount of blood in her stool 4 weeks ago, at which time she discontinued her Coumadin. She denies any melena or hematochezia lately.   The history is provided by the patient. No language interpreter was used.  Wound Check     Past Medical History:  Diagnosis Date  . AICD (automatic cardioverter/defibrillator) present    a. Biotronik placed 2009. b. ICD discharge 2011, 03/2013.  [name] Alcohol abuse   . Anxiety   . Arterial embolism (HCC)    a. H/o RLE extremity ischemia in 2012 s/p right iliofemoral embolectomy with compartment fasciotomy.  . Asthma   . CAD (coronary artery disease)    a. 1996: anterior wall MI tx with PTCA. b. 2003:  PCI/DES to circumflex marginal. c. 2004: PTCA/CBA/stenting of ostium of D1 and LAD. d. 06/2003: s/p PTCA/DES of mRCA. e. 11/2003: CBA of bifurcation diagonal LAD. f. 2007: PTCA/DES to RCA, CBA/stenting to OM2.  . Chronic systolic heart failure (HCC)    a.  secondary to severe ischemic cardiomyopathy with EF 20-25%;  b. s/p rimplantation of Biotronik single chamber ICD by Dr Ladona Ridgel 02/2008;  c.  RHC 07/2013 showed well compensated left sided pressures with moderate PAH and low cardiac output. d. 2D Echo 03/2014: EF 20-25%, grade 2 DD, PAP .  . CKD (chronic kidney disease), stage II   . COPD (chronic obstructive pulmonary disease) (HCC)   . Diabetes mellitus, type 2 (HCC)   . Dizziness   . DVT (deep venous thrombosis) (HCC)    a. 06/2010.  [name] GERD (gastroesophageal reflux disease)   . History of noncompliance with medical treatment   . HLD (hyperlipidemia)   . LV (left ventricular) mural thrombus    a. Dx on echo 03/2014.  [name] Myocardial infarction   . Pulmonary hypertension   . Sleep apnea   . Tobacco abuse   . V-tach [facility])     Patient Active Problem List   Diagnosis Date Noted  . CAD (coronary artery disease) 01/15/2015  . Impetigo 01/15/2015  . Acute kidney injury superimposed on chronic kidney disease (HCC) 01/15/2015  . Hyponatremia 01/15/2015  . Hypomagnesemia 01/15/2015  . Diabetes mellitus type  2, uncontrolled (HCC) 01/15/2015  . Chronic systolic CHF (congestive heart failure) (HCC) 05/05/2014  . Ischemic cardiomyopathy 05/05/2014  . COPD (chronic obstructive pulmonary disease) (HCC) 05/05/2014  . Encounter for therapeutic drug monitoring 04/28/2014  . LV (left ventricular) mural thrombus (HCC) 03/25/2014  . Rheumatoid factor positive 10/26/2013  . OSA (obstructive sleep apnea) 09/03/2013  . Pulmonary HTN (HCC) 09/03/2013  . PAD (peripheral artery disease) (HCC) 10/06/2012  . Chest pain 07/03/2012  . VT (ventricular tachycardia) (HCC) 12/20/2011  . Depression with anxiety  04/30/2011  . Rash 08/24/2010  . Deep vein thrombosis of lower leg (HCC) 06/28/2010  . HYPOPOTASSEMIA 12/24/2008  . HYPERCHOLESTEROLEMIA 07/15/2008  . NICOTINE ADDICTION 07/15/2008  . DYSPNEA 03/21/2008  . NONSPECIFIC LOW BP 03/21/2008  . ICD (implantable cardioverter-defibrillator) in place 03/21/2008    Past Surgical History:  Procedure Laterality Date  . CARDIAC CATHETERIZATION    . DIAGNOSTIC LAPAROSCOPY    . INSERT / REPLACE / REMOVE PACEMAKER    . RIGHT HEART CATHETERIZATION N/A 07/23/2013   Procedure: RIGHT HEART CATH;  Surgeon: Dolores Pattyaniel R Bensimhon, MD;  [facility];  Service: Cardiovascular;  Laterality: N/A;  . TUBAL LIGATION      OB History    No data available       Home Medications    Prior to Admission medications   Medication Sig Start Date End Date Taking? Authorizing Provider  albuterol (PROVENTIL HFA;VENTOLIN HFA) 108 (90 BASE) MCG/ACT inhaler Inhale 2 puffs into the lungs every 6 (six) hours as needed for wheezing or shortness of breath. 10/26/13  Yes Lupita Leashouglas B McQuaid, MD  albuterol (PROVENTIL) (2.5 MG/3ML) 0.083% nebulizer solution Take 2.5 mg by nebulization every 6 (six) hours as needed for wheezing or shortness of breath.   Yes Historical Provider, MD  digoxin (LANOXIN) 0.25 MG tablet Take 0.5 tablets (0.125 mg total) by mouth daily. 02/02/15  Yes Bevelyn Bucklesaniel R Bensimhon, MD  magnesium oxide (MAG-OX) 400 (241.3 Mg) MG tablet TAKE 1/2 TABLET (200MG ) EVERY DAY 01/30/16  Yes Dolores Pattyaniel R Bensimhon, MD  metolazone (ZAROXOLYN) 2.5 MG tablet Take once daily as needed (3 x max per week) for weight gain/swelling. Take extra 20 meq potassium pill with this. 09/28/15  Yes Laurey Moralealton S McLean, MD  potassium chloride SA (KLOR-CON M20) 20 MEQ tablet Take 40 mg (2 tabs) in am and 20 meq (1 tab) in pm 11/28/15  Yes Dolores Pattyaniel R Bensimhon, MD  pravastatin (PRAVACHOL) 40 MG tablet Take 1 tablet (40 mg total) by mouth every evening. 01/11/15  Yes Laurey Moralealton S McLean, MD  sildenafil  (REVATIO) 20 MG tablet Take 2 tablets (40 mg total) by mouth 3 (three) times daily. 11/03/15  Yes Dolores Pattyaniel R Bensimhon, MD  spironolactone (ALDACTONE) 25 MG tablet TAKE 1 TABLET (25 MG TOTAL) BY MOUTH DAILY. 02/28/16  Yes Amy D Clegg, NP  torsemide (DEMADEX) 20 MG tablet Take 40 mg by mouth 2 (two) times daily.   Yes Historical Provider, MD  warfarin (COUMADIN) 5 MG tablet Take As Directed by Coumadin Clinic 09/28/15  Yes Dolores Pattyaniel R Bensimhon, MD  acetaminophen (TYLENOL) 500 MG tablet Take 1 tablet (500 mg total) by mouth every 6 (six) hours as needed. 05/23/16   Antony MaduraKelly Yerachmiel Spinney, PA-C  digoxin (LANOXIN) 0.125 MG tablet Take 1 tablet (125 mcg total) by mouth daily. Patient not taking: Reported on 05/23/2016 02/15/16   Dolores Pattyaniel R Bensimhon, MD  potassium chloride SA (KLOR-CON M20) 20 MEQ tablet Take 40 mg (2 tabs) in am and 20 meq (1  tab) in pm Patient not taking: Reported on 05/23/2016 09/28/15   Laurey Morale, MD  traMADol (ULTRAM) 50 MG tablet Take 1 tablet (50 mg total) by mouth every 6 (six) hours as needed. 05/23/16   Antony Madura, PA-C    Family History Family History  Problem Relation Age of Onset  . Coronary artery disease      FMILY HISTORY    Social History Social History  Substance Use Topics  . Smoking status: Current Some Day Smoker    Packs/day: 0.25    Years: 21.00    Types: Cigars  . Smokeless tobacco: Never Used     Comment: Smokes 2-3 cigars daily, quit smoking cigarettes 3 yrs ago.   . Alcohol use No     Allergies   Nyquil multi-symptom [pseudoeph-doxylamine-dm-apap]; Sudafed [pseudoephedrine]; Zocor [simvastatin]; Latex; and Ramipril   Review of Systems Review of Systems Ten systems reviewed and are negative for acute change, except as noted in the HPI.    Physical Exam Updated Vital Signs BP 105/67   Pulse 89   Temp 98 F (36.7 C) (Oral)   Resp 18   SpO2 98%   Physical Exam  Constitutional: She is oriented to person, place, and time. She appears well-developed  and well-nourished. No distress.  Patient tearful. Thin and frail appearing.  HENT:  Head: Normocephalic and atraumatic.  Eyes: Conjunctivae and EOM are normal. No scleral icterus.  Neck: Normal range of motion.  Pulmonary/Chest: Effort normal. No respiratory distress. She has no wheezes.  Respirations even and unlabored  Musculoskeletal: Normal range of motion.  Neurological: She is alert and oriented to person, place, and time.  Skin: Skin is warm and dry. She is not diaphoretic. No pallor.  Multiple grade 2 skin ulcers ranging in size; largest on posterior right side of neck. Scant serous drainage. No purulent drainage. No surrounding erythema or heat to touch. No induration.  Psychiatric: She has a normal mood and affect. Her behavior is normal.  Nursing note and vitals reviewed.    ED Treatments / Results  Labs (all labs ordered are listed, but only abnormal results are displayed) Labs Reviewed - No data to display  EKG  EKG Interpretation None       Radiology No results found.  Procedures Procedures (including critical care time)  Medications Ordered in ED Medications  oxyCODONE-acetaminophen (PERCOCET/ROXICET) 5-325 MG per tablet 1 tablet (1 tablet Oral Given 05/23/16 2039)      Sherryl Barters on back   ^sores on back   ^posterior neck   Initial Impression / Assessment and Plan / ED Course  I have reviewed the triage vital signs and the nursing notes.  Pertinent labs & imaging results that were available during my care of the patient were reviewed by me and considered in my medical decision making (see chart for details).     56 year old female presents to the emergency department for evaluation of sores to her back, chest, and abdomen. She notes her worst sore to the on her posterior neck. Symptoms have been chronic, present for greater than 6 months. She believes that her symptoms have been worsening over the past 2 months since her torsemide dose was  increased.  Patient with scattered ulcers as seen above. There is no evidence of secondary infection or cellulitis. No concern for abscess. Patient is afebrile. She has stable vital signs. She states that she primarily came to the emergency department over complaints of pain. She was given 1 dose of Percocet  in the ED.  I do not see indication for further emergent workup of the patient's symptoms, especially in light of their chronicity. I have encouraged follow-up with her dermatologist again and will referred to wound care to ensure proper healing. Have recommended wet-to-dry dressings. It was also found that the patient has been noncompliant with her Coumadin. She was instructed to restart this and to avoid NSAIDs. Return precautions discussed and provided. Patient discharged in stable condition with no unaddressed concerns.   Final Clinical Impressions(s) / ED Diagnoses   Final diagnoses:  Skin ulcer of back, limited to breakdown of skin (HCC)  Noncompliance with medication regimen    New Prescriptions Discharge Medication List as of 05/23/2016 10:16 PM    START taking these medications   Details  acetaminophen (TYLENOL) 500 MG tablet Take 1 tablet (500 mg total) by mouth every 6 (six) hours as needed., Starting Wed 05/23/2016, Print    traMADol (ULTRAM) 50 MG tablet Take 1 tablet (50 mg total) by mouth every 6 (six) hours as needed., Starting Wed 05/23/2016, Print         [place], PA-C 05/23/16 2326    Bethann Berkshire, MD 05/25/16 [phone]

## 2016-05-23 NOTE — ED Notes (Signed)
Pt verbalized understanding discharge instructions and denies any further needs or questions at this time. VS stable, ambulatory and steady gait.   

## 2016-05-23 NOTE — ED Triage Notes (Signed)
Pt has open wounds on back and neck. Largest wound is on back of neck that has a pink wound bed that looks like a large blister. Pt has other open wounds on back that are are various stages.

## 2016-05-23 NOTE — Discharge Instructions (Signed)
We advise the use of wet to dry dressings on your wounds. Take tylenol for pain, as needed. You may take Tramadol for sever pain. Follow up with the wound care center and your dermatologist - Call these offices in the morning to schedule an appointment. Start taking your Warfarin again, as prescribed. Avoid ibuprofen, aleve, and naproxen when on blood thinners. Discuss changes to your fluid pill with your heart doctor. You may return as needed for new or concerning symptoms.

## 2016-05-23 NOTE — ED Notes (Signed)
Ethelene Brownsnthony - Son - [phone]

## 2016-05-23 NOTE — ED Notes (Signed)
Lyman BishopLawrence, NP was consulted about this patient, directed patient to ed

## 2016-06-05 ENCOUNTER — Ambulatory Visit (HOSPITAL_COMMUNITY)
Admission: RE | Admit: 2016-06-05 | Discharge: 2016-06-05 | Disposition: A | Discharge status: Home / Self Care | Payer: Medicare Other | Source: Ambulatory Visit | Attending: Internal Medicine | Admitting: Internal Medicine

## 2016-06-05 ENCOUNTER — Encounter (HOSPITAL_COMMUNITY): Payer: Self-pay | Admitting: Internal Medicine

## 2016-06-05 VITALS — BP 118/68 | HR 84 | Wt 129.2 lb

## 2016-06-05 DIAGNOSIS — E1151 Type 2 diabetes mellitus with diabetic peripheral angiopathy without gangrene: Secondary | ICD-10-CM | POA: Diagnosis not present

## 2016-06-05 DIAGNOSIS — I2510 Atherosclerotic heart disease of native coronary artery without angina pectoris: Secondary | ICD-10-CM | POA: Diagnosis not present

## 2016-06-05 DIAGNOSIS — Z86718 Personal history of other venous thrombosis and embolism: Secondary | ICD-10-CM | POA: Insufficient documentation

## 2016-06-05 DIAGNOSIS — K219 Gastro-esophageal reflux disease without esophagitis: Secondary | ICD-10-CM | POA: Diagnosis not present

## 2016-06-05 DIAGNOSIS — Z720 Tobacco use: Secondary | ICD-10-CM | POA: Insufficient documentation

## 2016-06-05 DIAGNOSIS — Z9114 Patient's other noncompliance with medication regimen: Secondary | ICD-10-CM | POA: Diagnosis not present

## 2016-06-05 DIAGNOSIS — G4730 Sleep apnea, unspecified: Secondary | ICD-10-CM | POA: Diagnosis not present

## 2016-06-05 DIAGNOSIS — F419 Anxiety disorder, unspecified: Secondary | ICD-10-CM | POA: Insufficient documentation

## 2016-06-05 DIAGNOSIS — T814XXD Infection following a procedure, subsequent encounter: Secondary | ICD-10-CM | POA: Diagnosis not present

## 2016-06-05 DIAGNOSIS — I2721 Secondary pulmonary arterial hypertension: Secondary | ICD-10-CM | POA: Insufficient documentation

## 2016-06-05 DIAGNOSIS — IMO0001 Reserved for inherently not codable concepts without codable children: Secondary | ICD-10-CM

## 2016-06-05 DIAGNOSIS — F1010 Alcohol abuse, uncomplicated: Secondary | ICD-10-CM | POA: Diagnosis not present

## 2016-06-05 DIAGNOSIS — E1122 Type 2 diabetes mellitus with diabetic chronic kidney disease: Secondary | ICD-10-CM | POA: Diagnosis not present

## 2016-06-05 DIAGNOSIS — Z955 Presence of coronary angioplasty implant and graft: Secondary | ICD-10-CM | POA: Insufficient documentation

## 2016-06-05 DIAGNOSIS — I493 Ventricular premature depolarization: Secondary | ICD-10-CM | POA: Diagnosis not present

## 2016-06-05 DIAGNOSIS — Z95810 Presence of automatic (implantable) cardiac defibrillator: Secondary | ICD-10-CM | POA: Insufficient documentation

## 2016-06-05 DIAGNOSIS — I5022 Chronic systolic (congestive) heart failure: Secondary | ICD-10-CM | POA: Diagnosis not present

## 2016-06-05 DIAGNOSIS — I252 Old myocardial infarction: Secondary | ICD-10-CM | POA: Diagnosis not present

## 2016-06-05 DIAGNOSIS — I749 Embolism and thrombosis of unspecified artery: Secondary | ICD-10-CM | POA: Insufficient documentation

## 2016-06-05 DIAGNOSIS — J449 Chronic obstructive pulmonary disease, unspecified: Secondary | ICD-10-CM | POA: Diagnosis not present

## 2016-06-05 DIAGNOSIS — Z8249 Family history of ischemic heart disease and other diseases of the circulatory system: Secondary | ICD-10-CM | POA: Diagnosis not present

## 2016-06-05 DIAGNOSIS — E785 Hyperlipidemia, unspecified: Secondary | ICD-10-CM | POA: Diagnosis not present

## 2016-06-05 DIAGNOSIS — I255 Ischemic cardiomyopathy: Secondary | ICD-10-CM | POA: Insufficient documentation

## 2016-06-05 DIAGNOSIS — Z7901 Long term (current) use of anticoagulants: Secondary | ICD-10-CM | POA: Diagnosis not present

## 2016-06-05 DIAGNOSIS — J984 Other disorders of lung: Secondary | ICD-10-CM | POA: Insufficient documentation

## 2016-06-05 LAB — CBC
HEMATOCRIT: 38.3 % (ref 36.0–46.0)
Hemoglobin: 12.9 g/dL (ref 12.0–15.0)
MCH: 27.9 pg (ref 26.0–34.0)
MCHC: 33.7 g/dL (ref 30.0–36.0)
MCV: 82.7 fL (ref 78.0–100.0)
Platelets: 160 10*3/uL (ref 150–400)
RBC: 4.63 MIL/uL (ref 3.87–5.11)
RDW: 16.7 % — ABNORMAL HIGH (ref 11.5–15.5)
WBC: 4.9 10*3/uL (ref 4.0–10.5)

## 2016-06-05 LAB — BASIC METABOLIC PANEL
Anion gap: 12 (ref 5–15)
BUN: 28 mg/dL — ABNORMAL HIGH (ref 6–20)
CHLORIDE: 95 mmol/L — AB (ref 101–111)
CO2: 27 mmol/L (ref 22–32)
Calcium: 9.3 mg/dL (ref 8.9–10.3)
Creatinine, Ser: 1.48 mg/dL — ABNORMAL HIGH (ref 0.44–1.00)
GFR calc non Af Amer: 38 mL/min — ABNORMAL LOW (ref >60)
GFR, EST AFRICAN AMERICAN: 44 mL/min — AB (ref >60)
Glucose, Bld: 155 mg/dL — ABNORMAL HIGH (ref 65–99)
POTASSIUM: 4.1 mmol/L (ref 3.5–5.1)
Sodium: 134 mmol/L — ABNORMAL LOW (ref 135–145)

## 2016-06-05 LAB — DIGOXIN LEVEL: Digoxin Level: 0.3 ng/mL — ABNORMAL LOW (ref 0.8–2.0)

## 2016-06-05 MED ORDER — DOXYCYCLINE HYCLATE 100 MG PO CAPS: 100.0000 mg | ORAL_CAPSULE | Freq: Two times a day (BID) | ORAL | 0 refills | Status: DC

## 2016-06-05 MED ORDER — TRAMADOL HCL 50 MG PO TABS: 50.0000 mg | ORAL_TABLET | Freq: Four times a day (QID) | ORAL | 0 refills | Status: DC | PRN

## 2016-06-05 NOTE — Progress Notes (Signed)
Patient ID: [name], female   DOB: 1961-01-16, 56 y.o.   MRN: [id number]     Advanced Heart Failure Clinic Note   EP: Dr Ladona Ridgel HF: Dr. Gala Romney   HPI: [name] is a 56 y.o. with a history of ICM, chronic systolic heart failure EF 25%, CAD s/p multiple interventions, ICD Biotronic, DM, hyperlipidemia, smoker, S/P right iliofemoral embolectomy and compartment fasciotomy of the right leg by Dr. Darrick Penna with LLE DVT 06/2010.  S/P ICD discharge 03/13/13.    She has seen Dr. Kendrick Fries as part of her [facility] workup.  PFTs were done in 7/15 suggesting an interstitial process (restrictive) with markedly decreased DLCO): FVC 74%%, FEV1 81%, ratio 107%, TLC 76%, DLCO 29%. CTA chest in 4/15 showed no PE but did have focal honeycomb changes in the lung bases.   Admitted  11/3-11/5/16 for recurrent HF. Diuresed from 140 -> 133.   Returns today for HF follow up. She has multiple skin ulcers on her back. There is a large wound on the back of her neck. She was seen in the ED last week, no culture done, she was afebrile. She is in a great amount of pain today, tearful and anxious. Says that the wound on her neck feels like glass cutting her. She has been putting Vaseline and lotion on it and keeping it covered with a wet gauze. There are multiple lesions on her back, and one on her forehead. Lesions started appearing about a year ago, ever since she started torsemide.   She denies fevers, but does feel like she has chills today. Denies SOB.   Denies SOB, no orthopnea. Has been taking her medications, has missed a few doses this month. Weight stable, down 10 pounds since last visit in October. Weights at home 125-127 pounds. Continues to smoke a cigar occasionally, drinks ETOH occasionally. Eats out occasionally, drinks more than 2L a day.    RHC 07/23/2013:  RA 15 RV 78/5/18 PA 79/31 (47) PCWP 12 Fick CO/CI 2.1/1.3 Thermo CO/CI 3.4/2.0 PVR 16.5 PA 39% and 40%  ECHO 02/22/11: Only basal function preserved.  EF 20-25%. ECHO 12/20/2011 EF 15%  ECHO 6/15 EF 25% with diffuse hypokinesis, severe LV dilation, mild MR, RV mildly dilated with moderately decreased systolic function.  ECHO 03/25/14 EF 20-25%. Moderate RV dysfunction + apical clot ECHO 8/17: EF 20-25% RVSP 80   6 minute walk (7/15): 384 m 6 MW (01/28/14): 1100 ft (384m), O2 sats ranged from 98-92% and HR ranged from 85-120  ECG: NSR, PVCs, left axis deviation  Labs: 03/13/13 K 3.8 Creatinine 0.90 03/17/13 K 4.7 Creatinine 0.94  Magnesium 1.6 Started on magnesium 4/15 K 3.7 => 4.6, Creatinine 0.89 => 1.23 Pro BNP 1848  07/14/2013: K 4.1, creatinine 0.88, digoxin 1.0, Pro BNP 2130 08/05/13 Dig 0.5  6/15 K 3.8, creatinine 1.2, digoxin 0.5, HCT 43.6 09/30/13 HIV NR, ANA negative 2/16 digoxin 0.7, K 4, creatinine 1.01, HCT 42.1  ROS: All systems negative except as listed in HPI, PMH and Problem List.  SH: smokes cigar daily, Drinks occasional beer; lives with boyfriend and 2 children [name]   FH: Mom living: DM2, HTN, CKD  Father deceased: MI, CAD, HTN   Past Medical History:  Diagnosis Date  . AICD (automatic cardioverter/defibrillator) present    a. Biotronik placed 2009. b. ICD discharge 2011, 03/2013.  [name] Alcohol abuse   . Anxiety   . Arterial embolism (HCC)    a. H/o RLE extremity ischemia in 2012 s/p right iliofemoral embolectomy  with compartment fasciotomy.  . Asthma   . CAD (coronary artery disease)    a. 1996: anterior wall MI tx with PTCA. b. 2003: PCI/DES to circumflex marginal. c. 2004: PTCA/CBA/stenting of ostium of D1 and LAD. d. 06/2003: s/p PTCA/DES of mRCA. e. 11/2003: CBA of bifurcation diagonal LAD. f. 2007: PTCA/DES to RCA, CBA/stenting to OM2.  . Chronic systolic heart failure (HCC)    a.  secondary to severe ischemic cardiomyopathy with EF 20-25%;  b. s/p rimplantation of Biotronik single chamber ICD by Dr Ladona Ridgel 02/2008;  c.  RHC 07/2013 showed well compensated left sided pressures with moderate PAH and low  cardiac output. d. 2D Echo 03/2014: EF 20-25%, grade 2 DD, PAP .  . CKD (chronic kidney disease), stage II   . COPD (chronic obstructive pulmonary disease) (HCC)   . Diabetes mellitus, type 2 (HCC)   . Dizziness   . DVT (deep venous thrombosis) (HCC)    a. 06/2010.  [name] GERD (gastroesophageal reflux disease)   . History of noncompliance with medical treatment   . HLD (hyperlipidemia)   . LV (left ventricular) mural thrombus    a. Dx on echo 03/2014.  [name] Myocardial infarction   . Pulmonary hypertension   . Sleep apnea   . Tobacco abuse   . V-tach [facility])     Current Outpatient Prescriptions  Medication Sig Dispense Refill  . acetaminophen (TYLENOL) 500 MG tablet Take 1 tablet (500 mg total) by mouth every 6 (six) hours as needed. 30 tablet 0  . albuterol (PROVENTIL HFA;VENTOLIN HFA) 108 (90 BASE) MCG/ACT inhaler Inhale 2 puffs into the lungs every 6 (six) hours as needed for wheezing or shortness of breath. 1 Inhaler 2  . albuterol (PROVENTIL) (2.5 MG/3ML) 0.083% nebulizer solution Take 2.5 mg by nebulization every 6 (six) hours as needed for wheezing or shortness of breath.    . digoxin (LANOXIN) 0.125 MG tablet Take 1 tablet (125 mcg total) by mouth daily. (Patient not taking: Reported on 05/23/2016) 30 tablet 3  . digoxin (LANOXIN) 0.25 MG tablet Take 0.5 tablets (0.125 mg total) by mouth daily. 30 tablet 5  . magnesium oxide (MAG-OX) 400 (241.3 Mg) MG tablet TAKE 1/2 TABLET (200MG ) EVERY DAY 15 tablet 3  . metolazone (ZAROXOLYN) 2.5 MG tablet Take once daily as needed (3 x max per week) for weight gain/swelling. Take extra 20 meq potassium pill with this. 15 tablet 6  . potassium chloride SA (KLOR-CON M20) 20 MEQ tablet Take 40 mg (2 tabs) in am and 20 meq (1 tab) in pm (Patient not taking: Reported on 05/23/2016) 90 tablet 6  . potassium chloride SA (KLOR-CON M20) 20 MEQ tablet Take 40 mg (2 tabs) in am and 20 meq (1 tab) in pm 90 tablet 1  . pravastatin (PRAVACHOL) 40 MG tablet Take 1  tablet (40 mg total) by mouth every evening. 90 tablet 3  . sildenafil (REVATIO) 20 MG tablet Take 2 tablets (40 mg total) by mouth 3 (three) times daily. 360 tablet 6  . spironolactone (ALDACTONE) 25 MG tablet TAKE 1 TABLET (25 MG TOTAL) BY MOUTH DAILY. 30 tablet 3  . torsemide (DEMADEX) 20 MG tablet Take 40 mg by mouth 2 (two) times daily.    . traMADol (ULTRAM) 50 MG tablet Take 1 tablet (50 mg total) by mouth every 6 (six) hours as needed. 15 tablet 0  . warfarin (COUMADIN) 5 MG tablet Take As Directed by Coumadin Clinic 30 tablet 0   No current facility-administered  medications for this encounter.       There were no vitals filed for this visit. Wt Readings from Last 3 Encounters:  12/20/15 139 lb 3.2 oz (63.1 kg)  11/03/15 130 lb 4 oz (59.1 kg)  09/28/15 132 lb 8 oz (60.1 kg)    PHYSICAL EXAM: General: Anxious female, tearful, in pain from lesions on back.   HEENT: Normal  Neck: supple. JVP 6-7cm. Carotids 2+ bilaterally; no bruits. No thyromegaly.  Cor: PMI laterally displaced. Regular rate and rhythm. Split s2. 2/6 SEM LUSB No s3 Lungs: Clear to auscultation in all lobes.  Abdomen: obese, soft, non-tender, non-distended, no HSM. No bruits or masses. Normal bowel sounds.  Extremities: no cyanosis, no edema. Diffuse eroded lesions on back and neck.  Neuro: alert & orientedx3, cranial nerves grossly intact. Moves all 4 extremities w/o difficulty. Affect anxious.   ASSESSMENT & PLAN: 1. Chronic systolic heart failure: Ischemic cardiomyopathy.  Echo (1/16) with EF 20-25% with mod RV dysfunction  S/P Biotronic ICD 2009.   - Chronic NYHA III symptoms - Volume status stable.   - With partial thickness skin lesions and patient states that all of this started when she was started on lasix and then worsened with torsemide, highly unlikely that her skin lesions are sulfa allergy related since she has been on Lasix and then torsemide for so long. However given that she is in so much pain  today, the risk for infection with open lesions we will switch her to ethacrynic acid 75mg  BID. We will follow up with her next week.  - Intolerant to BBs with hypotension even at low dose.  ACEI limited by same issue.   - Continue spironolactone 25 mg daily  - Continue digoxin 0.25 mg daily. Will get dig level today.  - She is not interested in LVAD but would be open to the possibility of a cardiac transplantation but not good candidate due to noncompliance and tobacco use..  2.  CAD: S/P multiple interventions. No chest pain.  - Continue ASA 81 - Continue pravastatin 40 mg daily.   3.  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  Echo reviewed personally today with PAP in 80s - Continue sildenafil to 40 TID.  - No NTG with Revatio.  - No indication for CPAP on sleep study.  - Based on CT and PFTs, concern for interstitial lung disease. Follows with pulmonary  - Serologic workup: ANA negative, anti-SCL70, RF, HIV NR   - 10/2013  V/Q scan--> normal   - Continues to smoke occasionally.  4. LV thrombus: On coumadin.  - Stressed importance of regular follow up in coumadin clinic.  5. Peripheral arterial disease: Evaluated by Dr. Kirke CorinArida in Oct. 2017, occluded left SFA with mild to mod reduced ABI, no revascularization plans at this time.  6. Partial thickness lesions:patient was seen last week in the ED for lesions - see pictures in ED visit dated 05/23/16. There is a large 8x10cm partial thickness lesion on her neck, culture obtained today in the office. Start Doxycycline 100mg  BID for 10 days. Also will give 15 tablets of tramadol as patient is in a great deal of pain. We will follow up with her next week.    Little IshikawaErin E Smith, NP-C  11:10 AM  Patient seen and examined with Suzzette RighterErin Smith, NP. We discussed all aspects of the encounter. I agree with the assessment and plan as stated above.   Stable from a HF  failure perspective with NYHA III symptoms and stable volume  status. No angina. She is worried that skin lesions coming from lasix or torsemide. I do not think this is the case but we will try switching to ethacrynic acid (non-sulfa loop diuretic). Use 75 bid - dosing d/w PharmD.   Lesions cultured in Clinic and growing rare staph aureus. We empirically started doxy 100 bid. Will need f/u with Dermatology. Have also given tramadol for pain. Stressed need to stop smoking.   Arvilla Meres, MD  7:12 PM

## 2016-06-05 NOTE — Patient Instructions (Signed)
Stop Torsemide  Start Ethacrynic Acid 150 mg (6 tabs) Twice daily   Start Doxycycline 100 mg Twice daily FOR 10 DAYS, antibiotic  Tramadol 50 mg as needed for pain  Labs and wound culture done today  Your physician recommends that you schedule a follow-up appointment in: 1 week

## 2016-06-06 MED ORDER — ETHACRYNIC ACID 25 MG PO TABS: 150.0000 mg | ORAL_TABLET | Freq: Two times a day (BID) | ORAL | 6 refills | Status: DC

## 2016-06-07 ENCOUNTER — Emergency Department (HOSPITAL_COMMUNITY): Payer: Medicare Other

## 2016-06-07 ENCOUNTER — Encounter (HOSPITAL_COMMUNITY): Payer: Self-pay | Admitting: Emergency Medicine

## 2016-06-07 ENCOUNTER — Emergency Department (HOSPITAL_COMMUNITY)
Admission: EM | Admit: 2016-06-07 | Discharge: 2016-06-07 | Disposition: A | Discharge status: Home / Self Care | Payer: Medicare Other | Attending: Emergency Medicine | Admitting: Emergency Medicine

## 2016-06-07 DIAGNOSIS — N182 Chronic kidney disease, stage 2 (mild): Secondary | ICD-10-CM | POA: Diagnosis not present

## 2016-06-07 DIAGNOSIS — K208 Other esophagitis without bleeding: Secondary | ICD-10-CM

## 2016-06-07 DIAGNOSIS — Z7984 Long term (current) use of oral hypoglycemic drugs: Secondary | ICD-10-CM | POA: Diagnosis not present

## 2016-06-07 DIAGNOSIS — I5022 Chronic systolic (congestive) heart failure: Secondary | ICD-10-CM | POA: Diagnosis not present

## 2016-06-07 DIAGNOSIS — E1122 Type 2 diabetes mellitus with diabetic chronic kidney disease: Secondary | ICD-10-CM | POA: Diagnosis not present

## 2016-06-07 DIAGNOSIS — I2510 Atherosclerotic heart disease of native coronary artery without angina pectoris: Secondary | ICD-10-CM | POA: Diagnosis not present

## 2016-06-07 DIAGNOSIS — Z79899 Other long term (current) drug therapy: Secondary | ICD-10-CM | POA: Insufficient documentation

## 2016-06-07 DIAGNOSIS — F17210 Nicotine dependence, cigarettes, uncomplicated: Secondary | ICD-10-CM | POA: Insufficient documentation

## 2016-06-07 DIAGNOSIS — R0989 Other specified symptoms and signs involving the circulatory and respiratory systems: Secondary | ICD-10-CM | POA: Diagnosis present

## 2016-06-07 DIAGNOSIS — I130 Hypertensive heart and chronic kidney disease with heart failure and stage 1 through stage 4 chronic kidney disease, or unspecified chronic kidney disease: Secondary | ICD-10-CM | POA: Insufficient documentation

## 2016-06-07 LAB — AEROBIC CULTURE  (SUPERFICIAL SPECIMEN)

## 2016-06-07 LAB — AEROBIC CULTURE W GRAM STAIN (SUPERFICIAL SPECIMEN)

## 2016-06-07 MED ORDER — SULFAMETHOXAZOLE-TRIMETHOPRIM 800-160 MG PO TABS: 1.0000 | ORAL_TABLET | Freq: Two times a day (BID) | ORAL | 0 refills | Status: AC

## 2016-06-07 MED ORDER — GI COCKTAIL ~~LOC~~
30.0000 mL | Freq: Once | ORAL | Status: AC
  Administered 2016-06-07: 30 mL via ORAL
  Filled 2016-06-07: qty 30

## 2016-06-07 MED ORDER — GI COCKTAIL ~~LOC~~: 15.0000 mL | Freq: Two times a day (BID) | ORAL | 0 refills | Status: AC

## 2016-06-07 MED ORDER — PANTOPRAZOLE SODIUM 40 MG PO TBEC: 40.0000 mg | DELAYED_RELEASE_TABLET | Freq: Two times a day (BID) | ORAL | 0 refills | Status: DC

## 2016-06-07 NOTE — ED Triage Notes (Signed)
Brought in by EMS from home with c/o "pill stuck in throat".  Pt reports that she took her Doxycycline last night at around 2000 but it "feels like the pill got stuck in her throat".  Pt has been able to eat and drink since then.  Pt denies shortness of breath, nausea or vomiting.

## 2016-06-07 NOTE — ED Notes (Signed)
Bed: AV40WA11 Expected date:  Expected time:  Means of arrival:  Comments: EMS "choking" after taking a pill

## 2016-06-07 NOTE — Discharge Instructions (Signed)
Call the GI doctor (Dr Christella HartiganJacobs) on 06/11/16 if not better. Return immediately if worsening or unable to swallow.

## 2016-06-07 NOTE — ED Provider Notes (Addendum)
[facility]-EMERGENCY DEPT Provider Note   CSN: [id number] Arrival date & time: 06/07/16  0606     History   Chief Complaint Chief Complaint  Patient presents with  . Foreign Body    HPI [name] is a 56 y.o. female.  HPI  56 year old female with multiple medical problems presents after feeling like a doxycycline pill is stuck in her throat. It feels like is stuck in her right lower neck. She took this pill around 8 PM last night. About an hour later she has noticed shortness of breath, burning to her neck, and pain. She has pain when swallowing. She was able to eat and drink after this occurred. Given continued pain this morning she came in for evaluation. She is on doxycycline for concern for a posterior neck skin infection. She tells me she has been off of her Coumadin for about one week because she ran out. No vomiting. No coughing.  Past Medical History:  Diagnosis Date  . AICD (automatic cardioverter/defibrillator) present    a. Biotronik placed 2009. b. ICD discharge 2011, 03/2013.  [name] Alcohol abuse   . Anxiety   . Arterial embolism (HCC)    a. H/o RLE extremity ischemia in 2012 s/p right iliofemoral embolectomy with compartment fasciotomy.  . Asthma   . CAD (coronary artery disease)    a. 1996: anterior wall MI tx with PTCA. b. 2003: PCI/DES to circumflex marginal. c. 2004: PTCA/CBA/stenting of ostium of D1 and LAD. d. 06/2003: s/p PTCA/DES of mRCA. e. 11/2003: CBA of bifurcation diagonal LAD. f. 2007: PTCA/DES to RCA, CBA/stenting to OM2.  . Chronic systolic heart failure (HCC)    a.  secondary to severe ischemic cardiomyopathy with EF 20-25%;  b. s/p rimplantation of Biotronik single chamber ICD by Dr Ladona Ridgel 02/2008;  c.  RHC 07/2013 showed well compensated left sided pressures with moderate PAH and low cardiac output. d. 2D Echo 03/2014: EF 20-25%, grade 2 DD, PAP .  . CKD (chronic kidney disease), stage II   . COPD (chronic obstructive pulmonary disease) (HCC)   .  Diabetes mellitus, type 2 (HCC)   . Dizziness   . DVT (deep venous thrombosis) (HCC)    a. 06/2010.  [name] GERD (gastroesophageal reflux disease)   . History of noncompliance with medical treatment   . HLD (hyperlipidemia)   . LV (left ventricular) mural thrombus    a. Dx on echo 03/2014.  [name] Myocardial infarction   . Pulmonary hypertension   . Sleep apnea   . Tobacco abuse   . V-tach [facility])     Patient Active Problem List   Diagnosis Date Noted  . CAD (coronary artery disease) 01/15/2015  . Impetigo 01/15/2015  . Acute kidney injury superimposed on chronic kidney disease (HCC) 01/15/2015  . Hyponatremia 01/15/2015  . Hypomagnesemia 01/15/2015  . Diabetes mellitus type 2, uncontrolled (HCC) 01/15/2015  . Chronic systolic CHF (congestive heart failure) (HCC) 05/05/2014  . Ischemic cardiomyopathy 05/05/2014  . COPD (chronic obstructive pulmonary disease) (HCC) 05/05/2014  . Encounter for therapeutic drug monitoring 04/28/2014  . LV (left ventricular) mural thrombus (HCC) 03/25/2014  . Rheumatoid factor positive 10/26/2013  . OSA (obstructive sleep apnea) 09/03/2013  . Pulmonary HTN (HCC) 09/03/2013  . PAD (peripheral artery disease) (HCC) 10/06/2012  . Chest pain 07/03/2012  . VT (ventricular tachycardia) (HCC) 12/20/2011  . Depression with anxiety 04/30/2011  . Rash 08/24/2010  . Deep vein thrombosis of lower leg (HCC) 06/28/2010  . HYPOPOTASSEMIA 12/24/2008  . HYPERCHOLESTEROLEMIA 07/15/2008  .  NICOTINE ADDICTION 07/15/2008  . DYSPNEA 03/21/2008  . NONSPECIFIC LOW BP 03/21/2008  . ICD (implantable cardioverter-defibrillator) in place 03/21/2008    Past Surgical History:  Procedure Laterality Date  . CARDIAC CATHETERIZATION    . DIAGNOSTIC LAPAROSCOPY    . INSERT / REPLACE / REMOVE PACEMAKER    . RIGHT HEART CATHETERIZATION N/A 07/23/2013   Procedure: RIGHT HEART CATH;  Surgeon: Dolores Patty, MD;  Location[facility] C[facility] LAB;  Service: Cardiovascular;  Laterality: N/A;  .  TUBAL LIGATION      OB History    No data available       Home Medications    Prior to Admission medications   Medication Sig Start Date End Date Taking? Authorizing Provider  acetaminophen (TYLENOL) 500 MG tablet Take 1 tablet (500 mg total) by mouth every 6 (six) hours as needed. 05/23/16   Antony Madura, PA-C  albuterol (PROVENTIL HFA;VENTOLIN HFA) 108 (90 BASE) MCG/ACT inhaler Inhale 2 puffs into the lungs every 6 (six) hours as needed for wheezing or shortness of breath. 10/26/13   Lupita Leash, MD  albuterol (PROVENTIL) (2.5 MG/3ML) 0.083% nebulizer solution Take 2.5 mg by nebulization every 6 (six) hours as needed for wheezing or shortness of breath.    Historical Provider, MD  Alum & Mag Hydroxide-Simeth (GI COCKTAIL) SUSP suspension Take 15 mLs by mouth 2 (two) times daily. Shake well. 06/07/16 06/11/16  Pricilla Loveless, MD  digoxin (LANOXIN) 0.25 MG tablet Take 0.5 tablets (0.125 mg total) by mouth daily. 02/02/15   Dolores Patty, MD  ethacrynic acid (EDECRIN) 25 MG tablet Take 6 tablets (150 mg total) by mouth 2 (two) times daily. 06/06/16   Dolores Patty, MD  magnesium oxide (MAG-OX) 400 (241.3 Mg) MG tablet TAKE 1/2 TABLET (200MG ) EVERY DAY 01/30/16   Dolores Patty, MD  metolazone (ZAROXOLYN) 2.5 MG tablet Take once daily as needed (3 x max per week) for weight gain/swelling. Take extra 20 meq potassium pill with this. Patient not taking: Reported on 06/05/2016 09/28/15   Laurey Morale, MD  pantoprazole (PROTONIX) 40 MG tablet Take 1 tablet (40 mg total) by mouth 2 (two) times daily. 06/07/16   Pricilla Loveless, MD  potassium chloride SA (KLOR-CON M20) 20 MEQ tablet Take 40 mg (2 tabs) in am and 20 meq (1 tab) in pm 11/28/15   Dolores Patty, MD  pravastatin (PRAVACHOL) 40 MG tablet Take 1 tablet (40 mg total) by mouth every evening. 01/11/15   Laurey Morale, MD  sildenafil (REVATIO) 20 MG tablet Take 2 tablets (40 mg total) by mouth 3 (three) times daily. 11/03/15    Dolores Patty, MD  spironolactone (ALDACTONE) 25 MG tablet TAKE 1 TABLET (25 MG TOTAL) BY MOUTH DAILY. 02/28/16   Amy D Filbert Schilder, NP  sulfamethoxazole-trimethoprim (BACTRIM DS,SEPTRA DS) 800-160 MG tablet Take 1 tablet by mouth 2 (two) times daily. 06/07/16 06/14/16  Pricilla Loveless, MD  traMADol (ULTRAM) 50 MG tablet Take 1 tablet (50 mg total) by mouth every 6 (six) hours as needed. 06/05/16   Little Ishikawa, NP  warfarin (COUMADIN) 5 MG tablet Take As Directed by Coumadin Clinic 09/28/15   Dolores Patty, MD    Family History Family History  Problem Relation Age of Onset  . Coronary artery disease      FMILY HISTORY    Social History Social History  Substance Use Topics  . Smoking status: Current Some Day Smoker    Packs/day: 0.25  Years: 21.00    Types: Cigars  . Smokeless tobacco: Never Used     Comment: Smokes 2-3 cigars daily, quit smoking cigarettes 3 yrs ago.   . Alcohol use No     Allergies   Nyquil multi-symptom [pseudoeph-doxylamine-dm-apap]; Sudafed [pseudoephedrine]; Zocor [simvastatin]; Latex; and Ramipril   Review of Systems Review of Systems  Constitutional: Negative for fever.  HENT: Positive for sore throat and trouble swallowing (painful).   Respiratory: Positive for shortness of breath.   Cardiovascular: Negative for chest pain.  Gastrointestinal: Negative for vomiting.  All other systems reviewed and are negative.    Physical Exam Updated Vital Signs BP 121/78   Pulse 90   Temp 97.9 F (36.6 C)   Resp 17   Ht 5\' 5"  (1.651 m)   Wt 130 lb (59 kg)   SpO2 97%   BMI 21.63 kg/m   Physical Exam  Constitutional: She is oriented to person, place, and time. She appears well-developed and well-nourished.  HENT:  Head: Normocephalic and atraumatic.  Right Ear: External ear normal.  Left Ear: External ear normal.  Nose: Nose normal.  Mouth/Throat: Oropharynx is clear and moist. No oropharyngeal exudate.  Eyes: Right eye exhibits no discharge.  Left eye exhibits no discharge.  Neck: Normal range of motion. Neck supple.  Superficial skin wound to posterior neck with mild drainage. Very similar to picture in ED chart on 05/23/16. No anterior neck swelling or tenderness  Cardiovascular: Normal rate, regular rhythm and normal heart sounds.   Pulmonary/Chest: Effort normal and breath sounds normal. No stridor. She has no wheezes.  Normal voice. No increased work of breathing  Abdominal: She exhibits no distension.  Neurological: She is alert and oriented to person, place, and time.  Skin: Skin is warm and dry.  Nursing note and vitals reviewed.    ED Treatments / Results  Labs (all labs ordered are listed, but only abnormal results are displayed) Labs Reviewed - No data to display  EKG  EKG Interpretation None       Radiology Dg Neck Soft Tissue  Result Date: 06/07/2016 CLINICAL DATA:  Foreign body sensation. Patient feels like stack Celsius segmental is stuck in right neck EXAM: NECK SOFT TISSUES - 1+ VIEW COMPARISON:  None. FINDINGS: There is no evidence of retropharyngeal soft tissue swelling or epiglottic enlargement. The cervical airway is unremarkable. No radio-opaque foreign body. Carotid calcifications noted on the left. IMPRESSION: No evidence of radiopaque foreign body. Electronically Signed   By: Rubye OaksMelanie  Ehinger M.D.   On: 06/07/2016 06:49    Procedures Procedures (including critical care time)  Medications Ordered in ED Medications  gi cocktail (Maalox,Lidocaine,Donnatal) (30 mLs Oral Given 06/07/16 [id number])     Initial Impression / Assessment and Plan / ED Course  I have reviewed the triage vital signs and the nursing notes.  Pertinent labs & imaging results that were available during my care of the patient were reviewed by me and considered in my medical decision making (see chart for details).     X-ray unremarkable. Patient feels significant better after GI cocktail. Shortness of breath and pain are  gone all her she still feels like she can feel a pill in her throat. Even she is able to tolerate oral fluids and food, she is stable for discharge. I discussed with GI, Dr. Christella HartiganJacobs, who recommends a PPI twice per day for 1 week. GI cocktail 2 times a day for 4-5 days. Also advises taking off of doxycycline. Given she is  noncompliant with her Coumadin, and this does not appear to be a new thing, will place on Bactrim as there should be no interaction. Appears stable currently. No respiratory symptoms at this time. At this point, follow up with GI in 5 days if not improving. Strict return precautions if anything worsens.  Final Clinical Impressions(s) / ED Diagnoses   Final diagnoses:  Pill esophagitis    New Prescriptions New Prescriptions   ALUM & MAG HYDROXIDE-SIMETH (GI COCKTAIL) SUSP SUSPENSION    Take 15 mLs by mouth 2 (two) times daily. Shake well.   PANTOPRAZOLE (PROTONIX) 40 MG TABLET    Take 1 tablet (40 mg total) by mouth 2 (two) times daily.   SULFAMETHOXAZOLE-TRIMETHOPRIM (BACTRIM DS,SEPTRA DS) 800-160 MG TABLET    Take 1 tablet by mouth 2 (two) times daily.     Pricilla Loveless, MD 06/07/16 [id number]    Pricilla Loveless, MD 06/07/16 2257

## 2016-06-08 ENCOUNTER — Telehealth: Payer: Self-pay | Admitting: *Deleted

## 2016-06-08 NOTE — Telephone Encounter (Signed)
Attempted to call patient x 1--patient's phone is disconnected.   Called son, Jonny Ruiz, about patient's device reaching ERI. I asked him to have his mother to call back about scheduling an appt with Dr.Taylor to discuss gen change. Son verbalized understanding.

## 2016-06-11 ENCOUNTER — Ambulatory Visit (HOSPITAL_COMMUNITY)
Admission: RE | Admit: 2016-06-11 | Discharge: 2016-06-11 | Disposition: A | Discharge status: Home / Self Care | Payer: Medicare Other | Source: Ambulatory Visit | Attending: Internal Medicine | Admitting: Internal Medicine

## 2016-06-11 ENCOUNTER — Telehealth: Payer: Self-pay | Admitting: Internal Medicine

## 2016-06-11 DIAGNOSIS — T814XXD Infection following a procedure, subsequent encounter: Principal | ICD-10-CM

## 2016-06-11 DIAGNOSIS — IMO0001 Reserved for inherently not codable concepts without codable children: Secondary | ICD-10-CM

## 2016-06-11 NOTE — Telephone Encounter (Signed)
New Message:    Pt says she is calling to schedule an appointment to have her Defibrillator replaced.

## 2016-06-11 NOTE — Patient Instructions (Signed)
Follow-up next week.

## 2016-06-11 NOTE — Progress Notes (Signed)
   Follow up visit for wound check on the back of her neck. Partial thickness wound 8x7 cm. 100% pink min exudate. Nontender.   Continue 10 days of antibiotics. Wound CX with Rare Staph.   Reapplied silicone dressing/Aquacel Ag/Allevyn Foam. Recheck next week.  Daiya Tamer NP-C   3:28 PM

## 2016-06-12 NOTE — Telephone Encounter (Signed)
Attempted to call patient about scheduling appt to f/u with GT to discuss gen change.   N/A--vm has not been set up.sss

## 2016-06-14 ENCOUNTER — Encounter (HOSPITAL_COMMUNITY): Payer: Medicare Other

## 2016-06-14 NOTE — Telephone Encounter (Signed)
Spoke to pt and arranged OV w/ Ladona Ridgel on 5/3 to discuss ICD generator change.  (Reached ERI 3/30) Patient verbalized understanding and agreeable to plan.

## 2016-06-19 ENCOUNTER — Inpatient Hospital Stay (HOSPITAL_COMMUNITY): Admission: RE | Admit: 2016-06-19 | Payer: Medicare Other | Source: Ambulatory Visit

## 2016-06-19 ENCOUNTER — Ambulatory Visit (HOSPITAL_COMMUNITY)
Admission: RE | Admit: 2016-06-19 | Discharge: 2016-06-19 | Disposition: A | Discharge status: Home / Self Care | Payer: Medicare Other | Source: Ambulatory Visit | Attending: Cardiology | Admitting: Cardiology

## 2016-06-19 DIAGNOSIS — Z4800 Encounter for change or removal of nonsurgical wound dressing: Secondary | ICD-10-CM | POA: Insufficient documentation

## 2016-06-19 DIAGNOSIS — S1180XA Unspecified open wound of other specified part of neck, initial encounter: Secondary | ICD-10-CM | POA: Insufficient documentation

## 2016-06-26 ENCOUNTER — Encounter: Payer: Self-pay | Admitting: Internal Medicine

## 2016-06-28 ENCOUNTER — Inpatient Hospital Stay (HOSPITAL_COMMUNITY): Admission: RE | Admit: 2016-06-28 | Payer: Medicare Other | Source: Ambulatory Visit

## 2016-07-02 ENCOUNTER — Ambulatory Visit (HOSPITAL_COMMUNITY)
Admission: RE | Admit: 2016-07-02 | Discharge: 2016-07-02 | Disposition: A | Discharge status: Home / Self Care | Payer: Medicare Other | Source: Ambulatory Visit | Attending: Internal Medicine | Admitting: Internal Medicine

## 2016-07-02 DIAGNOSIS — T814XXD Infection following a procedure, subsequent encounter: Secondary | ICD-10-CM

## 2016-07-02 DIAGNOSIS — Z029 Encounter for administrative examinations, unspecified: Secondary | ICD-10-CM | POA: Diagnosis not present

## 2016-07-02 DIAGNOSIS — IMO0001 Reserved for inherently not codable concepts without codable children: Secondary | ICD-10-CM

## 2016-07-12 ENCOUNTER — Encounter: Payer: Self-pay | Admitting: Internal Medicine

## 2016-07-12 ENCOUNTER — Ambulatory Visit (INDEPENDENT_AMBULATORY_CARE_PROVIDER_SITE_OTHER): Payer: Medicare Other | Admitting: Internal Medicine

## 2016-07-12 VITALS — BP 92/66 | HR 82 | Ht 65.0 in | Wt 121.6 lb

## 2016-07-12 DIAGNOSIS — I472 Ventricular tachycardia, unspecified: Secondary | ICD-10-CM

## 2016-07-12 NOTE — Patient Instructions (Addendum)
Medication Instructions:  Your physician recommends that you continue on your current medications as directed. Please refer to the Current Medication list given to you today.   Labwork: LABS TODAY: CBC, BMET, PT/INR  Testing/Procedures: Dr. Ladona Ridgelaylor wants you to have an ICD generator change on 07/20/16.  Follow-Up: Follow-up for a wound check in the Device [facility] 10-14 days from date of procedure and with Dr. Ladona Ridgelaylor 91 days after generator change.    Any Other Special Instructions Will Be Listed Below ---  Please wash with the CHG Soap the night before and morning of procedure (follow instruction page "Preparing For Surgery").      If you need a refill on your cardiac medications before your next appointment, please call your pharmacy.

## 2016-07-12 NOTE — Progress Notes (Signed)
HPI [name] returns today for followup. She is a very pleasant 56 year old woman with an ischemic cardiomyopathy, chronic systolic heart failure , status post ICD implantation almost 9 years ago.  She had an appropriate ICD shock for very rapid ventricular tachycardia in the past but none in the past year. In the interim, she has been stable with class III heart failure. She denies chest pain. She has minimal peripheral edema. She has tried to adhere to a low-sodium diet. She denies medical noncompliance. No recent ICD discharges. She has developed pulmonary hypertension and is on Sildenafil. She does have some dizziness. She has continued to lose weight. She developed an unusual reaction to her diuretics and has had to take Ethacrynic acid. She has had edema in the feet. Allergies  Allergen Reactions  . Nyquil Multi-Symptom [Pseudoeph-Doxylamine-Dm-Apap] Other (See Comments)    Fatigue, chest pressure and pain  . Sudafed [Pseudoephedrine] Other (See Comments)    Fatigue, chest pressure and pain  . Lasix [Furosemide]     Rash  . Torsemide     Rash  . Zocor [Simvastatin]     Myalgias   . Latex Rash  . Ramipril Cough     Current Outpatient Prescriptions  Medication Sig Dispense Refill  . acetaminophen (TYLENOL) 500 MG tablet Take 500 mg by mouth every 6 (six) hours as needed (pain).    [name] albuterol (PROVENTIL HFA;VENTOLIN HFA) 108 (90 BASE) MCG/ACT inhaler Inhale 2 puffs into the lungs every 6 (six) hours as needed for wheezing or shortness of breath. 1 Inhaler 2  . albuterol (PROVENTIL) (2.5 MG/3ML) 0.083% nebulizer solution Take 2.5 mg by nebulization every 6 (six) hours as needed for wheezing or shortness of breath.    [name] aspirin EC 81 MG tablet Take 81 mg by mouth daily.    . digoxin (LANOXIN) 0.25 MG tablet Take 0.5 tablets (0.125 mg total) by mouth daily. 30 tablet 5  . ethacrynic acid (EDECRIN) 25 MG tablet Take 6 tablets (150 mg total) by mouth 2 (two) times daily. 360 tablet 6   . magnesium oxide (MAG-OX) 400 (241.3 Mg) MG tablet TAKE 1/2 TABLET (200MG ) EVERY DAY 15 tablet 3  . metolazone (ZAROXOLYN) 2.5 MG tablet Take once daily as needed (3 x max per week) for weight gain/swelling. Take extra 20 meq potassium pill with this. 15 tablet 6  . potassium chloride SA (KLOR-CON M20) 20 MEQ tablet Take 40 mg (2 tabs) in am and 20 meq (1 tab) in pm 90 tablet 1  . sildenafil (REVATIO) 20 MG tablet Take 2 tablets (40 mg total) by mouth 3 (three) times daily. 360 tablet 6  . spironolactone (ALDACTONE) 25 MG tablet TAKE 1 TABLET (25 MG TOTAL) BY MOUTH DAILY. 30 tablet 3   No current facility-administered medications for this visit.      Past Medical History:  Diagnosis Date  . AICD (automatic cardioverter/defibrillator) present    a. Biotronik placed 2009. b. ICD discharge 2011, 03/2013.  [name] Alcohol abuse   . Anxiety   . Arterial embolism (HCC)    a. H/o RLE extremity ischemia in 2012 s/p right iliofemoral embolectomy with compartment fasciotomy.  . Asthma   . CAD (coronary artery disease)    a. 1996: anterior wall MI tx with PTCA. b. 2003: PCI/DES to circumflex marginal. c. 2004: PTCA/CBA/stenting of ostium of D1 and LAD. d. 06/2003: s/p PTCA/DES of mRCA. e. 11/2003: CBA of bifurcation diagonal LAD. f. 2007: PTCA/DES to RCA, CBA/stenting to OM2.  . Chronic  systolic heart failure (HCC)    a.  secondary to severe ischemic cardiomyopathy with EF 20-25%;  b. s/p rimplantation of Biotronik single chamber ICD by Dr Ladona Ridgelaylor 02/2008;  c.  RHC 07/2013 showed well compensated left sided pressures with moderate PAH and low cardiac output. d. 2D Echo 03/2014: EF 20-25%, grade 2 DD, PAP 58mmHg.  . CKD (chronic kidney disease), stage II   . COPD (chronic obstructive pulmonary disease) (HCC)   . Diabetes mellitus, type 2 (HCC)   . Dizziness   . DVT (deep venous thrombosis) (HCC)    a. 06/2010.  [name]. GERD (gastroesophageal reflux disease)   . History of noncompliance with medical treatment   .  HLD (hyperlipidemia)   . LV (left ventricular) mural thrombus    a. Dx on echo 03/2014.  [name]. Myocardial infarction (HCC)   . Pulmonary hypertension (HCC)   . Sleep apnea   . Tobacco abuse   . V-tach (HCC)     ROS:   All systems reviewed and negative except as noted in the HPI.   Past Surgical History:  Procedure Laterality Date  . CARDIAC CATHETERIZATION    . DIAGNOSTIC LAPAROSCOPY    . INSERT / REPLACE / REMOVE PACEMAKER    . RIGHT HEART CATHETERIZATION N/A 07/23/2013   Procedure: RIGHT HEART CATH;  Surgeon: Dolores Pattyaniel R Bensimhon, MD;  [facility];  Service: Cardiovascular;  Laterality: N/A;  . TUBAL LIGATION       Family History  Problem Relation Age of Onset  . Coronary artery disease      FMILY HISTORY     Social History   Social History  . Marital status: Divorced    Spouse name: N/A  . Number of children: N/A  . Years of education: N/A   Occupational History  . disabled Disability   Social History Main Topics  . Smoking status: Current Some Day Smoker    Packs/day: 0.25    Years: 21.00    Types: Cigars  . Smokeless tobacco: Never Used     Comment: Smokes 2-3 cigars daily, quit smoking cigarettes 3 yrs ago.   . Alcohol use No  . Drug use: No  . Sexual activity: No   Other Topics Concern  . Not on file   Social History Narrative  . No narrative on file     BP 92/66   Pulse 82   Ht 5\' 5"  (1.651 m)   Wt 121 lb 9.6 oz (55.2 kg)   SpO2 95%   BMI 20.24 kg/m   Physical Exam:  stable appearing 56 year old woman,NAD HEENT: Unremarkable Neck:  7 cm JVD, no thyromegally Back:  No CVA tenderness Lungs:  Clear with no wheezes, rales, or rhonchi. HEART:  Regular rate rhythm, no murmurs, no rubs, no clicks Abd:  soft, positive bowel sounds, no organomegally, no rebound, no guarding Ext:  2 plus pulses, no edema, no cyanosis, no clubbing Skin:  No rashes no nodules Neuro:  CN II through XII intact, motor grossly intact  DEVICE  Normal  device function.  See PaceArt for details. [facility]   Assess/Plan: 1. VT - she has had no additional VT since I saw her last. She will continue her current meds. 2. ICD - her device has reached ERI. Will schedule ICD generator change out. 3. Chronic systolic heart failure - her symptoms remain class 3. She will continue her current meds. 4. Abscess - she appears to be healing nicely. She still has  an area on the back of her nec with granulation tissue. Hopefully this will continue to heal.  Leonia Reeves.D.

## 2016-07-13 LAB — BASIC METABOLIC PANEL
BUN/Creatinine Ratio: 22 (ref 9–23)
BUN: 32 mg/dL — AB (ref 6–24)
CALCIUM: 9.5 mg/dL (ref 8.7–10.2)
CO2: 27 mmol/L (ref 18–29)
CREATININE: 1.48 mg/dL — AB (ref 0.57–1.00)
Chloride: 93 mmol/L — ABNORMAL LOW (ref 96–106)
GFR calc Af Amer: 45 mL/min/{1.73_m2} — ABNORMAL LOW (ref >59)
GFR, EST NON AFRICAN AMERICAN: 39 mL/min/{1.73_m2} — AB (ref >59)
Glucose: 132 mg/dL — ABNORMAL HIGH (ref 65–99)
Potassium: 4.1 mmol/L (ref 3.5–5.2)
Sodium: 137 mmol/L (ref 134–144)

## 2016-07-13 LAB — CBC
HEMATOCRIT: 39.9 % (ref 34.0–46.6)
HEMOGLOBIN: 13.9 g/dL (ref 11.1–15.9)
MCH: 28.8 pg (ref 26.6–33.0)
MCHC: 34.8 g/dL (ref 31.5–35.7)
MCV: 83 fL (ref 79–97)
Platelets: 159 10*3/uL (ref 150–379)
RBC: 4.83 x10E6/uL (ref 3.77–5.28)
RDW: 16.8 % — AB (ref 12.3–15.4)
WBC: 4.2 10*3/uL (ref 3.4–10.8)

## 2016-07-13 LAB — CUP PACEART INCLINIC DEVICE CHECK
Date Time Interrogation Session: 20180504164020
Implantable Lead Model: 363
Implantable Lead Serial Number: 10319091
MDC IDC LEAD IMPLANT DT: 20091208
MDC IDC LEAD LOCATION: 753860
MDC IDC PG IMPLANT DT: 20091208
MDC IDC PG SERIAL: 60439356

## 2016-07-13 LAB — PROTIME-INR
INR: 1.1 (ref 0.8–1.2)
Prothrombin Time: 11.2 s (ref 9.1–12.0)

## 2016-07-17 ENCOUNTER — Encounter (HOSPITAL_COMMUNITY): Payer: Medicare Other

## 2016-07-20 ENCOUNTER — Encounter (HOSPITAL_COMMUNITY): Payer: Self-pay | Admitting: *Deleted

## 2016-07-20 ENCOUNTER — Encounter (HOSPITAL_COMMUNITY)
Admission: RE | Disposition: A | Discharge status: Home / Self Care | Payer: Self-pay | Source: Ambulatory Visit | Attending: Internal Medicine

## 2016-07-20 ENCOUNTER — Ambulatory Visit (HOSPITAL_COMMUNITY)
Admission: RE | Admit: 2016-07-20 | Discharge: 2016-07-21 | Disposition: A | Discharge status: Home / Self Care | Payer: Medicare Other | Source: Ambulatory Visit | Attending: Internal Medicine | Admitting: Internal Medicine

## 2016-07-20 DIAGNOSIS — J449 Chronic obstructive pulmonary disease, unspecified: Secondary | ICD-10-CM | POA: Diagnosis not present

## 2016-07-20 DIAGNOSIS — Z95810 Presence of automatic (implantable) cardiac defibrillator: Secondary | ICD-10-CM

## 2016-07-20 DIAGNOSIS — E1122 Type 2 diabetes mellitus with diabetic chronic kidney disease: Secondary | ICD-10-CM | POA: Insufficient documentation

## 2016-07-20 DIAGNOSIS — F419 Anxiety disorder, unspecified: Secondary | ICD-10-CM | POA: Diagnosis not present

## 2016-07-20 DIAGNOSIS — Z955 Presence of coronary angioplasty implant and graft: Secondary | ICD-10-CM | POA: Diagnosis not present

## 2016-07-20 DIAGNOSIS — N182 Chronic kidney disease, stage 2 (mild): Secondary | ICD-10-CM | POA: Diagnosis not present

## 2016-07-20 DIAGNOSIS — Z4502 Encounter for adjustment and management of automatic implantable cardiac defibrillator: Secondary | ICD-10-CM | POA: Insufficient documentation

## 2016-07-20 DIAGNOSIS — K219 Gastro-esophageal reflux disease without esophagitis: Secondary | ICD-10-CM | POA: Diagnosis not present

## 2016-07-20 DIAGNOSIS — I5022 Chronic systolic (congestive) heart failure: Secondary | ICD-10-CM | POA: Diagnosis not present

## 2016-07-20 DIAGNOSIS — F17290 Nicotine dependence, other tobacco product, uncomplicated: Secondary | ICD-10-CM | POA: Insufficient documentation

## 2016-07-20 DIAGNOSIS — I472 Ventricular tachycardia, unspecified: Secondary | ICD-10-CM

## 2016-07-20 DIAGNOSIS — I272 Pulmonary hypertension, unspecified: Secondary | ICD-10-CM | POA: Insufficient documentation

## 2016-07-20 DIAGNOSIS — Z91040 Latex allergy status: Secondary | ICD-10-CM | POA: Diagnosis not present

## 2016-07-20 DIAGNOSIS — I255 Ischemic cardiomyopathy: Secondary | ICD-10-CM | POA: Insufficient documentation

## 2016-07-20 DIAGNOSIS — T82110A Breakdown (mechanical) of cardiac electrode, initial encounter: Secondary | ICD-10-CM | POA: Diagnosis not present

## 2016-07-20 DIAGNOSIS — E785 Hyperlipidemia, unspecified: Secondary | ICD-10-CM | POA: Insufficient documentation

## 2016-07-20 DIAGNOSIS — I252 Old myocardial infarction: Secondary | ICD-10-CM | POA: Diagnosis not present

## 2016-07-20 DIAGNOSIS — L0291 Cutaneous abscess, unspecified: Secondary | ICD-10-CM | POA: Insufficient documentation

## 2016-07-20 DIAGNOSIS — Z86718 Personal history of other venous thrombosis and embolism: Secondary | ICD-10-CM | POA: Diagnosis not present

## 2016-07-20 DIAGNOSIS — G4730 Sleep apnea, unspecified: Secondary | ICD-10-CM | POA: Diagnosis not present

## 2016-07-20 DIAGNOSIS — Z7982 Long term (current) use of aspirin: Secondary | ICD-10-CM | POA: Insufficient documentation

## 2016-07-20 DIAGNOSIS — Y713 Surgical instruments, materials and cardiovascular devices (including sutures) associated with adverse incidents: Secondary | ICD-10-CM | POA: Insufficient documentation

## 2016-07-20 DIAGNOSIS — I2510 Atherosclerotic heart disease of native coronary artery without angina pectoris: Secondary | ICD-10-CM | POA: Diagnosis not present

## 2016-07-20 HISTORY — PX: ICD GENERATOR CHANGEOUT: EP1231

## 2016-07-20 LAB — SURGICAL PCR SCREEN
MRSA, PCR: NEGATIVE
STAPHYLOCOCCUS AUREUS: POSITIVE — AB

## 2016-07-20 LAB — GLUCOSE, CAPILLARY: GLUCOSE-CAPILLARY: 128 mg/dL — AB (ref 65–99)

## 2016-07-20 SURGERY — ICD GENERATOR CHANGEOUT

## 2016-07-20 MED ORDER — LIDOCAINE HCL (PF) 1 % IJ SOLN
INTRAMUSCULAR | Status: AC
  Filled 2016-07-20: qty 60

## 2016-07-20 MED ORDER — HEPARIN (PORCINE) IN NACL 2-0.9 UNIT/ML-% IJ SOLN
INTRAMUSCULAR | Status: DC | PRN
  Administered 2016-07-20: 12:00:00

## 2016-07-20 MED ORDER — FENTANYL CITRATE (PF) 100 MCG/2ML IJ SOLN
INTRAMUSCULAR | Status: AC
  Filled 2016-07-20: qty 2

## 2016-07-20 MED ORDER — LIDOCAINE HCL (PF) 1 % IJ SOLN
INTRAMUSCULAR | Status: DC | PRN
  Administered 2016-07-20: 60 mL

## 2016-07-20 MED ORDER — ALBUTEROL SULFATE (2.5 MG/3ML) 0.083% IN NEBU: 2.5000 mg | INHALATION_SOLUTION | Freq: Four times a day (QID) | RESPIRATORY_TRACT | Status: DC | PRN

## 2016-07-20 MED ORDER — SPIRONOLACTONE 12.5 MG HALF TABLET
12.5000 mg | ORAL_TABLET | Freq: Every day | ORAL | Status: DC
  Administered 2016-07-20 – 2016-07-21 (×2): 12.5 mg via ORAL
  Filled 2016-07-20 (×2): qty 1

## 2016-07-20 MED ORDER — MIDAZOLAM HCL 5 MG/5ML IJ SOLN
INTRAMUSCULAR | Status: DC | PRN
Start: 2016-07-20 — End: 2016-07-20
  Administered 2016-07-20: 1 mg via INTRAVENOUS
  Administered 2016-07-20: 2 mg via INTRAVENOUS
  Administered 2016-07-20 (×7): 1 mg via INTRAVENOUS

## 2016-07-20 MED ORDER — METOLAZONE 2.5 MG PO TABS
2.5000 mg | ORAL_TABLET | Freq: Every day | ORAL | Status: DC
  Administered 2016-07-20 – 2016-07-21 (×2): 2.5 mg via ORAL
  Filled 2016-07-20 (×2): qty 1

## 2016-07-20 MED ORDER — MUPIROCIN 2 % EX OINT
TOPICAL_OINTMENT | CUTANEOUS | Status: AC
  Administered 2016-07-20: 1 via TOPICAL
  Filled 2016-07-20: qty 22

## 2016-07-20 MED ORDER — POTASSIUM CHLORIDE CRYS ER 20 MEQ PO TBCR
20.0000 meq | EXTENDED_RELEASE_TABLET | Freq: Every day | ORAL | Status: DC
  Administered 2016-07-20 – 2016-07-21 (×2): 20 meq via ORAL
  Filled 2016-07-20 (×2): qty 1

## 2016-07-20 MED ORDER — CEFAZOLIN SODIUM-DEXTROSE 2-4 GM/100ML-% IV SOLN
2.0000 g | INTRAVENOUS | Status: AC
  Administered 2016-07-20: 2 g via INTRAVENOUS

## 2016-07-20 MED ORDER — ACETAMINOPHEN 500 MG PO TABS
1000.0000 mg | ORAL_TABLET | Freq: Four times a day (QID) | ORAL | Status: DC | PRN
  Administered 2016-07-20 – 2016-07-21 (×4): 1000 mg via ORAL
  Filled 2016-07-20 (×4): qty 2

## 2016-07-20 MED ORDER — MIDAZOLAM HCL 5 MG/5ML IJ SOLN
INTRAMUSCULAR | Status: AC
  Filled 2016-07-20: qty 5

## 2016-07-20 MED ORDER — SODIUM CHLORIDE 0.9 % IR SOLN
Status: DC | PRN
  Administered 2016-07-20: 12:00:00

## 2016-07-20 MED ORDER — ACETAMINOPHEN 325 MG PO TABS: 325.0000 mg | ORAL_TABLET | ORAL | Status: DC | PRN

## 2016-07-20 MED ORDER — SODIUM CHLORIDE 0.9 % IR SOLN
80.0000 mg | Status: AC
  Administered 2016-07-20: 80 mg

## 2016-07-20 MED ORDER — GENTAMICIN SULFATE 40 MG/ML IJ SOLN
INTRAMUSCULAR | Status: AC
  Filled 2016-07-20: qty 2

## 2016-07-20 MED ORDER — FENTANYL CITRATE (PF) 100 MCG/2ML IJ SOLN
INTRAMUSCULAR | Status: DC | PRN
  Administered 2016-07-20 (×3): 12.5 ug via INTRAVENOUS
  Administered 2016-07-20: 25 ug via INTRAVENOUS
  Administered 2016-07-20 (×3): 12.5 ug via INTRAVENOUS
  Administered 2016-07-20: 25 ug via INTRAVENOUS

## 2016-07-20 MED ORDER — CEFAZOLIN SODIUM-DEXTROSE 2-4 GM/100ML-% IV SOLN
INTRAVENOUS | Status: AC
  Filled 2016-07-20: qty 100

## 2016-07-20 MED ORDER — ONDANSETRON HCL 4 MG/2ML IJ SOLN: 4.0000 mg | Freq: Four times a day (QID) | INTRAMUSCULAR | Status: DC | PRN

## 2016-07-20 MED ORDER — CEFAZOLIN SODIUM-DEXTROSE 1-4 GM/50ML-% IV SOLN
1.0000 g | Freq: Four times a day (QID) | INTRAVENOUS | Status: AC
  Administered 2016-07-20 – 2016-07-21 (×3): 1 g via INTRAVENOUS
  Filled 2016-07-20 (×3): qty 50

## 2016-07-20 MED ORDER — SILDENAFIL CITRATE 20 MG PO TABS
20.0000 mg | ORAL_TABLET | Freq: Three times a day (TID) | ORAL | Status: DC
  Administered 2016-07-20 – 2016-07-21 (×3): 20 mg via ORAL
  Filled 2016-07-20 (×4): qty 1

## 2016-07-20 MED ORDER — ASPIRIN EC 81 MG PO TBEC
81.0000 mg | DELAYED_RELEASE_TABLET | Freq: Every evening | ORAL | Status: DC
  Administered 2016-07-20: 81 mg via ORAL
  Filled 2016-07-20: qty 1

## 2016-07-20 MED ORDER — ETHACRYNIC ACID 25 MG PO TABS
150.0000 mg | ORAL_TABLET | Freq: Two times a day (BID) | ORAL | Status: DC
  Administered 2016-07-20 – 2016-07-21 (×3): 150 mg via ORAL
  Filled 2016-07-20 (×3): qty 6

## 2016-07-20 MED ORDER — CHLORHEXIDINE GLUCONATE 4 % EX LIQD
60.0000 mL | Freq: Once | CUTANEOUS | Status: DC
  Filled 2016-07-20: qty 60

## 2016-07-20 MED ORDER — OXYCODONE HCL 5 MG PO TABS
5.0000 mg | ORAL_TABLET | Freq: Four times a day (QID) | ORAL | Status: DC | PRN
  Administered 2016-07-20 – 2016-07-21 (×4): 5 mg via ORAL
  Filled 2016-07-20 (×4): qty 1

## 2016-07-20 MED ORDER — SODIUM CHLORIDE 0.9 % IV SOLN
INTRAVENOUS | Status: DC
  Administered 2016-07-20: 08:00:00 via INTRAVENOUS

## 2016-07-20 MED ORDER — DIGOXIN 125 MCG PO TABS
0.1250 mg | ORAL_TABLET | Freq: Every evening | ORAL | Status: DC
  Administered 2016-07-20: 0.125 mg via ORAL
  Filled 2016-07-20: qty 1

## 2016-07-20 MED ORDER — INSULIN GLARGINE 100 UNIT/ML ~~LOC~~ SOLN
5.0000 [IU] | Freq: Every day | SUBCUTANEOUS | Status: DC | PRN
  Filled 2016-07-20: qty 0.05

## 2016-07-20 MED ORDER — MUPIROCIN 2 % EX OINT
1.0000 "application " | TOPICAL_OINTMENT | Freq: Once | CUTANEOUS | Status: AC
  Administered 2016-07-20: 1 via TOPICAL

## 2016-07-20 MED ORDER — SODIUM CHLORIDE 0.9 % IR SOLN
Status: AC
  Filled 2016-07-20: qty 2

## 2016-07-20 SURGICAL SUPPLY — 7 items
CABLE SURGICAL S-101-97-12 (CABLE) ×2 IMPLANT
GUIDEWIRE ANGLED .035X150CM (WIRE) ×2 IMPLANT
ICD INTICA DX MRI 404633 (ICD Generator) ×2 IMPLANT
LEAD PLEXA S DX 65/15 414005 (Lead) ×2 IMPLANT
PAD DEFIB LIFELINK (PAD) ×2 IMPLANT
SHEATH CLASSIC 9F (SHEATH) ×2 IMPLANT
TRAY PACEMAKER INSERTION (PACKS) ×2 IMPLANT

## 2016-07-20 NOTE — H&P (View-Only) (Signed)
HPI Jessica [name] returns today for followup. She is a very pleasant 56 year old woman with an ischemic cardiomyopathy, chronic systolic heart failure , status post ICD implantation almost 9 years ago.  She had an appropriate ICD shock for very rapid ventricular tachycardia in the past but none in the past year. In the interim, she has been stable with class III heart failure. She denies chest pain. She has minimal peripheral edema. She has tried to adhere to a low-sodium diet. She denies medical noncompliance. No recent ICD discharges. She has developed pulmonary hypertension and is on Sildenafil. She does have some dizziness. She has continued to lose weight. She developed an unusual reaction to her diuretics and has had to take Ethacrynic acid. She has had edema in the feet. Allergies  Allergen Reactions  . Nyquil Multi-Symptom [Pseudoeph-Doxylamine-Dm-Apap] Other (See Comments)    Fatigue, chest pressure and pain  . Sudafed [Pseudoephedrine] Other (See Comments)    Fatigue, chest pressure and pain  . Lasix [Furosemide]     Rash  . Torsemide     Rash  . Zocor [Simvastatin]     Myalgias   . Latex Rash  . Ramipril Cough     Current Outpatient Prescriptions  Medication Sig Dispense Refill  . acetaminophen (TYLENOL) 500 MG tablet Take 500 mg by mouth every 6 (six) hours as needed (pain).    [name] albuterol (PROVENTIL HFA;VENTOLIN HFA) 108 (90 BASE) MCG/ACT inhaler Inhale 2 puffs into the lungs every 6 (six) hours as needed for wheezing or shortness of breath. 1 Inhaler 2  . albuterol (PROVENTIL) (2.5 MG/3ML) 0.083% nebulizer solution Take 2.5 mg by nebulization every 6 (six) hours as needed for wheezing or shortness of breath.    [name] aspirin EC 81 MG tablet Take 81 mg by mouth daily.    . digoxin (LANOXIN) 0.25 MG tablet Take 0.5 tablets (0.125 mg total) by mouth daily. 30 tablet 5  . ethacrynic acid (EDECRIN) 25 MG tablet Take 6 tablets (150 mg total) by mouth 2 (two) times daily. 360 tablet 6   . magnesium oxide (MAG-OX) 400 (241.3 Mg) MG tablet TAKE 1/2 TABLET (200MG ) EVERY DAY 15 tablet 3  . metolazone (ZAROXOLYN) 2.5 MG tablet Take once daily as needed (3 x max per week) for weight gain/swelling. Take extra 20 meq potassium pill with this. 15 tablet 6  . potassium chloride SA (KLOR-CON M20) 20 MEQ tablet Take 40 mg (2 tabs) in am and 20 meq (1 tab) in pm 90 tablet 1  . sildenafil (REVATIO) 20 MG tablet Take 2 tablets (40 mg total) by mouth 3 (three) times daily. 360 tablet 6  . spironolactone (ALDACTONE) 25 MG tablet TAKE 1 TABLET (25 MG TOTAL) BY MOUTH DAILY. 30 tablet 3   No current facility-administered medications for this visit.      Past Medical History:  Diagnosis Date  . AICD (automatic cardioverter/defibrillator) present    a. Biotronik placed 2009. b. ICD discharge 2011, 03/2013.  [name] Alcohol abuse   . Anxiety   . Arterial embolism (HCC)    a. H/o RLE extremity ischemia in 2012 s/p right iliofemoral embolectomy with compartment fasciotomy.  . Asthma   . CAD (coronary artery disease)    a. 1996: anterior wall MI tx with PTCA. b. 2003: PCI/DES to circumflex marginal. c. 2004: PTCA/CBA/stenting of ostium of D1 and LAD. d. 06/2003: s/p PTCA/DES of mRCA. e. 11/2003: CBA of bifurcation diagonal LAD. f. 2007: PTCA/DES to RCA, CBA/stenting to OM2.  . Chronic  systolic heart failure (HCC)    a.  secondary to severe ischemic cardiomyopathy with EF 20-25%;  b. s/p rimplantation of Biotronik single chamber ICD by Dr Ladona Ridgelaylor 02/2008;  c.  RHC 07/2013 showed well compensated left sided pressures with moderate PAH and low cardiac output. d. 2D Echo 03/2014: EF 20-25%, grade 2 DD, PAP 58mmHg.  . CKD (chronic kidney disease), stage II   . COPD (chronic obstructive pulmonary disease) (HCC)   . Diabetes mellitus, type 2 (HCC)   . Dizziness   . DVT (deep venous thrombosis) (HCC)    a. 06/2010.  [name]. GERD (gastroesophageal reflux disease)   . History of noncompliance with medical treatment   .  HLD (hyperlipidemia)   . LV (left ventricular) mural thrombus    a. Dx on echo 03/2014.  [name]. Myocardial infarction (HCC)   . Pulmonary hypertension (HCC)   . Sleep apnea   . Tobacco abuse   . V-tach (HCC)     ROS:   All systems reviewed and negative except as noted in the HPI.   Past Surgical History:  Procedure Laterality Date  . CARDIAC CATHETERIZATION    . DIAGNOSTIC LAPAROSCOPY    . INSERT / REPLACE / REMOVE PACEMAKER    . RIGHT HEART CATHETERIZATION N/A 07/23/2013   Procedure: RIGHT HEART CATH;  Surgeon: Dolores Pattyaniel R Bensimhon, MD;  [facility];  Service: Cardiovascular;  Laterality: N/A;  . TUBAL LIGATION       Family History  Problem Relation Age of Onset  . Coronary artery disease      FMILY HISTORY     Social History   Social History  . Marital status: Divorced    Spouse name: N/A  . Number of children: N/A  . Years of education: N/A   Occupational History  . disabled Disability   Social History Main Topics  . Smoking status: Current Some Day Smoker    Packs/day: 0.25    Years: 21.00    Types: Cigars  . Smokeless tobacco: Never Used     Comment: Smokes 2-3 cigars daily, quit smoking cigarettes 3 yrs ago.   . Alcohol use No  . Drug use: No  . Sexual activity: No   Other Topics Concern  . Not on file   Social History Narrative  . No narrative on file     BP 92/66   Pulse 82   Ht 5\' 5"  (1.651 m)   Wt 121 lb 9.6 oz (55.2 kg)   SpO2 95%   BMI 20.24 kg/m   Physical Exam:  stable appearing 56 year old woman,NAD HEENT: Unremarkable Neck:  7 cm JVD, no thyromegally Back:  No CVA tenderness Lungs:  Clear with no wheezes, rales, or rhonchi. HEART:  Regular rate rhythm, no murmurs, no rubs, no clicks Abd:  soft, positive bowel sounds, no organomegally, no rebound, no guarding Ext:  2 plus pulses, no edema, no cyanosis, no clubbing Skin:  No rashes no nodules Neuro:  CN II through XII intact, motor grossly intact  DEVICE  Normal  device function.  See PaceArt for details. [facility].   Assess/Plan: 1. VT - she has had no additional VT since I saw her last. She will continue her current meds. 2. ICD - her device has reached ERI. Will schedule ICD generator change out. 3. Chronic systolic heart failure - her symptoms remain class 3. She will continue her current meds. 4. Abscess - she appears to be healing nicely. She still has  an area on the back of her nec with granulation tissue. Hopefully this will continue to heal.  Leonia Reeves.D.

## 2016-07-20 NOTE — Discharge Summary (Signed)
ELECTROPHYSIOLOGY PROCEDURE DISCHARGE SUMMARY    Patient ID: Jessica [name],  MRN: [id number], DOB/AGE: 58/24/1962 58 y.o.  Admit date: 07/20/2016 Discharge date: 07/21/16  Primary Care Physician: Patient, No Pcp Per Primary Cardiologist: Dr. Kirke CorinArida CHF: Dr. Gala RomneyBensimhon Electrophysiologist: Dr. Ladona Ridgelaylor  Primary Discharge Diagnosis:  1. ICD battery at ERI 2. RV lead failure  Secondary Discharge Diagnosis:  1. ICM 2. Chronic CHF (systolic) 3. CAD 4. DM 5. CRI (II) 6. COPD   Allergies  Allergen Reactions  . Nyquil Multi-Symptom [Pseudoeph-Doxylamine-Dm-Apap] Other (See Comments)    Fatigue, chest pressure and pain  . Sudafed [Pseudoephedrine] Other (See Comments)    Fatigue, chest pressure and pain  . Lasix [Furosemide]     Rash  . Torsemide     Rash  . Zocor [Simvastatin]     Myalgias   . Latex Rash  . Ramipril Cough     Procedures This Admission:  1. ICD generator change by Dr Ladona Ridgelaylor with RV lead failure at the time of procedure, requiring new RV lead implant.  The patient received a Biotronik single chamber VDD ICD, serial number [id number], and a [employer] single-chamber, VDD defibrillator lead, serial [id number].   DFT's were unsuccessful at 20J., successful at 28J  There were no immediate post procedure complications. 2.  CXR on 07/21/16 demonstrated no pneumothorax status post device implantation.   Brief HPI: Jessica [name] is a 56 y.o. female was seen in the outpatient setting for consideration of ICD generator change noting had reached ERI.   Past medical history is noted above.  Risks, benefits, and alternatives to ICD implantation were reviewed with the patient who wished to proceed.   Hospital Course:  The patient was admitted and underwent ICD generator change, during the procedure, likely when RV lead was being removed from the old generator, it was noted the RV lead had broken requiring new RV lead implant, details as outlined above. She was  monitored on telemetry overnight which demonstrated NSR.  Left chest was without hematoma or ecchymosis.  The new device was placed subpectorally, and will be prescribed oxycodone #20 tabs for pain management. NCCSRS was reviewed; no evidence of opioid over-use. The device was interrogated and found to be functioning normally.  CXR was obtained and demonstrated no pneumothorax status post device implantation.  Wound care, arm mobility, and restrictions were reviewed with the patient.  The patient was examined by Dr. Ladona Ridgelaylor and considered stable for discharge to home.   Plan to hold ASA for 1 week. She will have an incision check in 7-14 days and follow up with Dr. Ladona Ridgelaylor in 3 months.   Physical Exam: Vitals:   07/20/16 1817 07/20/16 2039 07/21/16 0423 07/21/16 0912  BP:  109/74 109/68 108/69  Pulse: 89 89 84 79  Resp:  17 18 18   Temp:  98.3 F (36.8 C) 98.4 F (36.9 C) 98.5 F (36.9 C)  TempSrc:  Oral Oral Oral  SpO2:  98% 100% 99%  Weight:      Height:       Labs:   Lab Results  Component Value Date   WBC 4.2 07/12/2016   HGB 12.9 06/05/2016   HCT 39.9 07/12/2016   MCV 83 07/12/2016   PLT 159 07/12/2016   No results for input(s): NA, K, CL, CO2, BUN, CREATININE, CALCIUM, PROT, BILITOT, ALKPHOS, ALT, AST, GLUCOSE in the last 168 hours.  Invalid input(s): LABALBU  Discharge Medications:  Allergies as of 07/21/2016      Reactions  Nyquil Multi-symptom [pseudoeph-doxylamine-dm-apap] Other (See Comments)   Fatigue, chest pressure and pain   Sudafed [pseudoephedrine] Other (See Comments)   Fatigue, chest pressure and pain   Lasix [furosemide]    Rash   Torsemide    Rash   Zocor [simvastatin]    Myalgias   Latex Rash   Ramipril Cough      Medication List    TAKE these medications   acetaminophen 500 MG tablet Commonly known as:  TYLENOL Take 1,000 mg by mouth every 6 (six) hours as needed (pain).   albuterol (2.5 MG/3ML) 0.083% nebulizer solution Commonly known as:   PROVENTIL Take 2.5 mg by nebulization every 6 (six) hours as needed for wheezing or shortness of breath.   aspirin EC 81 MG tablet Take 1 tablet (81 mg total) by mouth every evening. Resume aspirin 1 week following discharge. What changed:  additional instructions   digoxin 0.125 MG tablet Commonly known as:  LANOXIN Take 0.125 mg by mouth every evening.   ethacrynic acid 25 MG tablet Commonly known as:  EDECRIN Take 6 tablets (150 mg total) by mouth 2 (two) times daily.   insulin glargine 100 UNIT/ML injection Commonly known as:  LANTUS Inject 5 Units into the skin daily as needed (if blood sugar >200).   magnesium oxide 400 (241.3 Mg) MG tablet Commonly known as:  MAG-OX TAKE 1/2 TABLET (200MG ) EVERY DAY What changed:  See the new instructions.   MEPILEX EX Apply 1 patch topically daily as needed (wound care).   metolazone 2.5 MG tablet Commonly known as:  ZAROXOLYN Take once daily as needed (3 x max per week) for weight gain/swelling. Take extra 20 meq potassium pill with this.   oxyCODONE 5 MG immediate release tablet Commonly known as:  Oxy IR/ROXICODONE Take 1 tablet (5 mg total) by mouth every 6 (six) hours as needed for severe pain.   potassium chloride SA 20 MEQ tablet Commonly known as:  KLOR-CON M20 Take 40 mg (2 tabs) in am and 20 meq (1 tab) in pm   sildenafil 20 MG tablet Commonly known as:  REVATIO Take 2 tablets (40 mg total) by mouth 3 (three) times daily. What changed:  how much to take   spironolactone 25 MG tablet Commonly known as:  ALDACTONE TAKE 1 TABLET (25 MG TOTAL) BY MOUTH DAILY. What changed:  See the new instructions.       Disposition:  Home Discharge Instructions    Diet - low sodium heart healthy    Complete by:  As directed    Discharge instructions    Complete by:  As directed    See last page of AVS for wound care, activity, and bathing instruction. Please do not take aspirin for one week after discharge, then resume as  normal.   Increase activity slowly    Complete by:  As directed      Follow-up Information    [facility] Office Follow up on 08/02/2016.   Specialty:  Cardiology Why:  10:00AM, wound check Contact information: [address], [address] [place] [address] [phone]       Marinus Maw, MD Follow up on 10/30/2016.   Specialty:  Cardiology Why:  10:00AM Contact information: [address] [place] [address] [phone]           Duration of Discharge Encounter: Greater than 30 minutes including physician time.  Signed,  Roe Rutherford Duke, PA-C 07/21/2016, 10:34 AM [phone]  EP Attending  Patient seen and examined. Agree with above. She is stable for DC home. She will be given some narcotic pain meds for several days of expected pain after her procedure.  Leonia Reeves.D.

## 2016-07-20 NOTE — Progress Notes (Signed)
Patient arrived on the unit from the cath lab, placed on tele CCMD notified, assessment completed see flowsheet, patient oriented to room and staff, bed in lowest position, call bell within reach will continue to monitor.

## 2016-07-20 NOTE — Interval H&P Note (Signed)
History and Physical Interval Note:  07/20/2016 7:36 AM  Jennye MoccasinSherita Gerhart  has presented today for surgery, with the diagnosis of eri  The various methods of treatment have been discussed with the patient and family. After consideration of risks, benefits and other options for treatment, the patient has consented to  Procedure(s): ICD QUALCOMMenerator Changeout (N/A) as a surgical intervention .  The patient's history has been reviewed, patient examined, no change in status, stable for surgery.  I have reviewed the patient's chart and labs.  Questions were answered to the patient's satisfaction.     Lewayne BuntingGregg Taylor

## 2016-07-21 ENCOUNTER — Encounter (HOSPITAL_COMMUNITY): Payer: Self-pay | Admitting: Family

## 2016-07-21 ENCOUNTER — Ambulatory Visit (HOSPITAL_COMMUNITY): Payer: Medicare Other

## 2016-07-21 DIAGNOSIS — I472 Ventricular tachycardia: Secondary | ICD-10-CM | POA: Diagnosis not present

## 2016-07-21 DIAGNOSIS — E1122 Type 2 diabetes mellitus with diabetic chronic kidney disease: Secondary | ICD-10-CM | POA: Diagnosis not present

## 2016-07-21 DIAGNOSIS — T82110A Breakdown (mechanical) of cardiac electrode, initial encounter: Secondary | ICD-10-CM | POA: Diagnosis not present

## 2016-07-21 DIAGNOSIS — I5022 Chronic systolic (congestive) heart failure: Secondary | ICD-10-CM | POA: Diagnosis not present

## 2016-07-21 DIAGNOSIS — Z4502 Encounter for adjustment and management of automatic implantable cardiac defibrillator: Secondary | ICD-10-CM | POA: Diagnosis not present

## 2016-07-21 LAB — GLUCOSE, CAPILLARY: Glucose-Capillary: 195 mg/dL — ABNORMAL HIGH (ref 65–99)

## 2016-07-21 MED ORDER — OXYCODONE HCL 5 MG PO TABS: 5.0000 mg | ORAL_TABLET | Freq: Four times a day (QID) | ORAL | 0 refills | Status: DC | PRN

## 2016-07-21 MED ORDER — ASPIRIN EC 81 MG PO TBEC: 81.0000 mg | DELAYED_RELEASE_TABLET | Freq: Every evening | ORAL | Status: DC

## 2016-07-21 NOTE — Progress Notes (Signed)
Progress Note  Patient Name: Terika Pillard Date of Encounter: 07/21/2016  Primary Cardiologist: Bensimohn  Subjective   The patient remains sore after her ICD removal and insertion of a new ICD lead and generator with relocation to a subpectoral pocket.   Inpatient Medications    Scheduled Meds: . aspirin EC  81 mg Oral QPM  . digoxin  0.125 mg Oral QPM  . ethacrynic acid  150 mg Oral BID  . metolazone  2.5 mg Oral Daily  . potassium chloride SA  20 mEq Oral Daily  . sildenafil  20 mg Oral TID  . spironolactone  12.5 mg Oral Daily   Continuous Infusions:  PRN Meds: acetaminophen, acetaminophen, albuterol, insulin glargine, ondansetron (ZOFRAN) IV, oxyCODONE   Vital Signs    Vitals:   07/20/16 1817 07/20/16 2039 07/21/16 0423 07/21/16 0912  BP:  109/74 109/68 108/69  Pulse: 89 89 84 79  Resp:  17 18 18   Temp:  98.3 F (36.8 C) 98.4 F (36.9 C) 98.5 F (36.9 C)  TempSrc:  Oral Oral Oral  SpO2:  98% 100% 99%  Weight:      Height:        Intake/Output Summary (Last 24 hours) at 07/21/16 0916 Last data filed at 07/21/16 0515  Gross per 24 hour  Intake              580 ml  Output             1075 ml  Net             -495 ml   Filed Weights   07/20/16 0715  Weight: 121 lb (54.9 kg)    Telemetry    NSR - Personally Reviewed  ECG    none - Personally Reviewed  Physical Exam   GEN: No acute distress.   Neck: 6 cm JVD Cardiac: RRR, no murmurs, rubs, or gallops.  Respiratory: Clear to auscultation bilaterally. Incision looks well healed. GI: Soft, nontender, non-distended  MS: No edema; No deformity. Neuro:  Nonfocal  Psych: Normal affect   Labs    ChemistryNo results for input(s): NA, K, CL, CO2, GLUCOSE, BUN, CREATININE, CALCIUM, PROT, ALBUMIN, AST, ALT, ALKPHOS, BILITOT, GFRNONAA, GFRAA, ANIONGAP in the last 168 hours.   HematologyNo results for input(s): WBC, RBC, HGB, HCT, MCV, MCH, MCHC, RDW, PLT in the last 168 hours.  Cardiac EnzymesNo  results for input(s): TROPONINI in the last 168 hours. No results for input(s): TROPIPOC in the last 168 hours.   BNPNo results for input(s): BNP, PROBNP in the last 168 hours.   DDimer No results for input(s): DDIMER in the last 168 hours.   Radiology    Dg Chest 2 View  Result Date: 07/21/2016 CLINICAL DATA:  ICD EXAM: CHEST  2 VIEW COMPARISON:  01/13/2015 FINDINGS: Cardiac enlargement. Pulmonary vascular congestion with mild interstitial edema. Minimal pleural effusions. ICD has been changed since the prior study with a new generator pack and new lead. IMPRESSION: Interval changes of ICD without pneumothorax. Mild congestive heart failure. Electronically Signed   By: Marlan Palau M.D.   On: 07/21/2016 07:24    Cardiac Studies   ICD interogation demonstrated normal VDD ICD function  Patient Profile     56 y.o. female admitted after undergoing ICD gen change and finding that her ICD lead was broken at gen change out. She has had a new lead placed.   Assessment & Plan    1. ICD - her device is  working normally and CXR looks good. She is stable for DC home. Would continue her home meds except hold ASA for a week. Incision check in 7-14 days. Usual dc instructions. She will have some extra pain due top subpectoral implant. She can have a prescription for 20 oxycodone with no refills.  Signed, Lewayne BuntingGregg Federick Levene, MD  07/21/2016, 9:16 AM  Patient ID: Jennye MoccasinSherita Dykema, female   DOB: 10/07/1960, 56 y.o.   MRN: [id number]

## 2016-07-21 NOTE — Progress Notes (Addendum)
Patient in a stable condition, discharge education reviewed with patient, she verbalised understanding, tele dc ccmd notified, iv  removed, patient belongings at bedside, paper prescription given to patient, patient awaiting her daughter for transportation home. Patient transported home by her grandson

## 2016-07-21 NOTE — CV Procedure (Signed)
ICD Criteria  Current LVEF:20%. Within 12 months prior to implant: Yes   Heart failure history: Yes, Class II  Cardiomyopathy history: Yes, Non-Ischemic Cardiomyopathy.  Atrial Fibrillation/Atrial Flutter: No.  Ventricular tachycardia history: Yes, Hemodynamic instability present. VT Type: Sustained Ventricular Tachycardia - Monomorphic.  Cardiac arrest history: No.  History of syndromes with risk of sudden death: No.  Previous ICD: Yes, Reason for ICD:  Primary prevention.  Current ICD indication: Secondary  PPM indication: No.   Class I or II Bradycardia indication present: No  Beta Blocker therapy for 3 or more months: Yes, prescribed.   Ace Inhibitor/ARB therapy for 3 or more months: Yes, prescribed.

## 2016-07-21 NOTE — Discharge Instructions (Signed)
° ° °  Supplemental Discharge Instructions for  Pacemaker/Defibrillator Patients  Activity No heavy lifting or vigorous activity with your left/right arm for 6 to 8 weeks.  Do not raise your left/right arm above your head for one week.  Gradually raise your affected arm as drawn below.             07/24/16                     07/25/16                     07/26/16                  07/27/16 __  NO DRIVING for  1 week   ; you may begin driving on  1/61/095/18/18  .  WOUND CARE - Keep the wound area clean and dry.  Do not get this area wet for one week. No showers for one week; you may shower on 07/27/16   . - The tape/steri-strips on your wound will fall off; do not pull them off.  No bandage is needed on the site.  DO  NOT apply any creams, oils, or ointments to the wound area. - If you notice any drainage or discharge from the wound, any swelling or bruising at the site, or you develop a fever > 101? F after you are discharged home, call the office at once.  Special Instructions - You are still able to use cellular telephones; use the ear opposite the side where you have your pacemaker/defibrillator.  Avoid carrying your cellular phone near your device. - When traveling through airports, show security personnel your identification card to avoid being screened in the metal detectors.  Ask the security personnel to use the hand wand. - Avoid arc welding equipment, MRI testing (magnetic resonance imaging), TENS units (transcutaneous nerve stimulators).  Call the office for questions about other devices. - Avoid electrical appliances that are in poor condition or are not properly grounded. - Microwave ovens are safe to be near or to operate.  Additional information for defibrillator patients should your device go off: - If your device goes off ONCE and you feel fine afterward, notify the device clinic nurses. - If your device goes off ONCE and you do not feel well afterward, call 911. - If your device goes  off TWICE, call 911. - If your device goes off THREE times in one day, call 911.  DO NOT DRIVE YOURSELF OR A FAMILY MEMBER WITH A DEFIBRILLATOR TO THE HOSPITAL--CALL 911.

## 2016-07-25 ENCOUNTER — Encounter (HOSPITAL_COMMUNITY): Payer: Self-pay | Admitting: Cardiology

## 2016-08-02 ENCOUNTER — Ambulatory Visit (INDEPENDENT_AMBULATORY_CARE_PROVIDER_SITE_OTHER): Payer: Medicare Other | Admitting: *Deleted

## 2016-08-02 DIAGNOSIS — I5022 Chronic systolic (congestive) heart failure: Secondary | ICD-10-CM | POA: Diagnosis not present

## 2016-08-02 DIAGNOSIS — I472 Ventricular tachycardia, unspecified: Secondary | ICD-10-CM

## 2016-08-02 DIAGNOSIS — Z95810 Presence of automatic (implantable) cardiac defibrillator: Secondary | ICD-10-CM | POA: Diagnosis not present

## 2016-08-02 LAB — CUP PACEART INCLINIC DEVICE CHECK
Brady Statistic RV Percent Paced: 0 %
Date Time Interrogation Session: 20180524110121
HIGH POWER IMPEDANCE MEASURED VALUE: 65 Ohm
Implantable Lead Location: 753860
Implantable Lead Serial Number: 49855684
Implantable Pulse Generator Implant Date: 20180511
Lead Channel Impedance Value: 624 Ohm
Lead Channel Pacing Threshold Amplitude: 0.6 V
Lead Channel Sensing Intrinsic Amplitude: 1.3 mV
Lead Channel Sensing Intrinsic Amplitude: 21.7 mV
MDC IDC LEAD IMPLANT DT: 20180511
MDC IDC MSMT BATTERY VOLTAGE: 3.22 V
MDC IDC MSMT LEADCHNL RV PACING THRESHOLD PULSEWIDTH: 0.4 ms
MDC IDC PG SERIAL: 60993176
MDC IDC SET LEADCHNL RV PACING AMPLITUDE: 3 V
MDC IDC SET LEADCHNL RV PACING PULSEWIDTH: 0.4 ms
MDC IDC SET LEADCHNL RV SENSING SENSITIVITY: 0.8 mV
Pulse Gen Model: 404633

## 2016-08-02 NOTE — Progress Notes (Signed)
Wound check appointment. Steri-strips removed. Wound without redness or edema. Incision edges all approximated, superficial skin appears pink but no intervention necessary. Normal device function. Thresholds, sensing, and impedances consistent with implant measurements. Device programmed at 3V for extra safety margin until 3 month visit. Histogram distribution appropriate for patient and level of activity. 5 episodes of brief conducted AT labeled as NSVT. Patient educated about wound care, arm mobility, lifting restrictions, shock plan. ROV with GT 10/30/16.

## 2016-08-10 ENCOUNTER — Other Ambulatory Visit (HOSPITAL_COMMUNITY): Payer: Self-pay | Admitting: Cardiology

## 2016-08-10 MED ORDER — SPIRONOLACTONE 25 MG PO TABS: ORAL_TABLET | ORAL | 3 refills | Status: DC

## 2016-08-10 MED ORDER — METOLAZONE 2.5 MG PO TABS: ORAL_TABLET | ORAL | 6 refills | Status: DC

## 2016-08-10 MED ORDER — MAGNESIUM OXIDE 400 (241.3 MG) MG PO TABS: ORAL_TABLET | ORAL | 3 refills | Status: DC

## 2016-08-13 ENCOUNTER — Other Ambulatory Visit (HOSPITAL_COMMUNITY): Payer: Self-pay | Admitting: Cardiology

## 2016-08-13 MED ORDER — SPIRONOLACTONE 25 MG PO TABS: ORAL_TABLET | ORAL | 3 refills | Status: DC

## 2016-08-13 MED ORDER — METOLAZONE 2.5 MG PO TABS: ORAL_TABLET | ORAL | 3 refills | Status: DC

## 2016-08-13 MED ORDER — DIGOXIN 125 MCG PO TABS: 0.1250 mg | ORAL_TABLET | Freq: Every evening | ORAL | 3 refills | Status: DC

## 2016-08-20 ENCOUNTER — Encounter: Payer: Medicare Other | Admitting: Podiatry

## 2016-08-21 ENCOUNTER — Telehealth: Payer: Self-pay | Admitting: *Deleted

## 2016-08-21 NOTE — Telephone Encounter (Signed)
Correction- AF since 10:15 PM 08/20/16.

## 2016-08-21 NOTE — Telephone Encounter (Signed)
Biotronik ICD alert- new AF since 10:15am 08/20/16.  [name] was called and made aware- she reports that she felt bad yesterday but feels better this morning. CHADSVASC of 4. Appt made with AF Clinic 08/27/16 at 10am. Patient agreeable. Gave directions, phone number and parking code for AF Clinic.

## 2016-08-22 ENCOUNTER — Telehealth (HOSPITAL_COMMUNITY): Payer: Self-pay | Admitting: *Deleted

## 2016-08-22 ENCOUNTER — Encounter (HOSPITAL_COMMUNITY): Payer: Self-pay | Admitting: *Deleted

## 2016-08-22 ENCOUNTER — Inpatient Hospital Stay (HOSPITAL_COMMUNITY)
Admission: EM | Admit: 2016-08-22 | Discharge: 2016-10-01 | DRG: 291 | Disposition: A | Discharge status: Home Health | Payer: Medicare Other | Attending: Internal Medicine | Admitting: Internal Medicine

## 2016-08-22 ENCOUNTER — Emergency Department (HOSPITAL_COMMUNITY): Payer: Medicare Other

## 2016-08-22 DIAGNOSIS — R509 Fever, unspecified: Secondary | ICD-10-CM

## 2016-08-22 DIAGNOSIS — Z515 Encounter for palliative care: Secondary | ICD-10-CM

## 2016-08-22 DIAGNOSIS — I130 Hypertensive heart and chronic kidney disease with heart failure and stage 1 through stage 4 chronic kidney disease, or unspecified chronic kidney disease: Secondary | ICD-10-CM | POA: Diagnosis present

## 2016-08-22 DIAGNOSIS — I5082 Biventricular heart failure: Secondary | ICD-10-CM | POA: Diagnosis present

## 2016-08-22 DIAGNOSIS — I5084 End stage heart failure: Secondary | ICD-10-CM | POA: Diagnosis present

## 2016-08-22 DIAGNOSIS — Z888 Allergy status to other drugs, medicaments and biological substances status: Secondary | ICD-10-CM

## 2016-08-22 DIAGNOSIS — R059 Cough, unspecified: Secondary | ICD-10-CM

## 2016-08-22 DIAGNOSIS — I4891 Unspecified atrial fibrillation: Secondary | ICD-10-CM | POA: Diagnosis present

## 2016-08-22 DIAGNOSIS — N390 Urinary tract infection, site not specified: Secondary | ICD-10-CM | POA: Diagnosis not present

## 2016-08-22 DIAGNOSIS — N179 Acute kidney failure, unspecified: Secondary | ICD-10-CM | POA: Diagnosis not present

## 2016-08-22 DIAGNOSIS — F17290 Nicotine dependence, other tobacco product, uncomplicated: Secondary | ICD-10-CM | POA: Diagnosis present

## 2016-08-22 DIAGNOSIS — I483 Typical atrial flutter: Secondary | ICD-10-CM

## 2016-08-22 DIAGNOSIS — E871 Hypo-osmolality and hyponatremia: Secondary | ICD-10-CM | POA: Diagnosis not present

## 2016-08-22 DIAGNOSIS — R570 Cardiogenic shock: Secondary | ICD-10-CM | POA: Diagnosis not present

## 2016-08-22 DIAGNOSIS — Z6820 Body mass index (BMI) 20.0-20.9, adult: Secondary | ICD-10-CM

## 2016-08-22 DIAGNOSIS — F418 Other specified anxiety disorders: Secondary | ICD-10-CM | POA: Diagnosis present

## 2016-08-22 DIAGNOSIS — E785 Hyperlipidemia, unspecified: Secondary | ICD-10-CM | POA: Diagnosis present

## 2016-08-22 DIAGNOSIS — Z794 Long term (current) use of insulin: Secondary | ICD-10-CM

## 2016-08-22 DIAGNOSIS — K5900 Constipation, unspecified: Secondary | ICD-10-CM | POA: Diagnosis not present

## 2016-08-22 DIAGNOSIS — Z8249 Family history of ischemic heart disease and other diseases of the circulatory system: Secondary | ICD-10-CM

## 2016-08-22 DIAGNOSIS — G4730 Sleep apnea, unspecified: Secondary | ICD-10-CM | POA: Diagnosis present

## 2016-08-22 DIAGNOSIS — I4892 Unspecified atrial flutter: Secondary | ICD-10-CM

## 2016-08-22 DIAGNOSIS — E1151 Type 2 diabetes mellitus with diabetic peripheral angiopathy without gangrene: Secondary | ICD-10-CM | POA: Diagnosis present

## 2016-08-22 DIAGNOSIS — R34 Anuria and oliguria: Secondary | ICD-10-CM | POA: Diagnosis not present

## 2016-08-22 DIAGNOSIS — Z9114 Patient's other noncompliance with medication regimen: Secondary | ICD-10-CM

## 2016-08-22 DIAGNOSIS — R001 Bradycardia, unspecified: Secondary | ICD-10-CM | POA: Diagnosis not present

## 2016-08-22 DIAGNOSIS — Z86718 Personal history of other venous thrombosis and embolism: Secondary | ICD-10-CM

## 2016-08-22 DIAGNOSIS — Z7189 Other specified counseling: Secondary | ICD-10-CM | POA: Diagnosis not present

## 2016-08-22 DIAGNOSIS — R05 Cough: Secondary | ICD-10-CM

## 2016-08-22 DIAGNOSIS — R251 Tremor, unspecified: Secondary | ICD-10-CM | POA: Diagnosis not present

## 2016-08-22 DIAGNOSIS — I272 Pulmonary hypertension, unspecified: Secondary | ICD-10-CM | POA: Diagnosis present

## 2016-08-22 DIAGNOSIS — N184 Chronic kidney disease, stage 4 (severe): Secondary | ICD-10-CM | POA: Diagnosis present

## 2016-08-22 DIAGNOSIS — Z79899 Other long term (current) drug therapy: Secondary | ICD-10-CM

## 2016-08-22 DIAGNOSIS — N183 Chronic kidney disease, stage 3 (moderate): Secondary | ICD-10-CM | POA: Diagnosis not present

## 2016-08-22 DIAGNOSIS — R278 Other lack of coordination: Secondary | ICD-10-CM | POA: Diagnosis not present

## 2016-08-22 DIAGNOSIS — R112 Nausea with vomiting, unspecified: Secondary | ICD-10-CM | POA: Diagnosis not present

## 2016-08-22 DIAGNOSIS — K219 Gastro-esophageal reflux disease without esophagitis: Secondary | ICD-10-CM | POA: Diagnosis present

## 2016-08-22 DIAGNOSIS — Z91040 Latex allergy status: Secondary | ICD-10-CM

## 2016-08-22 DIAGNOSIS — J449 Chronic obstructive pulmonary disease, unspecified: Secondary | ICD-10-CM | POA: Diagnosis present

## 2016-08-22 DIAGNOSIS — N170 Acute kidney failure with tubular necrosis: Secondary | ICD-10-CM | POA: Diagnosis not present

## 2016-08-22 DIAGNOSIS — I2721 Secondary pulmonary arterial hypertension: Secondary | ICD-10-CM | POA: Diagnosis present

## 2016-08-22 DIAGNOSIS — Z7982 Long term (current) use of aspirin: Secondary | ICD-10-CM

## 2016-08-22 DIAGNOSIS — Z955 Presence of coronary angioplasty implant and graft: Secondary | ICD-10-CM

## 2016-08-22 DIAGNOSIS — I252 Old myocardial infarction: Secondary | ICD-10-CM

## 2016-08-22 DIAGNOSIS — R64 Cachexia: Secondary | ICD-10-CM | POA: Diagnosis present

## 2016-08-22 DIAGNOSIS — I5023 Acute on chronic systolic (congestive) heart failure: Secondary | ICD-10-CM

## 2016-08-22 DIAGNOSIS — F419 Anxiety disorder, unspecified: Secondary | ICD-10-CM | POA: Diagnosis present

## 2016-08-22 DIAGNOSIS — I255 Ischemic cardiomyopathy: Secondary | ICD-10-CM | POA: Diagnosis present

## 2016-08-22 DIAGNOSIS — Z7902 Long term (current) use of antithrombotics/antiplatelets: Secondary | ICD-10-CM

## 2016-08-22 DIAGNOSIS — E1122 Type 2 diabetes mellitus with diabetic chronic kidney disease: Secondary | ICD-10-CM | POA: Diagnosis present

## 2016-08-22 DIAGNOSIS — D638 Anemia in other chronic diseases classified elsewhere: Secondary | ICD-10-CM | POA: Diagnosis present

## 2016-08-22 DIAGNOSIS — I340 Nonrheumatic mitral (valve) insufficiency: Secondary | ICD-10-CM | POA: Diagnosis not present

## 2016-08-22 DIAGNOSIS — I2510 Atherosclerotic heart disease of native coronary artery without angina pectoris: Secondary | ICD-10-CM | POA: Diagnosis present

## 2016-08-22 DIAGNOSIS — E875 Hyperkalemia: Secondary | ICD-10-CM | POA: Diagnosis not present

## 2016-08-22 DIAGNOSIS — Z95810 Presence of automatic (implantable) cardiac defibrillator: Secondary | ICD-10-CM

## 2016-08-22 LAB — CBC
HEMATOCRIT: 41.2 % (ref 36.0–46.0)
Hemoglobin: 13.9 g/dL (ref 12.0–15.0)
MCH: 28.6 pg (ref 26.0–34.0)
MCHC: 33.7 g/dL (ref 30.0–36.0)
MCV: 84.8 fL (ref 78.0–100.0)
Platelets: 139 10*3/uL — ABNORMAL LOW (ref 150–400)
RBC: 4.86 MIL/uL (ref 3.87–5.11)
RDW: 16.9 % — ABNORMAL HIGH (ref 11.5–15.5)
WBC: 4 10*3/uL (ref 4.0–10.5)

## 2016-08-22 LAB — BASIC METABOLIC PANEL
ANION GAP: 9 (ref 5–15)
BUN: 27 mg/dL — ABNORMAL HIGH (ref 6–20)
CHLORIDE: 96 mmol/L — AB (ref 101–111)
CO2: 30 mmol/L (ref 22–32)
Calcium: 9.4 mg/dL (ref 8.9–10.3)
Creatinine, Ser: 1.81 mg/dL — ABNORMAL HIGH (ref 0.44–1.00)
GFR calc non Af Amer: 30 mL/min — ABNORMAL LOW (ref >60)
GFR, EST AFRICAN AMERICAN: 35 mL/min — AB (ref >60)
GLUCOSE: 167 mg/dL — AB (ref 65–99)
Potassium: 3.9 mmol/L (ref 3.5–5.1)
Sodium: 135 mmol/L (ref 135–145)

## 2016-08-22 LAB — I-STAT TROPONIN, ED: Troponin i, poc: 0.12 ng/mL (ref 0.00–0.08)

## 2016-08-22 MED ORDER — ACETAMINOPHEN 325 MG PO TABS
650.0000 mg | ORAL_TABLET | ORAL | Status: DC | PRN
  Administered 2016-08-23 – 2016-09-05 (×3): 650 mg via ORAL
  Filled 2016-08-22 (×3): qty 2

## 2016-08-22 MED ORDER — POTASSIUM CHLORIDE CRYS ER 20 MEQ PO TBCR
40.0000 meq | EXTENDED_RELEASE_TABLET | Freq: Two times a day (BID) | ORAL | Status: DC
  Administered 2016-08-22 – 2016-08-28 (×12): 40 meq via ORAL
  Filled 2016-08-22 (×2): qty 2
  Filled 2016-08-22: qty 4
  Filled 2016-08-22: qty 2
  Filled 2016-08-22 (×2): qty 4
  Filled 2016-08-22: qty 2
  Filled 2016-08-22: qty 4
  Filled 2016-08-22 (×4): qty 2

## 2016-08-22 MED ORDER — MAGNESIUM OXIDE 400 (241.3 MG) MG PO TABS
400.0000 mg | ORAL_TABLET | Freq: Every day | ORAL | Status: DC
  Administered 2016-08-22 – 2016-09-03 (×13): 400 mg via ORAL
  Filled 2016-08-22 (×13): qty 1

## 2016-08-22 MED ORDER — SILDENAFIL CITRATE 20 MG PO TABS
20.0000 mg | ORAL_TABLET | Freq: Three times a day (TID) | ORAL | Status: DC
  Administered 2016-08-22 – 2016-09-17 (×75): 20 mg via ORAL
  Filled 2016-08-22 (×77): qty 1

## 2016-08-22 MED ORDER — ALBUTEROL SULFATE (2.5 MG/3ML) 0.083% IN NEBU: 2.5000 mg | INHALATION_SOLUTION | Freq: Four times a day (QID) | RESPIRATORY_TRACT | Status: DC | PRN

## 2016-08-22 MED ORDER — SODIUM CHLORIDE 0.9% FLUSH: 3.0000 mL | INTRAVENOUS | Status: DC | PRN

## 2016-08-22 MED ORDER — AMIODARONE HCL IN DEXTROSE 360-4.14 MG/200ML-% IV SOLN
60.0000 mg/h | INTRAVENOUS | Status: AC
  Administered 2016-08-22 (×2): 60 mg/h via INTRAVENOUS
  Filled 2016-08-22 (×2): qty 200

## 2016-08-22 MED ORDER — APIXABAN 5 MG PO TABS
5.0000 mg | ORAL_TABLET | Freq: Two times a day (BID) | ORAL | Status: DC
  Administered 2016-08-22 – 2016-08-23 (×2): 5 mg via ORAL
  Filled 2016-08-22 (×3): qty 1

## 2016-08-22 MED ORDER — FUROSEMIDE 10 MG/ML IJ SOLN
40.0000 mg | Freq: Two times a day (BID) | INTRAMUSCULAR | Status: DC
  Filled 2016-08-22: qty 4

## 2016-08-22 MED ORDER — ASPIRIN EC 81 MG PO TBEC
81.0000 mg | DELAYED_RELEASE_TABLET | Freq: Every day | ORAL | Status: DC
  Administered 2016-08-22 – 2016-09-24 (×33): 81 mg via ORAL
  Filled 2016-08-22 (×33): qty 1

## 2016-08-22 MED ORDER — SODIUM CHLORIDE 0.9% FLUSH
3.0000 mL | Freq: Two times a day (BID) | INTRAVENOUS | Status: DC
  Administered 2016-08-24: 3 mL via INTRAVENOUS

## 2016-08-22 MED ORDER — SODIUM CHLORIDE 0.9 % IV SOLN: 250.0000 mL | INTRAVENOUS | Status: DC | PRN

## 2016-08-22 MED ORDER — AMIODARONE LOAD VIA INFUSION
150.0000 mg | Freq: Once | INTRAVENOUS | Status: AC
  Administered 2016-08-22: 150 mg via INTRAVENOUS
  Filled 2016-08-22: qty 83.34

## 2016-08-22 MED ORDER — AMIODARONE HCL IN DEXTROSE 360-4.14 MG/200ML-% IV SOLN
30.0000 mg/h | INTRAVENOUS | Status: DC
  Administered 2016-08-23 – 2016-09-06 (×26): 30 mg/h via INTRAVENOUS
  Filled 2016-08-22 (×29): qty 200

## 2016-08-22 MED ORDER — ONDANSETRON HCL 4 MG/2ML IJ SOLN
4.0000 mg | Freq: Four times a day (QID) | INTRAMUSCULAR | Status: DC | PRN
  Administered 2016-09-16 – 2016-09-28 (×8): 4 mg via INTRAVENOUS
  Filled 2016-08-22 (×8): qty 2

## 2016-08-22 NOTE — ED Notes (Signed)
Patient still has active chest pain. Dr. Gala RomneyBensimhon notified (was at the bedside at the time) and stated to change her bed request to stepdown instead of telemetry. Bed placement made aware and to change bed assignment.

## 2016-08-22 NOTE — H&P (Signed)
Advanced Heart Failure Team History and Physical Note   Primary Physician: None  Primary Cardiologist: Dr Gala Romney EP: Dr Ladona Ridgel    Reason for Admission: A fib RVR/A/C Systolic Heart Failure    HPI:   Hildy is a 56 year old with a history of ICM, chronic systolic heart failure, CAD S/P multiple interventions ICD biotronic, DM, hyperlipidemia, PAD, H/O DVT, and H/O LV thrombus admitted today with new onset A Fib RVR. Of note she was on coumadin in the past for LV thrombus but stopped due to ongoing follow up.   Earlier today ICD interrogation showed new onset A fib that started on June 11th. She was called by the HF clinic and reported increased dyspnea at rest and with exertion. Weight at home has been going up since Monday but she cant remember how much she weighs. Says she is not taking all medications but did take metolazone earlier this week. This afternoon she was more short of breath so she presented to the ED for further evaluation. EKG with A flutter 131 bpm.  CXR with vascular congestion.   ECHO 10/2015 EF 20-25% RV mildly dilated Peak PA pressure 83 mm HG  Review of Systems: [y] = yes, [ ]  = no   General: Weight gain [ Y]; Weight loss [ ] ; Anorexia [ ] ; Fatigue [Y ]; Fever [ ] ; Chills [ ] ; Weakness [Y ]  Cardiac: Chest pain/pressure [ ] ; Resting SOB [Y ]; Exertional SOB [ Y]; Orthopnea [ Y]; Pedal Edema [Y ]; Palpitations [ ] ; Syncope [ ] ; Presyncope [ ] ; Paroxysmal nocturnal dyspnea[Y ]  Pulmonary: Cough [Y ]; Wheezing[ ] ; Hemoptysis[ ] ; Sputum [ ] ; Snoring [ ]   GI: Vomiting[ ] ; Dysphagia[ ] ; Melena[ ] ; Hematochezia [ ] ; Heartburn[ ] ; Abdominal pain [ ] ; Constipation [ ] ; Diarrhea [ ] ; BRBPR [ ]   GU: Hematuria[ ] ; Dysuria [ ] ; Nocturia[ ]   Vascular: Pain in legs with walking [ Y]; Pain in feet with lying flat [ ] ; Non-healing sores [ ] ; Stroke [ ] ; TIA [ ] ; Slurred speech [ ] ;  Neuro: Headaches[ ] ; Vertigo[ ] ; Seizures[ ] ; Paresthesias[ ] ;Blurred vision [ ] ; Diplopia [ ] ;  Vision changes [ ]   Ortho/Skin: Arthritis [ ] ; Joint pain [ Y]; Muscle pain [ ] ; Joint swelling [ ] ; Back Pain [ ] ; Rash [ ]   Psych: Depression[ ] ; Anxiety[ ]   Heme: Bleeding problems [ ] ; Clotting disorders [ ] ; Anemia [ ]   Endocrine: Diabetes [Y]; Thyroid dysfunction[ ]   Home Medications Prior to Admission medications   Medication Sig Start Date End Date Taking? Authorizing Provider  acetaminophen (TYLENOL) 500 MG tablet Take 1,000 mg by mouth every 6 (six) hours as needed (pain).     [provider]  albuterol (PROVENTIL) (2.5 MG/3ML) 0.083% nebulizer solution Take 2.5 mg by nebulization every 6 (six) hours as needed for wheezing or shortness of breath.    [provider]  aspirin EC 81 MG tablet Take 1 tablet (81 mg total) by mouth every evening. Resume aspirin 1 week following discharge. 07/21/16   [facility], Roe Rutherford, PA  digoxin (LANOXIN) 0.125 MG tablet Take 1 tablet (0.125 mg total) by mouth every evening. 08/13/16   Jessilynn Taft, Bevelyn Buckles, MD  ethacrynic acid (EDECRIN) 25 MG tablet Take 6 tablets (150 mg total) by mouth 2 (two) times daily. 06/06/16   Elliot Meldrum, Bevelyn Buckles, MD  insulin glargine (LANTUS) 100 UNIT/ML injection Inject 5 Units into the skin daily as needed (if blood sugar >200).  [provider]  magnesium oxide (MAG-OX) 400 (241.3 Mg) MG tablet TAKE 1/2 TABLET (200MG ) EVERY DAY IN THE EVENING 08/10/16   Rayni Nemitz, Bevelyn Buckles, MD  metolazone (ZAROXOLYN) 2.5 MG tablet Take once daily as needed (3 x max per week) for weight gain/swelling. Take extra 20 meq potassium pill with this. 08/13/16   Lanell Dubie, Bevelyn Buckles, MD  oxyCODONE (OXY IR/ROXICODONE) 5 MG immediate release tablet Take 1 tablet (5 mg total) by mouth every 6 (six) hours as needed for severe pain. 07/21/16   [facility], Roe Rutherford, PA  potassium chloride SA (KLOR-CON M20) 20 MEQ tablet Take 40 mg (2 tabs) in am and 20 meq (1 tab) in pm 11/28/15   Trever Streater, Bevelyn Buckles, MD  sildenafil (REVATIO) 20 MG tablet  Take 2 tablets (40 mg total) by mouth 3 (three) times daily. Patient taking differently: Take 20 mg by mouth 3 (three) times daily.  11/03/15   Palin Tristan, Bevelyn Buckles, MD  spironolactone (ALDACTONE) 25 MG tablet TAKE 1 TABLET (25 MG TOTAL) BY MOUTH DAILY EVERY EVENING 08/13/16   Lizzy Hamre, Bevelyn Buckles, MD  Wound Dressings (MEPILEX EX) Apply 1 patch topically daily as needed (wound care).    [provider]    Past Medical History: Past Medical History:  Diagnosis Date  . AICD (automatic cardioverter/defibrillator) present    a. Biotronik placed 2009. b. ICD discharge 2011, 03/2013.  [name] Alcohol abuse   . Anxiety   . Arterial embolism (HCC)    a. H/o RLE extremity ischemia in 2012 s/p right iliofemoral embolectomy with compartment fasciotomy.  . Asthma   . CAD (coronary artery disease)    a. 1996: anterior wall MI tx with PTCA. b. 2003: PCI/DES to circumflex marginal. c. 2004: PTCA/CBA/stenting of ostium of D1 and LAD. d. 06/2003: s/p PTCA/DES of mRCA. e. 11/2003: CBA of bifurcation diagonal LAD. f. 2007: PTCA/DES to RCA, CBA/stenting to OM2.  . Chronic systolic heart failure (HCC)    a.  secondary to severe ischemic cardiomyopathy with EF 20-25%;  b. s/p rimplantation of Biotronik single chamber ICD by Dr Ladona Ridgel 02/2008;  c.  RHC 07/2013 showed well compensated left sided pressures with moderate PAH and low cardiac output. d. 2D Echo 03/2014: EF 20-25%, grade 2 DD, PAP .  . CKD (chronic kidney disease), stage II   . COPD (chronic obstructive pulmonary disease) (HCC)   . Diabetes mellitus, type 2 (HCC)   . Dizziness   . DVT (deep venous thrombosis) (HCC)    a. 06/2010.  [name] GERD (gastroesophageal reflux disease)   . History of noncompliance with medical treatment   . HLD (hyperlipidemia)   . LV (left ventricular) mural thrombus    a. Dx on echo 03/2014.  [name] Myocardial infarction (HCC)   . Pulmonary hypertension (HCC)   . Sleep apnea   . Tobacco abuse   . V-tach [facility])     Past Surgical  History: Past Surgical History:  Procedure Laterality Date  . CARDIAC CATHETERIZATION    . DIAGNOSTIC LAPAROSCOPY    . ICD GENERATOR CHANGEOUT N/A 07/20/2016   Procedure: ICD Generator Changeout;  Surgeon: Marinus Maw, MD;  Location: [facility] INVASIVE CV LAB;  Service: Cardiovascular;  Laterality: N/A;  . INSERT / REPLACE / REMOVE PACEMAKER    . RIGHT HEART CATHETERIZATION N/A 07/23/2013   Procedure: RIGHT HEART CATH;  Surgeon: Dolores Patty, MD;  Location: [facility] CATH LAB;  Service: Cardiovascular;  Laterality: N/A;  . TUBAL LIGATION      Family  History: Family History  Problem Relation Age of Onset  . Coronary artery disease Unknown        FMILY HISTORY    Social History: Social History   Social History  . Marital status: Divorced    Spouse name: N/A  . Number of children: N/A  . Years of education: N/A   Occupational History  . disabled Disability   Social History Main Topics  . Smoking status: Current Some Day Smoker    Packs/day: 0.25    Years: 21.00    Types: Cigars  . Smokeless tobacco: Never Used     Comment: Smokes 2-3 cigars daily, quit smoking cigarettes 3 yrs ago.   . Alcohol use No  . Drug use: No  . Sexual activity: No   Other Topics Concern  . None   Social History Narrative  . None    Allergies:  Allergies  Allergen Reactions  . Nyquil Multi-Symptom [Pseudoeph-Doxylamine-Dm-Apap] Other (See Comments)    Fatigue, chest pressure and pain  . Sudafed [Pseudoephedrine] Other (See Comments)    Fatigue, chest pressure and pain  . Lasix [Furosemide]     Rash  . Torsemide     Rash  . Zocor [Simvastatin]     Myalgias   . Latex Rash  . Ramipril Cough    Objective:    Vital Signs:   Temp:  [98.2 F (36.8 C)] 98.2 F (36.8 C) (06/13 1535) Pulse Rate:  [129] 129 (06/13 1535) Resp:  [17] 17 (06/13 1535) BP: (100)/(76) 100/76 (06/13 1535) SpO2:  [100 %] 100 % (06/13 1535) Weight:  [118 lb 6.4 oz (53.7 kg)] 118 lb 6.4 oz (53.7 kg) (06/13  1538)   Filed Weights   08/22/16 1538  Weight: 118 lb 6.4 oz (53.7 kg)    Physical Exam: General:  Chronically ill. Appears fatigued.  HEENT: normal Neck: supple. JVP ~10. Carotids 2+ bilat; no bruits. No lymphadenopathy or thryomegaly appreciated. Cor: PMI nondisplaced. Tachy Regular rate & rhythm. No rubs, gallops or murmurs. Lungs: Crackles LLL RLLL Abdomen: soft, nontender, nondistended. No hepatosplenomegaly. No bruits or masses. Good bowel sounds. Extremities: no cyanosis, clubbing, R and LLE 1-2+ edema. Extensive healed scars on back  Neuro: alert & orientedx3, cranial nerves grossly intact. moves all 4 extremities w/o difficulty. Affect pleasant  Telemetry: A flutter 131 on EKG Personally reviewed   Labs: Basic Metabolic Panel: No results for input(s): NA, K, CL, CO2, GLUCOSE, BUN, CREATININE, CALCIUM, MG, PHOS in the last 168 hours.  Liver Function Tests: No results for input(s): AST, ALT, ALKPHOS, BILITOT, PROT, ALBUMIN in the last 168 hours. No results for input(s): LIPASE, AMYLASE in the last 168 hours. No results for input(s): AMMONIA in the last 168 hours.  CBC:  Recent Labs Lab 08/22/16 1547  WBC 4.0  HGB 13.9  HCT 41.2  MCV 84.8  PLT 139*    Cardiac Enzymes: No results for input(s): CKTOTAL, CKMB, CKMBINDEX, TROPONINI in the last 168 hours.  BNP: BNP (last 3 results)  Recent Labs  09/28/15 1114  BNP 871.3*    ProBNP (last 3 results) No results for input(s): PROBNP in the last 8760 hours.   CBG: No results for input(s): GLUCAP in the last 168 hours.  Coagulation Studies: No results for input(s): LABPROT, INR in the last 72 hours.  Other results: EKG:  A Flutter 131 bpm. Personally reviewed   Imaging:  No results found.      Assessment/Plan/Discussion   [name] is a 56 year old  with h/o chronic systolic heart failure, ICM, Biotronic ICD, and PAH admitted today with new onset A Fib/Flutter  1. A fib/flutter RVR New Onset- June  11 with device interrogation.  A Fib identified on device interrogation and on EKG in the ED. Need to try and restore NSR. Load amio and start eliqiuis 5 mg twice a day.  If she doesn't convert will need to have TEE/DC-CV. .  2. A/C Systolic Heart Failure- ICM. ECHO 10/2015 EF 20-25%.NYHA III-IV. Biotronic ICD  Diurese with IV lasix. No bb as she has been intolerant in the past with fatigue and hypotension. . No ace with cough. Repeat ECHO  3. PAH- continue sildenafil 20 mg three times a day.  4. CAD S/P multiple interventions. Continues asa.  No nitro with sildenafil.  5. PAD - occluded left SFA with mild/mod ABi. No revascularization was needed October 2017. 6. H/O LV Thrombus- repeat ECHO   Admit to tele or stepdown.    Length of Stay: 0   Amy Clegg NP-C  08/22/2016, 4:16 PM  Advanced Heart Failure Team Pager [phone] (M-F; 7a - 4p)  Please contact [facility] for night-coverage after hours (4p -7a ) and weekends on [email or web address]  Patient seen and examined with [name], NP. We discussed all aspects of the encounter. I agree with the assessment and plan as stated above.   She has decompensated HF in setting of new-onset AFL with RVR. Will start IV amio and Eliquis. Will need TEE/DC-CV after 3 doses of Eliquis. Start IV diuresis. Repeat echo.   ICD interrogated personally and AFL started several days ago. No VT.   Arvilla Meres, MD  6:23 PM

## 2016-08-22 NOTE — Progress Notes (Signed)
Patient dropped off FMLA paperwork for family remember on Aug 02, 2016.  Paperwork completed today and VM left for patient to pick up at front desk as requested.  Original forms will be scanned to patient's electronic medical record

## 2016-08-22 NOTE — ED Notes (Signed)
Called Xray to send pt. To [id number]

## 2016-08-22 NOTE — ED Triage Notes (Signed)
Pt reports being heart failure pt. Has been told that her heart monitor has been picking up Afib. Pt reports having fatigue, sob and leg swelling. No hx of afib.

## 2016-08-22 NOTE — Progress Notes (Signed)
  Amiodarone Drug - Drug Interaction Consult Note  Recommendations: If digoxin is resumed consider reducing dose by 50%.  Amiodarone is metabolized by the cytochrome P450 system and therefore has the potential to cause many drug interactions. Amiodarone has an average plasma half-life of 50 days (range 20 to 100 days).   There is potential for drug interactions to occur several weeks or months after stopping treatment and the onset of drug interactions may be slow after initiating amiodarone.   []  Statins: Increased risk of myopathy. Simvastatin- restrict dose to 20mg  daily. Other statins: counsel patients to report any muscle pain or weakness immediately.  []  Anticoagulants: Amiodarone can increase anticoagulant effect. Consider warfarin dose reduction. Patients should be monitored closely and the dose of anticoagulant altered accordingly, remembering that amiodarone levels take several weeks to stabilize.  []  Antiepileptics: Amiodarone can increase plasma concentration of phenytoin, the dose should be reduced. Note that small changes in phenytoin dose can result in large changes in levels. Monitor patient and counsel on signs of toxicity.  []  Beta blockers: increased risk of bradycardia, AV block and myocardial depression. Sotalol - avoid concomitant use.  []   Calcium channel blockers (diltiazem and verapamil): increased risk of bradycardia, AV block and myocardial depression.  []   Cyclosporine: Amiodarone increases levels of cyclosporine. Reduced dose of cyclosporine is recommended.  [x]  Digoxin dose should be halved when amiodarone is started.- Patient on Digoxin PTA, not currently resumed  []  Diuretics: increased risk of cardiotoxicity if hypokalemia occurs.  []  Oral hypoglycemic agents (glyburide, glipizide, glimepiride): increased risk of hypoglycemia. Patient's glucose levels should be monitored closely when initiating amiodarone therapy.   []  Drugs that prolong the QT interval:   Torsades de pointes risk may be increased with concurrent use - avoid if possible.  Monitor QTc, also keep magnesium/potassium WNL if concurrent therapy can't be avoided. [name]. Antibiotics: e.g. fluoroquinolones, erythromycin. . Antiarrhythmics: e.g. quinidine, procainamide, disopyramide, sotalol. . Antipsychotics: e.g. phenothiazines, haloperidol.  . Lithium, tricyclic antidepressants, and methadone.  Thank Brien MatesYou,  Alexsa Flaum L Azlee Monforte  08/22/2016 5:07 PM

## 2016-08-22 NOTE — ED Notes (Signed)
Patient transported to X-ray 

## 2016-08-22 NOTE — Telephone Encounter (Signed)
Received an alert from pt's Biotronik ICD pt has been having a-fib since 6/11, per her chart device clinic spoke w/pt yesterday regarding this and sch her to see a-fib clinic on Mon 6/18.  I called pt earlier this AM to check on her.  She states she felt bad on Mon but felt better yesterday, she states she had just gotten up when I called her but was feeling ok at that time however she did report her wt was up 3 lbs and she felt more SOB, she states her ankles were also swollen but she states they have been swelling for about a month.  Ask pt about Coumadin as it was on her med list at last appt and is no longer on it, she states she stopped taking it a while ago b/c she hated going to cvrr.  Discussed all above w/Dr Bensimhon he recommends pt start Eliquis 5 mg BID and be seen this week to possible sch tee/dccv.  Called pt back with his recommendations however she states she was getting ready to call us b/c she is feeling very bad and was going to go to the ER.  She states she is SOB she can not even lay down, she states her ankles are way more swollen than they were this AM and hurt b/c they are so swollen.  She states she just feels very "washed out" and has no energy at all.  At this moment she does not feel dizzy/lightheaded/or like she is going to pass out but she has felt that way earlier.  Advised if feeling that bad should report to ER, Dr Gala RomneyBensimhon is aware.

## 2016-08-23 ENCOUNTER — Other Ambulatory Visit (HOSPITAL_COMMUNITY): Payer: Medicare Other

## 2016-08-23 DIAGNOSIS — I4891 Unspecified atrial fibrillation: Secondary | ICD-10-CM

## 2016-08-23 DIAGNOSIS — N179 Acute kidney failure, unspecified: Secondary | ICD-10-CM

## 2016-08-23 LAB — BASIC METABOLIC PANEL
Anion gap: 12 (ref 5–15)
BUN: 36 mg/dL — ABNORMAL HIGH (ref 6–20)
CO2: 23 mmol/L (ref 22–32)
Calcium: 9 mg/dL (ref 8.9–10.3)
Chloride: 97 mmol/L — ABNORMAL LOW (ref 101–111)
Creatinine, Ser: 2.04 mg/dL — ABNORMAL HIGH (ref 0.44–1.00)
GFR calc Af Amer: 30 mL/min — ABNORMAL LOW (ref >60)
GFR, EST NON AFRICAN AMERICAN: 26 mL/min — AB (ref >60)
GLUCOSE: 158 mg/dL — AB (ref 65–99)
POTASSIUM: 4.4 mmol/L (ref 3.5–5.1)
Sodium: 132 mmol/L — ABNORMAL LOW (ref 135–145)

## 2016-08-23 LAB — HIV ANTIBODY (ROUTINE TESTING W REFLEX): HIV SCREEN 4TH GENERATION: NONREACTIVE

## 2016-08-23 MED ORDER — ETHACRYNIC ACID 25 MG PO TABS
150.0000 mg | ORAL_TABLET | Freq: Two times a day (BID) | ORAL | Status: DC
  Administered 2016-08-23 (×2): 150 mg via ORAL
  Filled 2016-08-23 (×3): qty 6

## 2016-08-23 MED ORDER — SODIUM CHLORIDE 0.9 % IV SOLN: INTRAVENOUS | Status: DC

## 2016-08-23 MED ORDER — SODIUM CHLORIDE 0.9% FLUSH
3.0000 mL | Freq: Two times a day (BID) | INTRAVENOUS | Status: DC
  Administered 2016-08-23 – 2016-08-24 (×2): 3 mL via INTRAVENOUS

## 2016-08-23 MED ORDER — SODIUM CHLORIDE 0.9 % IV SOLN: 250.0000 mL | INTRAVENOUS | Status: DC

## 2016-08-23 MED ORDER — SODIUM CHLORIDE 0.9% FLUSH: 3.0000 mL | INTRAVENOUS | Status: DC | PRN

## 2016-08-23 MED ORDER — APIXABAN 2.5 MG PO TABS
2.5000 mg | ORAL_TABLET | Freq: Two times a day (BID) | ORAL | Status: DC
  Administered 2016-08-23 – 2016-08-28 (×11): 2.5 mg via ORAL
  Filled 2016-08-23 (×11): qty 1

## 2016-08-23 NOTE — Care Management Note (Addendum)
Case Management Note  Patient Details  Name: Jessica MoccasinSherita Stonesifer MRN: [id number] Date of Birth: March 02, 1961  Subjective/Objective: Pt presented for New onset A Fib. Initially started on IV Amio gtt. Benefits Check completed for Eliquis. Pt will need 30 day free card prior to d/c.                     Action/Plan: S/W JAMIE @ [facility] # [id number]    1. ELIQUIS  2.5 MG BID   COVER- YES  CO-PAY-$ 38.70  TIER- 2 DRUG  PRIOR APPROVAL-NO   2. ELIQUIS  5 MG BID   COVER- YES  CO-PAY- $ 40.00  TIER- 2 DRUG  PRIOR APPROVAL- NO   PH[facility] : [facility]   Expected Discharge Date:                  Expected Discharge Plan:  Home/Self Care  In-House Referral:  NA  Discharge planning Services  CM Consult, Medication Assistance  Post Acute Care Choice:    Choice offered to:     DME Arranged:    DME Agency:     HH Arranged:    HH Agency:     Status of Service:  In process, will continue to follow  If discussed at Long Length of Stay Meetings, dates discussed:  08-28-16  Additional Comments: 08-28-16 1001 Tomi BambergerBrenda Graves-Bigelow, RN, BSN [phone] Pt post successful TEE Cardioversion 08-27-16. Pt continues on IV Milrinone and IV Lasix. Pt will continue to monitor for disposition needs.    1508 08-27-16 Tomi BambergerBrenda Graves-Bigelow, RN,BSN [phone] Pt continues on IV Amio gtt, IV Lasix and Milrinone gtt. Cardioversion 08-27-16. Pt has DME RW, Cane and Nebulizer at home. CM will continue to monitor for disposition needs.  Gala LewandowskyGraves-Bigelow, Larya Charpentier Kaye, RN 08/23/2016, 4:55 PM

## 2016-08-23 NOTE — Plan of Care (Signed)
Problem: Safety: Goal: Ability to remain free from injury will improve Outcome: Progressing Patient called for assistance to and from the restroom. Reinforced importance of being aware of lines and monitors.   Problem: Skin Integrity: Goal: Risk for impaired skin integrity will decrease Outcome: Progressing Skin assessment completed. Wound to neck dressed with 4x4 and tape. Small square mepilex covering left chest scabbed incision site.   Problem: Tissue Perfusion: Goal: Risk factors for ineffective tissue perfusion will decrease Outcome: Progressing TED hoses placed BLLE.   Problem: Activity: Goal: Risk for activity intolerance will decrease Outcome: Progressing Patient up with cane to restroom.

## 2016-08-23 NOTE — Progress Notes (Signed)
Advanced Heart Failure Rounding Note   Subjective:    Admitted with new onset A fib and increased dyspnea.   Yesterday started on amio drip.    Overall feeling poorly. SOB with exertion.    Objective:   Weight Range:  Vital Signs:   Temp:  [97.7 F (36.5 C)-98.2 F (36.8 C)] 97.7 F (36.5 C) (06/14 0749) Pulse Rate:  [103-129] 103 (06/14 0749) Resp:  [12-26] 18 (06/14 0749) BP: (87-112)/(64-89) 96/72 (06/14 0749) SpO2:  [95 %-100 %] 95 % (06/14 0749) Weight:  [118 lb 6.4 oz (53.7 kg)-123 lb 4.8 oz (55.9 kg)] 123 lb 4.8 oz (55.9 kg) (06/14 0413) Last BM Date: 08/22/16  Weight change: Filed Weights   08/22/16 1538 08/23/16 0413  Weight: 118 lb 6.4 oz (53.7 kg) 123 lb 4.8 oz (55.9 kg)    Intake/Output:   Intake/Output Summary (Last 24 hours) at 08/23/16 1011 Last data filed at 08/23/16 0655  Gross per 24 hour  Intake           558.08 ml  Output              500 ml  Net            58.08 ml     Physical Exam: General: Chronically ill  appearing. No resp difficulty. In bed HEENT: normal Neck: supple. JVP ~10. Carotids 2+ bilat; no bruits. No lymphadenopathy or thryomegaly appreciated. Cor: PMI nondisplaced. Regular rate & rhythm. No rubs, gallops or murmurs. Lungs: clear Abdomen: soft, nontender, nondistended. No hepatosplenomegaly. No bruits or masses. Good bowel sounds. Extremities: no cyanosis, clubbing, rash, R and LLE trace-1+ edema Neuro: alert & orientedx3, cranial nerves grossly intact. moves all 4 extremities w/o difficulty. Affect pleasant  Telemetry: A Flutter 100s personally reviewed.  Labs: Basic Metabolic Panel:  Recent Labs Lab 08/22/16 1547 08/23/16 0415  NA 135 132*  K 3.9 4.4  CL 96* 97*  CO2 30 23  GLUCOSE 167* 158*  BUN 27* 36*  CREATININE 1.81* 2.04*  CALCIUM 9.4 9.0    Liver Function Tests: No results for input(s): AST, ALT, ALKPHOS, BILITOT, PROT, ALBUMIN in the last 168 hours. No results for input(s): LIPASE, AMYLASE in  the last 168 hours. No results for input(s): AMMONIA in the last 168 hours.  CBC:  Recent Labs Lab 08/22/16 1547  WBC 4.0  HGB 13.9  HCT 41.2  MCV 84.8  PLT 139*    Cardiac Enzymes: No results for input(s): CKTOTAL, CKMB, CKMBINDEX, TROPONINI in the last 168 hours.  BNP: BNP (last 3 results)  Recent Labs  09/28/15 1114  BNP 871.3*    ProBNP (last 3 results) No results for input(s): PROBNP in the last 8760 hours.    Other results:  Imaging: Dg Chest 2 View  Result Date: 08/22/2016 CLINICAL DATA:  Short of breath EXAM: CHEST  2 VIEW COMPARISON:  07/21/2016 FINDINGS: Cardiac enlargement unchanged. AICD with 2 leads extending to the right ventricle are unchanged. Mild vascular congestion without edema or effusion. Negative for pneumonia IMPRESSION: Cardiac enlargement with vascular congestion.  Negative for edema. Electronically Signed   By: Marlan Palauharles  Clark M.D.   On: 08/22/2016 16:22      Medications:     Scheduled Medications: . apixaban  5 mg Oral BID  . aspirin EC  81 mg Oral Daily  . furosemide  40 mg Intravenous Q12H  . magnesium oxide  400 mg Oral Daily  . potassium chloride SA  40 mEq Oral BID  .  sildenafil  20 mg Oral TID  . sodium chloride flush  3 mL Intravenous Q12H     Infusions: . sodium chloride    . amiodarone 30 mg/hr (08/23/16 0655)     PRN Medications:  sodium chloride, acetaminophen, albuterol, ondansetron (ZOFRAN) IV, sodium chloride flush   Assessment/Plan/Discussion:   Jessica [name] is a 56 year old with h/o chronic systolic heart failure, ICM, Biotronik ICD, and PAH admitted today with new onset A Fib/Flutter  1. A fib/flutter RVR New Onset- June 11 with device interrogation.  A Fib identified on device interrogation and on EKG in the ED. Need to try and restore NSR.  Remains in  Fib/flutter. Continue to load amio. Continue eliquis 5mg  twice a day.  Set up TEE/DC-CV.  2. A/C Systolic Heart Failure- ICM. ECHO 10/2015 EF  20-25%.NYHA III-IV. Biotronic ICD  Refuses IV lasix. Restart home edecrin 150 mg twice a day.  Creatinine trending up. Watch closely. May represent low output heart failure.  No bb as she has been intolerant in the past with fatigue and hypotension. .  No ace with cough. Repeat ECHO  3. PAH- continue sildenafil 20 mg three times a day.  4. CAD S/P multiple interventions. Continues asa.  No nitro with sildenafil. No CP.  5. PAD - occluded left SFA with mild/mod ABi. No revascularization was needed October 2017. 6. H/O LV Thrombus- repeat ECHO   Length of Stay: 1  Jessica Clegg NP-C  08/23/2016, 10:11 AM  Advanced Heart Failure Team Pager [phone] (M-F; 7a - 4p)  Please contact [facility] for night-coverage after hours (4p -7a ) and weekends on [email or web address]  Patient seen and examined with [name], NP. We discussed all aspects of the encounter. I agree with the assessment and plan as stated above.   Remains in AFL. Rate control improved with IV amio. Continue Eliquis. Volume status improving. Refusing lasix. Now back on edecrin.   Echo pending for today.   CAD stable. No ischemia  Plan for TEE/DC-CV as soon as spot available in endo.   Arvilla Meres, MD  6:22 PM

## 2016-08-23 NOTE — Progress Notes (Signed)
Upon skin assessment, patient stated her wound/blisters/scars came from a drug reaction with furosemide and torsemide. Lasix currently in active orders. Cardiology covering paged. MD advised to not give Lasix this shift and primary team will address in morning.

## 2016-08-24 ENCOUNTER — Inpatient Hospital Stay (HOSPITAL_COMMUNITY): Payer: Medicare Other

## 2016-08-24 ENCOUNTER — Encounter (HOSPITAL_COMMUNITY): Payer: Self-pay | Admitting: *Deleted

## 2016-08-24 DIAGNOSIS — I340 Nonrheumatic mitral (valve) insufficiency: Secondary | ICD-10-CM

## 2016-08-24 DIAGNOSIS — I483 Typical atrial flutter: Secondary | ICD-10-CM

## 2016-08-24 LAB — ECHOCARDIOGRAM COMPLETE
AO mean calculated velocity dopler: 57 cm/s
AOPV: 0.64 m/s
AV Mean grad: 2 mmHg
AV Peak grad: 5 mmHg
AV pk vel: 108 cm/s
AVAREAMEANV: 1.76 cm2
AVAREAMEANVIN: 1.08 cm2/m2
AVAREAVTI: 1.63 cm2
AVAREAVTIIND: 0.98 cm2/m2
AVCELMEANRAT: 0.69
Ao-asc: 28 cm
CHL CUP AV PEAK INDEX: 1
CHL CUP AV VEL: 1.6
CHL CUP DOP CALC LVOT VTI: 8.76 cm
FS: 24 % — AB (ref 28–44)
Height: 65 in
IV/PV OW: 0.96
LA diam index: 2.63 cm/m2
LA vol A4C: 62.6 ml
LA vol: 64 mL
LASIZE: 43 mm
LAVOLIN: 39.2 mL/m2
LDCA: 2.54 cm2
LEFT ATRIUM END SYS DIAM: 43 mm
LV PW d: 10.4 mm — AB (ref 0.6–1.1)
LVOT SV: 22 mL
LVOT diameter: 18 mm
LVOTPV: 69.4 cm/s
LVOTVTI: 0.63 cm
PISA EROA: 0.06 cm2
PV Reg grad dias: 8 mmHg
PV Reg vel dias: 141 cm/s
RV sys press: 71 mmHg
Reg peak vel: 374 cm/s
TAPSE: 8.76 mm
TR max vel: 374 cm/s
VTI: 108 cm
VTI: 13.9 cm
Valve area index: 0.98
Valve area: 1.6 cm2
Weight: 2048 oz

## 2016-08-24 LAB — BASIC METABOLIC PANEL
Anion gap: 11 (ref 5–15)
BUN: 41 mg/dL — ABNORMAL HIGH (ref 6–20)
CHLORIDE: 94 mmol/L — AB (ref 101–111)
CO2: 25 mmol/L (ref 22–32)
Calcium: 9.1 mg/dL (ref 8.9–10.3)
Creatinine, Ser: 2.25 mg/dL — ABNORMAL HIGH (ref 0.44–1.00)
GFR calc Af Amer: 27 mL/min — ABNORMAL LOW (ref >60)
GFR calc non Af Amer: 23 mL/min — ABNORMAL LOW (ref >60)
GLUCOSE: 173 mg/dL — AB (ref 65–99)
POTASSIUM: 4.9 mmol/L (ref 3.5–5.1)
Sodium: 130 mmol/L — ABNORMAL LOW (ref 135–145)

## 2016-08-24 LAB — GLUCOSE, CAPILLARY: Glucose-Capillary: 189 mg/dL — ABNORMAL HIGH (ref 65–99)

## 2016-08-24 MED ORDER — FUROSEMIDE 10 MG/ML IJ SOLN
80.0000 mg | Freq: Once | INTRAMUSCULAR | Status: AC
  Administered 2016-08-24: 80 mg via INTRAVENOUS
  Filled 2016-08-24: qty 8

## 2016-08-24 MED ORDER — ETHACRYNIC ACID 25 MG PO TABS: 150.0000 mg | ORAL_TABLET | Freq: Once | ORAL | Status: DC

## 2016-08-24 MED ORDER — METOLAZONE 5 MG PO TABS
2.5000 mg | ORAL_TABLET | Freq: Once | ORAL | Status: AC
  Administered 2016-08-24: 2.5 mg via ORAL
  Filled 2016-08-24: qty 1

## 2016-08-24 MED ORDER — ETHACRYNIC ACID 25 MG PO TABS
200.0000 mg | ORAL_TABLET | Freq: Two times a day (BID) | ORAL | Status: DC
  Filled 2016-08-24 (×3): qty 8

## 2016-08-24 MED ORDER — FUROSEMIDE 10 MG/ML IJ SOLN
80.0000 mg | Freq: Two times a day (BID) | INTRAMUSCULAR | Status: DC
Start: 2016-08-24 — End: 2016-08-28
  Administered 2016-08-24 – 2016-08-27 (×6): 80 mg via INTRAVENOUS
  Filled 2016-08-24 (×7): qty 8

## 2016-08-24 MED ORDER — MILRINONE LACTATE IN DEXTROSE 20-5 MG/100ML-% IV SOLN
0.2500 ug/kg/min | INTRAVENOUS | Status: DC
  Administered 2016-08-24 – 2016-08-27 (×4): 0.25 ug/kg/min via INTRAVENOUS
  Filled 2016-08-24 (×4): qty 100

## 2016-08-24 MED ORDER — SODIUM CHLORIDE 0.9% FLUSH
10.0000 mL | INTRAVENOUS | Status: DC | PRN
  Administered 2016-09-26 – 2016-09-30 (×2): 10 mL
  Filled 2016-08-24 (×2): qty 40

## 2016-08-24 MED ORDER — SODIUM CHLORIDE 0.9% FLUSH
10.0000 mL | Freq: Two times a day (BID) | INTRAVENOUS | Status: DC
  Administered 2016-08-24: 30 mL
  Administered 2016-08-25 – 2016-08-27 (×5): 10 mL
  Administered 2016-08-28: 30 mL
  Administered 2016-08-29 – 2016-09-12 (×18): 10 mL
  Administered 2016-09-13: 20 mL
  Administered 2016-09-15 – 2016-09-16 (×3): 10 mL
  Administered 2016-09-17: 20 mL
  Administered 2016-09-17 – 2016-09-18 (×2): 10 mL
  Administered 2016-09-18: 20 mL
  Administered 2016-09-20: 10 mL
  Administered 2016-09-22: 20 mL
  Administered 2016-09-23 – 2016-10-01 (×13): 10 mL

## 2016-08-24 NOTE — Progress Notes (Signed)
RN tried to give pt her Ethacrynic acid today but pt refused saying she took 150mg  of Ethacrynic acid form home. Pt stated she was giving her medication back to her son to take home. Pt educated on the importance of only taking medication given by hospital staff. Will cont to monitor pt.

## 2016-08-24 NOTE — Care Management Note (Addendum)
Case Management Note  Patient Details  Name: Jessica [name] MRN: [id number] Date of Birth: 1960-04-02  Subjective/Objective: Pt presented for New onset A Fib. Initially started on IV Amio gtt. Benefits Check completed for Eliquis.  NCM gave patient Eliquis 30 day savings coupon. She state she will be going to [facility] to get 30 day free medication. Patient has rolling walker and a cane that she uses, she also has a nebulizer machine at home.  [facility], she states she has had this before and does not want HH services.    PCP -[facility]- new patient apt scheduled for 6/25 at 11 am.                  Action/Plan: NCM will follow for dc needs.   Expected Discharge Date:                  Expected Discharge Plan:  Home/Self Care  In-House Referral:  NA  Discharge planning Services  CM Consult, Medication Assistance  Post Acute Care Choice:    Choice offered to:     DME Arranged:    DME Agency:     HH Arranged:    HH Agency:     Status of Service:  Completed, signed off  If discussed at MicrosoftLong Length of Stay Meetings, dates discussed:    Additional Comments:  Leone Havenaylor, Rendi Mapel Clinton, RN 08/24/2016, 9:05 AM

## 2016-08-24 NOTE — Progress Notes (Signed)
  Echocardiogram 2D Echocardiogram has been performed.  Jessica [name] 08/24/2016, 4:34 PM

## 2016-08-24 NOTE — Discharge Instructions (Addendum)

## 2016-08-24 NOTE — Progress Notes (Signed)
Peripherally Inserted Central Catheter/Midline Placement  The IV Nurse has discussed with the patient and/or persons authorized to consent for the patient, the purpose of this procedure and the potential benefits and risks involved with this procedure.  The benefits include less needle sticks, lab draws from the catheter, and the patient may be discharged home with the catheter. Risks include, but not limited to, infection, bleeding, blood clot (thrombus formation), and puncture of an artery; nerve damage and irregular heartbeat and possibility to perform a PICC exchange if needed/ordered by physician.  Alternatives to this procedure were also discussed.  Bard Power PICC patient education guide, fact sheet on infection prevention and patient information card has been provided to patient /or left at bedside.    PICC/Midline Placement Documentation  PICC Double Lumen 08/24/16 PICC Right Basilic 38 cm 0 cm (Active)  Indication for Insertion or Continuance of Line Vasoactive infusions 08/24/2016  7:00 PM  Exposed Catheter (cm) 0 cm 08/24/2016  7:00 PM  Dressing Change Due 08/31/16 08/24/2016  7:00 PM       Stacie GlazeJoyce, Aleksandar Duve Horton 08/24/2016, 7:07 PM

## 2016-08-24 NOTE — Progress Notes (Signed)
Advanced Heart Failure Rounding Note   Subjective:    Admitted with new onset A fib and increased dyspnea.   Remains on amio drip. Still in A flutter/fib. Weight trending.   Complaining of SOB with exertion and fatigue.  Coughing a lot.    Objective:   Weight Range:  Vital Signs:   Temp:  [97.6 F (36.4 C)-98 F (36.7 C)] 98 F (36.7 C) (06/15 0506) Pulse Rate:  [92-100] 95 (06/15 0506) Resp:  [18-20] 18 (06/14 1605) BP: (84-99)/(52-73) 98/70 (06/15 0506) SpO2:  [94 %-99 %] 96 % (06/15 0506) Weight:  [128 lb (58.1 kg)] 128 lb (58.1 kg) (06/15 0506) Last BM Date: 08/22/16  Weight change: Filed Weights   08/22/16 1538 08/23/16 0413 08/24/16 0506  Weight: 118 lb 6.4 oz (53.7 kg) 123 lb 4.8 oz (55.9 kg) 128 lb (58.1 kg)    Intake/Output:   Intake/Output Summary (Last 24 hours) at 08/24/16 0845 Last data filed at 08/23/16 2249  Gross per 24 hour  Intake          1045.81 ml  Output              400 ml  Net           645.81 ml     Physical Exam: General:  Chronically ill. No resp difficulty HEENT: normal Neck: supple. JVP to jaw.  Carotids 2+ bilat; no bruits. No lymphadenopathy or thryomegaly appreciated. Cor: PMI nondisplaced. Tachy Regular rate & rhythm. No rubs, gallops or murmurs. Lungs: clear Abdomen: soft, nontender, nondistended. No hepatosplenomegaly. No bruits or masses. Good bowel sounds. Extremities: no cyanosis, clubbing, rash, Rand LLE trace-1+edema Neuro: alert & orientedx3, cranial nerves grossly intact. moves all 4 extremities w/o difficulty. Affect pleasant SKin: multiple scars on back/legs   Telemetry: A flutter 90-100s personally reviewed.   Labs: Basic Metabolic Panel:  Recent Labs Lab 08/22/16 1547 08/23/16 0415 08/24/16 0421  NA 135 132* 130*  K 3.9 4.4 4.9  CL 96* 97* 94*  CO2 30 23 25   GLUCOSE 167* 158* 173*  BUN 27* 36* 41*  CREATININE 1.81* 2.04* 2.25*  CALCIUM 9.4 9.0 9.1    Liver Function Tests: No results for  input(s): AST, ALT, ALKPHOS, BILITOT, PROT, ALBUMIN in the last 168 hours. No results for input(s): LIPASE, AMYLASE in the last 168 hours. No results for input(s): AMMONIA in the last 168 hours.  CBC:  Recent Labs Lab 08/22/16 1547  WBC 4.0  HGB 13.9  HCT 41.2  MCV 84.8  PLT 139*    Cardiac Enzymes: No results for input(s): CKTOTAL, CKMB, CKMBINDEX, TROPONINI in the last 168 hours.  BNP: BNP (last 3 results)  Recent Labs  09/28/15 1114  BNP 871.3*    ProBNP (last 3 results) No results for input(s): PROBNP in the last 8760 hours.    Other results:  Imaging: Dg Chest 2 View  Result Date: 08/22/2016 CLINICAL DATA:  Short of breath EXAM: CHEST  2 VIEW COMPARISON:  07/21/2016 FINDINGS: Cardiac enlargement unchanged. AICD with 2 leads extending to the right ventricle are unchanged. Mild vascular congestion without edema or effusion. Negative for pneumonia IMPRESSION: Cardiac enlargement with vascular congestion.  Negative for edema. Electronically Signed   By: Marlan Palauharles  Clark M.D.   On: 08/22/2016 16:22     Medications:     Scheduled Medications: . apixaban  2.5 mg Oral BID  . aspirin EC  81 mg Oral Daily  . ethacrynic acid  150 mg Oral BID  .  magnesium oxide  400 mg Oral Daily  . potassium chloride SA  40 mEq Oral BID  . sildenafil  20 mg Oral TID  . sodium chloride flush  3 mL Intravenous Q12H  . sodium chloride flush  3 mL Intravenous Q12H    Infusions: . sodium chloride    . sodium chloride    . sodium chloride    . sodium chloride    . amiodarone 30 mg/hr (08/24/16 0526)    PRN Medications: sodium chloride, acetaminophen, albuterol, ondansetron (ZOFRAN) IV, sodium chloride flush, sodium chloride flush   Assessment/Plan/Discussion:   Jessica [name] is a 56 year old with h/o chronic systolic heart failure, ICM, Biotronik ICD, and PAH admitted today with new onset A Fib/Flutter  1. A fib/flutter RVR New Onset- June 11 with device interrogation.  A Fib  identified on device interrogation and on EKG in the ED. Need to try and restore NSR.  Remains in A flutter. Discussed with EP. Plan for ablation. Formal consult pending.    Continue to load amio. Eliquis cut back to 2.5 mg twice a day.   If no ablation will need TE/DC-CV  2. A/C Systolic Heart Failure- ICM. ECHO 10/2015 EF 20-25%.NYHA III-IV. Biotronic ICD  She is agreeable to IV lasix. Start 80 mg IV lasix twice daily. Add milrinone 0.25 mcg. Stop edecrine.  Creatinine continues to trend up 2.0>2.25.  May represent low output heart failure. May need to add inotropes.  No bb as she has been intolerant in the past with fatigue and hypotension. .  No ace with cough.  Repeat ECHO  3. PAH- continue sildenafil 20 mg three times a day.  4. CAD S/P multiple interventions. Continues asa.  No nitro with sildenafil. No CP.  5. PAD - occluded left SFA with mild/mod ABi. No revascularization was needed October 2017. 6. H/O LV Thrombus- repeat ECHO  7. CKD Stage IV- Creatinine trending up 1.8>2.0>2.25.   Dr Gala Romney reviewed ECHO at bedside. EF ~10 RV down as well. Will need CVp CO-OX   Consult cardiac rehab.   Length of Stay: 2  Amy Clegg NP-C  08/24/2016, 8:45 AM  Advanced Heart Failure Team Pager [phone] (M-F; 7a - 4p)  Please contact [facility] for night-coverage after hours (4p -7a ) and weekends on [email or web address]  Patient seen and examined with [name], NP. We discussed all aspects of the encounter. I agree with the assessment and plan as stated above.   She is progressively ill with worsening volume overload and SOB. Renal function worse.   Echo images reviewed personally LVEF 5-10% with severe RV HK. Will start milrinone and restart IV lasix. Continue amio for AFL.   If we can stabilize her will consider AFL ablation. Continue Eliquis. D/w Dr. Ladona Ridgel.   Low threshold to move to ICU.   Arvilla Meres, MD  4:03 PM

## 2016-08-25 ENCOUNTER — Encounter (HOSPITAL_COMMUNITY): Payer: Self-pay | Admitting: *Deleted

## 2016-08-25 DIAGNOSIS — R570 Cardiogenic shock: Secondary | ICD-10-CM

## 2016-08-25 LAB — BASIC METABOLIC PANEL
Anion gap: 11 (ref 5–15)
BUN: 46 mg/dL — ABNORMAL HIGH (ref 6–20)
CO2: 24 mmol/L (ref 22–32)
CREATININE: 2.57 mg/dL — AB (ref 0.44–1.00)
Calcium: 8.7 mg/dL — ABNORMAL LOW (ref 8.9–10.3)
Chloride: 94 mmol/L — ABNORMAL LOW (ref 101–111)
GFR calc non Af Amer: 20 mL/min — ABNORMAL LOW (ref >60)
GFR, EST AFRICAN AMERICAN: 23 mL/min — AB (ref >60)
Glucose, Bld: 196 mg/dL — ABNORMAL HIGH (ref 65–99)
Potassium: 4.4 mmol/L (ref 3.5–5.1)
SODIUM: 129 mmol/L — AB (ref 135–145)

## 2016-08-25 LAB — COOXEMETRY PANEL
Carboxyhemoglobin: 1.7 % — ABNORMAL HIGH (ref 0.5–1.5)
Methemoglobin: 1.1 % (ref 0.0–1.5)
O2 Saturation: 60.4 %
TOTAL HEMOGLOBIN: 11.8 g/dL — AB (ref 12.0–16.0)

## 2016-08-25 LAB — MAGNESIUM: Magnesium: 2.3 mg/dL (ref 1.7–2.4)

## 2016-08-25 NOTE — Progress Notes (Addendum)
Advanced Heart Failure Rounding Note   Subjective:    Admitted with new onset atrial fltutter and increased dyspnea.   Milrinone started yesterday for worsening HF and renal failue  Feels much better. Orthopnea resolved. Urine output picked up however creatinine worse.   On amio drip. Remains in AFL HR 100-110. Co-ox 60% CVP 12-13  Objective:   Weight Range:  Vital Signs:   Temp:  [97.6 F (36.4 C)-98.7 F (37.1 C)] 97.8 F (36.6 C) (06/16 0727) Pulse Rate:  [93-105] 105 (06/16 0727) Resp:  [18-31] 20 (06/16 0727) BP: (88-132)/(59-76) 95/59 (06/16 0727) SpO2:  [87 %-100 %] 93 % (06/16 0727) Weight:  [56.7 kg (124 lb 14.4 oz)] 56.7 kg (124 lb 14.4 oz) (06/16 0458) Last BM Date: 08/22/16  Weight change: Filed Weights   08/23/16 0413 08/24/16 0506 08/25/16 0458  Weight: 55.9 kg (123 lb 4.8 oz) 58.1 kg (128 lb) 56.7 kg (124 lb 14.4 oz)    Intake/Output:   Intake/Output Summary (Last 24 hours) at 08/25/16 0950 Last data filed at 08/25/16 0654  Gross per 24 hour  Intake           533.53 ml  Output              800 ml  Net          -266.47 ml     Physical Exam: General:  Lying in bed. NAD HEENT: normal poor dentition Neck: supple. JVP to jaw Carotids 2+ bilat; no bruits. No lymphadenopathy or thryomegaly appreciated. Cor: PMI laterally displaced. RRR tachy + s3 Lungs: clear Abdomen: soft, nontender, nondistended. No hepatosplenomegaly. No bruits or masses. Good bowel sounds. Extremities: no cyanosis, clubbing, rash, trace edema Neuro: alert & orientedx3, cranial nerves grossly intact. moves all 4 extremities w/o difficulty. Affect pleasant    Telemetry: A flutter 100-105 Personally reviewed   Labs: Basic Metabolic Panel:  Recent Labs Lab 08/22/16 1547 08/23/16 0415 08/24/16 0421 08/25/16 0351  NA 135 132* 130* 129*  K 3.9 4.4 4.9 4.4  CL 96* 97* 94* 94*  CO2 30 23 25 24   GLUCOSE 167* 158* 173* 196*  BUN 27* 36* 41* 46*  CREATININE 1.81* 2.04*  2.25* 2.57*  CALCIUM 9.4 9.0 9.1 8.7*  MG  --   --   --  2.3    Liver Function Tests: No results for input(s): AST, ALT, ALKPHOS, BILITOT, PROT, ALBUMIN in the last 168 hours. No results for input(s): LIPASE, AMYLASE in the last 168 hours. No results for input(s): AMMONIA in the last 168 hours.  CBC:  Recent Labs Lab 08/22/16 1547  WBC 4.0  HGB 13.9  HCT 41.2  MCV 84.8  PLT 139*    Cardiac Enzymes: No results for input(s): CKTOTAL, CKMB, CKMBINDEX, TROPONINI in the last 168 hours.  BNP: BNP (last 3 results)  Recent Labs  09/28/15 1114  BNP 871.3*    ProBNP (last 3 results) No results for input(s): PROBNP in the last 8760 hours.    Other results:  Imaging: Dg Chest Port 1 View  Result Date: 08/24/2016 CLINICAL DATA:  Confirm PICC line EXAM: PORTABLE CHEST 1 VIEW COMPARISON:  None. FINDINGS: Stable cardiomegaly. Mild vascular congestion. PICC line tip from right-sided approach is noted at the cavoatrial junction. Left-sided ICD device projects over the left shoulder with leads in the right ventricle. No acute nor suspicious osseous abnormality. IMPRESSION: 1. Right-sided PICC line is noted with tip at the cavoatrial junction. 2. Stable cardiomegaly with mild vascular  congestion. Electronically Signed   By: Tollie Eth M.D.   On: 08/24/2016 20:08     Medications:     Scheduled Medications: . apixaban  2.5 mg Oral BID  . aspirin EC  81 mg Oral Daily  . furosemide  80 mg Intravenous BID  . magnesium oxide  400 mg Oral Daily  . potassium chloride SA  40 mEq Oral BID  . sildenafil  20 mg Oral TID  . sodium chloride flush  10-40 mL Intracatheter Q12H  . sodium chloride flush  3 mL Intravenous Q12H  . sodium chloride flush  3 mL Intravenous Q12H    Infusions: . sodium chloride    . sodium chloride    . sodium chloride    . sodium chloride    . amiodarone 30 mg/hr (08/25/16 0442)  . milrinone 0.25 mcg/kg/min (08/25/16 0442)    PRN Medications: sodium  chloride, acetaminophen, albuterol, ondansetron (ZOFRAN) IV, sodium chloride flush, sodium chloride flush, sodium chloride flush   Assessment/Plan/Discussion:   Jessica [name] is a 56 year old with h/o chronic systolic heart failure, ICM, Biotronik ICD, and PAH admitted today with new onset A Fib/Flutter  1. A flutter RVR New Onset- June 11 with device interrogation.  - Remains in AFL. Rate improved on amio gtt. Once stabilized will need DC-CV versus AF ablatio. Discussed with Dr. Ladona Ridgel. Continue amio and Eliquis 2. A/C Systolic Heart Failure- ICM. ECHO 10/2015 EF 20-25%. -> cardiogenic shock - milrinone started 6/15 for cardiogenic shock and cardiorenal syndrome  - feels much better. Co-ox now 60% CVP remains 12-13 - echo 08/24/16. LVEF 10% severe RV HK (EF down further in setting of AFL) - Continue milrinone. Continue IV diuresis. Watch renal function closely. - No bb or ACE/ARB/ARNI with low output and AKI .  3. 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  - Watch creatinine closely 4. PAH - continue sildenafil 20 mg three times a day.  5. CAD S/P multiple interventions.  - No s/s of ischemia - Continues asa.  No nitro with sildenafil.  6. PAD  - occluded left SFA with mild/mod ABi. No revascularization was needed October 2017. 7. H/O LV Thrombus - not seen on current echo (reviewed personally) 8. Hyponatremia - free water restrict   Length of Stay: 3  Jessica Anding MD 08/25/2016, 9:50 AM  Advanced Heart Failure Team Pager [phone] (M-F; 7a - 4p)  Please contact [facility] for night-coverage after hours (4p -7a ) and weekends on [email or web address]  .

## 2016-08-25 NOTE — Progress Notes (Signed)
Pt's PICC is still in place and intact but has a moderate amount of blood under the dressing. IV team called and they recommended to reinforce the PICC for now. PICC dressing reinforced. Will cont to monitor pt.

## 2016-08-25 NOTE — Consult Note (Addendum)
Cardiology Consultation:   Patient ID: Jessica MoccasinSherita [name]; [id number]; 1960-04-13   Admit date: 08/22/2016 Date of Consult: 08/25/2016  Primary Care Provider: Patient, No Pcp Per Primary Cardiologist: Dr. Dorthea CoveB Primary Electrophysiologist:  Ladona Ridgelaylor   Patient Profile:   Jessica MoccasinSherita [name] is a 56 y.o. female with a hx of Nonischemic cardiomyopathy, chronic systolic heart failure, status post ICD implantation who is being seen today for the evaluation of atrial flutter with a rapid ventricular response at the request of Dr. Dorthea CoveB.  History of Present Illness:   Jessica [name] 56 year old woman with a long-standing nonischemic cardiomyopathy, chronic systolic heart failure, a remote history of noncompliance, and status post ICD insertion. She has a history of ventricular tachycardia with appropriate ICD shocks. She was in her usual state of health when she presented with atrial flutter and a rapid ventricular response in conjunction with decompensated heart failure. She has been fairly refractory to medical therapy although she has had some improvement in her heart failure symptoms. She denied recent ICD shock and denies palpitations. She denies recent syncope. She has had severe fatigue and weakness.  Past Medical History:  Diagnosis Date  . AICD (automatic cardioverter/defibrillator) present    a. Biotronik placed 2009. b. ICD discharge 2011, 03/2013.  [name]. Alcohol abuse   . Anxiety   . Arterial embolism (HCC)    a. H/o RLE extremity ischemia in 2012 s/p right iliofemoral embolectomy with compartment fasciotomy.  . Asthma   . CAD (coronary artery disease)    a. 1996: anterior wall MI tx with PTCA. b. 2003: PCI/DES to circumflex marginal. c. 2004: PTCA/CBA/stenting of ostium of D1 and LAD. d. 06/2003: s/p PTCA/DES of mRCA. e. 11/2003: CBA of bifurcation diagonal LAD. f. 2007: PTCA/DES to RCA, CBA/stenting to OM2.  . Chronic systolic heart failure (HCC)    a.  secondary to severe ischemic cardiomyopathy with  EF 20-25%;  b. s/p rimplantation of Biotronik single chamber ICD by Dr Ladona Ridgelaylor 02/2008;  c.  RHC 07/2013 showed well compensated left sided pressures with moderate PAH and low cardiac output. d. 2D Echo 03/2014: EF 20-25%, grade 2 DD, PAP 58mmHg.  . CKD (chronic kidney disease), stage II   . COPD (chronic obstructive pulmonary disease) (HCC)   . Diabetes mellitus, type 2 (HCC)   . Dizziness   . DVT (deep venous thrombosis) (HCC)    a. 06/2010.  [name]. GERD (gastroesophageal reflux disease)   . History of noncompliance with medical treatment   . HLD (hyperlipidemia)   . LV (left ventricular) mural thrombus    a. Dx on echo 03/2014.  [name]. Myocardial infarction (HCC)   . Pulmonary hypertension (HCC)   . Sleep apnea   . Tobacco abuse   . V-tach [facility](HCC)     Past Surgical History:  Procedure Laterality Date  . CARDIAC CATHETERIZATION    . DIAGNOSTIC LAPAROSCOPY    . ICD GENERATOR CHANGEOUT N/A 07/20/2016   Procedure: ICD Generator Changeout;  Surgeon: Marinus Mawaylor, Loreley Schwall W, MD;  [facility];  Service: Cardiovascular;  Laterality: N/A;  . INSERT / REPLACE / REMOVE PACEMAKER    . RIGHT HEART CATHETERIZATION N/A 07/23/2013   Procedure: RIGHT HEART CATH;  Surgeon: Dolores Pattyaniel R Bensimhon, MD;  [facility];  Service: Cardiovascular;  Laterality: N/A;  . TUBAL LIGATION       Inpatient Medications: Scheduled Meds: . apixaban  2.5 mg Oral BID  . aspirin EC  81 mg Oral Daily  . furosemide  80 mg Intravenous  BID  . magnesium oxide  400 mg Oral Daily  . potassium chloride SA  40 mEq Oral BID  . sildenafil  20 mg Oral TID  . sodium chloride flush  10-40 mL Intracatheter Q12H  . sodium chloride flush  3 mL Intravenous Q12H  . sodium chloride flush  3 mL Intravenous Q12H   Continuous Infusions: . sodium chloride    . sodium chloride    . sodium chloride    . sodium chloride    . amiodarone 30 mg/hr (08/25/16 0442)  . milrinone 0.25 mcg/kg/min (08/25/16 0442)   PRN Meds: sodium  chloride, acetaminophen, albuterol, ondansetron (ZOFRAN) IV, sodium chloride flush, sodium chloride flush, sodium chloride flush  Allergies:    Allergies  Allergen Reactions  . Nyquil Multi-Symptom [Pseudoeph-Doxylamine-Dm-Apap] Other (See Comments)    Fatigue, chest pressure and pain  . Sudafed [Pseudoephedrine] Other (See Comments)    Fatigue, chest pressure and pain  . Lasix [Furosemide]     Rash  . Torsemide     Rash  . Zocor [Simvastatin]     Myalgias   . Latex Rash  . Ramipril Cough    Social History:   Social History   Social History  . Marital status: Divorced    Spouse name: N/A  . Number of children: N/A  . Years of education: N/A   Occupational History  . disabled Disability   Social History Main Topics  . Smoking status: Current Some Day Smoker    Packs/day: 0.25    Years: 21.00    Types: Cigars  . Smokeless tobacco: Never Used     Comment: Smokes 2-3 cigars daily, quit smoking cigarettes 3 yrs ago.   . Alcohol use No  . Drug use: No  . Sexual activity: No   Other Topics Concern  . Not on file   Social History Narrative  . No narrative on file    Family History:   The patient's family history is notable for CAD.  ROS:  Please see the history of present illness.  ROS  All other ROS reviewed and negative.     Physical Exam/Data:   Vitals:   08/25/16 0016 08/25/16 0458 08/25/16 0727 08/25/16 1222  BP: 101/69 93/67 (!) 95/59 (!) 109/95  Pulse: 100 (!) 102 (!) 105 (!) 104  Resp: 18 (!) 31 20 (!) 31  Temp: 98.7 F (37.1 C) 98.4 F (36.9 C) 97.8 F (36.6 C) 98.3 F (36.8 C)  TempSrc: Oral Oral Oral Oral  SpO2: 93% (!) 87% 93% 97%  Weight:  124 lb 14.4 oz (56.7 kg)    Height:        Intake/Output Summary (Last 24 hours) at 08/25/16 1510 Last data filed at 08/25/16 1400  Gross per 24 hour  Intake           653.53 ml  Output             1500 ml  Net          -846.47 ml   Filed Weights   08/23/16 0413 08/24/16 0506 08/25/16 0458    Weight: 123 lb 4.8 oz (55.9 kg) 128 lb (58.1 kg) 124 lb 14.4 oz (56.7 kg)   Body mass index is 20.78 kg/m.  General:  Chronically ill-appearing, well developed, in no acute distress HEENT: normal, with oropharynx moist Lymph: no adenopathy Neck: 8 cm JVD Endocrine:  No thryomegaly Vascular: No carotid bruits; FA pulses 2+ bilaterally without bruits  Cardiac:  normal S1, S2;  regular tachycardia; soft systolic murmur  Lungs:  clear to auscultation bilaterally, no wheezing, rhonchi or rales  Abd: soft, nontender, no hepatomegaly  Ext: no edema Musculoskeletal:  No deformities, BUE and BLE strength normal and equal Skin: warm and dry  Neuro:  CNs 2-12 intact, no focal abnormalities noted Psych:  Normal affect    EKG:  The EKG was personally reviewed and demonstrates atrial flutter with 21 AV conduction  Relevant CV Studies: 2-D echo demonstrates EF of 15%  Laboratory Data:  Chemistry Recent Labs Lab 08/23/16 0415 08/24/16 0421 08/25/16 0351  NA 132* 130* 129*  K 4.4 4.9 4.4  CL 97* 94* 94*  CO2 23 25 24   GLUCOSE 158* 173* 196*  BUN 36* 41* 46*  CREATININE 2.04* 2.25* 2.57*  CALCIUM 9.0 9.1 8.7*  GFRNONAA 26* 23* 20*  GFRAA 30* 27* 23*  ANIONGAP 12 11 11     No results for input(s): PROT, ALBUMIN, AST, ALT, ALKPHOS, BILITOT in the last 168 hours. Hematology Recent Labs Lab 08/22/16 1547  WBC 4.0  RBC 4.86  HGB 13.9  HCT 41.2  MCV 84.8  MCH 28.6  MCHC 33.7  RDW 16.9*  PLT 139*   Cardiac EnzymesNo results for input(s): TROPONINI in the last 168 hours.  Recent Labs Lab 08/22/16 1604  TROPIPOC 0.12*    BNPNo results for input(s): BNP, PROBNP in the last 168 hours.  DDimer No results for input(s): DDIMER in the last 168 hours.  Radiology/Studies:  Dg Chest 2 View  Result Date: 08/22/2016 CLINICAL DATA:  Short of breath EXAM: CHEST  2 VIEW COMPARISON:  07/21/2016 FINDINGS: Cardiac enlargement unchanged. AICD with 2 leads extending to the right ventricle  are unchanged. Mild vascular congestion without edema or effusion. Negative for pneumonia IMPRESSION: Cardiac enlargement with vascular congestion.  Negative for edema. Electronically Signed   By: Marlan Palau M.D.   On: 08/22/2016 16:22   Dg Chest Port 1 View  Result Date: 08/24/2016 CLINICAL DATA:  Confirm PICC line EXAM: PORTABLE CHEST 1 VIEW COMPARISON:  None. FINDINGS: Stable cardiomegaly. Mild vascular congestion. PICC line tip from right-sided approach is noted at the cavoatrial junction. Left-sided ICD device projects over the left shoulder with leads in the right ventricle. No acute nor suspicious osseous abnormality. IMPRESSION: 1. Right-sided PICC line is noted with tip at the cavoatrial junction. 2. Stable cardiomegaly with mild vascular congestion. Electronically Signed   By: Tollie Eth M.D.   On: 08/24/2016 20:08    Assessment and Plan:   1. Atrial flutter with a rapid ventricular response - the patient's ventricular rate has been decreased slightly as her atrial rate has been reduced on amiodarone. She will continue her current medications. She appears to be tolerating Eliquis. 2. Acute on chronic systolic heart failure - her symptoms have worsened with the initiation of atrial flutter. She is undergoing maximal medical therapy under the direction of our heart failure service. I do think her heart failure will be improved when she returns to sinus rhythm, if she can be maintained in sinus rhythm. For now she will continue her current medical therapy. 3. ICD - she has a history of ventricular tachycardia and fibrillation but has had no recent ICD therapies.  Lewayne Bunting, M.D.   Signed, Lewayne Bunting, MD  08/25/2016 3:10 PM

## 2016-08-26 DIAGNOSIS — N170 Acute kidney failure with tubular necrosis: Secondary | ICD-10-CM

## 2016-08-26 DIAGNOSIS — N183 Chronic kidney disease, stage 3 (moderate): Secondary | ICD-10-CM

## 2016-08-26 LAB — BASIC METABOLIC PANEL
Anion gap: 11 (ref 5–15)
BUN: 50 mg/dL — AB (ref 6–20)
CO2: 24 mmol/L (ref 22–32)
CREATININE: 2.72 mg/dL — AB (ref 0.44–1.00)
Calcium: 8.8 mg/dL — ABNORMAL LOW (ref 8.9–10.3)
Chloride: 95 mmol/L — ABNORMAL LOW (ref 101–111)
GFR calc Af Amer: 21 mL/min — ABNORMAL LOW (ref >60)
GFR calc non Af Amer: 18 mL/min — ABNORMAL LOW (ref >60)
GLUCOSE: 163 mg/dL — AB (ref 65–99)
Potassium: 4.1 mmol/L (ref 3.5–5.1)
SODIUM: 130 mmol/L — AB (ref 135–145)

## 2016-08-26 LAB — COOXEMETRY PANEL
Carboxyhemoglobin: 1.6 % — ABNORMAL HIGH (ref 0.5–1.5)
METHEMOGLOBIN: 1.2 % (ref 0.0–1.5)
O2 Saturation: 52.1 %
Total hemoglobin: 11.9 g/dL — ABNORMAL LOW (ref 12.0–16.0)

## 2016-08-26 MED ORDER — METOLAZONE 5 MG PO TABS
5.0000 mg | ORAL_TABLET | Freq: Every day | ORAL | Status: DC
  Administered 2016-08-26 – 2016-08-27 (×2): 5 mg via ORAL
  Filled 2016-08-26 (×2): qty 1

## 2016-08-26 NOTE — Progress Notes (Signed)
Patient had 24 beat V Tach. Asymptomatic. RN spoke with covering cardiology. BMP to be drawn in morning, no additional orders received.

## 2016-08-26 NOTE — Plan of Care (Signed)
Problem: Skin Integrity: Goal: Risk for impaired skin integrity will decrease Outcome: Progressing  Patient complains that if the wound gets dry it itches. Neck wound dressed with vaseline gauze, 4x4 and wrapped.  Problem: Activity: Goal: Risk for activity intolerance will decrease Outcome: Progressing Patient up to bedside commode, ambulates in room.   Problem: Fluid Volume: Goal: Ability to maintain a balanced intake and output will improve Outcome: Progressing I&O's monitored. Patient using bedside commode. CVP monitored Q4.

## 2016-08-26 NOTE — Progress Notes (Signed)
Advanced Heart Failure Rounding Note   Subjective:    Admitted with new onset atrial fltutter and ADHF.  Milrinone started 6/15 for worsening HF and renal failue  Today [birth year] birthday. Continues to feel better. Urine output improved. Breathing better. No further orthopnea. Creatinine up again slightly.   Remains in AFL but rate improved on IV amio.    Co-ox back down to 52%  CVP 9-11  Objective:   Weight Range:  Vital Signs:   Temp:  [97.7 F (36.5 C)-98.8 F (37.1 C)] 98.8 F (37.1 C) (06/17 0738) Pulse Rate:  [104-107] 105 (06/17 0738) Resp:  [17-33] 33 (06/17 0738) BP: (92-111)/(57-95) 92/69 (06/17 0738) SpO2:  [91 %-97 %] 96 % (06/17 0738) Weight:  [57 kg (125 lb 9.6 oz)] 57 kg (125 lb 9.6 oz) (06/17 0400) Last BM Date: 08/22/16  Weight change: Filed Weights   08/24/16 0506 08/25/16 0458 08/26/16 0400  Weight: 58.1 kg (128 lb) 56.7 kg (124 lb 14.4 oz) 57 kg (125 lb 9.6 oz)    Intake/Output:   Intake/Output Summary (Last 24 hours) at 08/26/16 1219 Last data filed at 08/26/16 1000  Gross per 24 hour  Intake          1309.71 ml  Output             1953 ml  Net          -643.29 ml     Physical Exam: General:  Lying in bed. NAD HEENT: normal anicteric. Poor dentition  Neck: supple. JVP 10.  Skin wound on back of net. Carotids 2+ bilat; no bruits. No lymphadenopathy or thryomegaly appreciated. Cor: PMI laterally displaced. RRR tachy +s3  Lungs: clear  Abdomen: soft , nontender, mildly distended. No hepatosplenomegaly. No bruits or masses. Good bowel sounds. Extremities: no cyanosis, clubbing, rash, 1+ edema Neuro: alert & oriented x 3, cranial nerves grossly intact. moves all 4 extremities w/o difficulty. Affect pleasant   Telemetry: A flutter 100-105 Personally reviewed    Labs: Basic Metabolic Panel:  Recent Labs Lab 08/22/16 1547 08/23/16 0415 08/24/16 0421 08/25/16 0351 08/26/16 0449  NA 135 132* 130* 129* 130*  K 3.9 4.4 4.9 4.4 4.1    CL 96* 97* 94* 94* 95*  CO2 30 23 25 24 24   GLUCOSE 167* 158* 173* 196* 163*  BUN 27* 36* 41* 46* 50*  CREATININE 1.81* 2.04* 2.25* 2.57* 2.72*  CALCIUM 9.4 9.0 9.1 8.7* 8.8*  MG  --   --   --  2.3  --     Liver Function Tests: No results for input(s): AST, ALT, ALKPHOS, BILITOT, PROT, ALBUMIN in the last 168 hours. No results for input(s): LIPASE, AMYLASE in the last 168 hours. No results for input(s): AMMONIA in the last 168 hours.  CBC:  Recent Labs Lab 08/22/16 1547  WBC 4.0  HGB 13.9  HCT 41.2  MCV 84.8  PLT 139*    Cardiac Enzymes: No results for input(s): CKTOTAL, CKMB, CKMBINDEX, TROPONINI in the last 168 hours.  BNP: BNP (last 3 results)  Recent Labs  09/28/15 1114  BNP 871.3*    ProBNP (last 3 results) No results for input(s): PROBNP in the last 8760 hours.    Other results:  Imaging: Dg Chest Port 1 View  Result Date: 08/24/2016 CLINICAL DATA:  Confirm PICC line EXAM: PORTABLE CHEST 1 VIEW COMPARISON:  None. FINDINGS: Stable cardiomegaly. Mild vascular congestion. PICC line tip from right-sided approach is noted at the cavoatrial junction.  Left-sided ICD device projects over the left shoulder with leads in the right ventricle. No acute nor suspicious osseous abnormality. IMPRESSION: 1. Right-sided PICC line is noted with tip at the cavoatrial junction. 2. Stable cardiomegaly with mild vascular congestion. Electronically Signed   By: Tollie Ethavid  Kwon M.D.   On: 08/24/2016 20:08     Medications:     Scheduled Medications: . apixaban  2.5 mg Oral BID  . aspirin EC  81 mg Oral Daily  . furosemide  80 mg Intravenous BID  . magnesium oxide  400 mg Oral Daily  . potassium chloride SA  40 mEq Oral BID  . sildenafil  20 mg Oral TID  . sodium chloride flush  10-40 mL Intracatheter Q12H  . sodium chloride flush  3 mL Intravenous Q12H  . sodium chloride flush  3 mL Intravenous Q12H    Infusions: . sodium chloride    . sodium chloride    . sodium  chloride    . sodium chloride    . amiodarone 30 mg/hr (08/26/16 1200)  . milrinone 0.25 mcg/kg/min (08/26/16 1200)    PRN Medications: sodium chloride, acetaminophen, albuterol, ondansetron (ZOFRAN) IV, sodium chloride flush, sodium chloride flush, sodium chloride flush   Assessment/Plan/Discussion:   Jessica [name] is a 56 year old with h/o chronic systolic heart failure, ICM, Biotronik ICD, and PAH admitted today with new onset A Fib/Flutter  1. A flutter RVR New Onset- June 11 with device interrogation.  - Remains in AFL. Rate improved on amio gtt.  - Will plan TEE/DC-CV tomorrow or Tuesday depending on Endoscopy schedule - Once stabilized can consider AFL ablation with Dr. Grant Fontanaaylo.rContinue amio and Eliquis 2. A/C Systolic Heart Failure- ICM. ECHO 10/2015 EF 20-25%. -> cardiogenic shock - echo 08/24/16. LVEF 10% severe RV HK (EF down further in setting of AFL) - milrinone started 6/15 for cardiogenic shock and cardiorenal syndrome  - feels much better. Co-ox dropping down to 52% CVP 9-11 - Continue milrinone. Continue IV diuresis. Add metolaozne. Watch renal function closely. - No bb or ACE/ARB/ARNI with low output and AKI .  3. 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 Hopefully will improve with inotropic support.  - Watch creatinine closely 4. PAH - continue sildenafil 20 mg three times a day.  5. CAD S/P multiple interventions.  - No s/s of ischemia - Continues asa.  No nitro with sildenafil.  6. PAD  - occluded left SFA with mild/mod ABi. No revascularization was needed October 2017. 7. H/O LV Thrombus - not seen on current echo (reviewed personally) 8. Hyponatremia -improving  Happy [place] OuchBirthday!  Length of Stay: 4  Arvilla MeresBensimhon, [place] Vierling MD 08/26/2016, 12:19 PM  Advanced Heart Failure Team Pager [phone] (M-F; 7a - 4p)  Please contact [facility] for night-coverage after hours (4p  -7a ) and weekends on [email or web address]  .

## 2016-08-27 ENCOUNTER — Ambulatory Visit (HOSPITAL_COMMUNITY): Payer: Medicare Other | Admitting: Nurse Practitioner

## 2016-08-27 ENCOUNTER — Encounter (HOSPITAL_COMMUNITY): Payer: Self-pay | Admitting: Critical Care Medicine

## 2016-08-27 ENCOUNTER — Inpatient Hospital Stay (HOSPITAL_COMMUNITY): Payer: Medicare Other | Admitting: Critical Care Medicine

## 2016-08-27 ENCOUNTER — Inpatient Hospital Stay (HOSPITAL_COMMUNITY): Payer: Medicare Other

## 2016-08-27 ENCOUNTER — Encounter (HOSPITAL_COMMUNITY)
Admission: EM | Disposition: A | Discharge status: Home Health | Payer: Self-pay | Source: Home / Self Care | Attending: Internal Medicine

## 2016-08-27 DIAGNOSIS — I4891 Unspecified atrial fibrillation: Secondary | ICD-10-CM

## 2016-08-27 HISTORY — PX: CARDIOVERSION: SHX1299

## 2016-08-27 HISTORY — PX: TEE WITHOUT CARDIOVERSION: SHX5443

## 2016-08-27 LAB — BASIC METABOLIC PANEL
Anion gap: 10 (ref 5–15)
BUN: 55 mg/dL — ABNORMAL HIGH (ref 6–20)
CALCIUM: 8.8 mg/dL — AB (ref 8.9–10.3)
CO2: 24 mmol/L (ref 22–32)
Chloride: 94 mmol/L — ABNORMAL LOW (ref 101–111)
Creatinine, Ser: 2.98 mg/dL — ABNORMAL HIGH (ref 0.44–1.00)
GFR calc non Af Amer: 16 mL/min — ABNORMAL LOW (ref >60)
GFR, EST AFRICAN AMERICAN: 19 mL/min — AB (ref >60)
Glucose, Bld: 191 mg/dL — ABNORMAL HIGH (ref 65–99)
Potassium: 4.1 mmol/L (ref 3.5–5.1)
SODIUM: 128 mmol/L — AB (ref 135–145)

## 2016-08-27 LAB — CBC
HCT: 35.1 % — ABNORMAL LOW (ref 36.0–46.0)
Hemoglobin: 11.9 g/dL — ABNORMAL LOW (ref 12.0–15.0)
MCH: 27.6 pg (ref 26.0–34.0)
MCHC: 33.9 g/dL (ref 30.0–36.0)
MCV: 81.4 fL (ref 78.0–100.0)
PLATELETS: 100 10*3/uL — AB (ref 150–400)
RBC: 4.31 MIL/uL (ref 3.87–5.11)
RDW: 16.4 % — AB (ref 11.5–15.5)
WBC: 4.1 10*3/uL (ref 4.0–10.5)

## 2016-08-27 LAB — GLUCOSE, CAPILLARY
GLUCOSE-CAPILLARY: 187 mg/dL — AB (ref 65–99)
GLUCOSE-CAPILLARY: 271 mg/dL — AB (ref 65–99)
Glucose-Capillary: 209 mg/dL — ABNORMAL HIGH (ref 65–99)

## 2016-08-27 LAB — COOXEMETRY PANEL
Carboxyhemoglobin: 1.6 % — ABNORMAL HIGH (ref 0.5–1.5)
METHEMOGLOBIN: 1.2 % (ref 0.0–1.5)
O2 SAT: 55 %
Total hemoglobin: 6.7 g/dL — CL (ref 12.0–16.0)

## 2016-08-27 SURGERY — CARDIOVERSION
Anesthesia: Monitor Anesthesia Care

## 2016-08-27 MED ORDER — SODIUM CHLORIDE 0.9 % IV SOLN
250.0000 mL | INTRAVENOUS | Status: DC
  Administered 2016-08-27: 250 mL via INTRAVENOUS

## 2016-08-27 MED ORDER — INSULIN ASPART 100 UNIT/ML ~~LOC~~ SOLN
0.0000 [IU] | SUBCUTANEOUS | Status: DC
  Administered 2016-08-27: 2 [IU] via SUBCUTANEOUS
  Administered 2016-08-27: 3 [IU] via SUBCUTANEOUS
  Administered 2016-08-27: 5 [IU] via SUBCUTANEOUS
  Administered 2016-08-28 (×3): 2 [IU] via SUBCUTANEOUS
  Administered 2016-08-28: 5 [IU] via SUBCUTANEOUS
  Administered 2016-08-28: 3 [IU] via SUBCUTANEOUS
  Administered 2016-08-28 – 2016-08-29 (×3): 2 [IU] via SUBCUTANEOUS
  Administered 2016-08-29: 1 [IU] via SUBCUTANEOUS
  Administered 2016-08-29: 2 [IU] via SUBCUTANEOUS
  Administered 2016-08-29: 3 [IU] via SUBCUTANEOUS
  Administered 2016-08-30: 1 [IU] via SUBCUTANEOUS
  Administered 2016-08-30: 7 [IU] via SUBCUTANEOUS
  Administered 2016-08-30: 2 [IU] via SUBCUTANEOUS
  Administered 2016-08-30: 3 [IU] via SUBCUTANEOUS
  Administered 2016-08-31: 2 [IU] via SUBCUTANEOUS

## 2016-08-27 MED ORDER — PRAVASTATIN SODIUM 20 MG PO TABS
20.0000 mg | ORAL_TABLET | Freq: Every day | ORAL | Status: DC
  Administered 2016-08-27 – 2016-09-30 (×35): 20 mg via ORAL
  Filled 2016-08-27 (×35): qty 1

## 2016-08-27 MED ORDER — MILRINONE LACTATE IN DEXTROSE 20-5 MG/100ML-% IV SOLN
0.3750 ug/kg/min | INTRAVENOUS | Status: DC
  Administered 2016-08-27: 0.375 ug/kg/min via INTRAVENOUS
  Administered 2016-08-28: 0.5 ug/kg/min via INTRAVENOUS
  Administered 2016-08-29 – 2016-09-17 (×29): 0.375 ug/kg/min via INTRAVENOUS
  Filled 2016-08-27 (×32): qty 100

## 2016-08-27 MED ORDER — PROPOFOL 10 MG/ML IV BOLUS
INTRAVENOUS | Status: DC | PRN
  Administered 2016-08-27 (×3): 10 mg via INTRAVENOUS

## 2016-08-27 MED ORDER — GUAIFENESIN-DM 100-10 MG/5ML PO SYRP
15.0000 mL | ORAL_SOLUTION | ORAL | Status: DC | PRN
  Administered 2016-08-27 – 2016-08-29 (×4): 15 mL via ORAL
  Filled 2016-08-27 (×4): qty 15

## 2016-08-27 MED ORDER — SODIUM CHLORIDE 0.9% FLUSH: 3.0000 mL | INTRAVENOUS | Status: DC | PRN

## 2016-08-27 MED ORDER — SODIUM CHLORIDE 0.9% FLUSH: 3.0000 mL | Freq: Two times a day (BID) | INTRAVENOUS | Status: DC

## 2016-08-27 MED ORDER — PROPOFOL 500 MG/50ML IV EMUL
INTRAVENOUS | Status: DC | PRN
  Administered 2016-08-27: 50 ug/kg/min via INTRAVENOUS

## 2016-08-27 NOTE — Plan of Care (Signed)
Problem: Pain Managment: Goal: General experience of comfort will improve Outcome: Progressing Patients discomfort stemmed from cough. PRN Robitussin DM ordered. Patient stated relief from cough medication.   Problem: Skin Integrity: Goal: Risk for impaired skin integrity will decrease Outcome: Progressing Patient requesting back of neck wound to remain open to air this shift due to discomfort from the dressing she has tried. Left chest incision remains covered.   Problem: Fluid Volume: Goal: Ability to maintain a balanced intake and output will improve Outcome: Progressing Minimal dried blood to PICC site. No oozing noted this shift.   Problem: Bowel/Gastric: Goal: Will not experience complications related to bowel motility No bloody stools this shift. When CBC / CoOx returned, patient asked if she's noted any blood or dark stools since she's been admitted. Patient stated she had a dark stool two days ago. RN advised patient to leave next stool in collections basin. Labs redrawn.

## 2016-08-27 NOTE — Transfer of Care (Signed)
Immediate Anesthesia Transfer of Care Note  Patient: Jessica [name]  Procedure(s) Performed: Procedure(s): CARDIOVERSION (N/A) TRANSESOPHAGEAL ECHOCARDIOGRAM (TEE) (N/A)  Patient Location: Endoscopy Unit  Anesthesia Type:MAC  Level of Consciousness: awake and alert   Airway & Oxygen Therapy: Patient Spontanous Breathing and Patient connected to nasal cannula oxygen  Post-op Assessment: Report given to RN and Post -op Vital signs reviewed and stable  Post vital signs: Reviewed and stable  Last Vitals:  Vitals:   08/27/16 1028 08/27/16 1318  BP: 108/66 108/69  Pulse: 100 (!) 102  Resp: 20 (!) 102  Temp: 36.4 C 37.1 C   BP 82/39 Last Pain:  Vitals:   08/27/16 1318  TempSrc: Oral  PainSc:          Complications: No apparent anesthesia complications

## 2016-08-27 NOTE — Anesthesia Preprocedure Evaluation (Addendum)
Anesthesia Evaluation   Patient awake    Reviewed: Allergy & Precautions, NPO status , Patient's Chart, lab work & pertinent test results  History of Anesthesia Complications Negative for: history of anesthetic complications  Airway Mallampati: II  TM Distance: >3 FB Neck ROM: Full    Dental no notable dental hx. (+) Dental Advisory Given   Pulmonary sleep apnea , COPD, Current Smoker,    Pulmonary exam normal        Cardiovascular + CAD, + Past MI, + Cardiac Stents, + Peripheral Vascular Disease and +CHF  + Cardiac Defibrillator  Rhythm:Irregular Rate:Normal  Study Conclusions  - Left ventricle: The cavity size was severely dilated. Wall   thickness was normal. The estimated ejection fraction was 15%.   Diffuse hypokinesis.   Neuro/Psych PSYCHIATRIC DISORDERS Anxiety negative neurological ROS     GI/Hepatic GERD  ,(+)     substance abuse  alcohol use,   Endo/Other  diabetes  Renal/GU      Musculoskeletal   Abdominal   Peds  Hematology   Anesthesia Other Findings   Reproductive/Obstetrics                            Anesthesia Physical Anesthesia Plan  ASA: IV  Anesthesia Plan: MAC   Post-op Pain Management:    Induction:   PONV Risk Score and Plan:   Airway Management Planned: Natural Airway and Simple Face Mask  Additional Equipment:   Intra-op Plan:   Post-operative Plan:   Informed Consent: I have reviewed the patients History and Physical, chart, labs and discussed the procedure including the risks, benefits and alternatives for the proposed anesthesia with the patient or authorized representative who has indicated his/her understanding and acceptance.   Dental advisory given  Plan Discussed with: CRNA and Anesthesiologist  Anesthesia Plan Comments:         Anesthesia Quick Evaluation

## 2016-08-27 NOTE — Anesthesia Postprocedure Evaluation (Signed)
Anesthesia Post Note  Patient: Jessica [name]  Procedure(s) Performed: Procedure(s) (LRB): CARDIOVERSION (N/A) TRANSESOPHAGEAL ECHOCARDIOGRAM (TEE) (N/A)     Patient location during evaluation: Endoscopy Anesthesia Type: MAC Level of consciousness: awake and alert Pain management: pain level controlled Vital Signs Assessment: post-procedure vital signs reviewed and stable Respiratory status: spontaneous breathing and respiratory function stable Cardiovascular status: stable Anesthetic complications: no    Last Vitals:  Vitals:   08/27/16 1502 08/27/16 1651  BP: (!) 87/56   Pulse: (!) 59   Resp: 15   Temp:  36.4 C    Last Pain:  Vitals:   08/27/16 1651  TempSrc: Oral  PainSc:                  [name]

## 2016-08-27 NOTE — Care Management Important Message (Signed)
Important Message  Patient Details  Name: Jessica [name] MRN: [id number] Date of Birth: 1961/03/06   Medicare Important Message Given:  Yes    Jessica [name], Jessica [name] Abena 08/27/2016, 1:37 PM

## 2016-08-27 NOTE — Progress Notes (Signed)
  Echocardiogram Echocardiogram Transesophageal has been performed.  Leta JunglingCooper, [name] M 08/27/2016, 2:29 PM

## 2016-08-27 NOTE — CV Procedure (Signed)
   TRANSESOPHAGEAL ECHOCARDIOGRAM GUIDED DIRECT CURRENT CARDIOVERSION  NAME:  Jessica [name]   MRN: [id number] DOB:  1961-02-12   ADMIT DATE: 08/22/2016  INDICATIONS: Atrial flutter  PROCEDURE:   Informed consent was obtained prior to the procedure. The risks, benefits and alternatives for the procedure were discussed and the patient comprehended these risks.  Risks include, but are not limited to, cough, sore throat, vomiting, nausea, somnolence, esophageal and stomach trauma or perforation, bleeding, low blood pressure, aspiration, pneumonia, infection, trauma to the teeth and death.    After a procedural time-out, the oropharynx was anesthetized and the patient was sedated by the anesthesia service. The transesophageal probe was inserted in the esophagus and stomach without difficulty and multiple views were obtained.   FINDINGS:  LEFT VENTRICLE: EF = 15% Diffuse global HK  RIGHT VENTRICLE: Dilated. Severely HK  LEFT ATRIUM: Moderately dilated 4.6 cm  LEFT ATRIAL APPENDAGE: No clot  RIGHT ATRIUM: Dilated. + pacer wire  AORTIC VALVE:  Trileaflet. No AS/AI  MITRAL VALVE:  Normal Trivial MR  TRICUSPID VALVE: Normal. Moderate TR  PULMONIC VALVE: Grossly normal. No significant PI  INTERATRIAL SEPTUM: No ASD or PFO  PERICARDIUM: Small effusion  DESCENDING AORTA: Not visualized   CARDIOVERSION:     Indications:  Atrial Flutter  Procedure Details:  Once the TEE was complete, the patient had the defibrillator pads placed in the anterior and posterior position. Once an appropriate level of sedation was achieved, the patient received a single biphasic, synchronized 150J shock with prompt conversion to sinus rhythm. No apparent complications. Rhythm confirmed by ICD analysis at bedside.    Arvilla MeresBensimhon, Jessica Peine, MD  2:10 PM

## 2016-08-27 NOTE — Progress Notes (Signed)
[id number]: Lab called with platelet drop to 54 from 139 (6/13) inquiring about lab redraw. Hgb from CBC 7.1. MD paged with results, awaiting return call.   0515: Lab called with critical BMP results. Potassium 2.2 ( 4.1  6/17) and Calcium 5.0 ( 8.8  6/17). Cardiology returned call, order received for redraw.

## 2016-08-27 NOTE — Progress Notes (Signed)
Critical Result lab value called to cardiology covering. CoOx Hgb of 6.7 (6/17 11.9). No signs of bleeding. PICC line dressing intact, no oozing. CBC drawn along with CoOx this morning. Will await results of CBC.

## 2016-08-27 NOTE — Progress Notes (Signed)
Patient current BP 89/55, HR 69 , called and spoke with Lisabeth DevoidMeng , NP about ordered 80 MG IV lasix, was told to hold for BP less then 95 systolic. Patient also had a 7 beat of V-tach while sleeping. No other orders at this time. Will continue to monitor.

## 2016-08-27 NOTE — Progress Notes (Signed)
Advanced Heart Failure Rounding Note   Subjective:    Admitted with new onset atrial fltutter and ADHF.  Milrinone started 6/15 for worsening HF and renal failue  Yesterday she continued on milrinone and diuresed with IV lasix + metolazone. Also continued on amio drip for persistent A flutter.    Complaining of cough and fatigue. Denies CP.  CVP 14 Co-ox 55%  Creatinine up to 2.98      Objective:   Weight Range:  Vital Signs:   Temp:  [98.2 F (36.8 C)-98.8 F (37.1 C)] 98.2 F (36.8 C) (06/18 0420) Pulse Rate:  [97-103] 103 (06/18 0420) Resp:  [17-34] 17 (06/18 0420) BP: (88-99)/(57-69) 98/69 (06/18 0420) SpO2:  [93 %-98 %] 98 % (06/18 0420) Weight:  [127 lb 11.2 oz (57.9 kg)] 127 lb 11.2 oz (57.9 kg) (06/18 0420) Last BM Date: 08/22/16  Weight change: Filed Weights   08/25/16 0458 08/26/16 0400 08/27/16 0420  Weight: 124 lb 14.4 oz (56.7 kg) 125 lb 9.6 oz (57 kg) 127 lb 11.2 oz (57.9 kg)    Intake/Output:   Intake/Output Summary (Last 24 hours) at 08/27/16 0748 Last data filed at 08/27/16 0600  Gross per 24 hour  Intake           1444.1 ml  Output             1450 ml  Net             -5.9 ml     Physical Exam: CVP ~15 General:  Chronically ill  appearing. No resp difficulty. NAD. Sitting on the side of the bed.  HEENT: normal Neck: supple. JVP to jaw. Carotids 2+ bilat; no bruits. No lymphadenopathy or thryomegaly appreciated. Cor: PMI nondisplaced. Regular rate & rhythm Tachy . No rubs,  or murmurs. + S3  Lungs: RLL crackles  Abdomen: soft, nontender, nondistended. No hepatosplenomegaly. No bruits or masses. Good bowel sounds. Extremities: no cyanosis, clubbing, rash, R and LLE trace edema Neuro: alert & orientedx3, cranial nerves grossly intact. moves all 4 extremities w/o difficulty. Affect pleasant Skin: multiple scars on trunk and neck. Small area neck with new scar/scab noted. No dressing.   Telemetry: A Flutter 100s. Personally reviewed.     Labs: Basic Metabolic Panel:  Recent Labs Lab 08/24/16 0421 08/25/16 0351 08/26/16 0449 08/27/16 0428 08/27/16 0539  NA 130* 129* 130* 140 128*  K 4.9 4.4 4.1 2.2* 4.1  CL 94* 94* 95* 118* 94*  CO2 25 24 24  17* 24  GLUCOSE 173* 196* 163* 126* 191*  BUN 41* 46* 50* 33* 55*  CREATININE 2.25* 2.57* 2.72* 1.64* 2.98*  CALCIUM 9.1 8.7* 8.8* 5.0* 8.8*  MG  --  2.3  --   --   --     Liver Function Tests: No results for input(s): AST, ALT, ALKPHOS, BILITOT, PROT, ALBUMIN in the last 168 hours. No results for input(s): LIPASE, AMYLASE in the last 168 hours. No results for input(s): AMMONIA in the last 168 hours.  CBC:  Recent Labs Lab 08/22/16 1547 08/27/16 0539  WBC 4.0 4.1  HGB 13.9 11.9*  HCT 41.2 35.1*  MCV 84.8 81.4  PLT 139* PENDING    Cardiac Enzymes: No results for input(s): CKTOTAL, CKMB, CKMBINDEX, TROPONINI in the last 168 hours.  BNP: BNP (last 3 results)  Recent Labs  09/28/15 1114  BNP 871.3*    ProBNP (last 3 results) No results for input(s): PROBNP in the last 8760 hours.    Other  results:  Imaging: No results found.   Medications:     Scheduled Medications: . apixaban  2.5 mg Oral BID  . aspirin EC  81 mg Oral Daily  . furosemide  80 mg Intravenous BID  . magnesium oxide  400 mg Oral Daily  . metolazone  5 mg Oral Daily  . potassium chloride SA  40 mEq Oral BID  . sildenafil  20 mg Oral TID  . sodium chloride flush  10-40 mL Intracatheter Q12H  . sodium chloride flush  3 mL Intravenous Q12H  . sodium chloride flush  3 mL Intravenous Q12H    Infusions: . sodium chloride    . sodium chloride    . sodium chloride    . sodium chloride    . amiodarone 30 mg/hr (08/27/16 0456)  . milrinone 0.25 mcg/kg/min (08/27/16 0456)    PRN Medications: sodium chloride, acetaminophen, albuterol, guaiFENesin-dextromethorphan, ondansetron (ZOFRAN) IV, sodium chloride flush, sodium chloride flush, sodium chloride  flush   Assessment/Plan/Discussion:   Quanisha is a 56 year old with h/o chronic systolic heart failure, ICM, Biotronik ICD, and PAH admitted today with new onset A Fib/Flutter  1. A flutter RVR New Onset- June 11 with device interrogation.  -Remains in AFL. Continue amio drip. [name]  - Try to get TEE/DC-CV today.  - Dr Gala Romney will discuss with Dr Ladona Ridgel. Continue amio and eliquis.  2. A/C Systolic Heart Failure- ICM. ECHO 10/2015 EF 20-25%. -> cardiogenic shock - echo 08/24/16. LVEF 10% severe RV HK (EF down further in setting of AFL) - milrinone started 6/15 for cardiogenic shock and cardiorenal syndrome  Not a candidate for LVAD with RV failure.  - Todays CO-OX 55%.  -CVP rising.  Continue milrinone. Continue IV lasix + metolazone. Hopefully we can restore NSR and improve diuresis.   - No bb or ACE/ARB/ARNI with low output and AKI .  3. 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 Hopefully will improve with inotropic support.  4. PAH - continue sildenafil 20 mg three times a day.  5. CAD S/P multiple interventions.  - No S/S ischemia. Continue asa.  - Continues asa.  No nitro with sildenafil.  She was unable to take simvastatin due to myalgias. Add pravastatin. She is will to try.  6. PAD  - occluded left SFA with mild/mod ABi. No revascularization was needed October 2017. 7. H/O LV Thrombus - not seen on current echo  8. Hyponatremia -sodium 128.  Restrict free water.   BMET [phone] was wrong. Redrawn at 0539 was consistent with previous lab results. Lab was called.   She is   Length of Stay: 5  Amy Clegg NP-C  08/27/2016, 7:48 AM  Advanced Heart Failure Team Pager [phone] (M-F; 7a - 4p)  Please contact [facility] for night-coverage after hours (4p -7a ) and weekends on [email or web address]  Patient seen and examined with [name], NP. We discussed all aspects of the encounter. I  agree with the assessment and plan as stated above.   She remains quite tenuous. CVP elevated. Co-ox marginal despite milrinone support. Remains in AFL.   Will need TEE and DCCV as soon as possible. We checked with Endoscopy and no spots today. Will attempt to get her a spot in early am tomorrow. Continue Eliquis. Increase milrinone to 0.375  Mylin Hirano, MD  10:14 AM     .

## 2016-08-27 NOTE — H&P (View-Only) (Signed)
Advanced Heart Failure Rounding Note   Subjective:    Admitted with new onset atrial fltutter and ADHF.  Milrinone started 6/15 for worsening HF and renal failue  Yesterday she continued on milrinone and diuresed with IV lasix + metolazone. Also continued on amio drip for persistent A flutter.    Complaining of cough and fatigue. Denies CP.  CVP 14 Co-ox 55%  Creatinine up to 2.98      Objective:   Weight Range:  Vital Signs:   Temp:  [98.2 F (36.8 C)-98.8 F (37.1 C)] 98.2 F (36.8 C) (06/18 0420) Pulse Rate:  [97-103] 103 (06/18 0420) Resp:  [17-34] 17 (06/18 0420) BP: (88-99)/(57-69) 98/69 (06/18 0420) SpO2:  [93 %-98 %] 98 % (06/18 0420) Weight:  [127 lb 11.2 oz (57.9 kg)] 127 lb 11.2 oz (57.9 kg) (06/18 0420) Last BM Date: 08/22/16  Weight change: Filed Weights   08/25/16 0458 08/26/16 0400 08/27/16 0420  Weight: 124 lb 14.4 oz (56.7 kg) 125 lb 9.6 oz (57 kg) 127 lb 11.2 oz (57.9 kg)    Intake/Output:   Intake/Output Summary (Last 24 hours) at 08/27/16 0748 Last data filed at 08/27/16 0600  Gross per 24 hour  Intake           1444.1 ml  Output             1450 ml  Net             -5.9 ml     Physical Exam: CVP ~15 General:  Chronically ill  appearing. No resp difficulty. NAD. Sitting on the side of the bed.  HEENT: normal Neck: supple. JVP to jaw. Carotids 2+ bilat; no bruits. No lymphadenopathy or thryomegaly appreciated. Cor: PMI nondisplaced. Regular rate & rhythm Tachy . No rubs,  or murmurs. + S3  Lungs: RLL crackles  Abdomen: soft, nontender, nondistended. No hepatosplenomegaly. No bruits or masses. Good bowel sounds. Extremities: no cyanosis, clubbing, rash, R and LLE trace edema Neuro: alert & orientedx3, cranial nerves grossly intact. moves all 4 extremities w/o difficulty. Affect pleasant Skin: multiple scars on trunk and neck. Small area neck with new scar/scab noted. No dressing.   Telemetry: A Flutter 100s. Personally reviewed.     Labs: Basic Metabolic Panel:  Recent Labs Lab 08/24/16 0421 08/25/16 0351 08/26/16 0449 08/27/16 0428 08/27/16 0539  NA 130* 129* 130* 140 128*  K 4.9 4.4 4.1 2.2* 4.1  CL 94* 94* 95* 118* 94*  CO2 25 24 24  17* 24  GLUCOSE 173* 196* 163* 126* 191*  BUN 41* 46* 50* 33* 55*  CREATININE 2.25* 2.57* 2.72* 1.64* 2.98*  CALCIUM 9.1 8.7* 8.8* 5.0* 8.8*  MG  --  2.3  --   --   --     Liver Function Tests: No results for input(s): AST, ALT, ALKPHOS, BILITOT, PROT, ALBUMIN in the last 168 hours. No results for input(s): LIPASE, AMYLASE in the last 168 hours. No results for input(s): AMMONIA in the last 168 hours.  CBC:  Recent Labs Lab 08/22/16 1547 08/27/16 0539  WBC 4.0 4.1  HGB 13.9 11.9*  HCT 41.2 35.1*  MCV 84.8 81.4  PLT 139* PENDING    Cardiac Enzymes: No results for input(s): CKTOTAL, CKMB, CKMBINDEX, TROPONINI in the last 168 hours.  BNP: BNP (last 3 results)  Recent Labs  09/28/15 1114  BNP 871.3*    ProBNP (last 3 results) No results for input(s): PROBNP in the last 8760 hours.    Other  results:  Imaging: No results found.   Medications:     Scheduled Medications: . apixaban  2.5 mg Oral BID  . aspirin EC  81 mg Oral Daily  . furosemide  80 mg Intravenous BID  . magnesium oxide  400 mg Oral Daily  . metolazone  5 mg Oral Daily  . potassium chloride SA  40 mEq Oral BID  . sildenafil  20 mg Oral TID  . sodium chloride flush  10-40 mL Intracatheter Q12H  . sodium chloride flush  3 mL Intravenous Q12H  . sodium chloride flush  3 mL Intravenous Q12H    Infusions: . sodium chloride    . sodium chloride    . sodium chloride    . sodium chloride    . amiodarone 30 mg/hr (08/27/16 0456)  . milrinone 0.25 mcg/kg/min (08/27/16 0456)    PRN Medications: sodium chloride, acetaminophen, albuterol, guaiFENesin-dextromethorphan, ondansetron (ZOFRAN) IV, sodium chloride flush, sodium chloride flush, sodium chloride  flush   Assessment/Plan/Discussion:   Jessica [name] is a 56 year old with h/o chronic systolic heart failure, ICM, Biotronik ICD, and PAH admitted today with new onset A Fib/Flutter  1. A flutter RVR New Onset- June 11 with device interrogation.  -Remains in AFL. Continue amio drip. [name]  - Try to get TEE/DC-CV today.  - Dr Gala Romney will discuss with Dr Ladona Ridgel. Continue amio and eliquis.  2. A/C Systolic Heart Failure- ICM. ECHO 10/2015 EF 20-25%. -> cardiogenic shock - echo 08/24/16. LVEF 10% severe RV HK (EF down further in setting of AFL) - milrinone started 6/15 for cardiogenic shock and cardiorenal syndrome  Not a candidate for LVAD with RV failure.  - Todays CO-OX 55%.  -CVP rising.  Continue milrinone. Continue IV lasix + metolazone. Hopefully we can restore NSR and improve diuresis.   - No bb or ACE/ARB/ARNI with low output and AKI .  3. 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 Hopefully will improve with inotropic support.  4. PAH - continue sildenafil 20 mg three times a day.  5. CAD S/P multiple interventions.  - No S/S ischemia. Continue asa.  - Continues asa.  No nitro with sildenafil.  She was unable to take simvastatin due to myalgias. Add pravastatin. She is will to try.  6. PAD  - occluded left SFA with mild/mod ABi. No revascularization was needed October 2017. 7. H/O LV Thrombus - not seen on current echo  8. Hyponatremia -sodium 128.  Restrict free water.   BMET [phone] was wrong. Redrawn at 0539 was consistent with previous lab results. Lab was called.   She is   Length of Stay: 5  Amy Clegg NP-C  08/27/2016, 7:48 AM  Advanced Heart Failure Team Pager [phone] (M-F; 7a - 4p)  Please contact [facility] for night-coverage after hours (4p -7a ) and weekends on [email or web address]  Patient seen and examined with [name], NP. We discussed all aspects of the encounter. I  agree with the assessment and plan as stated above.   She remains quite tenuous. CVP elevated. Co-ox marginal despite milrinone support. Remains in AFL.   Will need TEE and DCCV as soon as possible. We checked with Endoscopy and no spots today. Will attempt to get her a spot in early am tomorrow. Continue Eliquis. Increase milrinone to 0.375  Harjot Dibello, MD  10:14 AM     .

## 2016-08-27 NOTE — Interval H&P Note (Signed)
History and Physical Interval Note:  08/27/2016 12:58 PM  Jessica [name]  has presented today for surgery, with the diagnosis of afib  The various methods of treatment have been discussed with the patient and family. After consideration of risks, benefits and other options for treatment, the patient has consented to  Procedure(s): CARDIOVERSION (N/A) TRANSESOPHAGEAL ECHOCARDIOGRAM (TEE) (N/A) as a surgical intervention .  The patient's history has been reviewed, patient examined, no change in status, stable for surgery.  I have reviewed the patient's chart and labs.  Questions were answered to the patient's satisfaction.     Bensimhon, Reuel Boomaniel

## 2016-08-28 LAB — GLUCOSE, CAPILLARY
GLUCOSE-CAPILLARY: 153 mg/dL — AB (ref 65–99)
GLUCOSE-CAPILLARY: 162 mg/dL — AB (ref 65–99)
GLUCOSE-CAPILLARY: 171 mg/dL — AB (ref 65–99)
Glucose-Capillary: 160 mg/dL — ABNORMAL HIGH (ref 65–99)
Glucose-Capillary: 256 mg/dL — ABNORMAL HIGH (ref 65–99)
Glucose-Capillary: 246 mg/dL — ABNORMAL HIGH (ref 65–99)
Glucose-Capillary: 189 mg/dL — ABNORMAL HIGH (ref 65–99)

## 2016-08-28 LAB — BASIC METABOLIC PANEL
ANION GAP: 12 (ref 5–15)
BUN: 64 mg/dL — ABNORMAL HIGH (ref 6–20)
CALCIUM: 8.6 mg/dL — AB (ref 8.9–10.3)
CO2: 23 mmol/L (ref 22–32)
Chloride: 96 mmol/L — ABNORMAL LOW (ref 101–111)
Creatinine, Ser: 3.48 mg/dL — ABNORMAL HIGH (ref 0.44–1.00)
GFR calc non Af Amer: 13 mL/min — ABNORMAL LOW (ref >60)
GFR, EST AFRICAN AMERICAN: 16 mL/min — AB (ref >60)
Glucose, Bld: 153 mg/dL — ABNORMAL HIGH (ref 65–99)
Potassium: 4.9 mmol/L (ref 3.5–5.1)
Sodium: 131 mmol/L — ABNORMAL LOW (ref 135–145)

## 2016-08-28 LAB — COOXEMETRY PANEL
CARBOXYHEMOGLOBIN: 1.7 % — AB (ref 0.5–1.5)
CARBOXYHEMOGLOBIN: 1.2 % (ref 0.5–1.5)
Carboxyhemoglobin: 1.3 % (ref 0.5–1.5)
Methemoglobin: 0.9 % (ref 0.0–1.5)
Methemoglobin: 1.2 % (ref 0.0–1.5)
Methemoglobin: 1.1 % (ref 0.0–1.5)
O2 SAT: 47.1 %
O2 SAT: 49.3 %
O2 Saturation: 48.6 %
Total hemoglobin: 12.1 g/dL (ref 12.0–16.0)
Total hemoglobin: 10.8 g/dL — ABNORMAL LOW (ref 12.0–16.0)
Total hemoglobin: 12.2 g/dL (ref 12.0–16.0)

## 2016-08-28 LAB — MAGNESIUM: Magnesium: 2.5 mg/dL — ABNORMAL HIGH (ref 1.7–2.4)

## 2016-08-28 MED ORDER — NOREPINEPHRINE BITARTRATE 1 MG/ML IV SOLN
0.0000 ug/min | INTRAVENOUS | Status: DC
  Administered 2016-08-28: 2 ug/min via INTRAVENOUS
  Filled 2016-08-28: qty 4

## 2016-08-28 MED ORDER — NOREPINEPHRINE BITARTRATE 1 MG/ML IV SOLN
2.0000 ug/min | INTRAVENOUS | Status: DC
  Administered 2016-08-29 (×2): 5 ug/min via INTRAVENOUS
  Administered 2016-09-03: 3 ug/min via INTRAVENOUS
  Administered 2016-09-07: 5 ug/min via INTRAVENOUS
  Administered 2016-09-09 – 2016-09-16 (×5): 8 ug/min via INTRAVENOUS
  Administered 2016-09-17: 14 ug/min via INTRAVENOUS
  Administered 2016-09-18: 16 ug/min via INTRAVENOUS
  Administered 2016-09-19: 17 ug/min via INTRAVENOUS
  Administered 2016-09-20: 16 ug/min via INTRAVENOUS
  Administered 2016-09-20: 17 ug/min via INTRAVENOUS
  Filled 2016-08-28 (×20): qty 16

## 2016-08-28 NOTE — Plan of Care (Signed)
Problem: Skin Integrity: Goal: Risk for impaired skin integrity will decrease Outcome: Not Progressing Patients chest wall site noted to have purulent drainage. Site cleaned and redressed. Patient allowed RN to dress neck site which has noted improvement.   Problem: Tissue Perfusion: Goal: Risk factors for ineffective tissue perfusion will decrease Outcome: Progressing Patient remained in SR throughout the evening. One instance of V Tach which is consistent with the past 3 nights. Patient remained asymptomatic. BMP and Mag drawn this morning. K 4.9, Mag 2.5. Amiodarone and Milrinone continue to run per eMAR.   Problem: Fluid Volume: Goal: Ability to maintain a balanced intake and output will improve Outcome: Progressing I&O's monitored. Hat in toilet, instances where patient's had BM and was unable to measure.

## 2016-08-28 NOTE — Progress Notes (Signed)
eLink Physician-Brief Progress Note Patient Name: Jessica MoccasinSherita [name] DOB: November 15, 1960 MRN: [id number]   Date of Service  08/28/2016  HPI/Events of Note  New Patient Elink Evaluation.  Pt admitted with A flutter and CHF exacerbation.   eICU Interventions  No additional elink interventions at this time.        Deneice Wack 08/28/2016, 4:27 PM

## 2016-08-28 NOTE — Progress Notes (Signed)
Advanced Heart Failure Rounding Note   Subjective:    Admitted with new onset atrial fltutter and ADHF.  Milrinone started 6/15 for worsening HF and renal failue  Yesterday milrinone was increased to 0.375 mcg and she continued to diurese with IV lasix. Creatinine climbing. 2.98>3.48 . Also had TEE and successful DC-CV.   Complaining of cough. SOB with exertion.   Objective:   Weight Range:  Vital Signs:   Temp:  [97.5 F (36.4 C)-98.2 F (36.8 C)] 97.9 F (36.6 C) (06/18 2001) Pulse Rate:  [55-102] 64 (06/18 2300) Resp:  [15-102] 20 (06/19 0400) BP: (73-108)/(28-74) 100/57 (06/19 0400) SpO2:  [94 %-100 %] 96 % (06/18 2300) Weight:  [131 lb 8 oz (59.6 kg)] 131 lb 8 oz (59.6 kg) (06/19 0300) Last BM Date: 08/27/16  Weight change: Filed Weights   08/26/16 0400 08/27/16 0420 08/28/16 0300  Weight: 125 lb 9.6 oz (57 kg) 127 lb 11.2 oz (57.9 kg) 131 lb 8 oz (59.6 kg)    Intake/Output:   Intake/Output Summary (Last 24 hours) at 08/28/16 0756 Last data filed at 08/28/16 0300  Gross per 24 hour  Intake           1061.2 ml  Output             1901 ml  Net           -839.8 ml     Physical Exam: CVP18  General:  Appears fatigued. No resp difficulty HEENT: normal Neck: supple. JVP to jaw . Carotids 2+ bilat; no bruits. No lymphadenopathy or thryomegaly appreciated. Cor: PMI nondisplaced. Regular rate & rhythm. No rubs, gallops or murmurs. Lungs: clear Abdomen: soft, nontender, nondistended. No hepatosplenomegaly. No bruits or masses. Good bowel sounds. Extremities: no cyanosis, clubbing, rash, edema. RUE PICC Neuro: alert & orientedx3, cranial nerves grossly intact. moves all 4 extremities w/o difficulty. Affect pleasant Skin: multiple scars over trunk and extremities.    Telemetry: NSR 60s Personally reviewed.     Labs: Basic Metabolic Panel:  Recent Labs Lab 08/25/16 0351 08/26/16 0449 08/27/16 0428 08/27/16 0539 08/28/16 0422  NA 129* 130*  QUESTIONABLE RESULTS, RECOMMEND RECOLLECT TO VERIFY 128* 131*  K 4.4 4.1 QUESTIONABLE RESULTS, RECOMMEND RECOLLECT TO VERIFY 4.1 4.9  CL 94* 95* QUESTIONABLE RESULTS, RECOMMEND RECOLLECT TO VERIFY 94* 96*  CO2 24 24 QUESTIONABLE RESULTS, RECOMMEND RECOLLECT TO VERIFY 24 23  GLUCOSE 196* 163* QUESTIONABLE RESULTS, RECOMMEND RECOLLECT TO VERIFY 191* 153*  BUN 46* 50* QUESTIONABLE RESULTS, RECOMMEND RECOLLECT TO VERIFY 55* 64*  CREATININE 2.57* 2.72* QUESTIONABLE RESULTS, RECOMMEND RECOLLECT TO VERIFY 2.98* 3.48*  CALCIUM 8.7* 8.8* QUESTIONABLE RESULTS, RECOMMEND RECOLLECT TO VERIFY 8.8* 8.6*  MG 2.3  --   --   --  2.5*    Liver Function Tests: No results for input(s): AST, ALT, ALKPHOS, BILITOT, PROT, ALBUMIN in the last 168 hours. No results for input(s): LIPASE, AMYLASE in the last 168 hours. No results for input(s): AMMONIA in the last 168 hours.  CBC:  Recent Labs Lab 08/22/16 1547 08/27/16 0539  WBC 4.0 4.1  HGB 13.9 11.9*  HCT 41.2 35.1*  MCV 84.8 81.4  PLT 139* 100*    Cardiac Enzymes: No results for input(s): CKTOTAL, CKMB, CKMBINDEX, TROPONINI in the last 168 hours.  BNP: BNP (last 3 results)  Recent Labs  09/28/15 1114  BNP 871.3*    ProBNP (last 3 results) No results for input(s): PROBNP in the last 8760 hours.    Other results:  Imaging: No results found.   Medications:     Scheduled Medications: . apixaban  2.5 mg Oral BID  . aspirin EC  81 mg Oral Daily  . insulin aspart  0-9 Units Subcutaneous Q4H  . magnesium oxide  400 mg Oral Daily  . potassium chloride SA  40 mEq Oral BID  . pravastatin  20 mg Oral q1800  . sildenafil  20 mg Oral TID  . sodium chloride flush  10-40 mL Intracatheter Q12H    Infusions: . amiodarone 30 mg/hr (08/27/16 1800)  . milrinone 0.375 mcg/kg/min (08/27/16 2318)    PRN Medications: acetaminophen, albuterol, guaiFENesin-dextromethorphan, ondansetron (ZOFRAN) IV, sodium chloride  flush   Assessment/Plan/Discussion:   Rogelio is a 56 year old with h/o chronic systolic heart failure, ICM, Biotronik ICD, and PAH admitted today with new onset A Fib/Flutter  1. A flutter RVR New Onset- June 11 with device interrogation.  -S/P TEE DC-CV with conversion to NSR.  Maintaining NSR. Continue amio drip.  Continue eliquis 2.5 mg twice a day.   2. A/C Systolic Heart Failure- ICM. ECHO 10/2015 EF 20-25%. -> cardiogenic shock - echo 08/24/16. LVEF 10% severe RV HK (EF down further in setting of AFL) - milrinone started 6/15 for cardiogenic shock and cardiorenal syndrome  Not a candidate for LVAD with RV failure.  - CO-OX down to 47% despite milrinone 0.375 mcg. Increase milrinone to 0.5 mcg. Check CO-OX at 1100. IF CO-OX remains low will switch to dobutamine.  CVP continues to trend up. Creatinine rising.  Hold diuresis.  - No bb or ACE/ARB/ARNI with low output and AKI .  3. 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  4. PAH - continue sildenafil 20 mg three times a day.  5. CAD S/P multiple interventions.  - No S/S iscehmia.  - Continues asa.  No nitro with sildenafil.  She was unable to take simvastatin due to myalgias. Continue pravastatin.   6. PAD  - occluded left SFA with mild/mod ABi. No revascularization was needed October 2017. 7. H/O LV Thrombus - no thrombus on TEE.  8. Hyponatremia -sodium 131.   I discussed that she may need home inotropes with persistently low CO-OX and that we would consult Palliative Care for GOC if that occurs. She is willing to go home with home inotropes. She remains tenuous with worsening renal function and CO-OX. May need to switch to dobutamine to see if that helps.   Dispostion: on hold.   Length of Stay: 6  Amy Clegg NP-C  08/28/2016, 7:56 AM  Advanced Heart Failure Team Pager [phone] (M-F; 7a - 4p)  Please contact [facility] for night-coverage after hours (4p -7a ) and weekends on [email or web address]  Patient seen and examined with [name], NP. We discussed all aspects of the encounter. I agree with the assessment and plan as stated above.   She is s/p DC-CV of atrial flutter yesterday. TEE showed severe biventricular dysfunction. Remains extremely tenuous. Co-ox remains low. Renal function worsening.  Will increase milrinone and hold diuretics. Low threshold to move to ICU. No acute indications for CVVHD at this point. Continue amiodarone and Eliquis.   GOC discussed.  Arvilla Meres, MD  8:35 AM

## 2016-08-28 NOTE — Progress Notes (Signed)
Cardiology notified of repeat coox being 48.6. Will await new orders & continue to monitor the pt. Sanda LingerMilam, Finlay Godbee R, RN

## 2016-08-28 NOTE — Progress Notes (Signed)
[facility]. Pt transferred to 2H02. Pt as well as dgtr notified of the transfer. Pt's belongings were taken with her. Sanda LingerMilam, Kymiah Araiza R, RN

## 2016-08-28 NOTE — Progress Notes (Signed)
Coox this evening 49.3. MD Bensimhon called and updated with value. Per MD Bensimhon titrate up Levophed to 5 mcg and change Milrione to .375. Will continue to monitor.

## 2016-08-28 NOTE — Progress Notes (Signed)
  Repeat coox 48.6%. Discussed with Dr. Gala RomneyBensimhon. Will transfer to Cardiac ICU to start levophed.     Casimiro NeedleMichael [address]"Andy" Red Crossillery, PA-C 08/28/2016 12:22 PM

## 2016-08-29 ENCOUNTER — Encounter (HOSPITAL_COMMUNITY): Payer: Self-pay | Admitting: Internal Medicine

## 2016-08-29 LAB — BASIC METABOLIC PANEL
Anion gap: 13 (ref 5–15)
BUN: 72 mg/dL — AB (ref 6–20)
CALCIUM: 8.9 mg/dL (ref 8.9–10.3)
CO2: 20 mmol/L — ABNORMAL LOW (ref 22–32)
CREATININE: 4.5 mg/dL — AB (ref 0.44–1.00)
Chloride: 95 mmol/L — ABNORMAL LOW (ref 101–111)
GFR calc Af Amer: 11 mL/min — ABNORMAL LOW (ref >60)
GFR, EST NON AFRICAN AMERICAN: 10 mL/min — AB (ref >60)
GLUCOSE: 201 mg/dL — AB (ref 65–99)
Potassium: 6 mmol/L — ABNORMAL HIGH (ref 3.5–5.1)
Sodium: 128 mmol/L — ABNORMAL LOW (ref 135–145)

## 2016-08-29 LAB — APTT: aPTT: 77 seconds — ABNORMAL HIGH (ref 24–36)

## 2016-08-29 LAB — RENAL FUNCTION PANEL
Albumin: 3 g/dL — ABNORMAL LOW (ref 3.5–5.0)
Anion gap: 12 (ref 5–15)
BUN: 62 mg/dL — AB (ref 6–20)
CHLORIDE: 98 mmol/L — AB (ref 101–111)
CO2: 21 mmol/L — AB (ref 22–32)
CREATININE: 3.96 mg/dL — AB (ref 0.44–1.00)
Calcium: 8.9 mg/dL (ref 8.9–10.3)
GFR calc Af Amer: 13 mL/min — ABNORMAL LOW (ref >60)
GFR, EST NON AFRICAN AMERICAN: 12 mL/min — AB (ref >60)
GLUCOSE: 70 mg/dL (ref 65–99)
POTASSIUM: 4.9 mmol/L (ref 3.5–5.1)
Phosphorus: 3.2 mg/dL (ref 2.5–4.6)
Sodium: 131 mmol/L — ABNORMAL LOW (ref 135–145)

## 2016-08-29 LAB — GLUCOSE, CAPILLARY
GLUCOSE-CAPILLARY: 109 mg/dL — AB (ref 65–99)
GLUCOSE-CAPILLARY: 151 mg/dL — AB (ref 65–99)
Glucose-Capillary: 221 mg/dL — ABNORMAL HIGH (ref 65–99)
Glucose-Capillary: 172 mg/dL — ABNORMAL HIGH (ref 65–99)
Glucose-Capillary: 76 mg/dL (ref 65–99)

## 2016-08-29 LAB — CBC WITH DIFFERENTIAL/PLATELET
Basophils Absolute: 0 10*3/uL (ref 0.0–0.1)
Basophils Relative: 0 %
Eosinophils Absolute: 0.1 10*3/uL (ref 0.0–0.7)
Eosinophils Relative: 1 %
HCT: 33.4 % — ABNORMAL LOW (ref 36.0–46.0)
HEMOGLOBIN: 11.3 g/dL — AB (ref 12.0–15.0)
LYMPHS PCT: 19 %
Lymphs Abs: 1 10*3/uL (ref 0.7–4.0)
MCH: 27.6 pg (ref 26.0–34.0)
MCHC: 33.8 g/dL (ref 30.0–36.0)
MCV: 81.5 fL (ref 78.0–100.0)
MONO ABS: 0.7 10*3/uL (ref 0.1–1.0)
MONOS PCT: 13 %
NEUTROS ABS: 3.5 10*3/uL (ref 1.7–7.7)
NEUTROS PCT: 66 %
Platelets: 112 10*3/uL — ABNORMAL LOW (ref 150–400)
RBC: 4.1 MIL/uL (ref 3.87–5.11)
RDW: 16.7 % — AB (ref 11.5–15.5)
WBC: 5.3 10*3/uL (ref 4.0–10.5)

## 2016-08-29 LAB — COOXEMETRY PANEL
Carboxyhemoglobin: 1.6 % — ABNORMAL HIGH (ref 0.5–1.5)
Methemoglobin: 0.7 % (ref 0.0–1.5)
O2 Saturation: 52.7 %
Total hemoglobin: 12.1 g/dL (ref 12.0–16.0)

## 2016-08-29 LAB — HEPARIN LEVEL (UNFRACTIONATED): Heparin Unfractionated: >2.2 IU/mL — ABNORMAL HIGH (ref 0.30–0.70)

## 2016-08-29 MED ORDER — LIDOCAINE HCL (PF) 1 % IJ SOLN
INTRAMUSCULAR | Status: AC
  Administered 2016-08-29: 10:00:00
  Filled 2016-08-29: qty 30

## 2016-08-29 MED ORDER — PRISMASOL BGK 4/2.5 32-4-2.5 MEQ/L IV SOLN
INTRAVENOUS | Status: DC
  Administered 2016-08-29 – 2016-09-04 (×10): via INTRAVENOUS_CENTRAL
  Filled 2016-08-29 (×17): qty 5000

## 2016-08-29 MED ORDER — SODIUM POLYSTYRENE SULFONATE 15 GM/60ML PO SUSP
15.0000 g | Freq: Once | ORAL | Status: AC
  Administered 2016-08-29: 15 g via ORAL
  Filled 2016-08-29: qty 60

## 2016-08-29 MED ORDER — BENZONATATE 100 MG PO CAPS
100.0000 mg | ORAL_CAPSULE | Freq: Three times a day (TID) | ORAL | Status: DC | PRN
  Administered 2016-08-29 – 2016-09-09 (×3): 100 mg via ORAL
  Filled 2016-08-29 (×4): qty 1

## 2016-08-29 MED ORDER — HEPARIN SODIUM (PORCINE) 1000 UNIT/ML DIALYSIS
1000.0000 [IU] | INTRAMUSCULAR | Status: DC | PRN
  Filled 2016-08-29: qty 6

## 2016-08-29 MED ORDER — PRISMASOL BGK 4/2.5 32-4-2.5 MEQ/L IV SOLN
INTRAVENOUS | Status: DC
  Administered 2016-08-29 – 2016-09-04 (×10): via INTRAVENOUS_CENTRAL
  Filled 2016-08-29 (×17): qty 5000

## 2016-08-29 MED ORDER — HEPARIN (PORCINE) IN NACL 100-0.45 UNIT/ML-% IJ SOLN
1200.0000 [IU]/h | INTRAMUSCULAR | Status: DC
  Administered 2016-08-29 – 2016-08-30 (×2): 900 [IU]/h via INTRAVENOUS
  Administered 2016-08-31 – 2016-09-03 (×3): 1000 [IU]/h via INTRAVENOUS
  Administered 2016-09-04 – 2016-09-05 (×2): 1050 [IU]/h via INTRAVENOUS
  Filled 2016-08-29 (×6): qty 250

## 2016-08-29 MED ORDER — INSULIN GLARGINE 100 UNIT/ML ~~LOC~~ SOLN
5.0000 [IU] | Freq: Every day | SUBCUTANEOUS | Status: DC
Start: 2016-08-29 — End: 2016-09-07
  Administered 2016-08-29 – 2016-09-07 (×10): 5 [IU] via SUBCUTANEOUS
  Filled 2016-08-29 (×10): qty 0.05

## 2016-08-29 MED ORDER — PRISMASOL BGK 4/2.5 32-4-2.5 MEQ/L IV SOLN
INTRAVENOUS | Status: DC
  Administered 2016-08-29 – 2016-09-05 (×29): via INTRAVENOUS_CENTRAL
  Filled 2016-08-29 (×49): qty 5000

## 2016-08-29 MED ORDER — SODIUM CHLORIDE 0.9 % FOR CRRT
INTRAVENOUS_CENTRAL | Status: DC | PRN
  Administered 2016-08-29: 13:00:00 via INTRAVENOUS_CENTRAL
  Filled 2016-08-29: qty 1000

## 2016-08-29 MED ORDER — HYDROCOD POLST-CPM POLST ER 10-8 MG/5ML PO SUER
5.0000 mL | Freq: Two times a day (BID) | ORAL | Status: DC | PRN
  Administered 2016-08-29 – 2016-09-10 (×8): 5 mL via ORAL
  Filled 2016-08-29 (×8): qty 5

## 2016-08-29 NOTE — Progress Notes (Signed)
ANTICOAGULATION CONSULT NOTE - Initial Consult  Pharmacy Consult for Heparin Indication: atrial fibrillation  Allergies  Allergen Reactions  . Nyquil Multi-Symptom [Pseudoeph-Doxylamine-Dm-Apap] Other (See Comments)    Fatigue, chest pressure and pain Pt tolerates Coricidin cough syrup at home  . Sudafed [Pseudoephedrine] Other (See Comments)    Fatigue, chest pressure and pain  . Lasix [Furosemide]     Rash  . Torsemide     Rash  . Zocor [Simvastatin]     Myalgias   . Latex Rash  . Ramipril Cough    Patient Measurements: Height: 5\' 5"  (165.1 cm) Weight: 135 lb 9.3 oz (61.5 kg) IBW/kg (Calculated) : 57  Vital Signs: Temp: 97.4 F (36.3 C) (06/20 1957) Temp Source: Oral (06/20 1957) BP: 120/75 (06/20 2100) Pulse Rate: 61 (06/20 2100)  Labs:  Recent Labs  08/27/16 0539 08/28/16 0422 08/29/16 0435 08/29/16 1558 08/29/16 2004  HGB 11.9*  --  11.3*  --   --   HCT 35.1*  --  33.4*  --   --   PLT 100*  --  112*  --   --   APTT  --   --   --   --  77*  CREATININE 2.98* 3.48* 4.50* 3.96*  --    Estimated Creatinine Clearance: 13.9 mL/min (A) (by C-G formula based on SCr of 3.96 mg/dL (H)).  Medical History: Past Medical History:  Diagnosis Date  . AICD (automatic cardioverter/defibrillator) present    a. Biotronik placed 2009. b. ICD discharge 2011, 03/2013.  [name] Alcohol abuse   . Anxiety   . Arterial embolism (HCC)    a. H/o RLE extremity ischemia in 2012 s/p right iliofemoral embolectomy with compartment fasciotomy.  . Asthma   . CAD (coronary artery disease)    a. 1996: anterior wall MI tx with PTCA. b. 2003: PCI/DES to circumflex marginal. c. 2004: PTCA/CBA/stenting of ostium of D1 and LAD. d. 06/2003: s/p PTCA/DES of mRCA. e. 11/2003: CBA of bifurcation diagonal LAD. f. 2007: PTCA/DES to RCA, CBA/stenting to OM2.  . Chronic systolic heart failure (HCC)    a.  secondary to severe ischemic cardiomyopathy with EF 20-25%;  b. s/p rimplantation of Biotronik single  chamber ICD by Dr Ladona Ridgel 02/2008;  c.  RHC 07/2013 showed well compensated left sided pressures with moderate PAH and low cardiac output. d. 2D Echo 03/2014: EF 20-25%, grade 2 DD, PAP .  . CKD (chronic kidney disease), stage II   . COPD (chronic obstructive pulmonary disease) (HCC)   . Diabetes mellitus, type 2 (HCC)   . Dizziness   . DVT (deep venous thrombosis) (HCC)    a. 06/2010.  [name] GERD (gastroesophageal reflux disease)   . History of noncompliance with medical treatment   . HLD (hyperlipidemia)   . LV (left ventricular) mural thrombus    a. Dx on echo 03/2014.  [name] Myocardial infarction (HCC)   . Pulmonary hypertension (HCC)   . Sleep apnea   . Tobacco abuse   . V-tach Jessica [name])    Assessment: 56yof on eliquis for afib, s/p DCCV on 6/18, now with worsening renal function. Eliquis will be held and she will transition to IV heparin pending trialysis catheter placement and need for CVVHD. Last eliquis dose 6/19 @ 2122. Will use aPTTs to guide initial heparin dosing as eliquis falsely elevates heparin levels.   aPTT therapeutic: 77  Goal of Therapy:  APTT 66-102 seconds Heparin level 0.3-0.7 units/ml Monitor platelets by anticoagulation protocol: Yes   Plan:  1) After trialysis catheter placed, start heparin at 900 units/hr 2) Daily heparin level, aPTT, CBC   Anaih Brander L Analysa Nutting 08/29/2016,9:17 PM

## 2016-08-29 NOTE — Consult Note (Signed)
[facility] Consult Note     Date: 08/29/2016                  Patient Name:  Jessica [name]  MRN: [id number]  DOB: 14-May-1960  Age / Sex: 56 y.o., female         PCP: Patient, No Pcp Per                 Service Requesting Consult: Heart Failure Dr Haroldine Laws                 Reason for Consult: Oliguric AKI in the setting of acute on chronic decompensated systolic HF            Chief Complaint: SOB  HPI: Pt is a 13F with a PMH significant for systolic HF with an EF of 15 %, A fib, pHTN who is now seen in consultation at the request of Dr. Haroldine Laws for evaluation and recommendations surrounding acute oliguric kidney injury.    Pt was admitted to [facility] for acute decompensated HF and new onset atrial flutter.  She was initially treated with high-dose diuretics which improved her urine output; unfortunately mixed venous sats remained quite low and she was placed on milrinone with little effect.  She was transferred to the ICU for norepinephrine infusion.  She underwent successful cardioversion during this admission as well.  She is now oliguric.  Her creatinine has risen from a baseline of 1.4-1.5--> 4.50.    She is SOB at rest.  There is consideration of going home on inotropes.  Past Medical History:  Diagnosis Date  . AICD (automatic cardioverter/defibrillator) present    a. Biotronik placed 2009. b. ICD discharge 2011, 03/2013.  [name] Alcohol abuse   . Anxiety   . Arterial embolism ([place])    a. H/o RLE extremity ischemia in 2012 s/p right iliofemoral embolectomy with compartment fasciotomy.  . Asthma   . CAD (coronary artery disease)    a. 1996: anterior wall MI tx with PTCA. b. 2003: PCI/DES to circumflex marginal. c. 2004: PTCA/CBA/stenting of ostium of D1 and LAD. d. 06/2003: s/p PTCA/DES of mRCA. e. 11/2003: CBA of bifurcation diagonal LAD. f. 2007: PTCA/DES to RCA, CBA/stenting to OM2.  . Chronic systolic heart failure ([place])    a.  secondary to severe ischemic  cardiomyopathy with EF 20-25%;  b. s/p rimplantation of Biotronik single chamber ICD by Dr Lovena Le 02/2008;  c.  [place] 07/2013 showed well compensated left sided pressures with moderate PAH and low cardiac output. d. 2D Echo 03/2014: EF 20-25%, grade 2 DD, PAP 2mHg.  . CKD (chronic kidney disease), stage II   . COPD (chronic obstructive pulmonary disease) ([place]   . Diabetes mellitus, type 2 ([place]   . Dizziness   . DVT (deep venous thrombosis) ([place]    a. 06/2010.  .Marland KitchenGERD (gastroesophageal reflux disease)   . History of noncompliance with medical treatment   . HLD (hyperlipidemia)   . LV (left ventricular) mural thrombus    a. Dx on echo 03/2014.  .Marland KitchenMyocardial infarction ([facility]   . Pulmonary hypertension ([place]   . Sleep apnea   . Tobacco abuse   . V-tach ([facility]     Past Surgical History:  Procedure Laterality Date  . CARDIAC CATHETERIZATION    . DIAGNOSTIC LAPAROSCOPY    . ICD GENERATOR CHANGEOUT N/A 07/20/2016   Procedure: ICD Generator Changeout;  Surgeon: TEvans Lance MD;  [facility];  Service: Cardiovascular;  Laterality: N/A;  . INSERT / REPLACE / REMOVE PACEMAKER    . RIGHT HEART CATHETERIZATION N/A 07/23/2013   Procedure: RIGHT HEART CATH;  Surgeon: Jolaine Artist, MD;  Location: [facility] CATH LAB;  Service: Cardiovascular;  Laterality: N/A;  . TUBAL LIGATION      Family History  Problem Relation Age of Onset  . Coronary artery disease Unknown        FMILY HISTORY   Social History:  reports that she has been smoking Cigars.  She has a 5.25 pack-year smoking history. She has never used smokeless tobacco. She reports that she does not drink alcohol or use drugs.  Allergies:  Allergies  Allergen Reactions  . Nyquil Multi-Symptom [Pseudoeph-Doxylamine-Dm-Apap] Other (See Comments)    Fatigue, chest pressure and pain Pt tolerates Coricidin cough syrup at home  . Sudafed [Pseudoephedrine] Other (See Comments)    Fatigue, chest pressure and pain  . Lasix  [Furosemide]     Rash  . Torsemide     Rash  . Zocor [Simvastatin]     Myalgias   . Latex Rash  . Ramipril Cough    Medications Prior to Admission  Medication Sig Dispense Refill  . acetaminophen (TYLENOL) 500 MG tablet Take 1,000 mg by mouth every 6 (six) hours as needed (pain).     [name] albuterol (PROVENTIL) (2.5 MG/3ML) 0.083% nebulizer solution Take 2.5 mg by nebulization every 6 (six) hours as needed for wheezing or shortness of breath.    [name] aspirin EC 81 MG tablet Take 1 tablet (81 mg total) by mouth every evening. Resume aspirin 1 week following discharge.    . digoxin (LANOXIN) 0.125 MG tablet Take 1 tablet (0.125 mg total) by mouth every evening. 90 tablet 3  . ethacrynic acid (EDECRIN) 25 MG tablet Take 6 tablets (150 mg total) by mouth 2 (two) times daily. 360 tablet 6  . insulin glargine (LANTUS) 100 UNIT/ML injection Inject 5 Units into the skin daily as needed (if blood sugar >200).    . magnesium oxide (MAG-OX) 400 (241.3 Mg) MG tablet TAKE 1/2 TABLET ('200MG'$ ) EVERY DAY IN THE EVENING 15 tablet 3  . metolazone (ZAROXOLYN) 2.5 MG tablet Take once daily as needed (3 x max per week) for weight gain/swelling. Take extra 20 meq potassium pill with this. 48 tablet 3  . potassium chloride SA (KLOR-CON M20) 20 MEQ tablet Take 40 mg (2 tabs) in am and 20 meq (1 tab) in pm 90 tablet 1  . spironolactone (ALDACTONE) 25 MG tablet TAKE 1 TABLET (25 MG TOTAL) BY MOUTH DAILY EVERY EVENING 90 tablet 3    Results for orders placed or performed during the hospital encounter of 08/22/16 (from the past 48 hour(s))  Glucose, capillary     Status: Abnormal   Collection Time: 08/27/16 11:51 AM  Result Value Ref Range   Glucose-Capillary 187 (H) 65 - 99 mg/dL  Glucose, capillary     Status: Abnormal   Collection Time: 08/27/16  4:47 PM  Result Value Ref Range   Glucose-Capillary 271 (H) 65 - 99 mg/dL  Glucose, capillary     Status: Abnormal   Collection Time: 08/27/16  8:58 PM  Result Value Ref  Range   Glucose-Capillary 209 (H) 65 - 99 mg/dL  Glucose, capillary     Status: Abnormal   Collection Time: 08/28/16 12:48 AM  Result Value Ref Range   Glucose-Capillary 171 (H) 65 - 99 mg/dL  .Cooxemetry Panel (carboxy, met, total hgb, O2 sat)  Status: Abnormal   Collection Time: 08/28/16  4:15 AM  Result Value Ref Range   Total hemoglobin 12.1 12.0 - 16.0 g/dL   O2 Saturation 47.1 %   Carboxyhemoglobin 1.7 (H) 0.5 - 1.5 %   Methemoglobin 0.9 0.0 - 1.5 %  Basic metabolic panel     Status: Abnormal   Collection Time: 08/28/16  4:22 AM  Result Value Ref Range   Sodium 131 (L) 135 - 145 mmol/L   Potassium 4.9 3.5 - 5.1 mmol/L   Chloride 96 (L) 101 - 111 mmol/L   CO2 23 22 - 32 mmol/L   Glucose, Bld 153 (H) 65 - 99 mg/dL   BUN 64 (H) 6 - 20 mg/dL   Creatinine, Ser 3.48 (H) 0.44 - 1.00 mg/dL   Calcium 8.6 (L) 8.9 - 10.3 mg/dL   GFR calc non Af Amer 13 (L) >60 mL/min   GFR calc Af Amer 16 (L) >60 mL/min    Comment: (NOTE) The eGFR has been calculated using the CKD EPI equation. This calculation has not been validated in all clinical situations. eGFR's persistently <60 mL/min signify possible Chronic Kidney Disease.    Anion gap 12 5 - 15  Magnesium     Status: Abnormal   Collection Time: 08/28/16  4:22 AM  Result Value Ref Range   Magnesium 2.5 (H) 1.7 - 2.4 mg/dL  Glucose, capillary     Status: Abnormal   Collection Time: 08/28/16  4:25 AM  Result Value Ref Range   Glucose-Capillary 160 (H) 65 - 99 mg/dL  Glucose, capillary     Status: Abnormal   Collection Time: 08/28/16  7:43 AM  Result Value Ref Range   Glucose-Capillary 153 (H) 65 - 99 mg/dL  .Cooxemetry Panel (carboxy, met, total hgb, O2 sat)     Status: Abnormal   Collection Time: 08/28/16 11:10 AM  Result Value Ref Range   Total hemoglobin 10.8 (L) 12.0 - 16.0 g/dL   O2 Saturation 48.6 %   Carboxyhemoglobin 1.3 0.5 - 1.5 %   Methemoglobin 1.2 0.0 - 1.5 %  Glucose, capillary     Status: Abnormal   Collection  Time: 08/28/16 11:35 AM  Result Value Ref Range   Glucose-Capillary 256 (H) 65 - 99 mg/dL  Glucose, capillary     Status: Abnormal   Collection Time: 08/28/16  4:15 PM  Result Value Ref Range   Glucose-Capillary 162 (H) 65 - 99 mg/dL   Comment 1 Notify RN   .Cooxemetry Panel (carboxy, met, total hgb, O2 sat)     Status: None   Collection Time: 08/28/16  6:35 PM  Result Value Ref Range   Total hemoglobin 12.2 12.0 - 16.0 g/dL   O2 Saturation 49.3 %   Carboxyhemoglobin 1.2 0.5 - 1.5 %   Methemoglobin 1.1 0.0 - 1.5 %  Glucose, capillary     Status: Abnormal   Collection Time: 08/28/16  7:24 PM  Result Value Ref Range   Glucose-Capillary 246 (H) 65 - 99 mg/dL   Comment 1 Capillary Specimen   Glucose, capillary     Status: Abnormal   Collection Time: 08/28/16 11:21 PM  Result Value Ref Range   Glucose-Capillary 189 (H) 65 - 99 mg/dL   Comment 1 Capillary Specimen   Basic metabolic panel     Status: Abnormal   Collection Time: 08/29/16  4:35 AM  Result Value Ref Range   Sodium 128 (L) 135 - 145 mmol/L   Potassium 6.0 (H) 3.5 - 5.1  mmol/L    Comment: NO VISIBLE HEMOLYSIS   Chloride 95 (L) 101 - 111 mmol/L   CO2 20 (L) 22 - 32 mmol/L   Glucose, Bld 201 (H) 65 - 99 mg/dL   BUN 72 (H) 6 - 20 mg/dL   Creatinine, Ser 4.50 (H) 0.44 - 1.00 mg/dL   Calcium 8.9 8.9 - 10.3 mg/dL   GFR calc non Af Amer 10 (L) >60 mL/min   GFR calc Af Amer 11 (L) >60 mL/min    Comment: (NOTE) The eGFR has been calculated using the CKD EPI equation. This calculation has not been validated in all clinical situations. eGFR's persistently <60 mL/min signify possible Chronic Kidney Disease.    Anion gap 13 5 - 15  CBC with Differential/Platelet     Status: Abnormal   Collection Time: 08/29/16  4:35 AM  Result Value Ref Range   WBC 5.3 4.0 - 10.5 K/uL   RBC 4.10 3.87 - 5.11 MIL/uL   Hemoglobin 11.3 (L) 12.0 - 15.0 g/dL   HCT 33.4 (L) 36.0 - 46.0 %   MCV 81.5 78.0 - 100.0 fL   MCH 27.6 26.0 - 34.0 pg    MCHC 33.8 30.0 - 36.0 g/dL   RDW 16.7 (H) 11.5 - 15.5 %   Platelets 112 (L) 150 - 400 K/uL    Comment: CONSISTENT WITH PREVIOUS RESULT   Neutrophils Relative % 66 %   Neutro Abs 3.5 1.7 - 7.7 K/uL   Lymphocytes Relative 19 %   Lymphs Abs 1.0 0.7 - 4.0 K/uL   Monocytes Relative 13 %   Monocytes Absolute 0.7 0.1 - 1.0 K/uL   Eosinophils Relative 1 %   Eosinophils Absolute 0.1 0.0 - 0.7 K/uL   Basophils Relative 0 %   Basophils Absolute 0.0 0.0 - 0.1 K/uL  Glucose, capillary     Status: Abnormal   Collection Time: 08/29/16  4:48 AM  Result Value Ref Range   Glucose-Capillary 221 (H) 65 - 99 mg/dL   Comment 1 Capillary Specimen   .Cooxemetry Panel (carboxy, met, total hgb, O2 sat)     Status: Abnormal   Collection Time: 08/29/16  4:55 AM  Result Value Ref Range   Total hemoglobin 12.1 12.0 - 16.0 g/dL   O2 Saturation 52.7 %   Carboxyhemoglobin 1.6 (H) 0.5 - 1.5 %   Methemoglobin 0.7 0.0 - 1.5 %  Glucose, capillary     Status: Abnormal   Collection Time: 08/29/16  8:07 AM  Result Value Ref Range   Glucose-Capillary 151 (H) 65 - 99 mg/dL   Comment 1 Notify RN    No results found.  ROS: all other systems reviewed and are negative except as per HPI  Blood pressure 92/61, pulse 86, temperature 97.9 F (36.6 C), temperature source Oral, resp. rate (!) 22, height '5\' 5"'$  (1.651 m), weight 61.5 kg (135 lb 9.3 oz), SpO2 94 %. Physical Exam  GEN thin female, visibly SOB HEENT EOMI, PERRL NECK JVD to angle of mandible with lots of collaterals PULM bilateral crackles in the bases CV RRR LV heave, + S3 ABD distended, hypoactive bowel sounds EXT slightly cool, 2+ LE edema NEURO nonfocal SKIN no lesions MSK no synovitis  Assessment/Plan  1.  Acute oliguric kidney injury: secondary to decompensated HF.  I think that a trial of dialysis is reasonable.  Hopefully we can get her back to euvolemia.  There is a possibility that she may go home on home inotropes; if so then long term  dialysis  is off the table (and to be frank she's not a good candidate anyway).  Will start CVVHD.  2.  Acute decompensated HF/ cardiogenic shock: on milrinone and levophed.  CVPs in the 39s.    3.  New Aflutter: s/p successful DCCV.  In NSR.  4.  PAH: on sildenafil 20 mg TID.  5.  Aflutter/ h/o LV thrombus: on Eliquis.  Would dose-reduce to 2.5 mg BID for now.    Madelon Lips MD [facility] pgr [phone] 08/29/2016, 9:32 AM

## 2016-08-29 NOTE — Progress Notes (Addendum)
Advanced Heart Failure Rounding Note   Subjective:    Admitted with new onset atrial flutter and ADHF.  Milrinone started 6/15 for worsening HF and renal failure.  S/p TEE and successful DC-CV   Yesterday milrinone was increased to 0.5 with persistent depressed mixed venous sat.  Diuretics held with worsening renal failure.  Recheck coox remained depressed in 40s, so started on levophed and transferred to unit.     Last night levophed increased to 5 and milrinone decreased to 0.375 in attempt to optimize pressor support.  Mixed venous sat remains low at 52.7% this am. Creatinine worse at 4.5 and now K up to 6.0.     Feeling OK this am. Mild lightheadedness with standing this am.  No SOB at rest. Denies tachypalpitations or chest pain. Willing to undergo short term dialysis.   Objective:   Weight Range:  Vital Signs:   Temp:  [97.7 F (36.5 C)-98.7 F (37.1 C)] 98.6 F (37 C) (06/20 0400) Pulse Rate:  [59-89] 89 (06/20 0700) Resp:  [15-32] 16 (06/20 0700) BP: (80-117)/(51-96) 101/71 (06/20 0700) SpO2:  [90 %-100 %] 97 % (06/20 0700) Weight:  [135 lb 9.3 oz (61.5 kg)] 135 lb 9.3 oz (61.5 kg) (06/20 0400) Last BM Date: 08/28/16  Weight change: Filed Weights   08/27/16 0420 08/28/16 0300 08/29/16 0400  Weight: 127 lb 11.2 oz (57.9 kg) 131 lb 8 oz (59.6 kg) 135 lb 9.3 oz (61.5 kg)    Intake/Output:   Intake/Output Summary (Last 24 hours) at 08/29/16 0721 Last data filed at 08/29/16 0700  Gross per 24 hour  Intake           906.23 ml  Output              201 ml  Net           705.23 ml     Physical Exam: CVP ~18 General: Fatigued and ill appearing. No resp difficulty. HEENT: normal Neck: supple. JVP 5-6. Carotids 2+ bilat; no bruits. No thyromegaly or nodule noted. Cor: PMI nondisplaced. RRR, No M/G/R noted Lungs: CTAB, normal effort. Abdomen: soft, non-tender, distended, no HSM. No bruits or masses. +BS  Extremities: no cyanosis, clubbing, rash, R and LLE no  edema.  Neuro: alert & orientedx3, cranial nerves grossly intact. moves all 4 extremities w/o difficulty. Affect pleasant   Telemetry: Personally reviewed, NSR 80s    Labs: Basic Metabolic Panel:  Recent Labs Lab 08/25/16 0351 08/26/16 0449 08/27/16 0428 08/27/16 0539 08/28/16 0422 08/29/16 0435  NA 129* 130* QUESTIONABLE RESULTS, RECOMMEND RECOLLECT TO VERIFY 128* 131* 128*  K 4.4 4.1 QUESTIONABLE RESULTS, RECOMMEND RECOLLECT TO VERIFY 4.1 4.9 6.0*  CL 94* 95* QUESTIONABLE RESULTS, RECOMMEND RECOLLECT TO VERIFY 94* 96* 95*  CO2 24 24 QUESTIONABLE RESULTS, RECOMMEND RECOLLECT TO VERIFY 24 23 20*  GLUCOSE 196* 163* QUESTIONABLE RESULTS, RECOMMEND RECOLLECT TO VERIFY 191* 153* 201*  BUN 46* 50* QUESTIONABLE RESULTS, RECOMMEND RECOLLECT TO VERIFY 55* 64* 72*  CREATININE 2.57* 2.72* QUESTIONABLE RESULTS, RECOMMEND RECOLLECT TO VERIFY 2.98* 3.48* 4.50*  CALCIUM 8.7* 8.8* QUESTIONABLE RESULTS, RECOMMEND RECOLLECT TO VERIFY 8.8* 8.6* 8.9  MG 2.3  --   --   --  2.5*  --     Liver Function Tests: No results for input(s): AST, ALT, ALKPHOS, BILITOT, PROT, ALBUMIN in the last 168 hours. No results for input(s): LIPASE, AMYLASE in the last 168 hours. No results for input(s): AMMONIA in the last 168 hours.  CBC:  Recent Labs  Lab 08/22/16 1547 08/27/16 0539 08/29/16 0435  WBC 4.0 4.1 5.3  NEUTROABS  --   --  3.5  HGB 13.9 11.9* 11.3*  HCT 41.2 35.1* 33.4*  MCV 84.8 81.4 81.5  PLT 139* 100* 112*    Cardiac Enzymes: No results for input(s): CKTOTAL, CKMB, CKMBINDEX, TROPONINI in the last 168 hours.  BNP: BNP (last 3 results)  Recent Labs  09/28/15 1114  BNP 871.3*    ProBNP (last 3 results) No results for input(s): PROBNP in the last 8760 hours.    Other results:  Imaging: No results found.   Medications:     Scheduled Medications: . apixaban  2.5 mg Oral BID  . aspirin EC  81 mg Oral Daily  . insulin aspart  0-9 Units Subcutaneous Q4H  . magnesium oxide   400 mg Oral Daily  . pravastatin  20 mg Oral q1800  . sildenafil  20 mg Oral TID  . sodium chloride flush  10-40 mL Intracatheter Q12H    Infusions: . amiodarone 30 mg/hr (08/29/16 0400)  . milrinone 0.375 mcg/kg/min (08/29/16 0400)  . norepinephrine (LEVOPHED) Adult infusion 5 mcg/min (08/29/16 0700)    PRN Medications: acetaminophen, albuterol, guaiFENesin-dextromethorphan, ondansetron (ZOFRAN) IV, sodium chloride flush   Assessment/Plan/Discussion:   Keianna is a 56 year old with h/o chronic systolic heart failure, ICM, Biotronik ICD, and PAH admitted today with new onset A Fib/Flutter  1. A flutter RVR New Onset- June 11 with device interrogation.  -S/P TEE DC-CV with conversion to NSR.  - Maintaining NSR. Continue amio gtt.  - Will have to hold eliquis with worsening renal failure and indications for CVVHD.    2. A/C Systolic Heart Failure- ICM. ECHO 10/2015 EF 20-25%. -> cardiogenic shock - echo 08/24/16. LVEF 10% severe RV HK (EF down further in setting of AFL) - Volume status elevated with CVP 18. Had not been responding to diuretics with cardiorenal syndrome. Will need trialysis cath placed.  - milrinone started 6/15 for cardiogenic shock and cardiorenal syndrome  Not a candidate for LVAD with RV failure.  - CO-OX mildly improved but remains depressed at 52%.  - Continue milrinone 0.375 and levophed 5 mcg for now. Will titrate as needed throughout the day. Suspect she will need more levophed to tolerate dialysis.  - No bb or ACE/ARB/ARNI with low output and AKI .  3. Acute on chronic renal failure (baseline CKD 3) due to cardio-renal syndrome - Creatinine trending up 2.98> 3.48 > 4.5 - Have consulted nephrology for possible CVVHD.  4. PAH - Continue sildenafil 20 mg TID for now.  5. CAD S/P multiple interventions.  - No S/S iscehmia.  - Continues asa.  No nitro with sildenafil.  - She was unable to take simvastatin due to myalgias. Continue pravastatin.  No change.    6. PAD  - occluded left SFA with mild/mod ABi. No revascularization was needed October 2017. No change.  7. H/O LV Thrombus - no thrombus on TEE. No change 8. Hyponatremia -sodium 128 this am.  Fluid restrict.   9. Hyperkalemia - in setting of ARF. Will give Kayexalate.   Have consulted Nephrology to discuss and initiate CVVHD. Have consulted IR and CCM to place Trialysis cath. Both are limited in scheduling this am, so whichever is free first will place.   Dispostion: On hold. Prognosis guarded.   Length of Stay: [address]  Luane [employer] 08/29/2016, 7:21 AM  Advanced Heart Failure Team Pager [phone] (M-F; 7a - 4p)  Please contact [facility] for night-coverage after hours (4p -7a ) and weekends on [email or web address]   Agree. She has worsening renal failure today with hyperkalemia in the setting of severe biventricular HF, cardiogenic shock and ATN. Moved to ICU yesterday. Now on norepi. CVP remains. High. (has severe RV dysfunction). Co-ox still low. Creatinine up to 4.5.  Will plan placement of Trialysis catheter and initiation of CVVHD. Appreciate CCM and Renal's help - I d/w them personally.  She remains in NSR after DC-CV for AFL. Will hold Eliquis this am for catheter placement and restart after procedure.   Hopefully her heart will get some better with restoration of NSR but she may be nearing end-stage.   On exam Weak NAD JVP to ear COr RRR +s3 Lungs basilar crackles Ab soft mildly distended Ext cool   As above, initiate CVVHD. Continue inotropic support. Likely not long-term HD candidate. If kidneys don't recover will need hospice.   CRITICAL CARE Performed by: Arvilla Meres  Total critical care time: 35 minutes  Critical care time was exclusive of separately billable procedures and treating other patients.  Critical care was necessary to treat or prevent imminent or life-threatening deterioration.  Critical care was time spent personally by me  (independent of midlevel providers or residents) on the following activities: development of treatment plan with patient and/or surrogate as well as nursing, discussions with consultants, evaluation of patient's response to treatment, examination of patient, obtaining history from patient or surrogate, ordering and performing treatments and interventions, ordering and review of laboratory studies, ordering and review of radiographic studies, pulse oximetry and re-evaluation of patient's condition.  Arvilla Meres, MD  9:20 AM

## 2016-08-29 NOTE — Procedures (Signed)
Patient seen and examined on CRRT.  Tolerating well.   QB 250 mL/ min per R fem nontunneled catheter.  UF goal 100 mL/ hr.  Treatment adjusted as needed.  Bufford ButtnerElizabeth Jarron Curley MD  [facility] pgr [phone] 4:48 PM

## 2016-08-29 NOTE — Progress Notes (Signed)
NOTIFIED CHARGE NURSE OF PT HAVING CVVHD ORDERS.

## 2016-08-29 NOTE — Progress Notes (Signed)
ANTICOAGULATION CONSULT NOTE - Initial Consult  Pharmacy Consult for Heparin Indication: atrial fibrillation  Allergies  Allergen Reactions  . Nyquil Multi-Symptom [Pseudoeph-Doxylamine-Dm-Apap] Other (See Comments)    Fatigue, chest pressure and pain Pt tolerates Coricidin cough syrup at home  . Sudafed [Pseudoephedrine] Other (See Comments)    Fatigue, chest pressure and pain  . Lasix [Furosemide]     Rash  . Torsemide     Rash  . Zocor [Simvastatin]     Myalgias   . Latex Rash  . Ramipril Cough    Patient Measurements: Height: 5\' 5"  (165.1 cm) Weight: 135 lb 9.3 oz (61.5 kg) IBW/kg (Calculated) : 57  Vital Signs: Temp: 97.9 F (36.6 C) (06/20 0807) Temp Source: Oral (06/20 0807) BP: 101/71 (06/20 0700) Pulse Rate: 89 (06/20 0700)  Labs:  Recent Labs  08/27/16 0539 08/28/16 0422 08/29/16 0435  HGB 11.9*  --  11.3*  HCT 35.1*  --  33.4*  PLT 100*  --  112*  CREATININE 2.98* 3.48* 4.50*    Estimated Creatinine Clearance: 12.3 mL/min (A) (by C-G formula based on SCr of 4.5 mg/dL (H)).   Medical History: Past Medical History:  Diagnosis Date  . AICD (automatic cardioverter/defibrillator) present    a. Biotronik placed 2009. b. ICD discharge 2011, 03/2013.  [name]. Alcohol abuse   . Anxiety   . Arterial embolism (HCC)    a. H/o RLE extremity ischemia in 2012 s/p right iliofemoral embolectomy with compartment fasciotomy.  . Asthma   . CAD (coronary artery disease)    a. 1996: anterior wall MI tx with PTCA. b. 2003: PCI/DES to circumflex marginal. c. 2004: PTCA/CBA/stenting of ostium of D1 and LAD. d. 06/2003: s/p PTCA/DES of mRCA. e. 11/2003: CBA of bifurcation diagonal LAD. f. 2007: PTCA/DES to RCA, CBA/stenting to OM2.  . Chronic systolic heart failure (HCC)    a.  secondary to severe ischemic cardiomyopathy with EF 20-25%;  b. s/p rimplantation of Biotronik single chamber ICD by Dr Ladona Ridgelaylor 02/2008;  c.  RHC 07/2013 showed well compensated left sided pressures with  moderate PAH and low cardiac output. d. 2D Echo 03/2014: EF 20-25%, grade 2 DD, PAP 58mmHg.  . CKD (chronic kidney disease), stage II   . COPD (chronic obstructive pulmonary disease) (HCC)   . Diabetes mellitus, type 2 (HCC)   . Dizziness   . DVT (deep venous thrombosis) (HCC)    a. 06/2010.  [name]. GERD (gastroesophageal reflux disease)   . History of noncompliance with medical treatment   . HLD (hyperlipidemia)   . LV (left ventricular) mural thrombus    a. Dx on echo 03/2014.  [name]. Myocardial infarction (HCC)   . Pulmonary hypertension (HCC)   . Sleep apnea   . Tobacco abuse   . V-tach [facility](HCC)    Assessment: 56yof on eliquis for afib, s/p DCCV on 6/18, now with worsening renal function. Eliquis will be held and she will transition to IV heparin pending trialysis catheter placement and need for CVVHD. Last eliquis dose 6/19 @ 2122. Will use aPTTs to guide initial heparin dosing as eliquis falsely elevates heparin levels.   Goal of Therapy:  APTT 66-102 seconds Heparin level 0.3-0.7 units/ml Monitor platelets by anticoagulation protocol: Yes   Plan:  1) After trialysis catheter placed, start heparin at 900 units/hr 2) 8 hour heparin level and aPTT 3) Daily heparin level, aPTT, CBC   Fredrik RiggerMarkle, Corneilus Heggie Sue 08/29/2016,8:12 AM

## 2016-08-29 NOTE — Procedures (Signed)
Hemodialysis Insertion Procedure Note Jessica MoccasinSherita [name] [id number] 12/04/1960  Procedure: Insertion of Hemodialysis Catheter Type: 3 port  Indications: Hemodialysis   Procedure Details Consent: Risks of procedure as well as the alternatives and risks of each were explained to the (patient/caregiver).  Consent for procedure obtained. Time Out: Verified patient identification, verified procedure, site/side was marked, verified correct patient position, special equipment/implants available, medications/allergies/relevent history reviewed, required imaging and test results available.  Performed  Maximum sterile technique was used including antiseptics, cap, gloves, gown, hand hygiene, mask and sheet. Skin prep: Chlorhexidine; local anesthetic administered A antimicrobial bonded/coated triple lumen catheter was placed in the right femoral vein due to patient being a dialysis patient using the Seldinger technique. Ultrasound guidance used.Yes.   Catheter placed to 20 cm. Blood aspirated via all 3 ports and then flushed x 3. Line sutured x 2 and dressing applied.  Evaluation Blood flow good Complications: No apparent complications Patient did tolerate procedure well. Chest X-ray ordered to verify placement. Not needed for femoral site.  Rt femoral vein and artery.  Jessica CanalesSteve [name] ACNP Jessica PollackLe [name] PCCM Pager [phone] till 3 pm If no answer page [phone] 08/29/2016, 10:44 AM  Jessica ReedyWesam G. Tawnia [name], M.D. [facility]. Pager: [phone]. After hours pager: [phone].

## 2016-08-29 NOTE — Care Management Note (Signed)
Case Management Note Previous CM note initiated by Gala LewandowskyGraves-Bigelow, Brenda Kaye, RN--08/23/2016, 4:55 PM    Patient Details  Name: Jennye MoccasinSherita Arauz MRN: [id number] Date of Birth: [birth year]  Subjective/Objective: Pt presented for New onset A Fib. Initially started on IV Amio gtt. Benefits Check completed for Eliquis. Pt will need 30 day free card prior to d/c.                     Action/Plan: S/W JAMIE @ [facility] # [phone]    1. ELIQUIS  2.5 MG BID   COVER- YES  CO-PAY-$ 38.70  TIER- 2 DRUG  PRIOR APPROVAL-NO   2. ELIQUIS  5 MG BID   COVER- YES  CO-PAY- $ 40.00  TIER- 2 DRUG  PRIOR APPROVAL- NO   PHARMACY : [facility]   Expected Discharge Date:                  Expected Discharge Plan:  Home/Self Care  In-House Referral:  NA  Discharge planning Services  CM Consult, Medication Assistance  Post Acute Care Choice:    Choice offered to:     DME Arranged:    DME Agency:     HH Arranged:    HH Agency:     Status of Service:  In process, will continue to follow  If discussed at Long Length of Stay Meetings, dates discussed:  08-28-16  Additional Comments:  08/29/16- 1000- Donn PieriniKristi Kandy Towery RN, CM- pt with worsening renal failure- moved to ICU- Will plan placement of Trialysis catheter and initiation of CVVHD- now on norepi, continue Milrinone,  Prognosis guarded- CM to continue to follow for d/c needs   08-28-16 1001 Tomi BambergerBrenda Graves-Bigelow, RN, BSN [phone] Pt post successful TEE Cardioversion 08-27-16. Pt continues on IV Milrinone and IV Lasix. Pt will continue to monitor for disposition needs.    1508 08-27-16 Tomi BambergerBrenda Graves-Bigelow, RN,BSN [phone] Pt continues on IV Amio gtt, IV Lasix and Milrinone gtt. Cardioversion 08-27-16. Pt has DME RW, Cane and Nebulizer at home. CM will continue to monitor for disposition needs.    Zenda AlpersWebster, Quasset LakeKristi Hall, RN 08/29/2016, 10:02 AM [phone]

## 2016-08-29 NOTE — Progress Notes (Signed)
Inpatient Diabetes Program Recommendations  AACE/ADA: New Consensus Statement on Inpatient Glycemic Control (2015)  Target Ranges:  Prepandial:   less than 140 mg/dL      Peak postprandial:   less than 180 mg/dL (1-2 hours)      Critically ill patients:  140 - 180 mg/dL   Lab Results  Component Value Date   GLUCAP 151 (H) 08/29/2016   HGBA1C 9.8 (H) 01/14/2015    Review of Glycemic Control Results for Jessica [name], Jessica [name] (MRN [id number]) as of 08/29/2016 09:49  Ref. Range 08/28/2016 19:24 08/28/2016 23:21 08/29/2016 04:48 08/29/2016 08:07  Glucose-Capillary Latest Ref Range: 65 - 99 mg/dL 409246 (H) 811189 (H) 914221 (H) 151 (H)   Diabetes history: DM2 Outpatient Diabetes medications: Lantus 5 units as needed Current orders for Inpatient glycemic control: Novolog correction 0-9 units q 4 hrs.  Inpatient Diabetes Program Recommendations:  Noted elevated CBGs and patient now eating. Please consider: -Change diet to include carb modified -Add Lantus 5 units daily  Thank you, Darel HongJudy E. Xzayvier Fagin, RN, MSN, CDE  Diabetes Coordinator Inpatient Glycemic Control Team Team Pager [phone] (8am-5pm) 08/29/2016 9:51 AM

## 2016-08-30 DIAGNOSIS — R570 Cardiogenic shock: Secondary | ICD-10-CM

## 2016-08-30 DIAGNOSIS — I272 Pulmonary hypertension, unspecified: Secondary | ICD-10-CM

## 2016-08-30 LAB — GLUCOSE, CAPILLARY
GLUCOSE-CAPILLARY: 147 mg/dL — AB (ref 65–99)
GLUCOSE-CAPILLARY: 243 mg/dL — AB (ref 65–99)
GLUCOSE-CAPILLARY: 187 mg/dL — AB (ref 65–99)
Glucose-Capillary: 118 mg/dL — ABNORMAL HIGH (ref 65–99)
Glucose-Capillary: 302 mg/dL — ABNORMAL HIGH (ref 65–99)
Glucose-Capillary: 134 mg/dL — ABNORMAL HIGH (ref 65–99)

## 2016-08-30 LAB — RENAL FUNCTION PANEL
ALBUMIN: 2.9 g/dL — AB (ref 3.5–5.0)
ANION GAP: 9 (ref 5–15)
ANION GAP: 9 (ref 5–15)
Albumin: 3.1 g/dL — ABNORMAL LOW (ref 3.5–5.0)
BUN: 39 mg/dL — ABNORMAL HIGH (ref 6–20)
BUN: 28 mg/dL — AB (ref 6–20)
CALCIUM: 8.6 mg/dL — AB (ref 8.9–10.3)
CALCIUM: 8.3 mg/dL — AB (ref 8.9–10.3)
CO2: 23 mmol/L (ref 22–32)
CO2: 25 mmol/L (ref 22–32)
Chloride: 98 mmol/L — ABNORMAL LOW (ref 101–111)
Chloride: 96 mmol/L — ABNORMAL LOW (ref 101–111)
Creatinine, Ser: 2.82 mg/dL — ABNORMAL HIGH (ref 0.44–1.00)
Creatinine, Ser: 2.26 mg/dL — ABNORMAL HIGH (ref 0.44–1.00)
GFR calc Af Amer: 26 mL/min — ABNORMAL LOW (ref >60)
GFR calc non Af Amer: 17 mL/min — ABNORMAL LOW (ref >60)
GFR calc non Af Amer: 23 mL/min — ABNORMAL LOW (ref >60)
GFR, EST AFRICAN AMERICAN: 20 mL/min — AB (ref >60)
Glucose, Bld: 117 mg/dL — ABNORMAL HIGH (ref 65–99)
Glucose, Bld: 290 mg/dL — ABNORMAL HIGH (ref 65–99)
PHOSPHORUS: 2.5 mg/dL (ref 2.5–4.6)
POTASSIUM: 4.8 mmol/L (ref 3.5–5.1)
Phosphorus: 2.4 mg/dL — ABNORMAL LOW (ref 2.5–4.6)
Potassium: 4.9 mmol/L (ref 3.5–5.1)
SODIUM: 130 mmol/L — AB (ref 135–145)
SODIUM: 130 mmol/L — AB (ref 135–145)

## 2016-08-30 LAB — APTT
APTT: 63 s — AB (ref 24–36)
APTT: 74 s — AB (ref 24–36)

## 2016-08-30 LAB — CBC
HCT: 32.2 % — ABNORMAL LOW (ref 36.0–46.0)
HEMOGLOBIN: 11.2 g/dL — AB (ref 12.0–15.0)
MCH: 28.1 pg (ref 26.0–34.0)
MCHC: 34.8 g/dL (ref 30.0–36.0)
MCV: 80.7 fL (ref 78.0–100.0)
Platelets: 110 10*3/uL — ABNORMAL LOW (ref 150–400)
RBC: 3.99 MIL/uL (ref 3.87–5.11)
RDW: 16.7 % — AB (ref 11.5–15.5)
WBC: 4.7 10*3/uL (ref 4.0–10.5)

## 2016-08-30 LAB — BASIC METABOLIC PANEL
Anion gap: 11 (ref 5–15)
BUN: 38 mg/dL — ABNORMAL HIGH (ref 6–20)
CHLORIDE: 97 mmol/L — AB (ref 101–111)
CO2: 23 mmol/L (ref 22–32)
Calcium: 8.5 mg/dL — ABNORMAL LOW (ref 8.9–10.3)
Creatinine, Ser: 2.78 mg/dL — ABNORMAL HIGH (ref 0.44–1.00)
GFR calc Af Amer: 20 mL/min — ABNORMAL LOW (ref >60)
GFR calc non Af Amer: 18 mL/min — ABNORMAL LOW (ref >60)
GLUCOSE: 119 mg/dL — AB (ref 65–99)
POTASSIUM: 4.8 mmol/L (ref 3.5–5.1)
SODIUM: 131 mmol/L — AB (ref 135–145)

## 2016-08-30 LAB — COOXEMETRY PANEL
Carboxyhemoglobin: 1.2 % (ref 0.5–1.5)
Methemoglobin: 1.2 % (ref 0.0–1.5)
O2 Saturation: 53.4 %
Total hemoglobin: 10.8 g/dL — ABNORMAL LOW (ref 12.0–16.0)

## 2016-08-30 LAB — MAGNESIUM: Magnesium: 2.5 mg/dL — ABNORMAL HIGH (ref 1.7–2.4)

## 2016-08-30 LAB — HEPARIN LEVEL (UNFRACTIONATED): HEPARIN UNFRACTIONATED: 1.94 [IU]/mL — AB (ref 0.30–0.70)

## 2016-08-30 NOTE — Progress Notes (Signed)
ANTICOAGULATION CONSULT NOTE - Initial Consult  Pharmacy Consult for heparin Indication: atrial fibrillation   Labs:  Recent Labs  08/27/16 0539 08/28/16 0422 08/29/16 0435 08/29/16 1558 08/29/16 2004 08/30/16 0335  HGB 11.9*  --  11.3*  --   --  11.2*  HCT 35.1*  --  33.4*  --   --  32.2*  PLT 100*  --  112*  --   --  110*  APTT  --   --   --   --  77* 63*  HEPARINUNFRC  --   --   --   --  >2.20*  --   CREATININE 2.98* 3.48* 4.50* 3.96*  --   --     Assessment: 56yo female now below goal on heparin after one PTT at goal; no gtt issues or signs of bleeding per RN.  Goal of Therapy:  aPTT 66-102 seconds   Plan:  Will increase heparin gtt by 1-2 units/kg/hr to 1000 units/hr and check PTT in 6hr.  Vernard GamblesVeronda Antar Milks, PharmD, BCPS  08/30/2016,4:46 AM

## 2016-08-30 NOTE — Progress Notes (Signed)
ANTICOAGULATION CONSULT NOTE - Follow Up Consult  Pharmacy Consult for Heparin Indication: atrial fibrillation  Allergies  Allergen Reactions  . Nyquil Multi-Symptom [Pseudoeph-Doxylamine-Dm-Apap] Other (See Comments)    Fatigue, chest pressure and pain Pt tolerates Coricidin cough syrup at home  . Sudafed [Pseudoephedrine] Other (See Comments)    Fatigue, chest pressure and pain  . Lasix [Furosemide]     Rash  . Torsemide     Rash  . Zocor [Simvastatin]     Myalgias   . Latex Rash  . Ramipril Cough    Patient Measurements: Height: 5\' 5"  (165.1 cm) Weight: 133 lb 6.4 oz (60.5 kg) IBW/kg (Calculated) : 57  Vital Signs: Temp: 97.6 F (36.4 C) (06/21 0800) Temp Source: Oral (06/21 0800) BP: 110/62 (06/21 1200) Pulse Rate: 64 (06/21 1200)  Labs:  Recent Labs  08/29/16 0435 08/29/16 1558 08/29/16 2004 08/30/16 0335 08/30/16 1215  HGB 11.3*  --   --  11.2*  --   HCT 33.4*  --   --  32.2*  --   PLT 112*  --   --  110*  --   APTT  --   --  77* 63* 74*  HEPARINUNFRC  --   --  >2.20* 1.94*  --   CREATININE 4.50* 3.96*  --  2.78*  2.82*  --     Estimated Creatinine Clearance: 19.8 mL/min (A) (by C-G formula based on SCr of 2.78 mg/dL (H)).  Medications: Heparin @ 1000 units/hr  Assessment: 56yof on eliquis for afib, s/p DCCV on 6/18, transitioned to IV heparin yesterday anticipating CVVHD. APTT is therapeutic at 74 seconds. Heparin level is elevated at 1.94 as eliquis still on board. CBC stable. No bleeding.  Goal of Therapy:  APTT 66-102 seconds Heparin level 0.3-0.7 units/ml Monitor platelets by anticoagulation protocol: Yes   Plan:  1) Continue heparin at 1000 units/hr 2) Daily heparin level, aPTT, CBC   Fredrik RiggerMarkle, Mahira Petras Sue 08/30/2016,12:53 PM

## 2016-08-30 NOTE — Plan of Care (Signed)
Problem: Pain Managment: Goal: General experience of comfort will improve Outcome: Progressing Pt is currently in no pain.  Problem: Skin Integrity: Goal: Risk for impaired skin integrity will decrease Outcome: Progressing Pt is able to turn herself in bed and does so frequently in order to prevent skin breakdown.  Problem: Activity: Goal: Risk for activity intolerance will decrease Outcome: Not Progressing Pt is on bedrest due to CRRT.

## 2016-08-30 NOTE — Progress Notes (Signed)
Advanced Heart Failure Rounding Note   Subjective:    Admitted with new onset atrial flutter and ADHF.  Milrinone started 6/15 for worsening HF and renal failure.  S/p TEE and successful DC-CV   Moved to ICU on 6/19 due to low output and worsening AKI. CVVHD catheter placed. Remains on CVVHD pulling 100/hr. On norepi and milrinone. CVP 18-19  Feels ok. No orthopnea or PND  Objective:   Weight Range:  Vital Signs:   Temp:  [97.4 F (36.3 C)-98.4 F (36.9 C)] 97.6 F (36.4 C) (06/21 0800) Pulse Rate:  [55-67] 66 (06/21 0800) Resp:  [11-30] 18 (06/21 0800) BP: (86-120)/(51-79) 106/61 (06/21 0800) SpO2:  [92 %-100 %] 94 % (06/21 0800) Weight:  [60.5 kg (133 lb 6.4 oz)] 60.5 kg (133 lb 6.4 oz) (06/21 0500) Last BM Date: 08/29/16  Weight change: Filed Weights   08/28/16 0300 08/29/16 0400 08/30/16 0500  Weight: 59.6 kg (131 lb 8 oz) 61.5 kg (135 lb 9.3 oz) 60.5 kg (133 lb 6.4 oz)    Intake/Output:   Intake/Output Summary (Last 24 hours) at 08/30/16 0811 Last data filed at 08/30/16 0800  Gross per 24 hour  Intake           1325.5 ml  Output             2938 ml  Net          -1612.5 ml     Physical Exam: CVP ~18-19 General: Lying in bed. Fatigued appearing.  HEENT: normal x for poor dentition Neck: supple JVP to ear . Cor: PMI nondisplaced. RRR 2/6 TR + RV lift Lungs: clear no wheezing Abdomen: soft NT/ND good BS Extremities: no cyanosis, clubbing, rash, trace edema  Femoral dialysis cath Neuro: alert & oriented x 3, cranial nerves grossly intact. moves all 4 extremities w/o difficulty. Affect pleasan   Telemetry: NSR 70s Personally reviewed    Labs: Basic Metabolic Panel:  Recent Labs Lab 08/25/16 0351  08/27/16 0539 08/28/16 0422 08/29/16 0435 08/29/16 1558 08/30/16 0335  NA 129*  < > 128* 131* 128* 131* 131*  130*  K 4.4  < > 4.1 4.9 6.0* 4.9 4.8  4.8  CL 94*  < > 94* 96* 95* 98* 97*  98*  CO2 24  < > 24 23 20* 21* 23  23  GLUCOSE 196*  <  > 191* 153* 201* 70 119*  117*  BUN 46*  < > 55* 64* 72* 62* 38*  39*  CREATININE 2.57*  < > 2.98* 3.48* 4.50* 3.96* 2.78*  2.82*  CALCIUM 8.7*  < > 8.8* 8.6* 8.9 8.9 8.5*  8.6*  MG 2.3  --   --  2.5*  --   --  2.5*  PHOS  --   --   --   --   --  3.2 2.4*  < > = values in this interval not displayed.  Liver Function Tests:  Recent Labs Lab 08/29/16 1558 08/30/16 0335  ALBUMIN 3.0* 3.1*   No results for input(s): LIPASE, AMYLASE in the last 168 hours. No results for input(s): AMMONIA in the last 168 hours.  CBC:  Recent Labs Lab 08/27/16 0539 08/29/16 0435 08/30/16 0335  WBC 4.1 5.3 4.7  NEUTROABS  --  3.5  --   HGB 11.9* 11.3* 11.2*  HCT 35.1* 33.4* 32.2*  MCV 81.4 81.5 80.7  PLT 100* 112* 110*    Cardiac Enzymes: No results for input(s): CKTOTAL, CKMB, CKMBINDEX, TROPONINI in  the last 168 hours.  BNP: BNP (last 3 results)  Recent Labs  09/28/15 1114  BNP 871.3*    ProBNP (last 3 results) No results for input(s): PROBNP in the last 8760 hours.    Other results:  Imaging: No results found.   Medications:     Scheduled Medications: . aspirin EC  81 mg Oral Daily  . insulin aspart  0-9 Units Subcutaneous Q4H  . insulin glargine  5 Units Subcutaneous Daily  . magnesium oxide  400 mg Oral Daily  . pravastatin  20 mg Oral q1800  . sildenafil  20 mg Oral TID  . sodium chloride flush  10-40 mL Intracatheter Q12H    Infusions: . amiodarone 30 mg/hr (08/30/16 0800)  . heparin 1,000 Units/hr (08/30/16 0800)  . milrinone 0.375 mcg/kg/min (08/30/16 0800)  . norepinephrine (LEVOPHED) Adult infusion 5 mcg/min (08/30/16 0800)  . dialysis replacement fluid (prismasate) 300 mL/hr at 08/30/16 0539  . dialysis replacement fluid (prismasate) 300 mL/hr at 08/30/16 0539  . dialysate (PRISMASATE) 1,000 mL/hr at 08/30/16 0400  . sodium chloride 999 mL/hr at 08/29/16 1307    PRN Medications: acetaminophen, albuterol, benzonatate,  chlorpheniramine-HYDROcodone, heparin, ondansetron (ZOFRAN) IV, sodium chloride, sodium chloride flush   Assessment/Plan/Discussion:   [name] is a 56 year old with h/o chronic systolic heart failure, ICM, Biotronik ICD, and PAH admitted today with new onset A Fib/Flutter  1. A flutter RVR New Onset- June 11 with device interrogation.  -S/P TEE DC-CV with conversion to NSR.  - Maintaining NSR. Continue amio gtt as we need to maintain NSR in hospital to maximize chance of recovery  - Eliquis off with ESRD. Now on heparin. Discussed with PharmD 2. A/C Systolic Biventricular Heart Failure- ICM. ECHO 10/2015 EF 20-25%. -> cardiogenic shock - echo 08/24/16. LVEF 10% severe RV HK (EF down further in setting of AFL) - Volume status remains markedly elevated - on dual inotropes. Co-ox remains marginal at 53% - Not a candidate for LVAD with RV failure and ESRD  - Continue milrinone 0.375 and levophed 5 mcg for now. Will titrate as needed to tolerate HD - No bb or ACE/ARB/ARNI with low output and AKI .  3. Acute on chronic renal failure (baseline CKD 3) due to cardio-renal syndrome - CVVHD started 6/20. Pull as tolerated - Not candidate for long-term HD 4. PAH - Continue sildenafil 20 mg TID for now.  5. CAD S/P multiple interventions.  - No S/S iscehmia.  - Continues asa.  No nitro with sildenafil.  - She was unable to take simvastatin due to myalgias. Continue pravastatin.  No change.  6. PAD  - occluded left SFA with mild/mod ABi. No revascularization was needed October 2017. No change.  7. H/O LV Thrombus - no thrombus on TEE. No change 8. Hyponatremia -sodium 131 this am.  Free H20 restrict.   9. Hyperkalemia - improved with HD  -Remains very tenuous with severe biventricualr HF and progressive AKI. Hopefully will improve with restoration of NSR and volume removal with CVVHD but may be end-stage.   CRITICAL CARE Performed by: Arvilla MeresBensimhon, Aldrich Lloyd  Total critical care time: 35  minutes  Critical care time was exclusive of separately billable procedures and treating other patients.  Critical care was necessary to treat or prevent imminent or life-threatening deterioration.  Critical care was time spent personally by me (independent of midlevel providers or residents) on the following activities: development of treatment plan with patient and/or surrogate as well as nursing, discussions with consultants,  evaluation of patient's response to treatment, examination of patient, obtaining history from patient or surrogate, ordering and performing treatments and interventions, ordering and review of laboratory studies, ordering and review of radiographic studies, pulse oximetry and re-evaluation of patient's condition.   Length of Stay: 8  Arvilla Meres, MD 08/30/2016, 8:11 AM  Advanced Heart Failure Team Pager [phone] (M-F; 7a - 4p)  Please contact [facility] for night-coverage after hours (4p -7a ) and weekends on [email or web address]

## 2016-08-30 NOTE — Progress Notes (Signed)
[facility] Progress Note    Assessment/ Plan:   1.  Acute oliguric kidney injury: secondary to decompensated HF.  I think that a trial of dialysis is reasonable.  Hopefully we can get her back to euvolemia.  There is a possibility that she may go home on home inotropes; if so then long term dialysis is off the table (and to be frank she's not a good candidate anyway).  Continue CRRT today.  2.  Acute decompensated HF/ cardiogenic shock: on milrinone and levophed.  CVPs improving with volume removal  3.  New Aflutter: s/p successful DCCV.  In NSR.  4.  PAH: on sildenafil 20 mg TID.  5.  Aflutter/ h/o LV thrombus: on Eliquis.  Would dose-reduce to 2.5 mg BID for now.    Subjective:    CRRT started yesterday, tolerating well.     Objective:   BP (!) 88/56   Pulse 68   Temp 97.6 F (36.4 C) (Oral)   Resp (!) 25   Ht 5\' 5"  (1.651 m)   Wt 60.5 kg (133 lb 6.4 oz)   SpO2 97%   BMI 22.20 kg/m   Intake/Output Summary (Last 24 hours) at 08/30/16 1411 Last data filed at 08/30/16 1400  Gross per 24 hour  Intake           998.23 ml  Output             3806 ml  Net         -2807.77 ml   Weight change: -0.99 kg (-2 lb 2.9 oz)  Physical Exam: GEN thin female, SOB improved HEENT EOMI, PERRL NECK JVD improving PULM bilateral crackles in the bases CV RRR LV heave, + S3 ABD distended, hypoactive bowel sounds EXT slightly cool, 1+ LE edema NEURO nonfocal SKIN no lesions MSK no synovitis  Imaging: No results found.  Labs: BMET  Recent Labs Lab 08/26/16 0449 08/27/16 0428 08/27/16 0539 08/28/16 0422 08/29/16 0435 08/29/16 1558 08/30/16 0335  NA 130* QUESTIONABLE RESULTS, RECOMMEND RECOLLECT TO VERIFY 128* 131* 128* 131* 131*  130*  K 4.1 QUESTIONABLE RESULTS, RECOMMEND RECOLLECT TO VERIFY 4.1 4.9 6.0* 4.9 4.8  4.8  CL 95* QUESTIONABLE RESULTS, RECOMMEND RECOLLECT TO VERIFY 94* 96* 95* 98* 97*  98*  CO2 24 QUESTIONABLE RESULTS, RECOMMEND RECOLLECT  TO VERIFY 24 23 20* 21* 23  23  GLUCOSE 163* QUESTIONABLE RESULTS, RECOMMEND RECOLLECT TO VERIFY 191* 153* 201* 70 119*  117*  BUN 50* QUESTIONABLE RESULTS, RECOMMEND RECOLLECT TO VERIFY 55* 64* 72* 62* 38*  39*  CREATININE 2.72* QUESTIONABLE RESULTS, RECOMMEND RECOLLECT TO VERIFY 2.98* 3.48* 4.50* 3.96* 2.78*  2.82*  CALCIUM 8.8* QUESTIONABLE RESULTS, RECOMMEND RECOLLECT TO VERIFY 8.8* 8.6* 8.9 8.9 8.5*  8.6*  PHOS  --   --   --   --   --  3.2 2.4*   CBC  Recent Labs Lab 08/27/16 0539 08/29/16 0435 08/30/16 0335  WBC 4.1 5.3 4.7  NEUTROABS  --  3.5  --   HGB 11.9* 11.3* 11.2*  HCT 35.1* 33.4* 32.2*  MCV 81.4 81.5 80.7  PLT 100* 112* 110*    Medications:    . aspirin EC  81 mg Oral Daily  . insulin aspart  0-9 Units Subcutaneous Q4H  . insulin glargine  5 Units Subcutaneous Daily  . magnesium oxide  400 mg Oral Daily  . pravastatin  20 mg Oral q1800  . sildenafil  20 mg Oral TID  . sodium chloride flush  10-40 mL Intracatheter Q12H      Bufford Buttner, MD [facility] pgr [phone] 08/30/2016, 2:11 PM

## 2016-08-30 NOTE — Procedures (Signed)
Patient seen and examined on CRRT.  Tolerating well.   QB 250 mL/ min per R fem nontunneled catheter.  UF goal 100 mL/ hr.  Will continue vol removal.  Treatment adjusted as needed.  Bufford ButtnerElizabeth Micheal Murad MD  [facility] pgr [phone] 2:13 PM

## 2016-08-31 LAB — RENAL FUNCTION PANEL
ALBUMIN: 3.2 g/dL — AB (ref 3.5–5.0)
ALBUMIN: 3.1 g/dL — AB (ref 3.5–5.0)
ANION GAP: 9 (ref 5–15)
ANION GAP: 6 (ref 5–15)
BUN: 17 mg/dL (ref 6–20)
BUN: 15 mg/dL (ref 6–20)
CALCIUM: 8.5 mg/dL — AB (ref 8.9–10.3)
CHLORIDE: 99 mmol/L — AB (ref 101–111)
CO2: 26 mmol/L (ref 22–32)
CO2: 29 mmol/L (ref 22–32)
CREATININE: 1.73 mg/dL — AB (ref 0.44–1.00)
Calcium: 8.7 mg/dL — ABNORMAL LOW (ref 8.9–10.3)
Chloride: 98 mmol/L — ABNORMAL LOW (ref 101–111)
Creatinine, Ser: 1.74 mg/dL — ABNORMAL HIGH (ref 0.44–1.00)
GFR calc Af Amer: 36 mL/min — ABNORMAL LOW (ref >60)
GFR, EST AFRICAN AMERICAN: 36 mL/min — AB (ref >60)
GFR, EST NON AFRICAN AMERICAN: 31 mL/min — AB (ref >60)
GFR, EST NON AFRICAN AMERICAN: 31 mL/min — AB (ref >60)
Glucose, Bld: 95 mg/dL (ref 65–99)
Glucose, Bld: 185 mg/dL — ABNORMAL HIGH (ref 65–99)
PHOSPHORUS: 1.9 mg/dL — AB (ref 2.5–4.6)
PHOSPHORUS: 1.8 mg/dL — AB (ref 2.5–4.6)
POTASSIUM: 4.6 mmol/L (ref 3.5–5.1)
Potassium: 4.6 mmol/L (ref 3.5–5.1)
SODIUM: 133 mmol/L — AB (ref 135–145)
Sodium: 134 mmol/L — ABNORMAL LOW (ref 135–145)

## 2016-08-31 LAB — MAGNESIUM: MAGNESIUM: 2.6 mg/dL — AB (ref 1.7–2.4)

## 2016-08-31 LAB — CBC
HCT: 33.5 % — ABNORMAL LOW (ref 36.0–46.0)
HEMOGLOBIN: 11.3 g/dL — AB (ref 12.0–15.0)
MCH: 27.8 pg (ref 26.0–34.0)
MCHC: 33.7 g/dL (ref 30.0–36.0)
MCV: 82.3 fL (ref 78.0–100.0)
Platelets: 128 10*3/uL — ABNORMAL LOW (ref 150–400)
RBC: 4.07 MIL/uL (ref 3.87–5.11)
RDW: 16.9 % — ABNORMAL HIGH (ref 11.5–15.5)
WBC: 5.1 10*3/uL (ref 4.0–10.5)

## 2016-08-31 LAB — COOXEMETRY PANEL
CARBOXYHEMOGLOBIN: 1.9 % — AB (ref 0.5–1.5)
Methemoglobin: 0.8 % (ref 0.0–1.5)
O2 SAT: 69.6 %
Total hemoglobin: 11.2 g/dL — ABNORMAL LOW (ref 12.0–16.0)

## 2016-08-31 LAB — GLUCOSE, CAPILLARY
GLUCOSE-CAPILLARY: 156 mg/dL — AB (ref 65–99)
GLUCOSE-CAPILLARY: 176 mg/dL — AB (ref 65–99)
GLUCOSE-CAPILLARY: 231 mg/dL — AB (ref 65–99)
GLUCOSE-CAPILLARY: 94 mg/dL (ref 65–99)
Glucose-Capillary: 135 mg/dL — ABNORMAL HIGH (ref 65–99)
Glucose-Capillary: 125 mg/dL — ABNORMAL HIGH (ref 65–99)

## 2016-08-31 LAB — MRSA PCR SCREENING: MRSA by PCR: NEGATIVE

## 2016-08-31 LAB — APTT: aPTT: 74 seconds — ABNORMAL HIGH (ref 24–36)

## 2016-08-31 LAB — HEPARIN LEVEL (UNFRACTIONATED): HEPARIN UNFRACTIONATED: 1.12 [IU]/mL — AB (ref 0.30–0.70)

## 2016-08-31 MED ORDER — INSULIN ASPART 100 UNIT/ML ~~LOC~~ SOLN
0.0000 [IU] | Freq: Three times a day (TID) | SUBCUTANEOUS | Status: DC
  Administered 2016-08-31: 2 [IU] via SUBCUTANEOUS
  Administered 2016-08-31: 1 [IU] via SUBCUTANEOUS
  Administered 2016-08-31: 3 [IU] via SUBCUTANEOUS
  Administered 2016-09-01: 2 [IU] via SUBCUTANEOUS
  Administered 2016-09-01: 3 [IU] via SUBCUTANEOUS
  Administered 2016-09-01: 1 [IU] via SUBCUTANEOUS
  Administered 2016-09-02: 2 [IU] via SUBCUTANEOUS
  Administered 2016-09-02: 1 [IU] via SUBCUTANEOUS
  Administered 2016-09-02: 2 [IU] via SUBCUTANEOUS
  Administered 2016-09-03: 3 [IU] via SUBCUTANEOUS
  Administered 2016-09-03: 2 [IU] via SUBCUTANEOUS
  Administered 2016-09-03: 1 [IU] via SUBCUTANEOUS
  Administered 2016-09-04: 2 [IU] via SUBCUTANEOUS

## 2016-08-31 NOTE — Procedures (Signed)
Patient seen and examined on CRRT.  Tolerating well.  No filter issues.  QB 250 mL/ min per R fem nontunneled catheter.  UF goal 100 mL/ hr.  Will increase volume removal to 150 mL/ hr.    Treatment adjusted as needed.  Bufford ButtnerElizabeth Gerre Ranum MD  [facility] pgr [phone] 9:52 AM

## 2016-08-31 NOTE — Progress Notes (Signed)
Advanced Heart Failure Rounding Note   Subjective:    Admitted with new onset atrial flutter and ADHF. Milrinone started 6/15 for worsening HF and renal failure.  S/P TEE and successful DC-CV   Moved to ICU on 6/19 due to low output and worsening AKI. CVVHD catheter placed. Remains on CVVHD pulling 100/hr.   Remains norepi 5 mcg + milrinone 0.375 mcg. Todays 70%. Remains anuric. Overnight sats dropped to the low 80s. Oxygen increased to 6 liters.   Says she tired of being in bed. Intermittent dyspnea. Wants to shower.     Objective:   Weight Range:  Vital Signs:   Temp:  [97.6 F (36.4 C)-98.5 F (36.9 C)] 98.5 F (36.9 C) (06/22 0400) Pulse Rate:  [48-120] 65 (06/22 0700) Resp:  [13-33] 19 (06/22 0700) BP: (85-129)/(49-83) 99/56 (06/22 0700) SpO2:  [87 %-100 %] 97 % (06/22 0700) Weight:  [129 lb 3.2 oz (58.6 kg)] 129 lb 3.2 oz (58.6 kg) (06/22 0500) Last BM Date: 08/22/16  Weight change: Filed Weights   08/29/16 0400 08/30/16 0500 08/31/16 0500  Weight: 135 lb 9.3 oz (61.5 kg) 133 lb 6.4 oz (60.5 kg) 129 lb 3.2 oz (58.6 kg)    Intake/Output:   Intake/Output Summary (Last 24 hours) at 08/31/16 0709 Last data filed at 08/31/16 0700  Gross per 24 hour  Intake          2010.93 ml  Output             3963 ml  Net         -1952.07 ml     Physical Exam: CVP 20 General: Appears fatigued. No resp difficulty HEENT: normal Neck: supple. JVP to jaw. Carotids 2+ bilat; no bruits. No lymphadenopathy or thryomegaly appreciated. Cor: PMI nondisplaced. Regular rate & rhythm. No rubs, gallops or murmurs.  Lungs: Decreased in the bases on 6 liters [place] Abdomen: soft, nontender, nondistended. No hepatosplenomegaly. No bruits or masses. Good bowel sounds. Extremities: no cyanosis, clubbing, rash, edema. R femoral HD cath. RUE PICC Neuro: alert & orientedx3, cranial nerves grossly intact. moves all 4 extremities w/o difficulty. Affect pleasant   Telemetry: NSR 60s. Personally  reviewed.     Labs: Basic Metabolic Panel:  Recent Labs Lab 08/25/16 0351  08/28/16 0422 08/29/16 0435 08/29/16 1558 08/30/16 0335 08/30/16 1605 08/31/16 0500  NA 129*  < > 131* 128* 131* 131*  130* 130* 134*  K 4.4  < > 4.9 6.0* 4.9 4.8  4.8 4.9 4.6  CL 94*  < > 96* 95* 98* 97*  98* 96* 99*  CO2 24  < > 23 20* 21* 23  23 25 26   GLUCOSE 196*  < > 153* 201* 70 119*  117* 290* 95  BUN 46*  < > 64* 72* 62* 38*  39* 28* 17  CREATININE 2.57*  < > 3.48* 4.50* 3.96* 2.78*  2.82* 2.26* 1.74*  CALCIUM 8.7*  < > 8.6* 8.9 8.9 8.5*  8.6* 8.3* 8.7*  MG 2.3  --  2.5*  --   --  2.5*  --  2.6*  PHOS  --   --   --   --  3.2 2.4* 2.5 1.9*  < > = values in this interval not displayed.  Liver Function Tests:  Recent Labs Lab 08/29/16 1558 08/30/16 0335 08/30/16 1605 08/31/16 0500  ALBUMIN 3.0* 3.1* 2.9* 3.2*   No results for input(s): LIPASE, AMYLASE in the last 168 hours. No results for input(s): AMMONIA in  the last 168 hours.  CBC:  Recent Labs Lab 08/27/16 0539 08/29/16 0435 08/30/16 0335 08/31/16 0500  WBC 4.1 5.3 4.7 5.1  NEUTROABS  --  3.5  --   --   HGB 11.9* 11.3* 11.2* 11.3*  HCT 35.1* 33.4* 32.2* 33.5*  MCV 81.4 81.5 80.7 82.3  PLT 100* 112* 110* 128*    Cardiac Enzymes: No results for input(s): CKTOTAL, CKMB, CKMBINDEX, TROPONINI in the last 168 hours.  BNP: BNP (last 3 results)  Recent Labs  09/28/15 1114  BNP 871.3*    ProBNP (last 3 results) No results for input(s): PROBNP in the last 8760 hours.    Other results:  Imaging: No results found.   Medications:     Scheduled Medications: . aspirin EC  81 mg Oral Daily  . insulin aspart  0-9 Units Subcutaneous Q4H  . insulin glargine  5 Units Subcutaneous Daily  . magnesium oxide  400 mg Oral Daily  . pravastatin  20 mg Oral q1800  . sildenafil  20 mg Oral TID  . sodium chloride flush  10-40 mL Intracatheter Q12H    Infusions: . amiodarone 30 mg/hr (08/31/16 0700)  . heparin  1,000 Units/hr (08/31/16 0700)  . milrinone 0.375 mcg/kg/min (08/31/16 0700)  . norepinephrine (LEVOPHED) Adult infusion 5 mcg/min (08/31/16 0700)  . dialysis replacement fluid (prismasate) 300 mL/hr at 08/30/16 2246  . dialysis replacement fluid (prismasate) 300 mL/hr at 08/30/16 2302  . dialysate (PRISMASATE) 1,000 mL/hr at 08/31/16 0547  . sodium chloride 999 mL/hr at 08/29/16 1307    PRN Medications: acetaminophen, albuterol, benzonatate, chlorpheniramine-HYDROcodone, heparin, ondansetron (ZOFRAN) IV, sodium chloride, sodium chloride flush   Assessment/Plan/Discussion:   Jessica [name] is a 56 year old with h/o chronic systolic heart failure, ICM, Biotronik ICD, and PAH admitted today with new onset A Fib/Flutter  1. A flutter RVR New Onset- June 11 with device interrogation.  -S/P TEE DC-CV with conversion to NSR.  - Maintaining NSR. Continue amio drip.  - Continue heparin. Discussed with Pharm D 2. A/C Systolic Biventricular Heart Failure- ICM. ECHO 10/2015 EF 20-25%. -> cardiogenic shock - echo 08/24/16. LVEF 10% severe RV HK (EF down further in setting of AFL) - Remains on dual inotropes. Norepi + milrinone 0.375 mcg + norepi 5 mcg.  Today's Co-ox 70% on dual inotropes. Co-ox remains marginal at 53%.  - No bb or ACE/ARB/ARNI with low output and AKI .  3. Acute on chronic renal failure (baseline CKD 3) due to cardio-renal syndrome - CVVHD started 6/20. Continue to pull as tolerated. CVP 20.  Not a candidate for long term HD.  4. PAH - Continue sildenafil 20 mg TID for now.  5. CAD S/P multiple interventions.  - No S/S ischemia  - Continues asa.  No nitro with sildenafil.  - She was unable to take simvastatin due to myalgias. Continue pravastatin.  No change.  6. PAD  - occluded left SFA with mild/mod ABi. No revascularization was needed October 2017. No change.  7. H/O LV Thrombus - no thrombus on TEE. No change 8. Hyponatremia -Sodium up to 134. .   9. Hyperkalemia - K 4.6.  Improved with HD. improved with HD  Jessica Clegg NP-C  7:10 AM   Patient seen with NP, agree with the above note.    She remains in NSR on amiodarone.  Would continue IV infusion while she is on milrinone and norepinephrine.   She is markedly volume overloaded with CVP 20.  Will continue current milrinone  and norepinephrine and will need ongoing CVVH (currently pulling 100 cc/hr).   Jessica [name] 08/31/2016 7:34 AM

## 2016-08-31 NOTE — Progress Notes (Signed)
ANTICOAGULATION CONSULT NOTE - Follow Up Consult  Pharmacy Consult for Heparin Indication: atrial fibrillation  Allergies  Allergen Reactions  . Nyquil Multi-Symptom [Pseudoeph-Doxylamine-Dm-Apap] Other (See Comments)    Fatigue, chest pressure and pain Pt tolerates Coricidin cough syrup at home  . Sudafed [Pseudoephedrine] Other (See Comments)    Fatigue, chest pressure and pain  . Lasix [Furosemide]     Rash  . Torsemide     Rash  . Zocor [Simvastatin]     Myalgias   . Latex Rash  . Ramipril Cough    Patient Measurements: Height: 5\' 5"  (165.1 cm) Weight: 129 lb 3.2 oz (58.6 kg) IBW/kg (Calculated) : 57  Vital Signs: Temp: 98.5 F (36.9 C) (06/22 0400) Temp Source: Oral (06/22 0400) BP: 102/60 (06/22 1015) Pulse Rate: 61 (06/22 1015)  Labs:  Recent Labs  08/29/16 0435  08/29/16 2004 08/30/16 0335 08/30/16 1215 08/30/16 1605 08/31/16 0500  HGB 11.3*  --   --  11.2*  --   --  11.3*  HCT 33.4*  --   --  32.2*  --   --  33.5*  PLT 112*  --   --  110*  --   --  128*  APTT  --   < > 77* 63* 74*  --  74*  HEPARINUNFRC  --   --  >2.20* 1.94*  --   --  1.12*  CREATININE 4.50*  < >  --  2.78*  2.82*  --  2.26* 1.74*  < > = values in this interval not displayed.  Estimated Creatinine Clearance: 31.7 mL/min (A) (by C-G formula based on SCr of 1.74 mg/dL (H)).  Medications: Heparin @ 1000 units/hr  Assessment: 56yof on eliquis for afib, s/p DCCV on 6/18, transitioned to IV heparin 6/20 anticipating CVVHD. APTT is therapeutic at 74 seconds. Heparin level is elevated but trending down to 1.21, eliquis still on board but starting to clear. CBC stable. No bleeding.  Goal of Therapy:  APTT 66-102 seconds Heparin level 0.3-0.7 units/ml Monitor platelets by anticoagulation protocol: Yes   Plan:  1) Continue heparin at 1000 units/hr 2) Daily heparin level, aPTT, CBC   Fredrik RiggerMarkle, Bence Trapp Sue 08/31/2016,10:25 AM

## 2016-08-31 NOTE — Progress Notes (Signed)
[facility] Progress Note    Assessment/ Plan:   1.  Acute oliguric kidney injury: secondary to decompensated HF.  Hopefully we can get her back to euvolemia.  There is a possibility that she may go home on home inotropes; if so then long term dialysis is off the table (and to be frank she's not a good candidate anyway).  Continue CRRT today; CVPs still high at 20, increase to 150 mL/ hr and if tolerates well consider up to 200 mL/ hr.  2.  Acute decompensated HF/ cardiogenic shock: on milrinone and levophed.    3.  New Aflutter: s/p successful DCCV.  In NSR.  4.  PAH: on sildenafil 20 mg TID.  5.  Aflutter/ h/o LV thrombus: on Eliquis as outpatient; on systemic heparin.    Subjective:    PT continues on CRRT without issue.  Tolerating 100 mL/ hr.  CVP this AM 20.  She has no complaints   Objective:   BP 113/63   Pulse 67   Temp 98.5 F (36.9 C) (Oral)   Resp (!) 21   Ht 5\' 5"  (1.651 m)   Wt 58.6 kg (129 lb 3.2 oz)   SpO2 (!) 86%   BMI 21.50 kg/m   Intake/Output Summary (Last 24 hours) at 08/31/16 0949 Last data filed at 08/31/16 0945  Gross per 24 hour  Intake          2123.03 ml  Output             4011 ml  Net         -1887.97 ml   Weight change: -1.905 kg (-4 lb 3.2 oz)  Physical Exam: GEN thin female, SOB improved HEENT EOMI, PERRL NECK JVD improving PULM bilateral crackles in the bases CV RRR LV heave, + S3 ABD distended, hypoactive bowel sounds EXT slightly cool, 1+ LE edema NEURO nonfocal SKIN no lesions MSK no synovitis  Imaging: No results found.  Labs: BMET  Recent Labs Lab 08/27/16 0539 08/28/16 0422 08/29/16 0435 08/29/16 1558 08/30/16 0335 08/30/16 1605 08/31/16 0500  NA 128* 131* 128* 131* 131*  130* 130* 134*  K 4.1 4.9 6.0* 4.9 4.8  4.8 4.9 4.6  CL 94* 96* 95* 98* 97*  98* 96* 99*  CO2 24 23 20* 21* 23  23 25 26   GLUCOSE 191* 153* 201* 70 119*  117* 290* 95  BUN 55* 64* 72* 62* 38*  39* 28* 17   CREATININE 2.98* 3.48* 4.50* 3.96* 2.78*  2.82* 2.26* 1.74*  CALCIUM 8.8* 8.6* 8.9 8.9 8.5*  8.6* 8.3* 8.7*  PHOS  --   --   --  3.2 2.4* 2.5 1.9*   CBC  Recent Labs Lab 08/27/16 0539 08/29/16 0435 08/30/16 0335 08/31/16 0500  WBC 4.1 5.3 4.7 5.1  NEUTROABS  --  3.5  --   --   HGB 11.9* 11.3* 11.2* 11.3*  HCT 35.1* 33.4* 32.2* 33.5*  MCV 81.4 81.5 80.7 82.3  PLT 100* 112* 110* 128*    Medications:    . aspirin EC  81 mg Oral Daily  . insulin aspart  0-9 Units Subcutaneous TID WC  . insulin glargine  5 Units Subcutaneous Daily  . magnesium oxide  400 mg Oral Daily  . pravastatin  20 mg Oral q1800  . sildenafil  20 mg Oral TID  . sodium chloride flush  10-40 mL Intracatheter Q12H      Bufford Buttner, MD [facility] pgr [phone]  08/31/2016, 9:49 AM

## 2016-09-01 LAB — GLUCOSE, CAPILLARY
GLUCOSE-CAPILLARY: 140 mg/dL — AB (ref 65–99)
GLUCOSE-CAPILLARY: 183 mg/dL — AB (ref 65–99)
GLUCOSE-CAPILLARY: 207 mg/dL — AB (ref 65–99)
Glucose-Capillary: 178 mg/dL — ABNORMAL HIGH (ref 65–99)
Glucose-Capillary: 189 mg/dL — ABNORMAL HIGH (ref 65–99)

## 2016-09-01 LAB — RENAL FUNCTION PANEL
ANION GAP: 8 (ref 5–15)
ANION GAP: 7 (ref 5–15)
Albumin: 3 g/dL — ABNORMAL LOW (ref 3.5–5.0)
Albumin: 3.1 g/dL — ABNORMAL LOW (ref 3.5–5.0)
BUN: 15 mg/dL (ref 6–20)
BUN: 11 mg/dL (ref 6–20)
CHLORIDE: 97 mmol/L — AB (ref 101–111)
CO2: 27 mmol/L (ref 22–32)
CO2: 26 mmol/L (ref 22–32)
CREATININE: 1.66 mg/dL — AB (ref 0.44–1.00)
Calcium: 8.7 mg/dL — ABNORMAL LOW (ref 8.9–10.3)
Calcium: 8.7 mg/dL — ABNORMAL LOW (ref 8.9–10.3)
Chloride: 97 mmol/L — ABNORMAL LOW (ref 101–111)
Creatinine, Ser: 1.67 mg/dL — ABNORMAL HIGH (ref 0.44–1.00)
GFR calc Af Amer: 38 mL/min — ABNORMAL LOW (ref >60)
GFR calc non Af Amer: 33 mL/min — ABNORMAL LOW (ref >60)
GFR, EST AFRICAN AMERICAN: 38 mL/min — AB (ref >60)
GFR, EST NON AFRICAN AMERICAN: 33 mL/min — AB (ref >60)
GLUCOSE: 177 mg/dL — AB (ref 65–99)
Glucose, Bld: 173 mg/dL — ABNORMAL HIGH (ref 65–99)
POTASSIUM: 4.4 mmol/L (ref 3.5–5.1)
POTASSIUM: 4.3 mmol/L (ref 3.5–5.1)
Phosphorus: 1.9 mg/dL — ABNORMAL LOW (ref 2.5–4.6)
Phosphorus: 1.7 mg/dL — ABNORMAL LOW (ref 2.5–4.6)
SODIUM: 132 mmol/L — AB (ref 135–145)
Sodium: 130 mmol/L — ABNORMAL LOW (ref 135–145)

## 2016-09-01 LAB — HEPARIN LEVEL (UNFRACTIONATED): Heparin Unfractionated: 0.6 IU/mL (ref 0.30–0.70)

## 2016-09-01 LAB — COOXEMETRY PANEL
Carboxyhemoglobin: 1.7 % — ABNORMAL HIGH (ref 0.5–1.5)
Methemoglobin: 1.3 % (ref 0.0–1.5)
O2 Saturation: 72.1 %
TOTAL HEMOGLOBIN: 10.8 g/dL — AB (ref 12.0–16.0)

## 2016-09-01 LAB — APTT: aPTT: 67 seconds — ABNORMAL HIGH (ref 24–36)

## 2016-09-01 LAB — CBC
HCT: 31.1 % — ABNORMAL LOW (ref 36.0–46.0)
Hemoglobin: 10.4 g/dL — ABNORMAL LOW (ref 12.0–15.0)
MCH: 27.8 pg (ref 26.0–34.0)
MCHC: 33.4 g/dL (ref 30.0–36.0)
MCV: 83.2 fL (ref 78.0–100.0)
PLATELETS: 126 10*3/uL — AB (ref 150–400)
RBC: 3.74 MIL/uL — AB (ref 3.87–5.11)
RDW: 17 % — ABNORMAL HIGH (ref 11.5–15.5)
WBC: 5.7 10*3/uL (ref 4.0–10.5)

## 2016-09-01 LAB — MAGNESIUM: Magnesium: 2.4 mg/dL (ref 1.7–2.4)

## 2016-09-01 MED ORDER — CHLORHEXIDINE GLUCONATE CLOTH 2 % EX PADS
6.0000 | MEDICATED_PAD | Freq: Every day | CUTANEOUS | Status: DC
  Administered 2016-09-01 – 2016-09-02 (×3): 6 via TOPICAL

## 2016-09-01 NOTE — Progress Notes (Addendum)
ANTICOAGULATION CONSULT NOTE - Follow Up Consult  Pharmacy Consult for Heparin Indication: atrial fibrillation  Allergies  Allergen Reactions  . Nyquil Multi-Symptom [Pseudoeph-Doxylamine-Dm-Apap] Other (See Comments)    Fatigue, chest pressure and pain Pt tolerates Coricidin cough syrup at home  . Sudafed [Pseudoephedrine] Other (See Comments)    Fatigue, chest pressure and pain  . Lasix [Furosemide]     Rash  . Torsemide     Rash  . Zocor [Simvastatin]     Myalgias   . Latex Rash  . Ramipril Cough    Patient Measurements: Height: 5\' 5"  (165.1 cm) Weight: 123 lb 10.9 oz (56.1 kg) IBW/kg (Calculated) : 57  Vital Signs: Temp: 98.5 F (36.9 C) (06/23 0734) Temp Source: Oral (06/23 0734) BP: 110/53 (06/23 1000) Pulse Rate: 59 (06/23 1000)  Labs:  Recent Labs  08/29/16 2004  08/30/16 0335 08/30/16 1215  08/31/16 0500 08/31/16 1630 09/01/16 0454 09/01/16 0529  HGB  --   < > 11.2*  --   --  11.3*  --   --  10.4*  HCT  --   --  32.2*  --   --  33.5*  --   --  31.1*  PLT  --   --  110*  --   --  128*  --   --  126*  APTT 77*  --  63* 74*  --  74*  --   --  67*  HEPARINUNFRC >2.20*  --  1.94*  --   --  1.12*  --   --   --   CREATININE  --   --  2.78*  2.82*  --   < > 1.74* 1.73* 1.67*  --   < > = values in this interval not displayed.  Estimated Creatinine Clearance: 32.5 mL/min (A) (by C-G formula based on SCr of 1.67 mg/dL (H)).  Medications: Heparin @ 1000 units/hr  Assessment: 56yof on eliquis for afib, s/p DCCV on 6/18, transitioned to IV heparin 6/20 anticipating CVVHD. APTT is therapeutic at 67 seconds. No heparin level today (lab ordered but not drawn). CBC stable. No bleeding.  Goal of Therapy:  APTT 66-102 seconds Heparin level 0.3-0.7 units/ml Monitor platelets by anticoagulation protocol: Yes   Plan:  1) Continue heparin at 1000 units/hr 2) Daily heparin level, aPTT, CBC   Javin Nong, Hessie DienerJennifer Sue 09/01/2016,10:51 AM    ADDENDUM: Heparin  level came back at 0.60 which is therapeutic and starting to correlate with aPTT. Will check both heparin level and aPTT for one more day and then can probably just use heparin levels.  Fredrik RiggerMarkle, Monna Crean Sue 09/01/2016, 4:44 PM

## 2016-09-01 NOTE — Progress Notes (Signed)
  [facility] Progress Note    Assessment/ Plan:   1.  Acute oliguric kidney injury: secondary to decompensated HF.  Hopefully we can get her back to euvolemia.  There is a possibility that she may go home on home inotropes; if so then long term dialysis is off the table (and to be frank she's not a good candidate anyway).  Continue CRRT today; CVP improving to 14 just now, continue at 150 mL/ hr; she's net negative 3.2L yesterday.    2.  Acute decompensated HF/ cardiogenic shock: on milrinone and levophed.   3.  New Aflutter: s/p successful DCCV.  In NSR.  4.  PAH: on sildenafil 20 mg TID.  5. h/o LV thrombus: on Eliquis as outpatient; on systemic heparin.    Subjective:    PT continues on CRRT without issue.  Tolerating 150 mL/ hr.    Objective:   BP (!) 88/61   Pulse (!) 57   Temp 98.5 F (36.9 C) (Oral)   Resp (!) 22   Ht 5\' 5"  (1.651 m)   Wt 56.1 kg (123 lb 10.9 oz)   SpO2 97%   BMI 20.58 kg/m   Intake/Output Summary (Last 24 hours) at 09/01/16 1340 Last data filed at 09/01/16 1300  Gross per 24 hour  Intake           1155.9 ml  Output             4956 ml  Net          -3800.1 ml   Weight change: -2.505 kg (-5 lb 8.4 oz)  Physical Exam: GEN thin female, SOB improved, appears comfortable today and eating lunch HEENT EOMI, PERRL NECK JVD improving PULM clear anteriorly CV RRR LV heave improved ABD distended, hypoactive bowel sounds EXT warm today, trace LE edema NEURO nonfocal SKIN no lesions MSK no synovitis  Imaging: No results found.  Labs: BMET  Recent Labs Lab 08/29/16 0435 08/29/16 1558 08/30/16 0335 08/30/16 1605 08/31/16 0500 08/31/16 1630 09/01/16 0454  NA 128* 131* 131*  130* 130* 134* 133* 132*  K 6.0* 4.9 4.8  4.8 4.9 4.6 4.6 4.4  CL 95* 98* 97*  98* 96* 99* 98* 97*  CO2 20* 21* 23  23 25 26 29 27   GLUCOSE 201* 70 119*  117* 290* 95 185* 177*  BUN 72* 62* 38*  39* 28* 17 15 15   CREATININE 4.50* 3.96*  2.78*  2.82* 2.26* 1.74* 1.73* 1.67*  CALCIUM 8.9 8.9 8.5*  8.6* 8.3* 8.7* 8.5* 8.7*  PHOS  --  3.2 2.4* 2.5 1.9* 1.8* 1.9*   CBC  Recent Labs Lab 08/29/16 0435 08/30/16 0335 08/31/16 0500 09/01/16 0529  WBC 5.3 4.7 5.1 5.7  NEUTROABS 3.5  --   --   --   HGB 11.3* 11.2* 11.3* 10.4*  HCT 33.4* 32.2* 33.5* 31.1*  MCV 81.5 80.7 82.3 83.2  PLT 112* 110* 128* 126*    Medications:    . aspirin EC  81 mg Oral Daily  . insulin aspart  0-9 Units Subcutaneous TID WC  . insulin glargine  5 Units Subcutaneous Daily  . magnesium oxide  400 mg Oral Daily  . pravastatin  20 mg Oral q1800  . sildenafil  20 mg Oral TID  . sodium chloride flush  10-40 mL Intracatheter Q12H      Bufford ButtnerElizabeth Maitri Schnoebelen, MD [facility] pgr [phone] 09/01/2016, 1:40 PM

## 2016-09-01 NOTE — Progress Notes (Signed)
Patient ID: Jessica MoccasinSherita [name], female   DOB: Jan 09, 1961, 56 y.o.   MRN: [id number]   Advanced Heart Failure Rounding Note   Subjective:    Admitted with new onset atrial flutter and ADHF. Milrinone started 6/15 for worsening HF and renal failure.  S/P TEE and successful DC-CV.   Moved to ICU on 6/19 due to low output and worsening AKI. CVVHD catheter placed. Remains on CVVHD pulling 150 ml/hr.   Remains norepi 5 mcg + milrinone 0.375 mcg. Today's co-ox 72%. 160 cc UOP.   Feels short of breath if oxygen is decreased.    Objective:   Weight Range:  Vital Signs:   Temp:  [98.4 F (36.9 C)-98.8 F (37.1 C)] 98.5 F (36.9 C) (06/23 0734) Pulse Rate:  [58-133] 58 (06/23 0900) Resp:  [12-40] 21 (06/23 0900) BP: (89-117)/(51-79) 105/65 (06/23 0900) SpO2:  [85 %-100 %] 100 % (06/23 0900) Weight:  [123 lb 10.9 oz (56.1 kg)] 123 lb 10.9 oz (56.1 kg) (06/23 0500) Last BM Date: 08/31/16  Weight change: Filed Weights   08/30/16 0500 08/31/16 0500 09/01/16 0500  Weight: 133 lb 6.4 oz (60.5 kg) 129 lb 3.2 oz (58.6 kg) 123 lb 10.9 oz (56.1 kg)    Intake/Output:   Intake/Output Summary (Last 24 hours) at 09/01/16 1038 Last data filed at 09/01/16 0900  Gross per 24 hour  Intake           1673.8 ml  Output             5124 ml  Net          -3450.2 ml     Physical Exam: CVP 16 General: Appears fatigued. No resp difficulty HEENT: normal Neck: supple. JVP 16 cm. Carotids 2+ bilat; no bruits. No lymphadenopathy or thryomegaly appreciated. Cor: PMI lateral. Regular rate & rhythm. No rubs, gallops or murmurs.  Lungs: Decreased in the bases  Abdomen: soft, nontender, nondistended. No hepatosplenomegaly. No bruits or masses. Good bowel sounds. Extremities: no cyanosis, clubbing, rash, edema. R femoral HD cath. RUE PICC Neuro: alert & orientedx3, cranial nerves grossly intact. moves all 4 extremities w/o difficulty. Affect pleasant  Telemetry: NSR 50s. Personally reviewed.    Labs: Basic  Metabolic Panel:  Recent Labs Lab 08/28/16 0422  08/30/16 0335 08/30/16 1605 08/31/16 0500 08/31/16 1630 09/01/16 0454 09/01/16 0529  NA 131*  < > 131*  130* 130* 134* 133* 132*  --   K 4.9  < > 4.8  4.8 4.9 4.6 4.6 4.4  --   CL 96*  < > 97*  98* 96* 99* 98* 97*  --   CO2 23  < > 23  23 25 26 29 27   --   GLUCOSE 153*  < > 119*  117* 290* 95 185* 177*  --   BUN 64*  < > 38*  39* 28* 17 15 15   --   CREATININE 3.48*  < > 2.78*  2.82* 2.26* 1.74* 1.73* 1.67*  --   CALCIUM 8.6*  < > 8.5*  8.6* 8.3* 8.7* 8.5* 8.7*  --   MG 2.5*  --  2.5*  --  2.6*  --   --  2.4  PHOS  --   < > 2.4* 2.5 1.9* 1.8* 1.9*  --   < > = values in this interval not displayed.  Liver Function Tests:  Recent Labs Lab 08/30/16 0335 08/30/16 1605 08/31/16 0500 08/31/16 1630 09/01/16 0454  ALBUMIN 3.1* 2.9* 3.2* 3.1* 3.0*   No  results for input(s): LIPASE, AMYLASE in the last 168 hours. No results for input(s): AMMONIA in the last 168 hours.  CBC:  Recent Labs Lab 08/27/16 0539 08/29/16 0435 08/30/16 0335 08/31/16 0500 09/01/16 0529  WBC 4.1 5.3 4.7 5.1 5.7  NEUTROABS  --  3.5  --   --   --   HGB 11.9* 11.3* 11.2* 11.3* 10.4*  HCT 35.1* 33.4* 32.2* 33.5* 31.1*  MCV 81.4 81.5 80.7 82.3 83.2  PLT 100* 112* 110* 128* 126*    Cardiac Enzymes: No results for input(s): CKTOTAL, CKMB, CKMBINDEX, TROPONINI in the last 168 hours.  BNP: BNP (last 3 results)  Recent Labs  09/28/15 1114  BNP 871.3*    ProBNP (last 3 results) No results for input(s): PROBNP in the last 8760 hours.    Other results:  Imaging: No results found.   Medications:     Scheduled Medications: . aspirin EC  81 mg Oral Daily  . insulin aspart  0-9 Units Subcutaneous TID WC  . insulin glargine  5 Units Subcutaneous Daily  . magnesium oxide  400 mg Oral Daily  . pravastatin  20 mg Oral q1800  . sildenafil  20 mg Oral TID  . sodium chloride flush  10-40 mL Intracatheter Q12H    Infusions: .  amiodarone 30 mg/hr (09/01/16 0400)  . heparin 1,000 Units/hr (09/01/16 0400)  . milrinone 0.375 mcg/kg/min (09/01/16 0400)  . norepinephrine (LEVOPHED) Adult infusion 5 mcg/min (09/01/16 0400)  . dialysis replacement fluid (prismasate) 300 mL/hr at 09/01/16 0821  . dialysis replacement fluid (prismasate) 300 mL/hr at 09/01/16 0917  . dialysate (PRISMASATE) 1,000 mL/hr at 09/01/16 1610  . sodium chloride 999 mL/hr at 08/29/16 1307    PRN Medications: acetaminophen, albuterol, benzonatate, chlorpheniramine-HYDROcodone, heparin, ondansetron (ZOFRAN) IV, sodium chloride, sodium chloride flush   Assessment/Plan/Discussion:   Jessica [name] is a 56 year old with h/o chronic systolic heart failure, ICM, Biotronik ICD, and PAH admitted today with new onset A Fib/Flutter  1. A flutter RVR: New onset June 11 with device interrogation. S/P TEE DC-CV with conversion to NSR.  - Maintaining NSR. Continue amio drip while on inotropes.  - Continue heparin gtt for now.  2. A/C Systolic Biventricular Heart Failure/cardiogenic shock: Ischemic cardiomyopathy.  Echo 08/24/16. LVEF 10% severe RV HK (EF down further in setting of AFL).  Remains on dual inotropes, milrinone 0.375 mcg + norepi 5 mcg.  Today's Co-ox 72% with CVP 16 cm.   - No bb or ACE/ARB/ARNI with low output and AKI .  - She is now on CVVH, pulling 150 cc/hr.  Still volume overloaded so would continue today.  If she is not able to effectively come off CVVH/HD, will need to consider hospice care.  3. Acute on chronic renal failure (baseline CKD 3) due to cardio-renal syndrome.  CVVHD started 6/20. Continue to pull as tolerated, remains volume overloaded.  Not a candidate for long term HD.  4. PAH - Continue sildenafil 20 mg TID for now.  5. CAD S/P multiple interventions. No S/S ischemia  - Continues asa.  No nitro with sildenafil.  - She was unable to take simvastatin due to myalgias. Continue pravastatin.  No change.  6. PAD: occluded left SFA with  mild/mod abnormal ABIs. No revascularization was needed October 2017. No change.  7. H/O LV Thrombus: no thrombus on TEE. No change 8. Hyponatremia: Mild, stable.   Jessica [name] 09/01/2016 10:38 AM

## 2016-09-01 NOTE — Procedures (Signed)
Patient seen and examined on CRRT.  Tolerating well.  No filter issues.  QB 250 mL/ min per R fem nontunneled catheter.  UF goal 150 mL/ hr.  Treatment adjusted as needed.  Bufford ButtnerElizabeth Alyanah Elliott MD  [facility] pgr [phone] 1:44 PM

## 2016-09-02 LAB — CBC
HEMATOCRIT: 30.3 % — AB (ref 36.0–46.0)
Hemoglobin: 9.9 g/dL — ABNORMAL LOW (ref 12.0–15.0)
MCH: 27.3 pg (ref 26.0–34.0)
MCHC: 32.7 g/dL (ref 30.0–36.0)
MCV: 83.5 fL (ref 78.0–100.0)
PLATELETS: 128 10*3/uL — AB (ref 150–400)
RBC: 3.63 MIL/uL — AB (ref 3.87–5.11)
RDW: 17 % — ABNORMAL HIGH (ref 11.5–15.5)
WBC: 5.7 10*3/uL (ref 4.0–10.5)

## 2016-09-02 LAB — COOXEMETRY PANEL
CARBOXYHEMOGLOBIN: 1.5 % (ref 0.5–1.5)
METHEMOGLOBIN: 1.4 % (ref 0.0–1.5)
O2 Saturation: 65.4 %
Total hemoglobin: 10.5 g/dL — ABNORMAL LOW (ref 12.0–16.0)

## 2016-09-02 LAB — RENAL FUNCTION PANEL
ANION GAP: 7 (ref 5–15)
Albumin: 2.9 g/dL — ABNORMAL LOW (ref 3.5–5.0)
Albumin: 3.1 g/dL — ABNORMAL LOW (ref 3.5–5.0)
Anion gap: 7 (ref 5–15)
BUN: 12 mg/dL (ref 6–20)
BUN: 10 mg/dL (ref 6–20)
CALCIUM: 8.5 mg/dL — AB (ref 8.9–10.3)
CHLORIDE: 96 mmol/L — AB (ref 101–111)
CHLORIDE: 96 mmol/L — AB (ref 101–111)
CO2: 25 mmol/L (ref 22–32)
CO2: 26 mmol/L (ref 22–32)
CREATININE: 1.7 mg/dL — AB (ref 0.44–1.00)
Calcium: 8.5 mg/dL — ABNORMAL LOW (ref 8.9–10.3)
Creatinine, Ser: 1.82 mg/dL — ABNORMAL HIGH (ref 0.44–1.00)
GFR calc Af Amer: 34 mL/min — ABNORMAL LOW (ref >60)
GFR calc Af Amer: 37 mL/min — ABNORMAL LOW (ref >60)
GFR calc non Af Amer: 30 mL/min — ABNORMAL LOW (ref >60)
GFR, EST NON AFRICAN AMERICAN: 32 mL/min — AB (ref >60)
GLUCOSE: 260 mg/dL — AB (ref 65–99)
GLUCOSE: 168 mg/dL — AB (ref 65–99)
Phosphorus: 1.9 mg/dL — ABNORMAL LOW (ref 2.5–4.6)
Phosphorus: 1.7 mg/dL — ABNORMAL LOW (ref 2.5–4.6)
Potassium: 4.3 mmol/L (ref 3.5–5.1)
Potassium: 4.5 mmol/L (ref 3.5–5.1)
SODIUM: 128 mmol/L — AB (ref 135–145)
Sodium: 129 mmol/L — ABNORMAL LOW (ref 135–145)

## 2016-09-02 LAB — GLUCOSE, CAPILLARY
GLUCOSE-CAPILLARY: 173 mg/dL — AB (ref 65–99)
Glucose-Capillary: 146 mg/dL — ABNORMAL HIGH (ref 65–99)
Glucose-Capillary: 158 mg/dL — ABNORMAL HIGH (ref 65–99)
Glucose-Capillary: 171 mg/dL — ABNORMAL HIGH (ref 65–99)

## 2016-09-02 LAB — HEPARIN LEVEL (UNFRACTIONATED): Heparin Unfractionated: 0.45 IU/mL (ref 0.30–0.70)

## 2016-09-02 LAB — APTT
APTT: 61 s — AB (ref 24–36)
aPTT: >200 seconds (ref 24–36)

## 2016-09-02 MED ORDER — ORAL CARE MOUTH RINSE
15.0000 mL | Freq: Two times a day (BID) | OROMUCOSAL | Status: DC
  Administered 2016-09-02 – 2016-09-06 (×6): 15 mL via OROMUCOSAL

## 2016-09-02 NOTE — Progress Notes (Signed)
Patient ID: Jessica [name], female   DOB: 12/10/1960, 56 y.o.   MRN: [id number]   Advanced Heart Failure Rounding Note   Subjective:    Admitted with new onset atrial flutter and ADHF. Milrinone started 6/15 for worsening HF and renal failure.  S/P TEE and successful DC-CV.   Moved to ICU on 6/19 due to low output and worsening AKI. CVVHD catheter placed. Remains on CVVHD pulling 150 ml/hr.   Remains norepi 4 mcg + milrinone 0.375 mcg. Today's co-ox 65%. 50 cc UOP. CVP 13-14 today.   No complaints.     Objective:   Weight Range:  Vital Signs:   Temp:  [97.9 F (36.6 C)-99.1 F (37.3 C)] 97.9 F (36.6 C) (06/24 0731) Pulse Rate:  [52-69] 60 (06/24 0800) Resp:  [16-24] 21 (06/24 0800) BP: (88-120)/(53-103) 101/63 (06/24 0800) SpO2:  [83 %-99 %] 99 % (06/24 0800) Weight:  [129 lb 3 oz (58.6 kg)] 129 lb 3 oz (58.6 kg) (06/24 0600) Last BM Date: 09/01/16  Weight change: Filed Weights   08/31/16 0500 09/01/16 0500 09/02/16 0600  Weight: 129 lb 3.2 oz (58.6 kg) 123 lb 10.9 oz (56.1 kg) 129 lb 3 oz (58.6 kg)    Intake/Output:   Intake/Output Summary (Last 24 hours) at 09/02/16 0942 Last data filed at 09/02/16 0900  Gross per 24 hour  Intake           1819.7 ml  Output             4815 ml  Net          -2995.3 ml     Physical Exam: CVP 13-14 General: Appears fatigued. No resp difficulty HEENT: normal Neck: supple. JVP 14 cm. Carotids 2+ bilat; no bruits. No lymphadenopathy or thryomegaly appreciated. Cor: PMI lateral. Regular rate & rhythm. No rubs, gallops or murmurs.  Lungs: Decreased at the bases  Abdomen: soft, nontender, nondistended. No hepatosplenomegaly. No bruits or masses. Good bowel sounds. Extremities: no cyanosis, clubbing, rash, edema. R femoral HD cath. RUE PICC Neuro: alert & orientedx3, cranial nerves grossly intact. moves all 4 extremities w/o difficulty. Affect pleasant  Telemetry: NSR 50s. Personally reviewed.    Labs: Basic Metabolic  Panel:  Recent Labs Lab 08/28/16 0422  08/30/16 0335  08/31/16 0500 08/31/16 1630 09/01/16 0454 09/01/16 0529 09/01/16 1552 09/02/16 0438  NA 131*  < > 131*  130*  < > 134* 133* 132*  --  130* 128*  K 4.9  < > 4.8  4.8  < > 4.6 4.6 4.4  --  4.3 4.3  CL 96*  < > 97*  98*  < > 99* 98* 97*  --  97* 96*  CO2 23  < > 23  23  < > 26 29 27   --  26 25  GLUCOSE 153*  < > 119*  117*  < > 95 185* 177*  --  173* 260*  BUN 64*  < > 38*  39*  < > 17 15 15   --  11 12  CREATININE 3.48*  < > 2.78*  2.82*  < > 1.74* 1.73* 1.67*  --  1.66* 1.82*  CALCIUM 8.6*  < > 8.5*  8.6*  < > 8.7* 8.5* 8.7*  --  8.7* 8.5*  MG 2.5*  --  2.5*  --  2.6*  --   --  2.4  --   --   PHOS  --   < > 2.4*  < > 1.9* 1.8* 1.9*  --  1.7* 1.9*  < > = values in this interval not displayed.  Liver Function Tests:  Recent Labs Lab 08/31/16 0500 08/31/16 1630 09/01/16 0454 09/01/16 1552 09/02/16 0438  ALBUMIN 3.2* 3.1* 3.0* 3.1* 2.9*   No results for input(s): LIPASE, AMYLASE in the last 168 hours. No results for input(s): AMMONIA in the last 168 hours.  CBC:  Recent Labs Lab 08/29/16 0435 08/30/16 0335 08/31/16 0500 09/01/16 0529 09/02/16 0438  WBC 5.3 4.7 5.1 5.7 5.7  NEUTROABS 3.5  --   --   --   --   HGB 11.3* 11.2* 11.3* 10.4* 9.9*  HCT 33.4* 32.2* 33.5* 31.1* 30.3*  MCV 81.5 80.7 82.3 83.2 83.5  PLT 112* 110* 128* 126* 128*    Cardiac Enzymes: No results for input(s): CKTOTAL, CKMB, CKMBINDEX, TROPONINI in the last 168 hours.  BNP: BNP (last 3 results)  Recent Labs  09/28/15 1114  BNP 871.3*    ProBNP (last 3 results) No results for input(s): PROBNP in the last 8760 hours.    Other results:  Imaging: No results found.   Medications:     Scheduled Medications: . aspirin EC  81 mg Oral Daily  . Chlorhexidine Gluconate Cloth  6 each Topical Q0600  . insulin aspart  0-9 Units Subcutaneous TID WC  . insulin glargine  5 Units Subcutaneous Daily  . magnesium oxide  400 mg  Oral Daily  . mouth rinse  15 mL Mouth Rinse BID  . pravastatin  20 mg Oral q1800  . sildenafil  20 mg Oral TID  . sodium chloride flush  10-40 mL Intracatheter Q12H    Infusions: . amiodarone 30 mg/hr (09/02/16 0700)  . heparin 1,000 Units/hr (09/02/16 0700)  . milrinone 0.375 mcg/kg/min (09/02/16 0700)  . norepinephrine (LEVOPHED) Adult infusion 4.053 mcg/min (09/02/16 0700)  . dialysis replacement fluid (prismasate) 300 mL/hr at 09/02/16 0002  . dialysis replacement fluid (prismasate) 300 mL/hr at 09/02/16 0002  . dialysate (PRISMASATE) 1,000 mL/hr at 09/02/16 0428  . sodium chloride 999 mL/hr at 08/29/16 1307    PRN Medications: acetaminophen, albuterol, benzonatate, chlorpheniramine-HYDROcodone, heparin, ondansetron (ZOFRAN) IV, sodium chloride, sodium chloride flush   Assessment/Plan/Discussion:   Jessica [name] is a 56 year old with h/o chronic systolic heart failure, ICM, Biotronik ICD, and PAH admitted today with new onset A Fib/Flutter  1. A flutter RVR: New onset June 11 with device interrogation. S/P TEE DC-CV with conversion to NSR.  - Maintaining NSR. Continue amio drip while on inotropes.  - Continue heparin gtt for now.  2. A/C Systolic Biventricular Heart Failure/cardiogenic shock: Ischemic cardiomyopathy.  Echo 08/24/16. LVEF 10% severe RV HK (EF down further in setting of AFL).  Remains on dual inotropes, milrinone 0.375 mcg + norepi 5 mcg.  Today's Co-ox 72% with CVP 16 cm.   - No bb or ACE/ARB/ARNI with low output and AKI .  - She is now on CVVH, pulling 150 cc/hr.  Still volume overloaded so would continue today.  If CVP < 10 tomorrow, can try transitioning to po diuretics.  If she is not able to effectively come off CVVH/HD, will need to consider hospice care.  3. Acute on chronic renal failure (baseline CKD 3) due to cardio-renal syndrome.  CVVHD started 6/20. Continue to pull as tolerated, remains volume overloaded.  Not a candidate for long term HD.  4. PAH -  Continue sildenafil 20 mg TID for now.  5. CAD S/P multiple interventions. No S/S ischemia  - Continues asa.  No nitro with sildenafil.  - She was unable to take simvastatin due to myalgias. Continue pravastatin.  No change.  6. PAD: occluded left SFA with mild/mod abnormal ABIs. No revascularization was needed October 2017. No change.  7. H/O LV Thrombus: no thrombus on TEE. No change 8. Hyponatremia: Mild, fairly stable. Fluid restrict.    Marca AnconaDalton McLean 09/02/2016 9:42 AM

## 2016-09-02 NOTE — Plan of Care (Signed)
Problem: Activity: Goal: Ability to tolerate increased activity will improve Outcome: Not Progressing Pt on CRRT with BR  Problem: Cardiac: Goal: Ability to achieve and maintain adequate cardiopulmonary perfusion will improve Outcome: Progressing Perfusion mananged with levophed,amoidarone, and milrinone gtt

## 2016-09-02 NOTE — Progress Notes (Signed)
[name] Progress Note    Assessment/ Plan:   1.  Acute oliguric kidney injury: secondary to decompensated HF.  Hopefully we can get her back to euvolemia.  There is a possibility that she may go home on home inotropes; if so then long term dialysis is off the table (and to be frank she's not a good candidate anyway).  Continue CRRT today; CVP continues to improve, fluid removal rate at 150 mL/ hr; continues 3.2L net neg  2.  Acute decompensated HF/ cardiogenic shock: on milrinone and levophed.   3.  New Aflutter: s/p successful DCCV.  In NSR.  4.  PAH: on sildenafil 20 mg TID.  5. h/o LV thrombus: on Eliquis as outpatient; on systemic heparin.    6.  Hyponatremia: likely hypervolemia hyponatremia   Subjective:    PT continues on CRRT without issue.  Tolerating 150 mL/ hr.    Objective:   BP 107/62   Pulse (!) 57   Temp 98.9 F (37.2 C) (Oral)   Resp 18   Ht 5\' 5"  (1.651 m)   Wt 58.6 kg (129 lb 3 oz)   SpO2 93%   BMI 21.50 kg/m   Intake/Output Summary (Last 24 hours) at 09/02/16 0727 Last data filed at 09/02/16 0700  Gross per 24 hour  Intake           1380.7 ml  Output             4694 ml  Net          -3313.3 ml   Weight change: 2.5 kg (5 lb 8.2 oz)  Physical Exam: GEN thin female, SOB improved, appears comfortable today and eating lunch HEENT EOMI, PERRL NECK JVD improving PULM clear anteriorly CV RRR LV heave improved ABD distended, hypoactive bowel sounds EXT warm today, trace LE edema NEURO nonfocal SKIN no lesions MSK no synovitis  Imaging: No results found.  Labs: BMET  Recent Labs Lab 08/30/16 0335 08/30/16 1605 08/31/16 0500 08/31/16 1630 09/01/16 0454 09/01/16 1552 09/02/16 0438  NA 131*  130* 130* 134* 133* 132* 130* 128*  K 4.8  4.8 4.9 4.6 4.6 4.4 4.3 4.3  CL 97*  98* 96* 99* 98* 97* 97* 96*  CO2 23  23 25 26 29 27 26 25   GLUCOSE 119*  117* 290* 95 185* 177* 173* 260*  BUN 38*  39* 28* 17 15 15 11 12    CREATININE 2.78*  2.82* 2.26* 1.74* 1.73* 1.67* 1.66* 1.82*  CALCIUM 8.5*  8.6* 8.3* 8.7* 8.5* 8.7* 8.7* 8.5*  PHOS 2.4* 2.5 1.9* 1.8* 1.9* 1.7* 1.9*   CBC  Recent Labs Lab 08/29/16 0435 08/30/16 0335 08/31/16 0500 09/01/16 0529 09/02/16 0438  WBC 5.3 4.7 5.1 5.7 5.7  NEUTROABS 3.5  --   --   --   --   HGB 11.3* 11.2* 11.3* 10.4* 9.9*  HCT 33.4* 32.2* 33.5* 31.1* 30.3*  MCV 81.5 80.7 82.3 83.2 83.5  PLT 112* 110* 128* 126* 128*    Medications:    . aspirin EC  81 mg Oral Daily  . Chlorhexidine Gluconate Cloth  6 each Topical Q0600  . insulin aspart  0-9 Units Subcutaneous TID WC  . insulin glargine  5 Units Subcutaneous Daily  . magnesium oxide  400 mg Oral Daily  . pravastatin  20 mg Oral q1800  . sildenafil  20 mg Oral TID  . sodium chloride flush  10-40 mL Intracatheter Q12H  Bufford ButtnerElizabeth Kelcie Currie, MD [facility] pgr [phone] 09/02/2016, 7:27 AM

## 2016-09-02 NOTE — Procedures (Signed)
Patient seen and examined on CRRT.  Tolerating well.  Filters clotted last night, changed x 1. QB 250 mL/ min per R fem nontunneled catheter.  UF goal 150 mL/ hr.  Treatment adjusted as needed.  Bufford ButtnerElizabeth Hanif Radin MD  [facility] pgr [phone] 7:31 AM

## 2016-09-02 NOTE — Progress Notes (Signed)
ANTICOAGULATION CONSULT NOTE - Follow Up Consult  Pharmacy Consult for Heparin Indication: atrial fibrillation  Allergies  Allergen Reactions  . Nyquil Multi-Symptom [Pseudoeph-Doxylamine-Dm-Apap] Other (See Comments)    Fatigue, chest pressure and pain Pt tolerates Coricidin cough syrup at home  . Sudafed [Pseudoephedrine] Other (See Comments)    Fatigue, chest pressure and pain  . Lasix [Furosemide]     Rash  . Torsemide     Rash  . Zocor [Simvastatin]     Myalgias   . Latex Rash  . Ramipril Cough    Patient Measurements: Height: 5\' 5"  (165.1 cm) Weight: 129 lb 3 oz (58.6 kg) IBW/kg (Calculated) : 57  Vital Signs: Temp: 97.9 F (36.6 C) (06/24 0731) Temp Source: Oral (06/24 0731) BP: 84/46 (06/24 1000) Pulse Rate: 55 (06/24 1000)  Labs:  Recent Labs  08/31/16 0500  09/01/16 0454 09/01/16 0529 09/01/16 1551 09/01/16 1552 09/02/16 0438 09/02/16 0734  HGB 11.3*  --   --  10.4*  --   --  9.9*  --   HCT 33.5*  --   --  31.1*  --   --  30.3*  --   PLT 128*  --   --  126*  --   --  128*  --   APTT 74*  --   --  67*  --   --  >200* 61*  HEPARINUNFRC 1.12*  --   --   --  0.60  --   --  0.45  CREATININE 1.74*  < > 1.67*  --   --  1.66* 1.82*  --   < > = values in this interval not displayed.  Estimated Creatinine Clearance: 30.3 mL/min (A) (by C-G formula based on SCr of 1.82 mg/dL (H)).  Medications: Heparin @ 1000 units/hr  Assessment: 56yof on eliquis for afib, s/p DCCV on 6/18, transitioned to IV heparin 6/20 anticipating CVVHD. Heparin level is therapeutic at 0.45. Hgb slowly trending down, platelets low but stable. No bleeding.  Goal of Therapy:  Heparin level 0.3-0.7 units/ml Monitor platelets by anticoagulation protocol: Yes   Plan:  1) Continue heparin at 1000 units/hr 2) Daily heparin level and CBC   Fredrik RiggerMarkle, Aydeen Blume Sue 09/02/2016,10:11 AM    ADDENDUM: Heparin level came back at 0.60 which is therapeutic and starting to correlate with  aPTT. Will check both heparin level and aPTT for one more day and then can probably just use heparin levels.  Fredrik RiggerMarkle, Lennon Richins Sue 09/01/2016, 4:44 PM

## 2016-09-03 LAB — GLUCOSE, CAPILLARY
GLUCOSE-CAPILLARY: 153 mg/dL — AB (ref 65–99)
GLUCOSE-CAPILLARY: 146 mg/dL — AB (ref 65–99)
Glucose-Capillary: 220 mg/dL — ABNORMAL HIGH (ref 65–99)
Glucose-Capillary: 204 mg/dL — ABNORMAL HIGH (ref 65–99)

## 2016-09-03 LAB — COOXEMETRY PANEL
CARBOXYHEMOGLOBIN: 1.6 % — AB (ref 0.5–1.5)
CARBOXYHEMOGLOBIN: 1.5 % (ref 0.5–1.5)
METHEMOGLOBIN: 0.7 % (ref 0.0–1.5)
METHEMOGLOBIN: 1 % (ref 0.0–1.5)
O2 Saturation: 98.9 %
O2 Saturation: 47.2 %
Total hemoglobin: 10.1 g/dL — ABNORMAL LOW (ref 12.0–16.0)
Total hemoglobin: 15.1 g/dL (ref 12.0–16.0)

## 2016-09-03 LAB — CBC
HEMATOCRIT: 31.7 % — AB (ref 36.0–46.0)
Hemoglobin: 10.6 g/dL — ABNORMAL LOW (ref 12.0–15.0)
MCH: 27.8 pg (ref 26.0–34.0)
MCHC: 33.4 g/dL (ref 30.0–36.0)
MCV: 83.2 fL (ref 78.0–100.0)
PLATELETS: 138 10*3/uL — AB (ref 150–400)
RBC: 3.81 MIL/uL — AB (ref 3.87–5.11)
RDW: 17 % — ABNORMAL HIGH (ref 11.5–15.5)
WBC: 6.1 10*3/uL (ref 4.0–10.5)

## 2016-09-03 LAB — RENAL FUNCTION PANEL
ANION GAP: 8 (ref 5–15)
Albumin: 3 g/dL — ABNORMAL LOW (ref 3.5–5.0)
Albumin: 2.9 g/dL — ABNORMAL LOW (ref 3.5–5.0)
Anion gap: 8 (ref 5–15)
BUN: 12 mg/dL (ref 6–20)
BUN: 10 mg/dL (ref 6–20)
CALCIUM: 8.2 mg/dL — AB (ref 8.9–10.3)
CHLORIDE: 97 mmol/L — AB (ref 101–111)
CHLORIDE: 94 mmol/L — AB (ref 101–111)
CO2: 26 mmol/L (ref 22–32)
CO2: 25 mmol/L (ref 22–32)
CREATININE: 1.74 mg/dL — AB (ref 0.44–1.00)
Calcium: 8.8 mg/dL — ABNORMAL LOW (ref 8.9–10.3)
Creatinine, Ser: 1.87 mg/dL — ABNORMAL HIGH (ref 0.44–1.00)
GFR calc Af Amer: 36 mL/min — ABNORMAL LOW (ref >60)
GFR calc Af Amer: 33 mL/min — ABNORMAL LOW (ref >60)
GFR calc non Af Amer: 29 mL/min — ABNORMAL LOW (ref >60)
GFR, EST NON AFRICAN AMERICAN: 31 mL/min — AB (ref >60)
GLUCOSE: 152 mg/dL — AB (ref 65–99)
GLUCOSE: 351 mg/dL — AB (ref 65–99)
POTASSIUM: 4.3 mmol/L (ref 3.5–5.1)
Phosphorus: 1.7 mg/dL — ABNORMAL LOW (ref 2.5–4.6)
Phosphorus: 1.8 mg/dL — ABNORMAL LOW (ref 2.5–4.6)
Potassium: 4.6 mmol/L (ref 3.5–5.1)
SODIUM: 127 mmol/L — AB (ref 135–145)
Sodium: 131 mmol/L — ABNORMAL LOW (ref 135–145)

## 2016-09-03 LAB — MAGNESIUM: Magnesium: 2.6 mg/dL — ABNORMAL HIGH (ref 1.7–2.4)

## 2016-09-03 LAB — HEPARIN LEVEL (UNFRACTIONATED): Heparin Unfractionated: 0.31 IU/mL (ref 0.30–0.70)

## 2016-09-03 MED ORDER — DOCUSATE SODIUM 100 MG PO CAPS
100.0000 mg | ORAL_CAPSULE | Freq: Every day | ORAL | Status: DC | PRN
  Administered 2016-09-03 – 2016-09-18 (×5): 100 mg via ORAL
  Filled 2016-09-03 (×5): qty 1

## 2016-09-03 MED ORDER — CHLORHEXIDINE GLUCONATE CLOTH 2 % EX PADS
6.0000 | MEDICATED_PAD | Freq: Every day | CUTANEOUS | Status: DC
  Administered 2016-09-03 – 2016-09-22 (×17): 6 via TOPICAL

## 2016-09-03 MED ORDER — CHLORHEXIDINE GLUCONATE CLOTH 2 % EX PADS: 6.0000 | MEDICATED_PAD | Freq: Every day | CUTANEOUS | Status: DC

## 2016-09-03 NOTE — Progress Notes (Signed)
ANTICOAGULATION CONSULT NOTE - Follow Up Consult  Pharmacy Consult for Heparin Indication: atrial fibrillation  Allergies  Allergen Reactions  . Nyquil Multi-Symptom [Pseudoeph-Doxylamine-Dm-Apap] Other (See Comments)    Fatigue, chest pressure and pain Pt tolerates Coricidin cough syrup at home  . Sudafed [Pseudoephedrine] Other (See Comments)    Fatigue, chest pressure and pain  . Lasix [Furosemide]     Rash  . Torsemide     Rash  . Zocor [Simvastatin]     Myalgias   . Latex Rash  . Ramipril Cough    Patient Measurements: Height: 5\' 5"  (165.1 cm) Weight: 124 lb 1.9 oz (56.3 kg) IBW/kg (Calculated) : 57  Vital Signs: Temp: 98.1 F (36.7 C) (06/25 0700) Temp Source: Oral (06/25 0700) BP: 99/59 (06/25 0800) Pulse Rate: 52 (06/25 0800)  Labs:  Recent Labs  09/01/16 0529 09/01/16 1551  09/02/16 0438 09/02/16 0734 09/02/16 1600 09/03/16 0210  HGB 10.4*  --   --  9.9*  --   --  10.6*  HCT 31.1*  --   --  30.3*  --   --  31.7*  PLT 126*  --   --  128*  --   --  138*  APTT 67*  --   --  >200* 61*  --   --   HEPARINUNFRC  --  0.60  --   --  0.45  --  0.31  CREATININE  --   --   < > 1.82*  --  1.70* 1.74*  < > = values in this interval not displayed.  Estimated Creatinine Clearance: 31.3 mL/min (A) (by C-G formula based on SCr of 1.74 mg/dL (H)).  Medications: Heparin @ 1000 units/hr  Assessment: 56yof on eliquis for afib, s/p DCCV on 6/18, transitioned to IV heparin 6/20 anticipating CVVHD. Heparin level is therapeutic at 0.31. Hgb slowly trending down, platelets low but stable. No bleeding observed or noted on rounds.  Heparin level at low end of goal will increase rate to 1050 units/hr.   Goal of Therapy:  Heparin level 0.3-0.7 units/ml Monitor platelets by anticoagulation protocol: Yes   Plan:  1) Increase heparin to 1050 units/hr 2) Daily heparin level and CBC  Sheppard CoilFrank Trudee Chirino PharmD., BCPS Clinical Pharmacist Pager [phone] 09/03/2016 8:12 AM

## 2016-09-03 NOTE — Progress Notes (Signed)
Assessment/ Plan:   1. Acute oliguric kidney injury: secondary to decompensated HF. Remains on pressors and bradycardicContinue CRRT today;  fluid removal rate at 150 mL/ hr; continues net neg bal, stop mag, bladder scan 2. Acute decompensated HF/ cardiogenic shock: on milrinone and levophed.   3. New Aflutter: s/p successful DCCV. In NSR.  4. PAH: on sildenafil 20 mg TID.  5. h/o LV thrombus: on Eliquis as outpatient; on systemic heparin.    6.  Hyponatremia: hypervolemia hyponatremia   Subjective: Interval Hist: c/o urge to urinate  Objective: Vital signs in last 24 hours: Temp:  [97.8 F (36.6 C)-99.2 F (37.3 C)] 98.1 F (36.7 C) (06/25 0700) Pulse Rate:  [50-87] 52 (06/25 0800) Resp:  [13-28] 18 (06/25 0800) BP: (84-111)/(46-68) 99/59 (06/25 0800) SpO2:  [86 %-100 %] 100 % (06/25 0800) Weight:  [55.7 kg (122 lb 12.8 oz)-56.3 kg (124 lb 1.9 oz)] 56.3 kg (124 lb 1.9 oz) (06/25 0600) Weight change: -2.898 kg (-6 lb 6.2 oz)  Intake/Output from previous day: 06/24 0701 - 06/25 0700 In: 1953.1 [P.O.:1080; I.V.:873.1] Out: 5399  Intake/Output this shift: Total I/O In: 37 [I.V.:37] Out: 201 [Other:201]+  General appearance: alert and cooperative Neck: no adenopathy, no carotid bruit, supple, symmetrical, trachea midline, thyroid not enlarged, symmetric, no tenderness/mass/nodules and neck veins up, pos HJR Resp: rales bibasilar and bilaterally Chest wall: no tenderness Cardio: regular rate and rhythm, S1, S2 normal, no murmur, click, rub or gallop Extremities: extremities normal, atraumatic, no cyanosis or edema  Lab Results:  Recent Labs  09/02/16 0438 09/03/16 0210  WBC 5.7 6.1  HGB 9.9* 10.6*  HCT 30.3* 31.7*  PLT 128* 138*   BMET:  Recent Labs  09/02/16 1600 09/03/16 0210  NA 129* 131*  K 4.5 4.6  CL 96* 97*  CO2 26 26  GLUCOSE 168* 152*  BUN 10 12  CREATININE 1.70* 1.74*  CALCIUM 8.5* 8.8*   No results for input(s): PTH in the last 72  hours. Iron Studies: No results for input(s): IRON, TIBC, TRANSFERRIN, FERRITIN in the last 72 hours. Studies/Results: No results found.  Scheduled: . aspirin EC  81 mg Oral Daily  . Chlorhexidine Gluconate Cloth  6 each Topical Q0600  . insulin aspart  0-9 Units Subcutaneous TID WC  . insulin glargine  5 Units Subcutaneous Daily  . magnesium oxide  400 mg Oral Daily  . mouth rinse  15 mL Mouth Rinse BID  . pravastatin  20 mg Oral q1800  . sildenafil  20 mg Oral TID  . sodium chloride flush  10-40 mL Intracatheter Q12H      LOS: 12 days   Davyd Podgorski C 09/03/2016,8:49 AM

## 2016-09-03 NOTE — Progress Notes (Signed)
Patient ID: Jessica [name], female   DOB: March 11, 1961, 56 y.o.   MRN: [id number]   Advanced Heart Failure Rounding Note   Subjective:    Admitted with new onset atrial flutter and ADHF. Milrinone started 6/15 for worsening HF and renal failure.  S/P TEE and successful DC-CV.   Moved to ICU on 6/19 due to low output and worsening AKI. CVVHD catheter placed. Remains on CVVHD. Pulling 150 per hour. Remains on CVVHD pulling 150 ml/hr.   Remains on norepi 4 mcg + 0.375 mcg milrinone. Todays CO-OX is down to 47%. CVP up to 16.  Anuric.   Denies SOB. Feeling better.    Objective:   Weight Range:  Vital Signs:   Temp:  [97.8 F (36.6 C)-99.2 F (37.3 C)] 98.1 F (36.7 C) (06/25 0700) Pulse Rate:  [50-87] 52 (06/25 0800) Resp:  [13-28] 18 (06/25 0800) BP: (84-111)/(46-68) 99/59 (06/25 0800) SpO2:  [86 %-100 %] 100 % (06/25 0800) Weight:  [122 lb 12.8 oz (55.7 kg)-124 lb 1.9 oz (56.3 kg)] 124 lb 1.9 oz (56.3 kg) (06/25 0600) Last BM Date: 09/01/16  Weight change: Filed Weights   09/02/16 0600 09/03/16 0313 09/03/16 0600  Weight: 129 lb 3 oz (58.6 kg) 122 lb 12.8 oz (55.7 kg) 124 lb 1.9 oz (56.3 kg)    Intake/Output:   Intake/Output Summary (Last 24 hours) at 09/03/16 0803 Last data filed at 09/03/16 0800  Gross per 24 hour  Intake          1833.11 ml  Output             5229 ml  Net         -3395.89 ml     Physical Exam: CVP 16 General:  Chronically ill appearing. No resp difficulty HEENT: normal Neck: supple. JVP to jaw. Carotids 2+ bilat; no bruits. No lymphadenopathy or thryomegaly appreciated. Cor: PMI nondisplaced. Regular rate & rhythm. No rubs, gallops or murmurs. Lungs: clear Abdomen: soft, nontender, nondistended. No hepatosplenomegaly. No bruits or masses. Good bowel sounds. Extremities: no cyanosis, clubbing, rash, edema. R femoral HD cath. RUE PICC Neuro: alert & orientedx3, cranial nerves grossly intact. moves all 4 extremities w/o difficulty. Affect  pleasant  Telemetry: Personally reviewed. NSR 50-60s.     Labs: Basic Metabolic Panel:  Recent Labs Lab 08/28/16 0422  08/30/16 0335  08/31/16 0500  09/01/16 0454 09/01/16 0529 09/01/16 1552 09/02/16 0438 09/02/16 1600 09/03/16 0210  NA 131*  < > 131*  130*  < > 134*  < > 132*  --  130* 128* 129* 131*  K 4.9  < > 4.8  4.8  < > 4.6  < > 4.4  --  4.3 4.3 4.5 4.6  CL 96*  < > 97*  98*  < > 99*  < > 97*  --  97* 96* 96* 97*  CO2 23  < > 23  23  < > 26  < > 27  --  26 25 26 26   GLUCOSE 153*  < > 119*  117*  < > 95  < > 177*  --  173* 260* 168* 152*  BUN 64*  < > 38*  39*  < > 17  < > 15  --  11 12 10 12   CREATININE 3.48*  < > 2.78*  2.82*  < > 1.74*  < > 1.67*  --  1.66* 1.82* 1.70* 1.74*  CALCIUM 8.6*  < > 8.5*  8.6*  < > 8.7*  < >  8.7*  --  8.7* 8.5* 8.5* 8.8*  MG 2.5*  --  2.5*  --  2.6*  --   --  2.4  --   --   --  2.6*  PHOS  --   < > 2.4*  < > 1.9*  < > 1.9*  --  1.7* 1.9* 1.7* 1.7*  < > = values in this interval not displayed.  Liver Function Tests:  Recent Labs Lab 09/01/16 0454 09/01/16 1552 09/02/16 0438 09/02/16 1600 09/03/16 0210  ALBUMIN 3.0* 3.1* 2.9* 3.1* 3.0*   No results for input(s): LIPASE, AMYLASE in the last 168 hours. No results for input(s): AMMONIA in the last 168 hours.  CBC:  Recent Labs Lab 08/29/16 0435 08/30/16 0335 08/31/16 0500 09/01/16 0529 09/02/16 0438 09/03/16 0210  WBC 5.3 4.7 5.1 5.7 5.7 6.1  NEUTROABS 3.5  --   --   --   --   --   HGB 11.3* 11.2* 11.3* 10.4* 9.9* 10.6*  HCT 33.4* 32.2* 33.5* 31.1* 30.3* 31.7*  MCV 81.5 80.7 82.3 83.2 83.5 83.2  PLT 112* 110* 128* 126* 128* 138*    Cardiac Enzymes: No results for input(s): CKTOTAL, CKMB, CKMBINDEX, TROPONINI in the last 168 hours.  BNP: BNP (last 3 results)  Recent Labs  09/28/15 1114  BNP 871.3*    ProBNP (last 3 results) No results for input(s): PROBNP in the last 8760 hours.    Other results:  Imaging: No results found.   Medications:      Scheduled Medications: . aspirin EC  81 mg Oral Daily  . Chlorhexidine Gluconate Cloth  6 each Topical Q0600  . insulin aspart  0-9 Units Subcutaneous TID WC  . insulin glargine  5 Units Subcutaneous Daily  . magnesium oxide  400 mg Oral Daily  . mouth rinse  15 mL Mouth Rinse BID  . pravastatin  20 mg Oral q1800  . sildenafil  20 mg Oral TID  . sodium chloride flush  10-40 mL Intracatheter Q12H    Infusions: . amiodarone 30 mg/hr (09/03/16 0700)  . heparin 1,000 Units/hr (09/03/16 0700)  . milrinone 0.375 mcg/kg/min (09/03/16 0700)  . norepinephrine (LEVOPHED) Adult infusion 4 mcg/min (09/03/16 0700)  . dialysis replacement fluid (prismasate) 300 mL/hr at 09/02/16 1802  . dialysis replacement fluid (prismasate) 300 mL/hr at 09/02/16 1802  . dialysate (PRISMASATE) 1,000 mL/hr at 09/03/16 0626  . sodium chloride 999 mL/hr at 08/29/16 1307    PRN Medications: acetaminophen, albuterol, benzonatate, chlorpheniramine-HYDROcodone, heparin, ondansetron (ZOFRAN) IV, sodium chloride, sodium chloride flush   Assessment/Plan/Discussion:   Jessica [name] is a 56 year old with h/o chronic systolic heart failure, ICM, Biotronik ICD, and PAH admitted today with new onset A Fib/Flutter  1. A flutter RVR: New onset June 11 with device interrogation. S/P TEE DC-CV with conversion to NSR.  -Maintaining NSR. Continue amio drip on inotropes.   - Continue heparin gtt for now. Discussed with Pharm D  2. A/C Systolic Biventricular Heart Failure/cardiogenic shock: Ischemic cardiomyopathy.  Echo 08/24/16. LVEF 10% severe RV HK (EF down further in setting of AFL).   Remains on dual inotropes, milrinone 0.375 mcg + 4 mcg norepi. Todays CO-OX is 47%.  Increase norep to 5 mcg. Will not wean.  CVP 16.  - No bb or ACE/ARB/ARNI with low output and AKI .  - She is now on CVVH, pulling 150 cc/hr.  .  If she is not able to effectively come off CVVH/HD, will need to consider hospice  care.  3. Acute on chronic  renal failure (baseline CKD 3) due to cardio-renal syndrome.  CVVHD started 6/20. Continue to pull 150 cc per hour. CVP 16.  Not a candidate for long term HD.  4. PAH - Continue sildenafil 20 mg TID for now.  5. CAD S/P multiple interventions. No S/S ischemia  - Continues asa.  No nitro with sildenafil.  - She was unable to take simvastatin due to myalgias. Tolerating pravastatin. .  6. PAD: occluded left SFA with mild/mod abnormal ABIs. No revascularization was needed October 2017. No change.  7. H/O LV Thrombus: no thrombus on TEE. No change 8. Hyponatremia: sodium 131. Fluid restrict.    Amy Clegg NP-C  09/03/2016 8:03 AM  Agree with above.   She remains very tenuous. Co-ox low again today on dual pressors. Will titrate norepi back up with goal co-ox > 55%  Volume status improving on CVVHD. She has had ~300cc urine output so far today. Given severity of RV failure would not push CVP below 10. Will need to keep MAP >= 70 and avoid pullin gto much fluid in order to give kidneys best chance of recovery. She realizes that if kidneys don't recover Hospice maybe only option.   On exam  Sitting up in bed NAD JVP to jaw Cor RRR +S3 and RV lift. ICD site with skin wound Lungs decreased at bases Ab soft NT Ext: no edema  R femoral trialysis cath   Tele SR  As above. Continue pressors to keep MAP > 70 and co-ox >= 55%. Will likely need to stop pulling with CVVHD soon to keep CVP from going below 10 to optimize chance of renal recvoery.   Watch skin wound as well.   CRITICAL CARE Performed by: Arvilla Meres  Total critical care time: 35 minutes  Critical care time was exclusive of separately billable procedures and treating other patients.  Critical care was necessary to treat or prevent imminent or life-threatening deterioration.  Critical care was time spent personally by me (independent of midlevel providers or residents) on the following activities: development of treatment  plan with patient and/or surrogate as well as nursing, discussions with consultants, evaluation of patient's response to treatment, examination of patient, obtaining history from patient or surrogate, ordering and performing treatments and interventions, ordering and review of laboratory studies, ordering and review of radiographic studies, pulse oximetry and re-evaluation of patient's condition.  Arvilla Meres, MD  5:36 PM

## 2016-09-04 LAB — RENAL FUNCTION PANEL
ALBUMIN: 3.2 g/dL — AB (ref 3.5–5.0)
ANION GAP: 9 (ref 5–15)
Albumin: 3 g/dL — ABNORMAL LOW (ref 3.5–5.0)
Anion gap: 7 (ref 5–15)
BUN: 16 mg/dL (ref 6–20)
BUN: 16 mg/dL (ref 6–20)
CALCIUM: 8.7 mg/dL — AB (ref 8.9–10.3)
CHLORIDE: 97 mmol/L — AB (ref 101–111)
CO2: 24 mmol/L (ref 22–32)
CO2: 25 mmol/L (ref 22–32)
CREATININE: 1.7 mg/dL — AB (ref 0.44–1.00)
Calcium: 8.8 mg/dL — ABNORMAL LOW (ref 8.9–10.3)
Chloride: 96 mmol/L — ABNORMAL LOW (ref 101–111)
Creatinine, Ser: 2.08 mg/dL — ABNORMAL HIGH (ref 0.44–1.00)
GFR calc Af Amer: 37 mL/min — ABNORMAL LOW (ref >60)
GFR calc non Af Amer: 32 mL/min — ABNORMAL LOW (ref >60)
GFR, EST AFRICAN AMERICAN: 29 mL/min — AB (ref >60)
GFR, EST NON AFRICAN AMERICAN: 25 mL/min — AB (ref >60)
GLUCOSE: 197 mg/dL — AB (ref 65–99)
Glucose, Bld: 184 mg/dL — ABNORMAL HIGH (ref 65–99)
PHOSPHORUS: 1.9 mg/dL — AB (ref 2.5–4.6)
POTASSIUM: 4.3 mmol/L (ref 3.5–5.1)
Phosphorus: 1.7 mg/dL — ABNORMAL LOW (ref 2.5–4.6)
Potassium: 4.7 mmol/L (ref 3.5–5.1)
SODIUM: 129 mmol/L — AB (ref 135–145)
Sodium: 129 mmol/L — ABNORMAL LOW (ref 135–145)

## 2016-09-04 LAB — CBC
HEMATOCRIT: 32.9 % — AB (ref 36.0–46.0)
Hemoglobin: 10.9 g/dL — ABNORMAL LOW (ref 12.0–15.0)
MCH: 27.8 pg (ref 26.0–34.0)
MCHC: 33.1 g/dL (ref 30.0–36.0)
MCV: 83.9 fL (ref 78.0–100.0)
Platelets: 158 10*3/uL (ref 150–400)
RBC: 3.92 MIL/uL (ref 3.87–5.11)
RDW: 17.2 % — AB (ref 11.5–15.5)
WBC: 5.9 10*3/uL (ref 4.0–10.5)

## 2016-09-04 LAB — GLUCOSE, CAPILLARY
GLUCOSE-CAPILLARY: 191 mg/dL — AB (ref 65–99)
Glucose-Capillary: 155 mg/dL — ABNORMAL HIGH (ref 65–99)
Glucose-Capillary: 213 mg/dL — ABNORMAL HIGH (ref 65–99)

## 2016-09-04 LAB — COOXEMETRY PANEL
Carboxyhemoglobin: 1.9 % — ABNORMAL HIGH (ref 0.5–1.5)
Methemoglobin: 0.9 % (ref 0.0–1.5)
O2 Saturation: 63.2 %
Total hemoglobin: 11.1 g/dL — ABNORMAL LOW (ref 12.0–16.0)

## 2016-09-04 LAB — HEPARIN LEVEL (UNFRACTIONATED): HEPARIN UNFRACTIONATED: 0.34 [IU]/mL (ref 0.30–0.70)

## 2016-09-04 LAB — MAGNESIUM: MAGNESIUM: 2.6 mg/dL — AB (ref 1.7–2.4)

## 2016-09-04 MED ORDER — AQUAPHOR EX OINT
TOPICAL_OINTMENT | Freq: Two times a day (BID) | CUTANEOUS | Status: DC | PRN
  Administered 2016-09-21: 09:00:00 via TOPICAL
  Filled 2016-09-04 (×2): qty 50

## 2016-09-04 MED ORDER — CEFAZOLIN SODIUM-DEXTROSE 2-4 GM/100ML-% IV SOLN: 2.0000 g | INTRAVENOUS | Status: AC

## 2016-09-04 MED ORDER — INSULIN ASPART 100 UNIT/ML ~~LOC~~ SOLN
0.0000 [IU] | Freq: Three times a day (TID) | SUBCUTANEOUS | Status: DC
  Administered 2016-09-04: 3 [IU] via SUBCUTANEOUS
  Administered 2016-09-05: 5 [IU] via SUBCUTANEOUS
  Administered 2016-09-05: 2 [IU] via SUBCUTANEOUS
  Administered 2016-09-05: 3 [IU] via SUBCUTANEOUS
  Administered 2016-09-06: 2 [IU] via SUBCUTANEOUS
  Administered 2016-09-06: 3 [IU] via SUBCUTANEOUS
  Administered 2016-09-06: 5 [IU] via SUBCUTANEOUS
  Administered 2016-09-07: 3 [IU] via SUBCUTANEOUS
  Administered 2016-09-07: 5 [IU] via SUBCUTANEOUS
  Administered 2016-09-08 (×3): 3 [IU] via SUBCUTANEOUS
  Administered 2016-09-09: 5 [IU] via SUBCUTANEOUS
  Administered 2016-09-09 – 2016-09-10 (×2): 3 [IU] via SUBCUTANEOUS
  Administered 2016-09-10: 8 [IU] via SUBCUTANEOUS
  Administered 2016-09-10: 2 [IU] via SUBCUTANEOUS
  Administered 2016-09-11: 11 [IU] via SUBCUTANEOUS

## 2016-09-04 MED ORDER — HYDROXYZINE HCL 25 MG PO TABS
25.0000 mg | ORAL_TABLET | ORAL | Status: DC | PRN
  Administered 2016-09-04 – 2016-09-19 (×7): 25 mg via ORAL
  Filled 2016-09-04 (×7): qty 1

## 2016-09-04 NOTE — Progress Notes (Signed)
ANTICOAGULATION CONSULT NOTE - Follow Up Consult  Pharmacy Consult for Heparin Indication: atrial fibrillation  Allergies  Allergen Reactions  . Nyquil Multi-Symptom [Pseudoeph-Doxylamine-Dm-Apap] Other (See Comments)    Fatigue, chest pressure and pain Pt tolerates Coricidin cough syrup at home  . Sudafed [Pseudoephedrine] Other (See Comments)    Fatigue, chest pressure and pain  . Lasix [Furosemide]     Rash  . Torsemide     Rash  . Zocor [Simvastatin]     Myalgias   . Latex Rash  . Ramipril Cough    Patient Measurements: Height: 5\' 5"  (165.1 cm) Weight: 121 lb (54.9 kg) IBW/kg (Calculated) : 57  Vital Signs: Temp: 98.8 F (37.1 C) (06/26 0407) Temp Source: Oral (06/26 0407) BP: 87/46 (06/26 0700) Pulse Rate: 55 (06/26 0700)  Labs:  Recent Labs  09/02/16 0438 09/02/16 0734  09/03/16 0210 09/03/16 1628 09/04/16 0355  HGB 9.9*  --   --  10.6*  --  10.9*  HCT 30.3*  --   --  31.7*  --  32.9*  PLT 128*  --   --  138*  --  158  APTT >200* 61*  --   --   --   --   HEPARINUNFRC  --  0.45  --  0.31  --  0.34  CREATININE 1.82*  --   < > 1.74* 1.87* 2.08*  < > = values in this interval not displayed.  Estimated Creatinine Clearance: 25.6 mL/min (A) (by C-G formula based on SCr of 2.08 mg/dL (H)).  Medications: Heparin @ 1000 units/hr  Assessment: 56yof on eliquis for afib, s/p DCCV on 6/18, transitioned to IV heparin 6/20 anticipating CVVHD. Heparin level is therapeutic at 0.34. Hgb stable overnight, platelets count improving. No bleeding observed or noted on rounds.  Goal of Therapy:  Heparin level 0.3-0.7 units/ml Monitor platelets by anticoagulation protocol: Yes   Plan:  1) Continue heparin at 1050 units/hr 2) Daily heparin level and CBC  Sheppard CoilFrank Harlym Gehling PharmD., BCPS Clinical Pharmacist Pager [phone] 09/04/2016 7:22 AM

## 2016-09-04 NOTE — Consult Note (Signed)
Chief Complaint: Patient was seen in consultation today for tunneled dialysis catheter placement Chief Complaint  Patient presents with  . Shortness of Breath  . Atrial Fibrillation   at the request of Dr Sherlon HandingA Powell  Referring Physician(s): Dr Sherlon HandingA Powell  Supervising Physician: Ruel FavorsShick, Trevor  Patient Status: [facility] - In[facility]pt  History of Present Illness: Jessica [name] is a 56 y.o. female   Acute / chronic Renal failure-  Cardio - renal syndrome Acute oliguric renal injury; decompensated HF Heart failure; new onset atrial flutter  Temp cath placed 08/29/16- CCM Right femoral vein In use now  Dr Lowell GuitarPowell requesting tunneled catheter placement Worsening renal failure; Creatinine rising    Past Medical History:  Diagnosis Date  . AICD (automatic cardioverter/defibrillator) present    a. Biotronik placed 2009. b. ICD discharge 2011, 03/2013.  [name]. Alcohol abuse   . Anxiety   . Arterial embolism (HCC)    a. H/o RLE extremity ischemia in 2012 s/p right iliofemoral embolectomy with compartment fasciotomy.  . Asthma   . CAD (coronary artery disease)    a. 1996: anterior wall MI tx with PTCA. b. 2003: PCI/DES to circumflex marginal. c. 2004: PTCA/CBA/stenting of ostium of D1 and LAD. d. 06/2003: s/p PTCA/DES of mRCA. e. 11/2003: CBA of bifurcation diagonal LAD. f. 2007: PTCA/DES to RCA, CBA/stenting to OM2.  . Chronic systolic heart failure (HCC)    a.  secondary to severe ischemic cardiomyopathy with EF 20-25%;  b. s/p rimplantation of Biotronik single chamber ICD by Dr Ladona Ridgelaylor 02/2008;  c.  RHC 07/2013 showed well compensated left sided pressures with moderate PAH and low cardiac output. d. 2D Echo 03/2014: EF 20-25%, grade 2 DD, PAP 58mmHg.  . CKD (chronic kidney disease), stage II   . COPD (chronic obstructive pulmonary disease) (HCC)   . Diabetes mellitus, type 2 (HCC)   . Dizziness   . DVT (deep venous thrombosis) (HCC)    a. 06/2010.  [name]. GERD (gastroesophageal reflux disease)   .  History of noncompliance with medical treatment   . HLD (hyperlipidemia)   . LV (left ventricular) mural thrombus    a. Dx on echo 03/2014.  [name]. Myocardial infarction (HCC)   . Pulmonary hypertension (HCC)   . Sleep apnea   . Tobacco abuse   . V-tach [facility](HCC)     Past Surgical History:  Procedure Laterality Date  . CARDIAC CATHETERIZATION    . CARDIOVERSION N/A 08/27/2016   Procedure: CARDIOVERSION;  Surgeon: Dolores PattyBensimhon, Daniel R, MD;  [facility];  Service: Cardiovascular;  Laterality: N/A;  . DIAGNOSTIC LAPAROSCOPY    . ICD GENERATOR CHANGEOUT N/A 07/20/2016   Procedure: ICD Generator Changeout;  Surgeon: Marinus Mawaylor, Gregg W, MD;  [facility];  Service: Cardiovascular;  Laterality: N/A;  . INSERT / REPLACE / REMOVE PACEMAKER    . RIGHT HEART CATHETERIZATION N/A 07/23/2013   Procedure: RIGHT HEART CATH;  Surgeon: Dolores Pattyaniel R Bensimhon, MD;  [facility];  Service: Cardiovascular;  Laterality: N/A;  . TEE WITHOUT CARDIOVERSION N/A 08/27/2016   Procedure: TRANSESOPHAGEAL ECHOCARDIOGRAM (TEE);  Surgeon: Dolores PattyBensimhon, Daniel R, MD;  [facility];  Service: Cardiovascular;  Laterality: N/A;  . TUBAL LIGATION      Allergies: Nyquil multi-symptom [pseudoeph-doxylamine-dm-apap]; Sudafed [pseudoephedrine]; Lasix [furosemide]; Torsemide; Zocor [simvastatin]; Latex; and Ramipril  Medications: Prior to Admission medications   Medication Sig Start Date End Date Taking? Authorizing Provider  acetaminophen (TYLENOL) 500 MG tablet Take 1,000 mg by mouth every 6 (six)  hours as needed (pain).    Yes [provider]  albuterol (PROVENTIL) (2.5 MG/3ML) 0.083% nebulizer solution Take 2.5 mg by nebulization every 6 (six) hours as needed for wheezing or shortness of breath.   Yes [provider]  aspirin EC 81 MG tablet Take 1 tablet (81 mg total) by mouth every evening. Resume aspirin 1 week following discharge. 07/21/16  Yes Duke, Roe Rutherford, PA  digoxin  (LANOXIN) 0.125 MG tablet Take 1 tablet (0.125 mg total) by mouth every evening. 08/13/16  Yes Bensimhon, Bevelyn Buckles, MD  ethacrynic acid (EDECRIN) 25 MG tablet Take 6 tablets (150 mg total) by mouth 2 (two) times daily. 06/06/16  Yes Bensimhon, Bevelyn Buckles, MD  insulin glargine (LANTUS) 100 UNIT/ML injection Inject 5 Units into the skin daily as needed (if blood sugar >200).   Yes [provider]  magnesium oxide (MAG-OX) 400 (241.3 Mg) MG tablet TAKE 1/2 TABLET (200MG ) EVERY DAY IN THE EVENING 08/10/16  Yes Bensimhon, Bevelyn Buckles, MD  metolazone (ZAROXOLYN) 2.5 MG tablet Take once daily as needed (3 x max per week) for weight gain/swelling. Take extra 20 meq potassium pill with this. 08/13/16  Yes Bensimhon, Bevelyn Buckles, MD  potassium chloride SA (KLOR-CON M20) 20 MEQ tablet Take 40 mg (2 tabs) in am and 20 meq (1 tab) in pm 11/28/15  Yes Bensimhon, Bevelyn Buckles, MD  spironolactone (ALDACTONE) 25 MG tablet TAKE 1 TABLET (25 MG TOTAL) BY MOUTH DAILY EVERY EVENING 08/13/16  Yes Bensimhon, Bevelyn Buckles, MD     Family History  Problem Relation Age of Onset  . Coronary artery disease Unknown        FMILY HISTORY    Social History   Social History  . Marital status: Divorced    Spouse name: N/A  . Number of children: N/A  . Years of education: N/A   Occupational History  . disabled Disability   Social History Main Topics  . Smoking status: Current Some Day Smoker    Packs/day: 0.25    Years: 21.00    Types: Cigars  . Smokeless tobacco: Never Used     Comment: Smokes 2-3 cigars daily, quit smoking cigarettes 3 yrs ago.   . Alcohol use No  . Drug use: No  . Sexual activity: No   Other Topics Concern  . None   Social History Narrative  . None    Review of Systems: A 12 point ROS discussed and pertinent positives are indicated in the HPI above.  All other systems are negative.  Review of Systems  Constitutional: Positive for activity change and fatigue. Negative for appetite change and fever.    Respiratory: Positive for shortness of breath.   Cardiovascular: Negative for chest pain.  Gastrointestinal: Negative for abdominal pain and nausea.  Neurological: Positive for weakness.  Psychiatric/Behavioral: Negative for behavioral problems and confusion.    Vital Signs: BP 105/61 (BP Location: Left Arm)   Pulse (!) 50   Temp 98.6 F (37 C) (Oral)   Resp (!) 25   Ht 5\' 5"  (1.651 m)   Wt 121 lb (54.9 kg)   SpO2 95%   BMI 20.14 kg/m   Physical Exam  Constitutional: She is oriented to person, place, and time.  Cardiovascular: Normal rate and regular rhythm.   Pulmonary/Chest: Effort normal and breath sounds normal. She has no wheezes.  Abdominal: Soft. Bowel sounds are normal. There is no tenderness.  Musculoskeletal: Normal range of motion.  Neurological: She is alert and oriented  to person, place, and time.  Skin: Skin is warm and dry.  Psychiatric: She has a normal mood and affect. Her behavior is normal. Judgment and thought content normal.  Nursing note and vitals reviewed.   Mallampati Score:  MD Evaluation Airway: WNL Heart: WNL Heart  comments: EF 15% Abdomen: WNL Chest/ Lungs: WNL ASA  Classification: 3 Mallampati/Airway Score: One  Imaging: Dg Chest 2 View  Result Date: 08/22/2016 CLINICAL DATA:  Short of breath EXAM: CHEST  2 VIEW COMPARISON:  07/21/2016 FINDINGS: Cardiac enlargement unchanged. AICD with 2 leads extending to the right ventricle are unchanged. Mild vascular congestion without edema or effusion. Negative for pneumonia IMPRESSION: Cardiac enlargement with vascular congestion.  Negative for edema. Electronically Signed   By: Marlan Palau M.D.   On: 08/22/2016 16:22   Dg Chest Port 1 View  Result Date: 08/24/2016 CLINICAL DATA:  Confirm PICC line EXAM: PORTABLE CHEST 1 VIEW COMPARISON:  None. FINDINGS: Stable cardiomegaly. Mild vascular congestion. PICC line tip from right-sided approach is noted at the cavoatrial junction. Left-sided ICD  device projects over the left shoulder with leads in the right ventricle. No acute nor suspicious osseous abnormality. IMPRESSION: 1. Right-sided PICC line is noted with tip at the cavoatrial junction. 2. Stable cardiomegaly with mild vascular congestion. Electronically Signed   By: Tollie Eth M.D.   On: 08/24/2016 20:08    Labs:  CBC:  Recent Labs  09/01/16 0529 09/02/16 0438 09/03/16 0210 09/04/16 0355  WBC 5.7 5.7 6.1 5.9  HGB 10.4* 9.9* 10.6* 10.9*  HCT 31.1* 30.3* 31.7* 32.9*  PLT 126* 128* 138* 158    COAGS:  Recent Labs  09/28/15 1148 10/06/15 1146 07/12/16 1328  08/31/16 0500 09/01/16 0529 09/02/16 0438 09/02/16 0734  INR 2.4 5.4 1.1  --   --   --   --   --   APTT  --   --   --   < > 74* 67* >200* 61*  < > = values in this interval not displayed.  BMP:  Recent Labs  09/02/16 1600 09/03/16 0210 09/03/16 1628 09/04/16 0355  NA 129* 131* 127* 129*  K 4.5 4.6 4.3 4.3  CL 96* 97* 94* 96*  CO2 26 26 25 24   GLUCOSE 168* 152* 351* 184*  BUN 10 12 10 16   CALCIUM 8.5* 8.8* 8.2* 8.7*  CREATININE 1.70* 1.74* 1.87* 2.08*  GFRNONAA 32* 31* 29* 25*  GFRAA 37* 36* 33* 29*    LIVER FUNCTION TESTS:  Recent Labs  09/02/16 1600 09/03/16 0210 09/03/16 1628 09/04/16 0355  ALBUMIN 3.1* 3.0* 2.9* 3.2*    TUMOR MARKERS: No results for input(s): AFPTM, CEA, CA199, CHROMGRNA in the last 8760 hours.  Assessment and Plan:  A/C RF Cardio-renal syndrome Temp cath in place Rt fem Vein- in use Scheduled for tunneled dialysis catheter placement 6/27 Risks and Benefits discussed with the patient including, but not limited to bleeding, infection, vascular injury, pneumothorax which may require chest tube placement, air embolism or even death All of the patient's questions were answered, patient is agreeable to proceed. Consent signed and in chart.   Thank you for this interesting consult.  I greatly enjoyed meeting Jessica [name] and look forward to participating  in their care.  A copy of this report was sent to the requesting provider on this date.  Electronically Signed: Robet Leu, PA-C 09/04/2016, 1:44 PM   I spent a total of 20 Minutes    in face to face in clinical  consultation, greater than 50% of which was counseling/coordinating care for tunneled HD catheter placement

## 2016-09-04 NOTE — Plan of Care (Signed)
Problem: Education: Goal: Understanding of medication regimen will improve Outcome: Progressing Hemodynamics maintained with levophed,amiodarone,and milrinone

## 2016-09-04 NOTE — Progress Notes (Signed)
Assessment/ Plan:   1. Acute oliguric kidney injury: secondary to decompensated HF. Remains on pressors and bradycardicContinue CRRT today;  fluid removal rate now at 100cc/min  [facility]  2  Acute decompensated HF/ cardiogenic shock: on milrinone and levophed.   3. New Aflutter: s/p successful DCCV. In NSR.  4. PAH: on sildenafil 20 mg TID.  5. h/o LV thrombus: on Eliquis as outpatient; on systemic heparin.   6. Hyponatremia: improving  Subjective: Interval History: Not much progress  Objective: Vital signs in last 24 hours: Temp:  [98.5 F (36.9 C)-98.9 F (37.2 C)] 98.6 F (37 C) (06/26 1158) Pulse Rate:  [46-64] 50 (06/26 1158) Resp:  [11-26] 25 (06/26 1158) BP: (81-125)/(46-94) 105/61 (06/26 1158) SpO2:  [91 %-100 %] 95 % (06/26 1158) Weight:  [54.9 kg (121 lb)] 54.9 kg (121 lb) (06/26 0407) Weight change: -0.816 kg (-1 lb 12.8 oz)  Intake/Output from previous day: 06/25 0701 - 06/26 0700 In: 1739.9 [P.O.:820; I.V.:919.9] Out: 5231 [Urine:285] Intake/Output this shift: Total I/O In: 153.6 [I.V.:153.6] Out: 846 [Other:846]  General appearance: alert and cooperative Resp: mild basilar coarse BS Chest wall: no tenderness Cardio: regular rate and rhythm, S1, S2 normal, no murmur, click, rub or gallop GI: RUQ tenderness and pos HJR Extremities: extremities normal, atraumatic, no cyanosis or edema  Sl diminished turgor  Lab Results:  Recent Labs  09/03/16 0210 09/04/16 0355  WBC 6.1 5.9  HGB 10.6* 10.9*  HCT 31.7* 32.9*  PLT 138* 158   BMET:  Recent Labs  09/03/16 1628 09/04/16 0355  NA 127* 129*  K 4.3 4.3  CL 94* 96*  CO2 25 24  GLUCOSE 351* 184*  BUN 10 16  CREATININE 1.87* 2.08*  CALCIUM 8.2* 8.7*   No results for input(s): PTH in the last 72 hours. Iron Studies: No results for input(s): IRON, TIBC, TRANSFERRIN, FERRITIN in the last 72 hours. Studies/Results: No results found.  Scheduled: . aspirin EC  81  mg Oral Daily  . Chlorhexidine Gluconate Cloth  6 each Topical Q0600  . insulin aspart  0-15 Units Subcutaneous TID WC  . insulin glargine  5 Units Subcutaneous Daily  . mouth rinse  15 mL Mouth Rinse BID  . pravastatin  20 mg Oral q1800  . sildenafil  20 mg Oral TID  . sodium chloride flush  10-40 mL Intracatheter Q12H   Continuous: . amiodarone 30 mg/hr (09/04/16 0700)  . heparin 1,050 Units/hr (09/04/16 0700)  . milrinone 0.375 mcg/kg/min (09/04/16 0700)  . norepinephrine (LEVOPHED) Adult infusion 5 mcg/min (09/04/16 0700)  . dialysis replacement fluid (prismasate) 300 mL/hr at 09/04/16 0308  . dialysis replacement fluid (prismasate) 300 mL/hr at 09/04/16 0308  . dialysate (PRISMASATE) 1,000 mL/hr at 09/04/16 0757  . sodium chloride 999 mL/hr at 08/29/16 1307       LOS: 13 days   Carloyn Lahue C 09/04/2016,12:50 PM

## 2016-09-04 NOTE — Progress Notes (Signed)
Patient ID: Jessica MoccasinSherita [name], female   DOB: 1960-04-29, 56 y.o.   MRN: [id number]   Advanced Heart Failure Rounding Note   Subjective:    Admitted with new onset atrial flutter and ADHF. Milrinone started 6/15 for worsening HF and renal failure.  S/P TEE and successful DC-CV.   Moved to ICU on 6/19 due to low output and worsening AKI. CVVHD catheter placed. Remains on CVVHD. Pulling 150 cc per hour. Urine output 250cc.    Remains on norepi 5 mcg + milrinone 0.375 mcg. CO-OX up to 63%. CVP 14. Had BM last night.   Feeling better. Denies SOB. Wants to get OOB.      Objective:   Weight Range:  Vital Signs:   Temp:  [97.8 F (36.6 C)-98.9 F (37.2 C)] 98.8 F (37.1 C) (06/26 0407) Pulse Rate:  [47-64] 55 (06/26 0700) Resp:  [11-26] 21 (06/26 0700) BP: (81-125)/(46-94) 87/46 (06/26 0700) SpO2:  [91 %-100 %] 91 % (06/26 0700) Weight:  [121 lb (54.9 kg)] 121 lb (54.9 kg) (06/26 0407) Last BM Date: 09/01/16  Weight change: Filed Weights   09/03/16 0313 09/03/16 0600 09/04/16 0407  Weight: 122 lb 12.8 oz (55.7 kg) 124 lb 1.9 oz (56.3 kg) 121 lb (54.9 kg)    Intake/Output:   Intake/Output Summary (Last 24 hours) at 09/04/16 0708 Last data filed at 09/04/16 0700  Gross per 24 hour  Intake          1701.51 ml  Output             5231 ml  Net         -3529.49 ml     Physical Exam: CVP 12 General:  Lying in bed,. weak appearing. No resp difficulty. NAD.  HEENT: normal Neck: supple. JVP to jaw. Carotids 2+ bilat; no bruits. No lymphadenopathy or thryomegaly appreciated. Cor: PMI nondisplaced. Regular rate & rhythm. +s3 Lungs: clear Abdomen: soft, nontender, nondistended. No hepatosplenomegaly. No bruits or masses. Good bowel sounds. Extremities: no cyanosis, clubbing, rash, edema. RUE PICC R femoral HD cath.  Neuro: alert & orientedx3, cranial nerves grossly intact. moves all 4 extremities w/o difficulty. Affect pleasant  Telemetry: Personally reviewed. NSR 50-60s.       Labs: Basic Metabolic Panel:  Recent Labs Lab 08/30/16 0335  08/31/16 0500  09/01/16 0529  09/02/16 0438 09/02/16 1600 09/03/16 0210 09/03/16 1628 09/04/16 0355  NA 131*  130*  < > 134*  < >  --   < > 128* 129* 131* 127* 129*  K 4.8  4.8  < > 4.6  < >  --   < > 4.3 4.5 4.6 4.3 4.3  CL 97*  98*  < > 99*  < >  --   < > 96* 96* 97* 94* 96*  CO2 23  23  < > 26  < >  --   < > 25 26 26 25 24   GLUCOSE 119*  117*  < > 95  < >  --   < > 260* 168* 152* 351* 184*  BUN 38*  39*  < > 17  < >  --   < > 12 10 12 10 16   CREATININE 2.78*  2.82*  < > 1.74*  < >  --   < > 1.82* 1.70* 1.74* 1.87* 2.08*  CALCIUM 8.5*  8.6*  < > 8.7*  < >  --   < > 8.5* 8.5* 8.8* 8.2* 8.7*  MG 2.5*  --  2.6*  --  2.4  --   --   --  2.6*  --  2.6*  PHOS 2.4*  < > 1.9*  < >  --   < > 1.9* 1.7* 1.7* 1.8* 1.9*  < > = values in this interval not displayed.  Liver Function Tests:  Recent Labs Lab 09/02/16 0438 09/02/16 1600 09/03/16 0210 09/03/16 1628 09/04/16 0355  ALBUMIN 2.9* 3.1* 3.0* 2.9* 3.2*   No results for input(s): LIPASE, AMYLASE in the last 168 hours. No results for input(s): AMMONIA in the last 168 hours.  CBC:  Recent Labs Lab 08/29/16 0435  08/31/16 0500 09/01/16 0529 09/02/16 0438 09/03/16 0210 09/04/16 0355  WBC 5.3  < > 5.1 5.7 5.7 6.1 5.9  NEUTROABS 3.5  --   --   --   --   --   --   HGB 11.3*  < > 11.3* 10.4* 9.9* 10.6* 10.9*  HCT 33.4*  < > 33.5* 31.1* 30.3* 31.7* 32.9*  MCV 81.5  < > 82.3 83.2 83.5 83.2 83.9  PLT 112*  < > 128* 126* 128* 138* 158  < > = values in this interval not displayed.  Cardiac Enzymes: No results for input(s): CKTOTAL, CKMB, CKMBINDEX, TROPONINI in the last 168 hours.  BNP: BNP (last 3 results)  Recent Labs  09/28/15 1114  BNP 871.3*    ProBNP (last 3 results) No results for input(s): PROBNP in the last 8760 hours.    Other results:  Imaging: No results found.   Medications:     Scheduled Medications: . aspirin EC  81 mg  Oral Daily  . Chlorhexidine Gluconate Cloth  6 each Topical Q0600  . insulin aspart  0-9 Units Subcutaneous TID WC  . insulin glargine  5 Units Subcutaneous Daily  . mouth rinse  15 mL Mouth Rinse BID  . pravastatin  20 mg Oral q1800  . sildenafil  20 mg Oral TID  . sodium chloride flush  10-40 mL Intracatheter Q12H    Infusions: . amiodarone 30 mg/hr (09/04/16 0600)  . heparin 1,050 Units/hr (09/04/16 0600)  . milrinone 0.375 mcg/kg/min (09/04/16 0600)  . norepinephrine (LEVOPHED) Adult infusion 5 mcg/min (09/04/16 0600)  . dialysis replacement fluid (prismasate) 300 mL/hr at 09/04/16 0308  . dialysis replacement fluid (prismasate) 300 mL/hr at 09/04/16 0308  . dialysate (PRISMASATE) 1,000 mL/hr at 09/04/16 0258  . sodium chloride 999 mL/hr at 08/29/16 1307    PRN Medications: acetaminophen, albuterol, benzonatate, chlorpheniramine-HYDROcodone, docusate sodium, heparin, ondansetron (ZOFRAN) IV, sodium chloride, sodium chloride flush   Assessment/Plan/Discussion:   Frayda is a 56 year old with h/o chronic systolic heart failure, ICM, Biotronik ICD, and PAH admitted today with new onset A Fib/Flutter  1. A flutter RVR: New onset June 11 with device interrogation. S/P TEE DC-CV with conversion to NSR.  -Maintaining NSR. Continue amio while on inotropes.  On heparin drip.    Discussed with Pharm D  2. A/C Systolic Biventricular Heart Failure/cardiogenic shock: Ischemic cardiomyopathy.  Echo 08/24/16. LVEF 10% severe RV HK (EF down further in setting of AFL).   Remains on dual inotropes, milrinone 0.375 mcg + 5 mcg norepi. Improved CO-oX 63%.   - No bb or ACE/ARB/ARNI with low output and AKI .  - Continues on CVVH pulling 200 per hour.   .  If she is not able to effectively come off CVVH/HD, will need to consider hospice care.  3. Acute on chronic renal failure (baseline CKD 3) due to cardio-renal syndrome.  CVVHD  started 6/20. Continue to pull 200 per hour. CVP 14.  With RV  failure may not be able to get much dryer. Not a candidate for long term HD.  4. PAH - Continue sildenafil 20 mg TID for now.  5. CAD S/P multiple interventions. No S/S ischemia  - Continues asa.  No nitro with sildenafil.  - She was unable to take simvastatin due to myalgias. Continue pravastatin.   6. PAD: occluded left SFA with mild/mod abnormal ABIs. No revascularization was needed October 2017. No change.  7. H/O LV Thrombus: no thrombus on TEE. No change 8. Hyponatremia: sodium trending down 131>129.  Fluid restrict.    Amy Clegg NP-C  09/04/2016 7:08 AM  Agree.   She remains on CVVHD and dual pressures. Keeping MAPs 65-70s. Weight down another 3 pounds. Only about 250cc urine output yesterday. Co-ox 63%. Maintaining NSR.  On exam  Lying in bed NAD JVP to jaw Cor RRR +s3 excoriation over ICD site Lungs clear Ab soft NT/ND Ext no edema  R femoral trialysis cath   Remains very tenuous on dual pressors. Urine output minimal. Will continue pressors to keep MAP > 70 and co-ox >= 55%. She is very close to baseline weight and CVP 11-12 range. Will decrease CVVHD rate to 100/hr for now (await Renal input as well) - given severity of RV failure would like to keep CVP from going below 10 to optimize chance of renal recvoery.   CRITICAL CARE Performed by: Arvilla Meres  Total critical care time: 35 minutes  Critical care time was exclusive of separately billable procedures and treating other patients.  Critical care was necessary to treat or prevent imminent or life-threatening deterioration.  Critical care was time spent personally by me (independent of midlevel providers or residents) on the following activities: development of treatment plan with patient and/or surrogate as well as nursing, discussions with consultants, evaluation of patient's response to treatment, examination of patient, obtaining history from patient or surrogate, ordering and performing treatments and  interventions, ordering and review of laboratory studies, ordering and review of radiographic studies, pulse oximetry and re-evaluation of patient's condition.  Arvilla Meres, MD  8:55 AM

## 2016-09-05 ENCOUNTER — Inpatient Hospital Stay (HOSPITAL_COMMUNITY): Payer: Medicare Other

## 2016-09-05 ENCOUNTER — Encounter (HOSPITAL_COMMUNITY): Payer: Self-pay | Admitting: Interventional Radiology

## 2016-09-05 HISTORY — PX: IR US GUIDE VASC ACCESS RIGHT: IMG2390

## 2016-09-05 HISTORY — PX: IR FLUORO GUIDE CV LINE RIGHT: IMG2283

## 2016-09-05 LAB — GLUCOSE, CAPILLARY
GLUCOSE-CAPILLARY: 162 mg/dL — AB (ref 65–99)
GLUCOSE-CAPILLARY: 133 mg/dL — AB (ref 65–99)
Glucose-Capillary: 168 mg/dL — ABNORMAL HIGH (ref 65–99)
Glucose-Capillary: 210 mg/dL — ABNORMAL HIGH (ref 65–99)

## 2016-09-05 LAB — RENAL FUNCTION PANEL
ANION GAP: 7 (ref 5–15)
Albumin: 3 g/dL — ABNORMAL LOW (ref 3.5–5.0)
Albumin: 2.9 g/dL — ABNORMAL LOW (ref 3.5–5.0)
Anion gap: 8 (ref 5–15)
BUN: 13 mg/dL (ref 6–20)
BUN: 20 mg/dL (ref 6–20)
CALCIUM: 8.9 mg/dL (ref 8.9–10.3)
CHLORIDE: 99 mmol/L — AB (ref 101–111)
CHLORIDE: 96 mmol/L — AB (ref 101–111)
CO2: 25 mmol/L (ref 22–32)
CO2: 23 mmol/L (ref 22–32)
CREATININE: 2.46 mg/dL — AB (ref 0.44–1.00)
Calcium: 8.8 mg/dL — ABNORMAL LOW (ref 8.9–10.3)
Creatinine, Ser: 1.71 mg/dL — ABNORMAL HIGH (ref 0.44–1.00)
GFR calc Af Amer: 24 mL/min — ABNORMAL LOW (ref >60)
GFR calc non Af Amer: 21 mL/min — ABNORMAL LOW (ref >60)
GFR, EST AFRICAN AMERICAN: 37 mL/min — AB (ref >60)
GFR, EST NON AFRICAN AMERICAN: 32 mL/min — AB (ref >60)
Glucose, Bld: 179 mg/dL — ABNORMAL HIGH (ref 65–99)
Glucose, Bld: 244 mg/dL — ABNORMAL HIGH (ref 65–99)
Phosphorus: 2 mg/dL — ABNORMAL LOW (ref 2.5–4.6)
Phosphorus: 2.3 mg/dL — ABNORMAL LOW (ref 2.5–4.6)
Potassium: 4.5 mmol/L (ref 3.5–5.1)
Potassium: 4.9 mmol/L (ref 3.5–5.1)
Sodium: 131 mmol/L — ABNORMAL LOW (ref 135–145)
Sodium: 127 mmol/L — ABNORMAL LOW (ref 135–145)

## 2016-09-05 LAB — CBC
HEMATOCRIT: 35 % — AB (ref 36.0–46.0)
HEMOGLOBIN: 12 g/dL (ref 12.0–15.0)
MCH: 28.7 pg (ref 26.0–34.0)
MCHC: 34.3 g/dL (ref 30.0–36.0)
MCV: 83.7 fL (ref 78.0–100.0)
Platelets: 177 10*3/uL (ref 150–400)
RBC: 4.18 MIL/uL (ref 3.87–5.11)
RDW: 17.6 % — ABNORMAL HIGH (ref 11.5–15.5)
WBC: 5.4 10*3/uL (ref 4.0–10.5)

## 2016-09-05 LAB — MAGNESIUM
MAGNESIUM: 2.6 mg/dL — AB (ref 1.7–2.4)
Magnesium: 2.6 mg/dL — ABNORMAL HIGH (ref 1.7–2.4)

## 2016-09-05 LAB — COOXEMETRY PANEL
CARBOXYHEMOGLOBIN: 1.5 % (ref 0.5–1.5)
METHEMOGLOBIN: 1.1 % (ref 0.0–1.5)
O2 SAT: 59 %
TOTAL HEMOGLOBIN: 11.8 g/dL — AB (ref 12.0–16.0)

## 2016-09-05 LAB — PROTIME-INR
INR: 1.05
PROTHROMBIN TIME: 13.7 s (ref 11.4–15.2)

## 2016-09-05 LAB — HEPARIN LEVEL (UNFRACTIONATED): HEPARIN UNFRACTIONATED: 0.18 [IU]/mL — AB (ref 0.30–0.70)

## 2016-09-05 MED ORDER — HEPARIN SODIUM (PORCINE) 1000 UNIT/ML IJ SOLN
INTRAMUSCULAR | Status: AC
  Filled 2016-09-05: qty 1

## 2016-09-05 MED ORDER — LIDOCAINE HCL (PF) 1 % IJ SOLN
INTRAMUSCULAR | Status: AC
  Filled 2016-09-05: qty 30

## 2016-09-05 MED ORDER — SODIUM CHLORIDE 0.9 % IV BOLUS (SEPSIS)
250.0000 mL | Freq: Once | INTRAVENOUS | Status: AC
  Administered 2016-09-05: 250 mL via INTRAVENOUS

## 2016-09-05 NOTE — Progress Notes (Signed)
ANTICOAGULATION CONSULT NOTE - Follow Up Consult  Pharmacy Consult for Heparin Indication: atrial fibrillation   Assessment: 56yof on eliquis for afib, s/p DCCV on 6/18, transitioned to IV heparin 6/20 anticipating CVVHD. Heparin level is down this morning at 0.18. Hgb improved overnight, platelets count continues to improve. No bleeding noted on rounds.  Goal of Therapy:  Heparin level 0.3-0.7 units/ml Monitor platelets by anticoagulation protocol: Yes   Plan:  1) Increase heparin to 1200 units/hr, recheck level this afternoon 2) Daily heparin level and CBC  Patient Measurements: Height: 5\' 5"  (165.1 cm) Weight: 119 lb 0.8 oz (54 kg) IBW/kg (Calculated) : 57  Vital Signs: Temp: 97.8 F (36.6 C) (06/27 0728) Temp Source: Oral (06/27 0728) BP: 107/57 (06/27 0810) Pulse Rate: 52 (06/27 0810)  Labs:  Recent Labs  09/03/16 0210  09/04/16 0355 09/04/16 1713 09/05/16 0428 09/05/16 0628  HGB 10.6*  --  10.9*  --  12.0  --   HCT 31.7*  --  32.9*  --  35.0*  --   PLT 138*  --  158  --  177  --   LABPROT  --   --   --   --  13.7  --   INR  --   --   --   --  1.05  --   HEPARINUNFRC 0.31  --  0.34  --  0.18*  --   CREATININE 1.74*  < > 2.08* 1.70*  --  1.71*  < > = values in this interval not displayed.  Estimated Creatinine Clearance: 30.6 mL/min (A) (by C-G formula based on SCr of 1.71 mg/dL (H)).  Medications: Heparin @ 1050 units/hr  Sheppard CoilFrank Larico Dimock PharmD., BCPS Clinical Pharmacist Pager [phone] 09/05/2016 8:29 AM

## 2016-09-05 NOTE — Procedures (Signed)
RIJV temp HD cath SVC RA EBL 0 Comp 0  

## 2016-09-05 NOTE — Progress Notes (Signed)
Patient ID: Jessica [name], female   DOB: March 14, 1960, 56 y.o.   MRN: [id number]   Advanced Heart Failure Rounding Note   Subjective:    Admitted with new onset atrial flutter and ADHF. Milrinone started 6/15 for worsening HF and renal failure.  S/P TEE and successful DC-CV.   Moved to ICU on 6/19 due to low output and worsening AKI. CVVHD catheter placed. Remains on CVVHD. Pulling 150 cc per hour. Urine output 250cc.    Continues on norepi 5 mcg + milrinone 0.375 mcg. CO-OX 59%. CVP 5. Denies dizziness. 25 cc urine output.   Denies SOB. Wants to get oob.    Objective:   Weight Range:  Vital Signs:   Temp:  [97.6 F (36.4 C)-98.6 F (37 C)] 98.4 F (36.9 C) (06/27 0300) Pulse Rate:  [46-58] 51 (06/27 0500) Resp:  [14-26] 19 (06/27 0500) BP: (82-115)/(48-74) 82/54 (06/27 0500) SpO2:  [88 %-100 %] 92 % (06/27 0500) Weight:  [119 lb 0.8 oz (54 kg)] 119 lb 0.8 oz (54 kg) (06/27 0400) Last BM Date: 09/04/16  Weight change: Filed Weights   09/03/16 0600 09/04/16 0407 09/05/16 0400  Weight: 124 lb 1.9 oz (56.3 kg) 121 lb (54.9 kg) 119 lb 0.8 oz (54 kg)    Intake/Output:   Intake/Output Summary (Last 24 hours) at 09/05/16 8295 Last data filed at 09/05/16 0600  Gross per 24 hour  Intake           1314.6 ml  Output             3318 ml  Net          -2003.4 ml     Physical Exam: CVP 5  General:  Chronically ill. No resp difficulty. In bed.  HEENT: normal Neck: supple. JVP 5-6. Carotids 2+ bilat; no bruits. No lymphadenopathy or thryomegaly appreciated. Cor: PMI nondisplaced. Regular rate & rhythm. No rubs, gallops or murmurs. Lungs: clear Abdomen: soft, nontender, nondistended. No hepatosplenomegaly. No bruits or masses. Good bowel sounds. Extremities: no cyanosis, clubbing, rash, edema. R groin HD cath  Neuro: alert & orientedx3, cranial nerves grossly intact. moves all 4 extremities w/o difficulty. Affect flat  Telemetry: Personally reviewed. NSR 50-60s.        Labs: Basic Metabolic Panel:  Recent Labs Lab 08/31/16 0500  09/01/16 0529  09/02/16 1600 09/03/16 0210 09/03/16 1628 09/04/16 0355 09/04/16 1713 09/05/16 0428  NA 134*  < >  --   < > 129* 131* 127* 129* 129*  --   K 4.6  < >  --   < > 4.5 4.6 4.3 4.3 4.7  --   CL 99*  < >  --   < > 96* 97* 94* 96* 97*  --   CO2 26  < >  --   < > 26 26 25 24 25   --   GLUCOSE 95  < >  --   < > 168* 152* 351* 184* 197*  --   BUN 17  < >  --   < > 10 12 10 16 16   --   CREATININE 1.74*  < >  --   < > 1.70* 1.74* 1.87* 2.08* 1.70*  --   CALCIUM 8.7*  < >  --   < > 8.5* 8.8* 8.2* 8.7* 8.8*  --   MG 2.6*  --  2.4  --   --  2.6*  --  2.6*  --  2.6*  PHOS 1.9*  < >  --   < >  1.7* 1.7* 1.8* 1.9* 1.7*  --   < > = values in this interval not displayed.  Liver Function Tests:  Recent Labs Lab 09/02/16 1600 09/03/16 0210 09/03/16 1628 09/04/16 0355 09/04/16 1713  ALBUMIN 3.1* 3.0* 2.9* 3.2* 3.0*   No results for input(s): LIPASE, AMYLASE in the last 168 hours. No results for input(s): AMMONIA in the last 168 hours.  CBC:  Recent Labs Lab 09/01/16 0529 09/02/16 0438 09/03/16 0210 09/04/16 0355 09/05/16 0428  WBC 5.7 5.7 6.1 5.9 5.4  HGB 10.4* 9.9* 10.6* 10.9* 12.0  HCT 31.1* 30.3* 31.7* 32.9* 35.0*  MCV 83.2 83.5 83.2 83.9 83.7  PLT 126* 128* 138* 158 177    Cardiac Enzymes: No results for input(s): CKTOTAL, CKMB, CKMBINDEX, TROPONINI in the last 168 hours.  BNP: BNP (last 3 results)  Recent Labs  09/28/15 1114  BNP 871.3*    ProBNP (last 3 results) No results for input(s): PROBNP in the last 8760 hours.    Other results:  Imaging: No results found.   Medications:     Scheduled Medications: . aspirin EC  81 mg Oral Daily  . Chlorhexidine Gluconate Cloth  6 each Topical Q0600  . insulin aspart  0-15 Units Subcutaneous TID WC  . insulin glargine  5 Units Subcutaneous Daily  . mouth rinse  15 mL Mouth Rinse BID  . pravastatin  20 mg Oral q1800  . sildenafil   20 mg Oral TID  . sodium chloride flush  10-40 mL Intracatheter Q12H    Infusions: . amiodarone 30 mg/hr (09/05/16 0500)  .  ceFAZolin (ANCEF) IV    . heparin 1,050 Units/hr (09/05/16 1610)  . milrinone 0.375 mcg/kg/min (09/05/16 0500)  . norepinephrine (LEVOPHED) Adult infusion 5 mcg/min (09/05/16 0500)  . dialysis replacement fluid (prismasate) 300 mL/hr at 09/04/16 2000  . dialysis replacement fluid (prismasate) 300 mL/hr at 09/04/16 2000  . dialysate (PRISMASATE) 1,000 mL/hr at 09/05/16 0515  . sodium chloride 999 mL/hr at 08/29/16 1307    PRN Medications: acetaminophen, albuterol, benzonatate, chlorpheniramine-HYDROcodone, docusate sodium, heparin, hydrOXYzine, mineral oil-hydrophilic petrolatum, ondansetron (ZOFRAN) IV, sodium chloride, sodium chloride flush   Assessment/Plan/Discussion:   Jessica [name] is a 56 year old with h/o chronic systolic heart failure, ICM, Biotronik ICD, and PAH admitted today with new onset A Fib/Flutter  1. A flutter RVR: New onset June 11 with device interrogation. S/P TEE DC-CV with conversion to NSR.  -Maintaining SR.  Continue amio while on inotropes.  On heparin drip.    Discussed with Pharm D  2. A/C Systolic Biventricular Heart Failure/cardiogenic shock: Ischemic cardiomyopathy.  Echo 08/24/16. LVEF 10% severe RV HK (EF down further in setting of AFL).   Remains on dual inotropes, milrinone 0.375 mcg + 5 mcg norepi.  Todays Co-ox is 59%.  CVP down to 5.    - No bb or ACE/ARB/ARNI with low output and AKI .  - Continues on CVVH pulling 100 cc per hour. CVP 5. Run even and will stop for tunneled cath today.   3. Acute on chronic renal failure (baseline CKD 3) due to cardio-renal syndrome.  CVVHD started 6/20.  Continues to pull 100 per hour but with CVP down to 5. Run even. Plan for tunneled HD cath today in IR. With RV failure may not be able to get much dryer. Not a candidate for long term HD.  4. PAH - Continue sildenafil 20 mg TID for now.  5.  CAD S/P multiple interventions. No S/S ischemia  -  Continues asa.  No nitro with sildenafil.  - She was unable to take simvastatin due to myalgias. Continue pravastatin.   6. PAD: occluded left SFA with mild/mod abnormal ABIs. No revascularization was needed October 2017. No change.  7. H/O LV Thrombus: no thrombus on TEE. No change 8. Hyponatremia: sodium pending.   BMET re-drawn.     Amy Clegg NP-C  09/05/2016 7:02 AM  Agree with above.  She feels weak this am. CVP low. BP soft despite dual pressors/inotropes. Weight down 16 pounds total. Now 119 pounds  Remains in NSR. Scheduled for tunneled perm cath today  On exam   Lying in bed  Frail Cor RRR +s3 Lungs clear Ab soft nt Ext warm RFV trialysis cath  She remains nearly anuric. Volume status low and BP soft despite dual inotropes. Will give some fluid back. Given end-stage HF not candidate for long-term HD> I spoke with Dr. Lowell GuitarPowell and IR personally. Will not place perm cath but place temporary IJ cath instead.   I reiterated to her that if kidneys don't recover, Palliative Care is only option. Son asking about heart/kidney transplant but she is not a candidate due to comorbidities including tobacco use and PAD. May benefit from Palliative Care consult for GOC now.   CRITICAL CARE Performed by: Arvilla MeresBensimhon, Tyrese Capriotti  Total critical care time: 35 minutes  Critical care time was exclusive of separately billable procedures and treating other patients.  Critical care was necessary to treat or prevent imminent or life-threatening deterioration.  Critical care was time spent personally by me (independent of midlevel providers or residents) on the following activities: development of treatment plan with patient and/or surrogate as well as nursing, discussions with consultants, evaluation of patient's response to treatment, examination of patient, obtaining history from patient or surrogate, ordering and performing treatments and  interventions, ordering and review of laboratory studies, ordering and review of radiographic studies, pulse oximetry and re-evaluation of patient's condition.  Arvilla MeresBensimhon, Bevan Vu, MD  6:03 PM

## 2016-09-05 NOTE — Progress Notes (Signed)
Pt brought to IR for temp dialysis cath placement. Awake and alert. In no distress. Sa02 86-87. Placed on 2L [place] with sa02 95.

## 2016-09-05 NOTE — Progress Notes (Signed)
Assessment/ Plan:   1. Acute oliguric kidney injury: secondary to decompensated HF. Remains on pressors and bradycardicContinue CRRT today; fluid removal rate now at 100cc/min  CVP 6-9 will hold CRRT.  Out of bed. 2  Acute decompensated HF/ cardiogenic shock: on milrinone and levophed.  3. New Aflutter: In NSR. 4. PAH: on sildenafil 20 mg TID. 5. h/o LV thrombus: on Eliquis as outpatient; on systemic heparin.  6. Hyponatremia: improving          7.  Shock  Subjective: Interval History: Now has IJ temp cath, CVP 6-9.    Objective: Vital signs in last 24 hours: Temp:  [97.6 F (36.4 C)-98.6 F (37 C)] 98.6 F (37 C) (06/27 1311) Pulse Rate:  [49-58] 49 (06/27 1000) Resp:  [12-28] 12 (06/27 1245) BP: (82-115)/(44-74) 112/67 (06/27 1245) SpO2:  [86 %-100 %] 93 % (06/27 1245) FiO2 (%):  [2 %] 2 % (06/27 1141) Weight:  [54 kg (119 lb 0.8 oz)] 54 kg (119 lb 0.8 oz) (06/27 0400) Weight change: -0.885 kg (-1 lb 15.2 oz)  Intake/Output from previous day: 06/26 0701 - 06/27 0700 In: 1391.4 [P.O.:480; I.V.:911.4] Out: 3467 [Urine:25] Intake/Output this shift: Total I/O In: 357.7 [I.V.:107.7; IV Piggyback:250] Out: 109 [Other:109]  General appearance: alert and cooperative Neck: distended Chest wall: no tenderness Cardio: regular rate and rhythm, S1, S2 normal, no murmur, click, rub or gallop GI: tender liver Extremities: extremities normal, atraumatic, no cyanosis or edema  Lab Results:  Recent Labs  09/04/16 0355 09/05/16 0428  WBC 5.9 5.4  HGB 10.9* 12.0  HCT 32.9* 35.0*  PLT 158 177   BMET:  Recent Labs  09/04/16 1713 09/05/16 0628  NA 129* 131*  K 4.7 4.5  CL 97* 99*  CO2 25 25  GLUCOSE 197* 179*  BUN 16 13  CREATININE 1.70* 1.71*  CALCIUM 8.8* 8.9   No results for input(s): PTH in the last 72 hours. Iron Studies: No results for input(s): IRON, TIBC, TRANSFERRIN, FERRITIN in the last 72 hours. Studies/Results: No results  found.  Scheduled: . aspirin EC  81 mg Oral Daily  . Chlorhexidine Gluconate Cloth  6 each Topical Q0600  . heparin      . insulin aspart  0-15 Units Subcutaneous TID WC  . insulin glargine  5 Units Subcutaneous Daily  . lidocaine (PF)      . mouth rinse  15 mL Mouth Rinse BID  . pravastatin  20 mg Oral q1800  . sildenafil  20 mg Oral TID  . sodium chloride flush  10-40 mL Intracatheter Q12H     LOS: 14 days   Jessica [name] C 09/05/2016,1:27 PM

## 2016-09-05 NOTE — Progress Notes (Signed)
Dr. Shirlee LatchMcLean notified of SBPs in 80s. Made aware of Levophed at 5mcg. Order received to keep SBP greater than 85, and to titrate Levophed as needed to maintain this. Will continue to closely monitor. Thresa RossA. Haggard RN

## 2016-09-06 LAB — RENAL FUNCTION PANEL
ALBUMIN: 2.9 g/dL — AB (ref 3.5–5.0)
ANION GAP: 10 (ref 5–15)
Albumin: 3 g/dL — ABNORMAL LOW (ref 3.5–5.0)
Anion gap: 10 (ref 5–15)
BUN: 29 mg/dL — ABNORMAL HIGH (ref 6–20)
BUN: 33 mg/dL — AB (ref 6–20)
CALCIUM: 9.1 mg/dL (ref 8.9–10.3)
CALCIUM: 9 mg/dL (ref 8.9–10.3)
CO2: 25 mmol/L (ref 22–32)
CO2: 22 mmol/L (ref 22–32)
CREATININE: 3.8 mg/dL — AB (ref 0.44–1.00)
Chloride: 92 mmol/L — ABNORMAL LOW (ref 101–111)
Chloride: 91 mmol/L — ABNORMAL LOW (ref 101–111)
Creatinine, Ser: 3.24 mg/dL — ABNORMAL HIGH (ref 0.44–1.00)
GFR, EST AFRICAN AMERICAN: 17 mL/min — AB (ref >60)
GFR, EST AFRICAN AMERICAN: 14 mL/min — AB (ref >60)
GFR, EST NON AFRICAN AMERICAN: 15 mL/min — AB (ref >60)
GFR, EST NON AFRICAN AMERICAN: 12 mL/min — AB (ref >60)
Glucose, Bld: 229 mg/dL — ABNORMAL HIGH (ref 65–99)
Glucose, Bld: 205 mg/dL — ABNORMAL HIGH (ref 65–99)
PHOSPHORUS: 3.3 mg/dL (ref 2.5–4.6)
POTASSIUM: 5.2 mmol/L — AB (ref 3.5–5.1)
Phosphorus: 2.8 mg/dL (ref 2.5–4.6)
Potassium: 4.5 mmol/L (ref 3.5–5.1)
SODIUM: 123 mmol/L — AB (ref 135–145)
Sodium: 127 mmol/L — ABNORMAL LOW (ref 135–145)

## 2016-09-06 LAB — HEPARIN LEVEL (UNFRACTIONATED): Heparin Unfractionated: 0.37 IU/mL (ref 0.30–0.70)

## 2016-09-06 LAB — COOXEMETRY PANEL
Carboxyhemoglobin: 1.4 % (ref 0.5–1.5)
Methemoglobin: 1.3 % (ref 0.0–1.5)
O2 Saturation: 54.8 %
Total hemoglobin: 11 g/dL — ABNORMAL LOW (ref 12.0–16.0)

## 2016-09-06 LAB — MAGNESIUM: Magnesium: 2.6 mg/dL — ABNORMAL HIGH (ref 1.7–2.4)

## 2016-09-06 LAB — CBC
HEMATOCRIT: 31.7 % — AB (ref 36.0–46.0)
Hemoglobin: 10.8 g/dL — ABNORMAL LOW (ref 12.0–15.0)
MCH: 28 pg (ref 26.0–34.0)
MCHC: 34.1 g/dL (ref 30.0–36.0)
MCV: 82.1 fL (ref 78.0–100.0)
Platelets: 155 10*3/uL (ref 150–400)
RBC: 3.86 MIL/uL — ABNORMAL LOW (ref 3.87–5.11)
RDW: 17.1 % — AB (ref 11.5–15.5)
WBC: 5.9 10*3/uL (ref 4.0–10.5)

## 2016-09-06 LAB — GLUCOSE, CAPILLARY
GLUCOSE-CAPILLARY: 145 mg/dL — AB (ref 65–99)
GLUCOSE-CAPILLARY: 148 mg/dL — AB (ref 65–99)
GLUCOSE-CAPILLARY: 268 mg/dL — AB (ref 65–99)
Glucose-Capillary: 190 mg/dL — ABNORMAL HIGH (ref 65–99)
Glucose-Capillary: 203 mg/dL — ABNORMAL HIGH (ref 65–99)

## 2016-09-06 MED ORDER — HEPARIN (PORCINE) IN NACL 100-0.45 UNIT/ML-% IJ SOLN
1200.0000 [IU]/h | INTRAMUSCULAR | Status: DC
  Administered 2016-09-06 – 2016-09-10 (×5): 1200 [IU]/h via INTRAVENOUS
  Filled 2016-09-06 (×5): qty 250

## 2016-09-06 MED ORDER — AMIODARONE HCL 200 MG PO TABS
200.0000 mg | ORAL_TABLET | Freq: Two times a day (BID) | ORAL | Status: DC
  Administered 2016-09-06 – 2016-09-07 (×3): 200 mg via ORAL
  Filled 2016-09-06 (×3): qty 1

## 2016-09-06 NOTE — Progress Notes (Signed)
Assessment/ Plan:   1. Acute oliguric kidney injury: secondary to decompensated HF. Remains on pressors and slow HR            Off CRRT and ambulated!  More likel;y to tolerate IHD  2  Acute decompensated HF/ cardiogenic shock: on milrinone and levophed.  3. New Aflutter: s/p successful DCCV. In NSR. 4. PAH: on sildenafil 20 mg TID. 5. h/o LV thrombus: on Eliquis as outpatient; on systemic heparin.  6. Hyponatremia: improving  Subjective: Interval History: s/p temp HD cath, [facility] with PT today!  Objective: Vital signs in last 24 hours: Temp:  [97.9 F (36.6 C)-98.8 F (37.1 C)] 97.9 F (36.6 C) (06/28 1133) Pulse Rate:  [34-63] 51 (06/28 1130) Resp:  [12-30] 16 (06/28 1130) BP: (84-124)/(43-100) 121/55 (06/28 1130) SpO2:  [92 %-100 %] 98 % (06/28 1130) Weight:  [54.6 kg (120 lb 4.8 oz)] 54.6 kg (120 lb 4.8 oz) (06/28 0625) Weight change: 0.568 kg (1 lb 4 oz)  Intake/Output from previous day: 06/27 0701 - 06/28 0700 In: 1432.6 [P.O.:480; I.V.:702.6; IV Piggyback:250] Out: 109  Intake/Output this shift: Total I/O In: 595.2 [P.O.:480; I.V.:115.2] Out: -   CVP 6 up to 11 General appearance: sitting up in chaitr, alert and coop  Cor RRR Alert and apprpriate Tender RUQ and big neck veins  Lab Results:  Recent Labs  09/05/16 0428 09/06/16 0435  WBC 5.4 5.9  HGB 12.0 10.8*  HCT 35.0* 31.7*  PLT 177 155   BMET:  Recent Labs  09/05/16 1818 09/06/16 0435  NA 127* 127*  K 4.9 5.2*  CL 96* 92*  CO2 23 25  GLUCOSE 244* 229*  BUN 20 29*  CREATININE 2.46* 3.24*  CALCIUM 8.8* 9.1   No results for input(s): PTH in the last 72 hours. Iron Studies: No results for input(s): IRON, TIBC, TRANSFERRIN, FERRITIN in the last 72 hours. Studies/Results: Ir Fluoro Guide Cv Line Right  Result Date: 09/05/2016 INDICATION: Renal failure EXAM: TEMPORARY DIALYSIS CATHETER PLACEMENT MEDICATIONS: None ANESTHESIA/SEDATION: None FLUOROSCOPY TIME:  Fluoroscopy Time:  minutes  24 seconds (1 mGy). COMPLICATIONS: None immediate. PROCEDURE: Informed written consent was obtained from the patient after a thorough discussion of the procedural risks, benefits and alternatives. All questions were addressed. Maximal Sterile Barrier Technique was utilized including caps, mask, sterile gowns, sterile gloves, sterile drape, hand hygiene and skin antiseptic. A timeout was performed prior to the initiation of the procedure. The right neck was prepped and draped in a sterile fashion. Under sonographic guidance, a micropuncture needle was inserted into the right jugular vein and removed over a 018 wire which was up sized to an Amplatz. The temporary dialysis catheter was then advanced over the wire. It was flushed and sewn in place. FINDINGS: Image demonstrates a temporary dialysis catheter with its tip at the cavoatrial junction. IMPRESSION: Successful right jugular temporary dialysis catheter placement. Electronically Signed   By: Jolaine Click M.D.   On: 09/05/2016 16:08   Ir US Guide Vasc Access Right  Result Date: 09/05/2016 INDICATION: Renal failure EXAM: TEMPORARY DIALYSIS CATHETER PLACEMENT MEDICATIONS: None ANESTHESIA/SEDATION: None FLUOROSCOPY TIME:  Fluoroscopy Time:  minutes 24 seconds (1 mGy). COMPLICATIONS: None immediate. PROCEDURE: Informed written consent was obtained from the patient after a thorough discussion of the procedural risks, benefits and alternatives. All questions were addressed. Maximal Sterile Barrier Technique was utilized including caps, mask, sterile gowns, sterile gloves, sterile drape, hand hygiene and skin antiseptic. A timeout was performed prior to the initiation of the procedure.  The right neck was prepped and draped in a sterile fashion. Under sonographic guidance, a micropuncture needle was inserted into the right jugular vein and removed over a 018 wire which was up sized to an Amplatz. The temporary dialysis catheter was then advanced over the wire. It was  flushed and sewn in place. FINDINGS: Image demonstrates a temporary dialysis catheter with its tip at the cavoatrial junction. IMPRESSION: Successful right jugular temporary dialysis catheter placement. Electronically Signed   By: Jolaine ClickArthur  Hoss M.D.   On: 09/05/2016 16:08    Continuous: . heparin 1,200 Units/hr (09/06/16 1148)  . milrinone 0.375 mcg/kg/min (09/06/16 0700)  . norepinephrine (LEVOPHED) Adult infusion 5 mcg/min (09/06/16 1153)  . dialysis replacement fluid (prismasate) 300 mL/hr at 09/04/16 2000  . dialysis replacement fluid (prismasate) 300 mL/hr at 09/04/16 2000  . dialysate (PRISMASATE) 1,000 mL/hr at 09/05/16 1027  . sodium chloride 999 mL/hr at 08/29/16 1307    LOS: 15 days   Jeanmarc Viernes C 09/06/2016,12:06 PM

## 2016-09-06 NOTE — Evaluation (Signed)
Physical Therapy Evaluation Patient Details Name: Jessica MoccasinSherita [name] MRN: [id number] DOB: December 03, 1960 Today's Date: 09/06/2016   History of Present Illness  56yof on eliquis for afib, s/p DCCV on 6/18, transitioned to IV heparin 6/20 anticipating CVVHD due to Acute oliguric kidney injury: secondary to decompensated HF.  CVVHD stopped 09/05/16.     Clinical Impression  Pt admitted with above diagnosis. Pt currently with functional limitations due to the deficits listed below (see PT Problem List). Pt was able to ambulate with RW with good steady gait overall.  Should progress well.  Has family support.  Will follow acutely.  Pt will benefit from skilled PT to increase their independence and safety with mobility to allow discharge to the venue listed below.      Follow Up Recommendations Home health PT;Supervision/Assistance - 24 hour    Equipment Recommendations  None recommended by PT    Recommendations for Other Services       Precautions / Restrictions Precautions Precautions: Fall Restrictions Weight Bearing Restrictions: No      Mobility  Bed Mobility Overal bed mobility: Needs Assistance Bed Mobility: Sit to Supine       Sit to supine: Min guard   General bed mobility comments: No difficulties getting LEs back in bed.   Transfers Overall transfer level: Needs assistance Equipment used: Rolling walker (2 wheeled) Transfers: Sit to/from Stand Sit to Stand: Min guard         General transfer comment: No assist needed to stand.  Cues given for hand placement.   Ambulation/Gait Ambulation/Gait assistance: Min assist Ambulation Distance (Feet): 150 Feet Assistive device: Rolling walker (2 wheeled) Gait Pattern/deviations: Step-through pattern;Decreased stride length   Gait velocity interpretation: <1.8 ft/sec, indicative of risk for recurrent falls General Gait Details: Pt was able to ambulate with RW with cues for sequencing steps and RW.  Pt was able to ambulate  with steadying assist only at times.  Able to steer RW well.   Stairs            Wheelchair Mobility    Modified Rankin (Stroke Patients Only)       Balance Overall balance assessment: Needs assistance Sitting-balance support: No upper extremity supported;Feet supported Sitting balance-Leahy Scale: Good     Standing balance support: Bilateral upper extremity supported;During functional activity Standing balance-Leahy Scale: Fair Standing balance comment: can stand statically without UE support.              High level balance activites: Direction changes;Turns;Sudden stops High Level Balance Comments: min guard assist and cues with RW             Pertinent Vitals/Pain Pain Assessment: No/denies pain    Home Living Family/patient expects to be discharged to:: Private residence Living Arrangements: Children Available Help at Discharge: Family;Available 24 hours/day (daughter works 8 am to 9 pm.  Grandchildren can assist-18,10) Type of Home: House Home Access: Level entry     Home Layout: One level Home Equipment: Cane - single point;Walker - 2 wheels      Prior Function Level of Independence: Independent with assistive device(s)         Comments: used cane at times per pt     Hand Dominance        Extremity/Trunk Assessment   Upper Extremity Assessment Upper Extremity Assessment: Defer to OT evaluation    Lower Extremity Assessment Lower Extremity Assessment: Generalized weakness    Cervical / Trunk Assessment Cervical / Trunk Assessment: Normal  Communication   Communication:  No difficulties  Cognition Arousal/Alertness: Awake/alert Behavior During Therapy: WFL for tasks assessed/performed Overall Cognitive Status: Within Functional Limits for tasks assessed                                        General Comments      Exercises     Assessment/Plan    PT Assessment Patient needs continued PT services  PT  Problem List Decreased activity tolerance;Decreased balance;Decreased knowledge of use of DME;Decreased mobility;Decreased safety awareness;Decreased knowledge of precautions       PT Treatment Interventions DME instruction;Gait training;Stair training;Therapeutic activities;Functional mobility training;Therapeutic exercise;Balance training;Patient/family education    PT Goals (Current goals can be found in the Care Plan section)  Acute Rehab PT Goals Patient Stated Goal: to go home PT Goal Formulation: With patient Time For Goal Achievement: 09/20/16 Potential to Achieve Goals: Good    Frequency Min 3X/week   Barriers to discharge        Co-evaluation               AM-PAC PT "6 Clicks" Daily Activity  Outcome Measure Difficulty turning over in bed (including adjusting bedclothes, sheets and blankets)?: None Difficulty moving from lying on back to sitting on the side of the bed? : None Difficulty sitting down on and standing up from a chair with arms (e.g., wheelchair, bedside commode, etc,.)?: A Little Help needed moving to and from a bed to chair (including a wheelchair)?: A Little Help needed walking in hospital room?: A Little Help needed climbing 3-5 steps with a railing? : A Little 6 Click Score: 20    End of Session Equipment Utilized During Treatment: Gait belt;Oxygen Activity Tolerance: Patient limited by fatigue Patient left: in bed;with call bell/phone within reach Nurse Communication: Mobility status PT Visit Diagnosis: Unsteadiness on feet (R26.81);Muscle weakness (generalized) (M62.81)    Time: 6962-9528 PT Time Calculation (min) (ACUTE ONLY): 35 min   Charges:   PT Evaluation $PT Eval Moderate Complexity: 1 Procedure PT Treatments $Gait Training: 8-22 mins   PT G Codes:        Calyssa Zobrist,PT Acute Rehabilitation [phone] (pager)   Berline Lopes 09/06/2016, 11:22 AM

## 2016-09-06 NOTE — Progress Notes (Signed)
Patient ID: Jessica [name], female   DOB: 1960-03-23, 56 y.o.   MRN: [id number]   Advanced Heart Failure Rounding Note   Subjective:    Admitted with new onset atrial flutter and ADHF. Milrinone started 6/15 for worsening HF and renal failure.  S/P TEE and successful DC-CV.   Moved to ICU on 6/19 due to low output and worsening AKI. CVVHD catheter placed. Remains on CVVHD. Pulling 150 cc per hour. Urine output 250cc.    Yesterday CVVHD stopped and had RIJ cath placed.  She continues on milrinone 0.375 mcg and norepi increased to 6 mcg. SBP was down in the 70s last night. Todays CO-OX is 55%.   She reports small amount of urine. Creatinine 3.2 Denies SOB. CVP 6    Objective:   Weight Range:  Vital Signs:   Temp:  [97.8 F (36.6 C)-98.6 F (37 C)] 98.5 F (36.9 C) (06/28 0300) Pulse Rate:  [47-63] 50 (06/28 0625) Resp:  [12-30] 15 (06/28 0625) BP: (82-124)/(43-100) 124/62 (06/28 0625) SpO2:  [86 %-100 %] 97 % (06/28 0625) FiO2 (%):  [2 %] 2 % (06/27 1141) Weight:  [120 lb 4.8 oz (54.6 kg)] 120 lb 4.8 oz (54.6 kg) (06/28 0625) Last BM Date: 09/04/16  Weight change: Filed Weights   09/04/16 0407 09/05/16 0400 09/06/16 0625  Weight: 121 lb (54.9 kg) 119 lb 0.8 oz (54 kg) 120 lb 4.8 oz (54.6 kg)    Intake/Output:   Intake/Output Summary (Last 24 hours) at 09/06/16 0704 Last data filed at 09/06/16 0600  Gross per 24 hour  Intake           1403.8 ml  Output              109 ml  Net           1294.8 ml     Physical Exam: CVP 6-7 General:  Appears fatigued. No resp difficulty. Sitting in the chair.  HEENT: normal Neck: supple. JVP 6-7. Carotids 2+ bilat; no bruits. No lymphadenopathy or thryomegaly appreciated. RIJ temp HD cath Cor: PMI nondisplaced. Regular rate & rhythm. No rubs, gallops or murmurs. Lungs: clear Abdomen: soft, nontender, nondistended. No hepatosplenomegaly. No bruits or masses. Good bowel sounds. Extremities: no cyanosis, clubbing, rash, edema . RUE  PICC.  Neuro: alert & orientedx3, cranial nerves grossly intact. moves all 4 extremities w/o difficulty. Affect pleasant   Telemetry: Personally reviewed. Sinus Brady 50s.  Labs: Basic Metabolic Panel:  Recent Labs Lab 09/03/16 0210  09/04/16 0355 09/04/16 1713 09/05/16 0428 09/05/16 0628 09/05/16 1818 09/06/16 0435  NA 131*  < > 129* 129*  --  131* 127* 127*  K 4.6  < > 4.3 4.7  --  4.5 4.9 5.2*  CL 97*  < > 96* 97*  --  99* 96* 92*  CO2 26  < > 24 25  --  25 23 25   GLUCOSE 152*  < > 184* 197*  --  179* 244* 229*  BUN 12  < > 16 16  --  13 20 29*  CREATININE 1.74*  < > 2.08* 1.70*  --  1.71* 2.46* 3.24*  CALCIUM 8.8*  < > 8.7* 8.8*  --  8.9 8.8* 9.1  MG 2.6*  --  2.6*  --  2.6* 2.6*  --  2.6*  PHOS 1.7*  < > 1.9* 1.7*  --  2.0* 2.3* 3.3  < > = values in this interval not displayed.  Liver Function Tests:  Recent Labs Lab  09/04/16 0355 09/04/16 1713 09/05/16 0628 09/05/16 1818 09/06/16 0435  ALBUMIN 3.2* 3.0* 3.0* 2.9* 2.9*   No results for input(s): LIPASE, AMYLASE in the last 168 hours. No results for input(s): AMMONIA in the last 168 hours.  CBC:  Recent Labs Lab 09/02/16 0438 09/03/16 0210 09/04/16 0355 09/05/16 0428 09/06/16 0435  WBC 5.7 6.1 5.9 5.4 5.9  HGB 9.9* 10.6* 10.9* 12.0 10.8*  HCT 30.3* 31.7* 32.9* 35.0* 31.7*  MCV 83.5 83.2 83.9 83.7 82.1  PLT 128* 138* 158 177 155    Cardiac Enzymes: No results for input(s): CKTOTAL, CKMB, CKMBINDEX, TROPONINI in the last 168 hours.  BNP: BNP (last 3 results)  Recent Labs  09/28/15 1114  BNP 871.3*    ProBNP (last 3 results) No results for input(s): PROBNP in the last 8760 hours.    Other results:  Imaging: Ir Fluoro Guide Cv Line Right  Result Date: 09/05/2016 INDICATION: Renal failure EXAM: TEMPORARY DIALYSIS CATHETER PLACEMENT MEDICATIONS: None ANESTHESIA/SEDATION: None FLUOROSCOPY TIME:  Fluoroscopy Time:  minutes 24 seconds (1 mGy). COMPLICATIONS: None immediate. PROCEDURE:  Informed written consent was obtained from the patient after a thorough discussion of the procedural risks, benefits and alternatives. All questions were addressed. Maximal Sterile Barrier Technique was utilized including caps, mask, sterile gowns, sterile gloves, sterile drape, hand hygiene and skin antiseptic. A timeout was performed prior to the initiation of the procedure. The right neck was prepped and draped in a sterile fashion. Under sonographic guidance, a micropuncture needle was inserted into the right jugular vein and removed over a 018 wire which was up sized to an Amplatz. The temporary dialysis catheter was then advanced over the wire. It was flushed and sewn in place. FINDINGS: Image demonstrates a temporary dialysis catheter with its tip at the cavoatrial junction. IMPRESSION: Successful right jugular temporary dialysis catheter placement. Electronically Signed   By: Jolaine Click M.D.   On: 09/05/2016 16:08   Ir US Guide Vasc Access Right  Result Date: 09/05/2016 INDICATION: Renal failure EXAM: TEMPORARY DIALYSIS CATHETER PLACEMENT MEDICATIONS: None ANESTHESIA/SEDATION: None FLUOROSCOPY TIME:  Fluoroscopy Time:  minutes 24 seconds (1 mGy). COMPLICATIONS: None immediate. PROCEDURE: Informed written consent was obtained from the patient after a thorough discussion of the procedural risks, benefits and alternatives. All questions were addressed. Maximal Sterile Barrier Technique was utilized including caps, mask, sterile gowns, sterile gloves, sterile drape, hand hygiene and skin antiseptic. A timeout was performed prior to the initiation of the procedure. The right neck was prepped and draped in a sterile fashion. Under sonographic guidance, a micropuncture needle was inserted into the right jugular vein and removed over a 018 wire which was up sized to an Amplatz. The temporary dialysis catheter was then advanced over the wire. It was flushed and sewn in place. FINDINGS: Image demonstrates a  temporary dialysis catheter with its tip at the cavoatrial junction. IMPRESSION: Successful right jugular temporary dialysis catheter placement. Electronically Signed   By: Jolaine Click M.D.   On: 09/05/2016 16:08     Medications:     Scheduled Medications: . aspirin EC  81 mg Oral Daily  . Chlorhexidine Gluconate Cloth  6 each Topical Q0600  . insulin aspart  0-15 Units Subcutaneous TID WC  . insulin glargine  5 Units Subcutaneous Daily  . mouth rinse  15 mL Mouth Rinse BID  . pravastatin  20 mg Oral q1800  . sildenafil  20 mg Oral TID  . sodium chloride flush  10-40 mL Intracatheter Q12H  Infusions: . amiodarone 30 mg/hr (09/06/16 0600)  .  ceFAZolin (ANCEF) IV    . heparin Stopped (09/05/16 0900)  . milrinone 0.375 mcg/kg/min (09/06/16 0600)  . norepinephrine (LEVOPHED) Adult infusion 6 mcg/min (09/06/16 0600)  . dialysis replacement fluid (prismasate) 300 mL/hr at 09/04/16 2000  . dialysis replacement fluid (prismasate) 300 mL/hr at 09/04/16 2000  . dialysate (PRISMASATE) 1,000 mL/hr at 09/05/16 1027  . sodium chloride 999 mL/hr at 08/29/16 1307    PRN Medications: acetaminophen, albuterol, benzonatate, chlorpheniramine-HYDROcodone, docusate sodium, heparin, hydrOXYzine, mineral oil-hydrophilic petrolatum, ondansetron (ZOFRAN) IV, sodium chloride, sodium chloride flush   Assessment/Plan/Discussion:   Jessica [name] is a 56 year old with h/o chronic systolic heart failure, ICM, Biotronik ICD, and PAH admitted today with new onset A Fib/Flutter  1. A flutter RVR: New onset June 11 with device interrogation. S/P TEE DC-CV with conversion to NSR.  -Maintaining SR.  Continue amio has been bradycardic in the 50s. Consider switching to po amio. Restart heparin drip.   Discussed with Pharm D  2. A/C Systolic Biventricular Heart Failure/cardiogenic shock: Ischemic cardiomyopathy.  Echo 08/24/16. LVEF 10% severe RV HK (EF down further in setting of AFL).   Remains on dual inotropes,  milrinone 0.375 mcg + 6 mcg norepi. Try wean norepi.  Todays CO-OX 55%. CVP 6-8  Off CVVHD for now.     - No bb or ACE/ARB/ARNI with low output and AKI .  3. Acute on chronic renal failure (baseline CKD 3) due to cardio-renal syndrome.  CVVHD started 6/20.  Temp HD cath placed. Off CVVHD. Minimal urine output.  Creatinine trending up.  4. PAH - Continue sildenafil 20 mg TID for now.  5. CAD S/P multiple interventions. No S/S ischemia  - No CP. Continues asa.  No nitro with sildenafil.  - She was unable to take simvastatin due to myalgias. Continue pravastatin.   6. PAD: occluded left SFA with mild/mod abnormal ABIs. No revascularization was needed October 2017. No change.  7. H/O LV Thrombus: no thrombus on TEE. No change 8. Hyponatremia: sodium 127.  9. Deconditioning- consult PT.      Amy Clegg NP-C  09/06/2016 7:04 AM  Patient seen and examined with Tonye BecketAmy Clegg, NP. We discussed all aspects of the encounter. I agree with the assessment and plan as stated above.   She remains very tenuous. Trialysis catheter switched to RIJ yesterday. Now in chair. CVP 6-8. Nearly anuric. BP soft. Continues to require dual pressors.   Will keep even with CVVHD. Continue to watch for Renal recovery. If no recovery, Hospice likely only option as her heart is not strong enough to tolerate iHD.   Arvilla MeresBensimhon, Daniel, MD  8:47 AM

## 2016-09-06 NOTE — Progress Notes (Signed)
Paged Cardiology on call Fellow about patients CVP 18. Lungs sound clear, on 2L O2, no complaints. Will continue to monitor.  Horton ChinMacKayla A Philippa Vessey, RN

## 2016-09-06 NOTE — Progress Notes (Signed)
ANTICOAGULATION CONSULT NOTE - Follow Up Consult  Pharmacy Consult for Heparin Indication: atrial fibrillation  Allergies  Allergen Reactions  . Nyquil Multi-Symptom [Pseudoeph-Doxylamine-Dm-Apap] Other (See Comments)    Fatigue, chest pressure and pain Pt tolerates Coricidin cough syrup at home  . Sudafed [Pseudoephedrine] Other (See Comments)    Fatigue, chest pressure and pain  . Lasix [Furosemide]     Rash  . Torsemide     Rash  . Zocor [Simvastatin]     Myalgias   . Latex Rash  . Ramipril Cough    Patient Measurements: Height: 5\' 5"  (165.1 cm) Weight: 120 lb 4.8 oz (54.6 kg) IBW/kg (Calculated) : 57  Vital Signs: Temp: 97.6 F (36.4 C) (06/28 1900) Temp Source: Axillary (06/28 1900) BP: 98/51 (06/28 2000) Pulse Rate: 45 (06/28 2040)  Labs:  Recent Labs  09/04/16 0355  09/05/16 0428  09/05/16 1818 09/06/16 0435 09/06/16 1620 09/06/16 2015  HGB 10.9*  --  12.0  --   --  10.8*  --   --   HCT 32.9*  --  35.0*  --   --  31.7*  --   --   PLT 158  --  177  --   --  155  --   --   LABPROT  --   --  13.7  --   --   --   --   --   INR  --   --  1.05  --   --   --   --   --   HEPARINUNFRC 0.34  --  0.18*  --   --   --   --  0.37  CREATININE 2.08*  < >  --   < > 2.46* 3.24* 3.80*  --   < > = values in this interval not displayed.  Estimated Creatinine Clearance: 13.9 mL/min (A) (by C-G formula based on SCr of 3.8 mg/dL (H)).  Assessment: 56yof on eliquis for afib, s/p DCCV on 6/18, transitioned to IV heparin 6/20 anticipating CVVHD. CVVHD stopped yesterday and heparin turned off for tunneled dialysis catheter placement, heparin resumed this afternoon.  Heparin level within goal range.  RN reports no bleeding.  Goal of Therapy:  Heparin level 0.3-0.7 units/ml Monitor platelets by anticoagulation protocol: Yes   Plan:  Continue heparin at 1200 units/hr  Daily heparin level and CBC [facility]  Alvester MorinKendra Peighton Edgin, B.S., PharmD Clinical  Pharmacist  System- [facility]

## 2016-09-06 NOTE — Progress Notes (Signed)
ANTICOAGULATION CONSULT NOTE - Follow Up Consult  Pharmacy Consult for Heparin Indication: atrial fibrillation  Allergies  Allergen Reactions  . Nyquil Multi-Symptom [Pseudoeph-Doxylamine-Dm-Apap] Other (See Comments)    Fatigue, chest pressure and pain Pt tolerates Coricidin cough syrup at home  . Sudafed [Pseudoephedrine] Other (See Comments)    Fatigue, chest pressure and pain  . Lasix [Furosemide]     Rash  . Torsemide     Rash  . Zocor [Simvastatin]     Myalgias   . Latex Rash  . Ramipril Cough    Patient Measurements: Height: 5\' 5"  (165.1 cm) Weight: 120 lb 4.8 oz (54.6 kg) IBW/kg (Calculated) : 57  Vital Signs: Temp: 98.8 F (37.1 C) (06/28 0742) Temp Source: Oral (06/28 0742) BP: 111/61 (06/28 0700) Pulse Rate: 50 (06/28 0700)  Labs:  Recent Labs  09/04/16 0355  09/05/16 0428 09/05/16 0628 09/05/16 1818 09/06/16 0435  HGB 10.9*  --  12.0  --   --  10.8*  HCT 32.9*  --  35.0*  --   --  31.7*  PLT 158  --  177  --   --  155  LABPROT  --   --  13.7  --   --   --   INR  --   --  1.05  --   --   --   HEPARINUNFRC 0.34  --  0.18*  --   --   --   CREATININE 2.08*  < >  --  1.71* 2.46* 3.24*  < > = values in this interval not displayed.  Estimated Creatinine Clearance: 16.3 mL/min (A) (by C-G formula based on SCr of 3.24 mg/dL (H)).  Assessment: 56yof on eliquis for afib, s/p DCCV on 6/18, transitioned to IV heparin 6/20 anticipating CVVHD. CVVHD stopped yesterday and heparin turned off for tunneled dialysis catheter placement. OK to resume heparin today.  Goal of Therapy:  Heparin level 0.3-0.7 units/ml Monitor platelets by anticoagulation protocol: Yes   Plan:  1) Resume heparin at 1200 units/hr 2) 8 hour heparin level 3) Daily heparin level and CBC 4) Follow up plan for oral AC  Jessica RiggerMarkle, Jessica [name] 09/06/2016,8:01 AM

## 2016-09-07 LAB — RENAL FUNCTION PANEL
Albumin: 2.9 g/dL — ABNORMAL LOW (ref 3.5–5.0)
Albumin: 3 g/dL — ABNORMAL LOW (ref 3.5–5.0)
Anion gap: 9 (ref 5–15)
Anion gap: 10 (ref 5–15)
BUN: 40 mg/dL — ABNORMAL HIGH (ref 6–20)
BUN: 30 mg/dL — AB (ref 6–20)
CALCIUM: 8.8 mg/dL — AB (ref 8.9–10.3)
CALCIUM: 8.6 mg/dL — AB (ref 8.9–10.3)
CHLORIDE: 93 mmol/L — AB (ref 101–111)
CO2: 23 mmol/L (ref 22–32)
CO2: 20 mmol/L — ABNORMAL LOW (ref 22–32)
CREATININE: 4.44 mg/dL — AB (ref 0.44–1.00)
CREATININE: 3.23 mg/dL — AB (ref 0.44–1.00)
Chloride: 88 mmol/L — ABNORMAL LOW (ref 101–111)
GFR, EST AFRICAN AMERICAN: 12 mL/min — AB (ref >60)
GFR, EST AFRICAN AMERICAN: 17 mL/min — AB (ref >60)
GFR, EST NON AFRICAN AMERICAN: 10 mL/min — AB (ref >60)
GFR, EST NON AFRICAN AMERICAN: 15 mL/min — AB (ref >60)
Glucose, Bld: 257 mg/dL — ABNORMAL HIGH (ref 65–99)
Glucose, Bld: 156 mg/dL — ABNORMAL HIGH (ref 65–99)
PHOSPHORUS: 3.5 mg/dL (ref 2.5–4.6)
Phosphorus: 2.6 mg/dL (ref 2.5–4.6)
Potassium: 4.9 mmol/L (ref 3.5–5.1)
Potassium: 4.5 mmol/L (ref 3.5–5.1)
SODIUM: 120 mmol/L — AB (ref 135–145)
SODIUM: 123 mmol/L — AB (ref 135–145)

## 2016-09-07 LAB — CBC
HCT: 29.7 % — ABNORMAL LOW (ref 36.0–46.0)
Hemoglobin: 10 g/dL — ABNORMAL LOW (ref 12.0–15.0)
MCH: 27.7 pg (ref 26.0–34.0)
MCHC: 33.7 g/dL (ref 30.0–36.0)
MCV: 82.3 fL (ref 78.0–100.0)
PLATELETS: 145 10*3/uL — AB (ref 150–400)
RBC: 3.61 MIL/uL — ABNORMAL LOW (ref 3.87–5.11)
RDW: 17 % — AB (ref 11.5–15.5)
WBC: 5.7 10*3/uL (ref 4.0–10.5)

## 2016-09-07 LAB — COOXEMETRY PANEL
CARBOXYHEMOGLOBIN: 1.5 % (ref 0.5–1.5)
Methemoglobin: 0.8 % (ref 0.0–1.5)
O2 SAT: 49.6 %
Total hemoglobin: 13.4 g/dL (ref 12.0–16.0)

## 2016-09-07 LAB — GLUCOSE, CAPILLARY
Glucose-Capillary: 203 mg/dL — ABNORMAL HIGH (ref 65–99)
Glucose-Capillary: 164 mg/dL — ABNORMAL HIGH (ref 65–99)
Glucose-Capillary: 98 mg/dL (ref 65–99)

## 2016-09-07 LAB — MAGNESIUM: MAGNESIUM: 2.6 mg/dL — AB (ref 1.7–2.4)

## 2016-09-07 LAB — HEPARIN LEVEL (UNFRACTIONATED): Heparin Unfractionated: 0.48 IU/mL (ref 0.30–0.70)

## 2016-09-07 MED ORDER — FUROSEMIDE 10 MG/ML IJ SOLN: 120.0000 mg | Freq: Once | INTRAVENOUS | Status: DC

## 2016-09-07 MED ORDER — PRISMASOL BGK 4/2.5 32-4-2.5 MEQ/L IV SOLN
INTRAVENOUS | Status: DC
  Administered 2016-09-07 – 2016-09-09 (×8): via INTRAVENOUS_CENTRAL
  Filled 2016-09-07 (×12): qty 5000

## 2016-09-07 MED ORDER — FUROSEMIDE 10 MG/ML IJ SOLN
160.0000 mg | Freq: Once | INTRAVENOUS | Status: DC
  Filled 2016-09-07: qty 16

## 2016-09-07 MED ORDER — PRISMASOL BGK 4/2.5 32-4-2.5 MEQ/L IV SOLN
INTRAVENOUS | Status: DC
  Administered 2016-09-07 – 2016-09-08 (×4): via INTRAVENOUS_CENTRAL
  Filled 2016-09-07 (×4): qty 5000

## 2016-09-07 MED ORDER — PRISMASOL BGK 4/2.5 32-4-2.5 MEQ/L IV SOLN
INTRAVENOUS | Status: DC
  Administered 2016-09-07 – 2016-09-08 (×3): via INTRAVENOUS_CENTRAL
  Filled 2016-09-07 (×4): qty 5000

## 2016-09-07 MED ORDER — AMIODARONE HCL 200 MG PO TABS
200.0000 mg | ORAL_TABLET | Freq: Every day | ORAL | Status: DC
  Administered 2016-09-08 – 2016-10-01 (×24): 200 mg via ORAL
  Filled 2016-09-07 (×24): qty 1

## 2016-09-07 MED ORDER — SODIUM CHLORIDE 0.9 % FOR CRRT
INTRAVENOUS_CENTRAL | Status: DC | PRN
  Filled 2016-09-07: qty 1000

## 2016-09-07 MED ORDER — INSULIN GLARGINE 100 UNIT/ML ~~LOC~~ SOLN
8.0000 [IU] | Freq: Every day | SUBCUTANEOUS | Status: DC
  Administered 2016-09-08 – 2016-09-10 (×3): 8 [IU] via SUBCUTANEOUS
  Filled 2016-09-07 (×4): qty 0.08

## 2016-09-07 MED ORDER — FUROSEMIDE 10 MG/ML IJ SOLN
INTRAMUSCULAR | Status: AC
  Administered 2016-09-07: 160 mg
  Filled 2016-09-07: qty 16

## 2016-09-07 NOTE — Plan of Care (Signed)
Problem: Activity: Goal: Activity intolerance will improve Outcome: Progressing Pt placed back on CRRT 6/29. In bed at this time  Problem: Fluid Volume: Goal: Fluid volume balance will be maintained or improved Outcome: Progressing Attempting to remolve 13650ml/hr  Problem: Cardiac: Goal: Cardiac symptoms related to disease process will be avoided Outcome: Progressing Pt DCCV, currently in SB 40-50s

## 2016-09-07 NOTE — Progress Notes (Signed)
Inpatient Diabetes Program Recommendations  AACE/ADA: New Consensus Statement on Inpatient Glycemic Control (2015)  Target Ranges:  Prepandial:   less than 140 mg/dL      Peak postprandial:   less than 180 mg/dL (1-2 hours)      Critically ill patients:  140 - 180 mg/dL   Results for Jessica [name], Jessica [name] (MRN [id number]) as of 09/07/2016 05:08  Ref. Range 09/06/2016 07:41 09/06/2016 11:32 09/06/2016 16:49 09/06/2016 22:28  Glucose-Capillary Latest Ref Range: 65 - 99 mg/dL 562190 (H) 130203 (H) 865148 (H) 268 (H)   Results for Jessica [name], Jessica [name] (MRN [id number]) as of 09/07/2016 05:08  Ref. Range 09/07/2016 03:36  Glucose Latest Ref Range: 65 - 99 mg/dL 284257 (H)    Home DM Meds: Lantus 5 units daily  Current Insulin Orders: Lantus 5 units daily      Novolog Moderate Correction Scale/ SSI (0-15 units) TID AC     MD- Note patient eating 75-100% of meals.  Having elevated fasting and postprandial glucose levels.  Please consider the following in-hospital insulin adjustments:  1. Increase Lantus to 8 units daily (0.15 units/kg dosing)  2. Start low dose Novolog Meal Coverage: Novolog 2 units TID with meals (hold if pt eats <50% of meal)      --Will follow patient during hospitalization--  Ambrose FinlandJeannine Johnston Filippo Puls RN, MSN, CDE Diabetes Coordinator Inpatient Glycemic Control Team Team Pager: [phone] (8a-5p)

## 2016-09-07 NOTE — Progress Notes (Signed)
ANTICOAGULATION CONSULT NOTE - Follow Up Consult  Pharmacy Consult for Heparin Indication: atrial fibrillation  Allergies  Allergen Reactions  . Nyquil Multi-Symptom [Pseudoeph-Doxylamine-Dm-Apap] Other (See Comments)    Fatigue, chest pressure and pain Pt tolerates Coricidin cough syrup at home  . Sudafed [Pseudoephedrine] Other (See Comments)    Fatigue, chest pressure and pain  . Lasix [Furosemide]     Rash  . Torsemide     Rash  . Zocor [Simvastatin]     Myalgias   . Latex Rash  . Ramipril Cough    Patient Measurements: Height: 5\' 5"  (165.1 cm) Weight: 125 lb 7.1 oz (56.9 kg) IBW/kg (Calculated) : 57  Vital Signs: Temp: 98 F (36.7 C) (06/29 0730) Temp Source: Oral (06/29 0730) BP: 97/53 (06/29 1100) Pulse Rate: 58 (06/29 1100)  Labs:  Recent Labs  09/05/16 0428  09/06/16 0435 09/06/16 1620 09/06/16 2015 09/07/16 0333 09/07/16 0334 09/07/16 0336  HGB 12.0  --  10.8*  --   --   --  10.0*  --   HCT 35.0*  --  31.7*  --   --   --  29.7*  --   PLT 177  --  155  --   --   --  145*  --   LABPROT 13.7  --   --   --   --   --   --   --   INR 1.05  --   --   --   --   --   --   --   HEPARINUNFRC 0.18*  --   --   --  0.37 0.48  --   --   CREATININE  --   < > 3.24* 3.80*  --   --   --  4.44*  < > = values in this interval not displayed.  Estimated Creatinine Clearance: 12.4 mL/min (A) (by C-G formula based on SCr of 4.44 mg/dL (H)).  Assessment: 56yof on eliquis for afib, s/p DCCV on 6/18, transitioned to IV heparin 6/20 anticipating CVVHD. CVVHD stopped 6/27, temp IJ catheter placed yesterday, heparin resumed, and CVVHD to resume today. Heparin level is therapeutic at 0.48. CBC stable. No bleeding.  Goal of Therapy:  Heparin level 0.3-0.7 units/ml Monitor platelets by anticoagulation protocol: Yes   Plan:  1) Continue heparin at 1200 units/hr 2) Daily heparin level and CBC 3) Follow up plan for oral AC  Fredrik RiggerMarkle, Geetika Laborde Sue 09/07/2016,11:17 AM

## 2016-09-07 NOTE — Progress Notes (Signed)
Patient ID: Jessica [name], female   DOB: 08-10-1960, 56 y.o.   MRN: [id number]   Advanced Heart Failure Rounding Note   Subjective:    Admitted with new onset atrial flutter and ADHF. Milrinone started 6/15 for worsening HF and renal failure.  S/P TEE and successful DC-CV.   Moved to ICU on 6/19 due to low output and worsening AKI. CVVHD catheter placed. Remains on CVVHD. Pulling 150 cc per hour. Urine output 250cc.    CVVHD stopped 6/27. Temp RIJ placed.  She continues on milrinone 0.375 mcg and norepi 5 mcg. Todays CO-OX is 50%. Minimal urine output. Pink color.   Complaining of fatigue. Denies SOB.     Objective:   Weight Range:  Vital Signs:   Temp:  [97.6 F (36.4 C)-98.8 F (37.1 C)] 98 F (36.7 C) (06/29 0300) Pulse Rate:  [34-54] 47 (06/29 0600) Resp:  [15-26] 18 (06/29 0600) BP: (74-121)/(20-84) 95/44 (06/29 0600) SpO2:  [89 %-100 %] 96 % (06/29 0600) Weight:  [125 lb 7.1 oz (56.9 kg)] 125 lb 7.1 oz (56.9 kg) (06/29 0500) Last BM Date: 09/07/16  Weight change: Filed Weights   09/05/16 0400 09/06/16 0625 09/07/16 0500  Weight: 119 lb 0.8 oz (54 kg) 120 lb 4.8 oz (54.6 kg) 125 lb 7.1 oz (56.9 kg)    Intake/Output:   Intake/Output Summary (Last 24 hours) at 09/07/16 0717 Last data filed at 09/07/16 0600  Gross per 24 hour  Intake          1760.84 ml  Output                0 ml  Net          1760.84 ml     Physical Exam: CVP 15  General:  Chronically ill appearing. No resp difficulty. In bed.  HEENT: normal Neck: supple. JVP to jaw.  Carotids 2+ bilat; no bruits. No lymphadenopathy or thryomegaly appreciated. RIJ  Cor: PMI nondisplaced. Regular rate & rhythm. No rubs, gallops or murmurs. Lungs: RLL LLL crackles on 2 liters Abdomen: soft, nontender, nondistended. No hepatosplenomegaly. No bruits or masses. Good bowel sounds. Extremities: no cyanosis, clubbing, rash, edema Neuro: alert & orientedx3, cranial nerves grossly intact. moves all 4 extremities w/o  difficulty. Affect flat     Telemetry: Personally reviewed Sinus Brady 40-50s.  Labs: Basic Metabolic Panel:  Recent Labs Lab 09/04/16 0355  09/05/16 0428 09/05/16 7829 09/05/16 1818 09/06/16 0435 09/06/16 1620 09/07/16 0334 09/07/16 0336  NA 129*  < >  --  131* 127* 127* 123*  --  120*  K 4.3  < >  --  4.5 4.9 5.2* 4.5  --  4.9  CL 96*  < >  --  99* 96* 92* 91*  --  88*  CO2 24  < >  --  25 23 25 22   --  23  GLUCOSE 184*  < >  --  179* 244* 229* 205*  --  257*  BUN 16  < >  --  13 20 29* 33*  --  40*  CREATININE 2.08*  < >  --  1.71* 2.46* 3.24* 3.80*  --  4.44*  CALCIUM 8.7*  < >  --  8.9 8.8* 9.1 9.0  --  8.8*  MG 2.6*  --  2.6* 2.6*  --  2.6*  --  2.6*  --   PHOS 1.9*  < >  --  2.0* 2.3* 3.3 2.8  --  3.5  < > =  values in this interval not displayed.  Liver Function Tests:  Recent Labs Lab 09/05/16 0628 09/05/16 1818 09/06/16 0435 09/06/16 1620 09/07/16 0336  ALBUMIN 3.0* 2.9* 2.9* 3.0* 2.9*   No results for input(s): LIPASE, AMYLASE in the last 168 hours. No results for input(s): AMMONIA in the last 168 hours.  CBC:  Recent Labs Lab 09/03/16 0210 09/04/16 0355 09/05/16 0428 09/06/16 0435 09/07/16 0334  WBC 6.1 5.9 5.4 5.9 5.7  HGB 10.6* 10.9* 12.0 10.8* 10.0*  HCT 31.7* 32.9* 35.0* 31.7* 29.7*  MCV 83.2 83.9 83.7 82.1 82.3  PLT 138* 158 177 155 145*    Cardiac Enzymes: No results for input(s): CKTOTAL, CKMB, CKMBINDEX, TROPONINI in the last 168 hours.  BNP: BNP (last 3 results)  Recent Labs  09/28/15 1114  BNP 871.3*    ProBNP (last 3 results) No results for input(s): PROBNP in the last 8760 hours.    Other results:  Imaging: Ir Fluoro Guide Cv Line Right  Result Date: 09/05/2016 INDICATION: Renal failure EXAM: TEMPORARY DIALYSIS CATHETER PLACEMENT MEDICATIONS: None ANESTHESIA/SEDATION: None FLUOROSCOPY TIME:  Fluoroscopy Time:  minutes 24 seconds (1 mGy). COMPLICATIONS: None immediate. PROCEDURE: Informed written consent was  obtained from the patient after a thorough discussion of the procedural risks, benefits and alternatives. All questions were addressed. Maximal Sterile Barrier Technique was utilized including caps, mask, sterile gowns, sterile gloves, sterile drape, hand hygiene and skin antiseptic. A timeout was performed prior to the initiation of the procedure. The right neck was prepped and draped in a sterile fashion. Under sonographic guidance, a micropuncture needle was inserted into the right jugular vein and removed over a 018 wire which was up sized to an Amplatz. The temporary dialysis catheter was then advanced over the wire. It was flushed and sewn in place. FINDINGS: Image demonstrates a temporary dialysis catheter with its tip at the cavoatrial junction. IMPRESSION: Successful right jugular temporary dialysis catheter placement. Electronically Signed   By: Jolaine ClickArthur  Hoss M.D.   On: 09/05/2016 16:08   Ir Koreas Guide Vasc Access Right  Result Date: 09/05/2016 INDICATION: Renal failure EXAM: TEMPORARY DIALYSIS CATHETER PLACEMENT MEDICATIONS: None ANESTHESIA/SEDATION: None FLUOROSCOPY TIME:  Fluoroscopy Time:  minutes 24 seconds (1 mGy). COMPLICATIONS: None immediate. PROCEDURE: Informed written consent was obtained from the patient after a thorough discussion of the procedural risks, benefits and alternatives. All questions were addressed. Maximal Sterile Barrier Technique was utilized including caps, mask, sterile gowns, sterile gloves, sterile drape, hand hygiene and skin antiseptic. A timeout was performed prior to the initiation of the procedure. The right neck was prepped and draped in a sterile fashion. Under sonographic guidance, a micropuncture needle was inserted into the right jugular vein and removed over a 018 wire which was up sized to an Amplatz. The temporary dialysis catheter was then advanced over the wire. It was flushed and sewn in place. FINDINGS: Image demonstrates a temporary dialysis catheter with  its tip at the cavoatrial junction. IMPRESSION: Successful right jugular temporary dialysis catheter placement. Electronically Signed   By: Jolaine ClickArthur  Hoss M.D.   On: 09/05/2016 16:08     Medications:     Scheduled Medications: . amiodarone  200 mg Oral BID  . aspirin EC  81 mg Oral Daily  . Chlorhexidine Gluconate Cloth  6 each Topical Q0600  . insulin aspart  0-15 Units Subcutaneous TID WC  . insulin glargine  5 Units Subcutaneous Daily  . mouth rinse  15 mL Mouth Rinse BID  . pravastatin  20 mg  Oral q1800  . sildenafil  20 mg Oral TID  . sodium chloride flush  10-40 mL Intracatheter Q12H    Infusions: . heparin 1,200 Units/hr (09/07/16 0600)  . milrinone 0.375 mcg/kg/min (09/07/16 0600)  . norepinephrine (LEVOPHED) Adult infusion 5 mcg/min (09/06/16 2000)  . dialysis replacement fluid (prismasate) 300 mL/hr at 09/04/16 2000  . dialysis replacement fluid (prismasate) 300 mL/hr at 09/04/16 2000  . dialysate (PRISMASATE) 1,000 mL/hr at 09/05/16 1027  . sodium chloride 999 mL/hr at 08/29/16 1307    PRN Medications: acetaminophen, albuterol, benzonatate, chlorpheniramine-HYDROcodone, docusate sodium, hydrOXYzine, mineral oil-hydrophilic petrolatum, ondansetron (ZOFRAN) IV, sodium chloride, sodium chloride flush   Assessment/Plan/Discussion:   Jessica [name] is a 56 year old with h/o chronic systolic heart failure, ICM, Biotronik ICD, and PAH admitted today with new onset A Fib/Flutter  1. A flutter RVR: New onset June 11 with device interrogation. S/P TEE DC-CV with conversion to NSR.  -Maintaining SR.  Continue amio 200 mg twice a day. Continue heparin drip. Hgb stable.    Discussed with Pharm D  2. A/C Systolic Biventricular Heart Failure/cardiogenic shock: Ischemic cardiomyopathy.  Echo 08/24/16. LVEF 10% severe RV HK (EF down further in setting of AFL).   Remains on dual inotropes, milrinone 0.375 mcg + 5 mcg norepi. Try wean norepi.  CO-OX down to 50%.  CVP up to 15.  Off CVVHD  6/27.      - No bb or ACE/ARB/ARNI with low output and AKI .  3. Acute on chronic renal failure (baseline CKD 3) due to cardio-renal syndrome.  CVVHD started 6/20.  Temp HD cath placed. Off CVVHD. Minimal urine output. Will need to restart per Dr Gala Romney.  Creatinine rising.   4. PAH - Continue sildenafil 20 mg TID for now.  5. CAD S/P multiple interventions. No S/S ischemia  -No CP. Continue 81 mg aspirin.  No nitro with sildenafil.  - She was unable to take simvastatin due to myalgias. Continue pravastatin.   6. PAD: occluded left SFA with mild/mod abnormal ABIs. No revascularization was needed October 2017. No change.  7. H/O LV Thrombus: no thrombus on TEE. No change 8. Hyponatremia: sodium down to 120. Restrict free water. 9. Deconditioning- consult PT.   Discussed with Dr Gala Romney will need to restart CVVHD. CVP up to 15. Renal function worse.   He would like to continue over the weekend. Will need Palliative Care for GOC.   Amy Clegg NP-C  09/07/2016 7:17 AM  Agree with above.  She remains critically ill. She is anuric. Volume status back up. Co-ox low. Feels weak. Continues to require dual pressors to maintain BP. Though cardiac output remains low. HR remains in 40s. Serum sodium dropping  On exam Weak but NAD JVP up RIJ trialysis Cor brady regular +s3 Lungs mild crackles Ab soft NT ND Ext warm trivial edema  She remains critically ill. Will restart CVVHD. Continue dual pressors. Will continue CVVHD through weekend and then stop. If renal function doesn't recover will need hospice. Son struggling with situation. Will need to discuss further with both of them this weekend. Will give dose of IV lasix to see if any response. Cut amio back to 200 daily.  CRITICAL CARE Performed by: Arvilla Meres  Total critical care time: 35 minutes  Critical care time was exclusive of separately billable procedures and treating other patients.  Critical care was necessary to treat  or prevent imminent or life-threatening deterioration.  Critical care was time spent personally by me (independent of  midlevel providers or residents) on the following activities: development of treatment plan with patient and/or surrogate as well as nursing, discussions with consultants, evaluation of patient's response to treatment, examination of patient, obtaining history from patient or surrogate, ordering and performing treatments and interventions, ordering and review of laboratory studies, ordering and review of radiographic studies, pulse oximetry and re-evaluation of patient's condition.  Arvilla Meres, MD  5:16 PM

## 2016-09-07 NOTE — Progress Notes (Signed)
MD notified of blood gas results this morning & also of a short run of vtach from this morning.   No new orders received at this time.  Natahlia Hoggard E Mylinda LatinaNino Combs, [place]RN

## 2016-09-07 NOTE — Plan of Care (Signed)
Problem: Fluid Volume: Goal: Fluid volume balance will be maintained or improved Outcome: Progressing Attempting to remove 150 ml/hr  Problem: Cardiac: Goal: Cardiac symptoms related to disease process will be avoided Outcome: Progressing Pt in SB 40-50s

## 2016-09-07 NOTE — Plan of Care (Signed)
Problem: Coping: Goal: Ability to cope with  disease diagnosis and complications will improve Outcome: Completed/Met Date Met: 09/07/16 Pt has AKI but has progressed to needs CRRT. Pt EF 10-15%, COOX 45%

## 2016-09-07 NOTE — Progress Notes (Signed)
Patient got up to use BSC and when finished noticed some blood in her urine. She is currently on a heparin gtt.   RN notified Cardiologist fellow on call. No new orders received but will continue to monitor for changes.  RN to call MD if bleeding seems to worsen.   Jessica [name] E Jessica [name], [place]

## 2016-09-07 NOTE — Progress Notes (Signed)
Assessment/ Plan:   1. Acute oliguric kidney injury: secondary to decompensated HF. Remains on pressors and slow HR             Placed by on CRRT by cardiology rec today  2 Acute decompensated HF/ cardiogenic shock: on milrinone and levophed.  3. New Aflutter: s/p successful DCCV. In NSR. 4. PAH: on sildenafil 20 mg TID. 5. h/o LV thrombus: on Eliquis as outpatient; on systemic heparin.  6. Hyponatremia: worse  Subjective: Interval History: Ambulated yesterday w/PT.!   Objective: Vital signs in last 24 hours: Temp:  [97.6 F (36.4 C)-98.5 F (36.9 C)] 98 F (36.7 C) (06/29 1137) Pulse Rate:  [36-58] 56 (06/29 1137) Resp:  [13-26] 15 (06/29 1137) BP: (74-114)/(20-68) 94/50 (06/29 1137) SpO2:  [89 %-99 %] 92 % (06/29 1137) Weight:  [56.9 kg (125 lb 7.1 oz)] 56.9 kg (125 lb 7.1 oz) (06/29 0500) Weight change: 2.332 kg (5 lb 2.3 oz)  Intake/Output from previous day: 06/28 0701 - 06/29 0700 In: 1760.8 [P.O.:1200; I.V.:560.8] Out: -  Intake/Output this shift: Total I/O In: 356 [P.O.:240; I.V.:116] Out: 437 [Other:437]  General appearance: alert and cooperative Resp: rales bibasilar Cardio: regular rate and rhythm, S1, S2 normal, no murmur, click, rub or gallop Extremities: extremities normal, atraumatic, no cyanosis or edema  Lab Results:  Recent Labs  09/06/16 0435 09/07/16 0334  WBC 5.9 5.7  HGB 10.8* 10.0*  HCT 31.7* 29.7*  PLT 155 145*   BMET:  Recent Labs  09/06/16 1620 09/07/16 0336  NA 123* 120*  K 4.5 4.9  CL 91* 88*  CO2 22 23  GLUCOSE 205* 257*  BUN 33* 40*  CREATININE 3.80* 4.44*  CALCIUM 9.0 8.8*   No results for input(s): PTH in the last 72 hours. Iron Studies: No results for input(s): IRON, TIBC, TRANSFERRIN, FERRITIN in the last 72 hours. Studies/Results: Ir Fluoro Guide Cv Line Right  Result Date: 09/05/2016 INDICATION: Renal failure EXAM: TEMPORARY DIALYSIS CATHETER PLACEMENT MEDICATIONS: None ANESTHESIA/SEDATION: None  FLUOROSCOPY TIME:  Fluoroscopy Time:  minutes 24 seconds (1 mGy). COMPLICATIONS: None immediate. PROCEDURE: Informed written consent was obtained from the patient after a thorough discussion of the procedural risks, benefits and alternatives. All questions were addressed. Maximal Sterile Barrier Technique was utilized including caps, mask, sterile gowns, sterile gloves, sterile drape, hand hygiene and skin antiseptic. A timeout was performed prior to the initiation of the procedure. The right neck was prepped and draped in a sterile fashion. Under sonographic guidance, a micropuncture needle was inserted into the right jugular vein and removed over a 018 wire which was up sized to an Amplatz. The temporary dialysis catheter was then advanced over the wire. It was flushed and sewn in place. FINDINGS: Image demonstrates a temporary dialysis catheter with its tip at the cavoatrial junction. IMPRESSION: Successful right jugular temporary dialysis catheter placement. Electronically Signed   By: Jolaine ClickArthur  Hoss M.D.   On: 09/05/2016 16:08   Ir Koreas Guide Vasc Access Right  Result Date: 09/05/2016 INDICATION: Renal failure EXAM: TEMPORARY DIALYSIS CATHETER PLACEMENT MEDICATIONS: None ANESTHESIA/SEDATION: None FLUOROSCOPY TIME:  Fluoroscopy Time:  minutes 24 seconds (1 mGy). COMPLICATIONS: None immediate. PROCEDURE: Informed written consent was obtained from the patient after a thorough discussion of the procedural risks, benefits and alternatives. All questions were addressed. Maximal Sterile Barrier Technique was utilized including caps, mask, sterile gowns, sterile gloves, sterile drape, hand hygiene and skin antiseptic. A timeout was performed prior to the initiation of the procedure. The right neck was  prepped and draped in a sterile fashion. Under sonographic guidance, a micropuncture needle was inserted into the right jugular vein and removed over a 018 wire which was up sized to an Amplatz. The temporary dialysis  catheter was then advanced over the wire. It was flushed and sewn in place. FINDINGS: Image demonstrates a temporary dialysis catheter with its tip at the cavoatrial junction. IMPRESSION: Successful right jugular temporary dialysis catheter placement. Electronically Signed   By: Jolaine Click M.D.   On: 09/05/2016 16:08    Scheduled: . amiodarone  200 mg Oral BID  . aspirin EC  81 mg Oral Daily  . Chlorhexidine Gluconate Cloth  6 each Topical Q0600  . insulin aspart  0-15 Units Subcutaneous TID WC  . [START ON 09/08/2016] insulin glargine  8 Units Subcutaneous Daily  . mouth rinse  15 mL Mouth Rinse BID  . pravastatin  20 mg Oral q1800  . sildenafil  20 mg Oral TID  . sodium chloride flush  10-40 mL Intracatheter Q12H      LOS: 16 days   Hinata Diener C 09/07/2016,11:45 AM

## 2016-09-08 LAB — CBC
HCT: 30.8 % — ABNORMAL LOW (ref 36.0–46.0)
HEMOGLOBIN: 10.7 g/dL — AB (ref 12.0–15.0)
MCH: 28.2 pg (ref 26.0–34.0)
MCHC: 34.7 g/dL (ref 30.0–36.0)
MCV: 81.3 fL (ref 78.0–100.0)
Platelets: 156 10*3/uL (ref 150–400)
RBC: 3.79 MIL/uL — ABNORMAL LOW (ref 3.87–5.11)
RDW: 17 % — AB (ref 11.5–15.5)
WBC: 5.4 10*3/uL (ref 4.0–10.5)

## 2016-09-08 LAB — RENAL FUNCTION PANEL
ALBUMIN: 3 g/dL — AB (ref 3.5–5.0)
ANION GAP: 6 (ref 5–15)
Albumin: 3.1 g/dL — ABNORMAL LOW (ref 3.5–5.0)
Anion gap: 8 (ref 5–15)
BUN: 21 mg/dL — AB (ref 6–20)
BUN: 16 mg/dL (ref 6–20)
CALCIUM: 8.9 mg/dL (ref 8.9–10.3)
CO2: 26 mmol/L (ref 22–32)
CO2: 26 mmol/L (ref 22–32)
CREATININE: 2.58 mg/dL — AB (ref 0.44–1.00)
CREATININE: 2.07 mg/dL — AB (ref 0.44–1.00)
Calcium: 8.9 mg/dL (ref 8.9–10.3)
Chloride: 97 mmol/L — ABNORMAL LOW (ref 101–111)
Chloride: 98 mmol/L — ABNORMAL LOW (ref 101–111)
GFR calc Af Amer: 22 mL/min — ABNORMAL LOW (ref >60)
GFR, EST AFRICAN AMERICAN: 29 mL/min — AB (ref >60)
GFR, EST NON AFRICAN AMERICAN: 19 mL/min — AB (ref >60)
GFR, EST NON AFRICAN AMERICAN: 25 mL/min — AB (ref >60)
GLUCOSE: 131 mg/dL — AB (ref 65–99)
Glucose, Bld: 190 mg/dL — ABNORMAL HIGH (ref 65–99)
PHOSPHORUS: 2.6 mg/dL (ref 2.5–4.6)
POTASSIUM: 4.8 mmol/L (ref 3.5–5.1)
Phosphorus: 1.8 mg/dL — ABNORMAL LOW (ref 2.5–4.6)
Potassium: 4.5 mmol/L (ref 3.5–5.1)
SODIUM: 131 mmol/L — AB (ref 135–145)
SODIUM: 130 mmol/L — AB (ref 135–145)

## 2016-09-08 LAB — COOXEMETRY PANEL
Carboxyhemoglobin: 1.6 % — ABNORMAL HIGH (ref 0.5–1.5)
Methemoglobin: 1.3 % (ref 0.0–1.5)
O2 Saturation: 48.4 %
TOTAL HEMOGLOBIN: 10.7 g/dL — AB (ref 12.0–16.0)

## 2016-09-08 LAB — GLUCOSE, CAPILLARY
GLUCOSE-CAPILLARY: 155 mg/dL — AB (ref 65–99)
GLUCOSE-CAPILLARY: 157 mg/dL — AB (ref 65–99)
Glucose-Capillary: 182 mg/dL — ABNORMAL HIGH (ref 65–99)

## 2016-09-08 LAB — HEPARIN LEVEL (UNFRACTIONATED): HEPARIN UNFRACTIONATED: 0.65 [IU]/mL (ref 0.30–0.70)

## 2016-09-08 LAB — MAGNESIUM: MAGNESIUM: 2.7 mg/dL — AB (ref 1.7–2.4)

## 2016-09-08 MED ORDER — FUROSEMIDE 10 MG/ML IJ SOLN
160.0000 mg | Freq: Once | INTRAVENOUS | Status: AC
  Administered 2016-09-08: 160 mg via INTRAVENOUS
  Filled 2016-09-08 (×2): qty 16

## 2016-09-08 NOTE — Progress Notes (Signed)
ANTICOAGULATION CONSULT NOTE - Follow Up Consult  Pharmacy Consult for Heparin Indication: atrial fibrillation  Allergies  Allergen Reactions  . Nyquil Multi-Symptom [Pseudoeph-Doxylamine-Dm-Apap] Other (See Comments)    Fatigue, chest pressure and pain Pt tolerates Coricidin cough syrup at home  . Sudafed [Pseudoephedrine] Other (See Comments)    Fatigue, chest pressure and pain  . Lasix [Furosemide]     Rash  . Torsemide     Rash  . Zocor [Simvastatin]     Myalgias   . Latex Rash  . Ramipril Cough    Patient Measurements: Height: 5\' 5"  (165.1 cm) Weight: 120 lb 4.8 oz (54.6 kg) IBW/kg (Calculated) : 57  Vital Signs: Temp: 97.9 F (36.6 C) (06/30 0723) Temp Source: Oral (06/30 0723) BP: 111/58 (06/30 0700) Pulse Rate: 57 (06/30 0700)  Labs:  Recent Labs  09/06/16 0435  09/06/16 2015 09/07/16 0333 09/07/16 0334 09/07/16 0336 09/07/16 1552 09/08/16 0400  HGB 10.8*  --   --   --  10.0*  --   --  10.7*  HCT 31.7*  --   --   --  29.7*  --   --  30.8*  PLT 155  --   --   --  145*  --   --  156  HEPARINUNFRC  --   --  0.37 0.48  --   --   --  0.65  CREATININE 3.24*  < >  --   --   --  4.44* 3.23* 2.58*  < > = values in this interval not displayed.  Estimated Creatinine Clearance: 20.5 mL/min (A) (by C-G formula based on SCr of 2.58 mg/dL (H)).  Assessment: 56yof on eliquis for afib, s/p DCCV on 6/18, transitioned to IV heparin 6/20 anticipating CVVHD. CVVHD stopped 6/27, temp IJ catheter placed 6/28, heparin resumed, and CVVHD resumed 6/29.   Heparin level is therapeutic at 0.6 this am. CBC stable. No bleeding noted.  Goal of Therapy:  Heparin level 0.3-0.7 units/ml Monitor platelets by anticoagulation protocol: Yes   Plan:  1) Continue heparin at 1200 units/hr 2) Daily heparin level and CBC 3) [facility]  Sheppard CoilFrank Jawanza Zambito PharmD., BCPS Clinical Pharmacist Pager [phone] 09/08/2016 7:49 AM

## 2016-09-08 NOTE — Plan of Care (Signed)
Problem: Activity: Goal: Ability to tolerate increased activity will improve Outcome: Progressing Up to chair with minimal assist

## 2016-09-08 NOTE — Progress Notes (Signed)
Patient ID: Jessica [name], female   DOB: 07-Jan-1961, 56 y.o.   MRN: [id number]   Advanced Heart Failure Rounding Note   Subjective:    Admitted with new onset atrial flutter and ADHF. Milrinone started 6/15 for worsening HF and renal failure.  S/P TEE and successful DC-CV.   Moved to ICU on 6/19 due to low output and worsening AKI. CVVHD catheter placed. Remains on CVVHD. Pulling 150 cc per hour. Urine output 250cc.    CVVHD stopped 6/27. Resumed 6/29.   She continues on milrinone 0.375 mcg and norepi 5 mcg. Todays CO-OX is 48%.  CVP 13. 200cc urine output yesterday with 160IV lasix x 1.  Complaining of fatigue. Denies SOB.     Objective:   Weight Range:  Vital Signs:   Temp:  [96.3 F (35.7 C)-98.9 F (37.2 C)] 97.9 F (36.6 C) (06/30 0723) Pulse Rate:  [31-64] 62 (06/30 0900) Resp:  [13-24] 19 (06/30 0900) BP: (89-124)/(44-65) 110/56 (06/30 0900) SpO2:  [88 %-100 %] 97 % (06/30 0900) Weight:  [54.6 kg (120 lb 4.8 oz)] 54.6 kg (120 lb 4.8 oz) (06/30 0500) Last BM Date: 09/07/16  Weight change: Filed Weights   09/06/16 0625 09/07/16 0500 09/08/16 0500  Weight: 54.6 kg (120 lb 4.8 oz) 56.9 kg (125 lb 7.1 oz) 54.6 kg (120 lb 4.8 oz)    Intake/Output:   Intake/Output Summary (Last 24 hours) at 09/08/16 0950 Last data filed at 09/08/16 0900  Gross per 24 hour  Intake            916.8 ml  Output             4406 ml  Net          -3489.2 ml     Physical Exam: CVP 13  General:  Sitting in bed  Chronically ill appearing. No resp difficulty. HEENT: normal anicteric  Neck: supple. JVP to jaw. RIJ cath   Carotids 2+ bilat; no bruits. No lymphadenopathy or thryomegaly appreciated.  Cor: PMI laterally displaced. Huston Foley regular +s3  Abrasion over ICD site Lungs: basilar crackles  Abdomen: soft, NT/ND. Good bs No hepatosplenomegaly. No bruits or masses. Good bowel sounds. Extremities: no cyanosis, clubbing, rash, edema Neuro: alert & oriented x 3, cranial nerves grossly  intact. moves all 4 extremities w/o difficulty. Affect pleasant    Telemetry: Personally reviewed Sinus Huston Foley 50-60s Personally reviewed . Labs: Basic Metabolic Panel:  Recent Labs Lab 09/05/16 0428 09/05/16 4098  09/06/16 0435 09/06/16 1620 09/07/16 0334 09/07/16 0336 09/07/16 1552 09/08/16 0400  NA  --  131*  < > 127* 123*  --  120* 123* 131*  K  --  4.5  < > 5.2* 4.5  --  4.9 4.5 4.8  CL  --  99*  < > 92* 91*  --  88* 93* 97*  CO2  --  25  < > 25 22  --  23 20* 26  GLUCOSE  --  179*  < > 229* 205*  --  257* 156* 131*  BUN  --  13  < > 29* 33*  --  40* 30* 21*  CREATININE  --  1.71*  < > 3.24* 3.80*  --  4.44* 3.23* 2.58*  CALCIUM  --  8.9  < > 9.1 9.0  --  8.8* 8.6* 8.9  MG 2.6* 2.6*  --  2.6*  --  2.6*  --   --  2.7*  PHOS  --  2.0*  < >  3.3 2.8  --  3.5 2.6 2.6  < > = values in this interval not displayed.  Liver Function Tests:  Recent Labs Lab 09/06/16 0435 09/06/16 1620 09/07/16 0336 09/07/16 1552 09/08/16 0400  ALBUMIN 2.9* 3.0* 2.9* 3.0* 3.0*   No results for input(s): LIPASE, AMYLASE in the last 168 hours. No results for input(s): AMMONIA in the last 168 hours.  CBC:  Recent Labs Lab 09/04/16 0355 09/05/16 0428 09/06/16 0435 09/07/16 0334 09/08/16 0400  WBC 5.9 5.4 5.9 5.7 5.4  HGB 10.9* 12.0 10.8* 10.0* 10.7*  HCT 32.9* 35.0* 31.7* 29.7* 30.8*  MCV 83.9 83.7 82.1 82.3 81.3  PLT 158 177 155 145* 156    Cardiac Enzymes: No results for input(s): CKTOTAL, CKMB, CKMBINDEX, TROPONINI in the last 168 hours.  BNP: BNP (last 3 results)  Recent Labs  09/28/15 1114  BNP 871.3*    ProBNP (last 3 results) No results for input(s): PROBNP in the last 8760 hours.    Other results:  Imaging: No results found.   Medications:     Scheduled Medications: . amiodarone  200 mg Oral Daily  . aspirin EC  81 mg Oral Daily  . Chlorhexidine Gluconate Cloth  6 each Topical Q0600  . insulin aspart  0-15 Units Subcutaneous TID WC  . insulin  glargine  8 Units Subcutaneous Daily  . pravastatin  20 mg Oral q1800  . sildenafil  20 mg Oral TID  . sodium chloride flush  10-40 mL Intracatheter Q12H    Infusions: . furosemide    . heparin 1,200 Units/hr (09/08/16 0900)  . milrinone 0.375 mcg/kg/min (09/08/16 0900)  . norepinephrine (LEVOPHED) Adult infusion 5 mcg/min (09/08/16 0900)  . dialysis replacement fluid (prismasate) 300 mL/hr at 09/08/16 0220  . dialysis replacement fluid (prismasate) 300 mL/hr at 09/08/16 0219  . dialysate (PRISMASATE) 1,000 mL/hr at 09/08/16 0913  . sodium chloride      PRN Medications: acetaminophen, albuterol, benzonatate, chlorpheniramine-HYDROcodone, docusate sodium, hydrOXYzine, mineral oil-hydrophilic petrolatum, ondansetron (ZOFRAN) IV, sodium chloride, sodium chloride flush   Assessment/Plan/Discussion:   [name] is a 56 year old with h/o chronic systolic heart failure, ICM, Biotronik ICD, and PAH admitted today with new onset A Fib/Flutter  1. A/C Systolic Biventricular Heart Failure -> cardiogenic shock:  -- Ischemic cardiomyopathy.  Echo 08/24/16. LVEF 10% severe RV HK (EF down further in setting of AFL).   -- Remains on dual inotropes, milrinone 0.375 mcg + 5 mcg norepi. Co-ox continues to fall Will uptitrate NE --Volume status elevated. Continue CVHD - No bb or ACE/ARB/ARNI with low output and AKI .   2. A flutter RVR:  -- New onset June 11 with device interrogation. S/P TEE DC-CV 6/18 with conversion to NSR.  -- Maintaining SR. Very bradycardic. Amio turned down to 200 daily Continue heparin drip. (Was on Eliquis at home) Hgb stable.    Discussed with PharmD  3. Acute on chronic renal failure (baseline CKD 3) due to cardio-renal syndrome.   -- CVVHD started 6/20. Restarted 6/29 -- Volume status still up. Continue CVVHD  -- Little signs of renal recovery. Received 160mg  IV lasix yesterday with minimal U/O. Will repeat today -- Not candidate for iHD due to severe HF.   4. PAH -  Continue sildenafil 20 mg TID for now .  5. CAD S/P multiple interventions.  - No s/s ischemia. Continue 81 mg aspirin.   - She was unable to take simvastatin due to myalgias. Continue pravastatin.    6. PAD:  occluded left SFA with mild/mod abnormal ABIs. No revascularization was needed October 2017. No change.   7. H/O LV Thrombus: no thrombus on TEE. Remains on heparin for AFL  8. Hyponatremia: sodium improved 120 -> 131Restrict free water.  9. Deconditioning- consult PT.    She continues with progressive biventricular shock. No encouraging signs of renal recovery. Will continue CVVHD over the weekend and get volume under control. Likely hold CVVHD early next week and reassess for renal recovery. If no recovery, Hospice likely only option.   CRITICAL CARE Performed by: Arvilla Meres  Total critical care time: 35 minutes  Critical care time was exclusive of separately billable procedures and treating other patients.  Critical care was necessary to treat or prevent imminent or life-threatening deterioration.  Critical care was time spent personally by me (independent of midlevel providers or residents) on the following activities: development of treatment plan with patient and/or surrogate as well as nursing, discussions with consultants, evaluation of patient's response to treatment, examination of patient, obtaining history from patient or surrogate, ordering and performing treatments and interventions, ordering and review of laboratory studies, ordering and review of radiographic studies, pulse oximetry and re-evaluation of patient's condition.    Arvilla Meres MD 09/08/2016 9:50 AM

## 2016-09-08 NOTE — Progress Notes (Signed)
Assessment/ Plan:   1. Oliguric kidney injury on CKD3: secondary to decompensated HF. Remains on pressors and slow HR On CRRT.  I think there is possibility of renal recovery, given enough time, which may take need for IHD out of picture 2 Decompensated HF/ cardiogenic shock: on milrinone and levophed.  3. New Aflutter: s/p successful DCCV. In NSR. 4. PAH: on sildenafil 20 mg TID. 5. h/o LV thrombus: on Eliquis as outpatient; on systemic heparin.  6. Hyponatremia: improving  Subjective: Interval History: Did not get OOB  Objective: Vital signs in last 24 hours: Temp:  [96.3 F (35.7 C)-99 F (37.2 C)] 99 F (37.2 C) (06/30 1129) Pulse Rate:  [31-64] 56 (06/30 1300) Resp:  [14-24] 14 (06/30 1300) BP: (89-124)/(49-83) 109/57 (06/30 1300) SpO2:  [88 %-100 %] 97 % (06/30 1300) Weight:  [54.6 kg (120 lb 4.8 oz)] 54.6 kg (120 lb 4.8 oz) (06/30 0500) Weight change: -2.332 kg (-5 lb 2.3 oz)  Intake/Output from previous day: 06/29 0701 - 06/30 0700 In: 940 [P.O.:360; I.V.:580] Out: 4143 [Urine:200] Intake/Output this shift: Total I/O In: 453.6 [P.O.:240; I.V.:147.6; IV Piggyback:66] Out: 1157 [Other:1157]  General appearance: alert and cooperative Neck: supple, symmetrical, trachea midline, thyroid not enlarged, symmetric,  JVD is present Resp: clear Chest wall: NT Cardio: RRR Extremities: NCCE  Lab Results:  Recent Labs  09/07/16 0334 09/08/16 0400  WBC 5.7 5.4  HGB 10.0* 10.7*  HCT 29.7* 30.8*  PLT 145* 156   BMET:  Recent Labs  09/07/16 1552 09/08/16 0400  NA 123* 131*  K 4.5 4.8  CL 93* 97*  CO2 20* 26  GLUCOSE 156* 131*  BUN 30* 21*  CREATININE 3.23* 2.58*  CALCIUM 8.6* 8.9   No results for input(s): PTH in the last 72 hours. Iron Studies: No results for input(s): IRON, TIBC, TRANSFERRIN, FERRITIN in the last 72 hours. Studies/Results: No results found.  Scheduled: . amiodarone  200 mg Oral Daily  . aspirin EC  81 mg Oral  Daily  . Chlorhexidine Gluconate Cloth  6 each Topical Q0600  . insulin aspart  0-15 Units Subcutaneous TID WC  . insulin glargine  8 Units Subcutaneous Daily  . pravastatin  20 mg Oral q1800  . sildenafil  20 mg Oral TID  . sodium chloride flush  10-40 mL Intracatheter Q12H    LOS: 17 days   Hermenegildo Clausen C 09/08/2016,1:59 PM

## 2016-09-09 DIAGNOSIS — J9600 Acute respiratory failure, unspecified whether with hypoxia or hypercapnia: Secondary | ICD-10-CM

## 2016-09-09 HISTORY — DX: Acute respiratory failure, unspecified whether with hypoxia or hypercapnia: J96.00

## 2016-09-09 LAB — RENAL FUNCTION PANEL
ANION GAP: 7 (ref 5–15)
Albumin: 2.9 g/dL — ABNORMAL LOW (ref 3.5–5.0)
BUN: 15 mg/dL (ref 6–20)
CHLORIDE: 97 mmol/L — AB (ref 101–111)
CO2: 26 mmol/L (ref 22–32)
Calcium: 8.8 mg/dL — ABNORMAL LOW (ref 8.9–10.3)
Creatinine, Ser: 1.98 mg/dL — ABNORMAL HIGH (ref 0.44–1.00)
GFR calc Af Amer: 31 mL/min — ABNORMAL LOW (ref >60)
GFR calc non Af Amer: 27 mL/min — ABNORMAL LOW (ref >60)
Glucose, Bld: 216 mg/dL — ABNORMAL HIGH (ref 65–99)
POTASSIUM: 4.4 mmol/L (ref 3.5–5.1)
Phosphorus: 2 mg/dL — ABNORMAL LOW (ref 2.5–4.6)
Sodium: 130 mmol/L — ABNORMAL LOW (ref 135–145)

## 2016-09-09 LAB — CBC
HEMATOCRIT: 30.8 % — AB (ref 36.0–46.0)
HEMOGLOBIN: 10.3 g/dL — AB (ref 12.0–15.0)
MCH: 27.8 pg (ref 26.0–34.0)
MCHC: 33.4 g/dL (ref 30.0–36.0)
MCV: 83.2 fL (ref 78.0–100.0)
Platelets: 173 10*3/uL (ref 150–400)
RBC: 3.7 MIL/uL — AB (ref 3.87–5.11)
RDW: 17.1 % — ABNORMAL HIGH (ref 11.5–15.5)
WBC: 6.7 10*3/uL (ref 4.0–10.5)

## 2016-09-09 LAB — COOXEMETRY PANEL
CARBOXYHEMOGLOBIN: 2.8 % — AB (ref 0.5–1.5)
METHEMOGLOBIN: 0.8 % (ref 0.0–1.5)
O2 SAT: 62.8 %
TOTAL HEMOGLOBIN: 10.9 g/dL — AB (ref 12.0–16.0)

## 2016-09-09 LAB — GLUCOSE, CAPILLARY
GLUCOSE-CAPILLARY: 190 mg/dL — AB (ref 65–99)
Glucose-Capillary: 192 mg/dL — ABNORMAL HIGH (ref 65–99)

## 2016-09-09 LAB — MAGNESIUM: MAGNESIUM: 2.5 mg/dL — AB (ref 1.7–2.4)

## 2016-09-09 LAB — HEPARIN LEVEL (UNFRACTIONATED): Heparin Unfractionated: 0.51 IU/mL (ref 0.30–0.70)

## 2016-09-09 MED ORDER — HEPARIN SODIUM (PORCINE) 1000 UNIT/ML DIALYSIS
1000.0000 [IU] | INTRAMUSCULAR | Status: DC | PRN
  Administered 2016-09-09: 2800 [IU] via INTRAVENOUS_CENTRAL
  Filled 2016-09-09 (×2): qty 6

## 2016-09-09 NOTE — Progress Notes (Signed)
Assessment/ Plan:   1. Oliguric kidney injury on CKD3: secondary to decompensated HF. Remains on pressors and has bradycardia On CRRT. Vol down by parameters will hold CRRT a couple of days. I think there is possibility of renal recovery, given enough time, which may take need for IHD out of picture 2 Decompensated HF/ cardiogenic shock: on milrinone and levophed.  3. New Aflutter: s/p successful DCCV. In NSR. 4. PAH: on sildenafil 20 mg TID. 5.  h/o LV thrombus: on Eliquis as outpatient; on systemic heparin.  6. Hyponatremia: improving  Subjective: Interval History: Up in chair yesterday  Objective: Vital signs in last 24 hours: Temp:  [99 F (37.2 C)-99.7 F (37.6 C)] 99.3 F (37.4 C) (07/01 0700) Pulse Rate:  [48-68] 61 (07/01 0800) Resp:  [14-26] 26 (07/01 0800) BP: (87-130)/(38-83) 96/57 (07/01 0800) SpO2:  [89 %-99 %] 91 % (07/01 0800) Weight:  [53.4 kg (117 lb 11.2 oz)] 53.4 kg (117 lb 11.2 oz) (07/01 0500) Weight change: -1.179 kg (-2 lb 9.6 oz)  Intake/Output from previous day: 06/30 0701 - 07/01 0700 In: 1341.6 [P.O.:660; I.V.:615.6; IV Piggyback:66] Out: 4155 [Urine:300] Intake/Output this shift: Total I/O In: 26 [I.V.:26] Out: 89 [Other:89]  General appearance: alert and cooperative  Cor RR Lungs failrly clear and diminished Ext no edema  Lab Results:  Recent Labs  09/08/16 0400 09/09/16 0402  WBC 5.4 6.7  HGB 10.7* 10.3*  HCT 30.8* 30.8*  PLT 156 173   BMET:  Recent Labs  09/08/16 1649 09/09/16 0402  NA 130* 130*  K 4.5 4.4  CL 98* 97*  CO2 26 26  GLUCOSE 190* 216*  BUN 16 15  CREATININE 2.07* 1.98*  CALCIUM 8.9 8.8*   No results for input(s): PTH in the last 72 hours. Iron Studies: No results for input(s): IRON, TIBC, TRANSFERRIN, FERRITIN in the last 72 hours. Studies/Results: No results found.  Scheduled: . amiodarone  200 mg Oral Daily  . aspirin EC  81 mg Oral Daily  . Chlorhexidine Gluconate Cloth  6 each  Topical Q0600  . insulin aspart  0-15 Units Subcutaneous TID WC  . insulin glargine  8 Units Subcutaneous Daily  . pravastatin  20 mg Oral q1800  . sildenafil  20 mg Oral TID  . sodium chloride flush  10-40 mL Intracatheter Q12H    LOS: 18 days   Erasmo Vertz C 09/09/2016,8:42 AM

## 2016-09-09 NOTE — Progress Notes (Signed)
Patient ID: Jessica [name], female   DOB: March 30, 1960, 56 y.o.   MRN: [id number]   Advanced Heart Failure Rounding Note   Subjective:    Admitted with new onset atrial flutter and ADHF. Milrinone started 6/15 for worsening HF and renal failure.  S/P TEE and successful DC-CV.   Moved to ICU on 6/19 due to low output and worsening AKI. CVVHD catheter placed. Remains on CVVHD. Pulling 150 cc per hour. Urine output 250cc.    CVVHD stopped 6/27. Resumed 6/29.  Yesterday Norep increased to 8 due to low co-ox 48%.  Resting comfortably today. Feels good. Co-ox 63%  CVP 3-4 Denies dyspnea   Objective:   Weight Range:  Vital Signs:   Temp:  [99 F (37.2 C)-99.7 F (37.6 C)] 99.3 F (37.4 C) (07/01 0700) Pulse Rate:  [48-68] 61 (07/01 0800) Resp:  [14-26] 26 (07/01 0800) BP: (87-130)/(38-83) 96/57 (07/01 0800) SpO2:  [89 %-99 %] 91 % (07/01 0800) Weight:  [53.4 kg (117 lb 11.2 oz)] 53.4 kg (117 lb 11.2 oz) (07/01 0500) Last BM Date: 09/07/16  Weight change: Filed Weights   09/07/16 0500 09/08/16 0500 09/09/16 0500  Weight: 56.9 kg (125 lb 7.1 oz) 54.6 kg (120 lb 4.8 oz) 53.4 kg (117 lb 11.2 oz)    Intake/Output:   Intake/Output Summary (Last 24 hours) at 09/09/16 0835 Last data filed at 09/09/16 0800  Gross per 24 hour  Intake           1344.4 ml  Output             4070 ml  Net          -2725.6 ml     Physical Exam: CVP 4 General:  Sitting in bed  Chronically ill appearing. No resp difficulty. HEENT: normal anicteric  Neck: supple. JVP flat . RIJ dialysis cath   Carotids 2+ bilat; no bruits. No lymphadenopathy or thryomegaly appreciated.  Cor: PMI laterally displaced. RRR +s3  Abrasion over ICD site Lungs: clear Abdomen: soft, NT/ND. Good bs No hepatosplenomegaly. No bruits or masses. Good bowel sounds. Extremities: no cyanosis, clubbing, rash, edema Neuro: alert & oriented x 3, cranial nerves grossly intact. moves all 4 extremities w/o difficulty. Affect pleasant      Telemetry: Sinus 60-65 +occasioanl PVCs. Personally reviewed  . Labs: Basic Metabolic Panel:  Recent Labs Lab 09/05/16 0628  09/06/16 0435  09/07/16 0334 09/07/16 0336 09/07/16 1552 09/08/16 0400 09/08/16 1649 09/09/16 0402  NA 131*  < > 127*  < >  --  120* 123* 131* 130* 130*  K 4.5  < > 5.2*  < >  --  4.9 4.5 4.8 4.5 4.4  CL 99*  < > 92*  < >  --  88* 93* 97* 98* 97*  CO2 25  < > 25  < >  --  23 20* 26 26 26   GLUCOSE 179*  < > 229*  < >  --  257* 156* 131* 190* 216*  BUN 13  < > 29*  < >  --  40* 30* 21* 16 15  CREATININE 1.71*  < > 3.24*  < >  --  4.44* 3.23* 2.58* 2.07* 1.98*  CALCIUM 8.9  < > 9.1  < >  --  8.8* 8.6* 8.9 8.9 8.8*  MG 2.6*  --  2.6*  --  2.6*  --   --  2.7*  --  2.5*  PHOS 2.0*  < > 3.3  < >  --  3.5 2.6 2.6 1.8* 2.0*  < > = values in this interval not displayed.  Liver Function Tests:  Recent Labs Lab 09/07/16 0336 09/07/16 1552 09/08/16 0400 09/08/16 1649 09/09/16 0402  ALBUMIN 2.9* 3.0* 3.0* 3.1* 2.9*   No results for input(s): LIPASE, AMYLASE in the last 168 hours. No results for input(s): AMMONIA in the last 168 hours.  CBC:  Recent Labs Lab 09/05/16 0428 09/06/16 0435 09/07/16 0334 09/08/16 0400 09/09/16 0402  WBC 5.4 5.9 5.7 5.4 6.7  HGB 12.0 10.8* 10.0* 10.7* 10.3*  HCT 35.0* 31.7* 29.7* 30.8* 30.8*  MCV 83.7 82.1 82.3 81.3 83.2  PLT 177 155 145* 156 173    Cardiac Enzymes: No results for input(s): CKTOTAL, CKMB, CKMBINDEX, TROPONINI in the last 168 hours.  BNP: BNP (last 3 results)  Recent Labs  09/28/15 1114  BNP 871.3*    ProBNP (last 3 results) No results for input(s): PROBNP in the last 8760 hours.    Other results:  Imaging: No results found.   Medications:     Scheduled Medications: . amiodarone  200 mg Oral Daily  . aspirin EC  81 mg Oral Daily  . Chlorhexidine Gluconate Cloth  6 each Topical Q0600  . insulin aspart  0-15 Units Subcutaneous TID WC  . insulin glargine  8 Units Subcutaneous  Daily  . pravastatin  20 mg Oral q1800  . sildenafil  20 mg Oral TID  . sodium chloride flush  10-40 mL Intracatheter Q12H    Infusions: . heparin 1,200 Units/hr (09/09/16 0800)  . milrinone 0.375 mcg/kg/min (09/09/16 0800)  . norepinephrine (LEVOPHED) Adult infusion 8 mcg/min (09/09/16 0800)  . dialysis replacement fluid (prismasate) 300 mL/hr at 09/08/16 1748  . dialysis replacement fluid (prismasate) 300 mL/hr at 09/08/16 1748  . dialysate (PRISMASATE) 1,000 mL/hr at 09/09/16 0406  . sodium chloride      PRN Medications: acetaminophen, albuterol, benzonatate, chlorpheniramine-HYDROcodone, docusate sodium, hydrOXYzine, mineral oil-hydrophilic petrolatum, ondansetron (ZOFRAN) IV, sodium chloride, sodium chloride flush   Assessment/Plan/Discussion:   Jessica [name] is a 56 year old with h/o chronic systolic heart failure, ICM, Biotronik ICD, and PAH admitted today with new onset A Fib/Flutter  1. A/C Systolic Biventricular Heart Failure -> cardiogenic shock:  -- Ischemic cardiomyopathy.  Echo 08/24/16. LVEF 10% severe RV HK (EF down further in setting of AFL).   -- Remains on dual inotropes, milrinone 0.375 mcg + 8 mcg norepi. Co-ox improved with increasing NE yesterday. Will continue --Volume status low. Hold CVVHD for now  - No bb or ACE/ARB/ARNI with low output and AKI .   2. A flutter RVR:  -- New onset June 11 with device interrogation. S/P TEE DC-CV 6/18 with conversion to NSR.  -- Maintaining SR. Amio turned down to 200 daily several days ago. HR now improved in 60s. Continue heparin drip. (Was on Eliquis at home) Hgb 10.3 today.    --Discussed with PharmD   3. Acute on chronic renal failure (baseline CKD 3) due to cardio-renal syndrome.  -- CVVHD started 6/20. Restarted 6/29 -- Received 160mg  IV lasix yesterday with 300cc urine out -- Volume status low today. D/w Dr. Lowell GuitarPowell at the bedside. Will hold CVVHD until CVP climbs back up to 15-20 range -- I agree with Dr. Lowell GuitarPowell that  we should give her more time for renal recovery. Hold CVVHD for now -- Not candidate for iHD due to severe HF.   4. PAH - Continue sildenafil 20 mg TID for now .  5. CAD S/P  multiple interventions.  - No ss/ ischemia.  Continue 81 mg aspirin.   - She was unable to take simvastatin due to myalgias. Continue pravastatin.    6. PAD: occluded left SFA with mild/mod abnormal ABIs. No revascularization was needed October 2017. No change.   7. H/O LV Thrombus: no thrombus on TEE. Remains on heparin for AFL  8. Hyponatremia:  -- Serum sodium 130 today  9. Deconditioning - consult PT.   I spoke with her and both her sons yesterday. They understand that renal recovery has been sluggish  Will hold CVVHD over the weekend and let CVP climb. Will continue to wait for renal recovery and reassess for renal recovery. If no recovery, Hospice likely only option.   CRITICAL CARE Performed by: Arvilla Meres  Total critical care time: 35 minutes  Critical care time was exclusive of separately billable procedures and treating other patients.  Critical care was necessary to treat or prevent imminent or life-threatening deterioration.  Critical care was time spent personally by me (independent of midlevel providers or residents) on the following activities: development of treatment plan with patient and/or surrogate as well as nursing, discussions with consultants, evaluation of patient's response to treatment, examination of patient, obtaining history from patient or surrogate, ordering and performing treatments and interventions, ordering and review of laboratory studies, ordering and review of radiographic studies, pulse oximetry and re-evaluation of patient's condition.     Arvilla Meres MD 09/09/2016 8:35 AM

## 2016-09-09 NOTE — Progress Notes (Signed)
ANTICOAGULATION CONSULT NOTE - Follow Up Consult  Pharmacy Consult for Heparin Indication: atrial fibrillation  Allergies  Allergen Reactions  . Nyquil Multi-Symptom [Pseudoeph-Doxylamine-Dm-Apap] Other (See Comments)    Fatigue, chest pressure and pain Pt tolerates Coricidin cough syrup at home  . Sudafed [Pseudoephedrine] Other (See Comments)    Fatigue, chest pressure and pain  . Lasix [Furosemide]     Rash  . Torsemide     Rash  . Zocor [Simvastatin]     Myalgias   . Latex Rash  . Ramipril Cough    Patient Measurements: Height: 5\' 5"  (165.1 cm) Weight: 117 lb 11.2 oz (53.4 kg) IBW/kg (Calculated) : 57  Vital Signs: Temp: 99.3 F (37.4 C) (07/01 0700) Temp Source: Oral (07/01 0700) BP: 114/60 (07/01 0700) Pulse Rate: 62 (07/01 0700)  Labs:  Recent Labs  09/07/16 0333  09/07/16 0334  09/08/16 0400 09/08/16 1649 09/09/16 0402  HGB  --   < > 10.0*  --  10.7*  --  10.3*  HCT  --   --  29.7*  --  30.8*  --  30.8*  PLT  --   --  145*  --  156  --  173  HEPARINUNFRC 0.48  --   --   --  0.65  --  0.51  CREATININE  --   --   --   < > 2.58* 2.07* 1.98*  < > = values in this interval not displayed.  Estimated Creatinine Clearance: 26.1 mL/min (A) (by C-G formula based on SCr of 1.98 mg/dL (H)).  Assessment: 56yof on eliquis for afib, s/p DCCV on 6/18, transitioned to IV heparin 6/20 anticipating CVVHD. CVVHD stopped 6/27, temp IJ catheter placed 6/28, heparin resumed, and CVVHD resumed 6/29.   Heparin level is therapeutic at 0.51 this am. CBC stable. No bleeding noted.  Goal of Therapy:  Heparin level 0.3-0.7 units/ml Monitor platelets by anticoagulation protocol: Yes   Plan:  1) Continue heparin at 1200 units/hr 2) Daily heparin level and CBC 3) Follow up plan for oral anticoagulation eventually.  Tad MooreJessica Brezlyn Manrique, Pharm D, BCPS  Clinical Pharmacist Pager [phone]  09/09/2016 7:50 AM

## 2016-09-10 ENCOUNTER — Inpatient Hospital Stay (HOSPITAL_COMMUNITY): Payer: Medicare Other

## 2016-09-10 LAB — RENAL FUNCTION PANEL
Albumin: 2.8 g/dL — ABNORMAL LOW (ref 3.5–5.0)
Albumin: 2.8 g/dL — ABNORMAL LOW (ref 3.5–5.0)
Anion gap: 7 (ref 5–15)
Anion gap: 8 (ref 5–15)
BUN: 25 mg/dL — ABNORMAL HIGH (ref 6–20)
BUN: 31 mg/dL — AB (ref 6–20)
CALCIUM: 8.9 mg/dL (ref 8.9–10.3)
CHLORIDE: 93 mmol/L — AB (ref 101–111)
CO2: 25 mmol/L (ref 22–32)
CO2: 23 mmol/L (ref 22–32)
CREATININE: 3.18 mg/dL — AB (ref 0.44–1.00)
Calcium: 8.9 mg/dL (ref 8.9–10.3)
Chloride: 92 mmol/L — ABNORMAL LOW (ref 101–111)
Creatinine, Ser: 3.47 mg/dL — ABNORMAL HIGH (ref 0.44–1.00)
GFR calc Af Amer: 17 mL/min — ABNORMAL LOW (ref >60)
GFR calc Af Amer: 16 mL/min — ABNORMAL LOW (ref >60)
GFR calc non Af Amer: 15 mL/min — ABNORMAL LOW (ref >60)
GFR, EST NON AFRICAN AMERICAN: 14 mL/min — AB (ref >60)
GLUCOSE: 318 mg/dL — AB (ref 65–99)
GLUCOSE: 314 mg/dL — AB (ref 65–99)
POTASSIUM: 4.7 mmol/L (ref 3.5–5.1)
Phosphorus: 3.2 mg/dL (ref 2.5–4.6)
Phosphorus: 3.4 mg/dL (ref 2.5–4.6)
Potassium: 4.4 mmol/L (ref 3.5–5.1)
SODIUM: 124 mmol/L — AB (ref 135–145)
Sodium: 124 mmol/L — ABNORMAL LOW (ref 135–145)

## 2016-09-10 LAB — URINALYSIS, ROUTINE W REFLEX MICROSCOPIC
BILIRUBIN URINE: NEGATIVE
Glucose, UA: NEGATIVE mg/dL
Ketones, ur: NEGATIVE mg/dL
Nitrite: NEGATIVE
PH: 5 (ref 5.0–8.0)
Protein, ur: NEGATIVE mg/dL
SPECIFIC GRAVITY, URINE: 1.011 (ref 1.005–1.030)

## 2016-09-10 LAB — HEPARIN LEVEL (UNFRACTIONATED): HEPARIN UNFRACTIONATED: 0.32 [IU]/mL (ref 0.30–0.70)

## 2016-09-10 LAB — MAGNESIUM: MAGNESIUM: 2.2 mg/dL (ref 1.7–2.4)

## 2016-09-10 LAB — GLUCOSE, CAPILLARY
GLUCOSE-CAPILLARY: 114 mg/dL — AB (ref 65–99)
GLUCOSE-CAPILLARY: 212 mg/dL — AB (ref 65–99)
GLUCOSE-CAPILLARY: 156 mg/dL — AB (ref 65–99)
GLUCOSE-CAPILLARY: 120 mg/dL — AB (ref 65–99)
Glucose-Capillary: 121 mg/dL — ABNORMAL HIGH (ref 65–99)
Glucose-Capillary: 252 mg/dL — ABNORMAL HIGH (ref 65–99)

## 2016-09-10 LAB — CBC
HEMATOCRIT: 28.5 % — AB (ref 36.0–46.0)
HEMOGLOBIN: 9.5 g/dL — AB (ref 12.0–15.0)
MCH: 27.6 pg (ref 26.0–34.0)
MCHC: 33.3 g/dL (ref 30.0–36.0)
MCV: 82.8 fL (ref 78.0–100.0)
Platelets: 176 10*3/uL (ref 150–400)
RBC: 3.44 MIL/uL — ABNORMAL LOW (ref 3.87–5.11)
RDW: 16.7 % — ABNORMAL HIGH (ref 11.5–15.5)
WBC: 7.2 10*3/uL (ref 4.0–10.5)

## 2016-09-10 LAB — COOXEMETRY PANEL
CARBOXYHEMOGLOBIN: 2.2 % — AB (ref 0.5–1.5)
Methemoglobin: 1.3 % (ref 0.0–1.5)
O2 SAT: 60.8 %
Total hemoglobin: 9.7 g/dL — ABNORMAL LOW (ref 12.0–16.0)

## 2016-09-10 MED ORDER — HEPARIN (PORCINE) IN NACL 100-0.45 UNIT/ML-% IJ SOLN
1050.0000 [IU]/h | INTRAMUSCULAR | Status: DC
  Administered 2016-09-11 – 2016-09-13 (×5): 1250 [IU]/h via INTRAVENOUS
  Administered 2016-09-15 – 2016-09-22 (×10): 1300 [IU]/h via INTRAVENOUS
  Administered 2016-09-23: 1050 [IU]/h via INTRAVENOUS
  Filled 2016-09-10 (×16): qty 250

## 2016-09-10 NOTE — Progress Notes (Signed)
Sent UA to lab this am still no result. Called Lab to confirm that they got the urine sample, they have no record of it. Will send UA again if/when patient voids.   Delories HeinzMelissa Wally Shevchenko, RN

## 2016-09-10 NOTE — Progress Notes (Signed)
Inpatient Diabetes Program Recommendations  AACE/ADA: New Consensus Statement on Inpatient Glycemic Control (2015)  Target Ranges:  Prepandial:   less than 140 mg/dL      Peak postprandial:   less than 180 mg/dL (1-2 hours)      Critically ill patients:  140 - 180 mg/dL   Lab Results  Component Value Date   GLUCAP 156 (H) 09/10/2016   HGBA1C 9.8 (H) 01/14/2015    Review of Glycemic Control:  Results for Jessica [name], Jessica [name] (MRN [id number]) as of 09/10/2016 10:14  Ref. Range 09/09/2016 11:18 09/09/2016 15:51 09/09/2016 21:29 09/10/2016 07:23  Glucose-Capillary Latest Ref Range: 65 - 99 mg/dL 782212 (H) 956192 (H) 213190 (H) 156 (H)   Diabetes history: DM 2 Outpatient Diabetes medications: Lantus 5 units daily  Current orders for Inpatient glycemic control:  Novolog moderate tid with meals, Lantus 8 units daily  Inpatient Diabetes Program Recommendations:   Please consider reducing Novolog to sensitive tid with meals and increase Lantus to 10 units daily.  Also consider adding Novolog meal coverage 2 units tid with meals-Hold if patient eats less than 50%.  Thanks, Beryl MeagerJenny Hailea Eaglin, RN, BC-ADM Inpatient Diabetes Coordinator Pager [phone] (8a-5p)

## 2016-09-10 NOTE — Progress Notes (Signed)
ANTICOAGULATION CONSULT NOTE - Follow Up Consult  Pharmacy Consult for Heparin Indication: atrial fibrillation  Allergies  Allergen Reactions  . Nyquil Multi-Symptom [Pseudoeph-Doxylamine-Dm-Apap] Other (See Comments)    Fatigue, chest pressure and pain Pt tolerates Coricidin cough syrup at home  . Sudafed [Pseudoephedrine] Other (See Comments)    Fatigue, chest pressure and pain  . Lasix [Furosemide]     Rash  . Torsemide     Rash  . Zocor [Simvastatin]     Myalgias   . Latex Rash  . Ramipril Cough    Patient Measurements: Height: 5\' 5"  (165.1 cm) Weight: 123 lb 3.2 oz (55.9 kg) IBW/kg (Calculated) : 57  Vital Signs: Temp: 98.2 F (36.8 C) (07/02 0700) Temp Source: Oral (07/02 0700) BP: 106/53 (07/02 0800) Pulse Rate: 53 (07/02 0800)  Labs:  Recent Labs  09/08/16 0400 09/08/16 1649 09/09/16 0402 09/10/16 0402  HGB 10.7*  --  10.3* 9.5*  HCT 30.8*  --  30.8* 28.5*  PLT 156  --  173 176  HEPARINUNFRC 0.65  --  0.51 0.32  CREATININE 2.58* 2.07* 1.98* 3.18*    Estimated Creatinine Clearance: 17 mL/min (A) (by C-G formula based on SCr of 3.18 mg/dL (H)).  Medications: Heparin @ 1200 units/hr  Assessment: 56yof on eliquis for afib, s/p DCCV on 6/18, transitioned to IV heparin 6/20 anticipating CVVHD. CVVHD stopped 6/27, temp IJ catheter placed 6/28, heparin resumed, and CVVHD resumed 6/29. CRRT stopped again 7/1.   Heparin level is therapeutic at 0.32 but trending down. CBC stable. No bleeding noted.  Goal of Therapy:  Heparin level 0.3-0.7 units/ml Monitor platelets by anticoagulation protocol: Yes   Plan:  1) Increase heparin to 1250 units/hr 2) Daily heparin level and CBC 3) Follow up plan for oral anticoagulation eventually.  Louie CasaJennifer Alecxis Baltzell, PharmD, BCPS  09/10/2016 9:16 AM

## 2016-09-10 NOTE — Progress Notes (Signed)
Patient ID: Jessica [name], female   DOB: 1960-06-16, 56 y.o.   MRN: [id number]   Advanced Heart Failure Rounding Note   Subjective:    Admitted with new onset atrial flutter and ADHF. Milrinone started 6/15 for worsening HF and renal failure.  S/P TEE and successful DC-CV.   Moved to ICU on 6/19 due to low output and worsening AKI. CVVHD catheter placed. CVVHD stopped 6/27. Resumed 6/29.  09/08/16 Norep increased to 8 due to low co-ox 48%.  Coox 60.8% this am on milrinone 0.375 mcg/kg/min and norepi 8 mcg. Creatinine 1.98 -> 3.18 with holding CVVHD yesterday.   Tmax 100.1. 325 cc of UO yesterday.  CVP up to 11-12 on my check.   Feeling OK this am.  Small amount of UO yesterday. Denies lightheadedness or dizziness. Not SOB at rest or orthopneic. Coughing. Feels "thick" but no production.   Objective:   Weight Range:  Vital Signs:   Temp:  [98.5 F (36.9 C)-100.1 F (37.8 C)] 98.5 F (36.9 C) (07/02 0300) Pulse Rate:  [54-70] 56 (07/02 0700) Resp:  [14-26] 15 (07/02 0700) BP: (84-115)/(48-67) 114/59 (07/02 0700) SpO2:  [87 %-100 %] 94 % (07/02 0700) Weight:  [123 lb 3.2 oz (55.9 kg)] 123 lb 3.2 oz (55.9 kg) (07/02 0300) Last BM Date: 09/09/16  Weight change: Filed Weights   09/08/16 0500 09/09/16 0500 09/10/16 0300  Weight: 120 lb 4.8 oz (54.6 kg) 117 lb 11.2 oz (53.4 kg) 123 lb 3.2 oz (55.9 kg)    Intake/Output:   Intake/Output Summary (Last 24 hours) at 09/10/16 0719 Last data filed at 09/10/16 0300  Gross per 24 hour  Intake             1170 ml  Output              414 ml  Net              756 ml     Physical Exam: CVP 4  General: Lying in bed. NAD at rest. Chronically ill appearing.  HEENT: Normal Neck: Supple. JVP 5-6. Carotids 2+ bilat; no bruits. No thyromegaly or nodule noted. Cor: PMI lateral. RRR, +S3. Abrasion over ICD site.  Lungs: Diminished basilar sounds.  Abdomen: Soft, non-tender, non-distended, no HSM. No bruits or masses. +BS  Extremities:  No cyanosis, clubbing, rash, R and LLE no edema.  Neuro: Alert & orientedx3, cranial nerves grossly intact. moves all 4 extremities w/o difficulty. Affect pleasant     Telemetry: Personally reviewed, NSR 60s. Occasional PVCs.   Labs: Basic Metabolic Panel:  Recent Labs Lab 09/06/16 0435  09/07/16 0334  09/07/16 1552 09/08/16 0400 09/08/16 1649 09/09/16 0402 09/10/16 0402  NA 127*  < >  --   < > 123* 131* 130* 130* 124*  K 5.2*  < >  --   < > 4.5 4.8 4.5 4.4 4.4  CL 92*  < >  --   < > 93* 97* 98* 97* 92*  CO2 25  < >  --   < > 20* 26 26 26 25   GLUCOSE 229*  < >  --   < > 156* 131* 190* 216* 318*  BUN 29*  < >  --   < > 30* 21* 16 15 25*  CREATININE 3.24*  < >  --   < > 3.23* 2.58* 2.07* 1.98* 3.18*  CALCIUM 9.1  < >  --   < > 8.6* 8.9 8.9 8.8* 8.9  MG 2.6*  --  2.6*  --   --  2.7*  --  2.5* 2.2  PHOS 3.3  < >  --   < > 2.6 2.6 1.8* 2.0* 3.2  < > = values in this interval not displayed.  Liver Function Tests:  Recent Labs Lab 09/07/16 1552 09/08/16 0400 09/08/16 1649 09/09/16 0402 09/10/16 0402  ALBUMIN 3.0* 3.0* 3.1* 2.9* 2.8*   No results for input(s): LIPASE, AMYLASE in the last 168 hours. No results for input(s): AMMONIA in the last 168 hours.  CBC:  Recent Labs Lab 09/06/16 0435 09/07/16 0334 09/08/16 0400 09/09/16 0402 09/10/16 0402  WBC 5.9 5.7 5.4 6.7 7.2  HGB 10.8* 10.0* 10.7* 10.3* 9.5*  HCT 31.7* 29.7* 30.8* 30.8* 28.5*  MCV 82.1 82.3 81.3 83.2 82.8  PLT 155 145* 156 173 176    Cardiac Enzymes: No results for input(s): CKTOTAL, CKMB, CKMBINDEX, TROPONINI in the last 168 hours.  BNP: BNP (last 3 results)  Recent Labs  09/28/15 1114  BNP 871.3*    ProBNP (last 3 results) No results for input(s): PROBNP in the last 8760 hours.    Other results:  Imaging: No results found.   Medications:     Scheduled Medications: . amiodarone  200 mg Oral Daily  . aspirin EC  81 mg Oral Daily  . Chlorhexidine Gluconate Cloth  6 each  Topical Q0600  . insulin aspart  0-15 Units Subcutaneous TID WC  . insulin glargine  8 Units Subcutaneous Daily  . pravastatin  20 mg Oral q1800  . sildenafil  20 mg Oral TID  . sodium chloride flush  10-40 mL Intracatheter Q12H    Infusions: . heparin 1,200 Units/hr (09/10/16 0300)  . milrinone 0.375 mcg/kg/min (09/10/16 0300)  . norepinephrine (LEVOPHED) Adult infusion 8 mcg/min (09/10/16 0300)  . sodium chloride      PRN Medications: acetaminophen, albuterol, benzonatate, chlorpheniramine-HYDROcodone, docusate sodium, heparin, hydrOXYzine, mineral oil-hydrophilic petrolatum, ondansetron (ZOFRAN) IV, sodium chloride, sodium chloride flush   Patient Profile:   Jessica [name] is a 56 year old with h/o chronic systolic heart failure, ICM, Biotronik ICD, and PAH admitted new onset AFL and cardiogenic shock (EF 10%). Developed AKI requiring CVVHD.  Assessment/Plan/Discussion:    1. A/C Systolic Biventricular Heart Failure -> cardiogenic shock:  - Ischemic cardiomyopathy.  Echo 08/24/16. LVEF 10% severe RV HK (EF down further in setting of AFL).   - Remains on milrinone 0.375 mcg/kg/min and Norepi 8 mcg. Coox 60.8% this am.  - Volume status rising rapidly. CVP 11-12 on my check this am.  Holding CVVHD until CVP climbs to 15-20 range..   - No bb or ACE/ARB/ARNI with low output and AKI .   2. A flutter RVR:  - New onset June 11 with device interrogation. S/P TEE DC-CV 6/18 with conversion to NSR.  - Maintaining SR. Amio turned down to 200 daily several days ago. HR now improved in 60s. Continue heparin drip. (Was on Eliquis at home) Hgb 10.3 -> 9.5.     - Discussed with Pharm-D.   3. Acute on chronic renal failure (baseline CKD 3) due to cardio-renal syndrome.  -- CVVHD started 6/20. Restarted 6/29. Held 09/09/16 with low volume status.  Decision made in conjunction with Dr. Lowell GuitarPowell to hold until CVP climbs back to 15-20 range.  - No candidate for iHD due to severe HF.  - Poor urine output  overall despite high dose lasix.   4. PAH - Continue sildenafil 20 mg TID for now. No change.  .[name]  5. CAD S/P multiple interventions.  - No ss/ ischemia.  Continue 81 mg aspirin.   - She was unable to take simvastatin due to myalgias. Continue pravastatin.   - No change.   6. PAD: occluded left SFA with mild/mod abnormal ABIs. No revascularization was needed October 2017. No change.   7. H/O LV Thrombus: no thrombus on TEE. Remains on heparin for AFL. No change.   8. Hyponatremia:  -- Serum sodium 124 today. Free water restrict.   9. Deconditioning - PT consulted.    10. ID - Tmax 100.1 overnight.  - Had 325 cc of UO yesterday. Will try and check UA.  - Check CXR with cough. Lungs sound OK.  - Will check BCx.  Graciella Freer, PA-C  09/10/2016 7:19 AM  Advanced Heart Failure Team Pager [phone] (M-F; 7a - 4p)  Please contact [facility] for night-coverage after hours (4p -7a ) and weekends on [email or web address]  Agree with above.   Remains in shock. Requiring dual pressors. Off CVVHD for the weekend. Creatinine nearly doubled. Made ~300cc urine. CVP 12. Low grade temp  On exam Fatigued appearing Remains in NSR Cor RRR +s3 Lungs minimal crackles Ab soft NT Ext warm no edema  Remains very tenuous. Will hold CVVHD and diuresis today. Restart tomorrow. Continue inotrope support. Check cultures. We will have to decide how long we will give her kidneys to try and recover because only other option is Hospice.   CRITICAL CARE Performed by: Arvilla Meres  Total critical care time: 35 minutes  Critical care time was exclusive of separately billable procedures and treating other patients.  Critical care was necessary to treat or prevent imminent or life-threatening deterioration.  Critical care was time spent personally by me (independent of midlevel providers or residents) on the following activities: development of treatment plan with patient and/or surrogate as well  as nursing, discussions with consultants, evaluation of patient's response to treatment, examination of patient, obtaining history from patient or surrogate, ordering and performing treatments and interventions, ordering and review of laboratory studies, ordering and review of radiographic studies, pulse oximetry and re-evaluation of patient's condition.   Arvilla Meres, MD  8:11 AM

## 2016-09-10 NOTE — Plan of Care (Signed)
Problem: Activity: Goal: Activity intolerance will improve Outcome: Progressing Pt up walking the halls  Problem: Fluid Volume: Goal: Fluid volume balance will be maintained or improved Outcome: Progressing CVP 8-14  Problem: Cardiac: Goal: Cardiac symptoms related to disease process will be avoided Outcome: Progressing Currently within parameters

## 2016-09-10 NOTE — Progress Notes (Signed)
Patient ID: Jessica [name], female   DOB: June 08, 1960, 56 y.o.   MRN: [id number] S:No complaints O:BP (!) 96/47   Pulse (!) 53   Temp 98.2 F (36.8 C) (Oral)   Resp 15   Ht 5\' 5"  (1.651 m)   Wt 55.9 kg (123 lb 3.2 oz)   SpO2 98%   BMI 20.50 kg/m   Intake/Output Summary (Last 24 hours) at 09/10/16 1023 Last data filed at 09/10/16 0900  Gross per 24 hour  Intake           1116.5 ml  Output              625 ml  Net            491.5 ml   Intake/Output: I/O last 3 completed shifts: In: 1586 [P.O.:650; I.V.:936] Out: 2506 [Urine:775; Other:1731]  Intake/Output this shift:  Total I/O In: 280.5 [P.O.:240; I.V.:40.5] Out: 150 [Urine:150] Weight change: 2.495 kg (5 lb 8 oz) WUJ:[id number] ill-appearing AAF CVS:RRR no rub Resp:decreased BS at bases AOZ:HYQMVHQIO, soft, +BS Ext:tr edema   Recent Labs Lab 09/06/16 1620 09/07/16 0336 09/07/16 1552 09/08/16 0400 09/08/16 1649 09/09/16 0402 09/10/16 0402  NA 123* 120* 123* 131* 130* 130* 124*  K 4.5 4.9 4.5 4.8 4.5 4.4 4.4  CL 91* 88* 93* 97* 98* 97* 92*  CO2 22 23 20* 26 26 26 25   GLUCOSE 205* 257* 156* 131* 190* 216* 318*  BUN 33* 40* 30* 21* 16 15 25*  CREATININE 3.80* 4.44* 3.23* 2.58* 2.07* 1.98* 3.18*  ALBUMIN 3.0* 2.9* 3.0* 3.0* 3.1* 2.9* 2.8*  CALCIUM 9.0 8.8* 8.6* 8.9 8.9 8.8* 8.9  PHOS 2.8 3.5 2.6 2.6 1.8* 2.0* 3.2   Liver Function Tests:  Recent Labs Lab 09/08/16 1649 09/09/16 0402 09/10/16 0402  ALBUMIN 3.1* 2.9* 2.8*   No results for input(s): LIPASE, AMYLASE in the last 168 hours. No results for input(s): AMMONIA in the last 168 hours. CBC:  Recent Labs Lab 09/06/16 0435 09/07/16 0334 09/08/16 0400 09/09/16 0402 09/10/16 0402  WBC 5.9 5.7 5.4 6.7 7.2  HGB 10.8* 10.0* 10.7* 10.3* 9.5*  HCT 31.7* 29.7* 30.8* 30.8* 28.5*  MCV 82.1 82.3 81.3 83.2 82.8  PLT 155 145* 156 173 176   Cardiac Enzymes: No results for input(s): CKTOTAL, CKMB, CKMBINDEX, TROPONINI in the last 168  hours. CBG:  Recent Labs Lab 09/09/16 0714 09/09/16 1118 09/09/16 1551 09/09/16 2129 09/10/16 0723  GLUCAP 114* 212* 192* 190* 156*    Iron Studies: No results for input(s): IRON, TIBC, TRANSFERRIN, FERRITIN in the last 72 hours. Studies/Results: Dg Chest Port 1 View  Result Date: 09/10/2016 CLINICAL DATA:  Fever and cough EXAM: PORTABLE CHEST 1 VIEW COMPARISON:  08/24/2016 FINDINGS: Cardiac shadow is enlarged. Defibrillator is again seen and stable. Right-sided PICC line is noted in satisfactory position. New right dialysis catheter is noted as well. The lungs are well aerated bilaterally. Mild vascular congestion remains and stable from the prior exam. No focal confluent infiltrate is seen. IMPRESSION: Persistent mild vascular congestion. Electronically Signed   By: Alcide Clever M.D.   On: 09/10/2016 08:35   . amiodarone  200 mg Oral Daily  . aspirin EC  81 mg Oral Daily  . Chlorhexidine Gluconate Cloth  6 each Topical Q0600  . insulin aspart  0-15 Units Subcutaneous TID WC  . insulin glargine  8 Units Subcutaneous Daily  . pravastatin  20 mg Oral q1800  . sildenafil  20 mg Oral TID  . sodium  chloride flush  10-40 mL Intracatheter Q12H    BMET    Component Value Date/Time   NA 124 (L) 09/10/2016 0402   NA 137 07/12/2016 1328   K 4.4 09/10/2016 0402   CL 92 (L) 09/10/2016 0402   CO2 25 09/10/2016 0402   GLUCOSE 318 (H) 09/10/2016 0402   BUN 25 (H) 09/10/2016 0402   BUN 32 (H) 07/12/2016 1328   CREATININE 3.18 (H) 09/10/2016 0402   CREATININE 0.91 08/25/2010 1517   CALCIUM 8.9 09/10/2016 0402   GFRNONAA 15 (L) 09/10/2016 0402   GFRNONAA >60 08/25/2010 1517   GFRAA 17 (L) 09/10/2016 0402   GFRAA >60 08/25/2010 1517   CBC    Component Value Date/Time   WBC 7.2 09/10/2016 0402   RBC 3.44 (L) 09/10/2016 0402   HGB 9.5 (L) 09/10/2016 0402   HGB 13.9 07/12/2016 1328   HCT 28.5 (L) 09/10/2016 0402   HCT 39.9 07/12/2016 1328   PLT 176 09/10/2016 0402   PLT 159  07/12/2016 1328   MCV 82.8 09/10/2016 0402   MCV 83 07/12/2016 1328   MCH 27.6 09/10/2016 0402   MCHC 33.3 09/10/2016 0402   RDW 16.7 (H) 09/10/2016 0402   RDW 16.8 (H) 07/12/2016 1328   LYMPHSABS 1.0 08/29/2016 0435   MONOABS 0.7 08/29/2016 0435   EOSABS 0.1 08/29/2016 0435   BASOSABS 0.0 08/29/2016 0435     Assessment/Plan:  1. AKI/CKD stage 3 due to cardiorenal syndrome, oliguric.  Started on CVVHD 6/20 and then has been off again on again, most recently from 6/29-09/09/16 with low cvp (has pulmonary HTN and needs higher filling pressures with goal cvp of 15-20).  Holding CVVHD for now with marked increase in BUN/Cr.  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 2. Acute on chronic biventricular heart failure/cardiogenic shock- on milrinone/norepi.  Holding CVVHD for now until CVP reaches 15-20 (was 11-12 this am) 3. A flutter with rvr- s/p DC-CV, on amio 4. Pulmonary HTN- on sildenafil 20mg  tid 5. Hyponatremia- due to chf 6. LV thrombus- on eliquis and heparin  Irena CordsJoseph A. Loni Delbridge, MD [facility] [phone]

## 2016-09-10 NOTE — Progress Notes (Signed)
SATURATION QUALIFICATIONS: (This note is used to comply with regulatory documentation for home oxygen)  Patient Saturations on Room Air at Rest = 85-88%  Patient Saturations on Room Air while Ambulating = low to mid 80's%  Patient Saturations on 2 Liters of oxygen while Ambulating = 87-94%  Please briefly explain why patient needs home oxygen:Pt needed 2LO2 to keep sats >87%.  Also needs O2 at rest.  Willprobably need home O2.  Thanks.  Luc[facility] Sal[facility] Pack[facility] Children[facility] Hosp. At StanfordDawn Mayo Owczarzak,PT Acute Rehabilitation [phone] (pager)

## 2016-09-10 NOTE — Progress Notes (Addendum)
Physical Therapy Treatment Patient Details Name: Jessica [name] MRN: [id number] DOB: 1960/07/26 Today's Date: 09/10/2016    History of Present Illness 56yof on eliquis for afib, s/p DCCV on 6/18, transitioned to IV heparin 6/20 anticipating CVVHD due to Acute oliguric kidney injury: secondary to decompensated HF.  CVVHD stopped 09/05/16.       PT Comments    Pt admitted with above diagnosis. Pt currently with functional limitations due to the deficits listed below (see PT Problem List). Pt was able to ambulate around large part of unit with min guard assist with RW.  Pt without any LOB with steady gait overall.  Son present and walked the unit with pt and myself.  Pt progressing well.  She still feels like she needs to [facility] for safety.   Pt will benefit from skilled PT to increase their independence and safety with mobility to allow discharge to the venue listed below.    SATURATION QUALIFICATIONS: (This note is used to comply with regulatory documentation for home oxygen)  Patient Saturations on Room Air at Rest = 85-88%  Patient Saturations on Room Air while Ambulating = low to mid 80's%  Patient Saturations on 2 Liters of oxygen while Ambulating = 87-94%  Please briefly explain why patient needs home oxygen:Pt needed 2LO2 to keep sats >87%.  Also needs O2 at rest.  Willprobably need home O2.   Follow Up Recommendations  Home health PT;Supervision/Assistance - 24 hour     Equipment Recommendations  None recommended by PT    Recommendations for Other Services       Precautions / Restrictions Precautions Precautions: Fall Restrictions Weight Bearing Restrictions: No    Mobility  Bed Mobility                  Transfers Overall transfer level: Needs assistance Equipment used: Rolling walker (2 wheeled) Transfers: Sit to/from Stand Sit to Stand: Min guard         General transfer comment: No assist needed to stand.  Cues given for hand placement.    Ambulation/Gait Ambulation/Gait assistance: Min guard Ambulation Distance (Feet): 360 Feet Assistive device: Rolling walker (2 wheeled) Gait Pattern/deviations: Step-through pattern;Decreased stride length   Gait velocity interpretation: <1.8 ft/sec, indicative of risk for recurrent falls General Gait Details: Pt was able to ambulate with RW with cues for sequencing steps and RW.  Pt was able to ambulate with steadying assist only at times.  Able to steer RW well.    Stairs            Wheelchair Mobility    Modified Rankin (Stroke Patients Only)       Balance Overall balance assessment: Needs assistance Sitting-balance support: No upper extremity supported;Feet supported Sitting balance-Leahy Scale: Good     Standing balance support: Bilateral upper extremity supported;During functional activity Standing balance-Leahy Scale: Fair Standing balance comment: can stand statically without UE support.                High Level Balance Comments: min guard assist and cues with RW            Cognition Arousal/Alertness: Awake/alert Behavior During Therapy: WFL for tasks assessed/performed Overall Cognitive Status: Within Functional Limits for tasks assessed                                        Exercises      General Comments  Pertinent Vitals/Pain Pain Assessment: No/denies pain    Home Living                      Prior Function            PT Goals (current goals can now be found in the care plan section) Progress towards PT goals: Progressing toward goals    Frequency    Min 3X/week      PT Plan Current plan remains appropriate    Co-evaluation              AM-PAC PT "6 Clicks" Daily Activity  Outcome Measure  Difficulty turning over in bed (including adjusting bedclothes, sheets and blankets)?: None Difficulty moving from lying on back to sitting on the side of the bed? : None Difficulty  sitting down on and standing up from a chair with arms (e.g., wheelchair, bedside commode, etc,.)?: A Little Help needed moving to and from a bed to chair (including a wheelchair)?: A Little Help needed walking in hospital room?: A Little Help needed climbing 3-5 steps with a railing? : A Little 6 Click Score: 20    End of Session Equipment Utilized During Treatment: Gait belt;Oxygen Activity Tolerance: Patient limited by fatigue Patient left: with call bell/phone within reach;in chair;with family/visitor present Nurse Communication: Mobility status PT Visit Diagnosis: Unsteadiness on feet (R26.81);Muscle weakness (generalized) (M62.81)     Time: 1039-1100 PT Time Calculation (min) (ACUTE ONLY): 21 min  Charges:  $Gait Training: 8-22 mins                    G Codes:       Anysia Choi,PT Acute Rehabilitation [phone] (pager)    Berline Lopesawn F Diem Dicocco 09/10/2016, 12:00 PM

## 2016-09-10 NOTE — Plan of Care (Signed)
Problem: Activity: Goal: Activity intolerance will improve Outcome: Progressing PT worked with PT 09/10/16  Problem: Fluid Volume: Goal: Fluid volume balance will be maintained or improved Outcome: Progressing CVP climbing. 8-12  Problem: Cardiac: Goal: Cardiac symptoms related to disease process will be avoided Outcome: Progressing Currently within parameters on current gtt's

## 2016-09-11 LAB — GLUCOSE, CAPILLARY
GLUCOSE-CAPILLARY: 336 mg/dL — AB (ref 65–99)
GLUCOSE-CAPILLARY: 208 mg/dL — AB (ref 65–99)
GLUCOSE-CAPILLARY: 270 mg/dL — AB (ref 65–99)
Glucose-Capillary: 69 mg/dL (ref 65–99)
Glucose-Capillary: 208 mg/dL — ABNORMAL HIGH (ref 65–99)

## 2016-09-11 LAB — RENAL FUNCTION PANEL
ALBUMIN: 2.9 g/dL — AB (ref 3.5–5.0)
ANION GAP: 10 (ref 5–15)
ANION GAP: 11 (ref 5–15)
Albumin: 2.8 g/dL — ABNORMAL LOW (ref 3.5–5.0)
Albumin: 2.9 g/dL — ABNORMAL LOW (ref 3.5–5.0)
Anion gap: 5 (ref 5–15)
BUN: 36 mg/dL — ABNORMAL HIGH (ref 6–20)
BUN: 40 mg/dL — ABNORMAL HIGH (ref 6–20)
BUN: 46 mg/dL — ABNORMAL HIGH (ref 6–20)
CHLORIDE: 91 mmol/L — AB (ref 101–111)
CO2: 20 mmol/L — AB (ref 22–32)
CO2: 20 mmol/L — AB (ref 22–32)
CO2: 26 mmol/L (ref 22–32)
CREATININE: 4.06 mg/dL — AB (ref 0.44–1.00)
Calcium: 8.8 mg/dL — ABNORMAL LOW (ref 8.9–10.3)
Calcium: 8.8 mg/dL — ABNORMAL LOW (ref 8.9–10.3)
Calcium: 9.1 mg/dL (ref 8.9–10.3)
Chloride: 89 mmol/L — ABNORMAL LOW (ref 101–111)
Chloride: 90 mmol/L — ABNORMAL LOW (ref 101–111)
Creatinine, Ser: 4.13 mg/dL — ABNORMAL HIGH (ref 0.44–1.00)
Creatinine, Ser: 4.34 mg/dL — ABNORMAL HIGH (ref 0.44–1.00)
GFR calc Af Amer: 12 mL/min — ABNORMAL LOW (ref >60)
GFR calc non Af Amer: 11 mL/min — ABNORMAL LOW (ref >60)
GFR calc non Af Amer: 10 mL/min — ABNORMAL LOW (ref >60)
GFR, EST AFRICAN AMERICAN: 13 mL/min — AB (ref >60)
GFR, EST AFRICAN AMERICAN: 13 mL/min — AB (ref >60)
GFR, EST NON AFRICAN AMERICAN: 11 mL/min — AB (ref >60)
GLUCOSE: 303 mg/dL — AB (ref 65–99)
Glucose, Bld: 271 mg/dL — ABNORMAL HIGH (ref 65–99)
Glucose, Bld: 325 mg/dL — ABNORMAL HIGH (ref 65–99)
PHOSPHORUS: 4 mg/dL (ref 2.5–4.6)
PHOSPHORUS: 4.6 mg/dL (ref 2.5–4.6)
POTASSIUM: 5 mmol/L (ref 3.5–5.1)
POTASSIUM: 5.1 mmol/L (ref 3.5–5.1)
Phosphorus: 4.3 mg/dL (ref 2.5–4.6)
Potassium: 4.9 mmol/L (ref 3.5–5.1)
Sodium: 120 mmol/L — ABNORMAL LOW (ref 135–145)
Sodium: 121 mmol/L — ABNORMAL LOW (ref 135–145)
Sodium: 121 mmol/L — ABNORMAL LOW (ref 135–145)

## 2016-09-11 LAB — CBC
HCT: 29 % — ABNORMAL LOW (ref 36.0–46.0)
HEMOGLOBIN: 9.6 g/dL — AB (ref 12.0–15.0)
MCH: 27.4 pg (ref 26.0–34.0)
MCHC: 33.1 g/dL (ref 30.0–36.0)
MCV: 82.9 fL (ref 78.0–100.0)
PLATELETS: 177 10*3/uL (ref 150–400)
RBC: 3.5 MIL/uL — AB (ref 3.87–5.11)
RDW: 16.8 % — ABNORMAL HIGH (ref 11.5–15.5)
WBC: 6.9 10*3/uL (ref 4.0–10.5)

## 2016-09-11 LAB — COOXEMETRY PANEL
CARBOXYHEMOGLOBIN: 1.7 % — AB (ref 0.5–1.5)
METHEMOGLOBIN: 1.4 % (ref 0.0–1.5)
O2 SAT: 57.9 %
Total hemoglobin: 9.7 g/dL — ABNORMAL LOW (ref 12.0–16.0)

## 2016-09-11 LAB — MAGNESIUM: Magnesium: 2.2 mg/dL (ref 1.7–2.4)

## 2016-09-11 LAB — HEPARIN LEVEL (UNFRACTIONATED): HEPARIN UNFRACTIONATED: 0.53 [IU]/mL (ref 0.30–0.70)

## 2016-09-11 MED ORDER — INSULIN ASPART 100 UNIT/ML ~~LOC~~ SOLN
0.0000 [IU] | Freq: Three times a day (TID) | SUBCUTANEOUS | Status: DC
  Administered 2016-09-11: 3 [IU] via SUBCUTANEOUS
  Administered 2016-09-11: 5 [IU] via SUBCUTANEOUS
  Administered 2016-09-12: 3 [IU] via SUBCUTANEOUS
  Administered 2016-09-12: 1 [IU] via SUBCUTANEOUS
  Administered 2016-09-13: 2 [IU] via SUBCUTANEOUS
  Administered 2016-09-13: 3 [IU] via SUBCUTANEOUS
  Administered 2016-09-13 – 2016-09-14 (×4): 2 [IU] via SUBCUTANEOUS
  Administered 2016-09-15: 1 [IU] via SUBCUTANEOUS
  Administered 2016-09-15: 2 [IU] via SUBCUTANEOUS
  Administered 2016-09-15 – 2016-09-16 (×2): 1 [IU] via SUBCUTANEOUS
  Administered 2016-09-16: 2 [IU] via SUBCUTANEOUS
  Administered 2016-09-16 – 2016-09-17 (×3): 1 [IU] via SUBCUTANEOUS
  Administered 2016-09-17: 2 [IU] via SUBCUTANEOUS
  Administered 2016-09-18 (×2): 1 [IU] via SUBCUTANEOUS
  Administered 2016-09-18 – 2016-09-19 (×2): 2 [IU] via SUBCUTANEOUS
  Administered 2016-09-19 (×2): 1 [IU] via SUBCUTANEOUS
  Administered 2016-09-20: 2 [IU] via SUBCUTANEOUS
  Administered 2016-09-20: 1 [IU] via SUBCUTANEOUS
  Administered 2016-09-20 – 2016-09-21 (×3): 2 [IU] via SUBCUTANEOUS
  Administered 2016-09-26: 1 [IU] via SUBCUTANEOUS
  Administered 2016-09-26 – 2016-09-27 (×4): 2 [IU] via SUBCUTANEOUS
  Administered 2016-09-28: 1 [IU] via SUBCUTANEOUS
  Administered 2016-09-28 – 2016-09-29 (×2): 2 [IU] via SUBCUTANEOUS
  Administered 2016-09-29 – 2016-09-30 (×2): 1 [IU] via SUBCUTANEOUS
  Administered 2016-10-01: 3 [IU] via SUBCUTANEOUS

## 2016-09-11 MED ORDER — FUROSEMIDE 10 MG/ML IJ SOLN
160.0000 mg | Freq: Once | INTRAVENOUS | Status: AC
  Administered 2016-09-11: 160 mg via INTRAVENOUS
  Filled 2016-09-11: qty 16

## 2016-09-11 MED ORDER — METOLAZONE 5 MG PO TABS
5.0000 mg | ORAL_TABLET | Freq: Once | ORAL | Status: AC
Start: 2016-09-11 — End: 2016-09-11
  Administered 2016-09-11: 5 mg via ORAL
  Filled 2016-09-11: qty 1

## 2016-09-11 MED ORDER — DEXTROSE 5 % IV SOLN
1.0000 g | INTRAVENOUS | Status: AC
  Administered 2016-09-11 – 2016-09-15 (×5): 1 g via INTRAVENOUS
  Filled 2016-09-11 (×5): qty 10

## 2016-09-11 MED ORDER — INSULIN ASPART 100 UNIT/ML ~~LOC~~ SOLN
2.0000 [IU] | Freq: Three times a day (TID) | SUBCUTANEOUS | Status: DC
  Administered 2016-09-11 – 2016-10-01 (×32): 2 [IU] via SUBCUTANEOUS

## 2016-09-11 MED ORDER — INSULIN GLARGINE 100 UNIT/ML ~~LOC~~ SOLN
10.0000 [IU] | Freq: Every day | SUBCUTANEOUS | Status: DC
  Administered 2016-09-11 – 2016-10-01 (×21): 10 [IU] via SUBCUTANEOUS
  Filled 2016-09-11 (×22): qty 0.1

## 2016-09-11 MED ORDER — HYDROCOD POLST-CPM POLST ER 10-8 MG/5ML PO SUER
5.0000 mL | Freq: Two times a day (BID) | ORAL | Status: DC | PRN
  Administered 2016-09-12 – 2016-09-25 (×7): 5 mL via ORAL
  Filled 2016-09-11 (×8): qty 5

## 2016-09-11 NOTE — Progress Notes (Signed)
ANTICOAGULATION CONSULT NOTE - Follow Up Consult  Pharmacy Consult for Heparin Indication: atrial fibrillation  Allergies  Allergen Reactions  . Nyquil Multi-Symptom [Pseudoeph-Doxylamine-Dm-Apap] Other (See Comments)    Fatigue, chest pressure and pain Pt tolerates Coricidin cough syrup at home  . Sudafed [Pseudoephedrine] Other (See Comments)    Fatigue, chest pressure and pain  . Lasix [Furosemide]     Rash  . Torsemide     Rash  . Zocor [Simvastatin]     Myalgias   . Latex Rash  . Ramipril Cough    Patient Measurements: Height: 5\' 5"  (165.1 cm) Weight: 128 lb 8.5 oz (58.3 kg) IBW/kg (Calculated) : 57  Vital Signs: Temp: 99 F (37.2 C) (07/03 0700) Temp Source: Oral (07/03 0700) BP: 101/57 (07/03 0800) Pulse Rate: 48 (07/03 0800)  Labs:  Recent Labs  09/09/16 0402 09/10/16 0402 09/10/16 1607 09/10/16 2355 09/11/16 0341 09/11/16 0730  HGB 10.3* 9.5*  --   --  9.6*  --   HCT 30.8* 28.5*  --   --  29.0*  --   PLT 173 176  --   --  177  --   HEPARINUNFRC 0.51 0.32  --   --   --  0.53  CREATININE 1.98* 3.18* 3.47* 4.06* 4.13*  --     Estimated Creatinine Clearance: 13.4 mL/min (A) (by C-G formula based on SCr of 4.13 mg/dL (H)).  Medications: Heparin @ 1250 units/hr  Assessment: 56yof on eliquis for afib, s/p DCCV on 6/18, transitioned to IV heparin 6/20 anticipating CVVHD. CVVHD stopped 6/27, temp IJ catheter placed 6/28, heparin resumed, and CVVHD resumed 6/29. CRRT stopped again 7/1.   Heparin level is therapeutic at 0.53. CBC stable. No bleeding noted.  Goal of Therapy:  Heparin level 0.3-0.7 units/ml Monitor platelets by anticoagulation protocol: Yes   Plan:  1) Continue heparin at 1250 units/hr 2) Daily heparin level and CBC 3) Follow up plan for oral anticoagulation eventually.  Louie CasaJennifer Demaryius Imran, PharmD, BCPS  09/11/2016 8:44 AM

## 2016-09-11 NOTE — Progress Notes (Signed)
Pt noted to be in junctional rhythm. MD made aware. Pt currently asymptomatic. Renal panel drew. No gross abnormality compared to previous. Will continue to monitor closely. Modena JanskyKevin Mickaela Starlin RN 2 Heart

## 2016-09-11 NOTE — Progress Notes (Addendum)
Patient ID: Terrika Zuver, female   DOB: 08-Feb-1961, 56 y.o.   MRN: [id number]   Advanced Heart Failure Rounding Note   Subjective:    Admitted with new onset atrial flutter and ADHF. Milrinone started 6/15 for worsening HF and renal failure.  S/P TEE and successful DC-CV.   Moved to ICU on 6/19 due to low output and worsening AKI. CVVHD catheter placed. CVVHD stopped 6/27. Resumed 6/29.  09/08/16 Norep increased to 8 due to low co-ox 48%.  Coox 57.9% this am on 0.375 mcg/kg/min and norep 8 mcg. Creatinine 1.98 -> 3.18 -> 4.13   Tmax 99.2. 500 cc of UO yesterday. CVP up to 15.   Cultures drawn 09/10/16 for low grade fever.   UA positive. WBC 6.9 this am. BCx pending this am.   Feeling OK this am. Tired. Denies SOB or CP. Frustrated that she isn't getting "fluid pills" to "make her kidneys work".   Objective:   Weight Range:  Vital Signs:   Temp:  [98.1 F (36.7 C)-99.2 F (37.3 C)] 98.8 F (37.1 C) (07/03 0415) Pulse Rate:  [35-123] 48 (07/03 0700) Resp:  [13-26] 17 (07/03 0700) BP: (86-109)/(47-63) 92/49 (07/03 0630) SpO2:  [74 %-100 %] 97 % (07/03 0700) Weight:  [128 lb 8.5 oz (58.3 kg)] 128 lb 8.5 oz (58.3 kg) (07/03 0400) Last BM Date: 09/10/16  Weight change: Filed Weights   09/09/16 0500 09/10/16 0300 09/11/16 0400  Weight: 117 lb 11.2 oz (53.4 kg) 123 lb 3.2 oz (55.9 kg) 128 lb 8.5 oz (58.3 kg)    Intake/Output:   Intake/Output Summary (Last 24 hours) at 09/11/16 0716 Last data filed at 09/11/16 0700  Gross per 24 hour  Intake            863.5 ml  Output              500 ml  Net            363.5 ml     Physical Exam: CVP 15  General: Chronically ill appearing. No resp difficulty. HEENT: Normal Neck: Supple. JVP to jaw. Carotids 2+ bilat; no bruits. No thyromegaly or nodule noted. Cor: PMI nondisplaced. RRR, +S3, Abrasion over ICD site healing.  Lungs: Diminished basilar sounds. Abdomen: Soft, non-tender, non-distended, no HSM. No bruits or masses.  +BS  Extremities: No cyanosis, clubbing, rash, R and LLE no edema.  Neuro: Alert & orientedx3, cranial nerves grossly intact. moves all 4 extremities w/o difficulty. Affect pleasant    Telemetry: Personally reviewed, Sinus brady vs Junctional rhythm in 50s.  Labs: Basic Metabolic Panel:  Recent Labs Lab 09/07/16 0334  09/08/16 0400  09/09/16 0402 09/10/16 0402 09/10/16 1607 09/10/16 2355 09/11/16 0341  NA  --   < > 131*  < > 130* 124* 124* 121* 121*  K  --   < > 4.8  < > 4.4 4.4 4.7 4.9 5.0  CL  --   < > 97*  < > 97* 92* 93* 90* 91*  CO2  --   < > 26  < > 26 25 23 26  20*  GLUCOSE  --   < > 131*  < > 216* 318* 314* 325* 303*  BUN  --   < > 21*  < > 15 25* 31* 36* 40*  CREATININE  --   < > 2.58*  < > 1.98* 3.18* 3.47* 4.06* 4.13*  CALCIUM  --   < > 8.9  < > 8.8* 8.9 8.9 8.8* 8.8*  MG 2.6*  --  2.7*  --  2.5* 2.2  --   --  2.2  PHOS  --   < > 2.6  < > 2.0* 3.2 3.4 4.0 4.3  < > = values in this interval not displayed.  Liver Function Tests:  Recent Labs Lab 09/09/16 0402 09/10/16 0402 09/10/16 1607 09/10/16 2355 09/11/16 0341  ALBUMIN 2.9* 2.8* 2.8* 2.9* 2.8*   No results for input(s): LIPASE, AMYLASE in the last 168 hours. No results for input(s): AMMONIA in the last 168 hours.  CBC:  Recent Labs Lab 09/07/16 0334 09/08/16 0400 09/09/16 0402 09/10/16 0402 09/11/16 0341  WBC 5.7 5.4 6.7 7.2 6.9  HGB 10.0* 10.7* 10.3* 9.5* 9.6*  HCT 29.7* 30.8* 30.8* 28.5* 29.0*  MCV 82.3 81.3 83.2 82.8 82.9  PLT 145* 156 173 176 177    Cardiac Enzymes: No results for input(s): CKTOTAL, CKMB, CKMBINDEX, TROPONINI in the last 168 hours.  BNP: BNP (last 3 results)  Recent Labs  09/28/15 1114  BNP 871.3*    ProBNP (last 3 results) No results for input(s): PROBNP in the last 8760 hours.    Other results:  Imaging: Dg Chest Port 1 View  Result Date: 09/10/2016 CLINICAL DATA:  Fever and cough EXAM: PORTABLE CHEST 1 VIEW COMPARISON:  08/24/2016 FINDINGS: Cardiac  shadow is enlarged. Defibrillator is again seen and stable. Right-sided PICC line is noted in satisfactory position. New right dialysis catheter is noted as well. The lungs are well aerated bilaterally. Mild vascular congestion remains and stable from the prior exam. No focal confluent infiltrate is seen. IMPRESSION: Persistent mild vascular congestion. Electronically Signed   By: Alcide CleverMark  Lukens M.D.   On: 09/10/2016 08:35     Medications:     Scheduled Medications: . amiodarone  200 mg Oral Daily  . aspirin EC  81 mg Oral Daily  . Chlorhexidine Gluconate Cloth  6 each Topical Q0600  . insulin aspart  0-15 Units Subcutaneous TID WC  . insulin glargine  8 Units Subcutaneous Daily  . pravastatin  20 mg Oral q1800  . sildenafil  20 mg Oral TID  . sodium chloride flush  10-40 mL Intracatheter Q12H    Infusions: . heparin 1,250 Units/hr (09/11/16 0700)  . milrinone 0.375 mcg/kg/min (09/11/16 0700)  . norepinephrine (LEVOPHED) Adult infusion 8 mcg/min (09/11/16 0700)  . sodium chloride      PRN Medications: acetaminophen, albuterol, benzonatate, chlorpheniramine-HYDROcodone, docusate sodium, heparin, hydrOXYzine, mineral oil-hydrophilic petrolatum, ondansetron (ZOFRAN) IV, sodium chloride, sodium chloride flush   Patient Profile:   [name] is a 56 year old with h/o chronic systolic heart failure, ICM, Biotronik ICD, and PAH admitted new onset AFL and cardiogenic shock (EF 10%). Developed AKI requiring CVVHD.  Assessment/Plan/Discussion:    1. A/C Systolic Biventricular Heart Failure -> cardiogenic shock:  - Ischemic cardiomyopathy.  Echo 08/24/16. LVEF 10% severe RV HK (EF down further in setting of AFL).   - Remains on milrinone 0.375 mcg/kg/min and Norepi 8 mcg. Coox 57.9% this am. - CVP 15. Will discuss options moving forward with MD and Nephrology.  She is not candidate for iHD with end stage severe ICM. ? If CVVHD vs re-trying high dose lasix -> Will have to transition to hospice if  no improvement.  - No bb or ACE/ARB/ARNI with low output and AKI .   2. A flutter RVR:  - New onset June 11 with device interrogation. S/P TEE DC-CV 6/18 with conversion to NSR.  - Maintaining SR. Amio  turned down to 200 daily several days ago. HR now improved in 60s. Continue heparin drip. (Was on Eliquis at home)  - Hgb 10.3 -> 9.5 ->9.6 - Discussed with Pharm-D.   3. Acute on chronic renal failure (baseline CKD 3) due to cardio-renal syndrome.  -- CVVHD started 6/20. Restarted 6/29. Held 09/09/16 with low volume status.  Decision made in conjunction with Dr. Lowell Guitar to hold until CVP climbs back to 15-20 range.  - Creatinine continues to worsen. Will discuss with MD and Renal.  - Not candidate for iHD due to severe HF.   4. PAH - Continue sildenafil 20 mg TID for now. No change.   .  5. CAD S/P multiple interventions.  - No s/s ischemia.  Continue 81 mg aspirin.   - She was unable to take simvastatin due to myalgias. Continue pravastatin.   - No change.  6. PAD: occluded left SFA with mild/mod abnormal ABIs. No revascularization was needed October 2017. No change.   7. H/O LV Thrombus: no thrombus on TEE. Remains on heparin for AFL. No change.  8. Hyponatremia:  - Serum sodium 121. Free water restrict. 2/2 to volume overload.    9. Deconditioning - PT following. Recommending HHPT.     10. ID - Tmax 99.2. UA positive. BCx pending - Will start Rocephin for UTI. UCx ordered. - CXR 09/10/16 with persistent mild vascular congesiton.   Graciella Freer, PA-C  09/11/2016 7:16 AM  Advanced Heart Failure Team Pager [phone] (M-F; 7a - 4p)  Please contact [facility] for night-coverage after hours (4p -7a ) and weekends on [email or web address]   Patient seen and examined with the above-signed Advanced Practice Provider and/or Housestaff. I personally reviewed laboratory data, imaging studies and relevant notes. I independently examined the patient and formulated the important aspects  of the plan. I have edited the note to reflect any of my changes or salient points. I have personally discussed the plan with the patient and/or family.  Renal function continues to deteriorate. Co-ox improved but remains on dual inotropes. CVP back up to 15. Sodium downa nd K up. Discussed with Dr. Arrie Aran. Will hold CVVHD today. Give Lasix 160 IV and metolazone 5 and assess response. Will treat UTI with ceftriaxone. CBGs persistently high. Increasing insulin  Arvilla Meres, MD  8:32 AM

## 2016-09-11 NOTE — Progress Notes (Signed)
Patient ID: Jessica [name], female   DOB: 04/23/1960, 56 y.o.   MRN: [id number] S:No new complaints O:BP (!) 106/91   Pulse (!) 54   Temp 98.5 F (36.9 C) (Oral)   Resp (!) 21   Ht 5\' 5"  (1.651 m)   Wt 58.3 kg (128 lb 8.5 oz)   SpO2 95%   BMI 21.39 kg/m   Intake/Output Summary (Last 24 hours) at 09/11/16 1510 Last data filed at 09/11/16 1200  Gross per 24 hour  Intake            906.5 ml  Output              350 ml  Net            556.5 ml   Intake/Output: I/O last 3 completed shifts: In: 1525.5 [P.O.:590; I.V.:935.5] Out: 975 [Urine:975]  Intake/Output this shift:  Total I/O In: 132.5 [I.V.:132.5] Out: 150 [Urine:150] Weight change: 2.417 kg (5 lb 5.3 oz) Gen:NAD CVS:no rub Resp:decreased BS at bases WUJ:WJXBJY Ext: tr edema   Recent Labs Lab 09/08/16 0400 09/08/16 1649 09/09/16 0402 09/10/16 0402 09/10/16 1607 09/10/16 2355 09/11/16 0341  NA 131* 130* 130* 124* 124* 121* 121*  K 4.8 4.5 4.4 4.4 4.7 4.9 5.0  CL 97* 98* 97* 92* 93* 90* 91*  CO2 26 26 26 25 23 26  20*  GLUCOSE 131* 190* 216* 318* 314* 325* 303*  BUN 21* 16 15 25* 31* 36* 40*  CREATININE 2.58* 2.07* 1.98* 3.18* 3.47* 4.06* 4.13*  ALBUMIN 3.0* 3.1* 2.9* 2.8* 2.8* 2.9* 2.8*  CALCIUM 8.9 8.9 8.8* 8.9 8.9 8.8* 8.8*  PHOS 2.6 1.8* 2.0* 3.2 3.4 4.0 4.3   Liver Function Tests:  Recent Labs Lab 09/10/16 1607 09/10/16 2355 09/11/16 0341  ALBUMIN 2.8* 2.9* 2.8*   No results for input(s): LIPASE, AMYLASE in the last 168 hours. No results for input(s): AMMONIA in the last 168 hours. CBC:  Recent Labs Lab 09/07/16 0334 09/08/16 0400 09/09/16 0402 09/10/16 0402 09/11/16 0341  WBC 5.7 5.4 6.7 7.2 6.9  HGB 10.0* 10.7* 10.3* 9.5* 9.6*  HCT 29.7* 30.8* 30.8* 28.5* 29.0*  MCV 82.3 81.3 83.2 82.8 82.9  PLT 145* 156 173 176 177   Cardiac Enzymes: No results for input(s): CKTOTAL, CKMB, CKMBINDEX, TROPONINI in the last 168 hours. CBG:  Recent Labs Lab 09/10/16 1538 09/10/16 2037  09/11/16 0741 09/11/16 1134 09/11/16 1137  GLUCAP 252* 120* 336* 69 208*    Iron Studies: No results for input(s): IRON, TIBC, TRANSFERRIN, FERRITIN in the last 72 hours. Studies/Results: Dg Chest Port 1 View  Result Date: 09/10/2016 CLINICAL DATA:  Fever and cough EXAM: PORTABLE CHEST 1 VIEW COMPARISON:  08/24/2016 FINDINGS: Cardiac shadow is enlarged. Defibrillator is again seen and stable. Right-sided PICC line is noted in satisfactory position. New right dialysis catheter is noted as well. The lungs are well aerated bilaterally. Mild vascular congestion remains and stable from the prior exam. No focal confluent infiltrate is seen. IMPRESSION: Persistent mild vascular congestion. Electronically Signed   By: Alcide Clever M.D.   On: 09/10/2016 08:35   . amiodarone  200 mg Oral Daily  . aspirin EC  81 mg Oral Daily  . Chlorhexidine Gluconate Cloth  6 each Topical Q0600  . insulin aspart  0-9 Units Subcutaneous TID WC  . insulin aspart  2 Units Subcutaneous TID WC  . insulin glargine  10 Units Subcutaneous Daily  . pravastatin  20 mg Oral q1800  . sildenafil  20 mg Oral TID  . sodium chloride flush  10-40 mL Intracatheter Q12H    BMET    Component Value Date/Time   NA 121 (L) 09/11/2016 0341   NA 137 07/12/2016 1328   K 5.0 09/11/2016 0341   CL 91 (L) 09/11/2016 0341   CO2 20 (L) 09/11/2016 0341   GLUCOSE 303 (H) 09/11/2016 0341   BUN 40 (H) 09/11/2016 0341   BUN 32 (H) 07/12/2016 1328   CREATININE 4.13 (H) 09/11/2016 0341   CREATININE 0.91 08/25/2010 1517   CALCIUM 8.8 (L) 09/11/2016 0341   GFRNONAA 11 (L) 09/11/2016 0341   GFRNONAA >60 08/25/2010 1517   GFRAA 13 (L) 09/11/2016 0341   GFRAA >60 08/25/2010 1517   CBC    Component Value Date/Time   WBC 6.9 09/11/2016 0341   RBC 3.50 (L) 09/11/2016 0341   HGB 9.6 (L) 09/11/2016 0341   HGB 13.9 07/12/2016 1328   HCT 29.0 (L) 09/11/2016 0341   HCT 39.9 07/12/2016 1328   PLT 177 09/11/2016 0341   PLT 159 07/12/2016 1328    MCV 82.9 09/11/2016 0341   MCV 83 07/12/2016 1328   MCH 27.4 09/11/2016 0341   MCHC 33.1 09/11/2016 0341   RDW 16.8 (H) 09/11/2016 0341   RDW 16.8 (H) 07/12/2016 1328   LYMPHSABS 1.0 08/29/2016 0435   MONOABS 0.7 08/29/2016 0435   EOSABS 0.1 08/29/2016 0435   BASOSABS 0.0 08/29/2016 0435    Assessment/Plan:  1. AKI/CKD stage 3 due to cardiorenal syndrome, oliguric.  Started on CVVHD 6/20 and then has been off again on again, most recently from 6/29-09/09/16 with low cvp (has pulmonary HTN and needs higher filling pressures with goal cvp of 15-20).  Holding CVVHD for now with marked increase in BUN/Cr.  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 2. Acute on chronic biventricular heart failure/cardiogenic shock- on milrinone/norepi.  Holding CVVHD for now until CVP reaches 15-20 (was 15 this am) 1. Agree with starting metolazone and IV lasix and follow UOP, however may need to resume CVVHD in the next 1-2 days depending upon her response. 3. A flutter with rvr- s/p DC-CV, on amio 4. Pulmonary HTN- on sildenafil 20mg  tid 5. Hyponatremia- due to chf 6. LV thrombus- on eliquis and heparin  Irena CordsJoseph A. Stepfon Rawles, MD [facility] [phone]

## 2016-09-12 LAB — RENAL FUNCTION PANEL
ALBUMIN: 2.8 g/dL — AB (ref 3.5–5.0)
ANION GAP: 12 (ref 5–15)
Albumin: 2.8 g/dL — ABNORMAL LOW (ref 3.5–5.0)
Anion gap: 13 (ref 5–15)
BUN: 53 mg/dL — ABNORMAL HIGH (ref 6–20)
BUN: 55 mg/dL — AB (ref 6–20)
CALCIUM: 9.1 mg/dL (ref 8.9–10.3)
CHLORIDE: 89 mmol/L — AB (ref 101–111)
CO2: 22 mmol/L (ref 22–32)
CO2: 20 mmol/L — AB (ref 22–32)
Calcium: 8.9 mg/dL (ref 8.9–10.3)
Chloride: 88 mmol/L — ABNORMAL LOW (ref 101–111)
Creatinine, Ser: 4.38 mg/dL — ABNORMAL HIGH (ref 0.44–1.00)
Creatinine, Ser: 4.38 mg/dL — ABNORMAL HIGH (ref 0.44–1.00)
GFR calc Af Amer: 12 mL/min — ABNORMAL LOW (ref >60)
GFR calc non Af Amer: 10 mL/min — ABNORMAL LOW (ref >60)
GFR, EST AFRICAN AMERICAN: 12 mL/min — AB (ref >60)
GFR, EST NON AFRICAN AMERICAN: 10 mL/min — AB (ref >60)
Glucose, Bld: 283 mg/dL — ABNORMAL HIGH (ref 65–99)
Glucose, Bld: 208 mg/dL — ABNORMAL HIGH (ref 65–99)
PHOSPHORUS: 5.5 mg/dL — AB (ref 2.5–4.6)
POTASSIUM: 4.6 mmol/L (ref 3.5–5.1)
POTASSIUM: 4.6 mmol/L (ref 3.5–5.1)
Phosphorus: 6 mg/dL — ABNORMAL HIGH (ref 2.5–4.6)
SODIUM: 122 mmol/L — AB (ref 135–145)
Sodium: 122 mmol/L — ABNORMAL LOW (ref 135–145)

## 2016-09-12 LAB — GLUCOSE, CAPILLARY
GLUCOSE-CAPILLARY: 204 mg/dL — AB (ref 65–99)
GLUCOSE-CAPILLARY: 137 mg/dL — AB (ref 65–99)
Glucose-Capillary: 116 mg/dL — ABNORMAL HIGH (ref 65–99)
Glucose-Capillary: 185 mg/dL — ABNORMAL HIGH (ref 65–99)

## 2016-09-12 LAB — COOXEMETRY PANEL
CARBOXYHEMOGLOBIN: 1.6 % — AB (ref 0.5–1.5)
Methemoglobin: 1.4 % (ref 0.0–1.5)
O2 SAT: 62.5 %
TOTAL HEMOGLOBIN: 9.4 g/dL — AB (ref 12.0–16.0)

## 2016-09-12 LAB — CBC
HCT: 26.6 % — ABNORMAL LOW (ref 36.0–46.0)
Hemoglobin: 9.1 g/dL — ABNORMAL LOW (ref 12.0–15.0)
MCH: 28 pg (ref 26.0–34.0)
MCHC: 34.2 g/dL (ref 30.0–36.0)
MCV: 81.8 fL (ref 78.0–100.0)
PLATELETS: 186 10*3/uL (ref 150–400)
RBC: 3.25 MIL/uL — AB (ref 3.87–5.11)
RDW: 16.5 % — ABNORMAL HIGH (ref 11.5–15.5)
WBC: 5.6 10*3/uL (ref 4.0–10.5)

## 2016-09-12 LAB — URINE CULTURE

## 2016-09-12 LAB — HEPARIN LEVEL (UNFRACTIONATED): Heparin Unfractionated: 0.39 IU/mL (ref 0.30–0.70)

## 2016-09-12 LAB — MAGNESIUM: MAGNESIUM: 2.2 mg/dL (ref 1.7–2.4)

## 2016-09-12 NOTE — Progress Notes (Signed)
ANTICOAGULATION CONSULT NOTE - Follow Up Consult  Pharmacy Consult for Heparin Indication: atrial fibrillation  Allergies  Allergen Reactions  . Nyquil Multi-Symptom [Pseudoeph-Doxylamine-Dm-Apap] Other (See Comments)    Fatigue, chest pressure and pain Pt tolerates Coricidin cough syrup at home  . Sudafed [Pseudoephedrine] Other (See Comments)    Fatigue, chest pressure and pain  . Lasix [Furosemide]     Rash  . Torsemide     Rash  . Zocor [Simvastatin]     Myalgias   . Latex Rash  . Ramipril Cough    Patient Measurements: Height: 5\' 5"  (165.1 cm) Weight: 130 lb 9.6 oz (59.2 kg) IBW/kg (Calculated) : 57  Vital Signs: Temp: 97.9 F (36.6 C) (07/04 0400) Temp Source: Oral (07/04 0400) BP: 101/57 (07/04 0700) Pulse Rate: 53 (07/04 0700)  Labs:  Recent Labs  09/10/16 0402  09/11/16 0341 09/11/16 0730 09/11/16 1556 09/12/16 0358  HGB 9.5*  --  9.6*  --   --  9.1*  HCT 28.5*  --  29.0*  --   --  26.6*  PLT 176  --  177  --   --  186  HEPARINUNFRC 0.32  --   --  0.53  --  0.39  CREATININE 3.18*  < > 4.13*  --  4.34* 4.38*  < > = values in this interval not displayed.  Estimated Creatinine Clearance: 12.6 mL/min (A) (by C-G formula based on SCr of 4.38 mg/dL (H)).  Medications: Heparin @ 1250 units/hr  Assessment: 56yof on eliquis for afib, s/p DCCV on 6/18, transitioned to IV heparin 6/20 for CVVHD. CVVHD stopped 6/27, temp IJ catheter placed 6/28, heparin resumed, and CVVHD resumed 6/29. CRRT stopped again 7/1 Cr elevated stable some uop 1200ml last 24h   Heparin level is therapeutic at 0.39. CBC stable. No bleeding noted.  Goal of Therapy:  Heparin level 0.3-0.7 units/ml Monitor platelets by anticoagulation protocol: Yes   Plan:  1) Continue heparin at 1250 units/hr 2) Daily heparin level and CBC 3) Follow up plan for oral anticoagulation eventually.  Leota SauersLisa Ayaansh Smail Pharm.D. CPP, BCPS Clinical Pharmacist [phone] 09/12/2016 7:43 AM

## 2016-09-12 NOTE — Plan of Care (Signed)
Problem: Activity: Goal: Activity intolerance will improve Outcome: Progressing Pt walked x2 7/3  Problem: Fluid Volume: Goal: Fluid volume balance will be maintained or improved Outcome: Progressing CVP 8-13 7/3 PM  Problem: Cardiac: Goal: Cardiac symptoms related to disease process will be avoided Outcome: Progressing Within parameters

## 2016-09-12 NOTE — Progress Notes (Addendum)
Patient ID: Jessica [name], female   DOB: Jun 06, 1960, 56 y.o.   MRN: [id number]   Advanced Heart Failure Rounding Note   Subjective:    Admitted with new onset atrial flutter and ADHF. Milrinone started 6/15 for worsening HF and renal failure.  S/P TEE and successful DC-CV.   Moved to ICU on 6/19 due to low output and worsening AKI. CVVHD catheter placed. CVVHD stopped 6/27. Resumed 6/29.  09/08/16 N  Remains on Norepi 8 and milrinone 0.375. Yesterday had ~1300cc out with one dose lasix and metolazone. CVP 12 today. Feels well. Creatinine now leveling out at 4.3. Co-ox 63%  Objective:   Weight Range:  Vital Signs:   Temp:  [97.7 F (36.5 C)-98.5 F (36.9 C)] 98.4 F (36.9 C) (07/04 0824) Pulse Rate:  [42-96] 53 (07/04 0700) Resp:  [13-23] 15 (07/04 0700) BP: (91-121)/(42-91) 101/57 (07/04 0700) SpO2:  [82 %-100 %] 96 % (07/04 0700) Weight:  [59.2 kg (130 lb 9.6 oz)] 59.2 kg (130 lb 9.6 oz) (07/04 0500) Last BM Date: 09/11/16  Weight change: Filed Weights   09/10/16 0300 09/11/16 0400 09/12/16 0500  Weight: 55.9 kg (123 lb 3.2 oz) 58.3 kg (128 lb 8.5 oz) 59.2 kg (130 lb 9.6 oz)    Intake/Output:   Intake/Output Summary (Last 24 hours) at 09/12/16 0904 Last data filed at 09/12/16 0600  Gross per 24 hour  Intake              958 ml  Output             1250 ml  Net             -292 ml     Physical Exam: CVP 12 General:  Lying in bed . No resp difficulty HEENT: normal poor dentition Neck: supple. RIJ Trialysis.JVP to jaw. Carotids 2+ bilat; no bruits. No lymphadenopathy or thryomegaly appreciated. Cor: PMI laterally displaced. Brady regular +s3 Lungs: clear Abdomen: soft, nontender, nondistended. No hepatosplenomegaly. No bruits or masses. Good bowel sounds. Extremities: no cyanosis, clubbing, rash, 1+ edema Neuro: alert & orientedx3, cranial nerves grossly intact. moves all 4 extremities w/o difficulty. Affect pleasant    Telemetry: Sinus brady 50-60s Personally  reviewed   Labs: Basic Metabolic Panel:  Recent Labs Lab 09/08/16 0400  09/09/16 0402 09/10/16 0402 09/10/16 1607 09/10/16 2355 09/11/16 0341 09/11/16 1556 09/12/16 0358  NA 131*  < > 130* 124* 124* 121* 121* 120* 122*  K 4.8  < > 4.4 4.4 4.7 4.9 5.0 5.1 4.6  CL 97*  < > 97* 92* 93* 90* 91* 89* 88*  CO2 26  < > 26 25 23 26  20* 20* 22  GLUCOSE 131*  < > 216* 318* 314* 325* 303* 271* 283*  BUN 21*  < > 15 25* 31* 36* 40* 46* 53*  CREATININE 2.58*  < > 1.98* 3.18* 3.47* 4.06* 4.13* 4.34* 4.38*  CALCIUM 8.9  < > 8.8* 8.9 8.9 8.8* 8.8* 9.1 9.1  MG 2.7*  --  2.5* 2.2  --   --  2.2  --  2.2  PHOS 2.6  < > 2.0* 3.2 3.4 4.0 4.3 4.6 5.5*  < > = values in this interval not displayed.  Liver Function Tests:  Recent Labs Lab 09/10/16 1607 09/10/16 2355 09/11/16 0341 09/11/16 1556 09/12/16 0358  ALBUMIN 2.8* 2.9* 2.8* 2.9* 2.8*   No results for input(s): LIPASE, AMYLASE in the last 168 hours. No results for input(s): AMMONIA in the last 168 hours.  CBC:  Recent Labs Lab 09/08/16 0400 09/09/16 0402 09/10/16 0402 09/11/16 0341 09/12/16 0358  WBC 5.4 6.7 7.2 6.9 5.6  HGB 10.7* 10.3* 9.5* 9.6* 9.1*  HCT 30.8* 30.8* 28.5* 29.0* 26.6*  MCV 81.3 83.2 82.8 82.9 81.8  PLT 156 173 176 177 186    Cardiac Enzymes: No results for input(s): CKTOTAL, CKMB, CKMBINDEX, TROPONINI in the last 168 hours.  BNP: BNP (last 3 results)  Recent Labs  09/28/15 1114  BNP 871.3*    ProBNP (last 3 results) No results for input(s): PROBNP in the last 8760 hours.    Other results:  Imaging: No results found.   Medications:     Scheduled Medications: . amiodarone  200 mg Oral Daily  . aspirin EC  81 mg Oral Daily  . Chlorhexidine Gluconate Cloth  6 each Topical Q0600  . insulin aspart  0-9 Units Subcutaneous TID WC  . insulin aspart  2 Units Subcutaneous TID WC  . insulin glargine  10 Units Subcutaneous Daily  . pravastatin  20 mg Oral q1800  . sildenafil  20 mg Oral TID    . sodium chloride flush  10-40 mL Intracatheter Q12H    Infusions: . cefTRIAXone (ROCEPHIN)  IV Stopped (09/11/16 1114)  . heparin 1,250 Units/hr (09/12/16 0600)  . milrinone 0.375 mcg/kg/min (09/12/16 0600)  . norepinephrine (LEVOPHED) Adult infusion 8 mcg/min (09/12/16 0600)  . sodium chloride      PRN Medications: acetaminophen, albuterol, benzonatate, chlorpheniramine-HYDROcodone, docusate sodium, heparin, hydrOXYzine, mineral oil-hydrophilic petrolatum, ondansetron (ZOFRAN) IV, sodium chloride, sodium chloride flush   Patient Profile:   Jessica [name] is a 56 year old with h/o chronic systolic heart failure, ICM, Biotronik ICD, and PAH admitted new onset AFL and cardiogenic shock (EF 10%). Developed AKI requiring CVVHD.  Assessment/Plan/Discussion:    1. A/C Systolic Biventricular Heart Failure -> cardiogenic shock:  - Ischemic cardiomyopathy.  Echo 08/24/16. LVEF 10% severe RV HK (EF down further in setting of AFL).   - Remains on milrinone 0.375 mcg/kg/min and Norepi 8 mcg. Coox 63% this am. - CVP 12. - Renal function seems to be recovering will hold diuretics today until CVP >= 15 and then redose lasix and metolazone. Will likely give lasix 160 and metolazone 5mg  again in am - If/when renal function returns will then need to work on weaning inotropes.  - No bb or ACE/ARB/ARNI with low output and AKI .   2. A flutter RVR:  - New onset June 11 with device interrogation. S/P TEE DC-CV 6/18 with conversion to NSR.  - Maintaining SR. Amio turned down to 200 daily several days ago due to severe bradycardia. HR now 50- 60s. Continue heparin drip. (Was on Eliquis at home)  - Hgb 10.3 -> 9.5 ->9.6-> 9.1 - Discussed with PharmD  3. Acute on chronic renal failure (baseline CKD 3) due to cardio-renal syndrome.  - CVVHD started 6/20. Restarted 6/29. Held 09/09/16 with low volume status.   - As above renal function seems to be recvoering some. Yet to be seen what new baseline will be. Will  give lasix and metolazone in am when CVP > = 15 so as not to overstress kidneys in face of RHF  4. PAH - Continue sildenafil 20 mg TID for now. No change.   .  5. CAD S/P multiple interventions.  - No s/s ischemia  Continue 81 mg aspirin. Continue pravastatin.   - No change.  6. PAD:  -occluded left SFA with mild/mod abnormal ABIs. No revascularization  was needed October 2017. No change.   7. H/O LV Thrombus: no thrombus on TEE. Remains on heparin for AFL. No change.  8. Hyponatremia:  - Serum sodium 120-121-. continue free water restrict.   9. Deconditioning - PT following. Recommending HHPT.     10. ID - Tmax 99.2. UA positive. BCx pending - Rocephin started 7/3. UCx pending.  11. DM2 - CBG running 200-250. Insulin adjusted yesterday. Covering with SSI.  Arvilla Meres, MD  09/12/2016 9:04 AM  Advanced Heart Failure Team Pager [phone] (M-F; 7a - 4p)  Please contact [facility] for night-coverage after hours (4p -7a ) and weekends on [email or web address]

## 2016-09-12 NOTE — Progress Notes (Signed)
Physical Therapy Treatment Patient Details Name: Jessica MoccasinSherita [name] MRN: [id number] DOB: 1961-01-25 Today's Date: 09/12/2016    History of Present Illness 56yof on eliquis for afib, s/p DCCV on 6/18, transitioned to IV heparin 6/20 anticipating CVVHD due to Acute oliguric kidney injury: secondary to decompensated HF.  CVVHD stopped 09/05/16.       PT Comments    Pt admitted with above diagnosis. Pt currently with functional limitations due to balance and endurance deficits. Pt was able to ambulate but did not feel well so she cut walk a little short.  Pt felt Slightly less steady per report but no LOB noted. Will continue to follow.   Pt will benefit from skilled PT to increase their independence and safety with mobility to allow discharge to the venue listed below.     Follow Up Recommendations  Home health PT;Supervision/Assistance - 24 hour     Equipment Recommendations  None recommended by PT    Recommendations for Other Services       Precautions / Restrictions Precautions Precautions: Fall Restrictions Weight Bearing Restrictions: No    Mobility  Bed Mobility               General bed mobility comments: Sitting EOB on arrival.   Transfers Overall transfer level: Needs assistance Equipment used: Rolling walker (2 wheeled) Transfers: Sit to/from Stand Sit to Stand: Min guard         General transfer comment: No assist needed to stand.  Cues given for hand placement.   Ambulation/Gait Ambulation/Gait assistance: Min guard Ambulation Distance (Feet): 270 Feet Assistive device: Rolling walker (2 wheeled) Gait Pattern/deviations: Step-through pattern;Decreased stride length   Gait velocity interpretation: <1.8 ft/sec, indicative of risk for recurrent falls General Gait Details: Pt was able to ambulate with RW with cues for sequencing steps and RW.  Pt was able to ambulate with steadying assist only at times.  Pt not feeling as well today but still was able to  ambualte but cut walk a little shorter.     Stairs            Wheelchair Mobility    Modified Rankin (Stroke Patients Only)       Balance Overall balance assessment: Needs assistance Sitting-balance support: No upper extremity supported;Feet supported Sitting balance-Leahy Scale: Good     Standing balance support: Bilateral upper extremity supported;During functional activity Standing balance-Leahy Scale: Fair Standing balance comment: can stand statically without UE support.                             Cognition Arousal/Alertness: Awake/alert Behavior During Therapy: WFL for tasks assessed/performed Overall Cognitive Status: Within Functional Limits for tasks assessed                                        Exercises      General Comments        Pertinent Vitals/Pain Pain Assessment: No/denies pain    Home Living                      Prior Function            PT Goals (current goals can now be found in the care plan section) Progress towards PT goals: Progressing toward goals    Frequency    Min 3X/week  PT Plan Current plan remains appropriate    Co-evaluation              AM-PAC PT "6 Clicks" Daily Activity  Outcome Measure  Difficulty turning over in bed (including adjusting bedclothes, sheets and blankets)?: None Difficulty moving from lying on back to sitting on the side of the bed? : None Difficulty sitting down on and standing up from a chair with arms (e.g., wheelchair, bedside commode, etc,.)?: A Little Help needed moving to and from a bed to chair (including a wheelchair)?: A Little Help needed walking in hospital room?: A Little Help needed climbing 3-5 steps with a railing? : A Little 6 Click Score: 20    End of Session Equipment Utilized During Treatment: Gait belt;Oxygen Activity Tolerance: Patient limited by fatigue Patient left: with call bell/phone within reach;in bed Nurse  Communication: Mobility status PT Visit Diagnosis: Unsteadiness on feet (R26.81);Muscle weakness (generalized) (M62.81)     Time: 1610-9604 PT Time Calculation (min) (ACUTE ONLY): 15 min  Charges:  $Gait Training: 8-22 mins                    G Codes:       Zackerie Sara,PT Acute Rehabilitation [phone] (pager)    Berline Lopes 09/12/2016, 12:55 PM

## 2016-09-12 NOTE — Progress Notes (Signed)
Patient ID: Jessica [name], female   DOB: 06-21-1960, 56 y.o.   MRN: [id number] S:Feels better, no c/o O:BP (!) 112/53   Pulse (!) 52   Temp 98.4 F (36.9 C) (Oral)   Resp 14   Ht 5\' 5"  (1.651 m)   Wt 59.2 kg (130 lb 9.6 oz)   SpO2 93%   BMI 21.73 kg/m   Intake/Output Summary (Last 24 hours) at 09/12/16 1109 Last data filed at 09/12/16 1000  Gross per 24 hour  Intake             1184 ml  Output             1250 ml  Net              -66 ml   Intake/Output: I/O last 3 completed shifts: In: 1679 [P.O.:725; I.V.:954] Out: 1450 [Urine:1450]  Intake/Output this shift:  Total I/O In: 226 [P.O.:120; I.V.:106] Out: -  Weight change: 0.94 kg (2 lb 1.2 oz) Gen:nad NWG:NFAOZHYQMVH, no rub Resp:decreased BS at bases Abd:+BS, soft QIO:NGEXB edema   Recent Labs Lab 09/09/16 0402 09/10/16 0402 09/10/16 1607 09/10/16 2355 09/11/16 0341 09/11/16 1556 09/12/16 0358  NA 130* 124* 124* 121* 121* 120* 122*  K 4.4 4.4 4.7 4.9 5.0 5.1 4.6  CL 97* 92* 93* 90* 91* 89* 88*  CO2 26 25 23 26  20* 20* 22  GLUCOSE 216* 318* 314* 325* 303* 271* 283*  BUN 15 25* 31* 36* 40* 46* 53*  CREATININE 1.98* 3.18* 3.47* 4.06* 4.13* 4.34* 4.38*  ALBUMIN 2.9* 2.8* 2.8* 2.9* 2.8* 2.9* 2.8*  CALCIUM 8.8* 8.9 8.9 8.8* 8.8* 9.1 9.1  PHOS 2.0* 3.2 3.4 4.0 4.3 4.6 5.5*   Liver Function Tests:  Recent Labs Lab 09/11/16 0341 09/11/16 1556 09/12/16 0358  ALBUMIN 2.8* 2.9* 2.8*   No results for input(s): LIPASE, AMYLASE in the last 168 hours. No results for input(s): AMMONIA in the last 168 hours. CBC:  Recent Labs Lab 09/08/16 0400 09/09/16 0402 09/10/16 0402 09/11/16 0341 09/12/16 0358  WBC 5.4 6.7 7.2 6.9 5.6  HGB 10.7* 10.3* 9.5* 9.6* 9.1*  HCT 30.8* 30.8* 28.5* 29.0* 26.6*  MCV 81.3 83.2 82.8 82.9 81.8  PLT 156 173 176 177 186   Cardiac Enzymes: No results for input(s): CKTOTAL, CKMB, CKMBINDEX, TROPONINI in the last 168 hours. CBG:  Recent Labs Lab 09/11/16 1134 09/11/16 1137  09/11/16 1600 09/11/16 2131 09/12/16 0821  GLUCAP 69 208* 270* 208* 204*    Iron Studies: No results for input(s): IRON, TIBC, TRANSFERRIN, FERRITIN in the last 72 hours. Studies/Results: No results found. [name] amiodarone  200 mg Oral Daily  . aspirin EC  81 mg Oral Daily  . Chlorhexidine Gluconate Cloth  6 each Topical Q0600  . insulin aspart  0-9 Units Subcutaneous TID WC  . insulin aspart  2 Units Subcutaneous TID WC  . insulin glargine  10 Units Subcutaneous Daily  . pravastatin  20 mg Oral q1800  . sildenafil  20 mg Oral TID  . sodium chloride flush  10-40 mL Intracatheter Q12H    BMET    Component Value Date/Time   NA 122 (L) 09/12/2016 0358   NA 137 07/12/2016 1328   K 4.6 09/12/2016 0358   CL 88 (L) 09/12/2016 0358   CO2 22 09/12/2016 0358   GLUCOSE 283 (H) 09/12/2016 0358   BUN 53 (H) 09/12/2016 0358   BUN 32 (H) 07/12/2016 1328   CREATININE 4.38 (H) 09/12/2016 2841  CREATININE 0.91 08/25/2010 1517   CALCIUM 9.1 09/12/2016 0358   GFRNONAA 10 (L) 09/12/2016 0358   GFRNONAA >60 08/25/2010 1517   GFRAA 12 (L) 09/12/2016 0358   GFRAA >60 08/25/2010 1517   CBC    Component Value Date/Time   WBC 5.6 09/12/2016 0358   RBC 3.25 (L) 09/12/2016 0358   HGB 9.1 (L) 09/12/2016 0358   HGB 13.9 07/12/2016 1328   HCT 26.6 (L) 09/12/2016 0358   HCT 39.9 07/12/2016 1328   PLT 186 09/12/2016 0358   PLT 159 07/12/2016 1328   MCV 81.8 09/12/2016 0358   MCV 83 07/12/2016 1328   MCH 28.0 09/12/2016 0358   MCHC 34.2 09/12/2016 0358   RDW 16.5 (H) 09/12/2016 0358   RDW 16.8 (H) 07/12/2016 1328   LYMPHSABS 1.0 08/29/2016 0435   MONOABS 0.7 08/29/2016 0435   EOSABS 0.1 08/29/2016 0435   BASOSABS 0.0 08/29/2016 0435     Assessment/Plan:  1. AKI/CKD stage 3 due to cardiorenal syndrome, oliguric. Started on CVVHD 6/20 and then has been off again on again, most recently from 6/29-09/09/16 with low cvp (has pulmonary HTN and needs higher filling pressures with goal cvp of  15-20). Holding CVVHD for now with marked increase in BUN/Cr. 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 1. Thankfully she is responding to IV diuretics and increased CVP with increased UOP and stable Scr. 2. Continue to hold CVVHD and follow. 2. Acute on chronic biventricular heart failure/cardiogenic shock- on milrinone/norepi. Holding CVVHD for now until CVP reaches 15-20 (was 15 this am) 1. Agree with continuing metolazone and IV lasix and follow UOP, however may need to resume CVVHD depending upon her response. 3. A flutter with rvr- s/p DC-CV, on amio 4. Pulmonary HTN- on sildenafil 20mg  tid 5. Hyponatremia- due to chf 6. LV thrombus- on eliquis and heparin  Irena CordsJoseph A. Stark Aguinaga, MD [facility] [phone]

## 2016-09-13 LAB — MAGNESIUM: Magnesium: 2.2 mg/dL (ref 1.7–2.4)

## 2016-09-13 LAB — RENAL FUNCTION PANEL
ALBUMIN: 3.1 g/dL — AB (ref 3.5–5.0)
Albumin: 3 g/dL — ABNORMAL LOW (ref 3.5–5.0)
Anion gap: 12 (ref 5–15)
Anion gap: 10 (ref 5–15)
BUN: 61 mg/dL — ABNORMAL HIGH (ref 6–20)
BUN: 64 mg/dL — AB (ref 6–20)
CALCIUM: 9 mg/dL (ref 8.9–10.3)
CALCIUM: 9.2 mg/dL (ref 8.9–10.3)
CO2: 21 mmol/L — ABNORMAL LOW (ref 22–32)
CO2: 22 mmol/L (ref 22–32)
CREATININE: 4.42 mg/dL — AB (ref 0.44–1.00)
CREATININE: 4.3 mg/dL — AB (ref 0.44–1.00)
Chloride: 88 mmol/L — ABNORMAL LOW (ref 101–111)
Chloride: 91 mmol/L — ABNORMAL LOW (ref 101–111)
GFR calc Af Amer: 12 mL/min — ABNORMAL LOW (ref >60)
GFR calc non Af Amer: 10 mL/min — ABNORMAL LOW (ref >60)
GFR, EST AFRICAN AMERICAN: 12 mL/min — AB (ref >60)
GFR, EST NON AFRICAN AMERICAN: 10 mL/min — AB (ref >60)
GLUCOSE: 136 mg/dL — AB (ref 65–99)
Glucose, Bld: 253 mg/dL — ABNORMAL HIGH (ref 65–99)
PHOSPHORUS: 5.6 mg/dL — AB (ref 2.5–4.6)
PHOSPHORUS: 6.2 mg/dL — AB (ref 2.5–4.6)
Potassium: 4.8 mmol/L (ref 3.5–5.1)
Potassium: 4.5 mmol/L (ref 3.5–5.1)
SODIUM: 121 mmol/L — AB (ref 135–145)
SODIUM: 123 mmol/L — AB (ref 135–145)

## 2016-09-13 LAB — GLUCOSE, CAPILLARY
GLUCOSE-CAPILLARY: 182 mg/dL — AB (ref 65–99)
Glucose-Capillary: 209 mg/dL — ABNORMAL HIGH (ref 65–99)
Glucose-Capillary: 158 mg/dL — ABNORMAL HIGH (ref 65–99)
Glucose-Capillary: 138 mg/dL — ABNORMAL HIGH (ref 65–99)

## 2016-09-13 LAB — CBC
HCT: 26.9 % — ABNORMAL LOW (ref 36.0–46.0)
HEMOGLOBIN: 9.1 g/dL — AB (ref 12.0–15.0)
MCH: 27.7 pg (ref 26.0–34.0)
MCHC: 33.8 g/dL (ref 30.0–36.0)
MCV: 81.8 fL (ref 78.0–100.0)
Platelets: 202 10*3/uL (ref 150–400)
RBC: 3.29 MIL/uL — ABNORMAL LOW (ref 3.87–5.11)
RDW: 16.4 % — ABNORMAL HIGH (ref 11.5–15.5)
WBC: 4.9 10*3/uL (ref 4.0–10.5)

## 2016-09-13 LAB — HEPARIN LEVEL (UNFRACTIONATED): HEPARIN UNFRACTIONATED: 0.5 [IU]/mL (ref 0.30–0.70)

## 2016-09-13 LAB — COOXEMETRY PANEL
Carboxyhemoglobin: 1.6 % — ABNORMAL HIGH (ref 0.5–1.5)
Methemoglobin: 1.1 % (ref 0.0–1.5)
O2 Saturation: 59.8 %
TOTAL HEMOGLOBIN: 9.6 g/dL — AB (ref 12.0–16.0)

## 2016-09-13 MED ORDER — METOLAZONE 5 MG PO TABS
5.0000 mg | ORAL_TABLET | Freq: Once | ORAL | Status: AC
  Administered 2016-09-13: 5 mg via ORAL
  Filled 2016-09-13: qty 1

## 2016-09-13 MED ORDER — FUROSEMIDE 10 MG/ML IJ SOLN
160.0000 mg | Freq: Once | INTRAVENOUS | Status: AC
  Administered 2016-09-13: 160 mg via INTRAVENOUS
  Filled 2016-09-13: qty 16

## 2016-09-13 MED ORDER — WHITE PETROLATUM GEL
Status: AC
  Administered 2016-09-13: 15:00:00
  Filled 2016-09-13: qty 1

## 2016-09-13 NOTE — Progress Notes (Signed)
Patient ID: Jessica [name], female   DOB: 02-25-1961, 56 y.o.   MRN: [id number] S:No complaints and eating breakfast  O:BP 124/69   Pulse (!) 53   Temp 98.8 F (37.1 C) (Oral)   Resp 18   Ht 5\' 5"  (1.651 m)   Wt 57.9 kg (127 lb 10.3 oz)   SpO2 97%   BMI 21.24 kg/m   Intake/Output Summary (Last 24 hours) at 09/13/16 0841 Last data filed at 09/13/16 0700  Gross per 24 hour  Intake             1209 ml  Output              900 ml  Net              309 ml   Intake/Output: I/O last 3 completed shifts: In: 1928.5 [P.O.:915; I.V.:1013.5] Out: 1500 [Urine:1500]  Intake/Output this shift:  No intake/output data recorded. Weight change: -1.34 kg (-2 lb 15.3 oz) Gen:NAD CVS: bradycardic Resp:bibasilar crackles NWG:NFAOZH Ext:no edema   Recent Labs Lab 09/10/16 1607 09/10/16 2355 09/11/16 0341 09/11/16 1556 09/12/16 0358 09/12/16 1603 09/13/16 0521  NA 124* 121* 121* 120* 122* 122* 121*  K 4.7 4.9 5.0 5.1 4.6 4.6 4.8  CL 93* 90* 91* 89* 88* 89* 88*  CO2 23 26 20* 20* 22 20* 21*  GLUCOSE 314* 325* 303* 271* 283* 208* 253*  BUN 31* 36* 40* 46* 53* 55* 61*  CREATININE 3.47* 4.06* 4.13* 4.34* 4.38* 4.38* 4.42*  ALBUMIN 2.8* 2.9* 2.8* 2.9* 2.8* 2.8* 3.0*  CALCIUM 8.9 8.8* 8.8* 9.1 9.1 8.9 9.0  PHOS 3.4 4.0 4.3 4.6 5.5* 6.0* 5.6*   Liver Function Tests:  Recent Labs Lab 09/12/16 0358 09/12/16 1603 09/13/16 0521  ALBUMIN 2.8* 2.8* 3.0*   No results for input(s): LIPASE, AMYLASE in the last 168 hours. No results for input(s): AMMONIA in the last 168 hours. CBC:  Recent Labs Lab 09/09/16 0402 09/10/16 0402 09/11/16 0341 09/12/16 0358 09/13/16 0521  WBC 6.7 7.2 6.9 5.6 4.9  HGB 10.3* 9.5* 9.6* 9.1* 9.1*  HCT 30.8* 28.5* 29.0* 26.6* 26.9*  MCV 83.2 82.8 82.9 81.8 81.8  PLT 173 176 177 186 202   Cardiac Enzymes: No results for input(s): CKTOTAL, CKMB, CKMBINDEX, TROPONINI in the last 168 hours. CBG:  Recent Labs Lab 09/12/16 0821 09/12/16 1255  09/12/16 1558 09/12/16 2144 09/13/16 0714  GLUCAP 204* 137* 116* 185* 182*    Iron Studies: No results for input(s): IRON, TIBC, TRANSFERRIN, FERRITIN in the last 72 hours. Studies/Results: No results found. [name] amiodarone  200 mg Oral Daily  . aspirin EC  81 mg Oral Daily  . Chlorhexidine Gluconate Cloth  6 each Topical Q0600  . insulin aspart  0-9 Units Subcutaneous TID WC  . insulin aspart  2 Units Subcutaneous TID WC  . insulin glargine  10 Units Subcutaneous Daily  . pravastatin  20 mg Oral q1800  . sildenafil  20 mg Oral TID  . sodium chloride flush  10-40 mL Intracatheter Q12H    BMET    Component Value Date/Time   NA 121 (L) 09/13/2016 0521   NA 137 07/12/2016 1328   K 4.8 09/13/2016 0521   CL 88 (L) 09/13/2016 0521   CO2 21 (L) 09/13/2016 0521   GLUCOSE 253 (H) 09/13/2016 0521   BUN 61 (H) 09/13/2016 0521   BUN 32 (H) 07/12/2016 1328   CREATININE 4.42 (H) 09/13/2016 0521   CREATININE 0.91 08/25/2010 1517  CALCIUM 9.0 09/13/2016 0521   GFRNONAA 10 (L) 09/13/2016 0521   GFRNONAA >60 08/25/2010 1517   GFRAA 12 (L) 09/13/2016 0521   GFRAA >60 08/25/2010 1517   CBC    Component Value Date/Time   WBC 4.9 09/13/2016 0521   RBC 3.29 (L) 09/13/2016 0521   HGB 9.1 (L) 09/13/2016 0521   HGB 13.9 07/12/2016 1328   HCT 26.9 (L) 09/13/2016 0521   HCT 39.9 07/12/2016 1328   PLT 202 09/13/2016 0521   PLT 159 07/12/2016 1328   MCV 81.8 09/13/2016 0521   MCV 83 07/12/2016 1328   MCH 27.7 09/13/2016 0521   MCHC 33.8 09/13/2016 0521   RDW 16.4 (H) 09/13/2016 0521   RDW 16.8 (H) 07/12/2016 1328   LYMPHSABS 1.0 08/29/2016 0435   MONOABS 0.7 08/29/2016 0435   EOSABS 0.1 08/29/2016 0435   BASOSABS 0.0 08/29/2016 0435     Assessment/Plan:  1. AKI/CKD stage 3 due to cardiorenal syndrome, oliguric. Started on CVVHD 6/20 and then has been off again on again, most recently from 6/29-09/09/16 with low cvp (has pulmonary HTN and needs higher filling pressures with goal cvp  of 15-20). Holding CVVHD for now with marked increase in BUN/Cr. 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 1. Thankfully she is responding to IV diuretics and increased CVP with increased UOP and stable Scr. 2. Continue to hold CVVHD and follow. 3. Would recommend removing HD cath and not restarting CVVHD as she is not a candidate for longterm dialysis and is currently pressor dependent.  CVVHD is merely a temporizing measure and has helped somewhat but overall her prognosis remains poor.  4. Recommend palliative care consult to help set goals/limits of care 2. Acute on chronic biventricular heart failure/cardiogenic shock- on milrinone/norepi. Holding CVVHD for now until CVP reaches 15-20 (was 13-14this am) 1. Agree with continuing metolazone and IV lasix based on CVP and follow UOP, however I have reservations about resuming CVVHD as she remains pressor dependent and she is not able to tolerate IHD. 3. A flutter with rvr- s/p DC-CV, on amio 4. Pulmonary HTN- on sildenafil 20mg  tid 5. Anemia of chronic disease- not sure if ESA is appropriate in her situation 6. Hyponatremia- due to chf 7. LV thrombus- on eliquis and heparin 8. Vascular access-  RIJ temporary catheter placed 09/05/16, will need to be replaced or removed in the next 2 days to prevent catheter-related bacteremia.  Irena CordsJoseph A. Trudie Cervantes, MD [facility] [phone]

## 2016-09-13 NOTE — Progress Notes (Signed)
ANTICOAGULATION CONSULT NOTE - Follow Up Consult  Pharmacy Consult for Heparin Indication: atrial fibrillation  Allergies  Allergen Reactions  . Nyquil Multi-Symptom [Pseudoeph-Doxylamine-Dm-Apap] Other (See Comments)    Fatigue, chest pressure and pain Pt tolerates Coricidin cough syrup at home  . Sudafed [Pseudoephedrine] Other (See Comments)    Fatigue, chest pressure and pain  . Lasix [Furosemide]     Rash  . Torsemide     Rash  . Zocor [Simvastatin]     Myalgias   . Latex Rash  . Ramipril Cough    Patient Measurements: Height: 5\' 5"  (165.1 cm) Weight: 127 lb 10.3 oz (57.9 kg) IBW/kg (Calculated) : 57  Vital Signs: Temp: 98.4 F (36.9 C) (07/05 1117) Temp Source: Oral (07/05 1117) BP: 111/55 (07/05 1100) Pulse Rate: 57 (07/05 1100)  Labs:  Recent Labs  09/11/16 0341 09/11/16 0730  09/12/16 0358 09/12/16 1603 09/13/16 0521  HGB 9.6*  --   --  9.1*  --  9.1*  HCT 29.0*  --   --  26.6*  --  26.9*  PLT 177  --   --  186  --  202  HEPARINUNFRC  --  0.53  --  0.39  --  0.50  CREATININE 4.13*  --   < > 4.38* 4.38* 4.42*  < > = values in this interval not displayed.  Estimated Creatinine Clearance: 12.5 mL/min (A) (by C-G formula based on SCr of 4.42 mg/dL (H)).  Medications: Heparin @ 1250 units/hr  Assessment: 56yof on eliquis for afib, s/p DCCV on 6/18, transitioned to IV heparin 6/20 anticipating CVVHD. CVVHD stopped 6/27, temp IJ catheter placed 6/28, heparin resumed, and CVVHD resumed 6/29. CRRT stopped again 7/1.   Heparin level is therapeutic at 0.50. CBC stable. No bleeding noted.  Goal of Therapy:  Heparin level 0.3-0.7 units/ml Monitor platelets by anticoagulation protocol: Yes   Plan:  1) Continue heparin at 1250 units/hr 2) Daily heparin level and CBC 3) Follow up plan for oral anticoagulation eventually.  Louie CasaJennifer Mc Bloodworth, PharmD, BCPS  09/13/2016 11:45 AM

## 2016-09-13 NOTE — Progress Notes (Signed)
Patient ID: Jessica [name], female   DOB: 04/20/1960, 56 y.o.   MRN: [id number]   Advanced Heart Failure Rounding Note   Subjective:    Admitted with new onset atrial flutter and ADHF. Milrinone started 6/15 for worsening HF and renal failure.  S/P TEE and successful DC-CV.   Moved to ICU on 6/19 due to low output and worsening AKI. CVVHD catheter placed. CVVHD stopped 6/27. Resumed 6/29.  09/08/16 N  Coox 59.8% on milrinone 0.375 and Norepi 8. Had 900 cc of UO yesterday. (Diuretics held).  CVP 13-14 today on my personal check at bedside.   Feels Ok. Feels like UO was OK yesterday. Denies SOB, lightheadedness, dizziness, or CP. Wants metolazone again today.   Creatinine 4.42 this am. Rate of rise slowed.  K 4.8. BUN 61.  Objective:   Weight Range:  Vital Signs:   Temp:  [98.3 F (36.8 C)-98.9 F (37.2 C)] 98.8 F (37.1 C) (07/05 0712) Pulse Rate:  [46-60] 53 (07/05 0700) Resp:  [13-23] 18 (07/05 0700) BP: (96-124)/(53-84) 124/69 (07/05 0700) SpO2:  [85 %-100 %] 97 % (07/05 0700) Weight:  [127 lb 10.3 oz (57.9 kg)] 127 lb 10.3 oz (57.9 kg) (07/05 0500) Last BM Date: 09/11/16  Weight change: Filed Weights   09/11/16 0400 09/12/16 0500 09/13/16 0500  Weight: 128 lb 8.5 oz (58.3 kg) 130 lb 9.6 oz (59.2 kg) 127 lb 10.3 oz (57.9 kg)    Intake/Output:   Intake/Output Summary (Last 24 hours) at 09/13/16 0727 Last data filed at 09/13/16 0300  Gross per 24 hour  Intake              870 ml  Output              900 ml  Net              -30 ml     Physical Exam: CVP 13-14 General: Lying in bed.  No resp difficulty. HEENT: Normal x for poor dentition Neck: Supple. RIJ Trialysis. JVP to jaw. Carotids 2+ bilat; no bruits. No thyromegaly or nodule noted. Cor: PMI nondisplaced. Brady, Regular. + S3. Lungs: Mildly diminished basilar sounds.  Abdomen: Soft, non-tender, non-distended, no HSM. No bruits or masses. +BS  Extremities: No cyanosis, clubbing, or rash. Trace to 1+ edema.   Neuro: Alert & orientedx3, cranial nerves grossly intact. moves all 4 extremities w/o difficulty. Affect pleasant    Telemetry: Personally reviewed, NSR 50-60s  Labs: Basic Metabolic Panel:  Recent Labs Lab 09/09/16 0402 09/10/16 0402  09/11/16 0341 09/11/16 1556 09/12/16 0358 09/12/16 1603 09/13/16 0521  NA 130* 124*  < > 121* 120* 122* 122* 121*  K 4.4 4.4  < > 5.0 5.1 4.6 4.6 4.8  CL 97* 92*  < > 91* 89* 88* 89* 88*  CO2 26 25  < > 20* 20* 22 20* 21*  GLUCOSE 216* 318*  < > 303* 271* 283* 208* 253*  BUN 15 25*  < > 40* 46* 53* 55* 61*  CREATININE 1.98* 3.18*  < > 4.13* 4.34* 4.38* 4.38* 4.42*  CALCIUM 8.8* 8.9  < > 8.8* 9.1 9.1 8.9 9.0  MG 2.5* 2.2  --  2.2  --  2.2  --  2.2  PHOS 2.0* 3.2  < > 4.3 4.6 5.5* 6.0* 5.6*  < > = values in this interval not displayed.  Liver Function Tests:  Recent Labs Lab 09/11/16 0341 09/11/16 1556 09/12/16 0358 09/12/16 1603 09/13/16 0521  ALBUMIN 2.8* 2.9*  2.8* 2.8* 3.0*   No results for input(s): LIPASE, AMYLASE in the last 168 hours. No results for input(s): AMMONIA in the last 168 hours.  CBC:  Recent Labs Lab 09/09/16 0402 09/10/16 0402 09/11/16 0341 09/12/16 0358 09/13/16 0521  WBC 6.7 7.2 6.9 5.6 4.9  HGB 10.3* 9.5* 9.6* 9.1* 9.1*  HCT 30.8* 28.5* 29.0* 26.6* 26.9*  MCV 83.2 82.8 82.9 81.8 81.8  PLT 173 176 177 186 202    Cardiac Enzymes: No results for input(s): CKTOTAL, CKMB, CKMBINDEX, TROPONINI in the last 168 hours.  BNP: BNP (last 3 results)  Recent Labs  09/28/15 1114  BNP 871.3*    ProBNP (last 3 results) No results for input(s): PROBNP in the last 8760 hours.    Other results:  Imaging: No results found.   Medications:     Scheduled Medications: . amiodarone  200 mg Oral Daily  . aspirin EC  81 mg Oral Daily  . Chlorhexidine Gluconate Cloth  6 each Topical Q0600  . insulin aspart  0-9 Units Subcutaneous TID WC  . insulin aspart  2 Units Subcutaneous TID WC  . insulin glargine   10 Units Subcutaneous Daily  . pravastatin  20 mg Oral q1800  . sildenafil  20 mg Oral TID  . sodium chloride flush  10-40 mL Intracatheter Q12H    Infusions: . cefTRIAXone (ROCEPHIN)  IV Stopped (09/12/16 1026)  . heparin 1,250 Units/hr (09/13/16 0000)  . milrinone 0.375 mcg/kg/min (09/13/16 0000)  . norepinephrine (LEVOPHED) Adult infusion 8 mcg/min (09/13/16 0000)  . sodium chloride      PRN Medications: acetaminophen, albuterol, benzonatate, chlorpheniramine-HYDROcodone, docusate sodium, heparin, hydrOXYzine, mineral oil-hydrophilic petrolatum, ondansetron (ZOFRAN) IV, sodium chloride, sodium chloride flush   Patient Profile:   Jessica [name] is a 56 year old with h/o chronic systolic heart failure, ICM, Biotronik ICD, and PAH admitted new onset AFL and cardiogenic shock (EF 10%). Developed AKI requiring CVVHD.  Assessment/Plan/Discussion:    1. A/C Systolic Biventricular Heart Failure -> cardiogenic shock:  - Ischemic cardiomyopathy.  Echo 08/24/16. LVEF 10% severe RV HK (EF down further in setting of AFL).   - Remains on milrinone 0.375 mcg/kg/min and Norepi 8 mcg. Coox 59.8% this am. CVP 13-14 - CVP borderline for goal of > 15 for IV lasix metolazone. Will discuss with MD.  May wait until this afternoon to repeat lasix/metolazone.   - If/when renal function returns will then need to work on weaning inotropes.  - No bb or ACE/ARB/ARNI with low output and AKI .   2. A flutter RVR:  - New onset June 11 with device interrogation. S/P TEE DC-CV 6/18 with conversion to NSR.  - Maintaining SR. Amio turned down to 200 daily several days ago due to severe bradycardia. HR now 50- 60s. Continue heparin drip. (Was on Eliquis at home)  - Hgb 10.3 -> 9.5 ->9.6-> 9.1 -> 9.1 - Discussed with Pharm D.   3. Acute on chronic renal failure (baseline CKD 3) due to cardio-renal syndrome.  - CVVHD started 6/20. Restarted 6/29. Held 09/09/16 with low volume status.   - As above renal function seems to  be recvoering some. Yet to be seen what new baseline will be.  - As above, awaiting CVP > 15 for lasix + metolazone. Possibly this afternoon so as not to overstress kidneys in face of RHF  4. PAH - Continue sildenafil 20 mg TID for now. No change.  .  5. CAD S/P multiple interventions.  - No s/s  ischemia  Continue 81 mg aspirin. Continue pravastatin.   - No change.   6. PAD:  -occluded left SFA with mild/mod abnormal ABIs. No revascularization was needed October 2017. No change.   7. H/O LV Thrombus: no thrombus on TEE. Remains on heparin for AFL. No change.   8. Hyponatremia:  - Serum sodium 121 this am. Continue free water restrict.   9. Deconditioning - PT following. Recommending HHPT.   No change.   10. ID - Tmax 99.2. UA positive. BCx NGTD - Rocephin started 7/3. UCx with insignificant growth.  11. DM2 - CBG had been running 200-250. Insulin adjusted 09/11/16. CBGs now running 140-180. - Covering with SSI.   Jessica Freer, PA-C  09/13/2016 7:27 AM  Advanced Heart Failure Team Pager [phone] (M-F; 7a - 4p)  Please contact [facility] for night-coverage after hours (4p -7a ) and weekends on [email or web address]   Patient seen and examined with the above-signed Advanced Practice Provider and/or Housestaff. I personally reviewed laboratory data, imaging studies and relevant notes. I independently examined the patient and formulated the important aspects of the plan. I have edited the note to reflect any of my changes or salient points. I have personally discussed the plan with the patient and/or family..  Remains on dual pressors. Co-ox 60%. Creatinine continues to plateau. No signs of uremia. CVP back up. Given 160IV lasix and metolazone 5mg  today with > 2L urine output.   Discussed with Dr. Arrie Aran. Renal function seems to be recovering but creatinine not trending down yet. Unclear what new baseline will be. Once creatinine nadirs will start weaning back inotropes to see  if her cardiac status can support her. Likely can get trialysis cath out soon. Continue Rocephin for UTI. Continue heparin for recent DC-CV of AFL.   Jessica Meres, MD  7:58 PM

## 2016-09-13 NOTE — Progress Notes (Signed)
Physical Therapy Treatment Patient Details Name: Jessica [name] MRN: [id number] DOB: 08/20/1960 Today's Date: 09/13/2016    History of Present Illness 56yof on eliquis for afib, s/p DCCV on 6/18, transitioned to IV heparin 6/20 anticipating CVVHD due to Acute oliguric kidney injury: secondary to decompensated HF.  CVVHD stopped 09/05/16.       PT Comments    Pt admitted with above diagnosis. Pt currently with functional limitations due to balance and endurance deficits. Pt ambulated with RW with min guard assist.  Not feeling as well today but still ambulated.   Tried to ambulate pt without O2 with sats in mid to high 80's.  Pt on 4L in room.  Replaced O2 at 4L.  Pt will benefit from skilled PT to increase their independence and safety with mobility to allow discharge to the venue listed below.     Follow Up Recommendations  Home health PT;Supervision/Assistance - 24 hour     Equipment Recommendations  None recommended by PT    Recommendations for Other Services       Precautions / Restrictions Precautions Precautions: Fall Restrictions Weight Bearing Restrictions: No    Mobility  Bed Mobility               General bed mobility comments: standing on arrival with nurse in room.   Transfers Overall transfer level: Needs assistance Equipment used: Rolling walker (2 wheeled) Transfers: Sit to/from Stand Sit to Stand: Min guard         General transfer comment: No assist needed to stand.  Cues given for hand placement. Pt stood from 3n1 and cleaned herself with min guard assist.    Ambulation/Gait Ambulation/Gait assistance: Min guard Ambulation Distance (Feet): 175 Feet Assistive device: Rolling walker (2 wheeled) Gait Pattern/deviations: Step-through pattern;Decreased stride length   Gait velocity interpretation: <1.8 ft/sec, indicative of risk for recurrent falls General Gait Details: Pt was able to ambulate with RW with cues for sequencing steps and RW.  Pt was  able to ambulate with steadying assist only at times.     Stairs            Wheelchair Mobility    Modified Rankin (Stroke Patients Only)       Balance Overall balance assessment: Needs assistance Sitting-balance support: No upper extremity supported;Feet supported Sitting balance-Leahy Scale: Good     Standing balance support: Bilateral upper extremity supported;During functional activity Standing balance-Leahy Scale: Fair Standing balance comment: can stand statically without UE support.                             Cognition Arousal/Alertness: Awake/alert Behavior During Therapy: WFL for tasks assessed/performed Overall Cognitive Status: Within Functional Limits for tasks assessed                                        Exercises      General Comments        Pertinent Vitals/Pain Pain Assessment: Faces Faces Pain Scale: Hurts a little bit Pain Location: chest pain Pain Descriptors / Indicators: Dull Pain Intervention(s): Limited activity within patient's tolerance;Monitored during session;Repositioned    Home Living                      Prior Function            PT Goals (current goals can  now be found in the care plan section) Progress towards PT goals: Progressing toward goals    Frequency    Min 3X/week      PT Plan Current plan remains appropriate    Co-evaluation              AM-PAC PT "6 Clicks" Daily Activity  Outcome Measure  Difficulty turning over in bed (including adjusting bedclothes, sheets and blankets)?: None Difficulty moving from lying on back to sitting on the side of the bed? : None Difficulty sitting down on and standing up from a chair with arms (e.g., wheelchair, bedside commode, etc,.)?: A Little Help needed moving to and from a bed to chair (including a wheelchair)?: A Little Help needed walking in hospital room?: A Little Help needed climbing 3-5 steps with a railing? : A  Little 6 Click Score: 20    End of Session Equipment Utilized During Treatment: Gait belt;Oxygen Activity Tolerance: Patient limited by fatigue Patient left: with call bell/phone within reach;in bed (sitting EOB to eat breakfast) Nurse Communication: Mobility status PT Visit Diagnosis: Unsteadiness on feet (R26.81);Muscle weakness (generalized) (M62.81)     Time: [id number] PT Time Calculation (min) (ACUTE ONLY): 23 min  Charges:  $Gait Training: 8-22 mins $Self Care/Home Management: 8-22                    G Codes:       Oluwatimileyin Vivier,PT Acute Rehabilitation [phone] (pager)    Berline Lopesawn F Carlis Blanchard 09/13/2016, 1:40 PM

## 2016-09-14 LAB — RENAL FUNCTION PANEL
ANION GAP: 11 (ref 5–15)
Albumin: 3 g/dL — ABNORMAL LOW (ref 3.5–5.0)
Albumin: 2.8 g/dL — ABNORMAL LOW (ref 3.5–5.0)
Anion gap: 11 (ref 5–15)
BUN: 67 mg/dL — ABNORMAL HIGH (ref 6–20)
BUN: 64 mg/dL — ABNORMAL HIGH (ref 6–20)
CALCIUM: 9.2 mg/dL (ref 8.9–10.3)
CHLORIDE: 89 mmol/L — AB (ref 101–111)
CO2: 21 mmol/L — ABNORMAL LOW (ref 22–32)
CO2: 21 mmol/L — AB (ref 22–32)
CREATININE: 4.4 mg/dL — AB (ref 0.44–1.00)
Calcium: 8.6 mg/dL — ABNORMAL LOW (ref 8.9–10.3)
Chloride: 91 mmol/L — ABNORMAL LOW (ref 101–111)
Creatinine, Ser: 3.95 mg/dL — ABNORMAL HIGH (ref 0.44–1.00)
GFR calc Af Amer: 13 mL/min — ABNORMAL LOW (ref >60)
GFR calc non Af Amer: 12 mL/min — ABNORMAL LOW (ref >60)
GFR, EST AFRICAN AMERICAN: 12 mL/min — AB (ref >60)
GFR, EST NON AFRICAN AMERICAN: 10 mL/min — AB (ref >60)
GLUCOSE: 240 mg/dL — AB (ref 65–99)
Glucose, Bld: 249 mg/dL — ABNORMAL HIGH (ref 65–99)
PHOSPHORUS: 6 mg/dL — AB (ref 2.5–4.6)
POTASSIUM: 3.8 mmol/L (ref 3.5–5.1)
Phosphorus: 5.5 mg/dL — ABNORMAL HIGH (ref 2.5–4.6)
Potassium: 4 mmol/L (ref 3.5–5.1)
Sodium: 121 mmol/L — ABNORMAL LOW (ref 135–145)
Sodium: 123 mmol/L — ABNORMAL LOW (ref 135–145)

## 2016-09-14 LAB — COOXEMETRY PANEL
Carboxyhemoglobin: 1.9 % — ABNORMAL HIGH (ref 0.5–1.5)
Methemoglobin: 1.2 % (ref 0.0–1.5)
O2 SAT: 57.5 %
TOTAL HEMOGLOBIN: 9.3 g/dL — AB (ref 12.0–16.0)

## 2016-09-14 LAB — CBC
HEMATOCRIT: 26.4 % — AB (ref 36.0–46.0)
Hemoglobin: 8.9 g/dL — ABNORMAL LOW (ref 12.0–15.0)
MCH: 27.7 pg (ref 26.0–34.0)
MCHC: 33.7 g/dL (ref 30.0–36.0)
MCV: 82.2 fL (ref 78.0–100.0)
Platelets: 181 10*3/uL (ref 150–400)
RBC: 3.21 MIL/uL — ABNORMAL LOW (ref 3.87–5.11)
RDW: 16.7 % — AB (ref 11.5–15.5)
WBC: 4.3 10*3/uL (ref 4.0–10.5)

## 2016-09-14 LAB — GLUCOSE, CAPILLARY
GLUCOSE-CAPILLARY: 153 mg/dL — AB (ref 65–99)
GLUCOSE-CAPILLARY: 145 mg/dL — AB (ref 65–99)
Glucose-Capillary: 172 mg/dL — ABNORMAL HIGH (ref 65–99)
Glucose-Capillary: 189 mg/dL — ABNORMAL HIGH (ref 65–99)

## 2016-09-14 LAB — HEPARIN LEVEL (UNFRACTIONATED): HEPARIN UNFRACTIONATED: 0.39 [IU]/mL (ref 0.30–0.70)

## 2016-09-14 LAB — MAGNESIUM: Magnesium: 2.2 mg/dL (ref 1.7–2.4)

## 2016-09-14 NOTE — Progress Notes (Signed)
[facility] Progress Note    Assessment/ Plan:   1. AKI/CKD stage 3 due to cardiorenal syndrome, oliguric. Started on CVVHD 6/20 and then has been off again on again, most recently from 6/29-09/09/16 with low cvp (has pulmonary HTN and needs higher filling pressures with goal cvp of 15-20). Holding CVVHD for now with marked increase in BUN/Cr. 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 1. Thankfully she was responding to IV diuretics and SCr has stabilized; last dose of Lasix on 09/13/16. 2. Continue to hold CVVHD and follow --> UOP adequate I&O /24hr 1808 - 4300 = -2491. 3. Would recommend removing HD cath and not restarting CVVHD as she is not a candidate for longterm dialysis and is currently pressor dependent (still on Milrinone and Levo ).  CVVHD is merely a temporizing measure and has helped somewhat but overall her prognosis remains poor.  4. Recommend palliative care consult to help set goals/limits of care 2. Acute on chronic biventricular heart failure/cardiogenic shock- on milrinone/norepi. Holding CVVHD for now until CVP reaches 15-20 (was 13-14this am) 1. Agree with continuing metolazone and IV lasix based on CVP and follow UOP, however I have reservations about resuming CVVHD as she remains pressor dependent and she is not able to tolerate IHD. Currently off Lasix. 3. A flutter with rvr- s/p DC-CV, on amio 4. Pulmonary HTN- on sildenafil 20mg  tid 5. Anemia of chronic disease- not sure if ESA is appropriate in her situation 6. Hyponatremia- due to chf, hopefully will stabilize in low 120's, already on fluid restriction and had been on diuretics. Will consider Tolvaptan -> d/w the heart failure team.  7. LV thrombus- on eliquis and heparin 8. Vascular access-  RIJ temporary catheter placed 09/05/16, will need to be replaced or removed in the next 1-2 days to prevent catheter-related bacteremia.  Subjective:   Feels mildly improved from  admission but still has some dyspnea. Denies f/c/n/v.   Objective:   BP 109/62 (BP Location: Left Arm)   Pulse (!) 53   Temp 98.2 F (36.8 C) (Oral)   Resp 15   Ht 5\' 5"  (1.651 m)   Wt 58.8 kg (129 lb 10.1 oz)   SpO2 96%   BMI 21.57 kg/m   Intake/Output Summary (Last 24 hours) at 09/14/16 0827 Last data filed at 09/14/16 0700  Gross per 24 hour  Intake          1582.25 ml  Output             4300 ml  Net         -2717.75 ml   Weight change: 0.9 kg (1 lb 15.8 oz)  Physical Exam: Gen:NAD, sitting up in bed CVS: bradycardic Resp:bibasilar crackles 1/3 up from base WUJ:WJXBJY Ext:tr edema  Imaging: No results found.  Labs: BMET  Recent Labs Lab 09/11/16 0341 09/11/16 1556 09/12/16 0358 09/12/16 1603 09/13/16 0521 09/13/16 1600 09/14/16 0451  NA 121* 120* 122* 122* 121* 123* 121*  K 5.0 5.1 4.6 4.6 4.8 4.5 4.0  CL 91* 89* 88* 89* 88* 91* 89*  CO2 20* 20* 22 20* 21* 22 21*  GLUCOSE 303* 271* 283* 208* 253* 136* 249*  BUN 40* 46* 53* 55* 61* 64* 67*  CREATININE 4.13* 4.34* 4.38* 4.38* 4.42* 4.30* 4.40*  CALCIUM 8.8* 9.1 9.1 8.9 9.0 9.2 9.2  PHOS 4.3 4.6 5.5* 6.0* 5.6* 6.2* 6.0*   CBC  Recent Labs Lab 09/11/16 0341 09/12/16 0358 09/13/16 7829  09/14/16 0451  WBC 6.9 5.6 4.9 4.3  HGB 9.6* 9.1* 9.1* 8.9*  HCT 29.0* 26.6* 26.9* 26.4*  MCV 82.9 81.8 81.8 82.2  PLT 177 186 202 181    Medications:    . amiodarone  200 mg Oral Daily  . aspirin EC  81 mg Oral Daily  . Chlorhexidine Gluconate Cloth  6 each Topical Q0600  . insulin aspart  0-9 Units Subcutaneous TID WC  . insulin aspart  2 Units Subcutaneous TID WC  . insulin glargine  10 Units Subcutaneous Daily  . pravastatin  20 mg Oral q1800  . sildenafil  20 mg Oral TID  . sodium chloride flush  10-40 mL Intracatheter Q12H      Paulene FloorJames Lin, MD 09/14/2016, 8:27 AM

## 2016-09-14 NOTE — Progress Notes (Signed)
Patient ID: Jessica [name], female   DOB: [birth year], 56 y.o.   MRN: [id number]   Advanced Heart Failure Rounding Note   Subjective:    Admitted with new onset atrial flutter and ADHF. Milrinone started 6/15 for worsening HF and renal failure.  S/P TEE and successful DC-CV.   Moved to ICU on 6/19 due to low output and worsening AKI. CVVHD catheter placed. CVVHD stopped 6/27. Resumed 6/29.  09/08/16 N  Coox 57.5% on milrinone 0.375 and Norepi 8. CVP 11-12  Feeling OK. Feet with some swelling this am. Denies SOB. Had great UO yesterday. Denies lightheadedness or dizziness.   Creatinine remains elevated but stable at 4.4. BUN slowly climbing. K 4.0 this am.  Pt had 2.5 L UO and negative 1.3 L.    Objective:   Weight Range:  Vital Signs:   Temp:  [97.9 F (36.6 C)-98.4 F (36.9 C)] 97.9 F (36.6 C) (07/06 0353) Pulse Rate:  [51-74] 53 (07/06 0700) Resp:  [13-24] 15 (07/06 0700) BP: (99-133)/(50-74) 109/62 (07/06 0700) SpO2:  [90 %-100 %] 96 % (07/06 0700) Weight:  [129 lb 10.1 oz (58.8 kg)] 129 lb 10.1 oz (58.8 kg) (07/06 0500) Last BM Date: 09/11/16  Weight change: Filed Weights   09/12/16 0500 09/13/16 0500 09/14/16 0500  Weight: 130 lb 9.6 oz (59.2 kg) 127 lb 10.3 oz (57.9 kg) 129 lb 10.1 oz (58.8 kg)    Intake/Output:   Intake/Output Summary (Last 24 hours) at 09/14/16 0715 Last data filed at 09/14/16 0700  Gross per 24 hour  Intake          1558.75 ml  Output             2900 ml  Net         -1341.25 ml     Physical Exam: CVP 10-11 General: Lying in bed. NAD HEENT: Normalx for poor dentition Neck: Supple. JVP elevated to jaw. Carotids 2+ bilat; no bruits. No thyromegaly or nodule noted. Cor: PMI nondisplaced. RRR, +S3 Lungs: Diminished basilar sounds. Abdomen: Soft, non-tender, non-distended, no HSM. No bruits or masses. +BS  Extremities: No cyanosis, clubbing, or rash. Trace to 1+ edema. Neuro: Alert & orientedx3, cranial nerves grossly intact. moves all 4  extremities w/o difficulty. Affect pleasant    Telemetry: Personally reviewed, NSR 50-60s  Labs: Basic Metabolic Panel:  Recent Labs Lab 09/10/16 0402  09/11/16 0341  09/12/16 0358 09/12/16 1603 09/13/16 0521 09/13/16 1600 09/14/16 0451  NA 124*  < > 121*  < > 122* 122* 121* 123* 121*  K 4.4  < > 5.0  < > 4.6 4.6 4.8 4.5 4.0  CL 92*  < > 91*  < > 88* 89* 88* 91* 89*  CO2 25  < > 20*  < > 22 20* 21* 22 21*  GLUCOSE 318*  < > 303*  < > 283* 208* 253* 136* 249*  BUN 25*  < > 40*  < > 53* 55* 61* 64* 67*  CREATININE 3.18*  < > 4.13*  < > 4.38* 4.38* 4.42* 4.30* 4.40*  CALCIUM 8.9  < > 8.8*  < > 9.1 8.9 9.0 9.2 9.2  MG 2.2  --  2.2  --  2.2  --  2.2  --  2.2  PHOS 3.2  < > 4.3  < > 5.5* 6.0* 5.6* 6.2* 6.0*  < > = values in this interval not displayed.  Liver Function Tests:  Recent Labs Lab 09/12/16 0358 09/12/16 1603 09/13/16 0521 09/13/16  1600 09/14/16 0451  ALBUMIN 2.8* 2.8* 3.0* 3.1* 3.0*   No results for input(s): LIPASE, AMYLASE in the last 168 hours. No results for input(s): AMMONIA in the last 168 hours.  CBC:  Recent Labs Lab 09/10/16 0402 09/11/16 0341 09/12/16 0358 09/13/16 0521 09/14/16 0451  WBC 7.2 6.9 5.6 4.9 4.3  HGB 9.5* 9.6* 9.1* 9.1* 8.9*  HCT 28.5* 29.0* 26.6* 26.9* 26.4*  MCV 82.8 82.9 81.8 81.8 82.2  PLT 176 177 186 202 181    Cardiac Enzymes: No results for input(s): CKTOTAL, CKMB, CKMBINDEX, TROPONINI in the last 168 hours.  BNP: BNP (last 3 results)  Recent Labs  09/28/15 1114  BNP 871.3*    ProBNP (last 3 results) No results for input(s): PROBNP in the last 8760 hours.    Other results:  Imaging: No results found.   Medications:     Scheduled Medications: . amiodarone  200 mg Oral Daily  . aspirin EC  81 mg Oral Daily  . Chlorhexidine Gluconate Cloth  6 each Topical Q0600  . insulin aspart  0-9 Units Subcutaneous TID WC  . insulin aspart  2 Units Subcutaneous TID WC  . insulin glargine  10 Units  Subcutaneous Daily  . pravastatin  20 mg Oral q1800  . sildenafil  20 mg Oral TID  . sodium chloride flush  10-40 mL Intracatheter Q12H    Infusions: . cefTRIAXone (ROCEPHIN)  IV Stopped (09/13/16 0940)  . heparin 1,250 Units/hr (09/13/16 2146)  . milrinone 0.375 mcg/kg/min (09/14/16 [id number])  . norepinephrine (LEVOPHED) Adult infusion 8 mcg/min (09/13/16 2000)  . sodium chloride      PRN Medications: acetaminophen, albuterol, benzonatate, chlorpheniramine-HYDROcodone, docusate sodium, heparin, hydrOXYzine, mineral oil-hydrophilic petrolatum, ondansetron (ZOFRAN) IV, sodium chloride, sodium chloride flush   Patient Profile:   Jessica [name] is a 56 year old with h/o chronic systolic heart failure, ICM, Biotronik ICD, and PAH admitted new onset AFL and cardiogenic shock (EF 10%). Developed AKI requiring CVVHD.  Assessment/Plan/Discussion:    1. A/C Systolic Biventricular Heart Failure -> cardiogenic shock:  - Ischemic cardiomyopathy.  Echo 08/24/16. LVEF 10% severe RV HK (EF down further in setting of AFL).   - Remains on milrinone 0.375 mcg/kg/min and Norepi 8 mcg. Coox 57.5% this am. CVP 10-11 this am.  No diuretics today.  - If/when renal function returns will then need to work on weaning inotropes.  - No bb or ACE/ARB/ARNI with low output and AKI .  - Poor prognosis overall.  2. A flutter RVR:  - New onset June 11 with device interrogation. S/P TEE DC-CV 6/18 with conversion to NSR.  - Remains in NSR on amio 200 mg daily. HR stable in 50-60s  - Continue heparin drip. (Was on Eliquis at home)  - Hgb 10.3 -> 9.5 ->9.6-> 9.1 -> 9.1-> 8.9 - Discussed with Pharm-D.   3. Acute on chronic renal failure (baseline CKD 3) due to cardio-renal syndrome.  - CVVHD started 6/20. Restarted 6/29. Held 09/09/16 with low volume status.   - Renal function plateau in 4.2 - 4.4 range. Awaiting any improvement.  - CVP 10-11 as above. Will give rest day from diuretics and let CVP trend back up.   4.  PAH - Continue sildenafil 20 mg TID for now. No change.  .  5. CAD S/P multiple interventions.  - No s/s ischemia  Continue 81 mg aspirin. Continue pravastatin.   - No change  6. PAD:  -occluded left SFA with mild/mod abnormal ABIs. No revascularization was  needed October 2017. No change.    7. H/O LV Thrombus: no thrombus on TEE. Remains on heparin for AFL. No change.   8. Hyponatremia:  - Serum sodium 121 this am.  - Continue free water restriction.  - Per renal OK to use Tolvaptan. Will discuss with Dr. Gala Romney   9. Deconditioning - PT following. Recommending HHPT.   No change.   10. ID - Tmax 98.8. UA positive. BCx remain negative to date.  - Rocephin started 7/3. UCx with insignificant growth. No change.   11. DM2 - CBG had been running 200-250. Insulin adjusted 09/11/16. CBGs now running 140-180. - Covering with SSI. Appreciate input of diabetes coordinator.   Graciella Freer, PA-C  09/14/2016 7:15 AM  Advanced Heart Failure Team Pager [phone] (M-F; 7a - 4p)  Please contact [facility] for night-coverage after hours (4p -7a ) and weekends on [email or web address]   Patient seen and examined with the above-signed Advanced Practice Provider and/or Housestaff. I personally reviewed laboratory data, imaging studies and relevant notes. I independently examined the patient and formulated the important aspects of the plan. I have edited the note to reflect any of my changes or salient points. I have personally discussed the plan with the patient and/or family.  Good urine output with IV lasix and metolazone yesterday but BUN and CR not improving. Co-ox marginal on 2 inotropes. Will hold diuretics today. Continue inotropes. Hyponatremia is asymptomatic will not give tolvaptan today. Discussed with Renal. Will continue to wait to see what new creatinine baseline is. Will need to start weaning inotropes in near future. Overall remains very tenuous.   Arvilla Meres, MD  9:00  AM

## 2016-09-14 NOTE — Progress Notes (Signed)
ANTICOAGULATION CONSULT NOTE - Follow Up Consult  Pharmacy Consult for Heparin Indication: atrial fibrillation  Allergies  Allergen Reactions  . Nyquil Multi-Symptom [Pseudoeph-Doxylamine-Dm-Apap] Other (See Comments)    Fatigue, chest pressure and pain Pt tolerates Coricidin cough syrup at home  . Sudafed [Pseudoephedrine] Other (See Comments)    Fatigue, chest pressure and pain  . Lasix [Furosemide]     Rash  . Torsemide     Rash  . Zocor [Simvastatin]     Myalgias   . Latex Rash  . Ramipril Cough    Patient Measurements: Height: 5\' 5"  (165.1 cm) Weight: 129 lb 10.1 oz (58.8 kg) IBW/kg (Calculated) : 57  Vital Signs: Temp: 98.2 F (36.8 C) (07/06 0700) Temp Source: Oral (07/06 0700) BP: 109/62 (07/06 0700) Pulse Rate: 53 (07/06 0700)  Labs:  Recent Labs  09/12/16 0358  09/13/16 0521 09/13/16 1600 09/14/16 0450 09/14/16 0451  HGB 9.1*  --  9.1*  --   --  8.9*  HCT 26.6*  --  26.9*  --   --  26.4*  PLT 186  --  202  --   --  181  HEPARINUNFRC 0.39  --  0.50  --  0.39  --   CREATININE 4.38*  < > 4.42* 4.30*  --  4.40*  < > = values in this interval not displayed.  Estimated Creatinine Clearance: 12.5 mL/min (A) (by C-G formula based on SCr of 4.4 mg/dL (H)).  Medications: Heparin @ 1250 units/hr  Assessment: 56yof on eliquis for afib, s/p DCCV on 6/18, transitioned to IV heparin 6/20 anticipating CVVHD. CVVHD stopped 6/27, temp IJ catheter placed 6/28, heparin resumed, and CVVHD resumed 6/29. CRRT stopped again 7/1.   Heparin level is therapeutic at 0.39. CBC stable. No bleeding noted.  Goal of Therapy:  Heparin level 0.3-0.7 units/ml Monitor platelets by anticoagulation protocol: Yes   Plan:  1) Continue heparin at 1250 units/hr 2) Daily heparin level and CBC 3) Follow up plan for oral anticoagulation eventually.  Jessica CasaJennifer Denay [name], PharmD, BCPS  09/14/2016 10:03 AM

## 2016-09-15 LAB — RENAL FUNCTION PANEL
ALBUMIN: 3 g/dL — AB (ref 3.5–5.0)
ANION GAP: 13 (ref 5–15)
Albumin: 2.9 g/dL — ABNORMAL LOW (ref 3.5–5.0)
Anion gap: 10 (ref 5–15)
BUN: 69 mg/dL — AB (ref 6–20)
BUN: 66 mg/dL — ABNORMAL HIGH (ref 6–20)
CALCIUM: 9.3 mg/dL (ref 8.9–10.3)
CHLORIDE: 91 mmol/L — AB (ref 101–111)
CO2: 22 mmol/L (ref 22–32)
CO2: 21 mmol/L — ABNORMAL LOW (ref 22–32)
CREATININE: 4.04 mg/dL — AB (ref 0.44–1.00)
Calcium: 9.2 mg/dL (ref 8.9–10.3)
Chloride: 92 mmol/L — ABNORMAL LOW (ref 101–111)
Creatinine, Ser: 3.87 mg/dL — ABNORMAL HIGH (ref 0.44–1.00)
GFR calc Af Amer: 13 mL/min — ABNORMAL LOW (ref >60)
GFR calc non Af Amer: 11 mL/min — ABNORMAL LOW (ref >60)
GFR calc non Af Amer: 12 mL/min — ABNORMAL LOW (ref >60)
GFR, EST AFRICAN AMERICAN: 14 mL/min — AB (ref >60)
GLUCOSE: 152 mg/dL — AB (ref 65–99)
Glucose, Bld: 157 mg/dL — ABNORMAL HIGH (ref 65–99)
PHOSPHORUS: 5.8 mg/dL — AB (ref 2.5–4.6)
POTASSIUM: 4.2 mmol/L (ref 3.5–5.1)
Phosphorus: 5.6 mg/dL — ABNORMAL HIGH (ref 2.5–4.6)
Potassium: 4.3 mmol/L (ref 3.5–5.1)
SODIUM: 124 mmol/L — AB (ref 135–145)
Sodium: 125 mmol/L — ABNORMAL LOW (ref 135–145)

## 2016-09-15 LAB — CBC
HEMATOCRIT: 25.9 % — AB (ref 36.0–46.0)
Hemoglobin: 8.8 g/dL — ABNORMAL LOW (ref 12.0–15.0)
MCH: 27.5 pg (ref 26.0–34.0)
MCHC: 34 g/dL (ref 30.0–36.0)
MCV: 80.9 fL (ref 78.0–100.0)
PLATELETS: 175 10*3/uL (ref 150–400)
RBC: 3.2 MIL/uL — ABNORMAL LOW (ref 3.87–5.11)
RDW: 16.4 % — AB (ref 11.5–15.5)
WBC: 4.9 10*3/uL (ref 4.0–10.5)

## 2016-09-15 LAB — CULTURE, BLOOD (ROUTINE X 2)
CULTURE: NO GROWTH
Culture: NO GROWTH
SPECIAL REQUESTS: ADEQUATE
SPECIAL REQUESTS: ADEQUATE

## 2016-09-15 LAB — COOXEMETRY PANEL
Carboxyhemoglobin: 2.2 % — ABNORMAL HIGH (ref 0.5–1.5)
Methemoglobin: 1.3 % (ref 0.0–1.5)
O2 SAT: 68.7 %
Total hemoglobin: 9.1 g/dL — ABNORMAL LOW (ref 12.0–16.0)

## 2016-09-15 LAB — GLUCOSE, CAPILLARY
GLUCOSE-CAPILLARY: 169 mg/dL — AB (ref 65–99)
Glucose-Capillary: 140 mg/dL — ABNORMAL HIGH (ref 65–99)

## 2016-09-15 LAB — HEPARIN LEVEL (UNFRACTIONATED): HEPARIN UNFRACTIONATED: 0.31 [IU]/mL (ref 0.30–0.70)

## 2016-09-15 NOTE — Progress Notes (Signed)
[facility] Progress Note    Assessment/ Plan:   1. AKI/CKD stage 3 due to cardiorenal syndrome, oliguric. Started on CVVHD 6/20 and then has been off again on again, most recently from 6/29-09/09/16 with low cvp (has pulmonary HTN and needs higher filling pressures with goal cvp of 15-20). Holding CVVHD for now with marked increase in BUN/Cr. 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 1. Thankfully she was responding to IV diuretics and SCr has stabilized; last dose of Lasix on 09/13/16. 2. Continue to hold CVVHD and follow --> UOP adequate I&O /24hr 1808 - 4300 = -2491. 3. Would recommend removing HD cath and not restarting CVVHD as she is not a candidate for longterm dialysis and is currently pressor dependent (still on Milrinone and Levo ). CVVHD is merely a temporizing measure and has helped somewhat but overall her prognosis remains poor.  4. Recommend palliative care consult to help set goals/limits of care 2. Acute on chronic biventricular heart failure/cardiogenic shock- on milrinone/norepi. Holding CVVHD for now until CVP reaches 15-20 (was 13-14this am) 1. Agree with continuing metolazone and IV lasix based on CVP and follow UOP, however I have reservations about resuming CVVHD as she remains pressor dependent and she is not able to tolerate IHD. Currently off Lasix. 3. A flutter with rvr- s/p DC-CV, on amio 4. Pulmonary HTN- on sildenafil 20mg  tid 5. Anemia of chronic disease- not sure if ESA is appropriate in her situation 6. Hyponatremia- due to chf, hopefully will stabilize in low 120's, already on fluid restriction and had been on diuretics. If SNa drops then may consider Tolvaptan -> d/w the heart failure team.  - SNa actually incr from 121 -> 124 so no pressing need to start.  -> I&O [phone] = net neg 1140 /24hrs 7. LV thrombus- on eliquis and heparin 8. Vascular access- RIJ temporary catheter placed 09/05/16, will need to be replaced  or removed in the next 1-2 days to prevent catheter-related bacteremia.  Subjective:   Breathing same, as long as bed is  at  30 degree she feels more comfortable. Denies CP, palpitations. No foley.   Objective:   BP 123/67   Pulse (!) 58   Temp 98.7 F (37.1 C) (Oral)   Resp 20   Ht 5\' 5"  (1.651 m)   Wt 58.9 kg (129 lb 12.8 oz)   SpO2 92%   BMI 21.60 kg/m   Intake/Output Summary (Last 24 hours) at 09/15/16 0749 Last data filed at 09/15/16 0600  Gross per 24 hour  Intake           659.51 ml  Output             1800 ml  Net         -1140.49 ml   Weight change: 0.077 kg (2.7 oz)  Physical Exam: Gen:NAD, sitting up in bed CVS: bradycardic Resp:bibasilar crackles 1/3 up from base ZOX:WRUEAV Ext:tr edema  Imaging: No results found.  Labs: BMET  Recent Labs Lab 09/12/16 0358 09/12/16 1603 09/13/16 0521 09/13/16 1600 09/14/16 0451 09/14/16 1657 09/15/16 0513  NA 122* 122* 121* 123* 121* 123* 124*  K 4.6 4.6 4.8 4.5 4.0 3.8 4.2  CL 88* 89* 88* 91* 89* 91* 92*  CO2 22 20* 21* 22 21* 21* 22  GLUCOSE 283* 208* 253* 136* 249* 240* 152*  BUN 53* 55* 61* 64* 67* 64* 69*  CREATININE 4.38* 4.38* 4.42* 4.30* 4.40* 3.95* 4.04*  CALCIUM  9.1 8.9 9.0 9.2 9.2 8.6* 9.3  PHOS 5.5* 6.0* 5.6* 6.2* 6.0* 5.5* 5.8*   CBC  Recent Labs Lab 09/12/16 0358 09/13/16 0521 09/14/16 0451 09/15/16 0513  WBC 5.6 4.9 4.3 4.9  HGB 9.1* 9.1* 8.9* 8.8*  HCT 26.6* 26.9* 26.4* 25.9*  MCV 81.8 81.8 82.2 80.9  PLT 186 202 181 175    Medications:    . amiodarone  200 mg Oral Daily  . aspirin EC  81 mg Oral Daily  . Chlorhexidine Gluconate Cloth  6 each Topical Q0600  . insulin aspart  0-9 Units Subcutaneous TID WC  . insulin aspart  2 Units Subcutaneous TID WC  . insulin glargine  10 Units Subcutaneous Daily  . pravastatin  20 mg Oral q1800  . sildenafil  20 mg Oral TID  . sodium chloride flush  10-40 mL Intracatheter Q12H      Paulene FloorJames Lin, MD 09/15/2016, 7:49 AM

## 2016-09-15 NOTE — Progress Notes (Signed)
ANTICOAGULATION CONSULT NOTE - Follow Up Consult  Pharmacy Consult for Heparin Indication: atrial fibrillation  Allergies  Allergen Reactions  . Nyquil Multi-Symptom [Pseudoeph-Doxylamine-Dm-Apap] Other (See Comments)    Fatigue, chest pressure and pain Pt tolerates Coricidin cough syrup at home  . Sudafed [Pseudoephedrine] Other (See Comments)    Fatigue, chest pressure and pain  . Lasix [Furosemide]     Rash  . Torsemide     Rash  . Zocor [Simvastatin]     Myalgias   . Latex Rash  . Ramipril Cough    Patient Measurements: Height: 5\' 5"  (165.1 cm) Weight: 129 lb 12.8 oz (58.9 kg) IBW/kg (Calculated) : 57  Vital Signs: Temp: 98.5 F (36.9 C) (07/07 0700) Temp Source: Oral (07/07 0700) BP: 123/67 (07/07 0600) Pulse Rate: 58 (07/07 0600)  Labs:  Recent Labs  09/13/16 0521  09/14/16 0450 09/14/16 0451 09/14/16 1657 09/15/16 0513 09/15/16 0514  HGB 9.1*  --   --  8.9*  --  8.8*  --   HCT 26.9*  --   --  26.4*  --  25.9*  --   PLT 202  --   --  181  --  175  --   HEPARINUNFRC 0.50  --  0.39  --   --   --  0.31  CREATININE 4.42*  < >  --  4.40* 3.95* 4.04*  --   < > = values in this interval not displayed.  Estimated Creatinine Clearance: 13.7 mL/min (A) (by C-G formula based on SCr of 4.04 mg/dL (H)).  Medications: Heparin @ 1250 units/hr  Assessment: Jessica [name] on eliquis for afib, s/p DCCV on 6/18, transitioned to IV heparin 6/20 anticipating CVVHD. CVVHD stopped 6/27, temp IJ catheter placed 6/28, heparin resumed, and CVVHD resumed 6/29. CRRT stopped again 7/1.   Heparin level continues to be therapeutic this morning at 0.31. CBC stable. No bleeding noted.  Goal of Therapy:  Heparin level 0.3-0.7 units/ml Monitor platelets by anticoagulation protocol: Yes   Plan:  1) Increase heparin to 1300 units/hr 2) Daily heparin level and CBC 3) Follow up plan for oral anticoagulation eventually.  Sheppard CoilFrank Linnell Swords PharmD., BCPS Clinical Pharmacist Pager  [phone] 09/15/2016 9:18 AM

## 2016-09-15 NOTE — Progress Notes (Signed)
Patient ID: Jessica MoccasinSherita [name], female   DOB: Sep 12, 1960, 56 y.o.   MRN: [id number]   Advanced Heart Failure Rounding Note   Subjective:    Admitted with new onset atrial flutter and ADHF. Milrinone started 6/15 for worsening HF and renal failure.  S/P TEE and successful DC-CV.   Moved to ICU on 6/19 due to low output and worsening AKI. CVVHD catheter placed. CVVHD stopped 6/27. Resumed 6/29.  09/08/16 N  Feels back to baseline. No SOB or palpitations. Remains on milrinone and norepi 8. Co-ox 69%   Creatinine dropped down to 3.95 yesterday. Back to 4.04 today. CVP 10-11. Made 1800 cc urine without lasix  Objective:   Weight Range:  Vital Signs:   Temp:  [98 F (36.7 C)-98.8 F (37.1 C)] 98.7 F (37.1 C) (07/07 0400) Pulse Rate:  [56-63] 58 (07/07 0600) Resp:  [15-38] 20 (07/07 0600) BP: (109-123)/(54-68) 123/67 (07/07 0600) SpO2:  [88 %-97 %] 92 % (07/07 0600) Weight:  [58.9 kg (129 lb 12.8 oz)] 58.9 kg (129 lb 12.8 oz) (07/07 0344) Last BM Date: 09/15/16  Weight change: Filed Weights   09/13/16 0500 09/14/16 0500 09/15/16 0344  Weight: 57.9 kg (127 lb 10.3 oz) 58.8 kg (129 lb 10.1 oz) 58.9 kg (129 lb 12.8 oz)    Intake/Output:   Intake/Output Summary (Last 24 hours) at 09/15/16 0709 Last data filed at 09/15/16 0600  Gross per 24 hour  Intake           580.01 ml  Output             1800 ml  Net         -1219.99 ml     Physical Exam: CVP 10-11 General:  Sitting on side of bed No resp difficulty HEENT: normal Neck: supple. JVP to jaw RIJ dialysis cath Carotids 2+ bilat; no bruits. No lymphadenopathy or thryomegaly appreciated. Cor: PMI nondisplaced. Regular rate & rhythm. +s3 Lungs: clear Abdomen: soft, nontender, nondistended. No hepatosplenomegaly. No bruits or masses. Good bowel sounds. Extremities: no cyanosis, clubbing, rash, 1+ ankle edema Neuro: alert & orientedx3, cranial nerves grossly intact. moves all 4 extremities w/o difficulty. Affect pleasant      Telemetry: Personally reviewed, NSR 50-60s Labs: Basic Metabolic Panel:  Recent Labs Lab 09/10/16 0402  09/11/16 0341  09/12/16 0358  09/13/16 0521 09/13/16 1600 09/14/16 0451 09/14/16 1657 09/15/16 0513  NA 124*  < > 121*  < > 122*  < > 121* 123* 121* 123* 124*  K 4.4  < > 5.0  < > 4.6  < > 4.8 4.5 4.0 3.8 4.2  CL 92*  < > 91*  < > 88*  < > 88* 91* 89* 91* 92*  CO2 25  < > 20*  < > 22  < > 21* 22 21* 21* 22  GLUCOSE 318*  < > 303*  < > 283*  < > 253* 136* 249* 240* 152*  BUN 25*  < > 40*  < > 53*  < > 61* 64* 67* 64* 69*  CREATININE 3.18*  < > 4.13*  < > 4.38*  < > 4.42* 4.30* 4.40* 3.95* 4.04*  CALCIUM 8.9  < > 8.8*  < > 9.1  < > 9.0 9.2 9.2 8.6* 9.3  MG 2.2  --  2.2  --  2.2  --  2.2  --  2.2  --   --   PHOS 3.2  < > 4.3  < > 5.5*  < > 5.6*  6.2* 6.0* 5.5* 5.8*  < > = values in this interval not displayed.  Liver Function Tests:  Recent Labs Lab 09/13/16 0521 09/13/16 1600 09/14/16 0451 09/14/16 1657 09/15/16 0513  ALBUMIN 3.0* 3.1* 3.0* 2.8* 3.0*   No results for input(s): LIPASE, AMYLASE in the last 168 hours. No results for input(s): AMMONIA in the last 168 hours.  CBC:  Recent Labs Lab 09/11/16 0341 09/12/16 0358 09/13/16 0521 09/14/16 0451 09/15/16 0513  WBC 6.9 5.6 4.9 4.3 4.9  HGB 9.6* 9.1* 9.1* 8.9* 8.8*  HCT 29.0* 26.6* 26.9* 26.4* 25.9*  MCV 82.9 81.8 81.8 82.2 80.9  PLT 177 186 202 181 175    Cardiac Enzymes: No results for input(s): CKTOTAL, CKMB, CKMBINDEX, TROPONINI in the last 168 hours.  BNP: BNP (last 3 results)  Recent Labs  09/28/15 1114  BNP 871.3*    ProBNP (last 3 results) No results for input(s): PROBNP in the last 8760 hours.    Other results:  Imaging: No results found.   Medications:     Scheduled Medications: . amiodarone  200 mg Oral Daily  . aspirin EC  81 mg Oral Daily  . Chlorhexidine Gluconate Cloth  6 each Topical Q0600  . insulin aspart  0-9 Units Subcutaneous TID WC  . insulin aspart  2 Units  Subcutaneous TID WC  . insulin glargine  10 Units Subcutaneous Daily  . pravastatin  20 mg Oral q1800  . sildenafil  20 mg Oral TID  . sodium chloride flush  10-40 mL Intracatheter Q12H    Infusions: . cefTRIAXone (ROCEPHIN)  IV Stopped (09/14/16 1002)  . heparin 1,250 Units/hr (09/14/16 2000)  . milrinone 0.375 mcg/kg/min (09/14/16 2117)  . norepinephrine (LEVOPHED) Adult infusion 8 mcg/min (09/14/16 2000)  . sodium chloride      PRN Medications: acetaminophen, albuterol, benzonatate, chlorpheniramine-HYDROcodone, docusate sodium, heparin, hydrOXYzine, mineral oil-hydrophilic petrolatum, ondansetron (ZOFRAN) IV, sodium chloride, sodium chloride flush   Patient Profile:   Jessica [name] is a 56 year old with h/o chronic systolic heart failure, ICM, Biotronik ICD, and PAH admitted new onset AFL and cardiogenic shock (EF 10%). Developed AKI requiring CVVHD.  Assessment/Plan/Discussion:    1. A/C Systolic Biventricular Heart Failure -> cardiogenic shock:  - Ischemic cardiomyopathy.  Echo 08/24/16. LVEF 10% severe RV HK (EF down further in setting of AFL).   - Remains on milrinone 0.375 mcg/kg/min and Norepi 8 mcg. Coox up to 69% today. CVP remains 10-11 No diuretics today.  - Can wean norepi slowly. Keep SBP > 110 to maintain renal perfusio - No bb or ACE/ARB/ARNI with low output and AKI .  - Poor prognosis overall.  2. A flutter RVR:  - New onset June 11 with device interrogation. S/P TEE DC-CV 6/18 with conversion to NSR.  - Remains in NSR on amio 200 mg daily. HR stable in 50-60s  - Continue heparin drip. (Was on Eliquis at home)  - Hgb 10.3 -> 9.5 ->9.6-> 9.1 -> 9.1-> 8.9-> 8.9 - Discussed with PharmD  3. Acute on chronic renal failure (baseline CKD 3) due to cardio-renal syndrome.  - CVVHD started 6/20. Restarted 6/29. Held 09/09/16 with low volume status.   - Renal function starting to improve. Almost 2L urine out with no lasix. Creatinine coming down slowly.  - CVP 10-11 as  above. Will hold diuretics unless CVP 15 or greater. Can likely remove dialysis cath tomorrow  - Keep SBP > 110  4. PAH - Continue sildenafil 20 mg TID for now. No change.  [name]  5. CAD S/P multiple interventions.  - No s/s ischemia Continue 81 mg aspirin. Continue pravastatin.   - No change  6. PAD:  -occluded left SFA with mild/mod abnormal ABIs. No revascularization was needed October 2017. No change.    7. H/O LV Thrombus: no thrombus on TEE. Remains on heparin for AFL. No change.   8. Hyponatremia:  - Serum sodium 121 this am.  - Continue free water restriction.  - Per renal OK to use Tolvaptan. Will discuss with Dr. Gala Romney   9. Deconditioning - PT following. Recommending HHPT.   No change.   10. ID/UTI - Rocephin started 7/3. UCx with insignificant growth. No change.   11. DM2 - CBG had been running 200-250. Insulin adjusted 09/11/16. CBGs now running 140-190. - Covering with SSI. Appreciate input of diabetes coordinator.   Arvilla Meres, MD  09/15/2016 7:09 AM  Advanced Heart Failure Team Pager [phone] (M-F; 7a - 4p)  Please contact [facility] for night-coverage after hours (4p -7a ) and weekends on [email or web address]   Patient seen and examined with the above-signed Advanced Practice Provider and/or Housestaff. I personally reviewed laboratory data, imaging studies and relevant notes. I independently examined the patient and formulated the important aspects of the plan. I have edited the note to reflect any of my changes or salient points. I have personally discussed the plan with the patient and/or family.  Good urine output with IV lasix and metolazone yesterday but BUN and CR not improving. Co-ox marginal on 2 inotropes. Will hold diuretics today. Continue inotropes. Hyponatremia is asymptomatic will not give tolvaptan today. Discussed with Renal. Will continue to wait to see what new creatinine baseline is. Will need to start weaning inotropes in near future. Overall  remains very tenuous.   Arvilla Meres, MD  7:09 AM

## 2016-09-16 LAB — RENAL FUNCTION PANEL
ALBUMIN: 3 g/dL — AB (ref 3.5–5.0)
Albumin: 3 g/dL — ABNORMAL LOW (ref 3.5–5.0)
Anion gap: 10 (ref 5–15)
Anion gap: 9 (ref 5–15)
BUN: 70 mg/dL — ABNORMAL HIGH (ref 6–20)
BUN: 68 mg/dL — ABNORMAL HIGH (ref 6–20)
CALCIUM: 9.1 mg/dL (ref 8.9–10.3)
CHLORIDE: 91 mmol/L — AB (ref 101–111)
CO2: 20 mmol/L — ABNORMAL LOW (ref 22–32)
CO2: 21 mmol/L — AB (ref 22–32)
CREATININE: 3.82 mg/dL — AB (ref 0.44–1.00)
Calcium: 9.2 mg/dL (ref 8.9–10.3)
Chloride: 92 mmol/L — ABNORMAL LOW (ref 101–111)
Creatinine, Ser: 3.84 mg/dL — ABNORMAL HIGH (ref 0.44–1.00)
GFR calc Af Amer: 14 mL/min — ABNORMAL LOW (ref >60)
GFR calc Af Amer: 14 mL/min — ABNORMAL LOW (ref >60)
GFR calc non Af Amer: 12 mL/min — ABNORMAL LOW (ref >60)
GFR, EST NON AFRICAN AMERICAN: 12 mL/min — AB (ref >60)
GLUCOSE: 136 mg/dL — AB (ref 65–99)
GLUCOSE: 183 mg/dL — AB (ref 65–99)
PHOSPHORUS: 5.4 mg/dL — AB (ref 2.5–4.6)
POTASSIUM: 4.5 mmol/L (ref 3.5–5.1)
Phosphorus: 5.5 mg/dL — ABNORMAL HIGH (ref 2.5–4.6)
Potassium: 4.6 mmol/L (ref 3.5–5.1)
SODIUM: 121 mmol/L — AB (ref 135–145)
Sodium: 122 mmol/L — ABNORMAL LOW (ref 135–145)

## 2016-09-16 LAB — COOXEMETRY PANEL
CARBOXYHEMOGLOBIN: 1.9 % — AB (ref 0.5–1.5)
METHEMOGLOBIN: 1.3 % (ref 0.0–1.5)
O2 SAT: 54.7 %
Total hemoglobin: 8.7 g/dL — ABNORMAL LOW (ref 12.0–16.0)

## 2016-09-16 LAB — CBC
HEMATOCRIT: 25.3 % — AB (ref 36.0–46.0)
HEMOGLOBIN: 8.6 g/dL — AB (ref 12.0–15.0)
MCH: 27.7 pg (ref 26.0–34.0)
MCHC: 34 g/dL (ref 30.0–36.0)
MCV: 81.6 fL (ref 78.0–100.0)
Platelets: 171 10*3/uL (ref 150–400)
RBC: 3.1 MIL/uL — AB (ref 3.87–5.11)
RDW: 16.5 % — ABNORMAL HIGH (ref 11.5–15.5)
WBC: 5.9 10*3/uL (ref 4.0–10.5)

## 2016-09-16 LAB — GLUCOSE, CAPILLARY
GLUCOSE-CAPILLARY: 176 mg/dL — AB (ref 65–99)
GLUCOSE-CAPILLARY: 144 mg/dL — AB (ref 65–99)
Glucose-Capillary: 150 mg/dL — ABNORMAL HIGH (ref 65–99)

## 2016-09-16 LAB — HEPARIN LEVEL (UNFRACTIONATED): HEPARIN UNFRACTIONATED: 0.42 [IU]/mL (ref 0.30–0.70)

## 2016-09-16 NOTE — Progress Notes (Signed)
ANTICOAGULATION CONSULT NOTE - Follow Up Consult  Pharmacy Consult for Heparin Indication: atrial fibrillation  Allergies  Allergen Reactions  . Nyquil Multi-Symptom [Pseudoeph-Doxylamine-Dm-Apap] Other (See Comments)    Fatigue, chest pressure and pain Pt tolerates Coricidin cough syrup at home  . Sudafed [Pseudoephedrine] Other (See Comments)    Fatigue, chest pressure and pain  . Lasix [Furosemide]     Rash  . Torsemide     Rash  . Zocor [Simvastatin]     Myalgias   . Latex Rash  . Ramipril Cough    Patient Measurements: Height: 5\' 5"  (165.1 cm) Weight: 126 lb 12.2 oz (57.5 kg) IBW/kg (Calculated) : 57  Vital Signs: Temp: 99.3 F (37.4 C) (07/08 0700) Temp Source: Oral (07/08 0700) BP: 110/63 (07/08 0700) Pulse Rate: 60 (07/08 0700)  Labs:  Recent Labs  09/14/16 0450  09/14/16 0451  09/15/16 0513 09/15/16 0514 09/15/16 1654 09/16/16 0456  HGB  --   < > 8.9*  --  8.8*  --   --  8.6*  HCT  --   --  26.4*  --  25.9*  --   --  25.3*  PLT  --   --  181  --  175  --   --  171  HEPARINUNFRC 0.39  --   --   --   --  0.31  --  0.42  CREATININE  --   --  4.40*  < > 4.04*  --  3.87* 3.84*  < > = values in this interval not displayed.  Estimated Creatinine Clearance: 14.4 mL/min (A) (by C-G formula based on SCr of 3.84 mg/dL (H)).  Medications: Heparin @ 1300  units/hr  Assessment: 56yof on eliquis for afib, s/p DCCV on 6/18, transitioned to IV heparin 6/20 anticipating CVVHD. CVVHD stopped 6/27, temp IJ catheter placed 6/28, heparin resumed, and CVVHD resumed 6/29. CRRT stopped again 7/1.   Heparin level continues to be therapeutic this morning at 0.42. CBC stable. No bleeding noted. Hgb low but stable.  Goal of Therapy:  Heparin level 0.3-0.7 units/ml Monitor platelets by anticoagulation protocol: Yes   Plan:  1) Continue heparin at 1300 units/hr 2) Daily heparin level and CBC 3) Follow up plan for oral anticoagulation eventually.  Sheppard CoilFrank Krystalyn Kubota  PharmD., BCPS Clinical Pharmacist Pager [phone] 09/16/2016 9:08 AM

## 2016-09-16 NOTE — Progress Notes (Signed)
Patient ID: Jessica [name], female   DOB: 1960-09-20, 56 y.o.   MRN: [id number]   Advanced Heart Failure Rounding Note   Subjective:    Admitted with new onset atrial flutter and ADHF. Milrinone started 6/15 for worsening HF and renal failure.  S/P TEE and successful DC-CV.   Moved to ICU on 6/19 due to low output and worsening AKI. CVVHD catheter placed. CVVHD stopped 6/27. Resumed 6/29. Then stopped 6/30  Remains off CVVHD. Feels good. 1,700cc out off lasix. CVP 12. Creatinine coming down slowly - 3.8 today. NE down to 5. On milrinone 0.375. MAPs 70-80. Co-ox 5%    Objective:   Weight Range:  Vital Signs:   Temp:  [97.7 F (36.5 C)-99.5 F (37.5 C)] 99.3 F (37.4 C) (07/08 0700) Pulse Rate:  [55-64] 63 (07/08 1000) Resp:  [13-26] 23 (07/08 1000) BP: (97-122)/(52-76) 119/65 (07/08 1000) SpO2:  [89 %-96 %] 95 % (07/08 1000) Weight:  [57.5 kg (126 lb 12.2 oz)] 57.5 kg (126 lb 12.2 oz) (07/08 0500) Last BM Date: 09/15/16  Weight change: Filed Weights   09/14/16 0500 09/15/16 0344 09/16/16 0500  Weight: 58.8 kg (129 lb 10.1 oz) 58.9 kg (129 lb 12.8 oz) 57.5 kg (126 lb 12.2 oz)    Intake/Output:   Intake/Output Summary (Last 24 hours) at 09/16/16 1129 Last data filed at 09/16/16 1000  Gross per 24 hour  Intake          1220.25 ml  Output             1750 ml  Net          -529.75 ml     Physical Exam: CVP 12 General:  Sitting on side of bed. NAD HEENT: normal anicteric Neck: supple. JVP to jaw  RIJ trialysis cath Carotids 2+ bilat; no bruits. No lymphadenopathy or thryomegaly appreciated. Cor: PMI nondisplaced.RRR +s3 Lungs: clear mildly decreased in bases  Abdomen: soft NT. ND good BS   Extremities: no cyanosis, clubbing, rash, trace edema Neuro: alert & oriented x 3, cranial nerves grossly intact. moves all 4 extremities w/o difficulty. Affect pleasant    Telemetry: NSR 50-60s occasional PVCs Personally reviewed   Labs: Basic Metabolic Panel:  Recent  Labs Lab 09/10/16 0402  09/11/16 0341  09/12/16 0358  09/13/16 4098  09/14/16 0451 09/14/16 1657 09/15/16 0513 09/15/16 1654 09/16/16 0456  NA 124*  < > 121*  < > 122*  < > 121*  < > 121* 123* 124* 125* 122*  K 4.4  < > 5.0  < > 4.6  < > 4.8  < > 4.0 3.8 4.2 4.3 4.5  CL 92*  < > 91*  < > 88*  < > 88*  < > 89* 91* 92* 91* 92*  CO2 25  < > 20*  < > 22  < > 21*  < > 21* 21* 22 21* 20*  GLUCOSE 318*  < > 303*  < > 283*  < > 253*  < > 249* 240* 152* 157* 136*  BUN 25*  < > 40*  < > 53*  < > 61*  < > 67* 64* 69* 66* 70*  CREATININE 3.18*  < > 4.13*  < > 4.38*  < > 4.42*  < > 4.40* 3.95* 4.04* 3.87* 3.84*  CALCIUM 8.9  < > 8.8*  < > 9.1  < > 9.0  < > 9.2 8.6* 9.3 9.2 9.2  MG 2.2  --  2.2  --  2.2  --  2.2  --  2.2  --   --   --   --   PHOS 3.2  < > 4.3  < > 5.5*  < > 5.6*  < > 6.0* 5.5* 5.8* 5.6* 5.4*  < > = values in this interval not displayed.  Liver Function Tests:  Recent Labs Lab 09/14/16 0451 09/14/16 1657 09/15/16 0513 09/15/16 1654 09/16/16 0456  ALBUMIN 3.0* 2.8* 3.0* 2.9* 3.0*   No results for input(s): LIPASE, AMYLASE in the last 168 hours. No results for input(s): AMMONIA in the last 168 hours.  CBC:  Recent Labs Lab 09/12/16 0358 09/13/16 0521 09/14/16 0451 09/15/16 0513 09/16/16 0456  WBC 5.6 4.9 4.3 4.9 5.9  HGB 9.1* 9.1* 8.9* 8.8* 8.6*  HCT 26.6* 26.9* 26.4* 25.9* 25.3*  MCV 81.8 81.8 82.2 80.9 81.6  PLT 186 202 181 175 171    Cardiac Enzymes: No results for input(s): CKTOTAL, CKMB, CKMBINDEX, TROPONINI in the last 168 hours.  BNP: BNP (last 3 results)  Recent Labs  09/28/15 1114  BNP 871.3*    ProBNP (last 3 results) No results for input(s): PROBNP in the last 8760 hours.    Other results:  Imaging: No results found.   Medications:     Scheduled Medications: . amiodarone  200 mg Oral Daily  . aspirin EC  81 mg Oral Daily  . Chlorhexidine Gluconate Cloth  6 each Topical Q0600  . insulin aspart  0-9 Units Subcutaneous TID WC   . insulin aspart  2 Units Subcutaneous TID WC  . insulin glargine  10 Units Subcutaneous Daily  . pravastatin  20 mg Oral q1800  . sildenafil  20 mg Oral TID  . sodium chloride flush  10-40 mL Intracatheter Q12H    Infusions: . heparin 1,300 Units/hr (09/16/16 1000)  . milrinone 0.375 mcg/kg/min (09/16/16 1000)  . norepinephrine (LEVOPHED) Adult infusion 6 mcg/min (09/16/16 1000)  . sodium chloride      PRN Medications: acetaminophen, albuterol, chlorpheniramine-HYDROcodone, docusate sodium, heparin, hydrOXYzine, mineral oil-hydrophilic petrolatum, ondansetron (ZOFRAN) IV, sodium chloride, sodium chloride flush   Patient Profile:   Jessica [name] is a 56 year old with h/o chronic systolic heart failure, ICM, Biotronik ICD, and PAH admitted new onset AFL and cardiogenic shock (EF 10%). Developed AKI requiring CVVHD.  Assessment/Plan/Discussion:    1. A/C Systolic Biventricular Heart Failure -> cardiogenic shock:  - Ischemic cardiomyopathy.  Echo 08/24/16. LVEF 10% severe RV HK (EF down further in setting of AFL).   - Remains on milrinone 0.375 mcg/kg/min. Norepi weaned down to 5 mcg. Coox marginal at 55%  CVP1 2 today - Will hold diuretics again today.  - Will continue milrinone and NE at current rate today. Can wean norepi slowly. Keep SBP > 110 to maintain renal perfusion and Co-ox 55% of greater - No bb or ACE/ARB/ARNI with low output and AKI .  - Poor prognosis overall.  2. A flutter RVR:  - New onset June 11 with device interrogation. S/P TEE DC-CV 6/18 with conversion to NSR.  - Remains in NSR on amio 200 mg daily. HR stable in 50-60s  - Continue heparin drip. (Was on Eliquis at home)  - Hgb 10.3 -> 9.5 ->9.6-> 9.1 -> 9.1-> 8.9-> 8.9->8.6 - Discussed with PharmD. Likely can resume Eliquis soon at 2.5 bid  3. Acute on chronic renal failure (baseline CKD 3) due to cardio-renal syndrome.  - CVVHD started 6/20. Restarted 6/29. Held 09/09/16 with low volume status.   -  Renal  function starting to improve. ~1700 cc urine out with no lasix. Creatinine coming down slowly.  - CVP 12 as above. Will hold diuretics unless CVP 15 or greater.  - Will remove dialysis cath - Keep SBP > 110  4. PAH - Continue sildenafil 20 mg TID for now. No change.  .  5. CAD S/P multiple interventions.  - No s/s ischemia Continue 81 mg aspirin. Continue pravastatin.   - No change  6. PAD:  -occluded left SFA with mild/mod abnormal ABIs. No revascularization was needed October 2017. No change.    7. H/O LV Thrombus: no thrombus on TEE. Remains on heparin for AFL. No change.   8. Hyponatremia:  - Serum sodium 122 this am.  - Continue free water restriction.  -Can give a dose of tolvaptan if goes to 120 or becomes symptomatic  9. Deconditioning - PT following. Recommending HHPT.   No change.   10. ID/UTI - Rocephin started 7/3. UCx with insignificant growth. No change.   11. DM2 - CBG had been running 200-250. Insulin adjusted 09/11/16. CBGs continue to run 140-190. - Covering with SSI. Appreciate input of diabetes coordinator.   Arvilla Meres, MD  09/16/2016 11:29 AM  Advanced Heart Failure Team Pager [phone] (M-F; 7a - 4p)  Please contact [facility] for night-coverage after hours (4p -7a ) and weekends on [email or web address]

## 2016-09-16 NOTE — Progress Notes (Signed)
Heparin gtt stopped at 1200 due to line removal, Per Dr. Gala RomneyBensimhon hold the heparin one hour prior to pulling line and for one hour after, Will plan to resume heparin at 1400.

## 2016-09-16 NOTE — Progress Notes (Signed)
[facility] Progress Note    Assessment/ Plan:   1. AKI/CKD stage 3 due to cardiorenal syndrome, oliguric. Started on CVVHD 6/20 and then has been off again on again, most recently from 6/29-09/09/16 with low cvp (has pulmonary HTN and needs higher filling pressures with goal cvp of 15-20). Holding CVVHD for now with marked increase in BUN/Cr. 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 1. Thankfully she was responding to IV diuretics and SCr has stabilized; last dose of Lasix on 09/13/16. 2. Continue to hold CVVHD and follow --> UOP adequate I&O /24hr 1808 - 4300 = -2491. 3. Prefer not to restart CVVHD as she is not a candidate for longterm dialysis and is currently pressor dependent (still on Milrinone and Levo 8mcg). CVVHD is merely a temporizing measure and has helped somewhat but overall her prognosis remains poor.  4. Recommend palliative care consult to help set goals/limits of care  Let's remove the catheter today.  2. Acute on chronic biventricular heart failure/cardiogenic shock- on milrinone/norepi. Holding CVVHD for now until CVP reaches 15-20 (was 13-14this am) 1. Agree with continuing metolazone and IV lasix based on CVP and follow UOP, however I have reservations about resuming CVVHD as she remains pressor dependent and she is not able to tolerate IHD. Currently off Lasix. 3. A flutter with rvr- s/p DC-CV, on amio 4. Pulmonary HTN- on sildenafil 20mg  tid 5. Anemia of chronic disease- not sure if ESA is appropriate in her situation 6. Hyponatremia- due to chf, hopefully will stabilize in low 120's, already on fluid restriction and had been on diuretics. If SNa drops then may consider Tolvaptan ->d/w the heart failure team.  - SNa actually incr from 121 -> 124 so no pressing need to start.             -> I&O [phone] = net neg 1140 /24hrs 7. LV thrombus- on eliquis and heparin 8. Vascular access- RIJ temporary catheter placed 09/05/16, will  need to be replaced or removed in the next 1-2 days to prevent catheter-related bacteremia.  Subjective:   Breathing same, as long as bed is  at  30 degree she feels more comfortable. Denies CP, palpitations. No foley.   Objective:   BP 110/63   Pulse 60   Temp 99.5 F (37.5 C) (Oral)   Resp 17   Ht 5\' 5"  (1.651 m)   Wt 57.5 kg (126 lb 12.2 oz)   SpO2 95%   BMI 21.09 kg/m   Intake/Output Summary (Last 24 hours) at 09/16/16 0747 Last data filed at 09/16/16 0700  Gross per 24 hour  Intake          1126.83 ml  Output             1750 ml  Net          -623.17 ml   Weight change: -1.377 kg (-3 lb 0.6 oz)  Physical Exam: Gen:NAD, sitting up in bed CVS: bradycardic Resp:bibasilar crackles 1/3 up from base OZH:YQMVHQAbd:benign ION:GEXBMWUExt:tredema  Imaging: No results found.  Labs: BMET  Recent Labs Lab 09/13/16 0521 09/13/16 1600 09/14/16 0451 09/14/16 1657 09/15/16 0513 09/15/16 1654 09/16/16 0456  NA 121* 123* 121* 123* 124* 125* 122*  K 4.8 4.5 4.0 3.8 4.2 4.3 4.5  CL 88* 91* 89* 91* 92* 91* 92*  CO2 21* 22 21* 21* 22 21* 20*  GLUCOSE 253* 136* 249* 240* 152* 157* 136*  BUN 61* 64* 67* 64* 69*  66* 70*  CREATININE 4.42* 4.30* 4.40* 3.95* 4.04* 3.87* 3.84*  CALCIUM 9.0 9.2 9.2 8.6* 9.3 9.2 9.2  PHOS 5.6* 6.2* 6.0* 5.5* 5.8* 5.6* 5.4*   CBC  Recent Labs Lab 09/13/16 0521 09/14/16 0451 09/15/16 0513 09/16/16 0456  WBC 4.9 4.3 4.9 5.9  HGB 9.1* 8.9* 8.8* 8.6*  HCT 26.9* 26.4* 25.9* 25.3*  MCV 81.8 82.2 80.9 81.6  PLT 202 181 175 171    Medications:    . amiodarone  200 mg Oral Daily  . aspirin EC  81 mg Oral Daily  . Chlorhexidine Gluconate Cloth  6 each Topical Q0600  . insulin aspart  0-9 Units Subcutaneous TID WC  . insulin aspart  2 Units Subcutaneous TID WC  . insulin glargine  10 Units Subcutaneous Daily  . pravastatin  20 mg Oral q1800  . sildenafil  20 mg Oral TID  . sodium chloride flush  10-40 mL Intracatheter Q12H      Paulene Floor, MD 09/16/2016,  7:47 AM

## 2016-09-17 LAB — RENAL FUNCTION PANEL
ANION GAP: 10 (ref 5–15)
Albumin: 2.9 g/dL — ABNORMAL LOW (ref 3.5–5.0)
BUN: 78 mg/dL — AB (ref 6–20)
CHLORIDE: 92 mmol/L — AB (ref 101–111)
CO2: 21 mmol/L — ABNORMAL LOW (ref 22–32)
Calcium: 9.2 mg/dL (ref 8.9–10.3)
Creatinine, Ser: 4.21 mg/dL — ABNORMAL HIGH (ref 0.44–1.00)
GFR calc Af Amer: 12 mL/min — ABNORMAL LOW (ref >60)
GFR calc non Af Amer: 11 mL/min — ABNORMAL LOW (ref >60)
GLUCOSE: 127 mg/dL — AB (ref 65–99)
POTASSIUM: 5 mmol/L (ref 3.5–5.1)
Phosphorus: 6.2 mg/dL — ABNORMAL HIGH (ref 2.5–4.6)
Sodium: 123 mmol/L — ABNORMAL LOW (ref 135–145)

## 2016-09-17 LAB — GLUCOSE, CAPILLARY
GLUCOSE-CAPILLARY: 183 mg/dL — AB (ref 65–99)
GLUCOSE-CAPILLARY: 145 mg/dL — AB (ref 65–99)
GLUCOSE-CAPILLARY: 153 mg/dL — AB (ref 65–99)
GLUCOSE-CAPILLARY: 154 mg/dL — AB (ref 65–99)
Glucose-Capillary: 161 mg/dL — ABNORMAL HIGH (ref 65–99)
Glucose-Capillary: 124 mg/dL — ABNORMAL HIGH (ref 65–99)
Glucose-Capillary: 125 mg/dL — ABNORMAL HIGH (ref 65–99)

## 2016-09-17 LAB — COOXEMETRY PANEL
CARBOXYHEMOGLOBIN: 1.6 % — AB (ref 0.5–1.5)
CARBOXYHEMOGLOBIN: 1.3 % (ref 0.5–1.5)
Carboxyhemoglobin: 1.6 % — ABNORMAL HIGH (ref 0.5–1.5)
Methemoglobin: 1.2 % (ref 0.0–1.5)
Methemoglobin: 0.9 % (ref 0.0–1.5)
Methemoglobin: 1.2 % (ref 0.0–1.5)
O2 SAT: 43.7 %
O2 SAT: 37.2 %
O2 SAT: 46.3 %
TOTAL HEMOGLOBIN: 14.6 g/dL (ref 12.0–16.0)
TOTAL HEMOGLOBIN: 8.1 g/dL — AB (ref 12.0–16.0)
Total hemoglobin: 8.6 g/dL — ABNORMAL LOW (ref 12.0–16.0)

## 2016-09-17 LAB — HEPARIN LEVEL (UNFRACTIONATED): HEPARIN UNFRACTIONATED: 0.43 [IU]/mL (ref 0.30–0.70)

## 2016-09-17 LAB — CBC
HCT: 24.9 % — ABNORMAL LOW (ref 36.0–46.0)
HEMOGLOBIN: 8.4 g/dL — AB (ref 12.0–15.0)
MCH: 28.1 pg (ref 26.0–34.0)
MCHC: 33.7 g/dL (ref 30.0–36.0)
MCV: 83.3 fL (ref 78.0–100.0)
Platelets: 164 10*3/uL (ref 150–400)
RBC: 2.99 MIL/uL — ABNORMAL LOW (ref 3.87–5.11)
RDW: 16.8 % — AB (ref 11.5–15.5)
WBC: 4.4 10*3/uL (ref 4.0–10.5)

## 2016-09-17 MED ORDER — DOBUTAMINE IN D5W 4-5 MG/ML-% IV SOLN
10.0000 ug/kg/min | INTRAVENOUS | Status: DC
  Administered 2016-09-17: 5 ug/kg/min via INTRAVENOUS
  Administered 2016-09-19: 7 ug/kg/min via INTRAVENOUS
  Administered 2016-09-20 – 2016-09-30 (×8): 10 ug/kg/min via INTRAVENOUS
  Filled 2016-09-17 (×11): qty 250

## 2016-09-17 NOTE — Progress Notes (Signed)
Patient ID: Jessica [name], female   DOB: Aug 06, 1960, 56 y.o.   MRN: [id number]   Advanced Heart Failure Rounding Note   Subjective:    Admitted with new onset atrial flutter and ADHF. Milrinone started 6/15 for worsening HF and renal failure.  S/P TEE and successful DC-CV.   Moved to ICU on 6/19 due to low output and worsening AKI. CVVHD catheter placed. CVVHD stopped 6/27. Resumed 6/29. Then stopped 6/30  Remains off CVVHD.   Remains on norepi 4 mcg + milrinone 0.375 mcg. Todays CO-OX 44%. Maps 70s. Over 1 liter. CVP 17.   Denies SOB.     Objective:   Weight Range:  Vital Signs:   Temp:  [97.9 F (36.6 C)-99.2 F (37.3 C)] 99.2 F (37.3 C) (07/09 0734) Pulse Rate:  [44-66] 51 (07/09 0700) Resp:  [14-23] 19 (07/09 0700) BP: (89-119)/(50-66) 102/55 (07/09 0700) SpO2:  [88 %-100 %] 97 % (07/09 0700) Weight:  [134 lb 8 oz (61 kg)] 134 lb 8 oz (61 kg) (07/09 0500) Last BM Date: 09/15/16  Weight change: Filed Weights   09/15/16 0344 09/16/16 0500 09/17/16 0500  Weight: 129 lb 12.8 oz (58.9 kg) 126 lb 12.2 oz (57.5 kg) 134 lb 8 oz (61 kg)    Intake/Output:   Intake/Output Summary (Last 24 hours) at 09/17/16 0802 Last data filed at 09/17/16 0700  Gross per 24 hour  Intake          1357.05 ml  Output             1060 ml  Net           297.05 ml     Physical Exam: CVP 17  General:  Chronically ill. No resp difficulty. Sitting on the side of the bed.  HEENT: normal Neck: supple. JVP to jaw. Carotids 2+ bilat; no bruits. No lymphadenopathy or thryomegaly appreciated. Cor: PMI nondisplaced. Regular rate & rhythm. No rubs,or murmurs. + S3  Lungs: clear Abdomen: soft, nontender, nondistended. No hepatosplenomegaly. No bruits or masses. Good bowel sounds. Extremities: no cyanosis, clubbing, rash, R and LLE trace-1+ edema. edema Neuro: alert & orientedx3, cranial nerves grossly intact. moves all 4 extremities w/o difficulty. Affect pleasant      Telemetry: NSR 50s.  Personally reviewed.    Labs: Basic Metabolic Panel:  Recent Labs Lab 09/11/16 0341  09/12/16 7829  09/13/16 5621  09/14/16 0451  09/15/16 3086 09/15/16 1654 09/16/16 0456 09/16/16 1558 09/17/16 0500  NA 121*  < > 122*  < > 121*  < > 121*  < > 124* 125* 122* 121* 123*  K 5.0  < > 4.6  < > 4.8  < > 4.0  < > 4.2 4.3 4.5 4.6 5.0  CL 91*  < > 88*  < > 88*  < > 89*  < > 92* 91* 92* 91* 92*  CO2 20*  < > 22  < > 21*  < > 21*  < > 22 21* 20* 21* 21*  GLUCOSE 303*  < > 283*  < > 253*  < > 249*  < > 152* 157* 136* 183* 127*  BUN 40*  < > 53*  < > 61*  < > 67*  < > 69* 66* 70* 68* 78*  CREATININE 4.13*  < > 4.38*  < > 4.42*  < > 4.40*  < > 4.04* 3.87* 3.84* 3.82* 4.21*  CALCIUM 8.8*  < > 9.1  < > 9.0  < > 9.2  < >  9.3 9.2 9.2 9.1 9.2  MG 2.2  --  2.2  --  2.2  --  2.2  --   --   --   --   --   --   PHOS 4.3  < > 5.5*  < > 5.6*  < > 6.0*  < > 5.8* 5.6* 5.4* 5.5* 6.2*  < > = values in this interval not displayed.  Liver Function Tests:  Recent Labs Lab 09/15/16 0513 09/15/16 1654 09/16/16 0456 09/16/16 1558 09/17/16 0500  ALBUMIN 3.0* 2.9* 3.0* 3.0* 2.9*   No results for input(s): LIPASE, AMYLASE in the last 168 hours. No results for input(s): AMMONIA in the last 168 hours.  CBC:  Recent Labs Lab 09/13/16 0521 09/14/16 0451 09/15/16 0513 09/16/16 0456 09/17/16 0500  WBC 4.9 4.3 4.9 5.9 4.4  HGB 9.1* 8.9* 8.8* 8.6* 8.4*  HCT 26.9* 26.4* 25.9* 25.3* 24.9*  MCV 81.8 82.2 80.9 81.6 83.3  PLT 202 181 175 171 164    Cardiac Enzymes: No results for input(s): CKTOTAL, CKMB, CKMBINDEX, TROPONINI in the last 168 hours.  BNP: BNP (last 3 results)  Recent Labs  09/28/15 1114  BNP 871.3*    ProBNP (last 3 results) No results for input(s): PROBNP in the last 8760 hours.    Other results:  Imaging: No results found.   Medications:     Scheduled Medications: . amiodarone  200 mg Oral Daily  . aspirin EC  81 mg Oral Daily  . Chlorhexidine Gluconate Cloth  6  each Topical Q0600  . insulin aspart  0-9 Units Subcutaneous TID WC  . insulin aspart  2 Units Subcutaneous TID WC  . insulin glargine  10 Units Subcutaneous Daily  . pravastatin  20 mg Oral q1800  . sildenafil  20 mg Oral TID  . sodium chloride flush  10-40 mL Intracatheter Q12H    Infusions: . heparin 1,300 Units/hr (09/17/16 0700)  . milrinone 0.375 mcg/kg/min (09/17/16 0700)  . norepinephrine (LEVOPHED) Adult infusion 4 mcg/min (09/17/16 0700)  . sodium chloride      PRN Medications: acetaminophen, albuterol, chlorpheniramine-HYDROcodone, docusate sodium, heparin, hydrOXYzine, mineral oil-hydrophilic petrolatum, ondansetron (ZOFRAN) IV, sodium chloride, sodium chloride flush   Patient Profile:   Jessica [name] is a 56 year old with h/o chronic systolic heart failure, ICM, Biotronik ICD, and PAH admitted new onset AFL and cardiogenic shock (EF 10%). Developed AKI requiring CVVHD.  Assessment/Plan/Discussion:    1. A/C Systolic Biventricular Heart Failure -> cardiogenic shock:  - Ischemic cardiomyopathy.  Echo 08/24/16. LVEF 10% severe RV HK (EF down further in setting of AFL).   - Remains on milrinone 0.375 mcg/kg/min + norepi 4 mcg. Todays CO-OX 45%. Repeat now.  Increase  norepi to maintain SBP 110 or greater. Then stop milrinone and switch to dobutamine 5 mcg.   - Will hold diuretics again today.  - Will continue milrinone and NE at current rate today. Can wean norepi slowly.  - No bb or ACE/ARB/ARNI with low output and AKI .  - Poor prognosis overall.  2. A flutter RVR:  - New onset June 11 with device interrogation. S/P TEE DC-CV 6/18 with conversion to NSR.  - Remains in NSR on amio 200 mg daily. HR stable in 50-60s  - Continue heparin drip. (Was on Eliquis at home)  - Hgb 8.4  - Discussed with PharmD.  Likely can resume Eliquis soon at 2.5 bid.   3. Acute on chronic renal failure (baseline CKD 3) due to cardio-renal  syndrome.  - CVVHD started 6/20. Restarted 6/29. Held  09/09/16 with low volume status.   -renal function trending up. CVP 17.  -Hold diuretics. .  - Keep SBP > 110  4. PAH - Stop sildenafil with low BP.   .  5. CAD S/P multiple interventions.  - No s/s ischemia Continue 81 mg aspirin. Continue pravastatin.   - No change  6. PAD:  -occluded left SFA with mild/mod abnormal ABIs. No revascularization was needed October 2017. No change.    7. H/O LV Thrombus: no thrombus on TEE. Continue heparin for A flutter. No change.   8. Hyponatremia:  - Sodium 123  - Continue free water restriction.  -Can give a dose of tolvaptan if goes to 120 or becomes symptomatic  9. Deconditioning - PT following. Recommending HHPT.   OOB today.   10. ID/UTI - Completed Rocephin for UTI on 7/7.    11. DM2 - CBG better controlled. Continue SSI.    [name], NP  09/17/2016 8:02 AM  Advanced Heart Failure Team Pager [phone] (M-F; 7a - 4p)  Please contact [facility] for night-coverage after hours (4p -7a ) and weekends on [email or web address]  Agree with above.  She is in recurrent shock with low co-ox and worsening renal function after norepi titrated down gently. Fatigued on exam. CVP climbing.  JVP elevated Cor RRR + s3 Lungs mild basilar crackles Ab soft. NT/ND Ext cool minimal edema  She has recurrent shock. Unable to wean off norepi.   We have switched from milrinone to dobutamine. Will increase as needed to try and replace NE and milrinone. I had a long talk with her today and told her that this is our last hope to save her life. If renal failure recurs, will need comfort care.   CRITICAL CARE Performed by: Arvilla Meres  Total critical care time: 45 minutes  Critical care time was exclusive of separately billable procedures and treating other patients.  Critical care was necessary to treat or prevent imminent or life-threatening deterioration.  Critical care was time spent personally by me (independent of midlevel providers or  residents) on the following activities: development of treatment plan with patient and/or surrogate as well as nursing, discussions with consultants, evaluation of patient's response to treatment, examination of patient, obtaining history from patient or surrogate, ordering and performing treatments and interventions, ordering and review of laboratory studies, ordering and review of radiographic studies, pulse oximetry and re-evaluation of patient's condition.  Arvilla Meres, MD  11:13 PM

## 2016-09-17 NOTE — Progress Notes (Signed)
ANTICOAGULATION CONSULT NOTE - Follow Up Consult  Pharmacy Consult for Heparin Indication: atrial fibrillation  Allergies  Allergen Reactions  . Nyquil Multi-Symptom [Pseudoeph-Doxylamine-Dm-Apap] Other (See Comments)    Fatigue, chest pressure and pain Pt tolerates Coricidin cough syrup at home  . Sudafed [Pseudoephedrine] Other (See Comments)    Fatigue, chest pressure and pain  . Lasix [Furosemide]     Rash  . Torsemide     Rash  . Zocor [Simvastatin]     Myalgias   . Latex Rash  . Ramipril Cough    Patient Measurements: Height: 5\' 5"  (165.1 cm) Weight: 134 lb 8 oz (61 kg) IBW/kg (Calculated) : 57  Vital Signs: Temp: 99.2 F (37.3 C) (07/09 0734) Temp Source: Oral (07/09 0734) BP: 102/55 (07/09 0700) Pulse Rate: 51 (07/09 0700)  Labs:  Recent Labs  09/15/16 0513 09/15/16 0514  09/16/16 0456 09/16/16 1558 09/17/16 0500  HGB 8.8*  --   --  8.6*  --  8.4*  HCT 25.9*  --   --  25.3*  --  24.9*  PLT 175  --   --  171  --  164  HEPARINUNFRC  --  0.31  --  0.42  --  0.43  CREATININE 4.04*  --   < > 3.84* 3.82* 4.21*  < > = values in this interval not displayed.  Estimated Creatinine Clearance: 13.1 mL/min (A) (by C-G formula based on SCr of 4.21 mg/dL (H)).    Assessment: 56yof previously on eliquis for afib, s/p DCCV on 6/18, transitioned to IV heparin 6/20 anticipating CVVHD. CVVHD stopped 6/27, temp IJ catheter placed 6/28, heparin resumed, and CVVHD resumed 6/29. CRRT stopped again 7/1.   Heparin drip 1300 uts/hr heparin level continues to be therapeutic this morning at 0.43. CBC low stable. No bleeding noted. .  Goal of Therapy:  Heparin level 0.3-0.7 units/ml Monitor platelets by anticoagulation protocol: Yes   Plan:  1) Continue heparin at 1300 units/hr 2) Daily heparin level and CBC 3) Follow up plan for oral anticoagulation eventually.  Leota SauersLisa Joelee Snoke Pharm.D. CPP, BCPS Clinical Pharmacist [phone] 09/17/2016 7:50 AM

## 2016-09-17 NOTE — Progress Notes (Signed)
   Milrinone stopped earlier and switched to dobutamine 5 mcg. Continues on norepi 6 mcg. CO-OX 46%.   Increase dobutamine 6 mcg.   Drayke Grabel NP-C  4:34 PM

## 2016-09-17 NOTE — Progress Notes (Signed)
This encounter was created in error - please disregard.

## 2016-09-17 NOTE — Progress Notes (Signed)
Noted ECG changes on assessment from SB to a junctional rhythm; VSS stable patient is AOX4; MD notified. Will continue to monitor.  Ivery QualeSean Derec Mozingo RN

## 2016-09-17 NOTE — Care Management Note (Signed)
Case Management Note Previous CM note initiated by Gala LewandowskyGraves-Bigelow, Brenda Kaye, RN--08/23/2016, 4:55 PM   Patient Details  Name: [name] MRN: [id number] Date of Birth: [birth year]  Subjective/Objective: Pt presented for New onset A Fib. Initially started on IV Amio gtt. Benefits Check completed for Eliquis. Pt will need 30 day free card prior to d/c.                     Action/Plan: S/W JAMIE @ [facility] # [phone]  1. ELIQUIS  2.5 MG BID   COVER- YES  CO-PAY-$ 38.70  TIER- 2 DRUG  PRIOR APPROVAL-NO   2. ELIQUIS  5 MG BID   COVER- YES  CO-PAY- $ 40.00  TIER- 2 DRUG  PRIOR APPROVAL- NO   PHARMACY : [facility]   Expected Discharge Date:                  Expected Discharge Plan:  Home/Self Care  In-House Referral:  NA  Discharge planning Services  CM Consult, Medication Assistance  Post Acute Care Choice:    Choice offered to:     DME Arranged:    DME Agency:     HH Arranged:    HH Agency:     Status of Service:  In process, will continue to follow  If discussed at Long Length of Stay Meetings, dates discussed:  6-19, 6/21, 6/26, 6/28, 7/3, 7/5  Discharge Disposition:   Additional Comments:  09/17/16- 1000- Donn PieriniKristi Sisto Granillo RN, CM-  CM continues to follow,  Noted CVVHD on hold for marked increase in BUN/Cr- pt not a candidate for IHD - pressor dependent ?palliative care for GOC  08/29/16- 1000- Estiven Kohan RN, CM- pt with worsening renal failure- moved to ICU- Will plan placement of Trialysis catheter and initiation of CVVHD- now on norepi, continue Milrinone,  Prognosis guarded- CM to continue to follow for d/c needs   08-28-16 1001 Tomi BambergerBrenda Graves-Bigelow, RN, BSN [phone] Pt post successful TEE Cardioversion 08-27-16. Pt continues on IV Milrinone and IV Lasix. Pt will continue to monitor for disposition needs.    1508 08-27-16 Tomi BambergerBrenda Graves-Bigelow, RN,BSN [phone] Pt continues on IV Amio gtt, IV Lasix and Milrinone gtt. Cardioversion 08-27-16. Pt  has DME RW, Cane and Nebulizer at home. CM will continue to monitor for disposition needs.    Zenda AlpersWebster, MilfordKristi Hall, RN 09/17/2016, 10:11 AM [phone]

## 2016-09-17 NOTE — Progress Notes (Signed)
Patient ID: Jessica [name], female   DOB: 22-Jan-1961, 56 y.o.   MRN: [id number] S: No complaints, however Co-ox dropping for the last 2 days in a row and was down to 36%, repeat 44%.   O:BP (!) 102/55   Pulse (!) 51   Temp 99.2 F (37.3 C) (Oral)   Resp 19   Ht 5\' 5"  (1.651 m)   Wt 61 kg (134 lb 8 oz)   SpO2 97%   BMI 22.38 kg/m   Intake/Output Summary (Last 24 hours) at 09/17/16 1000 Last data filed at 09/17/16 0700  Gross per 24 hour  Intake           1063.8 ml  Output             1060 ml  Net              3.8 ml   Intake/Output: I/O last 3 completed shifts: In: 1828.1 [P.O.:960; I.V.:868.1] Out: 2060 [Urine:2060]  Intake/Output this shift:  No intake/output data recorded. Weight change: 3.509 kg (7 lb 11.8 oz)  CVP 17 Gen:NAD WUJ:[id number]:bradycardic Resp:bibasilar crackles AOZ:HYQMVHAbd:benign Ext:1+ pedal edema   Recent Labs Lab 09/14/16 0451 09/14/16 1657 09/15/16 0513 09/15/16 1654 09/16/16 0456 09/16/16 1558 09/17/16 0500  NA 121* 123* 124* 125* 122* 121* 123*  K 4.0 3.8 4.2 4.3 4.5 4.6 5.0  CL 89* 91* 92* 91* 92* 91* 92*  CO2 21* 21* 22 21* 20* 21* 21*  GLUCOSE 249* 240* 152* 157* 136* 183* 127*  BUN 67* 64* 69* 66* 70* 68* 78*  CREATININE 4.40* 3.95* 4.04* 3.87* 3.84* 3.82* 4.21*  ALBUMIN 3.0* 2.8* 3.0* 2.9* 3.0* 3.0* 2.9*  CALCIUM 9.2 8.6* 9.3 9.2 9.2 9.1 9.2  PHOS 6.0* 5.5* 5.8* 5.6* 5.4* 5.5* 6.2*   Liver Function Tests:  Recent Labs Lab 09/16/16 0456 09/16/16 1558 09/17/16 0500  ALBUMIN 3.0* 3.0* 2.9*   No results for input(s): LIPASE, AMYLASE in the last 168 hours. No results for input(s): AMMONIA in the last 168 hours. CBC:  Recent Labs Lab 09/13/16 0521 09/14/16 0451 09/15/16 0513 09/16/16 0456 09/17/16 0500  WBC 4.9 4.3 4.9 5.9 4.4  HGB 9.1* 8.9* 8.8* 8.6* 8.4*  HCT 26.9* 26.4* 25.9* 25.3* 24.9*  MCV 81.8 82.2 80.9 81.6 83.3  PLT 202 181 175 171 164   Cardiac Enzymes: No results for input(s): CKTOTAL, CKMB, CKMBINDEX, TROPONINI in the  last 168 hours. CBG:  Recent Labs Lab 09/15/16 2151 09/16/16 0754 09/16/16 1153 09/16/16 1626 09/17/16 0732  GLUCAP 169* 176* 150* 144* 145*    Iron Studies: No results for input(s): IRON, TIBC, TRANSFERRIN, FERRITIN in the last 72 hours. Studies/Results: No results found. [name]. amiodarone  200 mg Oral Daily  . aspirin EC  81 mg Oral Daily  . Chlorhexidine Gluconate Cloth  6 each Topical Q0600  . insulin aspart  0-9 Units Subcutaneous TID WC  . insulin aspart  2 Units Subcutaneous TID WC  . insulin glargine  10 Units Subcutaneous Daily  . pravastatin  20 mg Oral q1800  . sodium chloride flush  10-40 mL Intracatheter Q12H    BMET    Component Value Date/Time   NA 123 (L) 09/17/2016 0500   NA 137 07/12/2016 1328   K 5.0 09/17/2016 0500   CL 92 (L) 09/17/2016 0500   CO2 21 (L) 09/17/2016 0500   GLUCOSE 127 (H) 09/17/2016 0500   BUN 78 (H) 09/17/2016 0500   BUN 32 (H) 07/12/2016 1328   CREATININE 4.21 (H)  09/17/2016 0500   CREATININE 0.91 08/25/2010 1517   CALCIUM 9.2 09/17/2016 0500   GFRNONAA 11 (L) 09/17/2016 0500   GFRNONAA >60 08/25/2010 1517   GFRAA 12 (L) 09/17/2016 0500   GFRAA >60 08/25/2010 1517   CBC    Component Value Date/Time   WBC 4.4 09/17/2016 0500   RBC 2.99 (L) 09/17/2016 0500   HGB 8.4 (L) 09/17/2016 0500   HGB 13.9 07/12/2016 1328   HCT 24.9 (L) 09/17/2016 0500   HCT 39.9 07/12/2016 1328   PLT 164 09/17/2016 0500   PLT 159 07/12/2016 1328   MCV 83.3 09/17/2016 0500   MCV 83 07/12/2016 1328   MCH 28.1 09/17/2016 0500   MCHC 33.7 09/17/2016 0500   RDW 16.8 (H) 09/17/2016 0500   RDW 16.8 (H) 07/12/2016 1328   LYMPHSABS 1.0 08/29/2016 0435   MONOABS 0.7 08/29/2016 0435   EOSABS 0.1 08/29/2016 0435   BASOSABS 0.0 08/29/2016 0435     Assessment/Plan:  1. AKI/CKD stage 3 due to cardiorenal syndrome, oliguric. Started on CVVHD 6/20 and then has been off again on again, most recently from 6/29-09/09/16 with low cvp (has pulmonary HTN and  needs higher filling pressures with goal cvp of 15-20). Holding CVVHD for now with marked increase in BUN/Cr. 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 1. BUN/Cr starting to climb again 2. Continue to hold CVVHD and would not recommend restarting CVVHD as she is not a candidate for longterm dialysis and is currently pressor dependent.  CVVHD is merely a temporizing measure and has helped somewhat but overall her prognosis remains poor and she is now almost 4 weeks out from initial insult.  3. Recommend palliative care consult to help set goals/limits of care 2. Acute on chronic biventricular heart failure/cardiogenic shock- on milrinone/norepi. Holding CVVHD for now until CVP reaches 15-20 (was 17this am) 1. I have reservations about resuming CVVHD as she remains pressor dependent and she is not able to tolerate IHD as stated above. 2. Adjustments to pressors per Heart Failure team 3. A flutter with rvr- s/p DC-CV, on amio 4. Pulmonary HTN- on sildenafil 20mg  tid 5. Anemia of chronic disease- not sure if ESA is appropriate in her situation 6. Hyponatremia- due to chf 7. LV thrombus- on eliquis and heparin 8. Vascular access-  RIJ temporary catheter placed 09/05/16, will need to be replaced or removed to prevent catheter-related bacteremia. 9. Disposition- poor prognosis despite maximal support.  Recommend palliative care consult to help set goals/limits of care.   Jessica Cords, MD [employer] [phone]

## 2016-09-17 NOTE — Progress Notes (Signed)
Physical Therapy Treatment Patient Details Name: Jessica [name] MRN: [id number] DOB: 1960/04/27 Today's Date: 09/17/2016    History of Present Illness 56yof on eliquis for afib, s/p DCCV on 6/18, transitioned to IV heparin 6/20 anticipating CVVHD due to Acute oliguric kidney injury: secondary to decompensated HF.  CVVHD stopped 09/05/16.       PT Comments    Patient sleepy but eager to mobilize. Pt tolerated ambulating 117ft with min guard A and standing rest breaks due to 2/4 DOE. VSS throughout.  Current plan remains appropriate.   Follow Up Recommendations  Home health PT;Supervision/Assistance - 24 hour     Equipment Recommendations  None recommended by PT    Recommendations for Other Services       Precautions / Restrictions Precautions Precautions: Fall Restrictions Weight Bearing Restrictions: No    Mobility  Bed Mobility               General bed mobility comments: pt OOB in chair upon arrival  Transfers Overall transfer level: Needs assistance Equipment used: Rolling walker (2 wheeled);None Transfers: Sit to/from [employer] to Stand: Min guard Stand pivot transfers: Min guard       General transfer comment: min guard for safety; stand pivot to [facility] from recliner without AD; RW used once in standing from [facility]  Ambulation/Gait Ambulation/Gait assistance: Min guard Ambulation Distance (Feet): 170 Feet Assistive device: Rolling walker (2 wheeled) Gait Pattern/deviations: Step-through pattern;Decreased stride length;Trunk flexed     General Gait Details: cues for posture; unsteady when turning corner while performing horizontal head turn but no LOB; 2 standing rest breaks due to 2/4 DOE; VSS   Stairs            Wheelchair Mobility    Modified Rankin (Stroke Patients Only)       Balance Overall balance assessment: Needs assistance Sitting-balance support: No upper extremity supported;Feet supported Sitting balance-Leahy  Scale: Good     Standing balance support: Bilateral upper extremity supported;During functional activity Standing balance-Leahy Scale: Fair Standing balance comment: can stand statically without UE support.                             Cognition Arousal/Alertness: Awake/alert Behavior During Therapy: WFL for tasks assessed/performed Overall Cognitive Status: Within Functional Limits for tasks assessed                                        Exercises      General Comments        Pertinent Vitals/Pain Pain Assessment: No/denies pain    Home Living                      Prior Function            PT Goals (current goals can now be found in the care plan section) Progress towards PT goals: Progressing toward goals    Frequency    Min 3X/week      PT Plan Current plan remains appropriate    Co-evaluation              AM-PAC PT "6 Clicks" Daily Activity  Outcome Measure  Difficulty turning over in bed (including adjusting bedclothes, sheets and blankets)?: None Difficulty moving from lying on back to sitting on the side of the bed? : None Difficulty sitting down  on and standing up from a chair with arms (e.g., wheelchair, bedside commode, etc,.)?: A Little Help needed moving to and from a bed to chair (including a wheelchair)?: A Little Help needed walking in hospital room?: A Little Help needed climbing 3-5 steps with a railing? : A Little 6 Click Score: 20    End of Session Equipment Utilized During Treatment: Gait belt;Oxygen Activity Tolerance: Patient tolerated treatment well Patient left: with call bell/phone within reach;in chair;with nursing/sitter in room Nurse Communication: Mobility status PT Visit Diagnosis: Unsteadiness on feet (R26.81);Muscle weakness (generalized) (M62.81)     Time: [id number] PT Time Calculation (min) (ACUTE ONLY): 35 min  Charges:  $Gait Training: 23-37 mins                    G  Codes:       Erline LevineKellyn Chauncey Bruno, PTA Pager: [phone]     Carolynne EdouardKellyn R Diogo Anne 09/17/2016, 4:51 PM

## 2016-09-17 NOTE — Plan of Care (Signed)
Problem: Respiratory: Goal: Respiratory symptoms related to disease process will be avoided Outcome: Not Progressing Patient desat on 6L [place]; changed to HFNC 8L  Problem: Cardiac: Goal: Cardiac symptoms related to disease process will be avoided Outcome: Not Progressing Patient rhythm change from sinus bradycardia to junctional rhythm

## 2016-09-18 DIAGNOSIS — Z515 Encounter for palliative care: Secondary | ICD-10-CM

## 2016-09-18 DIAGNOSIS — Z7189 Other specified counseling: Secondary | ICD-10-CM

## 2016-09-18 LAB — CBC
HEMATOCRIT: 24.2 % — AB (ref 36.0–46.0)
HEMOGLOBIN: 8.2 g/dL — AB (ref 12.0–15.0)
MCH: 27.9 pg (ref 26.0–34.0)
MCHC: 33.9 g/dL (ref 30.0–36.0)
MCV: 82.3 fL (ref 78.0–100.0)
Platelets: 173 10*3/uL (ref 150–400)
RBC: 2.94 MIL/uL — ABNORMAL LOW (ref 3.87–5.11)
RDW: 16.2 % — ABNORMAL HIGH (ref 11.5–15.5)
WBC: 5.2 10*3/uL (ref 4.0–10.5)

## 2016-09-18 LAB — GLUCOSE, CAPILLARY
GLUCOSE-CAPILLARY: 153 mg/dL — AB (ref 65–99)
GLUCOSE-CAPILLARY: 142 mg/dL — AB (ref 65–99)
GLUCOSE-CAPILLARY: 130 mg/dL — AB (ref 65–99)

## 2016-09-18 LAB — BASIC METABOLIC PANEL
Anion gap: 11 (ref 5–15)
BUN: 85 mg/dL — AB (ref 6–20)
CALCIUM: 9.3 mg/dL (ref 8.9–10.3)
CO2: 19 mmol/L — ABNORMAL LOW (ref 22–32)
Chloride: 89 mmol/L — ABNORMAL LOW (ref 101–111)
Creatinine, Ser: 4.31 mg/dL — ABNORMAL HIGH (ref 0.44–1.00)
GFR calc Af Amer: 12 mL/min — ABNORMAL LOW (ref >60)
GFR, EST NON AFRICAN AMERICAN: 10 mL/min — AB (ref >60)
GLUCOSE: 239 mg/dL — AB (ref 65–99)
Potassium: 5.1 mmol/L (ref 3.5–5.1)
SODIUM: 119 mmol/L — AB (ref 135–145)

## 2016-09-18 LAB — COOXEMETRY PANEL
Carboxyhemoglobin: 1.9 % — ABNORMAL HIGH (ref 0.5–1.5)
Carboxyhemoglobin: 2.2 % — ABNORMAL HIGH (ref 0.5–1.5)
METHEMOGLOBIN: 0.8 % (ref 0.0–1.5)
Methemoglobin: 1 % (ref 0.0–1.5)
O2 SAT: 48.6 %
O2 Saturation: 60.1 %
TOTAL HEMOGLOBIN: 9.3 g/dL — AB (ref 12.0–16.0)
Total hemoglobin: 8.8 g/dL — ABNORMAL LOW (ref 12.0–16.0)

## 2016-09-18 LAB — HEPARIN LEVEL (UNFRACTIONATED): HEPARIN UNFRACTIONATED: 0.41 [IU]/mL (ref 0.30–0.70)

## 2016-09-18 MED ORDER — SODIUM CHLORIDE 0.9 % IV SOLN
INTRAVENOUS | Status: DC
  Administered 2016-09-18 – 2016-09-21 (×3): via INTRAVENOUS

## 2016-09-18 MED ORDER — FUROSEMIDE 10 MG/ML IJ SOLN
160.0000 mg | Freq: Two times a day (BID) | INTRAVENOUS | Status: DC
  Administered 2016-09-18 – 2016-09-23 (×12): 160 mg via INTRAVENOUS
  Filled 2016-09-18: qty 16
  Filled 2016-09-18 (×2): qty 10
  Filled 2016-09-18 (×5): qty 16
  Filled 2016-09-18 (×4): qty 10
  Filled 2016-09-18: qty 16
  Filled 2016-09-18: qty 10

## 2016-09-18 MED ORDER — TOLVAPTAN 15 MG PO TABS
15.0000 mg | ORAL_TABLET | Freq: Once | ORAL | Status: AC
  Administered 2016-09-18: 15 mg via ORAL
  Filled 2016-09-18: qty 1

## 2016-09-18 NOTE — Progress Notes (Signed)
Patient ID: Madlyn Crosby, female   DOB: October 20, 1960, 56 y.o.   MRN: [id number]   Advanced Heart Failure Rounding Note   Subjective:    Admitted with new onset atrial flutter and ADHF. Milrinone started 6/15 for worsening HF and renal failure.  S/P TEE and successful DC-CV.   Moved to ICU on 6/19 due to low output and worsening AKI. CVVHD catheter placed. CVVHD stopped 6/27. Resumed 6/29. Then stopped 6/30  Remains off CVVHD.   Yesterday milrinone stopped and switched to dobutamine 6 mcg and norepi was increased to 14 mcg. Todays CO-OX is 48%. Urine output down.   Complaining of fatigue. Tearful.      Objective:   Weight Range:  Vital Signs:   Temp:  [97.5 F (36.4 C)-99.2 F (37.3 C)] 98.1 F (36.7 C) (07/10 0500) Pulse Rate:  [44-73] 48 (07/10 0700) Resp:  [12-20] 17 (07/10 0700) BP: (90-123)/(49-69) 109/66 (07/10 0700) SpO2:  [92 %-100 %] 99 % (07/10 0700) Weight:  [136 lb 14.5 oz (62.1 kg)] 136 lb 14.5 oz (62.1 kg) (07/10 0500) Last BM Date: 09/15/16  Weight change: Filed Weights   09/16/16 0500 09/17/16 0500 09/18/16 0500  Weight: 126 lb 12.2 oz (57.5 kg) 134 lb 8 oz (61 kg) 136 lb 14.5 oz (62.1 kg)    Intake/Output:   Intake/Output Summary (Last 24 hours) at 09/18/16 0710 Last data filed at 09/18/16 0700  Gross per 24 hour  Intake           990.82 ml  Output              525 ml  Net           465.82 ml     Physical Exam: CVP ~20 General:  Appears fatigued. Sitting on the side of the bed.  No resp difficulty HEENT: normal Neck: supple.JVP to jaw.  Carotids 2+ bilat; no bruits. No lymphadenopathy or thryomegaly appreciated. Cor: PMI nondisplaced. Regular rate & rhythm. No rubs, gallops or murmurs. Lungs: RML RLL LLL crackles 6 liters [place] Abdomen: soft, nontender, nondistended. No hepatosplenomegaly. No bruits or masses. Good bowel sounds. Extremities: no cyanosis, clubbing, rash, R and LLE 2+ edema Neuro: alert & orientedx3, cranial nerves grossly  intact. moves all 4 extremities w/o difficulty. Affect flat      Telemetry: NSR 40-60s. Personally reviewed.      Labs: Basic Metabolic Panel:  Recent Labs Lab 09/12/16 0358  09/13/16 4098  09/14/16 0451  09/15/16 0513 09/15/16 1654 09/16/16 0456 09/16/16 1558 09/17/16 0500  NA 122*  < > 121*  < > 121*  < > 124* 125* 122* 121* 123*  K 4.6  < > 4.8  < > 4.0  < > 4.2 4.3 4.5 4.6 5.0  CL 88*  < > 88*  < > 89*  < > 92* 91* 92* 91* 92*  CO2 22  < > 21*  < > 21*  < > 22 21* 20* 21* 21*  GLUCOSE 283*  < > 253*  < > 249*  < > 152* 157* 136* 183* 127*  BUN 53*  < > 61*  < > 67*  < > 69* 66* 70* 68* 78*  CREATININE 4.38*  < > 4.42*  < > 4.40*  < > 4.04* 3.87* 3.84* 3.82* 4.21*  CALCIUM 9.1  < > 9.0  < > 9.2  < > 9.3 9.2 9.2 9.1 9.2  MG 2.2  --  2.2  --  2.2  --   --   --   --   --   --  PHOS 5.5*  < > 5.6*  < > 6.0*  < > 5.8* 5.6* 5.4* 5.5* 6.2*  < > = values in this interval not displayed.  Liver Function Tests:  Recent Labs Lab 09/15/16 0513 09/15/16 1654 09/16/16 0456 09/16/16 1558 09/17/16 0500  ALBUMIN 3.0* 2.9* 3.0* 3.0* 2.9*   No results for input(s): LIPASE, AMYLASE in the last 168 hours. No results for input(s): AMMONIA in the last 168 hours.  CBC:  Recent Labs Lab 09/14/16 0451 09/15/16 0513 09/16/16 0456 09/17/16 0500 09/18/16 0306  WBC 4.3 4.9 5.9 4.4 5.2  HGB 8.9* 8.8* 8.6* 8.4* 8.2*  HCT 26.4* 25.9* 25.3* 24.9* 24.2*  MCV 82.2 80.9 81.6 83.3 82.3  PLT 181 175 171 164 173    Cardiac Enzymes: No results for input(s): CKTOTAL, CKMB, CKMBINDEX, TROPONINI in the last 168 hours.  BNP: BNP (last 3 results)  Recent Labs  09/28/15 1114  BNP 871.3*    ProBNP (last 3 results) No results for input(s): PROBNP in the last 8760 hours.    Other results:  Imaging: No results found.   Medications:     Scheduled Medications: . amiodarone  200 mg Oral Daily  . aspirin EC  81 mg Oral Daily  . Chlorhexidine Gluconate Cloth  6 each Topical  Q0600  . insulin aspart  0-9 Units Subcutaneous TID WC  . insulin aspart  2 Units Subcutaneous TID WC  . insulin glargine  10 Units Subcutaneous Daily  . pravastatin  20 mg Oral q1800  . sodium chloride flush  10-40 mL Intracatheter Q12H    Infusions: . DOBUTamine 6 mcg/kg/min (09/17/16 2000)  . heparin 1,300 Units/hr (09/17/16 2221)  . norepinephrine (LEVOPHED) Adult infusion 14 mcg/min (09/18/16 0439)  . sodium chloride      PRN Medications: acetaminophen, albuterol, chlorpheniramine-HYDROcodone, docusate sodium, hydrOXYzine, mineral oil-hydrophilic petrolatum, ondansetron (ZOFRAN) IV, sodium chloride, sodium chloride flush   Patient Profile:   Helene is a 56 year old with h/o chronic systolic heart failure, ICM, Biotronik ICD, and PAH admitted new onset AFL and cardiogenic shock (EF 10%). Developed AKI requiring CVVHD.  Assessment/Plan/Discussion:    1. A/C Systolic Biventricular Heart Failure -> cardiogenic shock:  - Ischemic cardiomyopathy.  Echo 08/24/16. LVEF 10% severe RV HK (EF down further in setting of AFL).   -Milrinone stopped and switched to dobutamine 6 mcg. Norepi increased to 14 mcg. CO-OX remains low 49%. Increase dobutamine to 7 mcg. Volume status climbing.  CVP 20. Give 160 mg IV lasix twice daily.  - No bb or ACE/ARB/ARNI with low output and AKI .   2. A flutter RVR:  - New onset June 11 with device interrogation. S/P TEE DC-CV 6/18 with conversion to NSR.  - Remains in NSR on amio 200 mg daily. HR stable in 50-60s  - Continue heparin drip. (Was on Eliquis at home)  - Hgb 8.4  - Discussed with PharmD.  Likely can resume Eliquis soon at 2.5 bid.   3. Acute on chronic renal failure (baseline CKD 3) due to cardio-renal syndrome.  - CVVHD started 6/20. Restarted 6/29. Held 09/09/16 with low volume status.   Per nephrology she is not a candidate for iHD or restarting CVVHD.  Volume status trending up. CVP 20.  - Keep SBP > 110  4. PAH - Stop sildenafil  with low BP.   .  5. CAD S/P multiple interventions.  - No s/s ischemia Continue 81 mg aspirin. Continue pravastatin.   - No CP.  6. PAD:  -occluded left SFA with mild/mod abnormal ABIs. No revascularization was needed October 2017. No change.    7. H/O LV Thrombus: no thrombus on TEE. Continue heparin for A flutter. No change.   8. Hyponatremia:  BMET pending. - Continue free water restriction.  -Can give a dose of tolvaptan if goes to 120 or becomes symptomatic  9. Deconditioning - PT following.  10. ID/UTI - Completed Rocephin for UTI on 7/7.    11. DM2 - CBG better controlled. Continue SSI.   Consult Palliative Care for GOC. I doubt she will make it out of the hospital given multisystem organ failure. [name].    Amy Clegg, NP  09/18/2016 7:10 AM  Advanced Heart Failure Team Pager [phone] (M-F; 7a - 4p)  Please contact [facility] for night-coverage after hours (4p -7a ) and weekends on [email or web address]  Agree with above.   She is critically ill with end-stage biventricular failure and progressive renal failure. CVP up and co-ox falling despite switch to dobutamine and ongoing support with norepi.   On exam, volume overloaded weak tearful  JVP to ear Cor RRR + s3 Lung crackles Ab soft mildly distended Ext cool 1-2+ edema  She is actively dying of multi-system organ failure in the setting of biventricular HF. I do not think we have any way to save her. Will titrate dobutamine. Continue norepi. Give IV lasix today. Will have Palliative Care see.   CRITICAL CARE Performed by: Arvilla MeresBensimhon, Tywone Bembenek  Total critical care time: 35 minutes  Critical care time was exclusive of separately billable procedures and treating other patients.  Critical care was necessary to treat or prevent imminent or life-threatening deterioration.  Critical care was time spent personally by me (independent of midlevel providers or residents) on the following activities: development of treatment plan  with patient and/or surrogate as well as nursing, discussions with consultants, evaluation of patient's response to treatment, examination of patient, obtaining history from patient or surrogate, ordering and performing treatments and interventions, ordering and review of laboratory studies, ordering and review of radiographic studies, pulse oximetry and re-evaluation of patient's condition.   Arvilla MeresBensimhon, Dejana Pugsley, MD  8:17 AM

## 2016-09-18 NOTE — Progress Notes (Signed)
ANTICOAGULATION CONSULT NOTE - Follow Up Consult  Pharmacy Consult for Heparin Indication: atrial fibrillation  Patient Measurements: Height: 5\' 5"  (165.1 cm) Weight: 136 lb 14.5 oz (62.1 kg) IBW/kg (Calculated) : 57  Vital Signs: Temp: 98.1 F (36.7 C) (07/10 0500) Temp Source: Oral (07/10 0500) BP: 109/66 (07/10 0700) Pulse Rate: 48 (07/10 0700)  Labs:  Recent Labs  09/16/16 0456 09/16/16 1558 09/17/16 0500 09/18/16 0306  HGB 8.6*  --  8.4* 8.2*  HCT 25.3*  --  24.9* 24.2*  PLT 171  --  164 173  HEPARINUNFRC 0.42  --  0.43 0.41  CREATININE 3.84* 3.82* 4.21*  --     Estimated Creatinine Clearance: 13.1 mL/min (A) (by C-G formula based on SCr of 4.21 mg/dL (H)).    Assessment: 56yof previously on eliquis for afib, s/p DCCV on 6/18, transitioned to IV heparin 6/20 anticipating CVVHD. CVVHD stopped 6/27, temp IJ catheter placed 6/28, heparin resumed, and CVVHD resumed 6/29. CRRT stopped again 7/1.   Heparin drip 1300 uts/hr heparin level continues to be therapeutic this morning at 0.4. CBC low stable. No bleeding noted. .  Goal of Therapy:  Heparin level 0.3-0.7 units/ml Monitor platelets by anticoagulation protocol: Yes   Plan:  1) Continue heparin at 1300 units/hr 2) Daily heparin level and CBC 3) Follow up plan for oral anticoagulation eventually.  Sheppard CoilFrank Jarrett Chicoine PharmD., BCPS Clinical Pharmacist Pager [phone] 09/18/2016 7:16 AM

## 2016-09-18 NOTE — Consult Note (Signed)
Consultation Note Date: 09/18/2016   Patient Name: Jessica [name]  DOB: 04/27/1960  MRN: [id number]  Age / Sex: 56 y.o., female  PCP: Patient, No Pcp Per Referring Physician: Jolaine Artist, MD  Reason for Consultation: Establishing goals of care  HPI/Patient Profile: 56 y.o. female  with past medical history of chronic systolic heart failure, CAD, ICD, DM, hyperlipidemia, PAD, DVT, LV thrombus, implanted cardiac monitor admitted on 08/22/2016 with new onset of atrial fibrillation with RVR detected by her ICM and increasing dyspnea. During this admission she was started on milrinone for worsening HF and renal failure. She required CVVHD for AKI/CKI d/t cardiorenal syndrome, this was held on 6/30. There was some temporary improvement in kidney function, but BUN/Cr is again starting to climb. She is not candidate for IHD d/t severe CMP (EF 10%). She has been switched from milrinone to dobutamine, and is still requiring norepi to maintain BP. Overall very poor prognosis. Per Dr. Clayborne Dana note patient is "actively dying of multi-organ failure". Palliative medicine consulted for [place].   Clinical Assessment and Goals of Care: Met with patient, 2 sons and one daughter at bedside.   I introduced Palliative Medicine as specialized medical care for people living with serious illness. It focuses on providing relief from the symptoms and stress of a serious illness. The goal is to improve quality of life for both the patient and the family.  We discussed Ms. Profit's current illness and what it means in the larger context of her on-going co-morbidities.  Natural disease trajectory and expectations at EOL were discussed. Family seemed to be surprised at discussion of patient's poor prognosis. Ms. Gedeon, herself, stated "I'm tired", and was able to verbalize that her kidney's and heart would not be able to "get better",  however, she did not grasp the seriousness of the illness.   I attempted to elicit values and goals of care important to the patient. She does not want to die in her daughter's home (where she live) and have her Grandchildren find her. Time is important to her.  The difference between aggressive medical intervention and comfort care was considered in light of the patient's goals of care. At this point she wishes to continue all aggressive care. She wants to prolong her life as long as possible. Even though she states she is tired and she realizes that her organs are failing, she wishes to remain full code and to remain in the hospital. She would not mind being on a ventilator. This also surprises her family.  Advanced directives, concepts specific to code status, artifical feeding and hydration, and rehospitalization were considered and discussed.  Hospice and Palliative Care services outpatient were explained and offered.  Questions and concerns were addressed.  Hard Choices booklet left for review. The family was encouraged to call with questions or concerns.  PMT will continue to support holistically.  Family agreed to follow up meeting- they stated they would contact me with time, likely tomorrow or Thursday.  Primary Decision Maker PATIENT  SUMMARY OF RECOMMENDATIONS -Family is processing new information re: mother is dying, this is in direct contrast to their understanding of her medical situation- they need time to process and integrate this -Patient is more aware, however, reluctant to accept and requests all aggressive care when in presence of family  -Full code -Continue full scope care -PMT will continue to follow and support holistically - Family stated they would contact PMT for follow up meeting time after they had discussed information among themselves    Code Status/Advance Care Planning:  Full code   Prognosis:    Unable to determine but overall very poor.    Discharge Planning: To Be Determined  Primary Diagnoses: Present on Admission: . Acute on chronic systolic (congestive) heart failure ([place]) . Pulmonary HTN ([place]) . AKI (acute kidney injury) ([place])   I have reviewed the medical record, interviewed the patient and family, and examined the patient. The following aspects are pertinent.  Past Medical History:  Diagnosis Date  . AICD (automatic cardioverter/defibrillator) present    a. Biotronik placed 2009. b. ICD discharge 2011, 03/2013.  [name] Alcohol abuse   . Anxiety   . Arterial embolism ([place])    a. H/o RLE extremity ischemia in 2012 s/p right iliofemoral embolectomy with compartment fasciotomy.  . Asthma   . CAD (coronary artery disease)    a. 1996: anterior wall MI tx with PTCA. b. 2003: PCI/DES to circumflex marginal. c. 2004: PTCA/CBA/stenting of ostium of D1 and LAD. d. 06/2003: s/p PTCA/DES of mRCA. e. 11/2003: CBA of bifurcation diagonal LAD. f. 2007: PTCA/DES to RCA, CBA/stenting to OM2.  . Chronic systolic heart failure ([place])    a.  secondary to severe ischemic cardiomyopathy with EF 20-25%;  b. s/p rimplantation of Biotronik single chamber ICD by Dr Lovena Le 02/2008;  c.  [place] 07/2013 showed well compensated left sided pressures with moderate PAH and low cardiac output. d. 2D Echo 03/2014: EF 20-25%, grade 2 DD, PAP 28mHg.  . CKD (chronic kidney disease), stage II   . COPD (chronic obstructive pulmonary disease) ([place]   . Diabetes mellitus, type 2 ([place]   . Dizziness   . DVT (deep venous thrombosis) ([place]    a. 06/2010.  .Marland KitchenGERD (gastroesophageal reflux disease)   . History of noncompliance with medical treatment   . HLD (hyperlipidemia)   . LV (left ventricular) mural thrombus    a. Dx on echo 03/2014.  .Marland KitchenMyocardial infarction ([place]   . Pulmonary hypertension ([place]   . Sleep apnea   . Tobacco abuse   . V-tach ([facility]    Social History   Social History  . Marital status: Divorced    Spouse name: N/A  . Number of children:  N/A  . Years of education: N/A   Occupational History  . disabled Disability   Social History Main Topics  . Smoking status: Current Some Day Smoker    Packs/day: 0.25    Years: 21.00    Types: Cigars  . Smokeless tobacco: Never Used     Comment: Smokes 2-3 cigars daily, quit smoking cigarettes 3 yrs ago.   . Alcohol use No  . Drug use: No  . Sexual activity: No   Other Topics Concern  . None   Social History Narrative  . None   Family History  Problem Relation Age of Onset  . Coronary artery disease Unknown        FMILY HISTORY   Scheduled Meds: . amiodarone  200 mg Oral Daily  .  aspirin EC  81 mg Oral Daily  . Chlorhexidine Gluconate Cloth  6 each Topical Q0600  . insulin aspart  0-9 Units Subcutaneous TID WC  . insulin aspart  2 Units Subcutaneous TID WC  . insulin glargine  10 Units Subcutaneous Daily  . pravastatin  20 mg Oral q1800  . sodium chloride flush  10-40 mL Intracatheter Q12H   Continuous Infusions: . sodium chloride 10 mL/hr at 09/18/16 1130  . DOBUTamine 7 mcg/kg/min (09/18/16 0739)  . furosemide Stopped (09/18/16 1034)  . heparin 1,300 Units/hr (09/18/16 0800)  . norepinephrine (LEVOPHED) Adult infusion 14 mcg/min (09/18/16 0800)  . sodium chloride     PRN Meds:.acetaminophen, albuterol, chlorpheniramine-HYDROcodone, docusate sodium, hydrOXYzine, mineral oil-hydrophilic petrolatum, ondansetron (ZOFRAN) IV, sodium chloride, sodium chloride flush Medications Prior to Admission:  Prior to Admission medications   Medication Sig Start Date End Date Taking? Authorizing Provider  acetaminophen (TYLENOL) 500 MG tablet Take 1,000 mg by mouth every 6 (six) hours as needed (pain).    Yes [provider]  albuterol (PROVENTIL) (2.5 MG/3ML) 0.083% nebulizer solution Take 2.5 mg by nebulization every 6 (six) hours as needed for wheezing or shortness of breath.   Yes [provider]  aspirin EC 81 MG tablet Take 1 tablet (81 mg total) by mouth  every evening. Resume aspirin 1 week following discharge. 07/21/16  Yes Duke, Tami Lin, PA  digoxin (LANOXIN) 0.125 MG tablet Take 1 tablet (0.125 mg total) by mouth every evening. 08/13/16  Yes Bensimhon, Shaune Pascal, MD  ethacrynic acid (EDECRIN) 25 MG tablet Take 6 tablets (150 mg total) by mouth 2 (two) times daily. 06/06/16  Yes Bensimhon, Shaune Pascal, MD  insulin glargine (LANTUS) 100 UNIT/ML injection Inject 5 Units into the skin daily as needed (if blood sugar >200).   Yes [provider]  magnesium oxide (MAG-OX) 400 (241.3 Mg) MG tablet TAKE 1/2 TABLET ('200MG'$ ) EVERY DAY IN THE EVENING 08/10/16  Yes Bensimhon, Shaune Pascal, MD  metolazone (ZAROXOLYN) 2.5 MG tablet Take once daily as needed (3 x max per week) for weight gain/swelling. Take extra 20 meq potassium pill with this. 08/13/16  Yes Bensimhon, Shaune Pascal, MD  potassium chloride SA (KLOR-CON M20) 20 MEQ tablet Take 40 mg (2 tabs) in am and 20 meq (1 tab) in pm 11/28/15  Yes Bensimhon, Shaune Pascal, MD  spironolactone (ALDACTONE) 25 MG tablet TAKE 1 TABLET (25 MG TOTAL) BY MOUTH DAILY EVERY EVENING 08/13/16  Yes Bensimhon, Shaune Pascal, MD   Allergies  Allergen Reactions  . Nyquil Multi-Symptom [Pseudoeph-Doxylamine-Dm-Apap] Other (See Comments)    Fatigue, chest pressure and pain Pt tolerates Coricidin cough syrup at home  . Sudafed [Pseudoephedrine] Other (See Comments)    Fatigue, chest pressure and pain  . Lasix [Furosemide]     Rash  . Torsemide     Rash  . Zocor [Simvastatin]     Myalgias   . Latex Rash  . Ramipril Cough   Review of Systems  Physical Exam  Vital Signs: BP 112/60   Pulse (!) 55   Temp 98 F (36.7 C) (Oral)   Resp 16   Ht '5\' 5"'$  (1.651 m)   Wt 62.1 kg (136 lb 14.5 oz)   SpO2 98%   BMI 22.78 kg/m  Pain Assessment: No/denies pain POSS *See Group Information*: 1-Acceptable,Awake and alert Pain Score: 0-No pain   SpO2: SpO2: 98 % O2 Device:SpO2: 98 % O2 Flow Rate: .O2 Flow Rate (L/min): 4 L/min  IO:  Intake/output summary:  Intake/Output Summary (Last 24 hours) at 09/18/16 1517 Last data filed at 09/18/16 1400  Gross per 24 hour  Intake          1003.54 ml  Output              650 ml  Net           353.54 ml    LBM: Last BM Date: 09/15/16 Baseline Weight: Weight: 53.7 kg (118 lb 6.4 oz) Most recent weight: Weight: 62.1 kg (136 lb 14.5 oz)     Palliative Assessment/Data: PPS: 30%     Thank you for this consult. Palliative medicine will continue to follow and assist as needed.    Time Total:70 Minutes Greater than 50%  of this time was spent counseling and coordinating care related to the above assessment and plan.  Signed by: Mariana Kaufman, AGNP-C Palliative Medicine    Please contact Palliative Medicine Team phone at [phone] for questions and concerns.  For individual provider: See Shea Evans

## 2016-09-18 NOTE — Progress Notes (Signed)
CRITICAL VALUE ALERT  Critical Value: BMP Sodium 119  Date & Time Notied: 09/18/16 at 1110  Provider Notified: Amy Clegg,RN  Orders Received/Actions taken: No new orders given

## 2016-09-18 NOTE — Progress Notes (Signed)
No charge note:   Palliative consult received.   Meeting with patient and family at 2pm today.  Ocie BobKasie Sharyn Brilliant, AGNP-C Palliative Medicine  Please call Palliative Medicine team phone with any questions [phone]. For individual providers please see AMION.

## 2016-09-18 NOTE — Progress Notes (Signed)
   Dobutamine increased to 7 mcg. CO-OX improved to 60%.   Sodium down to 119. Give dose of tolvaptan.   Repeat BMET in am.   Aroura Vasudevan NP-C  11:12 AM

## 2016-09-19 LAB — BASIC METABOLIC PANEL
ANION GAP: 12 (ref 5–15)
BUN: 90 mg/dL — ABNORMAL HIGH (ref 6–20)
CHLORIDE: 88 mmol/L — AB (ref 101–111)
CO2: 20 mmol/L — AB (ref 22–32)
CREATININE: 4.21 mg/dL — AB (ref 0.44–1.00)
Calcium: 9.5 mg/dL (ref 8.9–10.3)
GFR calc non Af Amer: 11 mL/min — ABNORMAL LOW (ref >60)
GFR, EST AFRICAN AMERICAN: 12 mL/min — AB (ref >60)
Glucose, Bld: 221 mg/dL — ABNORMAL HIGH (ref 65–99)
POTASSIUM: 4.6 mmol/L (ref 3.5–5.1)
SODIUM: 120 mmol/L — AB (ref 135–145)

## 2016-09-19 LAB — GLUCOSE, CAPILLARY
GLUCOSE-CAPILLARY: 133 mg/dL — AB (ref 65–99)
GLUCOSE-CAPILLARY: 127 mg/dL — AB (ref 65–99)
GLUCOSE-CAPILLARY: 77 mg/dL (ref 65–99)
Glucose-Capillary: 165 mg/dL — ABNORMAL HIGH (ref 65–99)
Glucose-Capillary: 127 mg/dL — ABNORMAL HIGH (ref 65–99)

## 2016-09-19 LAB — HEPARIN LEVEL (UNFRACTIONATED): Heparin Unfractionated: 0.36 IU/mL (ref 0.30–0.70)

## 2016-09-19 LAB — COOXEMETRY PANEL
Carboxyhemoglobin: 1.7 % — ABNORMAL HIGH (ref 0.5–1.5)
Methemoglobin: 1.2 % (ref 0.0–1.5)
O2 Saturation: 55.4 %
Total hemoglobin: 9 g/dL — ABNORMAL LOW (ref 12.0–16.0)

## 2016-09-19 NOTE — Progress Notes (Signed)
ANTICOAGULATION CONSULT NOTE - Follow Up Consult  Pharmacy Consult for Heparin Indication: atrial fibrillation  Patient Measurements: Height: 5\' 5"  (165.1 cm) Weight: 134 lb 12.8 oz (61.1 kg) IBW/kg (Calculated) : 57  Vital Signs: Temp: 98 F (36.7 C) (07/11 1129) Temp Source: Oral (07/11 1129) BP: 119/66 (07/11 1030) Pulse Rate: 58 (07/11 1030)  Labs:  Recent Labs  09/17/16 0500 09/18/16 0306 09/18/16 0937 09/19/16 0414  HGB 8.4* 8.2*  --   --   HCT 24.9* 24.2*  --   --   PLT 164 173  --   --   HEPARINUNFRC 0.43 0.41  --  0.36  CREATININE 4.21*  --  4.31* 4.21*    Estimated Creatinine Clearance: 13.1 mL/min (A) (by C-G formula based on SCr of 4.21 mg/dL (H)).  Medications: Heparin @ 1300 units/hr  Assessment: 56yof previously on eliquis for afib, s/p DCCV on 6/18, transitioned to IV heparin 6/20 anticipating CVVHD. CVVHD stopped 6/27, temp IJ catheter placed 6/28, heparin resumed, and CVVHD resumed 6/29. CRRT stopped again 7/1.   Heparin level remains therapeutic 0.36. No CBC today. No bleeding.  Goal of Therapy:  Heparin level 0.3-0.7 units/ml Monitor platelets by anticoagulation protocol: Yes   Plan:  1) Continue heparin at 1300 units/hr 2) Daily heparin level and CBC 3) Follow up plan for oral anticoagulation eventually.  Louie CasaJennifer Xzavian Semmel PharmD., BCPS 09/19/2016 12:00 PM

## 2016-09-19 NOTE — Progress Notes (Signed)
PT Cancellation Note  Patient Details Name: Jessica MoccasinSherita Albo MRN: [id number] DOB: 1960-12-07   Cancelled Treatment:    Reason Eval/Treat Not Completed: Patient declined, no reason specified PT will check on pt later as time allows.     Derek MoundKellyn R Lonya Johannesen Derel Mcglasson, PTA Pager: [phone]   09/19/2016, 11:20 AM

## 2016-09-19 NOTE — Progress Notes (Signed)
Patient ID: Jessica [name], female   DOB: [birth year], 58 y.o.   MRN: [id number]   Advanced Heart Failure Rounding Note   Subjective:    Admitted with new onset atrial flutter and ADHF. Milrinone started 6/15 for worsening HF and renal failure.  S/P TEE and successful DC-CV.   Moved to ICU on 6/19 due to low output and worsening AKI. CVVHD catheter placed. CVVHD stopped 6/27. Resumed 6/29. Then stopped 6/30  Remains off CVVHD.   Yesterday received 160 mg twice daily. Creatinine 4.2 . BUN up to 90. Currently on norepi 17 mcg + dobutamine 7 mcg. Met with Palliative Care with plans for full code.  Complaining of fatigue. Denies SOB.    Objective:   Weight Range:  Vital Signs:   Temp:  [97.9 F (36.6 C)-98.5 F (36.9 C)] 98.5 F (36.9 C) (07/11 0000) Pulse Rate:  [43-69] 58 (07/11 0700) Resp:  [11-26] 14 (07/11 0700) BP: (98-130)/(50-77) 101/57 (07/11 0700) SpO2:  [90 %-100 %] 95 % (07/11 0700) Weight:  [61.1 kg (134 lb 12.8 oz)] 61.1 kg (134 lb 12.8 oz) (07/11 0300) Last BM Date: 09/18/16  Weight change: Filed Weights   09/17/16 0500 09/18/16 0500 09/19/16 0300  Weight: 61 kg (134 lb 8 oz) 62.1 kg (136 lb 14.5 oz) 61.1 kg (134 lb 12.8 oz)    Intake/Output:   Intake/Output Summary (Last 24 hours) at 09/19/16 0720 Last data filed at 09/19/16 0700  Gross per 24 hour  Intake           1258.7 ml  Output             1450 ml  Net           -191.3 ml     Physical Exam: CVP 17.  General:  Appears fatigued. No resp difficulty HEENT: normal Neck: supple. JVD to ear. Carotids 2+ bilat; no bruits. No lymphadenopathy or thryomegaly appreciated. Cor: PMI nondisplaced. Regular rate & rhythm. No rubs, gallops or murmurs. Lungs: clear Abdomen: soft, nontender, nondistended. No hepatosplenomegaly. No bruits or masses. Good bowel sounds. Extremities: no cyanosis, clubbing, rash, R and LLE 2+ edema Neuro: alert & orientedx3, cranial nerves grossly intact. moves all 4 extremities w/o  difficulty. Affect pleasant    Telemetry: NSR 40-60s. Personally reviewed.      Labs: Basic Metabolic Panel:  Recent Labs Lab 09/13/16 0521  09/14/16 0451  09/15/16 7106 09/15/16 1654 09/16/16 0456 09/16/16 1558 09/17/16 0500 09/18/16 0937 09/19/16 0414  NA 121*  < > 121*  < > 124* 125* 122* 121* 123* 119* 120*  K 4.8  < > 4.0  < > 4.2 4.3 4.5 4.6 5.0 5.1 4.6  CL 88*  < > 89*  < > 92* 91* 92* 91* 92* 89* 88*  CO2 21*  < > 21*  < > 22 21* 20* 21* 21* 19* 20*  GLUCOSE 253*  < > 249*  < > 152* 157* 136* 183* 127* 239* 221*  BUN 61*  < > 67*  < > 69* 66* 70* 68* 78* 85* 90*  CREATININE 4.42*  < > 4.40*  < > 4.04* 3.87* 3.84* 3.82* 4.21* 4.31* 4.21*  CALCIUM 9.0  < > 9.2  < > 9.3 9.2 9.2 9.1 9.2 9.3 9.5  MG 2.2  --  2.2  --   --   --   --   --   --   --   --   PHOS 5.6*  < > 6.0*  < >  5.8* 5.6* 5.4* 5.5* 6.2*  --   --   < > = values in this interval not displayed.  Liver Function Tests:  Recent Labs Lab 09/15/16 0513 09/15/16 1654 09/16/16 0456 09/16/16 1558 09/17/16 0500  ALBUMIN 3.0* 2.9* 3.0* 3.0* 2.9*   No results for input(s): LIPASE, AMYLASE in the last 168 hours. No results for input(s): AMMONIA in the last 168 hours.  CBC:  Recent Labs Lab 09/14/16 0451 09/15/16 0513 09/16/16 0456 09/17/16 0500 09/18/16 0306  WBC 4.3 4.9 5.9 4.4 5.2  HGB 8.9* 8.8* 8.6* 8.4* 8.2*  HCT 26.4* 25.9* 25.3* 24.9* 24.2*  MCV 82.2 80.9 81.6 83.3 82.3  PLT 181 175 171 164 173    Cardiac Enzymes: No results for input(s): CKTOTAL, CKMB, CKMBINDEX, TROPONINI in the last 168 hours.  BNP: BNP (last 3 results)  Recent Labs  09/28/15 1114  BNP 871.3*    ProBNP (last 3 results) No results for input(s): PROBNP in the last 8760 hours.    Other results:  Imaging: No results found.   Medications:     Scheduled Medications: . amiodarone  200 mg Oral Daily  . aspirin EC  81 mg Oral Daily  . Chlorhexidine Gluconate Cloth  6 each Topical Q0600  . insulin aspart   0-9 Units Subcutaneous TID WC  . insulin aspart  2 Units Subcutaneous TID WC  . insulin glargine  10 Units Subcutaneous Daily  . pravastatin  20 mg Oral q1800  . sodium chloride flush  10-40 mL Intracatheter Q12H    Infusions: . sodium chloride 10 mL/hr at 09/18/16 2146  . DOBUTamine 7 mcg/kg/min (09/19/16 0312)  . furosemide Stopped (09/18/16 1855)  . heparin 1,300 Units/hr (09/18/16 2000)  . norepinephrine (LEVOPHED) Adult infusion 17 mcg/min (09/19/16 0700)  . sodium chloride      PRN Medications: acetaminophen, albuterol, chlorpheniramine-HYDROcodone, docusate sodium, hydrOXYzine, mineral oil-hydrophilic petrolatum, ondansetron (ZOFRAN) IV, sodium chloride, sodium chloride flush   Patient Profile:   Jessica [name] is a 56 year old with h/o chronic systolic heart failure, ICM, Biotronik ICD, and PAH admitted new onset AFL and cardiogenic shock (EF 10%). Developed AKI requiring CVVHD.  Assessment/Plan/Discussion:    1. A/C Systolic Biventricular Heart Failure -> cardiogenic shock:  - Ischemic cardiomyopathy.  Echo 08/24/16. LVEF 10% severe RV HK (EF down further in setting of AFL).   -Milrinone stopped and switched to dobutamine 7 mcg. Norepi to 17 mcg.   Todays CO-OX 55%.  Continue for now.  - Continue IV lasix 160 mg twice a day. CVP 17.  - No bb or ACE/ARB/ARNI with low output and AKI .   2. A flutter RVR:  - New onset June 11 with device interrogation. S/P TEE DC-CV 6/18 with conversion to NSR.  - Remains in NSR on amio 200 mg daily. HR stable in 50-60s  - Continue heparin drip. (Was on Eliquis at home)  - Discussed with PharmD. 3. Acute on chronic renal failure (baseline CKD 3) due to cardio-renal syndrome.  - CVVHD started 6/20. Restarted 6/29. Held 09/09/16 with low volume status.   Creatinine unchanged. BUN trending up.  Per nephrology she is not a candidate for iHD or restarting CVVHD.  - Keep SBP > 110 4. PAH - Stop sildenafil with low BP.   .  5. CAD S/P multiple  interventions.  - No s/s ischemia Continue 81 mg aspirin. Continue pravastatin.   - No CP.   6. PAD:  -occluded left SFA with mild/mod abnormal ABIs. No  revascularization was needed October 2017. No change.    7. H/O LV Thrombus: no thrombus on TEE. Continue heparin for A flutter. No change.   8. Hyponatremia:  Sodium 120.  Continue free water restriction. Received tolvaptan 7/10  9. Deconditioning - PT following.  10. ID/UTI - Completed Rocephin for UTI on 7/7.    11. DM2 - Stable. Continue SSI.   Palliative Care appreciated. Plan for full code.    Darrick Grinder, NP  09/19/2016 7:20 AM  Advanced Heart Failure Team Pager [phone] (M-F; 7a - 4p)  Please contact [facility] for night-coverage after hours (4p -7a ) and weekends on [email or web address]  Agree.  She remains critically ill with cardiogenic shock requiring increasing doses of pressors to maintain output. Renal function very tenuous. She has little chance of surviving. Palliative care meeting yesterday and she wants to continue aggressive care. Having periods of junctional rhythm with retrograde conduction.   Weak and ill appearing on exam JVP up Cor RRR +s3 Ab soft NT mildly distended Ext cool mild edema  She continues to deteriorate with progressive cardiogenic shock and renal failure. Requiring high-doses pressors to maintain. Wants to continue aggressive care but we are nearing point of futility. Will see what kidneys do over next few days but would not aggressively titrate pressors as we this will not improve long-term outcome.   CRITICAL CARE Performed by: Glori Bickers  Total critical care time: 35 minutes  Critical care time was exclusive of separately billable procedures and treating other patients.  Critical care was necessary to treat or prevent imminent or life-threatening deterioration.  Critical care was time spent personally by me (independent of midlevel providers or residents) on the following  activities: development of treatment plan with patient and/or surrogate as well as nursing, discussions with consultants, evaluation of patient's response to treatment, examination of patient, obtaining history from patient or surrogate, ordering and performing treatments and interventions, ordering and review of laboratory studies, ordering and review of radiographic studies, pulse oximetry and re-evaluation of patient's condition.  Glori Bickers, MD  9:19 AM

## 2016-09-19 NOTE — Progress Notes (Addendum)
Daily Progress Note   Patient Name: Jessica MoccasinSherita Dehaven       Date: 09/19/2016 DOB: [birth year]  Age: 56 y.o. MRN#: [id number] Attending Physician: Dolores PattyBensimhon, Daniel R, MD Primary Care Physician: Patient, No Pcp Per Admit Date: 08/22/2016  Reason for Consultation/Follow-up: Establishing goals of care  Subjective: Patient in bed. Somnolent, but arouses easily. Tells me that after our discussion she is thinking she "wants to go to hospice, but I'm not sure". I asked her what this means to her, and she replies that it means she would "be comfortable". I clarified with her that it would mean stopping many medications, giving medications for symptoms, and if her heart stopped and if she stopped breathing she would not be resuscitated. She verbalized understanding. She said she discussed this with her kids but her daughter isn't ready for her to transition to this path of care yet. She wants to continue aggressive care another day to give her daughter more time to accept what is happening.    Review of Systems  Constitutional: Positive for malaise/fatigue.  Respiratory: Positive for shortness of breath.   Cardiovascular: Positive for leg swelling. Negative for chest pain.  Neurological: Positive for weakness.    Length of Stay: 28  Current Medications: Scheduled Meds:  . amiodarone  200 mg Oral Daily  . aspirin EC  81 mg Oral Daily  . Chlorhexidine Gluconate Cloth  6 each Topical Q0600  . insulin aspart  0-9 Units Subcutaneous TID WC  . insulin aspart  2 Units Subcutaneous TID WC  . insulin glargine  10 Units Subcutaneous Daily  . pravastatin  20 mg Oral q1800  . sodium chloride flush  10-40 mL Intracatheter Q12H    Continuous Infusions: . sodium chloride 10 mL/hr at 09/18/16 2146  .  DOBUTamine 8 mcg/kg/min (09/19/16 1028)  . furosemide Stopped (09/19/16 0900)  . heparin 1,300 Units/hr (09/18/16 2000)  . norepinephrine (LEVOPHED) Adult infusion 17 mcg/min (09/19/16 1000)  . sodium chloride      PRN Meds: acetaminophen, albuterol, chlorpheniramine-HYDROcodone, docusate sodium, hydrOXYzine, mineral oil-hydrophilic petrolatum, ondansetron (ZOFRAN) IV, sodium chloride, sodium chloride flush  Physical Exam  Constitutional:  Cachetic, appears ill  Musculoskeletal: She exhibits edema.  Neurological:  Somnolent, arouses easily, oriented to person and place  Psychiatric: She has a normal mood and affect. Her behavior is  normal. Judgment and thought content normal.  Nursing note and vitals reviewed.           Vital Signs: BP 119/66   Pulse (!) 58   Temp 98.5 F (36.9 C) (Oral)   Resp 18   Ht 5\' 5"  (1.651 m)   Wt 61.1 kg (134 lb 12.8 oz)   SpO2 95%   BMI 22.43 kg/m  SpO2: SpO2: 95 % O2 Device: O2 Device: High Flow Nasal Cannula O2 Flow Rate: O2 Flow Rate (L/min): 2 L/min  Intake/output summary:  Intake/Output Summary (Last 24 hours) at 09/19/16 1120 Last data filed at 09/19/16 1000  Gross per 24 hour  Intake          1123.08 ml  Output             1800 ml  Net          -676.92 ml   LBM: Last BM Date: 09/18/16 Baseline Weight: Weight: 53.7 kg (118 lb 6.4 oz) Most recent weight: Weight: 61.1 kg (134 lb 12.8 oz)       Palliative Assessment/Data: PPS: 20%    Patient Active Problem List   Diagnosis Date Noted  . Palliative care by specialist   . Goals of care, counseling/discussion   . Advance care planning   . Cardiogenic shock (HCC) 08/30/2016  . Typical atrial flutter (HCC)   . Atrial fibrillation with RVR (HCC) 08/22/2016  . CAD (coronary artery disease) 01/15/2015  . Impetigo 01/15/2015  . AKI (acute kidney injury) (HCC) 01/15/2015  . Hyponatremia 01/15/2015  . Hypomagnesemia 01/15/2015  . Diabetes mellitus type 2, uncontrolled (HCC)  01/15/2015  . Acute on chronic systolic (congestive) heart failure (HCC) 01/13/2015  . Chronic systolic CHF (congestive heart failure) (HCC) 05/05/2014  . Ischemic cardiomyopathy 05/05/2014  . COPD (chronic obstructive pulmonary disease) (HCC) 05/05/2014  . Encounter for therapeutic drug monitoring 04/28/2014  . LV (left ventricular) mural thrombus (HCC) 03/25/2014  . Rheumatoid factor positive 10/26/2013  . OSA (obstructive sleep apnea) 09/03/2013  . Pulmonary HTN (HCC) 09/03/2013  . PAD (peripheral artery disease) (HCC) 10/06/2012  . Chest pain 07/03/2012  . VT (ventricular tachycardia) (HCC) 12/20/2011  . Depression with anxiety 04/30/2011  . Rash 08/24/2010  . Deep vein thrombosis of lower leg (HCC) 06/28/2010  . HYPOPOTASSEMIA 12/24/2008  . HYPERCHOLESTEROLEMIA 07/15/2008  . NICOTINE ADDICTION 07/15/2008  . DYSPNEA 03/21/2008  . NONSPECIFIC LOW BP 03/21/2008  . ICD (implantable cardioverter-defibrillator) in place 03/21/2008    Palliative Care Assessment & Plan   Patient Profile: 56 y.o. female  with past medical history of chronic systolic heart failure, CAD, ICD, DM, hyperlipidemia, PAD, DVT, LV thrombus, implanted cardiac monitor admitted on 08/22/2016 with new onset of atrial fibrillation with RVR detected by her ICM and increasing dyspnea. During this admission she was started on milrinone for worsening HF and renal failure. She required CVVHD for AKI/CKI d/t cardiorenal syndrome, this was held on 6/30. There was some temporary improvement in kidney function, but BUN/Cr is again starting to climb. She is not candidate for IHD d/t severe CMP (EF 10%). She has been switched from milrinone to dobutamine, and is still requiring norepi to maintain BP. Overall very poor prognosis. Per Dr. Prescott Gum note patient is "actively dying of multi-organ failure". Palliative medicine consulted for GOC.   Assessment/Recommendations/Plan   Patient continuing to decline showing no  improvement.   Patient states she wants hospice, but needs another day of aggressive care for her kids to have time to  accept what she wants  I am awaiting a call from son,  [name] to schedule follow up family meeting (patient requested I wait for them to call me). Will contact him tomorrow if I don't hear from him today.  Goals of Care and Additional Recommendations:  Limitations on Scope of Treatment: Full Scope Treatment  Code Status:  Full code  Prognosis:   Unable to determine I do not anticipate she will survive this hospitalization.  Discharge Planning:  To Be Determined  Care plan was discussed with patient.  Thank you for allowing the Palliative Medicine Team to assist in the care of this patient.   Total time: 25 mins  Greater than 50%  of this time was spent counseling and coordinating care related to the above assessment and plan.  Ocie Bob, AGNP-C Palliative Medicine   Please contact Palliative Medicine Team phone at [phone] for questions and concerns.

## 2016-09-19 NOTE — Progress Notes (Signed)
Physical Therapy Treatment Patient Details Name: Jessica MoccasinSherita [name] MRN: [id number] DOB: 04-Apr-1960 Today's Date: 09/19/2016    History of Present Illness 56yof on eliquis for afib, s/p DCCV on 6/18, transitioned to IV heparin 6/20 anticipating CVVHD due to Acute oliguric kidney injury: secondary to decompensated HF.  CVVHD stopped 09/05/16.       PT Comments    Patient willing to participate in therapy session. Limited today by fatigue but able to ambulate 17875ft with min guard assist for safety due to generalized weakness. PT will continue to follow acutely.    Follow Up Recommendations  Home health PT;Supervision/Assistance - 24 hour     Equipment Recommendations  None recommended by PT    Recommendations for Other Services       Precautions / Restrictions Precautions Precautions: Fall Restrictions Weight Bearing Restrictions: No    Mobility  Bed Mobility               General bed mobility comments: pt sitting EOB with grandchildren upon arrival  Transfers Overall transfer level: Needs assistance Equipment used: Rolling walker (2 wheeled);None Transfers: Sit to/from Stand Sit to Stand: Min guard         General transfer comment: min guard for safety   Ambulation/Gait Ambulation/Gait assistance: Min guard Ambulation Distance (Feet): 175 Feet Assistive device: Rolling walker (2 wheeled) Gait Pattern/deviations: Step-through pattern;Decreased stride length;Trunk flexed     General Gait Details: cues for proximity of RW; pt more fatigued today and with flexed trunk throughout   Stairs            Wheelchair Mobility    Modified Rankin (Stroke Patients Only)       Balance Overall balance assessment: Needs assistance Sitting-balance support: No upper extremity supported;Feet supported Sitting balance-Leahy Scale: Good     Standing balance support: Bilateral upper extremity supported;During functional activity Standing balance-Leahy Scale: Fair                              Cognition Arousal/Alertness: Awake/alert Behavior During Therapy: WFL for tasks assessed/performed Overall Cognitive Status: Within Functional Limits for tasks assessed                                        Exercises      General Comments General comments (skin integrity, edema, etc.): HR 46 at rest and up to low 60s with activity; other vitals WNL      Pertinent Vitals/Pain Pain Assessment: Faces Faces Pain Scale: Hurts even more Pain Location: chest when coughing/laughing Pain Descriptors / Indicators: Grimacing;Guarding Pain Intervention(s): Monitored during session;Premedicated before session;Repositioned    Home Living                      Prior Function            PT Goals (current goals can now be found in the care plan section) Progress towards PT goals: Progressing toward goals    Frequency    Min 3X/week      PT Plan Current plan remains appropriate    Co-evaluation              AM-PAC PT "6 Clicks" Daily Activity  Outcome Measure  Difficulty turning over in bed (including adjusting bedclothes, sheets and blankets)?: None Difficulty moving from lying on back to sitting on the side of  the bed? : None Difficulty sitting down on and standing up from a chair with arms (e.g., wheelchair, bedside commode, etc,.)?: A Little Help needed moving to and from a bed to chair (including a wheelchair)?: A Little Help needed walking in hospital room?: A Little Help needed climbing 3-5 steps with a railing? : A Lot 6 Click Score: 19    End of Session Equipment Utilized During Treatment: Gait belt;Oxygen Activity Tolerance: Patient limited by fatigue Patient left: with call bell/phone within reach;in bed;with family/visitor present Nurse Communication: Mobility status PT Visit Diagnosis: Unsteadiness on feet (R26.81);Muscle weakness (generalized) (M62.81)     Time: 1610-9604 PT Time  Calculation (min) (ACUTE ONLY): 21 min  Charges:  $Gait Training: 8-22 mins                    G Codes:       Erline Levine, PTA Pager: [phone]     Carolynne Edouard 09/19/2016, 4:56 PM

## 2016-09-20 LAB — BASIC METABOLIC PANEL
ANION GAP: 13 (ref 5–15)
BUN: 88 mg/dL — AB (ref 6–20)
CALCIUM: 9.4 mg/dL (ref 8.9–10.3)
CO2: 22 mmol/L (ref 22–32)
CREATININE: 4.23 mg/dL — AB (ref 0.44–1.00)
Chloride: 87 mmol/L — ABNORMAL LOW (ref 101–111)
GFR calc Af Amer: 12 mL/min — ABNORMAL LOW (ref >60)
GFR, EST NON AFRICAN AMERICAN: 11 mL/min — AB (ref >60)
GLUCOSE: 159 mg/dL — AB (ref 65–99)
Potassium: 4.2 mmol/L (ref 3.5–5.1)
Sodium: 122 mmol/L — ABNORMAL LOW (ref 135–145)

## 2016-09-20 LAB — GLUCOSE, CAPILLARY
GLUCOSE-CAPILLARY: 160 mg/dL — AB (ref 65–99)
Glucose-Capillary: 156 mg/dL — ABNORMAL HIGH (ref 65–99)
Glucose-Capillary: 121 mg/dL — ABNORMAL HIGH (ref 65–99)
Glucose-Capillary: 171 mg/dL — ABNORMAL HIGH (ref 65–99)
Glucose-Capillary: 144 mg/dL — ABNORMAL HIGH (ref 65–99)

## 2016-09-20 LAB — COOXEMETRY PANEL
CARBOXYHEMOGLOBIN: 2.2 % — AB (ref 0.5–1.5)
METHEMOGLOBIN: 0.9 % (ref 0.0–1.5)
O2 Saturation: 53.9 %
TOTAL HEMOGLOBIN: 9.4 g/dL — AB (ref 12.0–16.0)

## 2016-09-20 LAB — CBC
HCT: 26.6 % — ABNORMAL LOW (ref 36.0–46.0)
HEMOGLOBIN: 9 g/dL — AB (ref 12.0–15.0)
MCH: 27.6 pg (ref 26.0–34.0)
MCHC: 33.8 g/dL (ref 30.0–36.0)
MCV: 81.6 fL (ref 78.0–100.0)
PLATELETS: 180 10*3/uL (ref 150–400)
RBC: 3.26 MIL/uL — ABNORMAL LOW (ref 3.87–5.11)
RDW: 16.2 % — AB (ref 11.5–15.5)
WBC: 6.9 10*3/uL (ref 4.0–10.5)

## 2016-09-20 LAB — HEPARIN LEVEL (UNFRACTIONATED): HEPARIN UNFRACTIONATED: 0.37 [IU]/mL (ref 0.30–0.70)

## 2016-09-20 MED ORDER — METOLAZONE 5 MG PO TABS
5.0000 mg | ORAL_TABLET | Freq: Once | ORAL | Status: AC
  Administered 2016-09-20: 5 mg via ORAL
  Filled 2016-09-20: qty 1

## 2016-09-20 NOTE — Progress Notes (Addendum)
Daily Progress Note   Patient Name: Jessica MoccasinSherita Proch       Date: 09/20/2016 DOB: [birth year]  Age: 56 y.o. MRN#: [id number] Attending Physician: Dolores PattyBensimhon, Daniel R, MD Primary Care Physician: Patient, No Pcp Per Admit Date: 08/22/2016  Reason for Consultation/Follow-up: Establishing goals of care  Subjective:  Pt sitting on edge of bed. Sleepy. Denies SOB, chest pain, or any other symptoms. Noted plan to max dobutamine at 10 and attempt to decrease norepi tomorrow. Discussed with patient that even with all interventions her life is likely limited to days to weeks. Encouraged her to consider where and how she wants to spend that time. She said she will talk to her family tonight and make a plan. She again requested I do not call her family until she talks to them tonight, she was unable to speak with them last night as her daughter worked until 9pm. She thinks she would like to return to her daughter's home with hospice if she is able. Discussed code status, she continues to desire full code.    Review of Systems  Constitutional: Positive for malaise/fatigue.  Respiratory: Negative for shortness of breath.   Cardiovascular: Positive for leg swelling. Negative for chest pain.  Neurological: Positive for weakness.    Length of Stay: 29  Current Medications: Scheduled Meds:  . amiodarone  200 mg Oral Daily  . aspirin EC  81 mg Oral Daily  . Chlorhexidine Gluconate Cloth  6 each Topical Q0600  . insulin aspart  0-9 Units Subcutaneous TID WC  . insulin aspart  2 Units Subcutaneous TID WC  . insulin glargine  10 Units Subcutaneous Daily  . metolazone  5 mg Oral Once  . pravastatin  20 mg Oral q1800  . sodium chloride flush  10-40 mL Intracatheter Q12H    Continuous Infusions: . sodium  chloride 10 mL/hr at 09/20/16 0800  . DOBUTamine 10 mcg/kg/min (09/20/16 1407)  . furosemide Stopped (09/20/16 [id number])  . heparin 1,300 Units/hr (09/20/16 1128)  . norepinephrine (LEVOPHED) Adult infusion 15 mcg/min (09/20/16 1200)    PRN Meds: acetaminophen, albuterol, chlorpheniramine-HYDROcodone, docusate sodium, hydrOXYzine, mineral oil-hydrophilic petrolatum, ondansetron (ZOFRAN) IV, sodium chloride flush  Physical Exam  Constitutional:  Cachetic, appears ill  Musculoskeletal: She exhibits edema.  Neurological:  Somnolent, arouses easily, oriented to person and place  Psychiatric: Her  behavior is normal. Judgment and thought content normal.  Flat affect  Nursing note and vitals reviewed.           Vital Signs: BP 98/60   Pulse (!) 58   Temp 98.8 F (37.1 C) (Oral)   Resp 16   Ht 5\' 5"  (1.651 m)   Wt 60 kg (132 lb 4.4 oz)   SpO2 100%   BMI 22.01 kg/m  SpO2: SpO2: 100 % O2 Device: O2 Device: Nasal Cannula O2 Flow Rate: O2 Flow Rate (L/min): 3 L/min  Intake/output summary:   Intake/Output Summary (Last 24 hours) at 09/20/16 1454 Last data filed at 09/20/16 1400  Gross per 24 hour  Intake          1525.65 ml  Output             2600 ml  Net         -1074.35 ml   LBM: Last BM Date: 09/18/16 Baseline Weight: Weight: 53.7 kg (118 lb 6.4 oz) Most recent weight: Weight: 60 kg (132 lb 4.4 oz)       Palliative Assessment/Data: PPS: 20%    Patient Active Problem List   Diagnosis Date Noted  . Palliative care by specialist   . Goals of care, counseling/discussion   . Advance care planning   . Cardiogenic shock (HCC) 08/30/2016  . Typical atrial flutter (HCC)   . Atrial fibrillation with RVR (HCC) 08/22/2016  . CAD (coronary artery disease) 01/15/2015  . Impetigo 01/15/2015  . AKI (acute kidney injury) (HCC) 01/15/2015  . Hyponatremia 01/15/2015  . Hypomagnesemia 01/15/2015  . Diabetes mellitus type 2, uncontrolled (HCC) 01/15/2015  . Acute on chronic systolic  (congestive) heart failure (HCC) 01/13/2015  . Chronic systolic CHF (congestive heart failure) (HCC) 05/05/2014  . Ischemic cardiomyopathy 05/05/2014  . COPD (chronic obstructive pulmonary disease) (HCC) 05/05/2014  . Encounter for therapeutic drug monitoring 04/28/2014  . LV (left ventricular) mural thrombus (HCC) 03/25/2014  . Rheumatoid factor positive 10/26/2013  . OSA (obstructive sleep apnea) 09/03/2013  . Pulmonary HTN (HCC) 09/03/2013  . PAD (peripheral artery disease) (HCC) 10/06/2012  . Chest pain 07/03/2012  . VT (ventricular tachycardia) (HCC) 12/20/2011  . Depression with anxiety 04/30/2011  . Rash 08/24/2010  . Deep vein thrombosis of lower leg (HCC) 06/28/2010  . HYPOPOTASSEMIA 12/24/2008  . HYPERCHOLESTEROLEMIA 07/15/2008  . NICOTINE ADDICTION 07/15/2008  . DYSPNEA 03/21/2008  . NONSPECIFIC LOW BP 03/21/2008  . ICD (implantable cardioverter-defibrillator) in place 03/21/2008    Palliative Care Assessment & Plan   Patient Profile: 56 y.o. female  with past medical history of chronic systolic heart failure, CAD, ICD, DM, hyperlipidemia, PAD, DVT, LV thrombus, implanted cardiac monitor admitted on 08/22/2016 with new onset of atrial fibrillation with RVR detected by her ICM and increasing dyspnea. During this admission she was started on milrinone for worsening HF and renal failure. She required CVVHD for AKI/CKI d/t cardiorenal syndrome, this was held on 6/30. There was some temporary improvement in kidney function, but BUN/Cr is again starting to climb. She is not candidate for IHD d/t severe CMP (EF 10%). She has been switched from milrinone to dobutamine, and is still requiring norepi to maintain BP. Overall very poor prognosis. Per Dr. Prescott Gum note patient is "actively dying of multi-organ failure". Palliative medicine consulted for GOC.   Assessment/Recommendations/Plan   Patient continuing to decline showing no improvement.   She notes she would want to go home  with Hospice to her daughter Candace's home, but  she needs to discuss this with Candace. She requests that I do not call her daughter to discuss, she would like to discuss with her tonight.  Family has not called to arrange follow up meeting time. Patient again requests that I do not call family.  I discussed concern with her that she may not survive this hospitalization. Discussed code status, however, she continues to desire full code.  Goals of Care and Additional Recommendations:  Limitations on Scope of Treatment: Full Scope Treatment  Code Status:  Full code  Prognosis:   Unable to determine I do not anticipate she will survive this hospitalization.  Discharge Planning:  To Be Determined  Care plan was discussed with patient.  Thank you for allowing the Palliative Medicine Team to assist in the care of this patient.   Total time: 25 mins  Greater than 50%  of this time was spent counseling and coordinating care related to the above assessment and plan.  Ocie Bob, AGNP-C Palliative Medicine   Please contact Palliative Medicine Team phone at [phone] for questions and concerns.

## 2016-09-20 NOTE — Care Management Note (Addendum)
Case Management Note  Patient Details  Name: Jennye MoccasinSherita Geffrard MRN: [id number] Date of Birth: 13-Apr-1960  Subjective/Objective:   Presents with new onset afib, worsening heart failure and kidney failure, conts on dobutamine, norepi, palliative consulted,family would like to cont aggressive care.  Plan home with Hospice  with daughter, but she has not discussed this with daughter yet and she does not want anyone to talk to daughter about this ,she will talk to daughter. .   Discussed in LOS, appropriate for continued stay.    7/13 1604 Letha Capeeborah Chelsia Serres RN, BSN - NCM spoke with patient and she states she will speak with her daughter this evening about home hospice.  NCM explained eliquis card to her and informed her will check with her tomorrow, regarding what she discussed with her daughter.      7/17 1324 Letha Capeeborah Melicia Esqueda RN, BSN - Heart failure and Renal Failure, conts on dobutamine drip , eliquis and CVP, co-ox down today 48 and cret is trending up .  She refused to see Palliative today per MD note.  Patient is having difficulty processing EOL decisions. Discussed in LOS , appropriate for continued stay.      7/20  1408 Letha Capeeborah Katricia Prehn RN, BSN - NCM spoke with patient's son [name] BameRonnie and Ethelene Brownsnthony and they state they will be able to have patient's room set up to receive DME on Sunday.  [facility] will have RN for Dobutamine set up on Monday, plan for dc on Monday. Lauro Regulusonnie, Anthony and daughter, Williemae Natterandice, MD and Pam with [facility] and [facility]  [place].    [id number] Letha Capeeborah Britten Seyfried RN, BSN - NCM received call that patient still needs oxygen and w/chair, NCM contacted Jermaine with [facility], he will deliver these items to patient room.  [facility] and palliative ( palliative has already been consulted per MD).  Jeri ModenaPam Chandler with [facility] will hook up Dobutamine at 1 pm, after patient is hooked up on dobutamine she can be discharged.       Action/Plan: NCM will follow for dc  needs.   Expected Discharge Date:                  Expected Discharge Plan:  Home w Home Health Services  In-House Referral:  NA  Discharge planning Services  CM Consult, Medication Assistance  Post Acute Care Choice:    Choice offered to:     DME Arranged:    DME Agency:     HH Arranged:    HH Agency:     Status of Service:  In process, will continue to follow  If discussed at Long Length of Stay Meetings, dates discussed:  6/19, 6/21, 6/26, 6/28, 7/3, 7/5, 7/12  Additional Comments:  Leone Havenaylor, Andre Swander Clinton, RN 09/20/2016, 10:42 AM

## 2016-09-20 NOTE — Progress Notes (Signed)
Patient ID: Jessica [name], female   DOB: 04/22/1960, 56 y.o.   MRN: [id number]   Advanced Heart Failure Rounding Note   Subjective:    Admitted with new onset atrial flutter and ADHF. Milrinone started 6/15 for worsening HF and renal failure.  S/P TEE and successful DC-CV.   Moved to ICU on 6/19 due to low output and worsening AKI. CVVHD catheter placed. CVVHD stopped 6/27. Resumed 6/29. Then stopped 6/30  Remains off CVVHD.    Met with Palliative Care with plans for full code.  Yesterday dobutamine increased to 8 mcg and continued on norepi 17 mcg.   Complaining of fatigue. Denies SOB. Creatinine has plateaued again at 4.2     Objective:   Weight Range:  Vital Signs:   Temp:  [97.7 F (36.5 C)-98.7 F (37.1 C)] 98.7 F (37.1 C) (07/12 0341) Pulse Rate:  [56-65] 58 (07/12 0700) Resp:  [12-22] 15 (07/12 0700) BP: (101-122)/(56-86) 117/65 (07/12 0700) SpO2:  [91 %-100 %] 99 % (07/12 0700) Weight:  [60 kg (132 lb 4.4 oz)] 60 kg (132 lb 4.4 oz) (07/12 0341) Last BM Date: 09/18/16  Weight change: Filed Weights   09/18/16 0500 09/19/16 0300 09/20/16 0341  Weight: 62.1 kg (136 lb 14.5 oz) 61.1 kg (134 lb 12.8 oz) 60 kg (132 lb 4.4 oz)    Intake/Output:   Intake/Output Summary (Last 24 hours) at 09/20/16 0719 Last data filed at 09/20/16 0700  Gross per 24 hour  Intake             1226 ml  Output             2350 ml  Net            -1124 ml     Physical Exam: cvp 19-20 General:  Appears fatigued. No resp difficulty. In bed.  HEENT: normal Neck: supple. JVP to jaw. Carotids 2+ bilat; no bruits. No lymphadenopathy or thryomegaly appreciated. Cor: PMI nondisplaced. Regular rate & rhythm. No rubs, gallops or murmurs. Lungs: clear Abdomen: soft, nontender, nondistended. No hepatosplenomegaly. No bruits or masses. Good bowel sounds. Extremities: no cyanosis, clubbing, rash, R and LLE 1+ edema Neuro: alert & orientedx3, cranial nerves grossly intact. moves all 4  extremities w/o difficulty. Affect flat      Telemetry: NSR 60s  Personally reviewed.      Labs: Basic Metabolic Panel:  Recent Labs Lab 09/14/16 0451  09/15/16 4166 09/15/16 1654 09/16/16 0456 09/16/16 1558 09/17/16 0500 09/18/16 0937 09/19/16 0414 09/20/16 0339  NA 121*  < > 124* 125* 122* 121* 123* 119* 120* 122*  K 4.0  < > 4.2 4.3 4.5 4.6 5.0 5.1 4.6 4.2  CL 89*  < > 92* 91* 92* 91* 92* 89* 88* 87*  CO2 21*  < > 22 21* 20* 21* 21* 19* 20* 22  GLUCOSE 249*  < > 152* 157* 136* 183* 127* 239* 221* 159*  BUN 67*  < > 69* 66* 70* 68* 78* 85* 90* 88*  CREATININE 4.40*  < > 4.04* 3.87* 3.84* 3.82* 4.21* 4.31* 4.21* 4.23*  CALCIUM 9.2  < > 9.3 9.2 9.2 9.1 9.2 9.3 9.5 9.4  MG 2.2  --   --   --   --   --   --   --   --   --   PHOS 6.0*  < > 5.8* 5.6* 5.4* 5.5* 6.2*  --   --   --   < > = values in this  interval not displayed.  Liver Function Tests:  Recent Labs Lab 09/15/16 0513 09/15/16 1654 09/16/16 0456 09/16/16 1558 09/17/16 0500  ALBUMIN 3.0* 2.9* 3.0* 3.0* 2.9*   No results for input(s): LIPASE, AMYLASE in the last 168 hours. No results for input(s): AMMONIA in the last 168 hours.  CBC:  Recent Labs Lab 09/15/16 0513 09/16/16 0456 09/17/16 0500 09/18/16 0306 09/20/16 0339  WBC 4.9 5.9 4.4 5.2 6.9  HGB 8.8* 8.6* 8.4* 8.2* 9.0*  HCT 25.9* 25.3* 24.9* 24.2* 26.6*  MCV 80.9 81.6 83.3 82.3 81.6  PLT 175 171 164 173 180    Cardiac Enzymes: No results for input(s): CKTOTAL, CKMB, CKMBINDEX, TROPONINI in the last 168 hours.  BNP: BNP (last 3 results)  Recent Labs  09/28/15 1114  BNP 871.3*    ProBNP (last 3 results) No results for input(s): PROBNP in the last 8760 hours.    Other results:  Imaging: No results found.   Medications:     Scheduled Medications: . amiodarone  200 mg Oral Daily  . aspirin EC  81 mg Oral Daily  . Chlorhexidine Gluconate Cloth  6 each Topical Q0600  . insulin aspart  0-9 Units Subcutaneous TID WC  .  insulin aspart  2 Units Subcutaneous TID WC  . insulin glargine  10 Units Subcutaneous Daily  . pravastatin  20 mg Oral q1800  . sodium chloride flush  10-40 mL Intracatheter Q12H    Infusions: . sodium chloride 10 mL/hr at 09/20/16 0700  . DOBUTamine 8 mcg/kg/min (09/20/16 0700)  . furosemide Stopped (09/19/16 1902)  . heparin 1,300 Units/hr (09/20/16 0700)  . norepinephrine (LEVOPHED) Adult infusion 17 mcg/min (09/20/16 0700)    PRN Medications: acetaminophen, albuterol, chlorpheniramine-HYDROcodone, docusate sodium, hydrOXYzine, mineral oil-hydrophilic petrolatum, ondansetron (ZOFRAN) IV, sodium chloride flush   Patient Profile:   Jessica [name] is a 57 year old with h/o chronic systolic heart failure, ICM, Biotronik ICD, and PAH admitted new onset AFL and cardiogenic shock (EF 10%). Developed AKI requiring CVVHD.  Assessment/Plan/Discussion:    1. A/C Systolic Biventricular Heart Failure -> cardiogenic shock:  - Ischemic cardiomyopathy.  Echo 08/24/16. LVEF 10% severe RV HK (EF down further in setting of AFL).   -Milrinone stopped and switched to dobutamine. Now on 8 mcg dobutamine + norepi 17 mcg.    Todays CO-OX is 54%. - Continue IV lasix 160 mg twice a day. CVP 20.  - No bb or ACE/ARB/ARNI with low output and AKI .   2. A flutter RVR:  - New onset June 11 with device interrogation. S/P TEE DC-CV 6/18 with conversion to NSR.  - Remains in NSR on amio 200 mg daily. HR stable in 50-60s  - Continue heparin drip. (Was on Eliquis at home)  - Discussed with PharmD.  3. Acute on chronic renal failure (baseline CKD 3) due to cardio-renal syndrome.  - CVVHD started 6/20. Restarted 6/29. Held 09/09/16 with low volume status.   Creatinine unchanged.  Making over 2 liters of urine.  Per nephrology she is not a candidate for iHD or restarting CVVHD. They have signed off.  - Keep SBP > 110  4. PAH - Off  sildenafil with low BP.   .  5. CAD S/P multiple interventions.  - No s/s ischemia  Continue 81 mg aspirin. Continue pravastatin.   - No CP   6. PAD:  -occluded left SFA with mild/mod abnormal ABIs. No revascularization was needed October 2017. No change.    7. H/O LV Thrombus: no  thrombus on TEE. Continue heparin for A flutter. No change.   8. Hyponatremia:  Sodium 122.  Continue free water restriction. Received tolvaptan 7/10  9. Deconditioning - PT following. Recommendations for HHPT  10. ID/UTI - Completed Rocephin for UTI on 7/7.    11. DM2 - Stable.  Continue SSI.   Palliative Care appreciated. Plan for full code.    Darrick Grinder, NP  09/20/2016 7:19 AM  Advanced Heart Failure Team Pager [phone] (M-F; [place])  Please contact [facility] for night-coverage after hours (4p -7a ) and weekends on [email or web address]  .Agree with above.  She remains critically ill.   Co-ox marginal despite dual pressors. Creatinine leveled off at 4.2. CVP 20. I will max dobutamine at 10 and then begin to wean NE tomorrow. If that fails she will be out of options for any meaningful way yo get her home. We will not restart dialysis as she will not tolerate HD. Will add metolazone today. She wants to keep fighting but seems to be beginning to realize that there may not be any options left.   On exam  Fatigued. JVP to ear Cor RRR +s3 Lungs decreased at bases Extremities cool with mild edema  As above. Will max dobutamine. Begin wean of NE tomorrow.   CRITICAL CARE Performed by: Glori Bickers  Total critical care time: 35 minutes  Critical care time was exclusive of separately billable procedures and treating other patients.  Critical care was necessary to treat or prevent imminent or life-threatening deterioration.  Critical care was time spent personally by me (independent of midlevel providers or residents) on the following activities: development of treatment plan with patient and/or surrogate as well as nursing, discussions with consultants, evaluation of patient's  response to treatment, examination of patient, obtaining history from patient or surrogate, ordering and performing treatments and interventions, ordering and review of laboratory studies, ordering and review of radiographic studies, pulse oximetry and re-evaluation of patient's condition.    Glori Bickers, MD  8:11 AM

## 2016-09-20 NOTE — Progress Notes (Signed)
ANTICOAGULATION CONSULT NOTE - Follow Up Consult  Pharmacy Consult for Heparin Indication: atrial fibrillation  Patient Measurements: Height: 5\' 5"  (165.1 cm) Weight: 132 lb 4.4 oz (60 kg) IBW/kg (Calculated) : 57  Vital Signs: Temp: 98.7 F (37.1 C) (07/12 0341) Temp Source: Oral (07/12 0341) BP: 117/65 (07/12 0700) Pulse Rate: 58 (07/12 0700)  Labs:  Recent Labs  09/18/16 0306 09/18/16 0937 09/19/16 0414 09/20/16 0339  HGB 8.2*  --   --  9.0*  HCT 24.2*  --   --  26.6*  PLT 173  --   --  180  HEPARINUNFRC 0.41  --  0.36 0.37  CREATININE  --  4.31* 4.21* 4.23*    Estimated Creatinine Clearance: 13 mL/min (A) (by C-G formula based on SCr of 4.23 mg/dL (H)).  Medications: Heparin @ 1300 units/hr  Assessment: 56yof previously on eliquis for afib, s/p DCCV on 6/18, transitioned to IV heparin 6/20 anticipating CVVHD. CVVHD stopped 6/27, temp IJ catheter placed 6/28, heparin resumed, and CVVHD resumed 6/29. CRRT stopped again 7/1.   Heparin level remains therapeutic 0.37. CBC stable. No bleeding.  Goal of Therapy:  Heparin level 0.3-0.7 units/ml Monitor platelets by anticoagulation protocol: Yes   Plan:  1) Continue heparin at 1300 units/hr 2) Daily heparin level and CBC 3) Follow up plan for oral anticoagulation eventually.  Louie CasaJennifer Rozetta Stumpp PharmD., BCPS 09/20/2016 7:31 AM

## 2016-09-21 LAB — CBC
HCT: 25.8 % — ABNORMAL LOW (ref 36.0–46.0)
Hemoglobin: 8.9 g/dL — ABNORMAL LOW (ref 12.0–15.0)
MCH: 28.2 pg (ref 26.0–34.0)
MCHC: 34.5 g/dL (ref 30.0–36.0)
MCV: 81.6 fL (ref 78.0–100.0)
Platelets: 177 10*3/uL (ref 150–400)
RBC: 3.16 MIL/uL — AB (ref 3.87–5.11)
RDW: 16.2 % — ABNORMAL HIGH (ref 11.5–15.5)
WBC: 5.4 10*3/uL (ref 4.0–10.5)

## 2016-09-21 LAB — BASIC METABOLIC PANEL
ANION GAP: 12 (ref 5–15)
BUN: 84 mg/dL — ABNORMAL HIGH (ref 6–20)
CALCIUM: 9.5 mg/dL (ref 8.9–10.3)
CO2: 23 mmol/L (ref 22–32)
Chloride: 87 mmol/L — ABNORMAL LOW (ref 101–111)
Creatinine, Ser: 4.13 mg/dL — ABNORMAL HIGH (ref 0.44–1.00)
GFR, EST AFRICAN AMERICAN: 13 mL/min — AB (ref >60)
GFR, EST NON AFRICAN AMERICAN: 11 mL/min — AB (ref >60)
Glucose, Bld: 143 mg/dL — ABNORMAL HIGH (ref 65–99)
Potassium: 4.1 mmol/L (ref 3.5–5.1)
Sodium: 122 mmol/L — ABNORMAL LOW (ref 135–145)

## 2016-09-21 LAB — GLUCOSE, CAPILLARY
GLUCOSE-CAPILLARY: 158 mg/dL — AB (ref 65–99)
GLUCOSE-CAPILLARY: 154 mg/dL — AB (ref 65–99)
Glucose-Capillary: 94 mg/dL (ref 65–99)
Glucose-Capillary: 90 mg/dL (ref 65–99)

## 2016-09-21 LAB — COOXEMETRY PANEL
Carboxyhemoglobin: 1.9 % — ABNORMAL HIGH (ref 0.5–1.5)
METHEMOGLOBIN: 1.3 % (ref 0.0–1.5)
O2 Saturation: 52.5 %
Total hemoglobin: 8.5 g/dL — ABNORMAL LOW (ref 12.0–16.0)

## 2016-09-21 LAB — HEPARIN LEVEL (UNFRACTIONATED): HEPARIN UNFRACTIONATED: 0.51 [IU]/mL (ref 0.30–0.70)

## 2016-09-21 MED ORDER — METOLAZONE 5 MG PO TABS
5.0000 mg | ORAL_TABLET | Freq: Once | ORAL | Status: AC
  Administered 2016-09-21: 5 mg via ORAL
  Filled 2016-09-21: qty 1

## 2016-09-21 MED ORDER — POLYETHYLENE GLYCOL 3350 17 G PO PACK
17.0000 g | PACK | Freq: Every day | ORAL | Status: DC
  Administered 2016-09-21 – 2016-09-30 (×2): 17 g via ORAL
  Filled 2016-09-21 (×5): qty 1

## 2016-09-21 MED ORDER — SENNA 8.6 MG PO TABS
2.0000 | ORAL_TABLET | Freq: Two times a day (BID) | ORAL | Status: DC
  Administered 2016-09-21 – 2016-10-01 (×19): 17.2 mg via ORAL
  Filled 2016-09-21 (×20): qty 2

## 2016-09-21 NOTE — Progress Notes (Addendum)
Daily Progress Note   Patient Name: Jessica [name]       Date: 09/21/2016 DOB: [birth year]  Age: 56 y.o. MRN#: [id number] Attending Physician: Dolores PattyBensimhon, Daniel R, MD Primary Care Physician: Patient, No Pcp Per Admit Date: 08/22/2016  Reason for Consultation/Follow-up: Establishing goals of care  Subjective:  Pt sitting up in chair today. Per RN she is tolerating weaning off norepi. She states she is feeling well, not struggling to breathe. Her lower extremities are with edema. C/o constipation with no BM x 3 days. Denies N/V, pain. She continues to be hopeful to wean off norepinephrine and possible go home with dobutamine. Declines further discussion today.     Review of Systems  Respiratory: Negative for shortness of breath.   Cardiovascular: Positive for leg swelling. Negative for chest pain.  Neurological: Positive for weakness.    Length of Stay: 30  Current Medications: Scheduled Meds:  . amiodarone  200 mg Oral Daily  . aspirin EC  81 mg Oral Daily  . Chlorhexidine Gluconate Cloth  6 each Topical Q0600  . insulin aspart  0-9 Units Subcutaneous TID WC  . insulin aspart  2 Units Subcutaneous TID WC  . insulin glargine  10 Units Subcutaneous Daily  . polyethylene glycol  17 g Oral Daily  . pravastatin  20 mg Oral q1800  . senna  2 tablet Oral BID  . sodium chloride flush  10-40 mL Intracatheter Q12H    Continuous Infusions: . sodium chloride 10 mL/hr at 09/21/16 0700  . DOBUTamine 10 mcg/kg/min (09/21/16 0800)  . furosemide Stopped (09/21/16 0851)  . heparin 1,300 Units/hr (09/21/16 0800)  . norepinephrine (LEVOPHED) Adult infusion 8 mcg/min (09/21/16 1200)    PRN Meds: acetaminophen, albuterol, chlorpheniramine-HYDROcodone, docusate sodium, hydrOXYzine, mineral  oil-hydrophilic petrolatum, ondansetron (ZOFRAN) IV, sodium chloride flush  Physical Exam  Constitutional:  Cachetic, appears ill  Musculoskeletal: She exhibits edema.  Neurological:  Somnolent, arouses easily, oriented to person and place  Psychiatric: Her behavior is normal. Judgment and thought content normal.  Flat affect  Nursing note and vitals reviewed.           Vital Signs: BP 111/60   Pulse (!) 57   Temp 98 F (36.7 C) (Oral)   Resp 13   Ht 5\' 5"  (1.651 m)   Wt 59.2 kg (130 lb  8.2 oz)   SpO2 97%   BMI 21.72 kg/m  SpO2: SpO2: 97 % O2 Device: O2 Device: Nasal Cannula O2 Flow Rate: O2 Flow Rate (L/min): 3 L/min  Intake/output summary:   Intake/Output Summary (Last 24 hours) at 09/21/16 1320 Last data filed at 09/21/16 1200  Gross per 24 hour  Intake          1320.79 ml  Output             2485 ml  Net         -1164.21 ml   LBM: Last BM Date: 09/18/16 Baseline Weight: Weight: 53.7 kg (118 lb 6.4 oz) Most recent weight: Weight: 59.2 kg (130 lb 8.2 oz)       Palliative Assessment/Data: PPS: 20%    Patient Active Problem List   Diagnosis Date Noted  . Palliative care by specialist   . Goals of care, counseling/discussion   . Advance care planning   . Cardiogenic shock (HCC) 08/30/2016  . Typical atrial flutter (HCC)   . Atrial fibrillation with RVR (HCC) 08/22/2016  . CAD (coronary artery disease) 01/15/2015  . Impetigo 01/15/2015  . AKI (acute kidney injury) (HCC) 01/15/2015  . Hyponatremia 01/15/2015  . Hypomagnesemia 01/15/2015  . Diabetes mellitus type 2, uncontrolled (HCC) 01/15/2015  . Acute on chronic systolic (congestive) heart failure (HCC) 01/13/2015  . Chronic systolic CHF (congestive heart failure) (HCC) 05/05/2014  . Ischemic cardiomyopathy 05/05/2014  . COPD (chronic obstructive pulmonary disease) (HCC) 05/05/2014  . Encounter for therapeutic drug monitoring 04/28/2014  . LV (left ventricular) mural thrombus (HCC) 03/25/2014  .  Rheumatoid factor positive 10/26/2013  . OSA (obstructive sleep apnea) 09/03/2013  . Pulmonary HTN (HCC) 09/03/2013  . PAD (peripheral artery disease) (HCC) 10/06/2012  . Chest pain 07/03/2012  . VT (ventricular tachycardia) (HCC) 12/20/2011  . Depression with anxiety 04/30/2011  . Rash 08/24/2010  . Deep vein thrombosis of lower leg (HCC) 06/28/2010  . HYPOPOTASSEMIA 12/24/2008  . HYPERCHOLESTEROLEMIA 07/15/2008  . NICOTINE ADDICTION 07/15/2008  . DYSPNEA 03/21/2008  . NONSPECIFIC LOW BP 03/21/2008  . ICD (implantable cardioverter-defibrillator) in place 03/21/2008    Palliative Care Assessment & Plan   Patient Profile: 56 y.o. female  with past medical history of chronic systolic heart failure, CAD, ICD, DM, hyperlipidemia, PAD, DVT, LV thrombus, implanted cardiac monitor admitted on 08/22/2016 with new onset of atrial fibrillation with RVR detected by her ICM and increasing dyspnea. During this admission she was started on milrinone for worsening HF and renal failure. She required CVVHD for AKI/CKI d/t cardiorenal syndrome, this was held on 6/30. There was some temporary improvement in kidney function, but BUN/Cr is again starting to climb. She is not candidate for IHD d/t severe CMP (EF 10%). She has been switched from milrinone to dobutamine, and is still requiring norepi to maintain BP. Overall very poor prognosis. Per Dr. Prescott Gum note patient is "actively dying of multi-organ failure". Palliative medicine consulted for GOC.   Assessment/Recommendations/Plan   Senna 2 tab po bid, miralax 17gm daily for constipation  PMT will continue to follow and support holistically  Will monitor for progress/decline and intervene as necessary  Goals of Care and Additional Recommendations:  Limitations on Scope of Treatment: Full Scope Treatment  Code Status:  Full code  Prognosis:   Unable to determine  Discharge Planning:  To Be Determined  Care plan was discussed with  patient.  Thank you for allowing the Palliative Medicine Team to assist in the care of this  patient.   Total time: 25 mins  Greater than 50%  of this time was spent counseling and coordinating care related to the above assessment and plan.  Ocie Bob, AGNP-C Palliative Medicine   Please contact Palliative Medicine Team phone at [phone] for questions and concerns.

## 2016-09-21 NOTE — Progress Notes (Signed)
ANTICOAGULATION CONSULT NOTE - Follow Up Consult  Pharmacy Consult for Heparin Indication: atrial fibrillation  Patient Measurements: Height: 5\' 5"  (165.1 cm) Weight: 130 lb 8.2 oz (59.2 kg) IBW/kg (Calculated) : 57  Vital Signs: Temp: 98.4 F (36.9 C) (07/13 0353) Temp Source: Oral (07/12 1936) BP: 112/65 (07/13 0700) Pulse Rate: 57 (07/13 0700)  Labs:  Recent Labs  09/19/16 0414 09/20/16 0339 09/21/16 0221  HGB  --  9.0* 8.9*  HCT  --  26.6* 25.8*  PLT  --  180 177  HEPARINUNFRC 0.36 0.37 0.51  CREATININE 4.21* 4.23* 4.13*    Estimated Creatinine Clearance: 13.4 mL/min (A) (by C-G formula based on SCr of 4.13 mg/dL (H)).  Medications: Heparin @ 1300 units/hr  Assessment: 56yof previously on eliquis for afib, s/p DCCV on 6/18, transitioned to IV heparin 6/20 anticipating CVVHD. CVVHD stopped 6/27, temp IJ catheter placed 6/28, heparin resumed, and CVVHD resumed 6/29. CRRT stopped again 7/1.   Heparin level remains therapeutic 0.51. CBC stable. No bleeding.  Goal of Therapy:  Heparin level 0.3-0.7 units/ml Monitor platelets by anticoagulation protocol: Yes   Plan:  1) Continue heparin at 1300 units/hr 2) Daily heparin level and CBC 3) Follow up plan for oral anticoagulation eventually.  Louie CasaJennifer Kairen Hallinan PharmD., BCPS 09/21/2016 7:25 AM

## 2016-09-21 NOTE — Progress Notes (Signed)
Patient ID: Jessica [name], female   DOB: [birth year], 58 y.o.   MRN: [id number]   Advanced Heart Failure Rounding Note   Subjective:    Admitted with new onset atrial flutter and ADHF. Milrinone started 6/15 for worsening HF and renal failure.  S/P TEE and successful DC-CV.   Moved to ICU on 6/19 due to low output and worsening AKI. CVVHD catheter placed. CVVHD stopped 6/27. Resumed 6/29. Then stopped 6/30  Remains off CVVHD.    Met with Palliative Care with plans for full code.  Yesterday dobutamine was increased to 10 mcg and she continues on norepi 16 mcg. Yesterday diuresed with IV lasix + metolazone. Increased urine output. Todays Co-OX is 52.5%.   Complains of fatigue. Says she is trying to get things in order with her children.   Objective:   Weight Range:  Vital Signs:   Temp:  [97.6 F (36.4 C)-98.8 F (37.1 C)] 98.4 F (36.9 C) (07/13 0353) Pulse Rate:  [57-64] 60 (07/13 0630) Resp:  [11-20] 20 (07/13 0630) BP: (98-121)/(49-79) 119/49 (07/13 0630) SpO2:  [92 %-100 %] 98 % (07/13 0630) Weight:  [130 lb 8.2 oz (59.2 kg)] 130 lb 8.2 oz (59.2 kg) (07/13 0500) Last BM Date: 09/18/16  Weight change: Filed Weights   09/19/16 0300 09/20/16 0341 09/21/16 0500  Weight: 134 lb 12.8 oz (61.1 kg) 132 lb 4.4 oz (60 kg) 130 lb 8.2 oz (59.2 kg)    Intake/Output:   Intake/Output Summary (Last 24 hours) at 09/21/16 0708 Last data filed at 09/21/16 0600  Gross per 24 hour  Intake          1277.93 ml  Output             2935 ml  Net         -1657.07 ml     Physical Exam: CVP 14  General: Appears fatigued. No resp difficulty. Sitting on the side of the bed.  HEENT: normal Neck: supple. no JVD. Carotids 2+ bilat; no bruits. No lymphadenopathy or thryomegaly appreciated. Cor: PMI nondisplaced. Regular rate & rhythm. No rubs, gallops or murmurs. Lungs: clear Abdomen: soft, nontender, nondistended. No hepatosplenomegaly. No bruits or masses. Good bowel sounds. Extremities:  no cyanosis, clubbing, rash, edema Neuro: alert & orientedx3, cranial nerves grossly intact. moves all 4 extremities w/o difficulty. Affect pleasant    Telemetry: NSR 60s  Personally reviewed.      Labs: Basic Metabolic Panel:  Recent Labs Lab 09/15/16 0513 09/15/16 1654 09/16/16 0456 09/16/16 1558 09/17/16 0500 09/18/16 0937 09/19/16 0414 09/20/16 0339 09/21/16 0221  NA 124* 125* 122* 121* 123* 119* 120* 122* 122*  K 4.2 4.3 4.5 4.6 5.0 5.1 4.6 4.2 4.1  CL 92* 91* 92* 91* 92* 89* 88* 87* 87*  CO2 22 21* 20* 21* 21* 19* 20* 22 23  GLUCOSE 152* 157* 136* 183* 127* 239* 221* 159* 143*  BUN 69* 66* 70* 68* 78* 85* 90* 88* 84*  CREATININE 4.04* 3.87* 3.84* 3.82* 4.21* 4.31* 4.21* 4.23* 4.13*  CALCIUM 9.3 9.2 9.2 9.1 9.2 9.3 9.5 9.4 9.5  PHOS 5.8* 5.6* 5.4* 5.5* 6.2*  --   --   --   --     Liver Function Tests:  Recent Labs Lab 09/15/16 0513 09/15/16 1654 09/16/16 0456 09/16/16 1558 09/17/16 0500  ALBUMIN 3.0* 2.9* 3.0* 3.0* 2.9*   No results for input(s): LIPASE, AMYLASE in the last 168 hours. No results for input(s): AMMONIA in the last 168 hours.  CBC:  Recent Labs Lab 09/16/16 0456 09/17/16 0500 09/18/16 0306 09/20/16 0339 09/21/16 0221  WBC 5.9 4.4 5.2 6.9 5.4  HGB 8.6* 8.4* 8.2* 9.0* 8.9*  HCT 25.3* 24.9* 24.2* 26.6* 25.8*  MCV 81.6 83.3 82.3 81.6 81.6  PLT 171 164 173 180 177    Cardiac Enzymes: No results for input(s): CKTOTAL, CKMB, CKMBINDEX, TROPONINI in the last 168 hours.  BNP: BNP (last 3 results)  Recent Labs  09/28/15 1114  BNP 871.3*    ProBNP (last 3 results) No results for input(s): PROBNP in the last 8760 hours.    Other results:  Imaging: No results found.   Medications:     Scheduled Medications: . amiodarone  200 mg Oral Daily  . aspirin EC  81 mg Oral Daily  . Chlorhexidine Gluconate Cloth  6 each Topical Q0600  . insulin aspart  0-9 Units Subcutaneous TID WC  . insulin aspart  2 Units Subcutaneous TID WC    . insulin glargine  10 Units Subcutaneous Daily  . pravastatin  20 mg Oral q1800  . sodium chloride flush  10-40 mL Intracatheter Q12H    Infusions: . sodium chloride 10 mL/hr at 09/21/16 0300  . DOBUTamine 10 mcg/kg/min (09/21/16 0300)  . furosemide Stopped (09/20/16 1939)  . heparin 1,300 Units/hr (09/21/16 0413)  . norepinephrine (LEVOPHED) Adult infusion 16 mcg/min (09/21/16 0300)    PRN Medications: acetaminophen, albuterol, chlorpheniramine-HYDROcodone, docusate sodium, hydrOXYzine, mineral oil-hydrophilic petrolatum, ondansetron (ZOFRAN) IV, sodium chloride flush   Patient Profile:   Shirin is a 56 year old with h/o chronic systolic heart failure, ICM, Biotronik ICD, and PAH admitted new onset AFL and cardiogenic shock (EF 10%). Developed AKI requiring CVVHD.  Assessment/Plan/Discussion:    1. A/C Systolic Biventricular Heart Failure -> cardiogenic shock:  - Ischemic cardiomyopathy.  Echo 08/24/16. LVEF 10% severe RV HK (EF down further in setting of AFL).   -Milrinone stopped and switched to dobutamine.  Todays CO-OX is 52.5%. Continue dobutamine 10 mcg + norepi 16 mcg. Try to wean.   Continue IV lasix 160 mg twice a day + 5 mg metolazone once daily. Renal function about the same.   - No bb or ACE/ARB/ARNI with low output and AKI .   2. A flutter RVR:  - New onset June 11 with device interrogation. S/P TEE DC-CV 6/18 with conversion to NSR.  - In NSR. Continue amio. HR stable in 50-60s  - Continue heparin drip. (Was on Eliquis at home)  - Discussed with PharmD.  3. Acute on chronic renal failure (baseline CKD 3) due to cardio-renal syndrome.  - CVVHD started 6/20. Restarted 6/29. Held 09/09/16 with low volume status.   Per nephrology she is not a candidate for iHD or restarting CVVHD. They have signed off.  -Creatinine 4.13. Increased urine output.  - Keep SBP > 110  4. PAH - Off  sildenafil with low BP.   .  5. CAD S/P multiple interventions.  - No s/s  ischemia. Continue 81 mg aspirin. Continue pravastatin.     6. PAD:  -occluded left SFA with mild/mod abnormal ABIs. No revascularization was needed October 2017. No change.    7. H/O LV Thrombus: no thrombus on TEE. Continue heparin for A flutter. No change.   8. Hyponatremia:  Sodium 122.  Continue free water restriction. Received tolvaptan 7/10  9. Deconditioning - PT following. Recommendations for HHPT  10. ID/UTI - Completed Rocephin for UTI on 7/7.    11. DM2 - Stable.  Continue  SSI.   Palliative Care appreciated. Plan for full code. She continues to struggle with end of life decisions.    Darrick Grinder, NP  09/21/2016 7:08 AM  Advanced Heart Failure Team Pager [phone] (M-F; [place])  Please contact [facility] for night-coverage after hours (4p -7a ) and weekends on [email or web address] Agree.  She remains critically ill requiring 2 high dose vasopressors/intoropes. Now maxed on dobutamine but co-ox marginal at 52%. Creatinine stable.   On exam fatigued Remains in NSR RRR +s3 JVP up  Lungs crackles at bases Mild edema  We are out of options. We will begin to wean NE down to keep SBP > 80. If fails NE wean will need to shift to full comfort care.   CRITICAL CARE Performed by: Glori Bickers  Total critical care time: 35 minutes  Critical care time was exclusive of separately billable procedures and treating other patients.  Critical care was necessary to treat or prevent imminent or life-threatening deterioration.  Critical care was time spent personally by me (independent of midlevel providers or residents) on the following activities: development of treatment plan with patient and/or surrogate as well as nursing, discussions with consultants, evaluation of patient's response to treatment, examination of patient, obtaining history from patient or surrogate, ordering and performing treatments and interventions, ordering and review of laboratory studies, ordering and  review of radiographic studies, pulse oximetry and re-evaluation of patient's condition.  Glori Bickers, MD  10:40 AM   .

## 2016-09-21 NOTE — Progress Notes (Signed)
Per Dr. Gala RomneyBensimhon, titrate levophed down as tolerated, keep SBP>80.

## 2016-09-22 LAB — GLUCOSE, CAPILLARY
GLUCOSE-CAPILLARY: 79 mg/dL (ref 65–99)
GLUCOSE-CAPILLARY: 81 mg/dL (ref 65–99)
GLUCOSE-CAPILLARY: 77 mg/dL (ref 65–99)
GLUCOSE-CAPILLARY: 77 mg/dL (ref 65–99)
Glucose-Capillary: 69 mg/dL (ref 65–99)

## 2016-09-22 LAB — CBC
HEMATOCRIT: 24 % — AB (ref 36.0–46.0)
HEMOGLOBIN: 8.1 g/dL — AB (ref 12.0–15.0)
MCH: 27.6 pg (ref 26.0–34.0)
MCHC: 33.8 g/dL (ref 30.0–36.0)
MCV: 81.6 fL (ref 78.0–100.0)
Platelets: 144 10*3/uL — ABNORMAL LOW (ref 150–400)
RBC: 2.94 MIL/uL — ABNORMAL LOW (ref 3.87–5.11)
RDW: 16.1 % — ABNORMAL HIGH (ref 11.5–15.5)
WBC: 4.2 10*3/uL (ref 4.0–10.5)

## 2016-09-22 LAB — BASIC METABOLIC PANEL
ANION GAP: 12 (ref 5–15)
BUN: 85 mg/dL — ABNORMAL HIGH (ref 6–20)
CALCIUM: 9.4 mg/dL (ref 8.9–10.3)
CHLORIDE: 87 mmol/L — AB (ref 101–111)
CO2: 25 mmol/L (ref 22–32)
CREATININE: 3.98 mg/dL — AB (ref 0.44–1.00)
GFR calc non Af Amer: 11 mL/min — ABNORMAL LOW (ref >60)
GFR, EST AFRICAN AMERICAN: 13 mL/min — AB (ref >60)
Glucose, Bld: 74 mg/dL (ref 65–99)
Potassium: 3.8 mmol/L (ref 3.5–5.1)
SODIUM: 124 mmol/L — AB (ref 135–145)

## 2016-09-22 LAB — HEPARIN LEVEL (UNFRACTIONATED): HEPARIN UNFRACTIONATED: 0.64 [IU]/mL (ref 0.30–0.70)

## 2016-09-22 LAB — COOXEMETRY PANEL
Carboxyhemoglobin: 1.6 % — ABNORMAL HIGH (ref 0.5–1.5)
METHEMOGLOBIN: 1.2 % (ref 0.0–1.5)
O2 Saturation: 44.2 %
TOTAL HEMOGLOBIN: 11 g/dL — AB (ref 12.0–16.0)

## 2016-09-22 MED ORDER — CHLORHEXIDINE GLUCONATE CLOTH 2 % EX PADS
6.0000 | MEDICATED_PAD | Freq: Every day | CUTANEOUS | Status: DC
  Administered 2016-09-23 – 2016-10-01 (×8): 6 via TOPICAL

## 2016-09-22 NOTE — Progress Notes (Addendum)
ANTICOAGULATION CONSULT NOTE - Follow Up Consult  Pharmacy Consult for Heparin Indication: atrial fibrillation  Patient Measurements: Height: 5\' 5"  (165.1 cm) Weight: 132 lb 4.4 oz (60 kg) IBW/kg (Calculated) : 57  Vital Signs: Temp: 97.6 F (36.4 C) (07/14 0403) Temp Source: Oral (07/14 0403) BP: 99/52 (07/14 0700) Pulse Rate: 57 (07/14 0700)  Labs:  Recent Labs  09/20/16 0339 09/21/16 0221 09/22/16 0342  HGB 9.0* 8.9* 8.1*  HCT 26.6* 25.8* 24.0*  PLT 180 177 144*  HEPARINUNFRC 0.37 0.51 0.64  CREATININE 4.23* 4.13* 3.98*    Estimated Creatinine Clearance: 13.9 mL/min (A) (by C-G formula based on SCr of 3.98 mg/dL (H)).  Medications: Heparin @ 1300 units/hr  Assessment: 56yoF previously on eliquis for afib, s/p DCCV on 6/18, transitioned to IV heparin 6/20 anticipating CVVHD. CVVHD stopped 6/27, temp IJ catheter placed 6/28, heparin resumed, and CVVHD resumed 6/29. CRRT stopped again 7/1.   Heparin level remains therapeutic 0.64. No S/Sx bleeding noted, Hgb and pltc down slightly - will follow closely.  Goal of Therapy:  Heparin level 0.3-0.7 units/ml Monitor platelets by anticoagulation protocol: Yes   Plan:  -Continue heparin at 1300 units/hr -Monitor heparin level, CBC, S/Sx bleeding daily -F/U long-term anticoagulation plans   Fredonia HighlandMichael Bitonti, PharmD PGY-2 Cardiology Pharmacy Resident Pager: [phone] 09/22/2016

## 2016-09-22 NOTE — Progress Notes (Signed)
Patient ID: Jessica [name], female   DOB: November 14, 1960, 56 y.o.   MRN: [id number]   Advanced Heart Failure Rounding Note   Subjective:    Admitted with new onset atrial flutter and ADHF. Milrinone started 6/15 for worsening HF and renal failure.  S/P TEE and successful DC-CV.   Moved to ICU on 6/19 due to low output and worsening AKI. CVVHD catheter placed. CVVHD stopped 6/27. Resumed 6/29. Then stopped 6/30  Remains off CVVHD.   Remains om dobutamine on 10 mcg Norepi weaned to off. SBP  95-105.   Feels ok but co-ox down to 45%. Weight up 2 pounds. CVP 11. Creatinine down slightly to 3.8   Objective:   Weight Range:  Vital Signs:   Temp:  [97.5 F (36.4 C)-97.9 F (36.6 C)] 97.5 F (36.4 C) (07/14 1235) Pulse Rate:  [50-106] 54 (07/14 1200) Resp:  [10-31] 13 (07/14 1200) BP: (85-109)/(44-69) 97/55 (07/14 1200) SpO2:  [91 %-100 %] 95 % (07/14 1200) Weight:  [60 kg (132 lb 4.4 oz)] 60 kg (132 lb 4.4 oz) (07/14 0500) Last BM Date: 09/22/16  Weight change: Filed Weights   09/20/16 0341 09/21/16 0500 09/22/16 0500  Weight: 60 kg (132 lb 4.4 oz) 59.2 kg (130 lb 8.2 oz) 60 kg (132 lb 4.4 oz)    Intake/Output:   Intake/Output Summary (Last 24 hours) at 09/22/16 1245 Last data filed at 09/22/16 1200  Gross per 24 hour  Intake          1367.48 ml  Output             2350 ml  Net          -982.52 ml     Physical Exam: CVP 11 General: Appears fatigued. No resp difficulty. Sitting in chair HEENT: normal Except for poor dentition Neck: supple. JVD to jaw. Carotids 2+ bilat; no bruits. No lymphadenopathy or thryomegaly appreciated. Cor: Regular rate and rhythm PMI laterally displaced 2/6 TR murmur Lungs: clear minimal basilar crackles Abdomen: Soft, nontender, nondistended. No hepatosplenomegaly. No bruits or masses. Good bowel sounds. Extremities: no cyanosis, clubbing, rash, edema Neuro: alert & oriented x 3, cranial nerves grossly intact. moves all 4 extremities w/o  difficulty. Affect pleasant    Telemetry: Sinus 50-60  Personally reviewed        Labs: Basic Metabolic Panel:  Recent Labs Lab 09/15/16 1654 09/16/16 0456 09/16/16 1558 09/17/16 0500 09/18/16 0937 09/19/16 0414 09/20/16 0339 09/21/16 0221 09/22/16 0342  NA 125* 122* 121* 123* 119* 120* 122* 122* 124*  K 4.3 4.5 4.6 5.0 5.1 4.6 4.2 4.1 3.8  CL 91* 92* 91* 92* 89* 88* 87* 87* 87*  CO2 21* 20* 21* 21* 19* 20* 22 23 25   GLUCOSE 157* 136* 183* 127* 239* 221* 159* 143* 74  BUN 66* 70* 68* 78* 85* 90* 88* 84* 85*  CREATININE 3.87* 3.84* 3.82* 4.21* 4.31* 4.21* 4.23* 4.13* 3.98*  CALCIUM 9.2 9.2 9.1 9.2 9.3 9.5 9.4 9.5 9.4  PHOS 5.6* 5.4* 5.5* 6.2*  --   --   --   --   --     Liver Function Tests:  Recent Labs Lab 09/15/16 1654 09/16/16 0456 09/16/16 1558 09/17/16 0500  ALBUMIN 2.9* 3.0* 3.0* 2.9*   No results for input(s): LIPASE, AMYLASE in the last 168 hours. No results for input(s): AMMONIA in the last 168 hours.  CBC:  Recent Labs Lab 09/17/16 0500 09/18/16 0306 09/20/16 0339 09/21/16 0221 09/22/16 0342  WBC 4.4 5.2 6.9  5.4 4.2  HGB 8.4* 8.2* 9.0* 8.9* 8.1*  HCT 24.9* 24.2* 26.6* 25.8* 24.0*  MCV 83.3 82.3 81.6 81.6 81.6  PLT 164 173 180 177 144*    Cardiac Enzymes: No results for input(s): CKTOTAL, CKMB, CKMBINDEX, TROPONINI in the last 168 hours.  BNP: BNP (last 3 results)  Recent Labs  09/28/15 1114  BNP 871.3*    ProBNP (last 3 results) No results for input(s): PROBNP in the last 8760 hours.    Other results:  Imaging: No results found.   Medications:     Scheduled Medications: . amiodarone  200 mg Oral Daily  . aspirin EC  81 mg Oral Daily  . [START ON 09/23/2016] Chlorhexidine Gluconate Cloth  6 each Topical Q0600  . insulin aspart  0-9 Units Subcutaneous TID WC  . insulin aspart  2 Units Subcutaneous TID WC  . insulin glargine  10 Units Subcutaneous Daily  . polyethylene glycol  17 g Oral Daily  . pravastatin  20 mg  Oral q1800  . senna  2 tablet Oral BID  . sodium chloride flush  10-40 mL Intracatheter Q12H    Infusions: . sodium chloride Stopped (09/22/16 1000)  . DOBUTamine 10 mcg/kg/min (09/22/16 0800)  . furosemide Stopped (09/22/16 1004)  . heparin 1,300 Units/hr (09/22/16 0800)  . norepinephrine (LEVOPHED) Adult infusion Stopped (09/21/16 1846)    PRN Medications: acetaminophen, albuterol, chlorpheniramine-HYDROcodone, docusate sodium, hydrOXYzine, mineral oil-hydrophilic petrolatum, ondansetron (ZOFRAN) IV, sodium chloride flush   Patient Profile:   Jessica [name] is a 56 year old with h/o chronic systolic heart failure, ICM, Biotronik ICD, and PAH admitted new onset AFL and cardiogenic shock (EF 10%). Developed AKI requiring CVVHD.  Assessment/Plan/Discussion:    1. A/C Systolic Biventricular Heart Failure -> cardiogenic shock:  - Ischemic cardiomyopathy.  Echo 08/24/16. LVEF 10% severe RV HK (EF down further in setting of AFL).   -Milrinone stopped and switched to dobutamine.  -Now off norepinephrine and on dobutamine 10. Blood pressure is stable but co-ox is back down in the shock range -No other options left. We'll continue current regimen and follow co-ox. If co-ox continues to drop and/or renal function worsens hospice will be the only option. I once again had an extensive talk with her family about this today. -  CVP 11. Continue IV lasix 160 mg twice a day.  hold metolazone.   - No bb or ACE/ARB/ARNI with low output and AKI .   2. A flutter RVR:  - New onset June 11 with device interrogation. S/P TEE DC-CV 6/18 with conversion to NSR.  - In NSR. Continue amio. HR stable in 50-60s  - Continue heparin drip. (Was on Eliquis at home)  - Will transition back to our questions if/when ready for discharge  3. Acute on chronic renal failure (baseline CKD 3) due to cardio-renal syndrome.  - CVVHD started 6/20. Restarted 6/29. Held 09/09/16 with low volume status.   - Per nephrology she is not  a candidate for iHD or restarting CVVHD. They have signed off.  - Creatinine down slightly to 3.9 today. Continue to follow closely especially with dropping co-ox.  4. PAH - Off  sildenafil with low BP.   .  5. CAD S/P multiple interventions.  -  no signs and symptoms of ischemia. Continue 81 mg aspirin. Continue pravastatin.     6. PAD:  -occluded left SFA with mild/mod abnormal ABIs. No revascularization was needed October 2017. No change.    7. H/O LV Thrombus: no thrombus  on TEE. Continue heparin for A flutter. No change.   8. Hyponatremia:  Sodium 124.  Continue free water restriction. Received tolvaptan 7/10  9. Deconditioning - PT following. Recommendations for HHPT  10. ID/UTI - Completed Rocephin for UTI on 7/7.    11. DM2 - Stable.  Continue SSI.   Time spent over 35 minutes. More than half that discussing issues with patient and her family   Arvilla Meres, MD  09/22/2016 12:45 PM  Advanced Heart Failure Team Pager [phone] (M-F; 7a - 4p)  Please contact [facility] for night-coverage after hours (4p -7a ) and weekends on [email or web address] Agree.    [name]

## 2016-09-23 LAB — BASIC METABOLIC PANEL
Anion gap: 12 (ref 5–15)
BUN: 85 mg/dL — AB (ref 6–20)
CHLORIDE: 85 mmol/L — AB (ref 101–111)
CO2: 27 mmol/L (ref 22–32)
Calcium: 9.3 mg/dL (ref 8.9–10.3)
Creatinine, Ser: 3.95 mg/dL — ABNORMAL HIGH (ref 0.44–1.00)
GFR calc Af Amer: 13 mL/min — ABNORMAL LOW (ref >60)
GFR, EST NON AFRICAN AMERICAN: 12 mL/min — AB (ref >60)
GLUCOSE: 88 mg/dL (ref 65–99)
POTASSIUM: 3.4 mmol/L — AB (ref 3.5–5.1)
Sodium: 124 mmol/L — ABNORMAL LOW (ref 135–145)

## 2016-09-23 LAB — COOXEMETRY PANEL
Carboxyhemoglobin: 1.9 % — ABNORMAL HIGH (ref 0.5–1.5)
Methemoglobin: 1.3 % (ref 0.0–1.5)
O2 Saturation: 51.3 %
Total hemoglobin: 8.2 g/dL — ABNORMAL LOW (ref 12.0–16.0)

## 2016-09-23 LAB — CBC
HEMATOCRIT: 23.9 % — AB (ref 36.0–46.0)
Hemoglobin: 8.1 g/dL — ABNORMAL LOW (ref 12.0–15.0)
MCH: 27.6 pg (ref 26.0–34.0)
MCHC: 33.9 g/dL (ref 30.0–36.0)
MCV: 81.6 fL (ref 78.0–100.0)
PLATELETS: 128 10*3/uL — AB (ref 150–400)
RBC: 2.93 MIL/uL — AB (ref 3.87–5.11)
RDW: 15.9 % — ABNORMAL HIGH (ref 11.5–15.5)
WBC: 3.6 10*3/uL — ABNORMAL LOW (ref 4.0–10.5)

## 2016-09-23 LAB — GLUCOSE, CAPILLARY
GLUCOSE-CAPILLARY: 108 mg/dL — AB (ref 65–99)
Glucose-Capillary: 80 mg/dL (ref 65–99)
Glucose-Capillary: 101 mg/dL — ABNORMAL HIGH (ref 65–99)
Glucose-Capillary: 126 mg/dL — ABNORMAL HIGH (ref 65–99)

## 2016-09-23 LAB — HEPARIN LEVEL (UNFRACTIONATED)
HEPARIN UNFRACTIONATED: 0.71 [IU]/mL — AB (ref 0.30–0.70)
Heparin Unfractionated: 0.84 IU/mL — ABNORMAL HIGH (ref 0.30–0.70)

## 2016-09-23 MED ORDER — METOLAZONE 5 MG PO TABS
5.0000 mg | ORAL_TABLET | Freq: Once | ORAL | Status: AC
  Administered 2016-09-23: 5 mg via ORAL
  Filled 2016-09-23: qty 1

## 2016-09-23 NOTE — Progress Notes (Signed)
Patient ID: Jessica MoccasinSherita [name], female   DOB: Jan 20, 1961, 56 y.o.   MRN: [id number]   Advanced Heart Failure Rounding Note   Subjective:    Admitted with new onset atrial flutter and ADHF. Milrinone started 6/15 for worsening HF and renal failure.  S/P TEE and successful DC-CV.   Moved to ICU on 6/19 due to low output and worsening AKI. CVVHD catheter placed. CVVHD stopped 6/27. Resumed 6/29. Then stopped 6/30  Remains off CVVHD.   Remains om dobutamine on 10 mcg Norepi weaned to off. SBP  95-100.  Co-ox up slightly to 51%. Feels ok. Fatigued but no dyspnea.   Weight relatively stable but CVP up to 20 despite lasix 160 IV bid   Objective:   Weight Range:  Vital Signs:   Temp:  [97.3 F (36.3 C)-97.8 F (36.6 C)] 97.8 F (36.6 C) (07/15 0742) Pulse Rate:  [50-70] 55 (07/15 0830) Resp:  [10-28] 16 (07/15 0830) BP: (74-105)/(46-62) 91/55 (07/15 0830) SpO2:  [90 %-100 %] 97 % (07/15 0830) Weight:  [58.1 kg (128 lb 1.4 oz)] 58.1 kg (128 lb 1.4 oz) (07/15 0500) Last BM Date: 09/22/16  Weight change: Filed Weights   09/21/16 0500 09/22/16 0500 09/23/16 0500  Weight: 59.2 kg (130 lb 8.2 oz) 60 kg (132 lb 4.4 oz) 58.1 kg (128 lb 1.4 oz)    Intake/Output:   Intake/Output Summary (Last 24 hours) at 09/23/16 1123 Last data filed at 09/23/16 0823  Gross per 24 hour  Intake           835.65 ml  Output             1525 ml  Net          -689.35 ml     Physical Exam: CVP 20 General:  Sitting in chair. Fatigued. NAD HEENT: normal x for poor dentition Neck: supple. JVP to jaw. Carotids 2+ bilat; no bruits. No lymphadenopathy or thryomegaly appreciated. Cor: PMI laterally displaced. Brady regular  2/6 TR Lungs: dull at bases  Abdomen: soft, nontender, nondistended. No hepatosplenomegaly. No bruits or masses. Good bowel sounds. Extremities: no cyanosis, clubbing, rash, trace edema Neuro: alert & orientedx3, cranial nerves grossly intact. moves all 4 extremities w/o difficulty. Affect  pleasant    Telemetry: Sinus 50s  Personally reviewed        Labs: Basic Metabolic Panel:  Recent Labs Lab 09/16/16 1558 09/17/16 0500  09/19/16 0414 09/20/16 0339 09/21/16 0221 09/22/16 0342 09/23/16 0522  NA 121* 123*  < > 120* 122* 122* 124* 124*  K 4.6 5.0  < > 4.6 4.2 4.1 3.8 3.4*  CL 91* 92*  < > 88* 87* 87* 87* 85*  CO2 21* 21*  < > 20* 22 23 25 27   GLUCOSE 183* 127*  < > 221* 159* 143* 74 88  BUN 68* 78*  < > 90* 88* 84* 85* 85*  CREATININE 3.82* 4.21*  < > 4.21* 4.23* 4.13* 3.98* 3.95*  CALCIUM 9.1 9.2  < > 9.5 9.4 9.5 9.4 9.3  PHOS 5.5* 6.2*  --   --   --   --   --   --   < > = values in this interval not displayed.  Liver Function Tests:  Recent Labs Lab 09/16/16 1558 09/17/16 0500  ALBUMIN 3.0* 2.9*   No results for input(s): LIPASE, AMYLASE in the last 168 hours. No results for input(s): AMMONIA in the last 168 hours.  CBC:  Recent Labs Lab 09/18/16 0306 09/20/16 [id number]  09/21/16 0221 09/22/16 0342 09/23/16 0522  WBC 5.2 6.9 5.4 4.2 3.6*  HGB 8.2* 9.0* 8.9* 8.1* 8.1*  HCT 24.2* 26.6* 25.8* 24.0* 23.9*  MCV 82.3 81.6 81.6 81.6 81.6  PLT 173 180 177 144* 128*    Cardiac Enzymes: No results for input(s): CKTOTAL, CKMB, CKMBINDEX, TROPONINI in the last 168 hours.  BNP: BNP (last 3 results)  Recent Labs  09/28/15 1114  BNP 871.3*    ProBNP (last 3 results) No results for input(s): PROBNP in the last 8760 hours.    Other results:  Imaging: No results found.   Medications:     Scheduled Medications: . amiodarone  200 mg Oral Daily  . aspirin EC  81 mg Oral Daily  . Chlorhexidine Gluconate Cloth  6 each Topical Q0600  . insulin aspart  0-9 Units Subcutaneous TID WC  . insulin aspart  2 Units Subcutaneous TID WC  . insulin glargine  10 Units Subcutaneous Daily  . metolazone  5 mg Oral Once  . polyethylene glycol  17 g Oral Daily  . pravastatin  20 mg Oral q1800  . senna  2 tablet Oral BID  . sodium chloride flush  10-40  mL Intracatheter Q12H    Infusions: . sodium chloride Stopped (09/22/16 1000)  . DOBUTamine 10 mcg/kg/min (09/23/16 0800)  . furosemide Stopped (09/23/16 1033)  . heparin 1,150 Units/hr (09/23/16 0800)  . norepinephrine (LEVOPHED) Adult infusion Stopped (09/21/16 1846)    PRN Medications: acetaminophen, albuterol, chlorpheniramine-HYDROcodone, docusate sodium, hydrOXYzine, mineral oil-hydrophilic petrolatum, ondansetron (ZOFRAN) IV, sodium chloride flush   Patient Profile:   Jessica [name] is a 56 year old with h/o chronic systolic heart failure, ICM, Biotronik ICD, and PAH admitted new onset AFL and cardiogenic shock (EF 10%). Developed AKI requiring CVVHD.  Assessment/Plan/Discussion:    1. End-stage A/C Systolic Biventricular Heart Failure -> cardiogenic shock:  - Ischemic cardiomyopathy.  Echo 08/24/16. LVEF 10% severe RV HK (EF down further in setting of AFL).   -Milrinone stopped and switched to dobutamine.  -Now off norepinephrine and on dobutamine 10. Blood pressure is stable but co-ox is back down in the shock range -Remains very tenuous. No other options left. We'll continue current regimen and follow co-ox and CVP. Co-ox seems to do best with CVP 12-15 range.  -CVP today is 20. Will continue lasix 160IV bid and add metolazone. Once we get CVP into target range the next step will be to switch back to oral diuretics and see if we can maintain volume status and co-ox on IV dobutamine and po diuretics. Would not restart norepi or other inotropes at this point. - I again discussed with her that if co-ox continues to drop and/or renal function worsens -> hospice will be the only option. - No bb or ACE/ARB/ARNI with low output and AKI .   2. A flutter RVR:  - New onset June 11 with device interrogation. S/P TEE DC-CV 6/18 with conversion to NSR.  - Remains sinus brady. Continue low-dose amio. HR stable in 50-60s  - Will switch heparin back to Eliquis 2.5 bid  3. Acute on chronic  renal failure (baseline CKD 3) due to cardio-renal syndrome.  - CVVHD started 6/20. Restarted 6/29. Held 09/09/16 with low volume status.   - Per nephrology she is not a candidate for iHD or restarting CVVHD. They have signed off.  - Creatinine stable at 3.8-3.9 today. Continue to follow very closely   4. PAH - Off  sildenafil with low BP.   [name]  5. CAD S/P multiple interventions.  -  no s/s of ischemia. Continue 81 mg aspirin. Continue pravastatin.     6. PAD:  -occluded left SFA with mild/mod abnormal ABIs. No revascularization was needed October 2017. No change.    7. H/O LV Thrombus: no thrombus on TEE. Continue heparin for A flutter. No change.   8. Hyponatremia:  Sodium stable at 124.  Continue free water restriction. Received tolvaptan 7/10  9. Deconditioning - PT following. Recommendations for HHPT  10. ID/UTI - Completed Rocephin for UTI on 7/7.    11. DM2 - Stable.  Continue SSI.    Arvilla Meres, MD  09/23/2016 11:23 AM  Advanced Heart Failure Team Pager [phone] (M-F; 7a - 4p)  Please contact [facility] for night-coverage after hours (4p -7a ) and weekends on [email or web address] Agree.    [name]

## 2016-09-23 NOTE — Progress Notes (Signed)
Physical Therapy Treatment Patient Details Name: Jessica [name] MRN: Jessica [id number] DOB: May 29, 1960 Today's Date: 09/23/2016    History of Present Illness 56yof on eliquis for afib, s/p DCCV on 6/18, transitioned to IV heparin 6/20 anticipating CVVHD due to Acute oliguric kidney injury: secondary to decompensated HF.  CVVHD stopped 09/05/16.       PT Comments    Continuing work on functional mobility and activity tolerance;  Initial PT goals set on 6/28 continue to be appropriate; Able to manage walking household distances with RW; would like to know exactly how much assistance is available to Jessica [name] at home;   Will consider performing an Oxygen qualifying walk next session;   Follow Up Recommendations  Home health PT;Supervision/Assistance - 24 hour     Equipment Recommendations  None recommended by PT    Recommendations for Other Services       Precautions / Restrictions Precautions Precautions: Fall    Mobility  Bed Mobility               General bed mobility comments: pt sitting EOB with upon arrival  Transfers Overall transfer level: Needs assistance Equipment used: Rolling walker (2 wheeled) Transfers: Sit to/from Stand Sit to Stand: Min guard         General transfer comment: min guard for safety   Ambulation/Gait Ambulation/Gait assistance: Min guard Ambulation Distance (Feet): 175 Feet Assistive device: Rolling walker (2 wheeled) Gait Pattern/deviations: Step-through pattern;Decreased stride length;Trunk flexed   Gait velocity interpretation: Below normal speed for age/gender General Gait Details: cues to self-monitor for activity tolerance;  pt more fatigued today and with flexed trunk throughout   Stairs            Wheelchair Mobility    Modified Rankin (Stroke Patients Only)       Balance     Sitting balance-Leahy Scale: Good       Standing balance-Leahy Scale: Fair                              Cognition  Arousal/Alertness: Awake/alert Behavior During Therapy: WFL for tasks assessed/performed Overall Cognitive Status: Within Functional Limits for tasks assessed                                        Exercises      General Comments General comments (skin integrity, edema, etc.): BP 94/49 pre-walk and 87/53 post walk, both taken standing      Pertinent Vitals/Pain Pain Assessment: No/denies pain Pain Intervention(s): Monitored during session    Home Living                      Prior Function            PT Goals (current goals can now be found in the care plan section) Acute Rehab PT Goals Patient Stated Goal: to go home PT Goal Formulation: With patient Time For Goal Achievement: 10/07/16 (Goals set 6/28 continue to be appropriate) Potential to Achieve Goals: Good Progress towards PT goals: Progressing toward goals    Frequency    Min 3X/week      PT Plan Current plan remains appropriate    Co-evaluation              AM-PAC PT "6 Clicks" Daily Activity  Outcome Measure  Difficulty turning over in bed (  including adjusting bedclothes, sheets and blankets)?: None Difficulty moving from lying on back to sitting on the side of the bed? : None Difficulty sitting down on and standing up from a chair with arms (e.g., wheelchair, bedside commode, etc,.)?: A Little Help needed moving to and from a bed to chair (including a wheelchair)?: A Little Help needed walking in hospital room?: A Little Help needed climbing 3-5 steps with a railing? : A Little 6 Click Score: 20    End of Session Equipment Utilized During Treatment: Gait belt;Oxygen Activity Tolerance: Patient limited by fatigue Patient left: in chair;with call bell/phone within reach Nurse Communication: Mobility status PT Visit Diagnosis: Unsteadiness on feet (R26.81);Muscle weakness (generalized) (M62.81)     Time: 2440-1027 PT Time Calculation (min) (ACUTE ONLY): 22  min  Charges:  $Gait Training: 8-22 mins                    G Codes:       Jessica [name], Jessica [name]  Acute Rehabilitation Services Pager [phone] Office [phone]    Jessica [name] 09/23/2016, 11:14 AM

## 2016-09-23 NOTE — Progress Notes (Signed)
ANTICOAGULATION CONSULT NOTE - Follow Up Consult  Pharmacy Consult for Heparin Indication: atrial fibrillation  Patient Measurements: Height: 5\' 5"  (165.1 cm) Weight: 132 lb 4.4 oz (60 kg) IBW/kg (Calculated) : 57  Assessment: 56yoF previously on eliquis for afib, s/p DCCV on 6/18, transitioned to IV heparin 6/20 anticipating CVVHD. CVVHD stopped 6/27, temp IJ catheter placed 6/28, heparin resumed, and CVVHD resumed 6/29. CRRT stopped again 7/1.   Heparin level up to 0.84 this am.   Goal of Therapy:  Heparin level 0.3-0.7 units/ml Monitor platelets by anticoagulation protocol: Yes   Plan:  Decrease heparin gtt to 1,150 units/hr Check 8 hr heparin level Monitor daily heparin level, CBC, s/s of bleed  Enzo BiNathan Leandro Berkowitz, PharmD, [facility] Center[facility] Clinical Pharmacist Pager [phone] 09/23/2016 5:54 AM

## 2016-09-23 NOTE — Progress Notes (Signed)
ANTICOAGULATION CONSULT NOTE - Follow Up Consult  Pharmacy Consult for Heparin Indication: atrial fibrillation  Patient Measurements: Height: 5\' 5"  (165.1 cm) Weight: 128 lb 1.4 oz (58.1 kg) IBW/kg (Calculated) : 57  Assessment: 56yoF previously on eliquis for afib, s/p DCCV on 6/18, transitioned to IV heparin 6/20 anticipating CVVHD. CVVHD stopped 6/27, temp IJ catheter placed 6/28, heparin resumed, and CVVHD resumed 6/29. CRRT stopped again 7/1.   Heparin level slightly above goal: 0.71 - No bleeding per RN  Goal of Therapy:  Heparin level 0.3-0.7 units/ml Monitor platelets by anticoagulation protocol: Yes   Plan:  Decrease heparin gtt to 1050 units/hr Check 8 hr heparin level Monitor daily heparin level, CBC, s/s of bleed  Ruben Imony Andee Chivers, PharmD Clinical Pharmacist 09/23/2016 5:21 PM

## 2016-09-24 LAB — COOXEMETRY PANEL
Carboxyhemoglobin: 1.9 % — ABNORMAL HIGH (ref 0.5–1.5)
Methemoglobin: 1.3 % (ref 0.0–1.5)
O2 SAT: 60.2 %
Total hemoglobin: 8.1 g/dL — ABNORMAL LOW (ref 12.0–16.0)

## 2016-09-24 LAB — BASIC METABOLIC PANEL
ANION GAP: 11 (ref 5–15)
BUN: 84 mg/dL — AB (ref 6–20)
CHLORIDE: 84 mmol/L — AB (ref 101–111)
CO2: 29 mmol/L (ref 22–32)
Calcium: 9.5 mg/dL (ref 8.9–10.3)
Creatinine, Ser: 3.91 mg/dL — ABNORMAL HIGH (ref 0.44–1.00)
GFR calc Af Amer: 14 mL/min — ABNORMAL LOW (ref >60)
GFR calc non Af Amer: 12 mL/min — ABNORMAL LOW (ref >60)
GLUCOSE: 75 mg/dL (ref 65–99)
POTASSIUM: 3.6 mmol/L (ref 3.5–5.1)
Sodium: 124 mmol/L — ABNORMAL LOW (ref 135–145)

## 2016-09-24 LAB — CBC
HEMATOCRIT: 24.1 % — AB (ref 36.0–46.0)
HEMOGLOBIN: 8.1 g/dL — AB (ref 12.0–15.0)
MCH: 27.3 pg (ref 26.0–34.0)
MCHC: 33.6 g/dL (ref 30.0–36.0)
MCV: 81.1 fL (ref 78.0–100.0)
PLATELETS: 139 10*3/uL — AB (ref 150–400)
RBC: 2.97 MIL/uL — AB (ref 3.87–5.11)
RDW: 15.8 % — ABNORMAL HIGH (ref 11.5–15.5)
WBC: 3.3 10*3/uL — ABNORMAL LOW (ref 4.0–10.5)

## 2016-09-24 LAB — GLUCOSE, CAPILLARY
GLUCOSE-CAPILLARY: 101 mg/dL — AB (ref 65–99)
GLUCOSE-CAPILLARY: 92 mg/dL (ref 65–99)
Glucose-Capillary: 96 mg/dL (ref 65–99)
Glucose-Capillary: 129 mg/dL — ABNORMAL HIGH (ref 65–99)

## 2016-09-24 LAB — HEPARIN LEVEL (UNFRACTIONATED): Heparin Unfractionated: 0.56 IU/mL (ref 0.30–0.70)

## 2016-09-24 MED ORDER — TORSEMIDE 20 MG PO TABS
80.0000 mg | ORAL_TABLET | Freq: Two times a day (BID) | ORAL | Status: DC
  Administered 2016-09-24 (×2): 80 mg via ORAL
  Filled 2016-09-24 (×3): qty 4

## 2016-09-24 MED ORDER — APIXABAN 2.5 MG PO TABS
2.5000 mg | ORAL_TABLET | Freq: Two times a day (BID) | ORAL | Status: DC
Start: 2016-09-24 — End: 2016-10-01
  Administered 2016-09-24 – 2016-10-01 (×15): 2.5 mg via ORAL
  Filled 2016-09-24 (×15): qty 1

## 2016-09-24 NOTE — Progress Notes (Signed)
CARDIAC REHAB PHASE I   PRE:  Rate/Rhythm: 57 SB  BP:  Supine: 101/66  Sitting:   Standing:    SaO2: 99% 1L  MODE:  Ambulation: to chair  ft   POST:  Rate/Rhythm: 61 SR  BP:  Supine:   Sitting: 100/56  Standing:    SaO2: 95% 1L 0930-0946 Pt encouraged to walk but she declined. Encouraged her to get OOB to chair for her breakfast. Walked to chair and then set up breakfast. Call light in reach. Encouraged to call RN when ready to go back to bed or needs bathroom.   Luetta Nuttingharlene Coe Angelos, RN BSN  09/24/2016 9:42 AM

## 2016-09-24 NOTE — Progress Notes (Signed)
Patient ID: Jessica MoccasinSherita [name], female   DOB: 11-15-1960, 56 y.o.   MRN: [id number]   Advanced Heart Failure Rounding Note   Subjective:    Admitted with new onset atrial flutter and ADHF. Milrinone started 6/15 for worsening HF and renal failure.  S/P TEE and successful DC-CV.   Moved to ICU on 6/19 due to low output and worsening AKI. CVVHD catheter placed. CVVHD stopped 6/27. Resumed 6/29. Then stopped 6/30  Remains off CVVHD.   Remains om dobutamine on 10 mcg Norepi weaned to off. SBP stable at 95-100.  Co-ox upt o 60%. Feels ok. Fatigued but no dyspnea.   Weight down 3 pounds overnight with addition of metolazone CVP 15    Objective:   Weight Range:  Vital Signs:   Temp:  [97.7 F (36.5 C)-98 F (36.7 C)] 97.7 F (36.5 C) (07/16 0302) Pulse Rate:  [51-113] 54 (07/16 0302) Resp:  [10-19] 15 (07/16 0302) BP: (91-105)/(47-65) 98/55 (07/16 0302) SpO2:  [61 %-100 %] 96 % (07/16 0302) Weight:  [57 kg (125 lb 9.6 oz)] 57 kg (125 lb 9.6 oz) (07/16 0302) Last BM Date: 09/22/16  Weight change: Filed Weights   09/22/16 0500 09/23/16 0500 09/24/16 0302  Weight: 60 kg (132 lb 4.4 oz) 58.1 kg (128 lb 1.4 oz) 57 kg (125 lb 9.6 oz)    Intake/Output:   Intake/Output Summary (Last 24 hours) at 09/24/16 0533 Last data filed at 09/24/16 0500  Gross per 24 hour  Intake            979.6 ml  Output             1525 ml  Net           -545.4 ml     Physical Exam: CVP 15 General:  Lying in bed . NAD HEENT: normal x for poor dentition Neck: supple. JVP to jaw. Carotids 2+ bilat; no bruits. No lymphadenopathy or thryomegaly appreciated. Cor: PMI laterally displaced. Brady regular 2/6 TR Lungs: clear  Abdomen: soft NT/ND. No hepatosplenomegaly. No bruits or masses. Good bowel sounds. Extremities: no cyanosis, clubbing, rash, trace edema  Neuro: alert & oriented x 3, cranial nerves grossly intact. moves all 4 extremities w/o difficulty. Affect pleasant     Telemetry: Sinus 50s  Personally reviewed         Labs: Basic Metabolic Panel:  Recent Labs Lab 09/20/16 0339 09/21/16 0221 09/22/16 0342 09/23/16 0522 09/24/16 0233  NA 122* 122* 124* 124* 124*  K 4.2 4.1 3.8 3.4* 3.6  CL 87* 87* 87* 85* 84*  CO2 22 23 25 27 29   GLUCOSE 159* 143* 74 88 75  BUN 88* 84* 85* 85* 84*  CREATININE 4.23* 4.13* 3.98* 3.95* 3.91*  CALCIUM 9.4 9.5 9.4 9.3 9.5    Liver Function Tests: No results for input(s): AST, ALT, ALKPHOS, BILITOT, PROT, ALBUMIN in the last 168 hours. No results for input(s): LIPASE, AMYLASE in the last 168 hours. No results for input(s): AMMONIA in the last 168 hours.  CBC:  Recent Labs Lab 09/20/16 0339 09/21/16 0221 09/22/16 0342 09/23/16 0522 09/24/16 0233  WBC 6.9 5.4 4.2 3.6* 3.3*  HGB 9.0* 8.9* 8.1* 8.1* 8.1*  HCT 26.6* 25.8* 24.0* 23.9* 24.1*  MCV 81.6 81.6 81.6 81.6 81.1  PLT 180 177 144* 128* 139*    Cardiac Enzymes: No results for input(s): CKTOTAL, CKMB, CKMBINDEX, TROPONINI in the last 168 hours.  BNP: BNP (last 3 results)  Recent Labs  09/28/15 1114  BNP  871.3*    ProBNP (last 3 results) No results for input(s): PROBNP in the last 8760 hours.    Other results:  Imaging: No results found.   Medications:     Scheduled Medications: . amiodarone  200 mg Oral Daily  . aspirin EC  81 mg Oral Daily  . Chlorhexidine Gluconate Cloth  6 each Topical Q0600  . insulin aspart  0-9 Units Subcutaneous TID WC  . insulin aspart  2 Units Subcutaneous TID WC  . insulin glargine  10 Units Subcutaneous Daily  . polyethylene glycol  17 g Oral Daily  . pravastatin  20 mg Oral q1800  . senna  2 tablet Oral BID  . sodium chloride flush  10-40 mL Intracatheter Q12H    Infusions: . sodium chloride Stopped (09/22/16 1000)  . DOBUTamine 10 mcg/kg/min (09/23/16 0800)  . furosemide Stopped (09/23/16 1852)  . heparin 1,050 Units/hr (09/23/16 2127)  . norepinephrine (LEVOPHED) Adult infusion Stopped (09/21/16 1846)     PRN Medications: acetaminophen, albuterol, chlorpheniramine-HYDROcodone, docusate sodium, hydrOXYzine, mineral oil-hydrophilic petrolatum, ondansetron (ZOFRAN) IV, sodium chloride flush   Patient Profile:   Jessica [name] is a 56 year old with h/o chronic systolic heart failure, ICM, Biotronik ICD, and PAH admitted new onset AFL and cardiogenic shock (EF 10%). Developed AKI requiring CVVHD.  Assessment/Plan/Discussion:    1. End-stage A/C Systolic Biventricular Heart Failure -> cardiogenic shock:  - Ischemic cardiomyopathy.  Echo 08/24/16. LVEF 10% severe RV HK (EF down further in setting of AFL).   -Milrinone stopped and switched to dobutamine.  -Now off norepinephrine and on dobutamine 10. Blood pressure is stable -Remains very tenuous but co-ox has improved over last two days - seems to do best with CVP 12-15 range.  -Now that CVP is in target range will switch back to oral diuretics (torsemide 80 po bid + metooazone as needed)and see if we can maintain volume status and co-ox on IV dobutamine and po diuretics. Would not restart norepi or other inotropes at this point. - I again discussed with her that if co-ox continues to drop and/or renal function worsens -> hospice will be the only option. - No bb or ACE/ARB/ARNI with low output and AKI .   2. A flutter RVR:  - New onset June 11 with device interrogation. S/P TEE DC-CV 6/18 with conversion to NSR.  - Remains sinus brady. Continue low-dose amio. HR stable in 50-60s  - Now back on Eliquis 2.5 bid  3. Acute on chronic renal failure (baseline CKD 3) due to cardio-renal syndrome.  - CVVHD started 6/20. Restarted 6/29. Held 09/09/16 with low volume status.   - Per nephrology she is not a candidate for iHD or restarting CVVHD. They have signed off.  - Creatinine remains stable at 3.8-3.9 today. Continue to follow very closely   4. PAH - Off  sildenafil with low BP.   .  5. CAD S/P multiple interventions.  -  no s/s of ischemia.  Continue 81 mg aspirin. Continue pravastatin.     6. PAD:  -occluded left SFA with mild/mod abnormal ABIs. No revascularization was needed October 2017. No change.    7. H/O LV Thrombus: no thrombus on TEE. Continue heparin for A flutter. No change.   8. Hyponatremia:  Sodium stable at 124.  Continue free water restriction. Received tolvaptan 7/10  9. Deconditioning - PT following. Recommendations for HHPT  10. ID/UTI - Completed Rocephin for UTI on 7/7.    11. DM2 - Stable.  Continue SSI.  Arvilla Meres, MD  09/24/2016 5:33 AM  Advanced Heart Failure Team Pager [phone] (M-F; 7a - 4p)  Please contact [facility] for night-coverage after hours (4p -7a ) and weekends on [email or web address] Agree.    [name]

## 2016-09-24 NOTE — Progress Notes (Signed)
ANTICOAGULATION CONSULT NOTE - Follow Up Consult  Pharmacy Consult for Heparin Indication: atrial fibrillation  Patient Measurements: Height: 5\' 5"  (165.1 cm) Weight: 125 lb 9.6 oz (57 kg) IBW/kg (Calculated) : 57  Assessment: 56yoF previously on eliquis for afib, s/p DCCV on 6/18, transitioned to IV heparin 6/20 anticipating CVVHD. CVVHD stopped 6/27, temp IJ catheter placed 6/28, heparin resumed, and CVVHD resumed 6/29. CRRT stopped again 7/1.   Heparin level therapeutic at 0.56 on 1050 units/hr. CBC low-stable and no bleeding per documentation.    Goal of Therapy:  Heparin level 0.3-0.7 units/ml Monitor platelets by anticoagulation protocol: Yes   Plan:  Continue heparin gtt at 1050 units/hr Confirmatory level in 8 hrs Daily heparin level and CBC Monitor for s/s bleeding  York CeriseKatherine Cook, PharmD Clinical Pharmacist 09/24/16 3:56 AM

## 2016-09-24 NOTE — Plan of Care (Signed)
Problem: Skin Integrity: Goal: Risk for impaired skin integrity will decrease Outcome: Not Progressing Skin tear near Left Upper chest noted during day; assessed and dressed with foam

## 2016-09-25 LAB — COOXEMETRY PANEL
Carboxyhemoglobin: 2.1 % — ABNORMAL HIGH (ref 0.5–1.5)
Carboxyhemoglobin: 2.1 % — ABNORMAL HIGH (ref 0.5–1.5)
METHEMOGLOBIN: 1 % (ref 0.0–1.5)
METHEMOGLOBIN: 0.9 % (ref 0.0–1.5)
O2 SAT: 44.6 %
O2 Saturation: 48.6 %
TOTAL HEMOGLOBIN: 8.3 g/dL — AB (ref 12.0–16.0)
Total hemoglobin: 8.2 g/dL — ABNORMAL LOW (ref 12.0–16.0)

## 2016-09-25 LAB — BASIC METABOLIC PANEL
ANION GAP: 15 (ref 5–15)
BUN: 86 mg/dL — ABNORMAL HIGH (ref 6–20)
CALCIUM: 9.6 mg/dL (ref 8.9–10.3)
CO2: 27 mmol/L (ref 22–32)
CREATININE: 4.2 mg/dL — AB (ref 0.44–1.00)
Chloride: 82 mmol/L — ABNORMAL LOW (ref 101–111)
GFR, EST AFRICAN AMERICAN: 12 mL/min — AB (ref >60)
GFR, EST NON AFRICAN AMERICAN: 11 mL/min — AB (ref >60)
GLUCOSE: 159 mg/dL — AB (ref 65–99)
Potassium: 3.4 mmol/L — ABNORMAL LOW (ref 3.5–5.1)
Sodium: 124 mmol/L — ABNORMAL LOW (ref 135–145)

## 2016-09-25 LAB — CBC
HCT: 24.7 % — ABNORMAL LOW (ref 36.0–46.0)
Hemoglobin: 8.3 g/dL — ABNORMAL LOW (ref 12.0–15.0)
MCH: 27.4 pg (ref 26.0–34.0)
MCHC: 33.6 g/dL (ref 30.0–36.0)
MCV: 81.5 fL (ref 78.0–100.0)
PLATELETS: 113 10*3/uL — AB (ref 150–400)
RBC: 3.03 MIL/uL — ABNORMAL LOW (ref 3.87–5.11)
RDW: 15.7 % — AB (ref 11.5–15.5)
WBC: 3.2 10*3/uL — AB (ref 4.0–10.5)

## 2016-09-25 LAB — GLUCOSE, CAPILLARY
GLUCOSE-CAPILLARY: 87 mg/dL (ref 65–99)
GLUCOSE-CAPILLARY: 98 mg/dL (ref 65–99)
Glucose-Capillary: 118 mg/dL — ABNORMAL HIGH (ref 65–99)
Glucose-Capillary: 81 mg/dL (ref 65–99)

## 2016-09-25 MED ORDER — MORPHINE SULFATE (CONCENTRATE) 10 MG/0.5ML PO SOLN
5.0000 mg | ORAL | Status: DC | PRN
  Administered 2016-09-26 – 2016-09-28 (×5): 5 mg via ORAL
  Filled 2016-09-25 (×5): qty 0.5

## 2016-09-25 NOTE — Progress Notes (Addendum)
Daily Progress Note   Patient Name: Jessica [name]       Date: 09/25/2016 DOB: 26-May-1960  Age: 56 y.o. MRN#: [id number] Attending Physician: Dolores Patty, MD Primary Care Physician: Patient, No Pcp Per Admit Date: 08/22/2016  Reason for Consultation/Follow-up: Establishing goals of care  Subjective:  Sitting up in chair, eating lunch. She tells me that seeing me makes her think about things she just doesn't want to think about, but she is thinking about them, she's just unsure about her decisions.  She hasn't spoken with her children re: coox and creatinine increasing. They are under the impression that she is improving, but she realizes that she isn't.  Gave patient emotional support and she expressed feelings of uncertainty about logistics of dying. Tells me her daughter wants her to be able to come to her home for a little while. Her main worry is that she will die at her daughter's home and [name] will find her. We discussed that there are signs and symptoms when death is imminent and that to there could be a goal placed to try and transfer to residential hospice home before she died. Also, raised concern that if her goal of care is to try and get home, it would be good to start working on that plan now, as I fear that as she declines more, she may not have time to go home. Code status was not discussed today as it has been addressed several times today by previous providers. However, Hard Choices book was given to patient to review at her leisure. She is having some SOB with ambulating with PT. We discussed starting some prn low dose opioid tx to help with this symptom. She agrees. She is having small bowel movements. No nausea, vomiting.  Jessica [name] agreed to allow me to return  to visit her, however, she continues to decline follow up family meeting.      Review of Systems  Constitutional: Positive for malaise/fatigue.  Respiratory: Positive for shortness of breath (with ambulation).   Cardiovascular: Positive for leg swelling. Negative for chest pain.  Gastrointestinal: Negative for nausea and vomiting.  Neurological: Positive for dizziness and weakness.  Psychiatric/Behavioral: Negative for depression. The patient is not nervous/anxious and does not have insomnia.     Length of Stay: 34  Current Medications: Scheduled  Meds:  . amiodarone  200 mg Oral Daily  . apixaban  2.5 mg Oral BID  . Chlorhexidine Gluconate Cloth  6 each Topical Q0600  . insulin aspart  0-9 Units Subcutaneous TID WC  . insulin aspart  2 Units Subcutaneous TID WC  . insulin glargine  10 Units Subcutaneous Daily  . polyethylene glycol  17 g Oral Daily  . pravastatin  20 mg Oral q1800  . senna  2 tablet Oral BID  . sodium chloride flush  10-40 mL Intracatheter Q12H    Continuous Infusions: . sodium chloride Stopped (09/22/16 1000)  . DOBUTamine 10 mcg/kg/min (09/25/16 0800)    PRN Meds: acetaminophen, albuterol, chlorpheniramine-HYDROcodone, docusate sodium, hydrOXYzine, mineral oil-hydrophilic petrolatum, morphine CONCENTRATE, ondansetron (ZOFRAN) IV, sodium chloride flush  Physical Exam  Constitutional: She is oriented to person, place, and time. No distress.  Cachetic, appears ill  Cardiovascular: Normal rate.   Pulmonary/Chest: Effort normal.  Musculoskeletal: She exhibits edema.  Neurological: She is alert and oriented to person, place, and time.  Somnolent, arouses easily, oriented to person and place  Skin: Skin is warm and dry.  Psychiatric: Her behavior is normal. Judgment and thought content normal.  Flat affect  Nursing note and vitals reviewed.           Vital Signs: BP (!) 98/54   Pulse (!) 57   Temp 97.6 F (36.4 C) (Oral)   Resp 14   Ht 5\' 5"  (1.651  m)   Wt 56.4 kg (124 lb 4.8 oz)   SpO2 96%   BMI 20.68 kg/m  SpO2: SpO2: 96 % O2 Device: O2 Device: Nasal Cannula O2 Flow Rate: O2 Flow Rate (L/min): 1 L/min  Intake/output summary:   Intake/Output Summary (Last 24 hours) at 09/25/16 1407 Last data filed at 09/25/16 1300  Gross per 24 hour  Intake            571.6 ml  Output             1450 ml  Net           -878.4 ml   LBM: Last BM Date: 09/24/16 Baseline Weight: Weight: 53.7 kg (118 lb 6.4 oz) Most recent weight: Weight: 56.4 kg (124 lb 4.8 oz)       Palliative Assessment/Data: PPS: 20%    Patient Active Problem List   Diagnosis Date Noted  . Palliative care by specialist   . Goals of care, counseling/discussion   . Advance care planning   . Cardiogenic shock (HCC) 08/30/2016  . Typical atrial flutter (HCC)   . Atrial fibrillation with RVR (HCC) 08/22/2016  . CAD (coronary artery disease) 01/15/2015  . Impetigo 01/15/2015  . AKI (acute kidney injury) (HCC) 01/15/2015  . Hyponatremia 01/15/2015  . Hypomagnesemia 01/15/2015  . Diabetes mellitus type 2, uncontrolled (HCC) 01/15/2015  . Acute on chronic systolic (congestive) heart failure (HCC) 01/13/2015  . Chronic systolic CHF (congestive heart failure) (HCC) 05/05/2014  . Ischemic cardiomyopathy 05/05/2014  . COPD (chronic obstructive pulmonary disease) (HCC) 05/05/2014  . Encounter for therapeutic drug monitoring 04/28/2014  . LV (left ventricular) mural thrombus (HCC) 03/25/2014  . Rheumatoid factor positive 10/26/2013  . OSA (obstructive sleep apnea) 09/03/2013  . Pulmonary HTN (HCC) 09/03/2013  . PAD (peripheral artery disease) (HCC) 10/06/2012  . Chest pain 07/03/2012  . VT (ventricular tachycardia) (HCC) 12/20/2011  . Depression with anxiety 04/30/2011  . Rash 08/24/2010  . Deep vein thrombosis of lower leg (HCC) 06/28/2010  . HYPOPOTASSEMIA 12/24/2008  .  HYPERCHOLESTEROLEMIA 07/15/2008  . NICOTINE ADDICTION 07/15/2008  . DYSPNEA 03/21/2008  .  NONSPECIFIC LOW BP 03/21/2008  . ICD (implantable cardioverter-defibrillator) in place 03/21/2008    Palliative Care Assessment & Plan   Patient Profile: 56 y.o. female  with past medical history of chronic systolic heart failure, CAD, ICD, DM, hyperlipidemia, PAD, DVT, LV thrombus, implanted cardiac monitor admitted on 08/22/2016 with new onset of atrial fibrillation with RVR detected by her ICM and increasing dyspnea. During this admission she was started on milrinone for worsening HF and renal failure. She required CVVHD for AKI/CKI d/t cardiorenal syndrome, this was held on 6/30. There was some temporary improvement in kidney function, but BUN/Cr is again starting to climb. She is not candidate for IHD d/t severe CMP (EF 10%). She has been switched from milrinone to dobutamine, and is still requiring norepi to maintain BP. Overall very poor prognosis. Per Dr. Prescott GumBensimhon's note patient is "actively dying of multi-organ failure". Palliative medicine consulted for GOC.   Assessment/Recommendations/Plan   Morphine 5mg  SL q1hr prn SOB  Continue bowel regimen  PMT will continue to follow and support holistically  Hard Choices book given to patient   Goals of Care and Additional Recommendations:  Limitations on Scope of Treatment: Full Scope Treatment  Code Status:  Full code  Prognosis:   Unable to determine  Discharge Planning:  To Be Determined  Care plan was discussed with patient.  Thank you for allowing the Palliative Medicine Team to assist in the care of this patient.   Total time: 25 mins  Greater than 50%  of this time was spent counseling and coordinating care related to the above assessment and plan.  Ocie BobKasie Oluwadara Gorman, AGNP-C Palliative Medicine   Please contact Palliative Medicine Team phone at [phone] for questions and concerns.

## 2016-09-25 NOTE — Progress Notes (Signed)
PT Cancellation Note  Patient Details Name: Jennye MoccasinSherita Perham MRN: [id number] DOB: 1960-05-25   Cancelled Treatment:    Reason Eval/Treat Not Completed: Patient declined, no reason specified Pt just got settled in recliner and asked that PT return this pm. PT will check on pt later as time allows.    Derek MoundKellyn R Orley Lawry Mickie Badders, PTA Pager: [phone]   09/25/2016, 10:22 AM

## 2016-09-25 NOTE — Progress Notes (Signed)
Patient ID: Jessica [name], female   DOB: 1960-07-14, 56 y.o.   MRN: [id number]   Advanced Heart Failure Rounding Note   Subjective:    Admitted with new onset atrial flutter and ADHF. Milrinone started 6/15 for worsening HF and renal failure.  S/P TEE and successful DC-CV.   Moved to ICU on 6/19 due to low output and worsening AKI. CVVHD catheter placed. CVVHD stopped 6/27. Resumed 6/29. Then stopped 6/30  Remains off CVVHD.     Coox 44.6% this am on dobutamine 10 mcg/kg/min. SBP 80-90s.  CVP ~ 10.  Tired this am. + lightheadedness with standing.  Depressed. States she doesn't want to talk about hospice. Doesn't want [place] to "let her die".  Having mild left sided chest discomfort with deep breathing.   Out 1.3 L and down 1 lb on po torsemide.   Objective:   Weight Range:  Vital Signs:   Temp:  [97.6 F (36.4 C)-98 F (36.7 C)] 97.9 F (36.6 C) (07/17 0258) Pulse Rate:  [53-110] 55 (07/17 0700) Resp:  [11-20] 16 (07/17 0700) BP: (88-120)/(43-66) 91/52 (07/17 0700) SpO2:  [91 %-100 %] 100 % (07/17 0700) Weight:  [124 lb 4.8 oz (56.4 kg)] 124 lb 4.8 oz (56.4 kg) (07/17 0258) Last BM Date: 09/23/16  Weight change: Filed Weights   09/23/16 0500 09/24/16 0302 09/25/16 0258  Weight: 128 lb 1.4 oz (58.1 kg) 125 lb 9.6 oz (57 kg) 124 lb 4.8 oz (56.4 kg)    Intake/Output:   Intake/Output Summary (Last 24 hours) at 09/25/16 0716 Last data filed at 09/25/16 0500  Gross per 24 hour  Intake            684.4 ml  Output             2050 ml  Net          -1365.6 ml     Physical Exam: CVP 10-11 General: Lying in bed. NAD.  HEENT: Normal Neck: Supple. JVP to jaw. Carotids 2+ bilat; no bruits. No thyromegaly or nodule noted. Cor: PMI nondisplaced. Huston Foley, regular. 2/6 TR.  Lungs: CTAB, normal effort. Abdomen: Soft, non-tender, non-distended, no HSM. No bruits or masses. +BS  Extremities: No cyanosis, clubbing, or rash. Trace edema.   Neuro: Alert & orientedx3, cranial nerves  grossly intact. moves all 4 extremities w/o difficulty. Affect pleasant   Telemetry   Personally reviewed, Sinus Brady 50s  EKG   N/A    Labs: Basic Metabolic Panel:  Recent Labs Lab 09/21/16 0221 09/22/16 0342 09/23/16 0522 09/24/16 0233 09/25/16 0415  NA 122* 124* 124* 124* 124*  K 4.1 3.8 3.4* 3.6 3.4*  CL 87* 87* 85* 84* 82*  CO2 23 25 27 29 27   GLUCOSE 143* 74 88 75 159*  BUN 84* 85* 85* 84* 86*  CREATININE 4.13* 3.98* 3.95* 3.91* 4.20*  CALCIUM 9.5 9.4 9.3 9.5 9.6    Liver Function Tests: No results for input(s): AST, ALT, ALKPHOS, BILITOT, PROT, ALBUMIN in the last 168 hours. No results for input(s): LIPASE, AMYLASE in the last 168 hours. No results for input(s): AMMONIA in the last 168 hours.  CBC:  Recent Labs Lab 09/21/16 0221 09/22/16 0342 09/23/16 0522 09/24/16 0233 09/25/16 0415  WBC 5.4 4.2 3.6* 3.3* 3.2*  HGB 8.9* 8.1* 8.1* 8.1* 8.3*  HCT 25.8* 24.0* 23.9* 24.1* 24.7*  MCV 81.6 81.6 81.6 81.1 81.5  PLT 177 144* 128* 139* 113*   Cardiac Enzymes: No results for input(s): CKTOTAL, CKMB, CKMBINDEX, TROPONINI  in the last 168 hours.  BNP: BNP (last 3 results)  Recent Labs  09/28/15 1114  BNP 871.3*   ProBNP (last 3 results) No results for input(s): PROBNP in the last 8760 hours.  Other results:  Imaging: No results found.   Medications:     Scheduled Medications: . amiodarone  200 mg Oral Daily  . apixaban  2.5 mg Oral BID  . aspirin EC  81 mg Oral Daily  . Chlorhexidine Gluconate Cloth  6 each Topical Q0600  . insulin aspart  0-9 Units Subcutaneous TID WC  . insulin aspart  2 Units Subcutaneous TID WC  . insulin glargine  10 Units Subcutaneous Daily  . polyethylene glycol  17 g Oral Daily  . pravastatin  20 mg Oral q1800  . senna  2 tablet Oral BID  . sodium chloride flush  10-40 mL Intracatheter Q12H  . torsemide  80 mg Oral BID    Infusions: . sodium chloride Stopped (09/22/16 1000)  . DOBUTamine 10 mcg/kg/min  (09/25/16 0500)    PRN Medications: acetaminophen, albuterol, chlorpheniramine-HYDROcodone, docusate sodium, hydrOXYzine, mineral oil-hydrophilic petrolatum, ondansetron (ZOFRAN) IV, sodium chloride flush   Patient Profile:   Jessica [name] is a 56 year old with h/o chronic systolic heart failure, ICM, Biotronik ICD, and PAH admitted new onset AFL and cardiogenic shock (EF 10%). Developed AKI requiring CVVHD.  Assessment/Plan/Discussion:   1. End-stage A/C Systolic Biventricular Heart Failure -> cardiogenic shock:  - Ischemic cardiomyopathy.  Echo 08/24/16. LVEF 10% severe RV HK (EF down further in setting of AFL).   -Milrinone stopped and switched to dobutamine.  -Now off norepinephrine and on dobutamine 10. Blood pressure is stable -Pt remains tenuous. Creatinine trending up and coox down despite max dose dobutamine at 10 mcg/kg/min.  - With CVP down to 10-11 and lightheadedness will hold morning torsemide and check back by.  May need to hold both doses today.  - No escalation of treatment with no good outcome. Would not resume norepi or other inotrope at this point.  - Had long discussion about fact that coox and creatinine again and no good options.  - No bb or ACE/ARB/ARNI with low output and AKI .  - Patient refusing to see palliative care this am.  Will continue to discuss goals of care and prognosis as tolerable by patient.  Continue to encourage palliative care discussion.   2. A flutter RVR:  - New onset June 11 with device interrogation. S/P TEE DC-CV 6/18 with conversion to NSR.  - Remains sinus brady. Continue low-dose amio. HR stable in 50-60s  - Continue Eliquis 2.5 mg BID.   3. Acute on chronic renal failure (baseline CKD 3) due to cardio-renal syndrome.  - CVVHD started 6/20. Restarted 6/29. Held 09/09/16 with low volume status.   - Per nephrology she is not a candidate for iHD or restarting CVVHD. They have signed off.   - Creatinine trending up again to 4.20. Continue to  follow closely.   - CVP lower and dizzy. Holding torsemide.   4. PAH - Off sildenafil with low BP . [name].  5. CAD S/P multiple interventions.  -  no s/s of ischemia. Continue 81 mg aspirin. Continue pravastatin.  No change.   6. PAD:  -occluded left SFA with mild/mod abnormal ABIs. No revascularization was needed October 2017. No change.   7. H/O LV Thrombus: no thrombus on TEE.  - Now on Eliquis for aflutter.   8. Hyponatremia:  - Na stable at 124.  -  Continue free water restriction. Received tolvaptan 7/10  9. Deconditioning - PT following. Recommends HH/PT.   10. ID/UTI - Completed Rocephin for UTI 7/7/.   11. DM2 - Stable. Continue SSI.    Pt very tenuous with extremely poor prognosis. She remains full code and is resistant to any talks of palliative care/hospice.  Pt is not a candidate for advanced therapies with her advanced kidney disease.   Addendum: Repeat coox 48%. Prognosis remains quite poor. Pt currently having difficulty processing and discussing end of life decisions. Will continue to follow closely and carefully encourage her.    Graciella Freer, PA-C  09/25/2016 7:16 AM  Advanced Heart Failure Team Pager [phone] (M-F; 7a - 4p)  Please contact [facility] for night-coverage after hours (4p -7a ) and weekends on [email or web address]   Patient seen with PA, agree with the above note.    She is doing poorly.  Despite dobutamine at 10, co-ox is low at 45%.  Creatinine trending upward.  Dizzy this morning.  CVP about 10.  - Agree with holding torsemide this morning, likely very volume sensitive with RV failure. Will need to get pm dose.  - Not candidate for iHD or restarting CVVH.  - Continue dobutamine.   Very poor prognosis.  She and I had a discussion about this this morning.  She is trying to come to terms with things.  She wants to remain full code.  She does not want to talk to palliative care as they "depress" her.  She will need ongoing discussions.  For  now, we will continue current therapies.   [name] 09/25/2016 9:43 AM     .

## 2016-09-25 NOTE — Progress Notes (Signed)
Chaplain talked with patient originally about Advanced Directive.  Patient then shared that she received bad news that she may have three months or less to live.  Chaplain and patient talked about what this would mean for her.  Patient expressed that she doesn't want to be at home and one of her grandchildren find her dead.  Chaplain assisted patient in making meaning of having meaningful conversations with her family.  Chaplain and patient also carefully talked about death and that people can choose to die comfortably.  Patient is asking questions around comfort care and thinking about her family stating "my daughter is not accepting that they say I am dying."  Chaplain will check back and this patient and please call patient as needed.  This Chaplain informed the patient that if she wanted to talk more, she could let her nurse know and I would be happy to come.  Chaplain would like to thank the medical team for taking care of this patient. Ephesians 6:8

## 2016-09-25 NOTE — Progress Notes (Signed)
Patient called RN into room and expressed desire to learn more about living wills, advanced directives, HCPA and code status. Patient requested consults to chaplain and CSW. Patient inquired about code status and was informed that code status did not have to be a yes or no issue and that code status could be customized to fit her wishes.  When asked if she would like to change her code status she stated "no, I want everything to be done."  Consults to CSW and chaplain placed.  Ivery QualeSean Lenette Rau RN

## 2016-09-25 NOTE — Plan of Care (Signed)
Problem: Bowel/Gastric: Goal: Will not experience complications related to bowel motility Outcome: Progressing Type 2 stool;

## 2016-09-25 NOTE — Plan of Care (Signed)
Problem: Bowel/Gastric: Goal: Will not experience complications related to bowel motility Outcome: Progressing Small type 1 stool

## 2016-09-26 LAB — GLUCOSE, CAPILLARY
GLUCOSE-CAPILLARY: 145 mg/dL — AB (ref 65–99)
Glucose-Capillary: 148 mg/dL — ABNORMAL HIGH (ref 65–99)
Glucose-Capillary: 75 mg/dL (ref 65–99)
Glucose-Capillary: 91 mg/dL (ref 65–99)

## 2016-09-26 LAB — BASIC METABOLIC PANEL
ANION GAP: 14 (ref 5–15)
BUN: 91 mg/dL — AB (ref 6–20)
CO2: 29 mmol/L (ref 22–32)
Calcium: 9.4 mg/dL (ref 8.9–10.3)
Chloride: 82 mmol/L — ABNORMAL LOW (ref 101–111)
Creatinine, Ser: 4.52 mg/dL — ABNORMAL HIGH (ref 0.44–1.00)
GFR calc Af Amer: 11 mL/min — ABNORMAL LOW (ref >60)
GFR, EST NON AFRICAN AMERICAN: 10 mL/min — AB (ref >60)
GLUCOSE: 127 mg/dL — AB (ref 65–99)
Potassium: 3.5 mmol/L (ref 3.5–5.1)
Sodium: 125 mmol/L — ABNORMAL LOW (ref 135–145)

## 2016-09-26 LAB — CBC
HEMATOCRIT: 25.2 % — AB (ref 36.0–46.0)
Hemoglobin: 8.7 g/dL — ABNORMAL LOW (ref 12.0–15.0)
MCH: 28 pg (ref 26.0–34.0)
MCHC: 34.5 g/dL (ref 30.0–36.0)
MCV: 81 fL (ref 78.0–100.0)
PLATELETS: 118 10*3/uL — AB (ref 150–400)
RBC: 3.11 MIL/uL — ABNORMAL LOW (ref 3.87–5.11)
RDW: 16.1 % — AB (ref 11.5–15.5)
WBC: 2.8 10*3/uL — ABNORMAL LOW (ref 4.0–10.5)

## 2016-09-26 LAB — COOXEMETRY PANEL
Carboxyhemoglobin: 1.9 % — ABNORMAL HIGH (ref 0.5–1.5)
Methemoglobin: 1.4 % (ref 0.0–1.5)
O2 SAT: 43 %
TOTAL HEMOGLOBIN: 8.6 g/dL — AB (ref 12.0–16.0)

## 2016-09-26 MED ORDER — TORSEMIDE 20 MG PO TABS
80.0000 mg | ORAL_TABLET | Freq: Two times a day (BID) | ORAL | Status: DC
  Administered 2016-09-26 – 2016-10-01 (×11): 80 mg via ORAL
  Filled 2016-09-26 (×11): qty 4

## 2016-09-26 MED ORDER — METOCLOPRAMIDE HCL 5 MG/ML IJ SOLN
10.0000 mg | Freq: Three times a day (TID) | INTRAMUSCULAR | Status: DC
  Administered 2016-09-26 – 2016-09-27 (×3): 10 mg via INTRAVENOUS
  Filled 2016-09-26 (×3): qty 2

## 2016-09-26 MED ORDER — LORAZEPAM 0.5 MG PO TABS
0.2500 mg | ORAL_TABLET | Freq: Four times a day (QID) | ORAL | Status: DC | PRN
  Administered 2016-09-26 – 2016-09-30 (×6): 0.25 mg via SUBLINGUAL
  Filled 2016-09-26 (×6): qty 1

## 2016-09-26 NOTE — Plan of Care (Signed)
Problem: Activity: Goal: Risk for activity intolerance will decrease Outcome: Progressing Patient ambulate in hall [id number]

## 2016-09-26 NOTE — Progress Notes (Signed)
Daily Progress Note   Patient Name: Jessica [name]       Date: 09/26/2016 DOB: Aug 22, 1960  Age: 57 y.o. MRN#: [id number] Attending Physician: Jolaine Artist, MD Primary Care Physician: Patient, No Pcp Per Admit Date: 08/22/2016  Reason for Consultation/Follow-up: Establishing goals of care  Subjective:  Patient sitting up in chair. Son, "Man", and daughter, Hal Hope are at bedside. They express feelings that hospice is being pushed on them despite having declined several times. I clarified with them purpose of Palliative medicine followup- Palliative medicine is specialized medical care for people living with serious illness. It focuses on providing relief from the symptoms and stress of a serious illness. The goal is to improve quality of life for both the patient and the family. I clarified that my goal is not to persuade, but to advocate for their [place], and to also ensure they have all necessary information they need to make necessary decisions. I reviewed with patient and family the reasons for Hospice recommendations being made by providers. Patient states she wants to go home with home health. She does not want Hospice d/t she does not want DNR status. She fully understands the implications of of not being DNR status. We discussed that hospice will accept patients who are full code, however, they will continue to discuss this with the patient. Her and her daughter note they really just want home health with full code status. We further discussed, if patient were to be on ventilator, how long would she want to be kept on it, and would she want artificial feeding? She declined to answer these questions, stating she was tired and didn't want to talk about it, closing her eyes.  I encouraged  family that these are subjects that need to be talked about sooner than later, due to the fact that this is the direction that patient's care is heading. She is declining further each day now. Patient and family were receptive to reviewing advance directives booklet. They stated they will discuss it and complete it this evening.  Patient now having nausea and some vomiting after meals. +BM. C/o SOB at rest. She requested ordered morphine last night, but was told my nursing to try tussionex instead. She is also having bilateral tremors which are bothersome. I discussed using some low dose SL lorazepam for tremors,  however, noted this may make her sleepier. She stated she did not mind this and that she was tired and just wanted to be able to rest.     Review of Systems  Constitutional: Positive for malaise/fatigue.  Respiratory: Positive for sputum production and shortness of breath (with ambulation).   Cardiovascular: Positive for leg swelling. Negative for chest pain.  Gastrointestinal: Positive for nausea and vomiting.  Neurological: Positive for tremors and weakness.  Psychiatric/Behavioral: Negative for depression. The patient is not nervous/anxious and does not have insomnia.     Length of Stay: 35  Current Medications: Scheduled Meds:  . amiodarone  200 mg Oral Daily  . apixaban  2.5 mg Oral BID  . Chlorhexidine Gluconate Cloth  6 each Topical Q0600  . insulin aspart  0-9 Units Subcutaneous TID WC  . insulin aspart  2 Units Subcutaneous TID WC  . insulin glargine  10 Units Subcutaneous Daily  . metoCLOPramide (REGLAN) injection  10 mg Intravenous Q8H  . polyethylene glycol  17 g Oral Daily  . pravastatin  20 mg Oral q1800  . senna  2 tablet Oral BID  . sodium chloride flush  10-40 mL Intracatheter Q12H  . torsemide  80 mg Oral BID    Continuous Infusions: . sodium chloride Stopped (09/22/16 1000)  . DOBUTamine 10 mcg/kg/min (09/26/16 0800)    PRN Meds: acetaminophen,  albuterol, chlorpheniramine-HYDROcodone, docusate sodium, hydrOXYzine, LORazepam, mineral oil-hydrophilic petrolatum, morphine CONCENTRATE, ondansetron (ZOFRAN) IV, sodium chloride flush  Physical Exam  Constitutional: She is oriented to person, place, and time. No distress.  Cachetic, appears ill  Cardiovascular: Normal rate.   Pulmonary/Chest: Effort normal.  Musculoskeletal: She exhibits edema.  Neurological: She is alert and oriented to person, place, and time.  Somnolent, arouses easily, oriented to person and place, bilateral hand tremors  Skin: Skin is warm and dry.  Psychiatric: Her behavior is normal. Judgment and thought content normal.  Flat affect  Nursing note and vitals reviewed.           Vital Signs: BP (!) 98/58 (BP Location: Left Arm)   Pulse (!) 58   Temp 97.7 F (36.5 C) (Oral)   Resp 19   Ht '5\' 5"'$  (1.651 m)   Wt 55.4 kg (122 lb 1.6 oz)   SpO2 99%   BMI 20.32 kg/m  SpO2: SpO2: 99 % O2 Device: O2 Device: Nasal Cannula O2 Flow Rate: O2 Flow Rate (L/min): 1 L/min  Intake/output summary:   Intake/Output Summary (Last 24 hours) at 09/26/16 1257 Last data filed at 09/26/16 1100  Gross per 24 hour  Intake              650 ml  Output             1250 ml  Net             -600 ml   LBM: Last BM Date: 09/25/16 Baseline Weight: Weight: 53.7 kg (118 lb 6.4 oz) Most recent weight: Weight: 55.4 kg (122 lb 1.6 oz)       Palliative Assessment/Data: PPS: 40%    Patient Active Problem List   Diagnosis Date Noted  . Palliative care by specialist   . Goals of care, counseling/discussion   . Advance care planning   . Cardiogenic shock ([place]) 08/30/2016  . Typical atrial flutter ([place])   . Atrial fibrillation with RVR ([place]) 08/22/2016  . CAD (coronary artery disease) 01/15/2015  . Impetigo 01/15/2015  . AKI (acute kidney injury) ([place]) 01/15/2015  .  Hyponatremia 01/15/2015  . Hypomagnesemia 01/15/2015  . Diabetes mellitus type 2, uncontrolled ([place]) 01/15/2015    . Acute on chronic systolic (congestive) heart failure ([place]) 01/13/2015  . Chronic systolic CHF (congestive heart failure) ([name]) 05/05/2014  . Ischemic cardiomyopathy 05/05/2014  . COPD (chronic obstructive pulmonary disease) ([place]) 05/05/2014  . Encounter for therapeutic drug monitoring 04/28/2014  . LV (left ventricular) mural thrombus ([place]) 03/25/2014  . Rheumatoid factor positive 10/26/2013  . OSA (obstructive sleep apnea) 09/03/2013  . Pulmonary HTN ([place]) 09/03/2013  . PAD (peripheral artery disease) ([place]) 10/06/2012  . Chest pain 07/03/2012  . VT (ventricular tachycardia) ([place]) 12/20/2011  . Depression with anxiety 04/30/2011  . Rash 08/24/2010  . Deep vein thrombosis of lower leg ([place]) 06/28/2010  . HYPOPOTASSEMIA 12/24/2008  . HYPERCHOLESTEROLEMIA 07/15/2008  . NICOTINE ADDICTION 07/15/2008  . DYSPNEA 03/21/2008  . NONSPECIFIC LOW BP 03/21/2008  . ICD (implantable cardioverter-defibrillator) in place 03/21/2008    Palliative Care Assessment & Plan   Patient Profile: 56 y.o. female  with past medical history of chronic systolic heart failure, CAD, ICD, DM, hyperlipidemia, PAD, DVT, LV thrombus, implanted cardiac monitor admitted on 08/22/2016 with new onset of atrial fibrillation with RVR detected by her ICM and increasing dyspnea. During this admission she was started on milrinone for worsening HF and renal failure. She required CVVHD for AKI/CKI d/t cardiorenal syndrome, this was held on 6/30. There was some temporary improvement in kidney function, but BUN/Cr is again starting to climb. She is not candidate for IHD d/t severe CMP (EF 10%). She has been switched from milrinone to dobutamine, has been weaned from norepi. Overall very poor prognosis. Co-ox and cr are rising again. Palliative medicine consulted for [place].  Assessment/Recommendations/Plan    ? Can patient d/c home with home health on dobutamine- it is likely at some point she will be readmitted, but at least her  (and her daughter's) goal of being at home for some period of time will have been met. I would avoid further discussions of code status and Hospice for now unless brought up by patient and/or family  Morphine '5mg'$  SL q1hr prn SOB  Lorazepam .'25mg'$  SL q6hr tremors  Metoclopramide '10mg'$  q8hr for nausea/vomiting (likely due to dysmotility r/t decreased perfusion)  Continue bowel regimen  PMT will continue to follow and support holistically   Goals of Care and Additional Recommendations:  Limitations on Scope of Treatment: Full Scope Treatment  Code Status:  Full code  Prognosis:   Unable to determine  Discharge Planning:  To Be Determined  Care plan was discussed with patient, son, and daughter.  Thank you for allowing the Palliative Medicine Team to assist in the care of this patient.   Total time: 70 mins Prolonged services billed: Yes  Greater than 50%  of this time was spent counseling and coordinating care related to the above assessment and plan.  Jessica [name], AGNP-C Palliative Medicine   Please contact Palliative Medicine Team phone at [phone] for questions and concerns.

## 2016-09-26 NOTE — Progress Notes (Signed)
Patient ID: Jessica [name], female   DOB: 1960/09/14, 56 y.o.   MRN: [id number]   Advanced Heart Failure Rounding Note   Subjective:    Admitted with new onset atrial flutter and ADHF. Milrinone started 6/15 for worsening HF and renal failure.  S/P TEE and successful DC-CV.   Moved to ICU on 6/19 due to low output and worsening AKI. CVVHD catheter placed. CVVHD stopped 6/27. Resumed 6/29. Then stopped 6/30  Remains off CVVHD.     Coox 43.0% this am on dobutamine 10 mcg/kg/min. Creatinine continues to worsen. 4.2 -> 4.52. CVP 11-12  Feeling down this am. Denies lightheadedness.  She is frustrated about having to talk with palliative care. She realizes there are no other options with her kidneys and heart continuing to worsen. Getting nauseated after every meal.   Out 400 cc and weight shows down 2 lbs.   Objective:   Weight Range:  Vital Signs:   Temp:  [97.5 F (36.4 C)-98.6 F (37 C)] 98.6 F (37 C) (07/18 0405) Pulse Rate:  [49-60] 60 (07/18 0300) Resp:  [12-24] 13 (07/18 0500) BP: (96-107)/(53-64) 102/55 (07/18 0500) SpO2:  [94 %-100 %] 94 % (07/18 0300) Weight:  [122 lb 1.6 oz (55.4 kg)] 122 lb 1.6 oz (55.4 kg) (07/18 0500) Last BM Date: 09/25/16  Weight change: Filed Weights   09/24/16 0302 09/25/16 0258 09/26/16 0500  Weight: 125 lb 9.6 oz (57 kg) 124 lb 4.8 oz (56.4 kg) 122 lb 1.6 oz (55.4 kg)    Intake/Output:   Intake/Output Summary (Last 24 hours) at 09/26/16 0720 Last data filed at 09/26/16 0500  Gross per 24 hour  Intake            760.8 ml  Output             1200 ml  Net           -439.2 ml     Physical Exam: CVP 11-12 General: Fatigued and ill appearing. NAD.  HEENT: Normal Neck: Supple. JVP to jaw. Carotids 2+ bilat; no bruits. No thyromegaly or nodule noted. Cor: PMI nondisplaced. Regular, brady. 2/6 TR.  Lungs: CTAB, normal effort. Abdomen: Soft, non-tender, non-distended, no HSM. No bruits or masses. +BS  Extremities: No cyanosis,  clubbing, or rash. Trace edema.  Neuro: Alert & orientedx3, cranial nerves grossly intact. moves all 4 extremities w/o difficulty. Affect pleasant   Telemetry   Personally reviewed, Sinus brady 60s   EKG   N/A   Labs: Basic Metabolic Panel:  Recent Labs Lab 09/22/16 0342 09/23/16 0522 09/24/16 0233 09/25/16 0415 09/26/16 0417  NA 124* 124* 124* 124* 125*  K 3.8 3.4* 3.6 3.4* 3.5  CL 87* 85* 84* 82* 82*  CO2 25 27 29 27 29   GLUCOSE 74 88 75 159* 127*  BUN 85* 85* 84* 86* 91*  CREATININE 3.98* 3.95* 3.91* 4.20* 4.52*  CALCIUM 9.4 9.3 9.5 9.6 9.4    Liver Function Tests: No results for input(s): AST, ALT, ALKPHOS, BILITOT, PROT, ALBUMIN in the last 168 hours. No results for input(s): LIPASE, AMYLASE in the last 168 hours. No results for input(s): AMMONIA in the last 168 hours.  CBC:  Recent Labs Lab 09/22/16 0342 09/23/16 0522 09/24/16 0233 09/25/16 0415 09/26/16 0417  WBC 4.2 3.6* 3.3* 3.2* 2.8*  HGB 8.1* 8.1* 8.1* 8.3* 8.7*  HCT 24.0* 23.9* 24.1* 24.7* 25.2*  MCV 81.6 81.6 81.1 81.5 81.0  PLT 144* 128* 139* 113* 118*   Cardiac Enzymes: No results  for input(s): CKTOTAL, CKMB, CKMBINDEX, TROPONINI in the last 168 hours.  BNP: BNP (last 3 results)  Recent Labs  09/28/15 1114  BNP 871.3*   ProBNP (last 3 results) No results for input(s): PROBNP in the last 8760 hours.  Other results:  Imaging: No results found.   Medications:     Scheduled Medications: . amiodarone  200 mg Oral Daily  . apixaban  2.5 mg Oral BID  . Chlorhexidine Gluconate Cloth  6 each Topical Q0600  . insulin aspart  0-9 Units Subcutaneous TID WC  . insulin aspart  2 Units Subcutaneous TID WC  . insulin glargine  10 Units Subcutaneous Daily  . polyethylene glycol  17 g Oral Daily  . pravastatin  20 mg Oral q1800  . senna  2 tablet Oral BID  . sodium chloride flush  10-40 mL Intracatheter Q12H    Infusions: . sodium chloride Stopped (09/22/16 1000)  . DOBUTamine 10  mcg/kg/min (09/26/16 0500)    PRN Medications: acetaminophen, albuterol, chlorpheniramine-HYDROcodone, docusate sodium, hydrOXYzine, mineral oil-hydrophilic petrolatum, morphine CONCENTRATE, ondansetron (ZOFRAN) IV, sodium chloride flush   Patient Profile:   Jessica [name] is a 56 year old with h/o chronic systolic heart failure, ICM, Biotronik ICD, and PAH admitted new onset AFL and cardiogenic shock (EF 10%). Developed AKI requiring CVVHD.  Assessment/Plan/Discussion:   1. End-stage A/C Systolic Biventricular Heart Failure -> cardiogenic shock:  - Ischemic cardiomyopathy.  Echo 08/24/16. LVEF 10% severe RV HK (EF down further in setting of AFL).   -Milrinone stopped and switched to dobutamine.  -Now off norepinephrine and on dobutamine 10. BP stable. -Pt continues to worsen. Coox 43% and creatinine up again despite max dose dobutamine at 10 mcg/kg/min.  - CVP 11-12 .  Continue torsemide 80 mg BID (Listed as allergy, she has tolerated this admission) - No escalation of treatment with no good outcome. Would not resume norepi or other inotrope at this point.  - Had long discussion about fact that coox and creatinine again and no good options moving forward.  - No bb or ACE/ARB/ARNI with low output and AKI .  - Patient initiated discussion with palliative care yesterday. Plan to continue today.   2. A flutter RVR:  - New onset June 11 with device interrogation. S/P TEE DC-CV 6/18 with conversion to NSR.  - Remains sinus brady. Continue low-dose amio. HR stable in 50-60s  - Continue Eliquis 2.5 mg BID.   3. Acute on chronic renal failure (baseline CKD 3) due to cardio-renal syndrome.  - CVVHD started 6/20. Restarted 6/29. Held 09/09/16 with low volume status.   - Per nephrology she is NOT a candidate for iHD OR restarting CVVHD. They have signed off. Agree.  - Creatinine continues to worsen. Now up to 4.52. BUN in 90s.   4. PAH - Off sildenafil with low BP. No change.  .  5. CAD S/P multiple  interventions.  -  no s/s of ischemia. Continue 81 mg aspirin. Continue pravastatin.  No change.   6. PAD:  -occluded left SFA with mild/mod abnormal ABIs. No revascularization was needed October 2017. No change.   7. H/O LV Thrombus: no thrombus on TEE.  - Now on Eliquis for aflutter. No change.   8. Hyponatremia:  - Na 125 this am. Continue free water restrict. Received tolvaptan 7/10  9. Deconditioning - PT following. Recommends HH/PT. No change.   10. ID/UTI - Completed Rocephin for UTI 7/7. Afebrile. WBC 2.8.  11. DM2 - Stable. Continue SSI.  No change.   Pt remains extremely tenuous with symptoms of low perfusion. Believe it is unsafe to send her out in current condition without hospice arrangements.  She wishes to have more time to process decision and to speak with family. Will ask palliative care to see again today.      Discussed with patient that if her breathing worsens and she needs a "breathing tube" she would likely never be taken off of this. She seems to understand. Remains full code.   Graciella Freer, PA-C  09/26/2016 7:20 AM  Advanced Heart Failure Team Pager [phone] (M-F; 7a - 4p)  Please contact [facility] for night-coverage after hours (4p -7a ) and weekends on [email or web address]  Patient seen with PA, agree with the above note.    She is still doing poorly.  Despite dobutamine at 10, co-ox is low at 43%.  Creatinine and BUN still trending upward.  She has developed a tremor.  CVP around 12. - Continue torsemide 80 mg bid, she is making urine.  - Not candidate for iHD or restarting CVVH.  - Continue dobutamine.   Very poor prognosis.  I had a long discussion with patient, son and daughter this afternoon.  I told them that she is not a candidate for transplant or dialysis.  I told her that I think with the steady rise in BUN/creatinine that her prognosis is probably days at this point.  Hospice/comfort care would be very reasonable.  She wants to  think about this more.  Will continue current therapy with no escalation.   [name] 09/26/2016 4:27 PM   .

## 2016-09-26 NOTE — Progress Notes (Signed)
Had prolonged discussion with patient and her son.   They understand that Dobutamine is no longer working, and at this point it is unsafe to send her home on her current support.  She is not a candidate for increased support, including CVVHD with poor prognosis and lack of response.      Pt son asks about transplant. I explained in her current state of unwell, apart from being too ill for consideration, she would not survive to transplant pending availability, nor would she be likely to survive the surgery itself. This seemed to put it in a better perspective for son.  He understands current medications are not working, and she has no additional options.   Pt with minimal participation in conversation, but verbalizes that she understands it would be unsafe to send her home with out adequate support, namely Hospice. She is not willing to consider Hospice at this time.   Will continue to discuss with family members and to guide in decision making. They have asked for time to talk amongst themselves.   Casimiro NeedleMichael [address]Andy" Lorenzoillery, PA-C 09/26/2016 3:55 PM

## 2016-09-27 LAB — GLUCOSE, CAPILLARY
GLUCOSE-CAPILLARY: 152 mg/dL — AB (ref 65–99)
GLUCOSE-CAPILLARY: 152 mg/dL — AB (ref 65–99)
Glucose-Capillary: 153 mg/dL — ABNORMAL HIGH (ref 65–99)
Glucose-Capillary: 127 mg/dL — ABNORMAL HIGH (ref 65–99)

## 2016-09-27 LAB — COOXEMETRY PANEL
Carboxyhemoglobin: 1.8 % — ABNORMAL HIGH (ref 0.5–1.5)
METHEMOGLOBIN: 1.2 % (ref 0.0–1.5)
O2 Saturation: 51.3 %
Total hemoglobin: 8.6 g/dL — ABNORMAL LOW (ref 12.0–16.0)

## 2016-09-27 LAB — BASIC METABOLIC PANEL
Anion gap: 13 (ref 5–15)
BUN: 98 mg/dL — ABNORMAL HIGH (ref 6–20)
CHLORIDE: 82 mmol/L — AB (ref 101–111)
CO2: 31 mmol/L (ref 22–32)
Calcium: 9.5 mg/dL (ref 8.9–10.3)
Creatinine, Ser: 4.79 mg/dL — ABNORMAL HIGH (ref 0.44–1.00)
GFR calc non Af Amer: 9 mL/min — ABNORMAL LOW (ref >60)
GFR, EST AFRICAN AMERICAN: 11 mL/min — AB (ref >60)
Glucose, Bld: 141 mg/dL — ABNORMAL HIGH (ref 65–99)
POTASSIUM: 3.6 mmol/L (ref 3.5–5.1)
Sodium: 126 mmol/L — ABNORMAL LOW (ref 135–145)

## 2016-09-27 LAB — CBC
HEMATOCRIT: 25.9 % — AB (ref 36.0–46.0)
HEMOGLOBIN: 8.8 g/dL — AB (ref 12.0–15.0)
MCH: 27.8 pg (ref 26.0–34.0)
MCHC: 34 g/dL (ref 30.0–36.0)
MCV: 81.7 fL (ref 78.0–100.0)
Platelets: 125 10*3/uL — ABNORMAL LOW (ref 150–400)
RBC: 3.17 MIL/uL — AB (ref 3.87–5.11)
RDW: 16 % — ABNORMAL HIGH (ref 11.5–15.5)
WBC: 2.9 10*3/uL — AB (ref 4.0–10.5)

## 2016-09-27 MED ORDER — METOCLOPRAMIDE HCL 10 MG PO TABS
5.0000 mg | ORAL_TABLET | Freq: Three times a day (TID) | ORAL | Status: DC
  Administered 2016-09-27 – 2016-10-01 (×15): 5 mg via ORAL
  Filled 2016-09-27 (×15): qty 1

## 2016-09-27 MED ORDER — METOCLOPRAMIDE HCL 5 MG/ML IJ SOLN
5.0000 mg | Freq: Three times a day (TID) | INTRAMUSCULAR | Status: DC
  Administered 2016-09-27: 5 mg via INTRAVENOUS
  Filled 2016-09-27: qty 2

## 2016-09-27 NOTE — Plan of Care (Signed)
Problem: Nutrition: Goal: Adequate nutrition will be maintained Outcome: Not Progressing Patient reports nausea after eating; poor appetite; eating <10% of each meal

## 2016-09-27 NOTE — Progress Notes (Signed)
PT Cancellation Note  Patient Details Name: Jennye MoccasinSherita Hillegass MRN: [id number] DOB: September 23, 1960   Cancelled Treatment:    Reason Eval/Treat Not Completed: Patient declined, no reason specified Pt politely declined due to fatigue and would like to rest. PT will continue to follow acutely.    Derek MoundKellyn R Braiden Rodman Anaisha Mago, PTA Pager: [phone]   09/27/2016, 3:22 PM

## 2016-09-27 NOTE — Progress Notes (Signed)
Admit: 08/22/2016 LOS: 36  38F end stage sHF   Subjective:  I was asked to come back by discuss current renal status with patient by Dr. Shirlee LatchMcLean. She continues to be inotrope dependent with very low GFR, but some response to high-dose loop diuretics. She previously received CRRT but it was stopped more than a week ago given that the patient had end-stage heart failure and no foreseeable long-term dialysis modality is available.  I sat down with the patient and discuss this. She was receptive. Afterwards she was quiet. I clearly let her know that I do not think that dialysis would be able to restore her to the health that she desired or to benefit her cardiac status.    07/18 0701 - 07/19 0700 In: 710 [P.O.:480; I.V.:230] Out: 950 [Urine:950]  Filed Weights   09/25/16 0258 09/26/16 0500 09/27/16 0500  Weight: 56.4 kg (124 lb 4.8 oz) 55.4 kg (122 lb 1.6 oz) 55.3 kg (122 lb)    Scheduled Meds: . amiodarone  200 mg Oral Daily  . apixaban  2.5 mg Oral BID  . Chlorhexidine Gluconate Cloth  6 each Topical Q0600  . insulin aspart  0-9 Units Subcutaneous TID WC  . insulin aspart  2 Units Subcutaneous TID WC  . insulin glargine  10 Units Subcutaneous Daily  . metoCLOPramide (REGLAN) injection  5 mg Intravenous Q8H  . polyethylene glycol  17 g Oral Daily  . pravastatin  20 mg Oral q1800  . senna  2 tablet Oral BID  . sodium chloride flush  10-40 mL Intracatheter Q12H  . torsemide  80 mg Oral BID   Continuous Infusions: . sodium chloride Stopped (09/22/16 1000)  . DOBUTamine 10 mcg/kg/min (09/27/16 0600)   PRN Meds:.acetaminophen, albuterol, chlorpheniramine-HYDROcodone, docusate sodium, hydrOXYzine, LORazepam, mineral oil-hydrophilic petrolatum, morphine CONCENTRATE, ondansetron (ZOFRAN) IV, sodium chloride flush  Current Labs: reviewed    Physical Exam:  Blood pressure (!) 97/56, pulse (!) 57, temperature 98.1 F (36.7 C), temperature source Oral, resp. rate 10, height 5\' 5"  (1.651  m), weight 55.3 kg (122 lb), SpO2 100 %. NAD, cachectic RRR CTAB on ant auscultaton Trace LEE  Patient has advanced end-stage systolic heart failure on chronic inotrope therapy with very low GFR at the current time. She previously received continuous hemodialysis but it was stopped more than a week ago because of her irreversible severe cardiac comorbidities.   I agree with his previous assessment. I have explained this to her and provided an opportunity for further discussion. [facility].    Sabra Heckyan Tytiana Coles MD 09/27/2016, 10:42 AM   Recent Labs Lab 09/25/16 0415 09/26/16 0417 09/27/16 0500  NA 124* 125* 126*  K 3.4* 3.5 3.6  CL 82* 82* 82*  CO2 27 29 31   GLUCOSE 159* 127* 141*  BUN 86* 91* 98*  CREATININE 4.20* 4.52* 4.79*  CALCIUM 9.6 9.4 9.5    Recent Labs Lab 09/25/16 0415 09/26/16 0417 09/27/16 0500  WBC 3.2* 2.8* 2.9*  HGB 8.3* 8.7* 8.8*  HCT 24.7* 25.2* 25.9*  MCV 81.5 81.0 81.7  PLT 113* 118* 125*

## 2016-09-27 NOTE — Progress Notes (Addendum)
Patient ID: Jessica [name], female   DOB: November 12, 1960, 56 y.o.   MRN: [id number]   Advanced Heart Failure Rounding Note   Subjective:    Admitted with new onset atrial flutter and ADHF. Milrinone started 6/15 for worsening HF and renal failure.  S/P TEE and successful DC-CV.   Moved to ICU on 6/19 due to low output and worsening AKI. CVVHD catheter placed. CVVHD stopped 6/27. Resumed 6/29. Then stopped 6/30  Remains off CVVHD.     Coox 51.3% this am on dobutamine 10 mcg/kg/min. Creatinine continues to climb 4.52 -> 4.79. CVP 12-13  Down this morning. Tired. Denies SOB or CP. Hasn't needed any morphine.  She is considering Hospice. She can't decide if she wants to go home with hospice, to [facility], or to [facility].   Had 950 cc of UO yesterday. Weight unchanged.   Objective:   Weight Range:  Vital Signs:   Temp:  [97.6 F (36.4 C)-98.9 F (37.2 C)] 97.6 F (36.4 C) (07/19 0358) Pulse Rate:  [50-65] 61 (07/19 0600) Resp:  [10-24] 21 (07/19 0600) BP: (87-123)/(48-74) 113/64 (07/19 0600) SpO2:  [89 %-100 %] 100 % (07/19 0600) Weight:  [122 lb (55.3 kg)] 122 lb (55.3 kg) (07/19 0500) Last BM Date: 09/25/16  Weight change: Filed Weights   09/25/16 0258 09/26/16 0500 09/27/16 0500  Weight: 124 lb 4.8 oz (56.4 kg) 122 lb 1.6 oz (55.4 kg) 122 lb (55.3 kg)    Intake/Output:   Intake/Output Summary (Last 24 hours) at 09/27/16 0725 Last data filed at 09/27/16 0600  Gross per 24 hour  Intake              710 ml  Output              950 ml  Net             -240 ml     Physical Exam: CVP 12-13 General: Chronically ill and fatigued apearring. NAD.  HEENT: Normal x/for poor dentition Neck: Supple. JVP to jaw. Carotids 2+ bilat; no bruits. No thyromegaly or nodule noted. Cor: PMI nondisplaced. RRR, 2/6 TR Lungs: CTAB, normal effort. Abdomen: Soft, non-tender, non-distended, no HSM. No bruits or masses. +BS  Extremities: No cyanosis, clubbing, or rash. Trace to 1+ edema.  Neuro:  Alert & orientedx3, cranial nerves grossly intact. moves all 4 extremities w/o difficulty. Affect pleasant   Telemetry   Personally reviewed, sinus brady in 50-60s    EKG   N/A   Labs: Basic Metabolic Panel:  Recent Labs Lab 09/23/16 0522 09/24/16 0233 09/25/16 0415 09/26/16 0417 09/27/16 0500  NA 124* 124* 124* 125* 126*  K 3.4* 3.6 3.4* 3.5 3.6  CL 85* 84* 82* 82* 82*  CO2 27 29 27 29 31   GLUCOSE 88 75 159* 127* 141*  BUN 85* 84* 86* 91* 98*  CREATININE 3.95* 3.91* 4.20* 4.52* 4.79*  CALCIUM 9.3 9.5 9.6 9.4 9.5    Liver Function Tests: No results for input(s): AST, ALT, ALKPHOS, BILITOT, PROT, ALBUMIN in the last 168 hours. No results for input(s): LIPASE, AMYLASE in the last 168 hours. No results for input(s): AMMONIA in the last 168 hours.  CBC:  Recent Labs Lab 09/23/16 0522 09/24/16 0233 09/25/16 0415 09/26/16 0417 09/27/16 0500  WBC 3.6* 3.3* 3.2* 2.8* 2.9*  HGB 8.1* 8.1* 8.3* 8.7* 8.8*  HCT 23.9* 24.1* 24.7* 25.2* 25.9*  MCV 81.6 81.1 81.5 81.0 81.7  PLT 128* 139* 113* 118* 125*   Cardiac Enzymes: No  results for input(s): CKTOTAL, CKMB, CKMBINDEX, TROPONINI in the last 168 hours.  BNP: BNP (last 3 results)  Recent Labs  09/28/15 1114  BNP 871.3*   ProBNP (last 3 results) No results for input(s): PROBNP in the last 8760 hours.  Other results:  Imaging: No results found.   Medications:     Scheduled Medications: . amiodarone  200 mg Oral Daily  . apixaban  2.5 mg Oral BID  . Chlorhexidine Gluconate Cloth  6 each Topical Q0600  . insulin aspart  0-9 Units Subcutaneous TID WC  . insulin aspart  2 Units Subcutaneous TID WC  . insulin glargine  10 Units Subcutaneous Daily  . metoCLOPramide (REGLAN) injection  10 mg Intravenous Q8H  . polyethylene glycol  17 g Oral Daily  . pravastatin  20 mg Oral q1800  . senna  2 tablet Oral BID  . sodium chloride flush  10-40 mL Intracatheter Q12H  . torsemide  80 mg Oral BID    Infusions: .  sodium chloride Stopped (09/22/16 1000)  . DOBUTamine 10 mcg/kg/min (09/27/16 0600)    PRN Medications: acetaminophen, albuterol, chlorpheniramine-HYDROcodone, docusate sodium, hydrOXYzine, LORazepam, mineral oil-hydrophilic petrolatum, morphine CONCENTRATE, ondansetron (ZOFRAN) IV, sodium chloride flush   Patient Profile:   Jessica [name] is a 56 year old with h/o chronic systolic heart failure, ICM, Biotronik ICD, and PAH admitted new onset AFL and cardiogenic shock (EF 10%). Developed AKI requiring CVVHD.  Assessment/Plan/Discussion:   1. End-stage A/C Systolic Biventricular Heart Failure -> cardiogenic shock:  - Ischemic cardiomyopathy.  Echo 08/24/16. LVEF 10% severe RV HK (EF down further in setting of AFL).   -Milrinone stopped and switched to dobutamine.  - Remains on dobutamine 10 mcg/kg/min. Will not escalate support with poor prognosis.  - Coox remains marginal at 51%. Creatinine worsening.  - CVP 12-13.  Continue torsemide 80 mg BID (Listed as allergy, she has tolerated this admission) - Have had multiple long discussions that she continues to worsen and hospice/palliative care is only good option.  - No bb or ACE/ARB/ARNI with low output and AKI .  - Continue goals of care discussions.   2. A flutter RVR:  - New onset June 11 with device interrogation. S/P TEE DC-CV 6/18 with conversion to NSR.  - Remains sinus brady. Continue low dose amio. HR stable in 50-60s  - Continue Eliquis 2.5 mg BID.   3. Acute on chronic renal failure (baseline CKD 3) due to cardio-renal syndrome.  - CVVHD started 6/20. Restarted 6/29. Held 09/09/16 with low volume status.   - Per nephrology she is NOT a candidate for iHD OR restarting CVVHD. They have signed off. We agree and have stressed this point with family.  - Creatinine continues to worsen. Now up to 4.79 and BUN up to 98.    4. PAH - Off sildenafil with low BP. No change.  .  5. CAD S/P multiple interventions.  -  no s/s of ischemia.  Continue 81 mg aspirin. Continue pravastatin.  No change.  6. PAD:  -occluded left SFA with mild/mod abnormal ABIs. No revascularization was needed October 2017. No change.   7. H/O LV Thrombus: no thrombus on TEE.  - Now on Eliquis for aflutter. No change.    8. Hyponatremia:  - Na 126 this am. Continue free water restrict. Received tolvaptan 7/10  9. Deconditioning - PT following. Recommends HH/PT. No change.   10. ID/UTI - Completed Rocephin for UTI 7/7. Afebrile. WBC 2.9 this am.   11. DM2 -  Stable. Continue SSI.  No change.    Pt is now willing to consider Hospice care, knowing that she likely has days, at most weeks to live. She would be [facility] for [facility]. Home with hospice and transfer to [facility] are also options. She is most worried she would pass away at home and her grandchild would find her. She will discuss further with son when he arrives.  Graciella Freer, PA-C  09/27/2016 7:25 AM  Advanced Heart Failure Team Pager [phone] (M-F; 7a - 4p)  Please contact [facility] for night-coverage after hours (4p -7a ) and weekends on [email or web address]  Patient seen with PA, agree with the above note.   She is still doing poorly. Despite dobutamine at 10, co-ox is low at 51%. Creatinine and BUN still trending upward. She has developed a tremor.  CVP around 12-13. - Continue torsemide 80 mg bid, she is making urine.  - Not candidate for iHD or restarting CVVH per nephrology, will ask nephrology to see her again today.  - Continue dobutamine for now.   Very poor prognosis. I talked to her at length yesterday about how I did not think that a heart/kidney transplant was going to be an option.  Initially this morning, she indicated readiness for hospice.  However, when I talked with her, she says her son is going to come in to talk about "other options."  Unfortunately, we really do not have any other good options to offer him.  We could send her home on dobutamine with  home health and without hospice, but she will potentially become uremic at home without appropriate symptomatic management, which would likely put her back into the hospital.  Will explore whether she could potentially go home with dobutamine + hospice as I suspect that her time on dobutamine would be short.   [name] 09/27/2016 8:52 AM   .

## 2016-09-27 NOTE — Progress Notes (Addendum)
Daily Progress Note   Patient Name: Jessica [name]       Date: 09/27/2016 DOB: 1961/01/29  Age: 56 y.o. MRN#: [id number] Attending Physician: Dolores Patty, MD Primary Care Physician: Patient, No Pcp Per Admit Date: 08/22/2016  Reason for Consultation/Follow-up: Establishing goals of care  Subjective: Pt reports increased symptom relief of nausea with starting reglan. She is enjoying breakfast. Morphine is helping for SOB and lorazepam is helping with tremors.  PMT made multiple visits to patient today, one in conjuction with [place], [place] with heart failure.  After much back and forth, plan is for patient to proceed home with dobutamine drip by home health care. She agrees to outpatient Palliative follow-up for symptom management and continued GOC. She tells me that this is what she wants to do, but she worries that her kids won't agree. We discussed her needing to assert her feelings and to spend her last days in a way that is helpful to her and her children. She continues to desire full code status. She agrees to palliative visits at home with planned transition to hospice if worsening, but she is pretty sure her children will not agree and they will bring her back to the hospital. I encouraged her to discuss this with her children and to engage the outpatient palliative team in this discussion as well. Mardelle Matte, [place] from heart failure plans to also discuss this with patient's son, [name]. Overall her main goal right now is just to "go home and see what happens". She has been in conversation regarding these plans for over a week and she is tired of the discussion and just wants to be in her home, sitting on her porch, in the sun, getting some fresh air.   Review of Systems  Constitutional: Positive  for malaise/fatigue.    Length of Stay: 36  Current Medications: Scheduled Meds:  . amiodarone  200 mg Oral Daily  . apixaban  2.5 mg Oral BID  . Chlorhexidine Gluconate Cloth  6 each Topical Q0600  . insulin aspart  0-9 Units Subcutaneous TID WC  . insulin aspart  2 Units Subcutaneous TID WC  . insulin glargine  10 Units Subcutaneous Daily  . metoCLOPramide (REGLAN) injection  5 mg Intravenous Q8H  . polyethylene glycol  17 g Oral Daily  . pravastatin  20  mg Oral q1800  . senna  2 tablet Oral BID  . sodium chloride flush  10-40 mL Intracatheter Q12H  . torsemide  80 mg Oral BID    Continuous Infusions: . sodium chloride Stopped (09/22/16 1000)  . DOBUTamine 10 mcg/kg/min (09/27/16 0600)    PRN Meds: acetaminophen, albuterol, chlorpheniramine-HYDROcodone, docusate sodium, hydrOXYzine, LORazepam, mineral oil-hydrophilic petrolatum, morphine CONCENTRATE, ondansetron (ZOFRAN) IV, sodium chloride flush  Physical Exam          Vital Signs: BP 101/68   Pulse 61   Temp 98.2 F (36.8 C) (Oral)   Resp 17   Ht 5\' 5"  (1.651 m)   Wt 55.3 kg (122 lb)   SpO2 100%   BMI 20.30 kg/m  SpO2: SpO2: 100 % O2 Device: O2 Device: Nasal Cannula O2 Flow Rate: O2 Flow Rate (L/min): 1 L/min  Intake/output summary:  Intake/Output Summary (Last 24 hours) at 09/27/16 1432 Last data filed at 09/27/16 1300  Gross per 24 hour  Intake            861.6 ml  Output             1150 ml  Net           -288.4 ml   LBM: Last BM Date: 09/25/16 Baseline Weight: Weight: 53.7 kg (118 lb 6.4 oz) Most recent weight: Weight: 55.3 kg (122 lb)       Palliative Assessment/Data: PPS: 50%    Flowsheet Rows     Most Recent Value  Intake Tab  Referral Department  Cardiology  Unit at Time of Referral  ICU  Palliative Care Primary Diagnosis  Cardiac  Date Notified  09/18/16  Palliative Care Type  New Palliative care  Reason for referral  Clarify Goals of Care  Date of Admission  09/18/16  Date first  seen by Palliative Care  09/18/16  # of days Palliative referral response time  0 Day(s)  # of days IP prior to Palliative referral  0  Clinical Assessment  Psychosocial & Spiritual Assessment  Palliative Care Outcomes      Patient Active Problem List   Diagnosis Date Noted  . Palliative care by specialist   . Goals of care, counseling/discussion   . Advance care planning   . Cardiogenic shock (HCC) 08/30/2016  . Typical atrial flutter (HCC)   . Atrial fibrillation with RVR (HCC) 08/22/2016  . CAD (coronary artery disease) 01/15/2015  . Impetigo 01/15/2015  . AKI (acute kidney injury) (HCC) 01/15/2015  . Hyponatremia 01/15/2015  . Hypomagnesemia 01/15/2015  . Diabetes mellitus type 2, uncontrolled (HCC) 01/15/2015  . Acute on chronic systolic (congestive) heart failure (HCC) 01/13/2015  . Chronic systolic CHF (congestive heart failure) (HCC) 05/05/2014  . Ischemic cardiomyopathy 05/05/2014  . COPD (chronic obstructive pulmonary disease) (HCC) 05/05/2014  . Encounter for therapeutic drug monitoring 04/28/2014  . LV (left ventricular) mural thrombus (HCC) 03/25/2014  . Rheumatoid factor positive 10/26/2013  . OSA (obstructive sleep apnea) 09/03/2013  . Pulmonary HTN (HCC) 09/03/2013  . PAD (peripheral artery disease) (HCC) 10/06/2012  . Chest pain 07/03/2012  . VT (ventricular tachycardia) (HCC) 12/20/2011  . Depression with anxiety 04/30/2011  . Rash 08/24/2010  . Deep vein thrombosis of lower leg (HCC) 06/28/2010  . HYPOPOTASSEMIA 12/24/2008  . HYPERCHOLESTEROLEMIA 07/15/2008  . NICOTINE ADDICTION 07/15/2008  . DYSPNEA 03/21/2008  . NONSPECIFIC LOW BP 03/21/2008  . ICD (implantable cardioverter-defibrillator) in place 03/21/2008    Palliative Care Assessment & Plan   Patient Profile: 56  y.o.femalewith past medical history of chronic systolic heart failure, CAD, ICD, DM, hyperlipidemia, PAD, DVT, LV thrombus, implanted cardiac monitoradmitted on 6/13/2018with new  onset of atrial fibrillation with RVR detected by her ICM and increasing dyspnea. During this admission she was started on milrinone for worsening HF and renal failure. She required CVVHD for AKI/CKI d/t cardiorenal syndrome, this was held on 6/30. There was some temporary improvement in kidney function, but BUN/Cr is again starting to climb. She is not candidate for IHD d/t severe CMP (EF 10%). She has been switched from milrinone to dobutamine, has been weaned from norepi. Overall very poor prognosis. Co-ox and cr are rising again. Palliative medicine consulted for GOC.  Assessment/Recommendations/Plan   D/C home with dobutamine by home health  Outpatient palliative for continued GOC and symptom management  Continue morphine and lorazepam as ordered for symptoms- please write for prescriptions when discharged  Will change metoclopramide to po in anticipation of discharge- please also write for at discharge   Code Status:  Full code  Prognosis:   < 4 weeks due to advanced heart failure continuing to decline on max dobutamine, cr level trending up  Discharge Planning:  Home with Home Health and outpatient palliative  Care plan was discussed with patient, Otilio SaberAndy Tillery, PA, Pam, [facility]  Thank you for allowing the Palliative Medicine Team to assist in the care of this patient.   Total time: 70 minutes Prolong time billed: Yes Time in: 1215 Time Out: 1325  Greater than 50%  of this time was spent counseling and coordinating care related to the above assessment and plan.  Ocie BobKasie Junior Kenedy, AGNP-C Palliative Medicine   Please contact Palliative Medicine Team phone at [phone] for questions and concerns.

## 2016-09-27 NOTE — Progress Notes (Signed)
   Long discussion this evening with family and patient, [facility]  Pt and family have agreed to home with Dobutamine via PICC line, with palliative care in the home, and plans to transition to hospice care with worsening.  They have asked for time to prepare the home for there and a hospital bed.  Order placed. Will plan tentatively for home tomorrow vs over weekend.  Will arrange discharge orders and plans tomorrow am regardless of d/c date, in the event she discharges over weekend.   Casimiro NeedleMichael [address]"Andy" Lakes[place]illery, PA-C 09/27/2016 4:44 PM

## 2016-09-27 NOTE — Progress Notes (Signed)
  Per patient request, [facility] who patched me through to Dr. Jaquita RectorEric Holland in the ICU  Case reviewed at length including cardiac history, circumstances of admission, treatment thus far, and multiple discussions concerning her prognosis and goals of care.  Dr. Marcelle OverlieHolland expressed that with her current course, he agreed with our plan for NO dialysis, and that patient would NOT be a candidate for transplant or other advanced therapies.  He stated they would still be willing to accept the patient in transfer if family desired, so long as they knew her course would be very unlikely to change. He agreed that Hospice consideration was very appropriate.  Discussed with patient and son, Ethelene Brownsnthony via phone, at length. They are disappointed, but wish to meet back at 1 pm when the son can come back to discuss further.   All parties updated. [facility], Dr. Shirlee LatchMcLean, Dr. Marisue HumbleSanford, and Palliative care team)  Baldwin CrownMichael "Andy" Hessmerillery, [place]PA-C 09/27/2016 11:17 AM

## 2016-09-28 LAB — COOXEMETRY PANEL
Carboxyhemoglobin: 1.4 % (ref 0.5–1.5)
Methemoglobin: 1.4 % (ref 0.0–1.5)
O2 Saturation: 38.7 %
Total hemoglobin: 9.1 g/dL — ABNORMAL LOW (ref 12.0–16.0)

## 2016-09-28 LAB — GLUCOSE, CAPILLARY
GLUCOSE-CAPILLARY: 92 mg/dL (ref 65–99)
GLUCOSE-CAPILLARY: 156 mg/dL — AB (ref 65–99)
GLUCOSE-CAPILLARY: 108 mg/dL — AB (ref 65–99)
Glucose-Capillary: 140 mg/dL — ABNORMAL HIGH (ref 65–99)

## 2016-09-28 LAB — CBC
HEMATOCRIT: 26.3 % — AB (ref 36.0–46.0)
Hemoglobin: 8.6 g/dL — ABNORMAL LOW (ref 12.0–15.0)
MCH: 26.8 pg (ref 26.0–34.0)
MCHC: 32.7 g/dL (ref 30.0–36.0)
MCV: 81.9 fL (ref 78.0–100.0)
Platelets: 134 10*3/uL — ABNORMAL LOW (ref 150–400)
RBC: 3.21 MIL/uL — AB (ref 3.87–5.11)
RDW: 15.5 % (ref 11.5–15.5)
WBC: 4.6 10*3/uL (ref 4.0–10.5)

## 2016-09-28 LAB — BASIC METABOLIC PANEL
ANION GAP: 14 (ref 5–15)
BUN: 105 mg/dL — ABNORMAL HIGH (ref 6–20)
CHLORIDE: 79 mmol/L — AB (ref 101–111)
CO2: 31 mmol/L (ref 22–32)
Calcium: 9.8 mg/dL (ref 8.9–10.3)
Creatinine, Ser: 5.2 mg/dL — ABNORMAL HIGH (ref 0.44–1.00)
GFR calc Af Amer: 10 mL/min — ABNORMAL LOW (ref >60)
GFR calc non Af Amer: 8 mL/min — ABNORMAL LOW (ref >60)
GLUCOSE: 146 mg/dL — AB (ref 65–99)
POTASSIUM: 3.7 mmol/L (ref 3.5–5.1)
Sodium: 124 mmol/L — ABNORMAL LOW (ref 135–145)

## 2016-09-28 MED ORDER — BOOST / RESOURCE BREEZE PO LIQD
1.0000 | Freq: Three times a day (TID) | ORAL | Status: DC
  Administered 2016-09-28 – 2016-10-01 (×6): 1 via ORAL

## 2016-09-28 NOTE — Progress Notes (Signed)
Nutrition Brief Note  Pt identified on the Malnutrition Screening Tool Report. Palliative Care Team and Heart Failure Team notes reviewed. Pt is actively dying from multiple system organ failure; poor prognosis. She was interested in trying [employer] supplement; ordered.  Please consult as needed.   Maureen ChattersKatie Shailen Thielen, RD, LDN Pager #: [phone] After-Hours Pager #: [phone]

## 2016-09-28 NOTE — Plan of Care (Signed)
Problem: Safety: Goal: Ability to remain free from injury will improve Outcome: Progressing No falls during this admission. Call bell within reach. Bed in low and locked position. Patient alert and oriented. Clean and clear environment maintained. 3/4 siderails in place. Nonskid footwear being utilized. Patient verbalized understanding of safety instruction.  Problem: Activity: Goal: Activity intolerance will improve Outcome: Progressing [facility] well [facility]. No SOB on exertion with short distances.

## 2016-09-28 NOTE — Progress Notes (Signed)
Patient ID: Jessica [name], female   DOB: 1960-11-15, 56 y.o.   MRN: [id number]   Advanced Heart Failure Rounding Note   Subjective:    Admitted with new onset atrial flutter and ADHF. Milrinone started 6/15 for worsening HF and renal failure.  S/P TEE and successful DC-CV.   Moved to ICU on 6/19 due to low output and worsening AKI. CVVHD catheter placed. CVVHD stopped 6/27. Resumed 6/29. Then stopped 6/30  Remains off CVVHD.  Not candidate for any further.   Coox 38.7% on dobutamine 10 mcg/kg/min. Creatinine continues to worsen. Now up to 5.2 and BUN 105. CVP 14-15  Feels OK this morning. Tired. Denies SOB. She is lightheaded with minimal exertion. Tremors stable, not worse. Denies itching or confusion.  She states that she doesn't think they can have the house ready until Sunday.   Had 925 cc of UO yesterday. Weight unchanged.    Objective:   Weight Range:  Vital Signs:   Temp:  [97.5 F (36.4 C)-98.3 F (36.8 C)] 98 F (36.7 C) (07/20 0325) Pulse Rate:  [57-67] 65 (07/20 0500) Resp:  [9-21] 9 (07/20 0500) BP: (97-128)/(56-76) 128/76 (07/20 0500) SpO2:  [96 %-100 %] 97 % (07/20 0500) Weight:  [122 lb 2.2 oz (55.4 kg)] 122 lb 2.2 oz (55.4 kg) (07/20 0441) Last BM Date: 09/25/16  Weight change: Filed Weights   09/26/16 0500 09/27/16 0500 09/28/16 0441  Weight: 122 lb 1.6 oz (55.4 kg) 122 lb (55.3 kg) 122 lb 2.2 oz (55.4 kg)    Intake/Output:   Intake/Output Summary (Last 24 hours) at 09/28/16 0730 Last data filed at 09/28/16 0600  Gross per 24 hour  Intake            390.8 ml  Output              92 5 ml  Net           -534.2 ml     Physical Exam: CVP 14-15 General: Chronically ill and fatigued appearing. NAD.  HEENT: Normal x/for poor dentition Neck: Supple. JVP to jaw. Carotids 2+ bilat; no bruits. No thyromegaly or nodule noted. Cor: PMI nondisplaced. RRR, 2/6 TR. Lungs: Diminished throughout. Abdomen: Soft, non-tender, mildly distended, no HSM. No bruits  or masses. +BS  Extremities: No cyanosis, clubbing, or rash. Trace to 1+ edema.   Neuro: Alert & orientedx3, cranial nerves grossly intact. moves all 4 extremities w/o difficulty. Affect pleasant   Telemetry   Personally reviewed, sinus brady in 50-60s   EKG   N/A   Labs: Basic Metabolic Panel:  Recent Labs Lab 09/24/16 0233 09/25/16 0415 09/26/16 0417 09/27/16 0500 09/28/16 0335  NA 124* 124* 125* 126* 124*  K 3.6 3.4* 3.5 3.6 3.7  CL 84* 82* 82* 82* 79*  CO2 29 27 29 31 31   GLUCOSE 75 159* 127* 141* 146*  BUN 84* 86* 91* 98* 105*  CREATININE 3.91* 4.20* 4.52* 4.79* 5.20*  CALCIUM 9.5 9.6 9.4 9.5 9.8    Liver Function Tests: No results for input(s): AST, ALT, ALKPHOS, BILITOT, PROT, ALBUMIN in the last 168 hours. No results for input(s): LIPASE, AMYLASE in the last 168 hours. No results for input(s): AMMONIA in the last 168 hours.  CBC:  Recent Labs Lab 09/24/16 0233 09/25/16 0415 09/26/16 0417 09/27/16 0500 09/28/16 0335  WBC 3.3* 3.2* 2.8* 2.9* 4.6  HGB 8.1* 8.3* 8.7* 8.8* 8.6*  HCT 24.1* 24.7* 25.2* 25.9* 26.3*  MCV 81.1 81.5 81.0 81.7 81.9  PLT 139*  113* 118* 125* 134*   Cardiac Enzymes: No results for input(s): CKTOTAL, CKMB, CKMBINDEX, TROPONINI in the last 168 hours.  BNP: BNP (last 3 results) No results for input(s): BNP in the last 8760 hours. ProBNP (last 3 results) No results for input(s): PROBNP in the last 8760 hours.  Other results:  Imaging: No results found.   Medications:     Scheduled Medications: . amiodarone  200 mg Oral Daily  . apixaban  2.5 mg Oral BID  . Chlorhexidine Gluconate Cloth  6 each Topical Q0600  . insulin aspart  0-9 Units Subcutaneous TID WC  . insulin aspart  2 Units Subcutaneous TID WC  . insulin glargine  10 Units Subcutaneous Daily  . metoCLOPramide  5 mg Oral TID AC & HS  . polyethylene glycol  17 g Oral Daily  . pravastatin  20 mg Oral q1800  . senna  2 tablet Oral BID  . sodium chloride flush   10-40 mL Intracatheter Q12H  . torsemide  80 mg Oral BID    Infusions: . sodium chloride Stopped (09/22/16 1000)  . DOBUTamine 10 mcg/kg/min (09/28/16 0348)    PRN Medications: acetaminophen, albuterol, chlorpheniramine-HYDROcodone, docusate sodium, hydrOXYzine, LORazepam, mineral oil-hydrophilic petrolatum, morphine CONCENTRATE, ondansetron (ZOFRAN) IV, sodium chloride flush   Patient Profile:   Jessica [name] is a 56 year old with h/o chronic systolic heart failure, ICM, Biotronik ICD, and PAH admitted new onset AFL and cardiogenic shock (EF 10%). Developed AKI requiring CVVHD.  Assessment/Plan/Discussion:   1. End-stage A/C Systolic Biventricular Heart Failure -> cardiogenic shock:  - Ischemic cardiomyopathy.  Echo 08/24/16. LVEF 10% severe RV HK (EF down further in setting of AFL).   -Milrinone stopped and switched to dobutamine.  - Remains on dobutamine 10 mcg/kg/min. She is failing this. Will not escalate support with abysmal prognosis.  - Mixed venous sat markedly low at 38.7%. No escalation of therapy. Home with palliative care once Hospital bed delivered.  - CVP 14-15.  Continue torsemide 80 mg BID. She is making urine, but not filtering.  Will not escalate treatment at this time. Could consider IV lasix for palliation, but she is not SOB at this time.  - Have had multiple long discussions that she continues to worsen and hospice/palliative care is only good option.  - No bb or ACE/ARB/ARNI with low output and AKI .  - Continue goals of care discussions.   2. A flutter RVR:  - New onset June 11 with device interrogation. S/P TEE DC-CV 6/18 with conversion to NSR.  - remains in sinus brady. Continue low dose amio for palliation. HR stable in 50-60s  - Continue Eliquis 2.5 mg BID. No bleeding.   3. Acute on chronic renal failure (baseline CKD 3) due to cardio-renal syndrome.  - CVVHD started 6/20. Restarted 6/29. Held 09/09/16 with low volume status.   - Per nephrology she is NOT a  candidate for iHD OR restarting CVVHD. They have signed off. We agree and have stressed this point with family.  - Creatinine continues to worsen. Now up to 5.2 and BUN 105. Pt will soon become uremic, and is already exhibiting early signs such as lethargy at time and mild tremors.  - Pt is actively dying from MSOF.   4. PAH - Off sildenafil with low BP. No change.  .  5. CAD S/P multiple interventions.  -  no s/s of ischemia. Continue 81 mg aspirin. Continue pravastatin.  No change.  6. PAD:  -occluded left SFA with mild/mod  abnormal ABIs. No revascularization was needed October 2017. No change.  7. H/O LV Thrombus: no thrombus on TEE.  - Now on Eliquis for aflutter. No change.     8. Hyponatremia:  - Na 124 this am. Continue free water restrict. Received tolvaptan 7/10  9. Deconditioning - PT following. Recommends HH/PT. No change.   10. ID/UTI - Completed Rocephin for UTI 7/7. Afebrile. WBC 4.6 this am.    11. DM2 - Stable. Continue SSI.  No change.    Pt requires hospital bed at home for elevation > 35 degrees for adequate breathing due to her endstage HF. She currently has HOB elevated to 45 degrees with 3 pillows stacked up behind her.   Pt is actively dying from multiple system organ failure. Despite every effort to make this clear to family, pt remains full code and plan is for home with palliative care on dobutamine. Expect patient to rapidly decline at home, and likely return to ED. We have made it very clear that Hospice is her only option moving forward.  This has been confirmed in multidisciplinary conversations including Nephrology, Nursing, Advanced Heart Failure Team, and Consultation with [facility] with Dr. Jaquita RectorEric Holland in the ICU.   Per my conversation with patient, she is still open to hospice, but understand transition off dobutamine and to comfort care would mean a survival of hours to days, which is inline with her expected course even if she  remains on dobutamine.  Will continue to follow and support closely. Appreciate assistance from all involved.   Graciella FreerMichael Andrew Tillery, PA-C  09/28/2016 7:30 AM  Advanced Heart Failure Team Pager [phone] (M-F; 7a - 4p)  Please contact [facility] for night-coverage after hours (4p -7a ) and weekends on [email or web address]  Patient seen with PA, agree with the above note.  She is developing a tremor.  She has some urine output and has been able to maintain a stable weight so far.  Co-ox quite low on high dose dobutamine, suggesting very poor cardiac output.  CVP remains elevated 14-15.  BUN/creatinine continue to worsening, likely developing early uremia.   Dr. Marisue HumbleSanford saw her again yesterday with nephrology, and reiterated that she is not a candidate for IHD or CVVH again.  Dr. Arrie Aranoladonato had told her this last week.   At her son's request[facility].  They do not think that they could offer her anything more than we can offer her here.    She still thinks that there should be some "heart/kidney" machine that she should be able to get on at home to prolong her life.  She remains full code despite extensive discussion.   At this point, I think that our only option is going to be to try to get her home with palliative care services and home dobutamine. She says that her son can't get the house ready for her until Sunday, so we will plan discharge that day.   Her prognosis is quite poor with low cardiac output despite inotrope and progressive renal failure.   Jessica [name] 09/28/2016 9:36 AM

## 2016-09-28 NOTE — Discharge Summary (Signed)
Advanced Heart Failure Discharge Note  Discharge Summary   Patient ID: Jessica [name] MRN: [id number], DOB/AGE: [birth year] 56 y.o. Admit date: 08/22/2016 D/C date:     10/01/2016   Primary Discharge Diagnoses:  1. End-stage A/C Systolic Biventricular Heart Failure -> cardiogenic shock:  2. A flutter RVR:  3. Acute on chronic renal failure (baseline CKD 3) due to cardio-renal syndrome.  4. PAH 5. CAD S/P multiple interventions.  6. PAD:  7. H/O LV Thrombus: no thrombus on TEE.  8. Hyponatremia:  9. Deconditioning 10. ID/UTI 11. DM2  Hospital Course:   Jessica [name] is a 56 y.o. female with h/o chronic systolic heart failure, ICM, Biotronik ICD, and PAH admitted with new onset atrial flutter and Acute systolic CHF.  Per device interrogation, Afib new onset as of 08/20/16.  Started on amiodarone for rate/rhythm control. Milrinone started 08/24/16 for worsening HF and renal failure.  Pt underwent successful with conversion to NSR and remained in NSR for the remainder of her admission.   With attempts to diurese, pt renal function continued to worsen with creatinine peak at 4.5. Pt switched to dobutamine with milrinone.  Pt moved to ICU 08/28/16 and CVVHD catheter placed in consultation with Nephrology. CVVHD started 08/29/16. Responded well and taken off.  Renal function again worsened so restarted 09/07/16. Stopped again 09/08/16 with low volume status. Pt required pressor support with levophed in addition to her dobutamine to support her pressures.   With poor tolerance of CVVHD and end-stage HF, Nephrology confirmed that patient was NOT a candidate for iHD or restarting CVVHD due to poor prognosis and no good end-point.  Levophed titrated off and patient tolerated dobutamine alone for several days, prior to creatinine beginning to worsen again.   Throughout admission, there were multiple and on-going discussion of patients prognosis. Dr. Gala Romney discussed with patient that if she remained  stable on dobutamine alone, we could consider home health with dobutamine with palliative care.  Pt had refused hospice multiple times throughout her admission.   Beginning 7/16, patients creatinine and BUN began to trend back up. Mixed venous saturation continued to drop into 40% range, and ultimate 30% range throughout the week.   Pts prognosis was made abundantly clear to family, with likely days to weeks at most to live.  They continued to refuse hospice consideration, and requested transfer to another facility for transplant consideration and re-initiation of dialysis.   On 09/27/16 Nephrology re-visited prognosis and inappropriateness of resuming dialysis. Dr. Marisue Humble re-iterated she was not candidate for dialysis and would not be moving forward.   [facility] contacted on 09/27/16 and HF team spoke with Dr. Jaquita Rector who was attending in the ICU. Dr. Marcelle Overlie expressed that with her current course, he agreed with our plan for NO dialysis, and that patient would NOT be a candidate for transplant or other advanced therapies.  He stated they would still be willing to accept the patient in transfer if family desired, so long as they knew her course would be very unlikely to change. He agreed that Hospice consideration was very appropriate.  Family declined transfer after being told no further escalation of treatment would be pursued.   Family and pt agreed on 09/27/16 to go home with dobutamine with palliative care transition program, where any decompensation would lead to hospice referral.  They were asked to be given time to get the house ready. DME ordered including hospital bed.    Pt kept over weekend 09/28/16 - 09/30/16 so family  could make arrangements for home. Creatinine and BUN continued to worsen. Family remained adamant that they wished to continue with current plan for home with home health on dobutamine with palliative care assistance. They continued to ask for additional treatments  that could be performed, and again asked to see renal for dialysis consideration.   Family notified staff on am of 10/01/16 that the daughters home was prepared to accept the patient Arrangements finalized via [facility] and palliative care. Hospital bed, Wheel chair, and oxygen all with confirmed delivery or deliver times.   Pt will be discharged today in very tenuous condition. Pt is actively dying of multiple system organ failure. Expected prognosis is days. Family has very poor insight into her disease state despite multiple and on-going conversations. Suspect that patient will rapidly decline at home. Palliative will be available to transition to hospice once family agrees.  They have been informed that if patient returns to the ED, hospice would be the only option.   We have had multiple conversations with family stating our concern for safety of patient without hospice care.  If patient refuses palliative care once in the home, should have immediate family conference with palliative nurse and [facility] representative consideration cessation of dobutamine with likelihood of rapid decline.  Home dobutamine support agreed upon jointly by [facility] and AHF team in setting of direct palliative care involvement only.   Discharge Weight Range: 126 lb Discharge Vitals: Blood pressure 116/73, pulse (!) 56, temperature 97.7 F (36.5 C), temperature source Oral, resp. rate 13, height 5\' 5"  (1.651 m), weight 126 lb (57.2 kg), SpO2 92 %.  Labs: Lab Results  Component Value Date   WBC 5.1 10/01/2016   HGB 8.4 (L) 10/01/2016   HCT 24.8 (L) 10/01/2016   MCV 78.7 10/01/2016   PLT 153 10/01/2016     Recent Labs Lab 10/01/16 0440  NA 119*  K 4.1  CL 76*  CO2 29  BUN 117*  CREATININE 5.53*  CALCIUM 8.9  GLUCOSE 139*   Lab Results  Component Value Date   CHOL 108 02/21/2011   HDL 20 (L) 02/21/2011   LDLCALC 76 02/21/2011   TRIG 62 02/21/2011   BNP (last 3 results) No results for input(s): BNP in the last  8760 hours.  ProBNP (last 3 results) No results for input(s): PROBNP in the last 8760 hours.   Diagnostic Studies/Procedures   No results found.  Discharge Medications   Allergies as of 10/01/2016      Reactions   Nyquil Multi-symptom [pseudoeph-doxylamine-dm-apap] Other (See Comments)   Fatigue, chest pressure and pain Pt tolerates Coricidin cough syrup at home   Sudafed [pseudoephedrine] Other (See Comments)   Fatigue, chest pressure and pain   Lasix [furosemide]    Rash   Torsemide    Rash. Has tolerated admission 7/18   Zocor [simvastatin]    Myalgias   Latex Rash   Ramipril Cough      Medication List    STOP taking these medications   aspirin EC 81 MG tablet   digoxin 0.125 MG tablet Commonly known as:  LANOXIN   ethacrynic acid 25 MG tablet Commonly known as:  EDECRIN   magnesium oxide 400 (241.3 Mg) MG tablet Commonly known as:  MAG-OX   metolazone 2.5 MG tablet Commonly known as:  ZAROXOLYN   potassium chloride SA 20 MEQ tablet Commonly known as:  KLOR-CON M20   spironolactone 25 MG tablet Commonly known as:  ALDACTONE     TAKE these  medications   acetaminophen 500 MG tablet Commonly known as:  TYLENOL Take 1,000 mg by mouth every 6 (six) hours as needed (pain).   albuterol (2.5 MG/3ML) 0.083% nebulizer solution Commonly known as:  PROVENTIL Take 2.5 mg by nebulization every 6 (six) hours as needed for wheezing or shortness of breath.   amiodarone 200 MG tablet Commonly known as:  PACERONE Take 1 tablet (200 mg total) by mouth daily.   apixaban 2.5 MG Tabs tablet Commonly known as:  ELIQUIS Take 1 tablet (2.5 mg total) by mouth 2 (two) times daily.   DOBUTamine 4-5 MG/ML-% infusion Commonly known as:  DOBUTREX Inject 610 mcg/min into the vein continuous.   docusate sodium 100 MG capsule Commonly known as:  COLACE Take 1 capsule (100 mg total) by mouth daily as needed for mild constipation.   feeding supplement Liqd Take 1 Container  by mouth 3 (three) times daily between meals.   hydrOXYzine 25 MG tablet Commonly known as:  ATARAX/VISTARIL Take 1 tablet (25 mg total) by mouth every 4 (four) hours as needed for itching.   insulin glargine 100 UNIT/ML injection Commonly known as:  LANTUS Inject 5 Units into the skin daily as needed (if blood sugar >200).   LORazepam 0.5 MG tablet Commonly known as:  ATIVAN Place 0.5 tablets (0.25 mg total) under the tongue every 6 (six) hours as needed for anxiety (tremors).   metoCLOPramide 5 MG tablet Commonly known as:  REGLAN Take 1 tablet (5 mg total) by mouth 4 (four) times daily -  before meals and at bedtime.   oxyCODONE 5 MG/5ML solution Commonly known as:  ROXICODONE Take 5 mLs (5 mg total) by mouth every hour as needed (shortness of breath, anxiety).   polyethylene glycol packet Commonly known as:  MIRALAX / GLYCOLAX Take 17 g by mouth daily.   pravastatin 20 MG tablet Commonly known as:  PRAVACHOL Take 1 tablet (20 mg total) by mouth daily at 6 PM.   senna 8.6 MG Tabs tablet Commonly known as:  SENOKOT Take 2 tablets (17.2 mg total) by mouth 2 (two) times daily.   torsemide 20 MG tablet Commonly known as:  DEMADEX Take 4 tablets (80 mg total) by mouth 2 (two) times daily.            Durable Medical Equipment        Start     Ordered   10/01/16 [phone]  For home use only DME oxygen  Once    Comments:  Patient going home today with home health/pallitive  Question Answer Comment  Mode or (Route) Nasal cannula   Liters per Minute 2   Frequency Continuous (stationary and portable oxygen unit needed)   Oxygen conserving device Yes   Oxygen delivery system Gas      10/01/16 0845   10/01/16 0844  For home use only DME standard manual wheelchair with seat cushion  Once    Comments:  Patient suffers from chronic heart failure/home hospice which impairs their ability to perform daily activities like bathing, dressing, feeding and grooming in the home.  A  walker will not resolve   issue with performing activities of daily living. A wheelchair will allow patient to safely perform daily activities. Patient can safely propel the wheelchair in the home or has a caregiver who can provide assistance.  Accessories: elevating leg rests (ELRs), wheel locks, extensions and anti-tippers.   10/01/16 0845   09/30/16 0828  Heart failure home health orders  (Heart failure  home health orders / Face to face)  Once    Comments:  Heart Failure Follow-up Care:  Verify follow-up appointments per Patient Discharge Instructions. Confirm transportation arranged. Reconcile home medications with discharge medication list. Remove discontinued medications from use. Assist patient/caregiver to manage medications using pill box. Reinforce low sodium food selection Assessments: Vital signs and oxygen saturation at each visit. Assess home environment for safety concerns, caregiver support and availability of low-sodium foods. Consult Child psychotherapistocial Worker, PT/OT, Dietitian, and CNA based on assessments. Perform comprehensive cardiopulmonary assessment. Notify MD for any change in condition or weight gain of 3 pounds in one day or 5 pounds in one week with symptoms. Daily Weights and Symptom Monitoring: Ensure patient has access to scales. Teach patient/caregiver to weigh daily before breakfast and after voiding using same scale and record.    Teach patient/caregiver to track weight and symptoms and when to notify Provider. Activity: Develop individualized activity plan with patient/caregiver.  [facility] to provide Dobutamine 10 mcg/kg/min with strict criteria of using alongside palliative care with transition to hospice on worsening.  Please provide Home Palliative Care Consult  Question Answer Comment  Heart Failure Follow-up Care [facility] ([facility] at [phone]   Obtain the following labs Basic Metabolic Panel   Lab frequency Other see comments   Fax lab results  to [facility] Clinic at [phone]   Diet Low Sodium Heart Healthy   Fluid restrictions: See other comments 1000 mL     09/30/16 0828   09/28/16 0904  For home use only DME Bedside commode  Once    Question:  Patient needs a bedside commode to treat with the following condition  Answer:  Weakness   09/28/16 0903   09/27/16 1643  For home use only DME Hospital bed  Once    Question:  Bed type  Answer:  Semi-electric   09/27/16 1642      Disposition   The patient will be discharged to home in very tenuous condition with palliative care support. Expected prognosis is days to < 2 weeks.  Discharge Instructions    Call MD for:  redness, tenderness, or signs of infection (pain, swelling, redness, odor or green/yellow discharge around incision site)    Complete by:  As directed    Diet - low sodium heart healthy    Complete by:  As directed    Increase activity slowly    Complete by:  As directed      Follow-up Information    Bensimhon, Bevelyn Bucklesaniel R, MD Follow up on 10/10/2016.   Specialty:  Cardiology Why:  For post hospital follow up at 340 pm. The code for parking is 7001.  Contact information: [address] [place] [address] [phone]            Duration of Discharge Encounter: Greater than 35 minutes   Signed, Luane SchoolMichael Andrew Azie Mcconahy, PA-C 10/01/2016, 11:50 AM

## 2016-09-29 LAB — BASIC METABOLIC PANEL
Anion gap: 11 (ref 5–15)
BUN: 113 mg/dL — AB (ref 6–20)
CALCIUM: 9.6 mg/dL (ref 8.9–10.3)
CHLORIDE: 79 mmol/L — AB (ref 101–111)
CO2: 33 mmol/L — AB (ref 22–32)
CREATININE: 5.39 mg/dL — AB (ref 0.44–1.00)
GFR calc Af Amer: 9 mL/min — ABNORMAL LOW (ref >60)
GFR calc non Af Amer: 8 mL/min — ABNORMAL LOW (ref >60)
GLUCOSE: 173 mg/dL — AB (ref 65–99)
Potassium: 3.7 mmol/L (ref 3.5–5.1)
Sodium: 123 mmol/L — ABNORMAL LOW (ref 135–145)

## 2016-09-29 LAB — COOXEMETRY PANEL
Carboxyhemoglobin: 1.6 % — ABNORMAL HIGH (ref 0.5–1.5)
Methemoglobin: 1.2 % (ref 0.0–1.5)
O2 SAT: 43.5 %
Total hemoglobin: 11.2 g/dL — ABNORMAL LOW (ref 12.0–16.0)

## 2016-09-29 LAB — GLUCOSE, CAPILLARY
GLUCOSE-CAPILLARY: 103 mg/dL — AB (ref 65–99)
Glucose-Capillary: 168 mg/dL — ABNORMAL HIGH (ref 65–99)
Glucose-Capillary: 150 mg/dL — ABNORMAL HIGH (ref 65–99)

## 2016-09-29 LAB — CBC
HCT: 25.4 % — ABNORMAL LOW (ref 36.0–46.0)
HEMOGLOBIN: 8.4 g/dL — AB (ref 12.0–15.0)
MCH: 27 pg (ref 26.0–34.0)
MCHC: 33.1 g/dL (ref 30.0–36.0)
MCV: 81.7 fL (ref 78.0–100.0)
Platelets: 135 10*3/uL — ABNORMAL LOW (ref 150–400)
RBC: 3.11 MIL/uL — AB (ref 3.87–5.11)
RDW: 15.5 % (ref 11.5–15.5)
WBC: 4.8 10*3/uL (ref 4.0–10.5)

## 2016-09-29 NOTE — Progress Notes (Signed)
Patient ID: Jessica [name], female   DOB: 05-17-1960, 56 y.o.   MRN: [id number]   Advanced Heart Failure Rounding Note   Subjective:    Admitted with new onset atrial flutter and ADHF. Milrinone started 6/15 for worsening HF and renal failure.  S/P TEE and successful DC-CV.   Moved to ICU on 6/19 due to low output and worsening AKI. CVVHD catheter placed. CVVHD stopped 6/27. Resumed 6/29. Then stopped 6/30  Remains off CVVHD.  Not candidate for any further CVVH or iHD per Drs Arrie Aran and [place].   Coox 43.5% on dobutamine 10 mcg/kg/min. Creatinine continues to worsen. Now up to 5.4 and BUN 113. CVP remains around 14.  She continues to make urine on current torsemide and weight is stable.  Sodium down to 123.    Tired, not short of breath at rest.  Has not been getting out of bed.  More discussions with patient and family yesterday, initially had planned home with palliative care and dobutamine on Sunday but now apparently palliative care nurse cannot come out until Monday, so will be Monday discharge it looks like.    Objective:   Weight Range:  Vital Signs:   Temp:  [97.5 F (36.4 C)-98.9 F (37.2 C)] 97.6 F (36.4 C) (07/21 0721) Pulse Rate:  [56-73] 57 (07/21 0800) Resp:  [8-26] 9 (07/21 0800) BP: (85-125)/(48-79) 98/70 (07/21 0800) SpO2:  [91 %-99 %] 95 % (07/21 0800) Last BM Date: 09/25/16  Weight change: Filed Weights   09/26/16 0500 09/27/16 0500 09/28/16 0441  Weight: 122 lb 1.6 oz (55.4 kg) 122 lb (55.3 kg) 122 lb 2.2 oz (55.4 kg)    Intake/Output:   Intake/Output Summary (Last 24 hours) at 09/29/16 0828 Last data filed at 09/29/16 0800  Gross per 24 hour  Intake            540.8 ml  Output             10 25 ml  Net           -484.2 ml     Physical Exam: CVP 14 General: Chronically ill and fatigued appearing. NAD.  HEENT: Normal except poor dentition Neck: Supple. JVP 14 cm with prominent EJs. Cor: PMI lateral. RRR, 2/6 TR murmur. Lungs: Diminished  throughout. Abdomen: Soft, non-tender, mildly distended, no HSM. No bruits or masses. +BS  Extremities: No cyanosis, clubbing, or rash. Trace to 1+ edema lower legs.   Neuro: Alert & orientedx3, cranial nerves grossly intact. moves all 4 extremities w/o difficulty. Affect pleasant   Telemetry   Personally reviewed, NSR 60s  EKG   N/A   Labs: Basic Metabolic Panel:  Recent Labs Lab 09/25/16 0415 09/26/16 0417 09/27/16 0500 09/28/16 0335 09/29/16 0442  NA 124* 125* 126* 124* 123*  K 3.4* 3.5 3.6 3.7 3.7  CL 82* 82* 82* 79* 79*  CO2 27 29 31 31  33*  GLUCOSE 159* 127* 141* 146* 173*  BUN 86* 91* 98* 105* 113*  CREATININE 4.20* 4.52* 4.79* 5.20* 5.39*  CALCIUM 9.6 9.4 9.5 9.8 9.6    Liver Function Tests: No results for input(s): AST, ALT, ALKPHOS, BILITOT, PROT, ALBUMIN in the last 168 hours. No results for input(s): LIPASE, AMYLASE in the last 168 hours. No results for input(s): AMMONIA in the last 168 hours.  CBC:  Recent Labs Lab 09/25/16 0415 09/26/16 0417 09/27/16 0500 09/28/16 0335 09/29/16 0442  WBC 3.2* 2.8* 2.9* 4.6 4.8  HGB 8.3* 8.7* 8.8* 8.6* 8.4*  HCT 24.7* 25.2*  25.9* 26.3* 25.4*  MCV 81.5 81.0 81.7 81.9 81.7  PLT 113* 118* 125* 134* 135*   Cardiac Enzymes: No results for input(s): CKTOTAL, CKMB, CKMBINDEX, TROPONINI in the last 168 hours.  BNP: BNP (last 3 results) No results for input(s): BNP in the last 8760 hours. ProBNP (last 3 results) No results for input(s): PROBNP in the last 8760 hours.  Other results:  Imaging: No results found.   Medications:     Scheduled Medications: . amiodarone  200 mg Oral Daily  . apixaban  2.5 mg Oral BID  . Chlorhexidine Gluconate Cloth  6 each Topical Q0600  . feeding supplement  1 Container Oral TID BM  . insulin aspart  0-9 Units Subcutaneous TID WC  . insulin aspart  2 Units Subcutaneous TID WC  . insulin glargine  10 Units Subcutaneous Daily  . metoCLOPramide  5 mg Oral TID AC & HS  .  polyethylene glycol  17 g Oral Daily  . pravastatin  20 mg Oral q1800  . senna  2 tablet Oral BID  . sodium chloride flush  10-40 mL Intracatheter Q12H  . torsemide  80 mg Oral BID    Infusions: . sodium chloride Stopped (09/22/16 1000)  . DOBUTamine 10 mcg/kg/min (09/28/16 0800)    PRN Medications: acetaminophen, albuterol, chlorpheniramine-HYDROcodone, docusate sodium, hydrOXYzine, LORazepam, mineral oil-hydrophilic petrolatum, morphine CONCENTRATE, ondansetron (ZOFRAN) IV, sodium chloride flush   Patient Profile:   Jessica [name] is a 56 year old with h/o chronic systolic heart failure, ICM, Biotronik ICD, and PAH admitted new onset AFL and cardiogenic shock (EF 10%). Developed AKI requiring CVVHD.  Assessment/Plan/Discussion:   1. 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  Ischemic cardiomyopathy.  Echo 08/24/16. LVEF 10% severe RV HK (EF down further in setting of atrial flutter).  Milrinone stopped and switched to dobutamine. Remains on dobutamine 10 mcg/kg/min. She is failing this. Will not escalate support with poor prognosis. Co-ox 43.5% today, CVP around 14 still.  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  She was on CVVH for a long time this admission but stopped by renal due to poor response.   Renal function has not recovered off CVVH and she is not an iHD candidate with severe CHF. She does make some urine and we have been able to keep her weight stable.   - Multiple discussions now about lack of candidacy for LVAD, iHD, transplant.  Still some concern from children that there must be something else to do.  She wants to remain full code though willing to have palliative care follow her at home.  - She will remain on dobutamine at 10 though cardiac output remains poor.  - Continue current torsemide 80 mg bid, she is making urine and weight is staying relatively stable.  2. A flutter RVR: New onset June 11 with device interrogation.  S/P TEE DC-CV 6/18 with conversion to NSR.  - Remains in NSR, continue amiodarone.   - Continue Eliquis 2.5 mg BID.  Stop this if she transitions to hospice.  3. Acute on chronic renal failure (baseline CKD 3) due to cardio-renal syndrome.  CVVHD started 6/20. Restarted 6/29. Held 09/09/16 with low volume status.  Per nephrology she is NOT a candidate for iHD OR restarting CVVHD. They have signed off.  - BUN and creatinine continue to worsen, she has developed a tremor.  I suspect that she is going to have progressive renal failure at this point.  - Still making urine, continue diuretic.  4. PAH:  Off sildenafil with low BP. No change.  5. CAD S/P multiple interventions. No chest pain.  6. PAD: occluded left SFA with mild/mod abnormal ABIs. No revascularization was needed October 2017. No change. 7. H/o LV Thrombus: No thrombus on recent TEE.  Now on Eliquis for aflutter. No change.    8. Hyponatremia: Na 123 today in the setting of progressive renal failure. Continue free water restrict, 1200 cc/day.  9. Deconditioning: PT following. Recommends HH/PT. No change.  10. ID/UTI: Completed Rocephin for UTI 7/7. Afebrile.    11. DM2: Stable. Continue SSI.  No change.    Pt is actively dying from multiple system organ failure. Despite every effort to make this clear to family, pt remains full code and plan is for home with palliative care on dobutamine. Expect patient to rapidly decline at home, and likely return to ED. We have made it very clear that Hospice is her only option moving forward.  This has been confirmed in multidisciplinary conversations including Nephrology, Nursing, Advanced Heart Failure Team, and [facility]ation [facility] with Dr. Jaquita RectorEric Holland in the ICU.   At this point, plan is home on dobutamine with palliative care following.  Had planned for Sunday but according to patient palliative care nurse cannot come until Monday.  Will plan on Monday unless palliative care nurse  can come sooner.   Marca Anconaalton Nely Dedmon, MD  09/29/2016 8:28 AM  Advanced Heart Failure Team Pager [phone] (M-F; 7a - 4p)  Please contact [facility] for night-coverage after hours (4p -7a ) and weekends on [email or web address]

## 2016-09-30 LAB — GLUCOSE, CAPILLARY
GLUCOSE-CAPILLARY: 142 mg/dL — AB (ref 65–99)
GLUCOSE-CAPILLARY: 95 mg/dL (ref 65–99)
Glucose-Capillary: 115 mg/dL — ABNORMAL HIGH (ref 65–99)

## 2016-09-30 LAB — BASIC METABOLIC PANEL
ANION GAP: 13 (ref 5–15)
BUN: 115 mg/dL — ABNORMAL HIGH (ref 6–20)
CALCIUM: 9.3 mg/dL (ref 8.9–10.3)
CO2: 30 mmol/L (ref 22–32)
Chloride: 76 mmol/L — ABNORMAL LOW (ref 101–111)
Creatinine, Ser: 5.39 mg/dL — ABNORMAL HIGH (ref 0.44–1.00)
GFR, EST AFRICAN AMERICAN: 9 mL/min — AB (ref >60)
GFR, EST NON AFRICAN AMERICAN: 8 mL/min — AB (ref >60)
GLUCOSE: 133 mg/dL — AB (ref 65–99)
POTASSIUM: 3.8 mmol/L (ref 3.5–5.1)
SODIUM: 119 mmol/L — AB (ref 135–145)

## 2016-09-30 LAB — COOXEMETRY PANEL
Carboxyhemoglobin: 1.8 % — ABNORMAL HIGH (ref 0.5–1.5)
METHEMOGLOBIN: 1.5 % (ref 0.0–1.5)
O2 SAT: 45.2 %
Total hemoglobin: 8.8 g/dL — ABNORMAL LOW (ref 12.0–16.0)

## 2016-09-30 LAB — CBC
HCT: 24.5 % — ABNORMAL LOW (ref 36.0–46.0)
Hemoglobin: 8.5 g/dL — ABNORMAL LOW (ref 12.0–15.0)
MCH: 27.7 pg (ref 26.0–34.0)
MCHC: 34.7 g/dL (ref 30.0–36.0)
MCV: 79.8 fL (ref 78.0–100.0)
PLATELETS: 138 10*3/uL — AB (ref 150–400)
RBC: 3.07 MIL/uL — AB (ref 3.87–5.11)
RDW: 15.3 % (ref 11.5–15.5)
WBC: 5.1 10*3/uL (ref 4.0–10.5)

## 2016-09-30 NOTE — Progress Notes (Addendum)
CRITICAL VALUE ALERT  Critical Value:  Sodium 119  Date & Time Notied:  09/30/2016 at 0402  Provider Notified: Alberteen SpindleFalk, MD (Cardiology)   Orders Received/Actions taken: No new orders given

## 2016-09-30 NOTE — Progress Notes (Addendum)
Patient ID: Jessica [name], female   DOB: 01-Aug-1960, 56 y.o.   MRN: [id number]   Advanced Heart Failure Rounding Note   Subjective:    Admitted with new onset atrial flutter and ADHF. Milrinone started 6/15 for worsening HF and renal failure.  S/P TEE and successful DC-CV.   Moved to ICU on 6/19 due to low output and worsening AKI. CVVHD catheter placed. CVVHD stopped 6/27. Resumed 6/29. Then stopped 6/30  Remains off CVVHD.  Not candidate for any further CVVH or iHD per Drs Arrie Aran and [place].   Coox 45% on dobutamine 10 mcg/kg/min. Creatinine has steadily worsened. Now up to 5.4 and BUN 115. CVP remains around 15.  She continues to make urine on current torsemide though weight is up a bit today.  Sodium down to 119 today, has been slowly falling.  She remains mentally clear.    Tired, not short of breath at rest.  Only walking in her room.  More discussions with patient and family Friday, initially had planned home with palliative care and dobutamine on Sunday but now apparently palliative care nurse cannot come out until Monday, so will be Monday discharge it looks like.    Objective:   Weight Range:  Vital Signs:   Temp:  [97.4 F (36.3 C)-97.8 F (36.6 C)] 97.7 F (36.5 C) (07/22 0733) Pulse Rate:  [54-125] 54 (07/22 0700) Resp:  [11-20] 12 (07/22 0700) BP: (84-142)/(56-113) 116/68 (07/22 0700) SpO2:  [79 %-100 %] 100 % (07/22 0700) Weight:  [125 lb 6.4 oz (56.9 kg)] 125 lb 6.4 oz (56.9 kg) (07/22 0330) Last BM Date: 09/25/16  Weight change: Filed Weights   09/27/16 0500 09/28/16 0441 09/30/16 0330  Weight: 122 lb (55.3 kg) 122 lb 2.2 oz (55.4 kg) 125 lb 6.4 oz (56.9 kg)    Intake/Output:   Intake/Output Summary (Last 24 hours) at 09/30/16 0822 Last data filed at 09/30/16 0700  Gross per 24 hour  Intake            441.6 ml  Output              75 0 ml  Net           -308.4 ml     Physical Exam: CVP 15 General: Chronically ill and fatigued appearing. NAD.    HEENT: Normal except poor dentition Neck: Supple. JVP 14+ cm with prominent EJs. Cor: PMI lateral. RRR, 2/6 TR murmur. Lungs: Diminished throughout. Abdomen: Soft, non-tender, mildly distended,no HSM. No bruits or masses. +BS  Extremities: No cyanosis, clubbing, orrash. 1+ ankle edema.  Neuro: Alert & orientedx3, cranial nerves grossly intact. moves all 4 extremities w/o difficulty. Affect pleasant  Telemetry   Personally reviewed, NSR 60s, no change.   EKG   N/A   Labs: Basic Metabolic Panel:  Recent Labs Lab 09/26/16 0417 09/27/16 0500 09/28/16 0335 09/29/16 0442 09/30/16 0320  NA 125* 126* 124* 123* 119*  K 3.5 3.6 3.7 3.7 3.8  CL 82* 82* 79* 79* 76*  CO2 29 31 31  33* 30  GLUCOSE 127* 141* 146* 173* 133*  BUN 91* 98* 105* 113* 115*  CREATININE 4.52* 4.79* 5.20* 5.39* 5.39*  CALCIUM 9.4 9.5 9.8 9.6 9.3    Liver Function Tests: No results for input(s): AST, ALT, ALKPHOS, BILITOT, PROT, ALBUMIN in the last 168 hours. No results for input(s): LIPASE, AMYLASE in the last 168 hours. No results for input(s): AMMONIA in the last 168 hours.  CBC:  Recent Labs Lab 09/26/16 0417 09/27/16  0500 09/28/16 0335 09/29/16 0442 09/30/16 0320  WBC 2.8* 2.9* 4.6 4.8 5.1  HGB 8.7* 8.8* 8.6* 8.4* 8.5*  HCT 25.2* 25.9* 26.3* 25.4* 24.5*  MCV 81.0 81.7 81.9 81.7 79.8  PLT 118* 125* 134* 135* 138*   Cardiac Enzymes: No results for input(s): CKTOTAL, CKMB, CKMBINDEX, TROPONINI in the last 168 hours.  BNP: BNP (last 3 results) No results for input(s): BNP in the last 8760 hours. ProBNP (last 3 results) No results for input(s): PROBNP in the last 8760 hours.  Other results:  Imaging: No results found.   Medications:     Scheduled Medications: . amiodarone  200 mg Oral Daily  . apixaban  2.5 mg Oral BID  . Chlorhexidine Gluconate Cloth  6 each Topical Q0600  . feeding supplement  1 Container Oral TID BM  . insulin aspart  0-9 Units Subcutaneous TID WC  .  insulin aspart  2 Units Subcutaneous TID WC  . insulin glargine  10 Units Subcutaneous Daily  . metoCLOPramide  5 mg Oral TID AC & HS  . polyethylene glycol  17 g Oral Daily  . pravastatin  20 mg Oral q1800  . senna  2 tablet Oral BID  . sodium chloride flush  10-40 mL Intracatheter Q12H  . torsemide  80 mg Oral BID    Infusions: . sodium chloride Stopped (09/22/16 1000)  . DOBUTamine 10 mcg/kg/min (09/29/16 2000)    PRN Medications: acetaminophen, albuterol, chlorpheniramine-HYDROcodone, docusate sodium, hydrOXYzine, LORazepam, mineral oil-hydrophilic petrolatum, morphine CONCENTRATE, ondansetron (ZOFRAN) IV, sodium chloride flush   Patient Profile:   Jessica [name] is a 56 year old with h/o chronic systolic heart failure, ICM, Biotronik ICD, and PAH admitted new onset AFL and cardiogenic shock (EF 10%). Developed AKI requiring CVVHD.  Assessment/Plan/Discussion:   1. 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  Ischemic cardiomyopathy.  Echo 08/24/16. LVEF 10% severe RV HK (EF down further in setting of atrial flutter).  Milrinone stopped and switched to dobutamine. Remains on dobutamine 10 mcg/kg/min. She is failing this. Will not escalate support with poor prognosis. Co-ox 43.5% today, CVP around 14 still.  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  She was on CVVH for a long time this admission but stopped by renal due to poor response.   Renal function has not recovered off CVVH and she is not an iHD candidate with severe CHF. She does make some urine and we have been able to keep her weight stable.   - Multiple discussions now about lack of candidacy for LVAD, iHD, transplant.  Still some concern from children that there must be something else to do.  She wants to remain full code though willing to have palliative care follow her at home.  - She will remain on dobutamine at 10 though cardiac output remains poor.  - Continue current  torsemide 80 mg bid, she is making urine.  2. A flutter RVR: New onset June 11 with device interrogation. S/P TEE DC-CV 6/18 with conversion to NSR.  - Remains in NSR, continue amiodarone.   - Continue Eliquis 2.5 mg BID.  Stop this if she transitions to hospice.  3. Acute on chronic renal failure (baseline CKD 3) due to cardio-renal syndrome.  CVVHD started 6/20. Restarted 6/29. Held 09/09/16 with low volume status.  Per nephrology she is NOT a candidate for iHD OR restarting CVVHD. They have signed off.  - BUN and creatinine continue to worsen, she has developed a tremor.  I suspect that she is  going to have progressive renal failure at this point.  - Still making urine, continue diuretic.  4. PAH: Off sildenafil with low BP. No change.  5. CAD S/P multiple interventions. No chest pain.  6. PAD: occluded left SFA with mild/mod abnormal ABIs. No revascularization was needed October 2017. No change. 7. H/o LV Thrombus: No thrombus on recent TEE.  Now on Eliquis for aflutter. No change.    8. Hyponatremia: Na 119 today in the setting of progressive renal failure. Unfortunately with severe renal dysfunction and no HD, the most we can do at this point is be aggressive with fluid restriction.  She still appears to be getting a fair amount in po, will change fluid restriction to < 1000 cc.   9. Deconditioning: PT following. Recommends HH/PT. No change.  10. ID/UTI: Completed Rocephin for UTI 7/7. Afebrile.    11. DM2: Stable. Continue SSI.  No change.    Pt is actively dying from multiple system organ failure. Despite every effort to make this clear to family, pt remains full code and plan is for home with palliative care on dobutamine. Expect patient to rapidly decline at home, and likely return to ED. We have made it very clear that Hospice is her only option moving forward.  This has been confirmed in multidisciplinary conversations including Nephrology, Nursing, Advanced Heart Failure Team, and  Consultation with the [facility] with Dr. Jaquita Rector in the ICU.   At this point, plan is home on dobutamine with palliative care following.  Had planned for Sunday but according to patient palliative care nurse cannot come until Monday.  Will plan on Monday unless palliative care nurse can come sooner.   [name], MD  09/30/2016 8:22 AM  Advanced Heart Failure Team Pager [phone] (M-F; 7a - 4p)  Please contact [facility] for night-coverage after hours (4p -7a ) and weekends on [email or web address]

## 2016-09-30 NOTE — Progress Notes (Signed)
Case management contacted to verify that patient medical bed is being delivered to patients home and that [facility] been notified about patients need for home Dobutamine medication/pump. Will continue to monitor.

## 2016-10-01 DIAGNOSIS — N179 Acute kidney failure, unspecified: Secondary | ICD-10-CM

## 2016-10-01 LAB — GLUCOSE, CAPILLARY
GLUCOSE-CAPILLARY: 161 mg/dL — AB (ref 65–99)
GLUCOSE-CAPILLARY: 67 mg/dL (ref 65–99)
GLUCOSE-CAPILLARY: 89 mg/dL (ref 65–99)
Glucose-Capillary: 211 mg/dL — ABNORMAL HIGH (ref 65–99)
Glucose-Capillary: 211 mg/dL — ABNORMAL HIGH (ref 65–99)

## 2016-10-01 LAB — CBC
HCT: 24.8 % — ABNORMAL LOW (ref 36.0–46.0)
HEMOGLOBIN: 8.4 g/dL — AB (ref 12.0–15.0)
MCH: 26.7 pg (ref 26.0–34.0)
MCHC: 33.9 g/dL (ref 30.0–36.0)
MCV: 78.7 fL (ref 78.0–100.0)
Platelets: 153 10*3/uL (ref 150–400)
RBC: 3.15 MIL/uL — AB (ref 3.87–5.11)
RDW: 15.3 % (ref 11.5–15.5)
WBC: 5.1 10*3/uL (ref 4.0–10.5)

## 2016-10-01 LAB — BASIC METABOLIC PANEL
ANION GAP: 14 (ref 5–15)
BUN: 117 mg/dL — AB (ref 6–20)
CHLORIDE: 76 mmol/L — AB (ref 101–111)
CO2: 29 mmol/L (ref 22–32)
Calcium: 8.9 mg/dL (ref 8.9–10.3)
Creatinine, Ser: 5.53 mg/dL — ABNORMAL HIGH (ref 0.44–1.00)
GFR calc non Af Amer: 8 mL/min — ABNORMAL LOW (ref >60)
GFR, EST AFRICAN AMERICAN: 9 mL/min — AB (ref >60)
Glucose, Bld: 139 mg/dL — ABNORMAL HIGH (ref 65–99)
POTASSIUM: 4.1 mmol/L (ref 3.5–5.1)
SODIUM: 119 mmol/L — AB (ref 135–145)

## 2016-10-01 LAB — COOXEMETRY PANEL
CARBOXYHEMOGLOBIN: 1.6 % — AB (ref 0.5–1.5)
Methemoglobin: 1.3 % (ref 0.0–1.5)
O2 Saturation: 44.6 %
Total hemoglobin: 8.5 g/dL — ABNORMAL LOW (ref 12.0–16.0)

## 2016-10-01 MED ORDER — OXYCODONE HCL 5 MG/5ML PO SOLN: 5.0000 mg | ORAL | Status: DC | PRN

## 2016-10-01 MED ORDER — HYDROXYZINE HCL 25 MG PO TABS: 25.0000 mg | ORAL_TABLET | ORAL | 0 refills | Status: DC | PRN

## 2016-10-01 MED ORDER — BOOST / RESOURCE BREEZE PO LIQD: 1.0000 | Freq: Three times a day (TID) | ORAL | 0 refills | Status: AC

## 2016-10-01 MED ORDER — AMIODARONE HCL 200 MG PO TABS: 200.0000 mg | ORAL_TABLET | Freq: Every day | ORAL | 6 refills | Status: DC

## 2016-10-01 MED ORDER — OXYCODONE HCL 5 MG/5ML PO SOLN
5.0000 mg | ORAL | 0 refills | Status: DC | PRN
Start: 2016-10-01 — End: 2016-10-06

## 2016-10-01 MED ORDER — TORSEMIDE 20 MG PO TABS: 80.0000 mg | ORAL_TABLET | Freq: Two times a day (BID) | ORAL | 3 refills | Status: DC

## 2016-10-01 MED ORDER — APIXABAN 2.5 MG PO TABS: 2.5000 mg | ORAL_TABLET | Freq: Two times a day (BID) | ORAL | 6 refills | Status: DC

## 2016-10-01 MED ORDER — DOBUTAMINE IN D5W 4-5 MG/ML-% IV SOLN: 10.0000 ug/kg/min | INTRAVENOUS | 0 refills | Status: DC

## 2016-10-01 MED ORDER — POLYETHYLENE GLYCOL 3350 17 G PO PACK: 17.0000 g | PACK | Freq: Every day | ORAL | 0 refills | Status: DC

## 2016-10-01 MED ORDER — PRAVASTATIN SODIUM 20 MG PO TABS: 20.0000 mg | ORAL_TABLET | Freq: Every day | ORAL | 6 refills | Status: DC

## 2016-10-01 MED ORDER — LORAZEPAM 0.5 MG PO TABS: 0.2500 mg | ORAL_TABLET | Freq: Four times a day (QID) | ORAL | 0 refills | Status: AC | PRN

## 2016-10-01 MED ORDER — SENNA 8.6 MG PO TABS: 2.0000 | ORAL_TABLET | Freq: Two times a day (BID) | ORAL | 0 refills | Status: AC

## 2016-10-01 MED ORDER — METOCLOPRAMIDE HCL 5 MG PO TABS: 5.0000 mg | ORAL_TABLET | Freq: Three times a day (TID) | ORAL | 3 refills | Status: DC

## 2016-10-01 MED ORDER — DOCUSATE SODIUM 100 MG PO CAPS: 100.0000 mg | ORAL_CAPSULE | Freq: Every day | ORAL | 0 refills | Status: DC | PRN

## 2016-10-01 NOTE — Progress Notes (Signed)
Education given to family and patient on discharge instructions and medication regimen. Patient and family have no further questions at this time. Will continue to monitor.

## 2016-10-01 NOTE — Progress Notes (Signed)
PT Cancellation Note  Patient Details Name: Jessica MoccasinSherita Breslin MRN: [id number] DOB: Aug 20, 1960   Cancelled Treatment:    Reason Eval/Treat Not Completed: Fatigue/lethargy limiting ability to participate (pt stated fatigue and declined mobility this date)   Enedina FinnerMaija B Yaileen Hofferber 10/01/2016, 10:23 AM  Delaney MeigsMaija Tabor Cristiana Yochim, PT [phone]

## 2016-10-01 NOTE — Progress Notes (Signed)
Patient ID: Jessica [name], female   DOB: 1960/04/03, 56 y.o.   MRN: [id number]   Advanced Heart Failure Rounding Note   Subjective:    Admitted with new onset atrial flutter and ADHF. Milrinone started 6/15 for worsening HF and renal failure.  S/P TEE and successful DC-CV.   Moved to ICU on 6/19 due to low output and worsening AKI. CVVHD catheter placed. CVVHD stopped 6/27. Resumed 6/29. Then stopped 6/30  Remains off CVVHD.  Not candidate for any further CVVH or iHD per Drs Arrie Aran and Gibson.   Coox 45% on dobutamine 10 mcg/kg/min. Creatinine has steadily worsened. Now up to 5.5 and BUN 117. CVP remains around 18.  She continues to make urine on current torsemide though weight is up a bit today.  Sodium down to 119 today which is stable compared to yesterday, has been slowly falling.  She remains mentally clear but has asterixis.    Tired, not short of breath at rest.  Only walking in her room.  More discussions with patient and family Friday, initially had planned home with palliative care and dobutamine on Sunday but now apparently palliative care nurse cannot come out until Monday, so plan for discharge today.    Objective:   Weight Range:  Vital Signs:   Temp:  [96.6 F (35.9 C)-97.7 F (36.5 C)] 97.7 F (36.5 C) (07/23 0400) Pulse Rate:  [52-156] 66 (07/23 0600) Resp:  [0-29] 14 (07/23 0700) BP: (92-134)/(61-91) 112/66 (07/23 0700) SpO2:  [89 %-100 %] 89 % (07/23 0600) Weight:  [126 lb (57.2 kg)] 126 lb (57.2 kg) (07/23 0500) Last BM Date: 09/25/16  Weight change: Filed Weights   09/28/16 0441 09/30/16 0330 10/01/16 0500  Weight: 122 lb 2.2 oz (55.4 kg) 125 lb 6.4 oz (56.9 kg) 126 lb (57.2 kg)    Intake/Output:   Intake/Output Summary (Last 24 hours) at 10/01/16 0735 Last data filed at 10/01/16 0700  Gross per 24 hour  Intake            980.8 ml  Output              950 ml  Net             30 .8 ml     Physical Exam: CVP 18 General: Chronically ill and  fatigued appearing. NAD.  HEENT: Normal exceptpoor dentition Neck: Supple. JVP 14+ cm with prominent EJs. Cor: PMI lateral. RRR, 2/6 TR murmur. Lungs: Diminished throughout. Abdomen: Soft, non-tender, mildly distended,no HSM. No bruits or masses. +BS  Extremities: No cyanosis, clubbing, orrash. 1+ ankle edema.  Neuro: Alert & orientedx3, cranial nerves grossly intact. moves all 4 extremities w/o difficulty. +asterixis.   Telemetry   Personally reviewed, NSR 60s with occasional PVC   EKG   N/A   Labs: Basic Metabolic Panel:  Recent Labs Lab 09/27/16 0500 09/28/16 0335 09/29/16 0442 09/30/16 0320 10/01/16 0440  NA 126* 124* 123* 119* 119*  K 3.6 3.7 3.7 3.8 4.1  CL 82* 79* 79* 76* 76*  CO2 31 31 33* 30 29  GLUCOSE 141* 146* 173* 133* 139*  BUN 98* 105* 113* 115* 117*  CREATININE 4.79* 5.20* 5.39* 5.39* 5.53*  CALCIUM 9.5 9.8 9.6 9.3 8.9    Liver Function Tests: No results for input(s): AST, ALT, ALKPHOS, BILITOT, PROT, ALBUMIN in the last 168 hours. No results for input(s): LIPASE, AMYLASE in the last 168 hours. No results for input(s): AMMONIA in the last 168 hours.  CBC:  Recent Labs Lab  09/27/16 0500 09/28/16 0335 09/29/16 0442 09/30/16 0320 10/01/16 0440  WBC 2.9* 4.6 4.8 5.1 5.1  HGB 8.8* 8.6* 8.4* 8.5* 8.4*  HCT 25.9* 26.3* 25.4* 24.5* 24.8*  MCV 81.7 81.9 81.7 79.8 78.7  PLT 125* 134* 135* 138* 153   Cardiac Enzymes: No results for input(s): CKTOTAL, CKMB, CKMBINDEX, TROPONINI in the last 168 hours.  BNP: BNP (last 3 results) No results for input(s): BNP in the last 8760 hours. ProBNP (last 3 results) No results for input(s): PROBNP in the last 8760 hours.  Other results:  Imaging: No results found.   Medications:     Scheduled Medications: . amiodarone  200 mg Oral Daily  . apixaban  2.5 mg Oral BID  . Chlorhexidine Gluconate Cloth  6 each Topical Q0600  . feeding supplement  1 Container Oral TID BM  . insulin aspart  0-9  Units Subcutaneous TID WC  . insulin aspart  2 Units Subcutaneous TID WC  . insulin glargine  10 Units Subcutaneous Daily  . metoCLOPramide  5 mg Oral TID AC & HS  . polyethylene glycol  17 g Oral Daily  . pravastatin  20 mg Oral q1800  . senna  2 tablet Oral BID  . sodium chloride flush  10-40 mL Intracatheter Q12H  . torsemide  80 mg Oral BID    Infusions: . sodium chloride Stopped (09/22/16 1000)  . DOBUTamine 10 mcg/kg/min (10/01/16 0700)    PRN Medications: acetaminophen, albuterol, chlorpheniramine-HYDROcodone, docusate sodium, hydrOXYzine, LORazepam, mineral oil-hydrophilic petrolatum, morphine CONCENTRATE, ondansetron (ZOFRAN) IV, sodium chloride flush   Patient Profile:   Jessica [name] is a 56 year old with h/o chronic systolic heart failure, ICM, Biotronik ICD, and PAH admitted new onset AFL and cardiogenic shock (EF 10%). Developed AKI requiring CVVHD.  Assessment/Plan/Discussion:   1. 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  Ischemic cardiomyopathy.  Echo 08/24/16. LVEF 10% severe RV HK (EF down further in setting of atrial flutter).  Milrinone stopped and switched to dobutamine. Remains on dobutamine 10 mcg/kg/min. She is failing this. Will not escalate support with poor prognosis. Co-ox 45% today, CVP around 18 still.  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  She was on CVVH for a long time this admission but stopped by renal due to poor response.   Renal function has not recovered off CVVH and she is not an iHD candidate with severe CHF. She does make some urine and we have been able to keep her weight stable.   - Multiple discussions now about lack of candidacy for LVAD, iHD, transplant.  Still some concern from children that there must be something else to do.  She wants to remain full code though willing to have palliative care follow her at home.  - She will remain on dobutamine at 10 though cardiac output remains poor.   - Continue current torsemide 80 mg bid, she is making urine.  2. A flutter RVR: New onset June 11 with device interrogation. S/P TEE DC-CV 6/18 with conversion to NSR.  - Remains in NSR, continue amiodarone.   - Continue Eliquis 2.5 mg BID.  Stop this if she transitions to hospice.  3. Acute on chronic renal failure (baseline CKD 3) due to cardio-renal syndrome.  CVVHD started 6/20. Restarted 6/29. Held 09/09/16 with low volume status.  Per nephrology she is NOT a candidate for iHD OR restarting CVVHD. They have signed off.  - BUN and creatinine continue to worsen, she has developed asterixis.  I suspect that  she is going to have progressive renal failure at this point.  - Still making urine, continue diuretic.  4. PAH: Off sildenafil with low BP. No change.  5. CAD S/P multiple interventions. No chest pain.  6. PAD: occluded left SFA with mild/mod abnormal ABIs. No revascularization was needed October 2017. No change. 7. H/o LV Thrombus: No thrombus on recent TEE.  Now on Eliquis for aflutter. No change.    8. Hyponatremia: Na 119 today in the setting of progressive renal failure. Unfortunately with severe renal dysfunction and no HD, the most we can do at this point is be aggressive with fluid restriction.  Sodium stable today, per nursing did better with fluid restriction. 9. Deconditioning: PT following. Recommends HH/PT. No change.  10. ID/UTI: Completed Rocephin for UTI 7/7. Afebrile.    11. DM2: Stable. Continue SSI.  No change.    Pt is actively dying from multiple system organ failure. Despite every effort to make this clear to family, pt remains full code and plan is for home with palliative care on dobutamine. Expect patient to rapidly decline at home, and likely return to ED. We have made it very clear that Hospice is her only option moving forward.  This has been confirmed in multidisciplinary conversations including Nephrology, Nursing, Advanced Heart Failure Team, and Consultation  with the [facility] with Dr. Jaquita Rector in the ICU.   At this point, plan is home today on dobutamine with palliative care following.    [name], MD  10/01/2016 7:35 AM  Advanced Heart Failure Team Pager [phone] (M-F; 7a - 4p)  Please contact [facility] for night-coverage after hours (4p -7a ) and weekends on [email or web address]

## 2016-10-01 NOTE — Progress Notes (Signed)
[facility] ROUNDING NOTE   Subjective:   Interval History: 56 y.o. female with h/o chronic systolic heart failure, ICM, Biotronik ICD, and PAH admitted with new onset atrial flutter and Acute systolic CHF.  Per device interrogation, Afib new onset as of 08/20/16.CVVHD started 08/29/16. Responded well and taken off.  Renal function again worsened so restarted 09/07/16. Stopped again 09/08/16 with low volume status. Pt required pressor support with levophed in addition to her dobutamine to support her pressures.  poor tolerance of CVVHD and end-stage HF, Nephrology confirmed that patient was NOT a candidate for iHD or restarting CVVHD due to poor prognosis and no good end-point.  Levophed titrated off and patient tolerated dobutamine alone for several days, prior to creatinine beginning to worsen again.  Hemodynamically not stable for outpatient dialysis due to poor cardiac output This was again discussed with the family and I concur with the discussions by Dr Marisue Humble and Dr Arrie Aran  Objective:  Vital signs in last 24 hours:  Temp:  [96.6 F (35.9 C)-97.7 F (36.5 C)] 97.7 F (36.5 C) (07/23 0400) Pulse Rate:  [52-156] 56 (07/23 0800) Resp:  [10-29] 13 (07/23 0900) BP: (92-132)/(61-91) 116/73 (07/23 0900) SpO2:  [89 %-100 %] 92 % (07/23 0800) Weight:  [126 lb (57.2 kg)] 126 lb (57.2 kg) (07/23 0500)  Weight change: 9.6 oz (0.272 kg) Filed Weights   09/28/16 0441 09/30/16 0330 10/01/16 0500  Weight: 122 lb 2.2 oz (55.4 kg) 125 lb 6.4 oz (56.9 kg) 126 lb (57.2 kg)    Intake/Output: I/O last 3 completed shifts: In: 1118.8 [P.O.:760; I.V.:358.8] Out: 1500 [Urine:1500]   Intake/Output this shift:  Total I/O In: 118.4 [P.O.:100; I.V.:18.4] Out: -   Chronic ill lady CVS-  Irregular irregular with increase JVP RS- Diminished with poor effory ABD- BS present soft non-distended EXT-  1 + edema   Basic Metabolic Panel:  Recent Labs Lab 09/27/16 0500 09/28/16 0335  09/29/16 0442 09/30/16 0320 10/01/16 0440  NA 126* 124* 123* 119* 119*  K 3.6 3.7 3.7 3.8 4.1  CL 82* 79* 79* 76* 76*  CO2 31 31 33* 30 29  GLUCOSE 141* 146* 173* 133* 139*  BUN 98* 105* 113* 115* 117*  CREATININE 4.79* 5.20* 5.39* 5.39* 5.53*  CALCIUM 9.5 9.8 9.6 9.3 8.9    Liver Function Tests: No results for input(s): AST, ALT, ALKPHOS, BILITOT, PROT, ALBUMIN in the last 168 hours. No results for input(s): LIPASE, AMYLASE in the last 168 hours. No results for input(s): AMMONIA in the last 168 hours.  CBC:  Recent Labs Lab 09/27/16 0500 09/28/16 0335 09/29/16 0442 09/30/16 0320 10/01/16 0440  WBC 2.9* 4.6 4.8 5.1 5.1  HGB 8.8* 8.6* 8.4* 8.5* 8.4*  HCT 25.9* 26.3* 25.4* 24.5* 24.8*  MCV 81.7 81.9 81.7 79.8 78.7  PLT 125* 134* 135* 138* 153    Cardiac Enzymes: No results for input(s): CKTOTAL, CKMB, CKMBINDEX, TROPONINI in the last 168 hours.  BNP: Invalid input(s): POCBNP  CBG:  Recent Labs Lab 09/30/16 1200 09/30/16 1659 09/30/16 2122 10/01/16 0724 10/01/16 1137  GLUCAP 95 115* 161* 211* 67    Microbiology: Results for orders placed or performed during the hospital encounter of 08/22/16  MRSA PCR Screening     Status: None   Collection Time: 08/31/16  9:38 AM  Result Value Ref Range Status   MRSA by PCR NEGATIVE NEGATIVE Final    Comment:        The GeneXpert MRSA Assay (FDA approved for NASAL  specimens only), is one component of a comprehensive MRSA colonization surveillance program. It is not intended to diagnose MRSA infection nor to guide or monitor treatment for MRSA infections.   Culture, blood (routine x 2)     Status: None   Collection Time: 09/10/16  9:44 AM  Result Value Ref Range Status   Specimen Description BLOOD RIGHT ANTECUBITAL  Final   Special Requests IN PEDIATRIC BOTTLE Blood Culture adequate volume  Final   Culture NO GROWTH 5 DAYS  Final   Report Status 09/15/2016 FINAL  Final  Culture, blood (routine x 2)     Status:  None   Collection Time: 09/10/16  9:44 AM  Result Value Ref Range Status   Specimen Description BLOOD BLOOD RIGHT HAND  Final   Special Requests IN PEDIATRIC BOTTLE Blood Culture adequate volume  Final   Culture NO GROWTH 5 DAYS  Final   Report Status 09/15/2016 FINAL  Final  Culture, Urine     Status: Abnormal   Collection Time: 09/11/16 11:23 AM  Result Value Ref Range Status   Specimen Description URINE, CLEAN CATCH  Final   Special Requests NONE  Final   Culture <10,000 COLONIES/mL INSIGNIFICANT GROWTH (A)  Final   Report Status 09/12/2016 FINAL  Final    Coagulation Studies: No results for input(s): LABPROT, INR in the last 72 hours.  Urinalysis: No results for input(s): COLORURINE, LABSPEC, PHURINE, GLUCOSEU, HGBUR, BILIRUBINUR, KETONESUR, PROTEINUR, UROBILINOGEN, NITRITE, LEUKOCYTESUR in the last 72 hours.  Invalid input(s): APPERANCEUR    Imaging: No results found.   Medications:   . sodium chloride Stopped (09/22/16 1000)  . DOBUTamine 10 mcg/kg/min (10/01/16 0700)   . amiodarone  200 mg Oral Daily  . apixaban  2.5 mg Oral BID  . Chlorhexidine Gluconate Cloth  6 each Topical Q0600  . feeding supplement  1 Container Oral TID BM  . insulin aspart  0-9 Units Subcutaneous TID WC  . insulin aspart  2 Units Subcutaneous TID WC  . insulin glargine  10 Units Subcutaneous Daily  . metoCLOPramide  5 mg Oral TID AC & HS  . polyethylene glycol  17 g Oral Daily  . pravastatin  20 mg Oral q1800  . senna  2 tablet Oral BID  . sodium chloride flush  10-40 mL Intracatheter Q12H  . torsemide  80 mg Oral BID   acetaminophen, albuterol, chlorpheniramine-HYDROcodone, docusate sodium, hydrOXYzine, LORazepam, mineral oil-hydrophilic petrolatum, ondansetron (ZOFRAN) IV, oxyCODONE, sodium chloride flush  Assessment/ Plan:   Decompensating renal function in the face of poor cardiac function  I do not believe that that addition of CRRT is going to be a long term solution and poor  tolerability leads me to think that outpatient dialysis is not going to be an option. Blood pressures are hard to maintain in hospital and suspect that palliative care is only solution. I discussed this with daughter   LOS: [id number] Natanel Snavely W @TODAY @12 :39 PM

## 2016-10-01 NOTE — Progress Notes (Signed)
Pt discharged out of facility with O2 and wheelchair provided by [facility]. Pt's son and daughter with her upon getting in car.

## 2016-10-01 NOTE — Progress Notes (Signed)
ANTICOAGULATION CONSULT NOTE - Follow Up Consult  Pharmacy Consult for Heparin > apixaban Indication: atrial fibrillation  Patient Measurements: Height: 5\' 5"  (165.1 cm) Weight: 126 lb (57.2 kg) IBW/kg (Calculated) : 57  Assessment: 56yoF previously on eliquis for afib, s/p DCCV on 6/18, transitioned to IV heparin 6/20 anticipating CVVHD. CVVHD stopped 6/27, temp IJ catheter placed 6/28, heparin resumed, and CVVHD resumed 6/29. CRRT stopped again 7/1.  Heparin stopped and switched back to apixaban for pan to dc home with pall care. Lower dose apixaban for age/wt/Cr  CBC low-stable and no bleeding per documentation.    Goal of Therapy:   Monitor platelets by anticoagulation protocol: Yes   Plan:  Apixaban 2.5mg  BID Monitor for s/s bleeding  Jessica [name] Pharm.D. CPP, BCPS Clinical Pharmacist [phone] 10/01/2016 7:42 AM

## 2016-10-01 NOTE — Progress Notes (Signed)
No charge note:  Patient suffers from chronic heart failure/home hospice which impairs their ability to perform daily activities like bathing, dressing, feeding and grooming in the home.  A walker will not resolve   issue with performing activities of daily living. A wheelchair will allow patient to safely perform daily activities. Patient can safely propel the wheelchair in the home or has a caregiver who can provide assistance.  Accessories: elevating leg rests (ELRs), wheel locks, extensions and anti-tippers.   Ocie BobKasie Mahan, AGNP-C Palliative Medicine

## 2016-10-01 NOTE — Progress Notes (Signed)
CRITICAL VALUE ALERT  Critical Value:  Sodium 119  Date & Time Notied:  10/01/16 at 0520  Provider Notified: Cristal Deerhristopher, MD (Cardiology)   Orders Received/Actions taken: No new orders received   Halina MaidensAna E Nino Combs, RN

## 2016-10-01 NOTE — Progress Notes (Addendum)
Daily Progress Note   Patient Name: Jessica [name]       Date: 10/01/2016 DOB: 19-Dec-1960  Age: 56 y.o. MRN#: [id number] Attending Physician: Dolores Patty, MD Primary Care Physician: Patient, No Pcp Per Admit Date: 08/22/2016  Reason for Consultation/Follow-up: Establishing goals of care, Non pain symptom management and Psychosocial/spiritual support  Subjective: Going home today around one with dobutamine infusion, home health, outpatient palliative service. Looking forward to being at her daughter's home. Says she is sleepy and doesn't want to talk. Cr steadily trending up, today 5.53, hyponatremic. Per conversation with Otilio Saber, PA and notes plan is for patient to d/c home with Palliative and transition to Hospice.   ROS  Length of Stay: 40  Current Medications: Scheduled Meds:  . amiodarone  200 mg Oral Daily  . apixaban  2.5 mg Oral BID  . Chlorhexidine Gluconate Cloth  6 each Topical Q0600  . feeding supplement  1 Container Oral TID BM  . insulin aspart  0-9 Units Subcutaneous TID WC  . insulin aspart  2 Units Subcutaneous TID WC  . insulin glargine  10 Units Subcutaneous Daily  . metoCLOPramide  5 mg Oral TID AC & HS  . polyethylene glycol  17 g Oral Daily  . pravastatin  20 mg Oral q1800  . senna  2 tablet Oral BID  . sodium chloride flush  10-40 mL Intracatheter Q12H  . torsemide  80 mg Oral BID    Continuous Infusions: . sodium chloride Stopped (09/22/16 1000)  . DOBUTamine 10 mcg/kg/min (10/01/16 0700)    PRN Meds: acetaminophen, albuterol, chlorpheniramine-HYDROcodone, docusate sodium, hydrOXYzine, LORazepam, mineral oil-hydrophilic petrolatum, morphine CONCENTRATE, ondansetron (ZOFRAN) IV, sodium chloride flush  Physical Exam          Vital Signs:  BP 116/73   Pulse (!) 56   Temp 97.7 F (36.5 C) (Oral)   Resp 13   Ht 5\' 5"  (1.651 m)   Wt 57.2 kg (126 lb)   SpO2 92%   BMI 20.97 kg/m  SpO2: SpO2: 92 % O2 Device: O2 Device: Nasal Cannula O2 Flow Rate: O2 Flow Rate (L/min): 2 L/min  Intake/output summary:  Intake/Output Summary (Last 24 hours) at 10/01/16 4098 Last data filed at 10/01/16 0900  Gross per 24 hour  Intake  1099.2 ml  Output              950 ml  Net            149.2 ml   LBM: Last BM Date: 09/25/16 Baseline Weight: Weight: 53.7 kg (118 lb 6.4 oz) Most recent weight: Weight: 57.2 kg (126 lb)       Palliative Assessment/Data: PPS: 50%   Flowsheet Rows     Most Recent Value  Intake Tab  Referral Department  Cardiology  Unit at Time of Referral  ICU  Palliative Care Primary Diagnosis  Cardiac  Date Notified  09/18/16  Palliative Care Type  New Palliative care  Reason for referral  Clarify Goals of Care  Date of Admission  09/18/16  Date first seen by Palliative Care  09/18/16  # of days Palliative referral response time  0 Day(s)  # of days IP prior to Palliative referral  0  Clinical Assessment  Psychosocial & Spiritual Assessment  Palliative Care Outcomes      Patient Active Problem List   Diagnosis Date Noted  . Palliative care by specialist   . Goals of care, counseling/discussion   . Advance care planning   . Cardiogenic shock (HCC) 08/30/2016  . Typical atrial flutter (HCC)   . Atrial fibrillation with RVR (HCC) 08/22/2016  . CAD (coronary artery disease) 01/15/2015  . Impetigo 01/15/2015  . AKI (acute kidney injury) (HCC) 01/15/2015  . Hyponatremia 01/15/2015  . Hypomagnesemia 01/15/2015  . Diabetes mellitus type 2, uncontrolled (HCC) 01/15/2015  . Acute on chronic systolic (congestive) heart failure (HCC) 01/13/2015  . Chronic systolic CHF (congestive heart failure) (HCC) 05/05/2014  . Ischemic cardiomyopathy 05/05/2014  . COPD (chronic obstructive pulmonary disease)  (HCC) 05/05/2014  . Encounter for therapeutic drug monitoring 04/28/2014  . LV (left ventricular) mural thrombus (HCC) 03/25/2014  . Rheumatoid factor positive 10/26/2013  . OSA (obstructive sleep apnea) 09/03/2013  . Pulmonary HTN (HCC) 09/03/2013  . PAD (peripheral artery disease) (HCC) 10/06/2012  . Chest pain 07/03/2012  . VT (ventricular tachycardia) (HCC) 12/20/2011  . Depression with anxiety 04/30/2011  . Rash 08/24/2010  . Deep vein thrombosis of lower leg (HCC) 06/28/2010  . HYPOPOTASSEMIA 12/24/2008  . HYPERCHOLESTEROLEMIA 07/15/2008  . NICOTINE ADDICTION 07/15/2008  . DYSPNEA 03/21/2008  . NONSPECIFIC LOW BP 03/21/2008  . ICD (implantable cardioverter-defibrillator) in place 03/21/2008    Palliative Care Assessment & Plan   Patient Profile: 56 y.o.femalewith past medical history of chronic systolic heart failure, CAD, ICD, DM, hyperlipidemia, PAD, DVT, LV thrombus, implanted cardiac monitoradmitted on 6/13/2018with new onset of atrial fibrillation with RVR detected by her ICM and increasing dyspnea. During this admission she was started on milrinone for worsening HF and renal failure. She required CVVHD for AKI/CKI d/t cardiorenal syndrome, this was held on 6/30. There was some temporary improvement in kidney function, but BUN/Cr is again starting to climb. She is not candidate for IHD d/t severe CMP (EF 10%). She has been switched from milrinone to dobutamine, has been weaned from norepi. Overall very poor prognosis. Co-ox decreasing and cr rising. Palliative medicine consulted for GOC, nonpain symptom managment.  Assessment/Recommendations/Plan   Continues to decline, but refuses further GOC discussion. Plans for discharge today around 1pm. Probably becoming more uremic with increasing lethargy.   Will switch morphine to oxycodone due to possible increased side effects with uremia.  I spoke with [name] and [name] and they stated that [facility] was coming to  their home today  not Palliative.   Goals of Care and Additional Recommendations:  Limitations on Scope of Treatment: Full Scope Treatment  Code Status:  Full code  Prognosis:   < 4 weeks  Discharge Planning:  Home with Palliative Services  Care plan was discussed with patient and Kriste BasqueBecky, RN.  Thank you for allowing the Palliative Medicine Team to assist in the care of this patient.   Total time: 25 minutes  Greater than 50%  of this time was spent counseling and coordinating care related to the above assessment and plan.  Ocie BobKasie Nomi Rudnicki, AGNP-C Palliative Medicine   Please contact Palliative Medicine Team phone at [phone] for questions and concerns.

## 2016-10-01 NOTE — Progress Notes (Signed)
Pt's daughter at bedside. Daughter expressing frustration because they still have not received an oxygen tank and wheelchair for patient's discharge home with home health and palliative. Order placed for wheelchair and oxygen. Case management notified of new orders. Will continue to monitor.

## 2016-10-01 NOTE — Progress Notes (Addendum)
SATURATION QUALIFICATIONS: (This note is used to comply with regulatory documentation for home oxygen)  Patient Saturations on Room Air at Rest = 87%  Patient Saturations on Room Air while Ambulating = 85%  Patient Saturations on 2 Liters of oxygen while Ambulating = 95%  Please briefly explain why patient needs home oxygen: Chronic heart failure with desaturation with ambulation. Patient to discharge home with home health and palliative care.

## 2016-10-02 DIAGNOSIS — I2510 Atherosclerotic heart disease of native coronary artery without angina pectoris: Secondary | ICD-10-CM | POA: Diagnosis not present

## 2016-10-02 DIAGNOSIS — Z452 Encounter for adjustment and management of vascular access device: Secondary | ICD-10-CM | POA: Diagnosis not present

## 2016-10-02 DIAGNOSIS — I480 Paroxysmal atrial fibrillation: Secondary | ICD-10-CM | POA: Diagnosis not present

## 2016-10-02 MED FILL — LORazepam 0.5 MG TABS: 0.5 | 8 days supply | Qty: 30 | Fill #0

## 2016-10-02 MED FILL — oxyCODONE HCL 5 MG/5ML SOLN: 5 | 1 days supply | Qty: 50 | Fill #0

## 2016-10-03 ENCOUNTER — Emergency Department (HOSPITAL_COMMUNITY): Payer: Medicare Other

## 2016-10-03 ENCOUNTER — Encounter (HOSPITAL_COMMUNITY): Payer: Self-pay | Admitting: Emergency Medicine

## 2016-10-03 ENCOUNTER — Inpatient Hospital Stay (HOSPITAL_COMMUNITY)
Admission: EM | Admit: 2016-10-03 | Discharge: 2016-10-06 | DRG: 291 | Disposition: A | Discharge status: Hospice | Payer: Medicare Other | Attending: Internal Medicine | Admitting: Internal Medicine

## 2016-10-03 DIAGNOSIS — N189 Chronic kidney disease, unspecified: Secondary | ICD-10-CM

## 2016-10-03 DIAGNOSIS — I5084 End stage heart failure: Secondary | ICD-10-CM | POA: Diagnosis present

## 2016-10-03 DIAGNOSIS — K5909 Other constipation: Secondary | ICD-10-CM | POA: Diagnosis present

## 2016-10-03 DIAGNOSIS — Z95810 Presence of automatic (implantable) cardiac defibrillator: Secondary | ICD-10-CM | POA: Diagnosis present

## 2016-10-03 DIAGNOSIS — E871 Hypo-osmolality and hyponatremia: Secondary | ICD-10-CM | POA: Diagnosis present

## 2016-10-03 DIAGNOSIS — Z955 Presence of coronary angioplasty implant and graft: Secondary | ICD-10-CM

## 2016-10-03 DIAGNOSIS — Z86718 Personal history of other venous thrombosis and embolism: Secondary | ICD-10-CM

## 2016-10-03 DIAGNOSIS — R0602 Shortness of breath: Secondary | ICD-10-CM | POA: Diagnosis not present

## 2016-10-03 DIAGNOSIS — I5023 Acute on chronic systolic (congestive) heart failure: Secondary | ICD-10-CM | POA: Diagnosis present

## 2016-10-03 DIAGNOSIS — R64 Cachexia: Secondary | ICD-10-CM | POA: Diagnosis present

## 2016-10-03 DIAGNOSIS — N179 Acute kidney failure, unspecified: Secondary | ICD-10-CM | POA: Diagnosis not present

## 2016-10-03 DIAGNOSIS — I255 Ischemic cardiomyopathy: Secondary | ICD-10-CM | POA: Diagnosis present

## 2016-10-03 DIAGNOSIS — I132 Hypertensive heart and chronic kidney disease with heart failure and with stage 5 chronic kidney disease, or end stage renal disease: Principal | ICD-10-CM | POA: Diagnosis present

## 2016-10-03 DIAGNOSIS — Z794 Long term (current) use of insulin: Secondary | ICD-10-CM

## 2016-10-03 DIAGNOSIS — Z515 Encounter for palliative care: Secondary | ICD-10-CM | POA: Diagnosis not present

## 2016-10-03 DIAGNOSIS — I252 Old myocardial infarction: Secondary | ICD-10-CM | POA: Diagnosis not present

## 2016-10-03 DIAGNOSIS — N185 Chronic kidney disease, stage 5: Secondary | ICD-10-CM | POA: Diagnosis present

## 2016-10-03 DIAGNOSIS — I272 Pulmonary hypertension, unspecified: Secondary | ICD-10-CM | POA: Diagnosis present

## 2016-10-03 DIAGNOSIS — F17290 Nicotine dependence, other tobacco product, uncomplicated: Secondary | ICD-10-CM | POA: Diagnosis present

## 2016-10-03 DIAGNOSIS — E119 Type 2 diabetes mellitus without complications: Secondary | ICD-10-CM

## 2016-10-03 DIAGNOSIS — Z66 Do not resuscitate: Secondary | ICD-10-CM | POA: Diagnosis present

## 2016-10-03 DIAGNOSIS — Z6820 Body mass index (BMI) 20.0-20.9, adult: Secondary | ICD-10-CM

## 2016-10-03 DIAGNOSIS — E785 Hyperlipidemia, unspecified: Secondary | ICD-10-CM | POA: Diagnosis present

## 2016-10-03 DIAGNOSIS — E1122 Type 2 diabetes mellitus with diabetic chronic kidney disease: Secondary | ICD-10-CM | POA: Diagnosis present

## 2016-10-03 DIAGNOSIS — J9621 Acute and chronic respiratory failure with hypoxia: Secondary | ICD-10-CM | POA: Diagnosis present

## 2016-10-03 DIAGNOSIS — N186 End stage renal disease: Secondary | ICD-10-CM | POA: Diagnosis not present

## 2016-10-03 DIAGNOSIS — I509 Heart failure, unspecified: Secondary | ICD-10-CM | POA: Insufficient documentation

## 2016-10-03 DIAGNOSIS — K219 Gastro-esophageal reflux disease without esophagitis: Secondary | ICD-10-CM | POA: Diagnosis present

## 2016-10-03 DIAGNOSIS — E118 Type 2 diabetes mellitus with unspecified complications: Secondary | ICD-10-CM

## 2016-10-03 DIAGNOSIS — R110 Nausea: Secondary | ICD-10-CM | POA: Diagnosis present

## 2016-10-03 DIAGNOSIS — G4730 Sleep apnea, unspecified: Secondary | ICD-10-CM | POA: Diagnosis present

## 2016-10-03 DIAGNOSIS — J449 Chronic obstructive pulmonary disease, unspecified: Secondary | ICD-10-CM | POA: Diagnosis present

## 2016-10-03 DIAGNOSIS — I2510 Atherosclerotic heart disease of native coronary artery without angina pectoris: Secondary | ICD-10-CM | POA: Diagnosis present

## 2016-10-03 DIAGNOSIS — I82409 Acute embolism and thrombosis of unspecified deep veins of unspecified lower extremity: Secondary | ICD-10-CM | POA: Insufficient documentation

## 2016-10-03 DIAGNOSIS — Z7901 Long term (current) use of anticoagulants: Secondary | ICD-10-CM | POA: Diagnosis not present

## 2016-10-03 DIAGNOSIS — I5082 Biventricular heart failure: Secondary | ICD-10-CM | POA: Diagnosis present

## 2016-10-03 DIAGNOSIS — I4892 Unspecified atrial flutter: Secondary | ICD-10-CM | POA: Diagnosis present

## 2016-10-03 DIAGNOSIS — Z8679 Personal history of other diseases of the circulatory system: Secondary | ICD-10-CM

## 2016-10-03 HISTORY — DX: Acute respiratory failure, unspecified whether with hypoxia or hypercapnia: J96.00

## 2016-10-03 HISTORY — DX: Nausea: R11.0

## 2016-10-03 LAB — I-STAT TROPONIN, ED: Troponin i, poc: 0.1 ng/mL (ref 0.00–0.08)

## 2016-10-03 LAB — CBC WITH DIFFERENTIAL/PLATELET
BASOS ABS: 0 10*3/uL (ref 0.0–0.1)
BASOS PCT: 0 %
Eosinophils Absolute: 0 10*3/uL (ref 0.0–0.7)
Eosinophils Relative: 0 %
HEMATOCRIT: 25.2 % — AB (ref 36.0–46.0)
Hemoglobin: 8.5 g/dL — ABNORMAL LOW (ref 12.0–15.0)
LYMPHS PCT: 8 %
Lymphs Abs: 0.4 10*3/uL — ABNORMAL LOW (ref 0.7–4.0)
MCH: 26.6 pg (ref 26.0–34.0)
MCHC: 33.7 g/dL (ref 30.0–36.0)
MCV: 78.8 fL (ref 78.0–100.0)
Monocytes Absolute: 0.1 10*3/uL (ref 0.1–1.0)
Monocytes Relative: 3 %
NEUTROS ABS: 4.2 10*3/uL (ref 1.7–7.7)
Neutrophils Relative %: 89 %
Platelets: 152 10*3/uL (ref 150–400)
RBC: 3.2 MIL/uL — AB (ref 3.87–5.11)
RDW: 15.1 % (ref 11.5–15.5)
WBC: 4.7 10*3/uL (ref 4.0–10.5)

## 2016-10-03 LAB — TROPONIN I: TROPONIN I: 0.08 ng/mL — AB (ref <0.03)

## 2016-10-03 LAB — BASIC METABOLIC PANEL
ANION GAP: 14 (ref 5–15)
BUN: 129 mg/dL — ABNORMAL HIGH (ref 6–20)
CALCIUM: 9.2 mg/dL (ref 8.9–10.3)
CO2: 28 mmol/L (ref 22–32)
Chloride: 75 mmol/L — ABNORMAL LOW (ref 101–111)
Creatinine, Ser: 6.06 mg/dL — ABNORMAL HIGH (ref 0.44–1.00)
GFR, EST AFRICAN AMERICAN: 8 mL/min — AB (ref >60)
GFR, EST NON AFRICAN AMERICAN: 7 mL/min — AB (ref >60)
GLUCOSE: 106 mg/dL — AB (ref 65–99)
POTASSIUM: 4 mmol/L (ref 3.5–5.1)
Sodium: 117 mmol/L — CL (ref 135–145)

## 2016-10-03 LAB — CBG MONITORING, ED: Glucose-Capillary: 159 mg/dL — ABNORMAL HIGH (ref 65–99)

## 2016-10-03 LAB — GLUCOSE, CAPILLARY: Glucose-Capillary: 131 mg/dL — ABNORMAL HIGH (ref 65–99)

## 2016-10-03 LAB — D-DIMER, QUANTITATIVE (NOT AT ARMC): D DIMER QUANT: 1.99 ug{FEU}/mL — AB (ref 0.00–0.50)

## 2016-10-03 LAB — BRAIN NATRIURETIC PEPTIDE: B Natriuretic Peptide: 2424.2 pg/mL — ABNORMAL HIGH (ref 0.0–100.0)

## 2016-10-03 MED ORDER — DOCUSATE SODIUM 100 MG PO CAPS: 100.0000 mg | ORAL_CAPSULE | Freq: Every day | ORAL | Status: DC | PRN

## 2016-10-03 MED ORDER — AMIODARONE HCL 200 MG PO TABS: 200.0000 mg | ORAL_TABLET | Freq: Every day | ORAL | Status: DC

## 2016-10-03 MED ORDER — ALBUTEROL SULFATE (2.5 MG/3ML) 0.083% IN NEBU: 2.5000 mg | INHALATION_SOLUTION | Freq: Four times a day (QID) | RESPIRATORY_TRACT | Status: DC | PRN

## 2016-10-03 MED ORDER — DOBUTAMINE IN D5W 4-5 MG/ML-% IV SOLN
10.0000 ug/kg/min | INTRAVENOUS | Status: AC
  Administered 2016-10-03 – 2016-10-05 (×3): 10 ug/kg/min via INTRAVENOUS
  Filled 2016-10-03 (×3): qty 250

## 2016-10-03 MED ORDER — FLEET ENEMA 7-19 GM/118ML RE ENEM: 1.0000 | ENEMA | Freq: Once | RECTAL | Status: DC | PRN

## 2016-10-03 MED ORDER — LORAZEPAM 0.5 MG PO TABS
0.2500 mg | ORAL_TABLET | Freq: Four times a day (QID) | ORAL | Status: DC | PRN
Start: 2016-10-03 — End: 2016-10-03

## 2016-10-03 MED ORDER — HYDROXYZINE HCL 25 MG PO TABS: 25.0000 mg | ORAL_TABLET | ORAL | Status: DC | PRN

## 2016-10-03 MED ORDER — METOCLOPRAMIDE HCL 10 MG PO TABS: 5.0000 mg | ORAL_TABLET | Freq: Three times a day (TID) | ORAL | Status: DC

## 2016-10-03 MED ORDER — ASPIRIN 81 MG PO CHEW
324.0000 mg | CHEWABLE_TABLET | Freq: Once | ORAL | Status: DC
  Filled 2016-10-03: qty 4

## 2016-10-03 MED ORDER — SODIUM CHLORIDE 0.9 % IV SOLN
INTRAVENOUS | Status: DC
  Administered 2016-10-03: 14:00:00 via INTRAVENOUS

## 2016-10-03 MED ORDER — TORSEMIDE 20 MG PO TABS: 80.0000 mg | ORAL_TABLET | Freq: Two times a day (BID) | ORAL | Status: DC

## 2016-10-03 MED ORDER — BISACODYL 5 MG PO TBEC: 5.0000 mg | DELAYED_RELEASE_TABLET | Freq: Every day | ORAL | Status: DC | PRN

## 2016-10-03 MED ORDER — ACETAMINOPHEN 325 MG PO TABS: 650.0000 mg | ORAL_TABLET | Freq: Four times a day (QID) | ORAL | Status: DC | PRN

## 2016-10-03 MED ORDER — ACETAMINOPHEN 650 MG RE SUPP: 650.0000 mg | Freq: Four times a day (QID) | RECTAL | Status: DC | PRN

## 2016-10-03 MED ORDER — LORAZEPAM 2 MG/ML IJ SOLN
1.0000 mg | INTRAMUSCULAR | Status: DC | PRN
  Administered 2016-10-03: 1 mg via INTRAVENOUS
  Filled 2016-10-03: qty 1

## 2016-10-03 MED ORDER — BOOST / RESOURCE BREEZE PO LIQD
1.0000 | Freq: Three times a day (TID) | ORAL | Status: DC
  Administered 2016-10-03: 1 via ORAL

## 2016-10-03 MED ORDER — PROMETHAZINE HCL 25 MG/ML IJ SOLN: 6.2500 mg | Freq: Four times a day (QID) | INTRAMUSCULAR | Status: DC | PRN

## 2016-10-03 MED ORDER — SENNA 8.6 MG PO TABS: 2.0000 | ORAL_TABLET | Freq: Two times a day (BID) | ORAL | Status: DC

## 2016-10-03 MED ORDER — MORPHINE SULFATE (PF) 4 MG/ML IV SOLN
1.0000 mg | INTRAVENOUS | Status: DC | PRN
  Administered 2016-10-03: 3 mg via INTRAVENOUS
  Filled 2016-10-03: qty 1

## 2016-10-03 MED ORDER — INSULIN ASPART 100 UNIT/ML ~~LOC~~ SOLN
0.0000 [IU] | Freq: Three times a day (TID) | SUBCUTANEOUS | Status: DC
Start: 2016-10-03 — End: 2016-10-03
  Administered 2016-10-03: 1 [IU] via SUBCUTANEOUS

## 2016-10-03 MED ORDER — FUROSEMIDE 10 MG/ML IJ SOLN: 80.0000 mg | Freq: Once | INTRAMUSCULAR | Status: DC

## 2016-10-03 MED ORDER — OXYCODONE HCL 5 MG/5ML PO SOLN: 5.0000 mg | ORAL | Status: DC | PRN

## 2016-10-03 MED ORDER — POLYETHYLENE GLYCOL 3350 17 G PO PACK: 17.0000 g | PACK | Freq: Every day | ORAL | Status: DC

## 2016-10-03 MED ORDER — OXYCODONE HCL 5 MG/5ML PO SOLN: 5.0000 mg | ORAL | Status: DC

## 2016-10-03 MED ORDER — HEPARIN SOD (PORK) LOCK FLUSH 100 UNIT/ML IV SOLN
500.0000 [IU] | INTRAVENOUS | Status: DC | PRN
  Administered 2016-10-06: 500 [IU]

## 2016-10-03 MED ORDER — ONDANSETRON HCL 4 MG/2ML IJ SOLN
4.0000 mg | Freq: Four times a day (QID) | INTRAMUSCULAR | Status: DC | PRN
  Administered 2016-10-06: 4 mg via INTRAVENOUS
  Filled 2016-10-03: qty 2

## 2016-10-03 MED ORDER — INSULIN ASPART 100 UNIT/ML ~~LOC~~ SOLN: 0.0000 [IU] | Freq: Every day | SUBCUTANEOUS | Status: DC

## 2016-10-03 MED ORDER — APIXABAN 2.5 MG PO TABS: 2.5000 mg | ORAL_TABLET | Freq: Two times a day (BID) | ORAL | Status: DC

## 2016-10-03 NOTE — Progress Notes (Signed)
Nutrition Brief Note  Chart reviewed. Pt now transitioning to comfort care.  No further nutrition interventions warranted at this time.  Please re-consult as needed.   Wai Litt A. Eleny Cortez, RD, LDN, CDE Pager: [phone] After hours Pager: [phone]  

## 2016-10-03 NOTE — Progress Notes (Signed)
PT arrived onto floor. No c/o pain at the moment. A&O x4. Pt instructed to call for help when needed and verbalizes understanding. Family present at the bedside.          SKIN ASSESSMENT DONE BY Kandace Blitziauna Braylin Formby, RN and Christel MormonZee, RN

## 2016-10-03 NOTE — Progress Notes (Addendum)
Returned to the room with Maxine GlennMichael Tillery, PA-C of cardiology. Reinforce poor prognosis, worsening renal function and cardiac status despite utilization of dobutamine infusion. Patient agrees further treatment is futile noting current medications are also contributing to symptoms of nausea and tremors. Patient desires natural death with comfort measures. Discussed at length with family who agree. Several family members not available at this time so plan is to continue dobutamine infusion until a.m. and otherwise treat mild to moderate respiratory anxiety symptoms with IV morphine prn and IV Ativan prn with likely transition to morphine infusion after dobutamine infusion discontinued and patient develops expected, more significant respiratory symptoms. We have also discontinued any unnecessary medications or treatments. I have discussed this with the palliative medicine team as well.  CONSIDER DEACTIVATION OF AICD IF NOT YET ACCOMPLISHED  Junious SilkAllison Zyriah Mask, ANP

## 2016-10-03 NOTE — ED Notes (Signed)
Patient transported to X-ray 

## 2016-10-03 NOTE — Progress Notes (Signed)
  Seen with Mrs. Rennis Hardingllis of Internal Medicine team. We were asked to see with end stage HF home on dobutamine with palliative considerations. Pt became worse overnight with acute SOB, and family uncomfortable managing at home ( They had, up to this point, refused hospice)  Re-iterated conversations had from previous admission. Pt and family understand that she is worsening, and that there are no further treatment options available for her. Family more open to this conversation with worsening at home, and able to verbalize, for the first time that they understand she is dying.   We discussed transition to comfort care, and that this would likely mean a prognosis of hours to days.  Family agrees, but wish to continue dobutamine until tomorrow am so all family members can arrive. They agree to prn IV morphine and ativan for SOB and agitation. They also understand that patient may continue to worsen and require escalation of comfort meds prior to all family arriving.    Will need to deactivate AICD therapy in am.  Would recommend discontinuation of labwork and all non-essential meds. OK to stop Anticoagulation and Amiodarone.   We will follow from a distance to allow family proper time to grieve, as we have no further treatment options and prognosis has been made clear. Please call us back if you have any further questions or if we can continue to help with this patients transition. We will follow patients chart. Anticipate hospital death.   All above discussed with Dr. Shirlee LatchMcLean.  Graciella FreerMichael Andrew Tillery, PA-C  10/03/16

## 2016-10-03 NOTE — ED Notes (Addendum)
Plan of care and admission explained by Dr. Elesa MassedWard to pt. and family .

## 2016-10-03 NOTE — Progress Notes (Signed)
Palliative Medicine RN Note: Pt seen in ED. She was d/c 7/23 after a prolonged hospitalization (>30days) and [facility] and home dobutamine (home hospice not possible with dobutamine, and home palliative is limited in their interventions). Returned to ED overnight, per pt was having trouble breathing. She is on O2 here and wears O2 at home. Reports that she has Ativan at home for SOB but didn't take it. Extensive cardiac hx, cardiorenal syndrome, AICD, pulmonary artery HTN, CKD 5 (cr 6.6), DM2 per provider notes.   Currently waiting on cardiology to come and for a bed assignment, but there are few beds available, so she may be in the ED for a while. Full breakfast tray unopened on table; reports she isn't hungry, but ate a doughnut this am.   PMT plan: f/u after cardiology makes recommendations. Although pt has a DNR order, this does not mean do not treat, and it would be most appropriate for us to see this pt after cardiology makes recs. TRH notes that pt now wants to be in the hospital, but there may not be an indication if we have her on her home regimen; will need d/c plan after cardiology sees her. Patient was seen by PMT during her last admission.  Margret ChanceMelanie G. Makael Stein, RN, BSN, [facility] 10/03/2016 9:34 AM Cell [phone] 8:00-4:00 Monday-Friday Office [phone]

## 2016-10-03 NOTE — Progress Notes (Signed)
Called pharmacy about dobutamine drip and regarding this patient, pharmacist instructed this writer that if we switch to our own pump, will follow the rate stated in the epic which is 9.542ml/hr. Will inform primary nurse.

## 2016-10-03 NOTE — H&P (Signed)
History and Physical    Jessica [name] ONG:[id number] DOB: 1961/02/26 DOA: 10/03/2016   PCP: Patient, No Pcp Per   Attending physician: Konrad Dolores  Patient coming from/Resides with: Private residence/family  Chief Complaint: Shortness of breath and nausea  HPI: Jessica [name] is a 56 y.o. female with medical history significant for atrial flutter currently maintaining sinus rhythm, pulmonary hypertension, CAD, diabetes, end-stage systolic biventricular heart failure with recent cardiogenic shock, chronic kidney disease secondary to cardiorenal syndrome, history of prior LV thrombus. Patient was recently discharged to home by the cardiology service on 7/23 after protracted hospital stay of greater than 30 days. (Please refer to discharge summary by cardiology for details). During that hospitalization patient's heart failure and renal function worsened. She was deemed not to be a candidate for chronic hemodialysis. Multiple conversations were held with the patient and the family regarding her poor prognosis continuous status. Patient was discharged home on dobutamine infusion for comfort measures only under the direct care of the palliative care team. Patient's family reports that since discharge palliative medicine/Hospice has been to the home. Of note family and patient informed that if she return to the ED hospice would be the only option.  Patient returned to the ER overnight with complaints of worsening shortness of breath and nausea. Chest x-ray revealed worsened interstitial fluid without consolidation or concerns for pneumonia. She has not had any fevers or chills since coming home. No worsening of her chronic lower extremity edema. No problems with her dobutamine infusion pump. Patient was given one full dose aspirin in the ER.  Upon my evaluation of the patient she was sitting upright on the side of the bed and was not in extremis. She seems somewhat drowsy but was able to talk to me without  any dyspnea. She was eating a doughnut. Reviewed outpatient palliative medication/plans with family. Discussed option of returning home and making adjustments in any comfort medications if necessary. Family and patient both report that she desires to be placed in the hospital. Patient does not appear to be actively dying at this moment. Plan is to involve cardiology to reinforce end-stage prognosis and to assist with either withdrawal of unnecessary medications vs adjustment of meds to prn status for symptom management only. I have also asked palliative care to be involved with this patient to help determine best options for home care including an emergency plan to avoid repeated visits to the ER. Uncertain if patient would be eligible for residential hospice facility given need for dobutamine infusion. It was previously documented by cardiology that if dobutamine infusion discontinued patient will have rapid decline.  ED Course:  Vital Signs: BP 118/90   Pulse (!) 54   Temp 97.9 F (36.6 C) (Temporal)   Resp 15   Ht 5\' 5"  (1.651 m)   Wt 56.7 kg (125 lb)   SpO2 100%   BMI 20.80 kg/m  2 View CXR: As above Lab data: Sodium 117, potassium 4.0, chloride 75, CO2 28, glucose 106, BUN 129, creatinine 6.6, BNP 2424, troponin 0.08, white count 4700 with neutrophils 89% and absolute neutrophils 4.2%, hemoglobin 8.5, platelets 152,000, d-dimer 1.99 Medications and treatments: Aspirin 324 mg 1  Review of Systems:  In addition to the HPI above,  No Fever-chills, myalgias or other constitutional symptoms No Headache, changes with Vision or hearing, new weakness, tingling, numbness in any extremity, dizziness, dysarthria or word finding difficulty, gait disturbance or imbalance, seizure activity; continues with issues related to right upper extremity tremor No problems swallowing  food or Liquids, indigestion/reflux, choking or coughing while eating, abdominal pain with or after eating No Chest pain, Cough,  palpitations No melena,hematochezia, dark tarry stools No dysuria, malodorous urine, hematuria or flank pain No new skin rashes, lesions, masses or bruises, No new joint pains, aches, swelling or redness No polyuria, polydypsia or polyphagia   Past Medical History:  Diagnosis Date  . AICD (automatic cardioverter/defibrillator) present    a. Biotronik placed 2009. b. ICD discharge 2011, 03/2013.  [name]. Alcohol abuse   . Anxiety   . Arterial embolism (HCC)    a. H/o RLE extremity ischemia in 2012 s/p right iliofemoral embolectomy with compartment fasciotomy.  . Asthma   . CAD (coronary artery disease)    a. 1996: anterior wall MI tx with PTCA. b. 2003: PCI/DES to circumflex marginal. c. 2004: PTCA/CBA/stenting of ostium of D1 and LAD. d. 06/2003: s/p PTCA/DES of mRCA. e. 11/2003: CBA of bifurcation diagonal LAD. f. 2007: PTCA/DES to RCA, CBA/stenting to OM2.  . Chronic systolic heart failure (HCC)    a.  secondary to severe ischemic cardiomyopathy with EF 20-25%;  b. s/p rimplantation of Biotronik single chamber ICD by Dr Ladona Ridgelaylor 02/2008;  c.  RHC 07/2013 showed well compensated left sided pressures with moderate PAH and low cardiac output. d. 2D Echo 03/2014: EF 20-25%, grade 2 DD, PAP 58mmHg.  . CKD (chronic kidney disease), stage II   . COPD (chronic obstructive pulmonary disease) (HCC)   . Diabetes mellitus, type 2 (HCC)   . Dizziness   . DVT (deep venous thrombosis) (HCC)    a. 06/2010.  [name]. GERD (gastroesophageal reflux disease)   . History of noncompliance with medical treatment   . HLD (hyperlipidemia)   . LV (left ventricular) mural thrombus    a. Dx on echo 03/2014.  [name]. Myocardial infarction (HCC)   . Pulmonary hypertension (HCC)   . Sleep apnea   . Tobacco abuse   . V-tach [facility](HCC)     Past Surgical History:  Procedure Laterality Date  . CARDIAC CATHETERIZATION    . CARDIOVERSION N/A 08/27/2016   Procedure: CARDIOVERSION;  Surgeon: Dolores PattyBensimhon, Daniel R, MD;  [facility];   Service: Cardiovascular;  Laterality: N/A;  . DIAGNOSTIC LAPAROSCOPY    . ICD GENERATOR CHANGEOUT N/A 07/20/2016   Procedure: ICD Generator Changeout;  Surgeon: Marinus Mawaylor, Gregg W, MD;  [facility];  Service: Cardiovascular;  Laterality: N/A;  . INSERT / REPLACE / REMOVE PACEMAKER    . IR FLUORO GUIDE CV LINE RIGHT  09/05/2016  . IR US GUIDE VASC ACCESS RIGHT  09/05/2016  . RIGHT HEART CATHETERIZATION N/A 07/23/2013   Procedure: RIGHT HEART CATH;  Surgeon: Dolores Pattyaniel R Bensimhon, MD;  [facility];  Service: Cardiovascular;  Laterality: N/A;  . TEE WITHOUT CARDIOVERSION N/A 08/27/2016   Procedure: TRANSESOPHAGEAL ECHOCARDIOGRAM (TEE);  Surgeon: Dolores PattyBensimhon, Daniel R, MD;  [facility];  Service: Cardiovascular;  Laterality: N/A;  . TUBAL LIGATION      Social History   Social History  . Marital status: Divorced    Spouse name: N/A  . Number of children: N/A  . Years of education: N/A   Occupational History  . disabled Disability   Social History Main Topics  . Smoking status: Current Some Day Smoker    Packs/day: 0.25    Years: 21.00    Types: Cigars  . Smokeless tobacco: Never Used     Comment: Smokes 2-3 cigars daily, quit smoking cigarettes 3 yrs ago.   .[name]  Alcohol use No  . Drug use: No  . Sexual activity: No   Other Topics Concern  . Not on file   Social History Narrative  . No narrative on file    Mobility: n/a Work history: n/a   Allergies  Allergen Reactions  . Nyquil Multi-Symptom [Pseudoeph-Doxylamine-Dm-Apap] Other (See Comments)    Fatigue, chest pressure and pain Pt tolerates Coricidin cough syrup at home  . Sudafed [Pseudoephedrine] Other (See Comments)    Fatigue, chest pressure and pain  . Lasix [Furosemide]     Rash  . Torsemide     Rash. Has tolerated admission 7/18  . Zocor [Simvastatin]     Myalgias   . Latex Rash  . Ramipril Cough    Family History  Problem Relation Age of Onset  . Coronary artery disease  Unknown        FMILY HISTORY     Prior to Admission medications   Medication Sig Start Date End Date Taking? Authorizing Provider  acetaminophen (TYLENOL) 500 MG tablet Take 1,000 mg by mouth every 6 (six) hours as needed (pain).     [provider]  albuterol (PROVENTIL) (2.5 MG/3ML) 0.083% nebulizer solution Take 2.5 mg by nebulization every 6 (six) hours as needed for wheezing or shortness of breath.    [provider]  amiodarone (PACERONE) 200 MG tablet Take 1 tablet (200 mg total) by mouth daily. 10/02/16   Graciella Freer, PA-C  apixaban (ELIQUIS) 2.5 MG TABS tablet Take 1 tablet (2.5 mg total) by mouth 2 (two) times daily. 10/01/16   Graciella Freer, PA-C  DOBUTamine (DOBUTREX) 4-5 MG/ML-% infusion Inject 610 mcg/min into the vein continuous. 10/01/16   Graciella Freer, PA-C  docusate sodium (COLACE) 100 MG capsule Take 1 capsule (100 mg total) by mouth daily as needed for mild constipation. 10/01/16   Graciella Freer, PA-C  feeding supplement (BOOST / RESOURCE BREEZE) LIQD Take 1 Container by mouth 3 (three) times daily between meals. 10/01/16   Graciella Freer, PA-C  hydrOXYzine (ATARAX/VISTARIL) 25 MG tablet Take 1 tablet (25 mg total) by mouth every 4 (four) hours as needed for itching. 10/01/16   Graciella Freer, PA-C  insulin glargine (LANTUS) 100 UNIT/ML injection Inject 5 Units into the skin daily as needed (if blood sugar >200).    [provider]  LORazepam (ATIVAN) 0.5 MG tablet Place 0.5 tablets (0.25 mg total) under the tongue every 6 (six) hours as needed for anxiety (tremors). 10/01/16   Graciella Freer, PA-C  metoCLOPramide (REGLAN) 5 MG tablet Take 1 tablet (5 mg total) by mouth 4 (four) times daily -  before meals and at bedtime. 10/01/16   Graciella Freer, PA-C  oxyCODONE (ROXICODONE) 5 MG/5ML solution Take 5 mLs (5 mg total) by mouth every hour as needed (shortness of breath, anxiety).  10/01/16   Graciella Freer, PA-C  polyethylene glycol (MIRALAX / Ethelene Hal) packet Take 17 g by mouth daily. 10/02/16   Graciella Freer, PA-C  pravastatin (PRAVACHOL) 20 MG tablet Take 1 tablet (20 mg total) by mouth daily at 6 PM. 10/01/16   Tillery, Mariam Dollar, PA-C  senna (SENOKOT) 8.6 MG TABS tablet Take 2 tablets (17.2 mg total) by mouth 2 (two) times daily. 10/01/16   Graciella Freer, PA-C  torsemide (DEMADEX) 20 MG tablet Take 4 tablets (80 mg total) by mouth 2 (two) times daily. 10/01/16   Graciella Freer, PA-C    Physical Exam: Vitals:  10/03/16 0600 10/03/16 0615 10/03/16 0630 10/03/16 0645  BP: 110/62 111/65 112/66 118/90  Pulse: (!) 51 (!) 50 (!) 50 (!) 54  Resp: 11 12 13 15   Temp:      TempSrc:      SpO2: 96% 98% 100% 100%  Weight:      Height:          Constitutional: NAD when seated upright, calm, comfortable somewhat sleepy Eyes: PERRL, lids and conjunctivae normal ENMT: Mucous membranes are dry. Posterior pharynx clear of any exudate or lesions.Normal dentition.  Neck: normal, supple, no masses, no thyromegaly Respiratory: Coarse to auscultation with diffuse bilateral crackles, diminished in bases. Normal respiratory effort without accessory muscle use at rest and when seated upright. 2 L oxygen Cardiovascular: Regular bradycardic rate and rhythm, no murmurs / rubs / gallops. Marked 3-4+ bilateral lower extremity edema. 2+ pedal pulses. No carotid bruits. Home dobutamine infusion in place Abdomen: no tenderness, no masses palpated. No hepatosplenomegaly. Bowel sounds positive /hypoactive. Distended and slightly tight. Musculoskeletal: no clubbing / cyanosis. No joint deformity upper and lower extremities. Good ROM, no contractures. Normal muscle tone.  Skin: no rashes, lesions, ulcers. No induration Neurologic: CN 2-12 grossly intact. Sensation intact, DTR normal. Strength 4/5 x all 4 extremities. Persistent right upper extremity  tremor Psychiatric: Normal judgment and insight. Awake and oriented x 3. Normal mood.    Labs on Admission: I have personally reviewed following labs and imaging studies  CBC:  Recent Labs Lab 09/28/16 0335 09/29/16 0442 09/30/16 0320 10/01/16 0440 10/03/16 0436  WBC 4.6 4.8 5.1 5.1 4.7  NEUTROABS  --   --   --   --  4.2  HGB 8.6* 8.4* 8.5* 8.4* 8.5*  HCT 26.3* 25.4* 24.5* 24.8* 25.2*  MCV 81.9 81.7 79.8 78.7 78.8  PLT 134* 135* 138* 153 152   Basic Metabolic Panel:  Recent Labs Lab 09/28/16 0335 09/29/16 0442 09/30/16 0320 10/01/16 0440 10/03/16 0436  NA 124* 123* 119* 119* 117*  K 3.7 3.7 3.8 4.1 4.0  CL 79* 79* 76* 76* 75*  CO2 31 33* 30 29 28   GLUCOSE 146* 173* 133* 139* 106*  BUN 105* 113* 115* 117* 129*  CREATININE 5.20* 5.39* 5.39* 5.53* 6.06*  CALCIUM 9.8 9.6 9.3 8.9 9.2   GFR: Estimated Creatinine Clearance: 9.1 mL/min (A) (by C-G formula based on SCr of 6.06 mg/dL (H)). Liver Function Tests: No results for input(s): AST, ALT, ALKPHOS, BILITOT, PROT, ALBUMIN in the last 168 hours. No results for input(s): LIPASE, AMYLASE in the last 168 hours. No results for input(s): AMMONIA in the last 168 hours. Coagulation Profile: No results for input(s): INR, PROTIME in the last 168 hours. Cardiac Enzymes:  Recent Labs Lab 10/03/16 0436  TROPONINI 0.08*   BNP (last 3 results) No results for input(s): PROBNP in the last 8760 hours. HbA1C: No results for input(s): HGBA1C in the last 72 hours. CBG:  Recent Labs Lab 09/30/16 1659 09/30/16 2122 10/01/16 0724 10/01/16 1137 10/01/16 1251  GLUCAP 115* 161* 211* 67 89   Lipid Profile: No results for input(s): CHOL, HDL, LDLCALC, TRIG, CHOLHDL, LDLDIRECT in the last 72 hours. Thyroid Function Tests: No results for input(s): TSH, T4TOTAL, FREET4, T3FREE, THYROIDAB in the last 72 hours. Anemia Panel: No results for input(s): VITAMINB12, FOLATE, FERRITIN, TIBC, IRON, RETICCTPCT in the last 72 hours. Urine  analysis:    Component Value Date/Time   COLORURINE YELLOW 09/10/2016 1508   APPEARANCEUR HAZY (A) 09/10/2016 1508   LABSPEC 1.011  09/10/2016 1508   PHURINE 5.0 09/10/2016 1508   GLUCOSEU NEGATIVE 09/10/2016 1508   HGBUR MODERATE (A) 09/10/2016 1508   BILIRUBINUR NEGATIVE 09/10/2016 1508   KETONESUR NEGATIVE 09/10/2016 1508   PROTEINUR NEGATIVE 09/10/2016 1508   UROBILINOGEN 4.0 (H) 06/25/2011 1224   NITRITE NEGATIVE 09/10/2016 1508   LEUKOCYTESUR MODERATE (A) 09/10/2016 1508   Sepsis Labs: @LABRCNTIP (procalcitonin:4,lacticidven:4) )No results found for this or any previous visit (from the past 240 hour(s)).   Radiological Exams on Admission: Dg Chest 2 View  Result Date: 10/03/2016 CLINICAL DATA:  Dyspnea tonight EXAM: CHEST  2 VIEW COMPARISON:  09/10/2016 FINDINGS: Moderate cardiomegaly, unchanged. A grossly intact transvenous cardiac leads. Right upper extremity PICC line appears satisfactorily positioned. No confluent airspace consolidation. No effusion. No pneumothorax. Normal pulmonary vasculature. Moderate interstitial fluid or thickening, worsened. IMPRESSION: 1. Worsened interstitial fluid or thickening. No confluent airspace consolidation. 2. Unchanged cardiomegaly. Electronically Signed   By: Ellery Plunk M.D.   On: 10/03/2016 05:03    EKG: (Independently reviewed) Sinus rhythm with ventricular rate 54 bpm, QTC 560 ms, no acute ischemic changes, unchanged from previous, long-standing left bundle branch block  Assessment/Plan Principal Problem:   Acute on chronic respiratory failure with hypoxemia 2/2 End-stage systolic heart failure, acute on chronic and pulmonary hypertension -Patient presents with acute onset of shortness of breath that resolved independent of any specific treatments in the ER with known diagnosis of end-stage cardiopulmonary issues on hospice/palliative measures at home -We will continue home infusion of dobutamine with home Lasix for comfort    -Continue oxygen and nebulizers for comfort -NOT actively dying at time of initial evaluation noting she is quite alert and tolerating a doughnut (will allow food from home and regular diet) -Palliative medicine to assist with discharge planning noting patient now states she wants to be in the hospital-there seems to be a degree of anxiety on the patient's part and possibly on family's part regarding management of patient at home-also some inconsistency vs needs additional education regarding expectations of patient's end-of-life status and needs/care -Cardiology consulted to reinforce end-stage prognosis and no additional acute treatments warranted -Continue preadmission Ativan and morphine for symptom management  Active Problems:   Diabetes mellitus, type 2  -Was on Lantus at home prn CBG greater than 200 -Poor oral intake documented prior to arrival secondary to above condition and symptom management medications -SSI here-patient and family wish to continue to treat elevated CBGs after discharge would discontinue Lantus and sent home on regular insulin for sliding scale     CKD (chronic kidney disease) stage 5, GFR less than 15 ml/min  -Secondary to cardiorenal syndrome -Lasix and dobutamine as above for symptom management -Previously deemed NOT a dialysis candidate    H/O atrial flutter -Currently maintaining sinus rhythm with bradycardic rate -Continue amiodarone and eliquis for now - given poor prognosis consider discontinuing these medications unless using amiodarone for comfort regarding suppression of tachyarrhythmia    Chronic nausea -Multifactorial: Edematous bowel from heart failure, uremia from kidney failure, amiodarone and other medications, chronic constipation -Treat underlying causes as much as possible -Continue stool softeners and laxatives -Continue Reglan -Add Zofran and Phenergan prn - Minimize unnecessary medications    AICD (automatic  cardioverter/defibrillator) present    HLD (hyperlipidemia) -Discontinue statin      DVT prophylaxis: Eliquis Code Status: DO NOT RESUSCITATE Family Communication: 2 sons and 1 daughter at bedside Disposition Plan: To be determined-uncertain at home versus residential hospice Consults called: Cardiology/[facility] on call and Palliative Medicine/[place]  Gabriela Giannelli L. ANP-BC [facility] Pager [phone]   If 7PM-7AM, please contact night-coverage [email or web address] Password [email or web address]  10/03/2016, 8:09 AM

## 2016-10-03 NOTE — ED Provider Notes (Signed)
TIME SEEN: 4:22 AM  CHIEF COMPLAINT: Worsening shortness of breath  HPI: Patient is a very unfortunate 56 year old female who has a history of coronary artery disease, CHF with ejection fraction of 15% with a pacemaker/AICD, chronic kidney disease, COPD who presents to the emergency department with complaints of worsening shortness of breath. Patient was admitted to the cardiology service on June 13 and was just discharged on July 23. During her course patient was found to have worsening renal failure and prior to discharge worsening hyponatremia. Given her poor prognosis, she was not felt to be a candidate for transplant or dialysis. There appears to have been multiple family and patient meetings with providers where they discussed that patient's prognosis was very poor and this was a terminal diagnosis. Patient presents here today because she feels that she is getting worse. She denies any chest pain or chest discomfort. No fevers. No cough. Has bilateral foot swelling that is unchanged. Patient has a PICC line in her right upper extremity and is getting IV dobutamine. She is on torsemide at home.  ROS: See HPI Constitutional: no fever  Eyes: no drainage  ENT: no runny nose   Cardiovascular:  no chest pain  Resp:  SOB  GI: no vomiting GU: no dysuria Integumentary: no rash  Allergy: no hives  Musculoskeletal:  leg swelling  Neurological: no slurred speech ROS otherwise negative  PAST MEDICAL HISTORY/PAST SURGICAL HISTORY:  Past Medical History:  Diagnosis Date  . AICD (automatic cardioverter/defibrillator) present    a. Biotronik placed 2009. b. ICD discharge 2011, 03/2013.  [name] Alcohol abuse   . Anxiety   . Arterial embolism (HCC)    a. H/o RLE extremity ischemia in 2012 s/p right iliofemoral embolectomy with compartment fasciotomy.  . Asthma   . CAD (coronary artery disease)    a. 1996: anterior wall MI tx with PTCA. b. 2003: PCI/DES to circumflex marginal. c. 2004: PTCA/CBA/stenting  of ostium of D1 and LAD. d. 06/2003: s/p PTCA/DES of mRCA. e. 11/2003: CBA of bifurcation diagonal LAD. f. 2007: PTCA/DES to RCA, CBA/stenting to OM2.  . Chronic systolic heart failure (HCC)    a.  secondary to severe ischemic cardiomyopathy with EF 20-25%;  b. s/p rimplantation of Biotronik single chamber ICD by Dr Ladona Ridgel 02/2008;  c.  RHC 07/2013 showed well compensated left sided pressures with moderate PAH and low cardiac output. d. 2D Echo 03/2014: EF 20-25%, grade 2 DD, PAP .  . CKD (chronic kidney disease), stage II   . COPD (chronic obstructive pulmonary disease) (HCC)   . Diabetes mellitus, type 2 (HCC)   . Dizziness   . DVT (deep venous thrombosis) (HCC)    a. 06/2010.  [name] GERD (gastroesophageal reflux disease)   . History of noncompliance with medical treatment   . HLD (hyperlipidemia)   . LV (left ventricular) mural thrombus    a. Dx on echo 03/2014.  [name] Myocardial infarction (HCC)   . Pulmonary hypertension (HCC)   . Sleep apnea   . Tobacco abuse   . V-tach [facility])     MEDICATIONS:  Prior to Admission medications   Medication Sig Start Date End Date Taking? Authorizing Provider  acetaminophen (TYLENOL) 500 MG tablet Take 1,000 mg by mouth every 6 (six) hours as needed (pain).     [provider]  albuterol (PROVENTIL) (2.5 MG/3ML) 0.083% nebulizer solution Take 2.5 mg by nebulization every 6 (six) hours as needed for wheezing or shortness of breath.    [provider]  amiodarone (PACERONE) 200 MG tablet Take 1 tablet (200 mg total) by mouth daily. 10/02/16   Graciella Freerillery, Michael Andrew, PA-C  apixaban (ELIQUIS) 2.5 MG TABS tablet Take 1 tablet (2.5 mg total) by mouth 2 (two) times daily. 10/01/16   Graciella Freerillery, Michael Andrew, PA-C  DOBUTamine (DOBUTREX) 4-5 MG/ML-% infusion Inject 610 mcg/min into the vein continuous. 10/01/16   Graciella Freerillery, Michael Andrew, PA-C  docusate sodium (COLACE) 100 MG capsule Take 1 capsule (100 mg total) by mouth daily as needed for mild  constipation. 10/01/16   Graciella Freerillery, Michael Andrew, PA-C  feeding supplement (BOOST / RESOURCE BREEZE) LIQD Take 1 Container by mouth 3 (three) times daily between meals. 10/01/16   Graciella Freerillery, Michael Andrew, PA-C  hydrOXYzine (ATARAX/VISTARIL) 25 MG tablet Take 1 tablet (25 mg total) by mouth every 4 (four) hours as needed for itching. 10/01/16   Graciella Freerillery, Michael Andrew, PA-C  insulin glargine (LANTUS) 100 UNIT/ML injection Inject 5 Units into the skin daily as needed (if blood sugar >200).    [provider]  LORazepam (ATIVAN) 0.5 MG tablet Place 0.5 tablets (0.25 mg total) under the tongue every 6 (six) hours as needed for anxiety (tremors). 10/01/16   Graciella Freerillery, Michael Andrew, PA-C  metoCLOPramide (REGLAN) 5 MG tablet Take 1 tablet (5 mg total) by mouth 4 (four) times daily -  before meals and at bedtime. 10/01/16   Graciella Freerillery, Michael Andrew, PA-C  oxyCODONE (ROXICODONE) 5 MG/5ML solution Take 5 mLs (5 mg total) by mouth every hour as needed (shortness of breath, anxiety). 10/01/16   Graciella Freerillery, Michael Andrew, PA-C  polyethylene glycol (MIRALAX / Ethelene HalGLYCOLAX) packet Take 17 g by mouth daily. 10/02/16   Graciella Freerillery, Michael Andrew, PA-C  pravastatin (PRAVACHOL) 20 MG tablet Take 1 tablet (20 mg total) by mouth daily at 6 PM. 10/01/16   Tillery, Mariam DollarMichael Andrew, PA-C  senna (SENOKOT) 8.6 MG TABS tablet Take 2 tablets (17.2 mg total) by mouth 2 (two) times daily. 10/01/16   Graciella Freerillery, Michael Andrew, PA-C  torsemide (DEMADEX) 20 MG tablet Take 4 tablets (80 mg total) by mouth 2 (two) times daily. 10/01/16   Graciella Freerillery, Michael Andrew, PA-C    ALLERGIES:  Allergies  Allergen Reactions  . Nyquil Multi-Symptom [Pseudoeph-Doxylamine-Dm-Apap] Other (See Comments)    Fatigue, chest pressure and pain Pt tolerates Coricidin cough syrup at home  . Sudafed [Pseudoephedrine] Other (See Comments)    Fatigue, chest pressure and pain  . Lasix [Furosemide]     Rash  . Torsemide     Rash. Has tolerated admission 7/18  . Zocor  [Simvastatin]     Myalgias   . Latex Rash  . Ramipril Cough    SOCIAL HISTORY:  Social History  Substance Use Topics  . Smoking status: Current Some Day Smoker    Packs/day: 0.25    Years: 21.00    Types: Cigars  . Smokeless tobacco: Never Used     Comment: Smokes 2-3 cigars daily, quit smoking cigarettes 3 yrs ago.   . Alcohol use No    FAMILY HISTORY: Family History  Problem Relation Age of Onset  . Coronary artery disease Unknown        FMILY HISTORY    EXAM: BP 114/71 (BP Location: Left Arm)   Pulse (!) 54   Temp 97.9 F (36.6 C) (Temporal)   Resp 14   Ht 5\' 5"  (1.651 m)   Wt 56.7 kg (125 lb)   SpO2 92%   BMI 20.80 kg/m  CONSTITUTIONAL: Alert and oriented and responds appropriately  to questions. Chronically ill-appearing, cachectic, afebrile HEAD: Normocephalic EYES: Conjunctivae clear, pupils appear equal, EOMI ENT: normal nose; moist mucous membranes NECK: Supple, no meningismus, no nuchal rigidity, no LAD  CARD: RRR; S1 and S2 appreciated; no murmurs, no clicks, no rubs, no gallops RESP: Normal chest excursion without splinting or tachypnea; breath sounds clear and equal bilaterally; no wheezes, no rhonchi, no rales, no hypoxia or respiratory distress, speaking full sentences ABD/GI: Normal bowel sounds; non-distended; soft, non-tender, no rebound, no guarding, no peritoneal signs, no hepatosplenomegaly BACK:  The back appears normal and is non-tender to palpation, there is no CVA tenderness EXT: Normal ROM in all joints; non-tender to palpation; nonpitting edema noted to both dorsal feet; normal capillary refill; no cyanosis, no calf tenderness or swelling    SKIN: Normal color for age and race; warm; no rash NEURO: Moves all extremities equally PSYCH: The patient's mood and manner are appropriate. Grooming and personal hygiene are appropriate.  MEDICAL DECISION MAKING: Patient here with worsening shortness of breath. Concern for CHF exacerbation that is  worsening versus pulmonary embolus. Also at risk for pneumonia. She is an extremely poor historian and it seems that patient and family at this time do not seem to understand how poor the patient's prognosis is. Will continue with workup but anticipate that patient will need palliative care.  ED PROGRESS: Patient's workup shows she has continued worsening renal failure, worsening hyponatremia and worsening heart failure. She has pulmonary edema seen on her chest x-ray. I have discussed this at length with patient and her family at bedside. They now seem to understand that there is nothing else that we can do to help the patient get better. Patient states at this time that all she wants is for [place] to make her comfortable. She confirms that she is a DO NOT RESUSCITATE/DO NOT INTUBATE and would not want further medical workup or treatment and I feel this is appropriate. Her family is at bedside during this conversation and they agree. She states she would rather be admitted to the hospital then go home and die. She states she wants to be admitted today "hospice floor". She denies any pain or anxiety at this time. We'll discuss with hospitalist for admission for comfort care.   6:20 AM Discussed patient's case with hospitalist, Dr. Toniann Fail.  I have recommended admission and patient (and family if present) agree with this plan. Admitting physician will place admission orders.   I reviewed all nursing notes, vitals, pertinent previous records, EKGs, lab and urine results, imaging (as available).     EKG Interpretation  Date/Time:  Wednesday October 03 2016 04:17:30 EDT Ventricular Rate:  54 PR Interval:    QRS Duration: 131 QT Interval:  544 QTC Calculation: 516 R Axis:   -52 Text Interpretation:  Sinus rhythm Prolonged PR interval Left atrial enlargement Left bundle branch block Confirmed by Zebadiah Willert, Baxter Hire [phone]) on 10/03/2016 4:37:53 AM        CRITICAL CARE Performed by: Raelyn Number   Total critical care time: 45 minutes  Critical care time was exclusive of separately billable procedures and treating other patients.  Critical care was necessary to treat or prevent imminent or life-threatening deterioration.  Critical care was time spent personally by me on the following activities: development of treatment plan with patient and/or surrogate as well as nursing, discussions with consultants, evaluation of patient's response to treatment, examination of patient, obtaining history from patient or surrogate, ordering and performing treatments and interventions, ordering and  review of laboratory studies, ordering and review of radiographic studies, pulse oximetry and re-evaluation of patient's condition.    Binta Statzer, Layla MawKristen N, DO 10/03/16 [phone]

## 2016-10-03 NOTE — ED Notes (Signed)
Attempted report 

## 2016-10-03 NOTE — ED Notes (Signed)
Report called  

## 2016-10-03 NOTE — ED Triage Notes (Signed)
Patient arrived with EMS from home reports worsening SOB with mild nausea this evening , abdominal distention and generalized weakness/poor appetite this week . She is receiving continues Dobutamine IV through a PICC line . Denies chest pain / no fever or chills .

## 2016-10-04 DIAGNOSIS — Z794 Long term (current) use of insulin: Secondary | ICD-10-CM

## 2016-10-04 DIAGNOSIS — J9621 Acute and chronic respiratory failure with hypoxia: Secondary | ICD-10-CM

## 2016-10-04 DIAGNOSIS — E1122 Type 2 diabetes mellitus with diabetic chronic kidney disease: Secondary | ICD-10-CM

## 2016-10-04 MED ORDER — SALINE SPRAY 0.65 % NA SOLN
2.0000 | NASAL | Status: DC | PRN
  Filled 2016-10-04: qty 44

## 2016-10-04 NOTE — Progress Notes (Signed)
Palliative Medicine RN Note: Pt is awake, relaxed, sitting in recliner. Upon my entering the room, she said, "Jessica [name], I'm not ready for you yet." Clarified that she is not yet ready to stop gtt. Family surrounds her, and she denies SOB, pain, anxiety. She reports that she had a lot of "stoppage" in her nose; continues to get O2 via [place]. Obtained rx for saline nasal spray liberally. Plan f/u tomorrow am by PMT unless we are called earlier.  Margret ChanceMelanie G. Kinzy Weyers, RN, BSN, [facility] 10/04/2016 2:48 PM Cell [phone] 8:00-4:00 Monday-Friday Office [phone]

## 2016-10-04 NOTE — Progress Notes (Signed)
[facility] PROGRESS NOTE  Jennye MoccasinSherita Markson ZHY:[id number] DOB: [birth year] DOA: 10/03/2016  PCP: Patient, No Pcp Per  Brief History/Interval Summary: 56 year old African-American female with a past medical history of atrial flutter, pulmonary hypertension, coronary artery disease, diabetes, end-stage biventricular congestive heart failure with recent cardiogenic shock, chronic kidney disease secondary to cardiorenal syndrome, history of prior LV thrombus recently discharged home by cardiology on 7/23 after a protracted hospital stay. She was discharged home on dobutamine infusion for comfort measures only and was under the care of palliative medicine. Patient presented to the ED with complaints of worsening shortness of breath and nausea. Chest x-ray revealed worsening interstitial edema. After discussions with family, patient was admitted mainly for comfort care.  Reason for Visit: End-stage biventricular congestive heart failure  Consultants: Palliative medicine. Cardiology.  Procedures: None  Antibiotics: None  Subjective/Interval History: Patient sitting up in the chair eating her breakfast this morning. Her boyfriend is at the bedside. Patient denies any chest pain. States that she is breathing okay.  ROS: Denies any nausea or vomiting  Objective:  Vital Signs  Vitals:   10/03/16 1045 10/03/16 1119 10/03/16 1138 10/04/16 0558  BP: 109/72 108/61 115/63 101/60  Pulse: (!) 54  (!) 51 60  Resp: 15 14  19   Temp:  98 F (36.7 C) (!) 96.3 F (35.7 C) 98.1 F (36.7 C)  TempSrc:  Oral Axillary Oral  SpO2: 97% 98% 98% 98%  Weight:      Height:        Intake/Output Summary (Last 24 hours) at 10/04/16 1136 Last data filed at 10/04/16 [id number]  Gross per 24 hour  Intake           577.24 ml  Output              300 ml  Net           277.24 ml   Filed Weights   10/03/16 0420  Weight: 56.7 kg (125 lb)    General appearance: alert, appears stated age, distracted and no  distress Resp: Crackles noted bilateral lower lungs. No wheezing Cardio: regular rate and rhythm, S1, S2 normal, no murmur, click, rub or gallop GI: soft, non-tender; bowel sounds normal; no masses,  no organomegaly Extremities: edema Mild Neurologic: Seems to be somewhat distracted. However, no focal neurological deficits noted.  Lab Results:  Data Reviewed: I have personally reviewed following labs and imaging studies  CBC:  Recent Labs Lab 09/28/16 0335 09/29/16 0442 09/30/16 0320 10/01/16 0440 10/03/16 0436  WBC 4.6 4.8 5.1 5.1 4.7  NEUTROABS  --   --   --   --  4.2  HGB 8.6* 8.4* 8.5* 8.4* 8.5*  HCT 26.3* 25.4* 24.5* 24.8* 25.2*  MCV 81.9 81.7 79.8 78.7 78.8  PLT 134* 135* 138* 153 152    Basic Metabolic Panel:  Recent Labs Lab 09/28/16 0335 09/29/16 0442 09/30/16 0320 10/01/16 0440 10/03/16 0436  NA 124* 123* 119* 119* 117*  K 3.7 3.7 3.8 4.1 4.0  CL 79* 79* 76* 76* 75*  CO2 31 33* 30 29 28   GLUCOSE 146* 173* 133* 139* 106*  BUN 105* 113* 115* 117* 129*  CREATININE 5.20* 5.39* 5.39* 5.53* 6.06*  CALCIUM 9.8 9.6 9.3 8.9 9.2    GFR: Estimated Creatinine Clearance: 9.1 mL/min (A) (by C-G formula based on SCr of 6.06 mg/dL (H)).   Cardiac Enzymes:  Recent Labs Lab 10/03/16 0436  TROPONINI 0.08*    CBG:  Recent Labs Lab 10/01/16  [id number] 10/01/16 1137 10/01/16 1251 10/03/16 0846 10/03/16 1304  GLUCAP 211* 67 89 159* 131*    Radiology Studies: Dg Chest 2 View  Result Date: 10/03/2016 CLINICAL DATA:  Dyspnea tonight EXAM: CHEST  2 VIEW COMPARISON:  09/10/2016 FINDINGS: Moderate cardiomegaly, unchanged. A grossly intact transvenous cardiac leads. Right upper extremity PICC line appears satisfactorily positioned. No confluent airspace consolidation. No effusion. No pneumothorax. Normal pulmonary vasculature. Moderate interstitial fluid or thickening, worsened. IMPRESSION: 1. Worsened interstitial fluid or thickening. No confluent airspace  consolidation. 2. Unchanged cardiomegaly. Electronically Signed   By: Ellery Plunkaniel R Mitchell M.D.   On: 10/03/2016 05:03     Medications:  Scheduled: . feeding supplement  1 Container Oral TID BM   Continuous: . sodium chloride 10 mL/hr at 10/03/16 1358  . DOBUTamine 10 mcg/kg/min (10/03/16 1628)   JXB:[id number]:acetaminophen **OR** acetaminophen, albuterol, heparin lock flush, hydrOXYzine, LORazepam, morphine injection, ondansetron (ZOFRAN) IV **OR** promethazine  Assessment/Plan:  Principal Problem:   End-stage systolic heart failure, acute on chronic (HCC) Active Problems:   Pulmonary hypertension (HCC)   Acute on chronic respiratory failure with hypoxia (HCC)   Diabetes mellitus, type 2 (HCC)   CKD (chronic kidney disease) stage 5, GFR less than 15 ml/min (HCC)   AICD (automatic cardioverter/defibrillator) present   HLD (hyperlipidemia)   H/O atrial flutter   Chronic nausea    Acute and chronic respiratory failure with hypoxemia secondary to end-stage systolic CHF. Patient's symptoms actually resolved before any specific treatments were provided. Patient was recently discharged home on a dobutamine infusion under palliative medicine care. Patient and family understands that there is no definitive treatment that can be offered anymore. Patient has reached terminal disease status. She actually appears to have stabilized some. Plan is for dobutamine to be discontinued today once the rest of the family arrives. And at that time, her AICD will also be turned off. Palliative medicine was following and assisting with management. If patient declines quickly, then comfort care measures will be initiated in the hospital. If she remains stable, then she may be a candidate for residential hospice.  Diabetes mellitus type 2. SSI. Monitor CBGs. No Lantus per family wishes.  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 Lasix and dobutamine mainly for symptom management. Previously deemed not a  dialysis candidate.  History of atrial flutter. Currently in sinus rhythm. Amiodarone and anticoagulation has been discontinued.  Goals of care Patient is DNR/DNI. Palliative medicine following. Plan is for dobutamine infusion to be discontinued sometime today. AICD to be turned off today as well. And then depending on patient's clinical status patient will either stay in the hospital or be considered for residential hospice.  DVT Prophylaxis: Comfort Care    Code Status: DO NOT RESUSCITATE  Family Communication: Discussed with the patient and her boyfriend  Disposition Plan: Management as outlined above.    LOS: 1 day   [facility] Regional Med CtrKRISHNAN,Prezley Qadir  [facility] Pager [phone] 10/04/2016, 11:36 AM  If 7PM-7AM, please contact night-coverage at [email or web address], password [email or web address]

## 2016-10-04 NOTE — Progress Notes (Signed)
Palliative Medicine RN Note: Pt is sitting in recliner eating ice. She is still on dobutamine. Discussed her feelings about the drip and her desire to turn it off today; she is waiting for more family to arrive. She denies pain, nausea, SOB. [place] in place. Spoke with Dr Rito EhrlichKrishnan and charge RN. Plan for d/c dobutamine once family has arrived. If pt declines very quickly, then we will continue aggressive comfort care. If after d/c the drip she appears to have a prognosis of days, then inpt hospice may be a good fit for her.   PMT will follow up this afternoon and prn.  Margret ChanceMelanie G. Veyda Kaufman, RN, BSN, [facility] 10/04/2016 9:24 AM Cell [phone] 8:00-4:00 Monday-Friday Office [phone]

## 2016-10-05 MED ORDER — HYDROMORPHONE HCL 2 MG PO TABS
1.0000 mg | ORAL_TABLET | Freq: Three times a day (TID) | ORAL | Status: DC
  Administered 2016-10-05 – 2016-10-06 (×3): 1 mg via ORAL
  Filled 2016-10-05 (×4): qty 1

## 2016-10-05 MED ORDER — SENNA 8.6 MG PO TABS
2.0000 | ORAL_TABLET | Freq: Every day | ORAL | Status: DC
  Administered 2016-10-05: 17.2 mg via ORAL
  Filled 2016-10-05: qty 2

## 2016-10-05 MED ORDER — SODIUM CHLORIDE 0.9% FLUSH
10.0000 mL | INTRAVENOUS | Status: DC | PRN
  Administered 2016-10-05 – 2016-10-06 (×2): 10 mL
  Filled 2016-10-05 (×2): qty 40

## 2016-10-05 MED ORDER — LORAZEPAM 0.5 MG PO TABS
0.5000 mg | ORAL_TABLET | Freq: Two times a day (BID) | ORAL | Status: DC
  Administered 2016-10-05 (×2): 0.5 mg via ORAL
  Administered 2016-10-06: 0.125 mg via ORAL
  Filled 2016-10-05 (×3): qty 1

## 2016-10-05 MED ORDER — HYDROMORPHONE HCL 1 MG/ML IJ SOLN: 0.2500 mg | INTRAMUSCULAR | Status: DC | PRN

## 2016-10-05 NOTE — Progress Notes (Signed)
I met with Ms. Bal this AM- since being in the hospital she feels better-mostly a result of better symptom management. She clearly needs hospice support- we have exhausted medical options for managing her end stage heart disease. Barriers to hospice have largely been the dobutamine-but at this stage in her disease, I doubt it is providing significant relief. I have discussed her case with [facility] Palliative and Hospice Director to see what hospice options are available.Plan below:  1. TRIAL of Stopping/Wean Dobutamine with concomitant increase in symptom management medications. Would avoid placing this decision completely on the patient and family- patient herself isn't convinced it made a big difference in her symptoms. 2. Needs AICD turned off- they will need reassurance this will not cause death.   [place] would be best option for continued symptom management- ideally we should stop dobutamine before this transfer but if there is a significant change in her symptoms it could possibly be weaned with more symptom management expertise in the inpatient hospice setting. 4. She has anxiety about going home and getting into distress -her daughter is concerned about her dying or getting into distress at home and there are young children there. 5. She could even go back home from inpatient hospice with better symptom control- I think that is the primary issue impactingh er readmission and return to ED.  Will start Ativan PO 0.5 BID. PRN ativan for air hunger and anxiety IV  Given renal function will use low dose oral hydromorphone scheduled. IV hydromorphione PRN for worsening dyspnea. She will need frequent monitoring. Laxative with opioid. Monitor IO.  At this point our best medical intervention is expert symptom management and comfort. She is with or without dobutamine at high risk for sudden death.  Discussed with HF team who will also speak with patient and support this plan.  Lane Hacker, DO Palliative Medicine [phone]

## 2016-10-05 NOTE — Progress Notes (Signed)
  Asked to see again this afternoon by Dr. Rito EhrlichKrishnan and Dr Phillips OdorGolding re: ICD and dobutamine.   Family and pt both agree ICD deactivation appropriate. Biotronik rep alerted and will come this evening.   Family and pt have firm ideas about what dobutamine is doing for her, and are hesitant to stop as they believe it is what is keeping her alive. They were assured that this is not the case.  We discussed how dobutamine is used to increase heart function and organ perfusion, and to help manage symptoms in end-stage Heart Failure.     Patient has had worsening heart function demonstrated by persistently low mixed venous saturations ([place]), worsening renal function, and worsening symptoms; All despite the use of dobutamine. Family recognizes this.  Discussed with all care teams (Palliative, IM, Nursing, HH, and HF team) and all believe that dobutamine is not contributing meaningfully to her clinical course, and may be limiting it in restricting her from Full Hospice care at home, which is patients ultimate goal.   Pt and family agree to stopping Dobutamine at 1800 this evening, once family has arrived. Confirmed with providers and nursing. Family understand that decompensation after stopping dobutamine should not be entirely contributed to dobutamine, and that the recommendation is NOT to restart. Pt has trouble with this, and would like to have the option to resume dobutamine if she asks for it.    Pt expresses concerns about passing away at home where her grandchildren will find her. [facility] and pt and family seem interested, but not ready to commit.   Above discussed with Dr. Shirlee LatchMcLean. Recommend aggressive symptom management, and we would NOT recommend resumption of dobutamine.   OK for home hospice if appropriate, but would encourage [facility] to [facility]. Would not recommend further admissions as we have no further recommendations or therapies available to  Miss Everardo AllEllison.  Appreciated IM and Palliative assistance and care of this patient.    Casimiro NeedleMichael [address]"Andy" [place], PA-C 10/05/2016 3:03 PM

## 2016-10-05 NOTE — Progress Notes (Signed)
Reported to on-coming tech to recheck Pt regarding bath.

## 2016-10-05 NOTE — Progress Notes (Signed)
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 She was discharged home on dobutamine infusion for comfort measures only and was under the care of palliative medicine. Patient presented to the ED with complaints of worsening shortness of breath and nausea. Chest x-ray revealed worsening interstitial edema. After discussions with family, patient was admitted mainly for comfort care.  Reason for Visit: End-stage biventricular congestive heart failure  Consultants: Palliative medicine. Cardiology.  Procedures: None  Antibiotics: None  Subjective/Interval History: Patient sitting up in the chair. States that she is feeling better. Denies any chest pain or shortness of breath.  ROS: Denies any nausea, vomiting  Objective:  Vital Signs  Vitals:   10/04/16 0558 10/04/16 1439 10/04/16 2216 10/05/16 0547  BP: 101/60 104/67 (!) 101/54 (!) 108/54  Pulse: 60 (!) 114 68 (!) 56  Resp: 19 18 18 18   Temp: 98.1 F (36.7 C) 97.6 F (36.4 C) 98.4 F (36.9 C) 98.1 F (36.7 C)  TempSrc: Oral Oral Oral Oral  SpO2: 98% 100% 97% 100%  Weight:      Height:        Intake/Output Summary (Last 24 hours) at 10/05/16 0913 Last data filed at 10/05/16 0641  Gross per 24 hour  Intake           821.52 ml  Output                0 ml  Net           821.52 ml   Filed Weights   10/03/16 0420  Weight: 56.7 kg (125 lb)    General appearance: Awake, alert. No distress Resp: Crackles noted bilateral lung fields.  No change from yesterday. No wheezing. Cardio: S1, S2 is normal, regular. No S3, S4. No rubs, murmurs, or bruit GI: Abdomen is soft. Nontender. Nondistended. Sounds are present. No masses or organomegaly Neurologic: Seems to be somewhat distracted. However, no focal neurological deficits noted.  Lab Results:  Data Reviewed: I have personally reviewed following labs and imaging studies  CBC:  Recent Labs Lab 09/29/16 0442 09/30/16 0320 10/01/16 0440 10/03/16 0436  WBC 4.8 5.1 5.1 4.7  NEUTROABS  --   --   --  4.2  HGB 8.4* 8.5* 8.4* 8.5*  HCT 25.4* 24.5* 24.8* 25.2*  MCV 81.7 79.8 78.7 78.8  PLT 135* 138* 153 152    Basic Metabolic Panel:  Recent Labs Lab 09/29/16 0442 09/30/16 0320 10/01/16 0440 10/03/16 0436  NA 123* 119* 119* 117*  K 3.7 3.8 4.1 4.0  CL 79* 76* 76* 75*  CO2 33* 30 29 28   GLUCOSE 173* 133* 139* 106*  BUN 113* 115* 117* 129*  CREATININE 5.39* 5.39* 5.53* 6.06*  CALCIUM 9.6 9.3 8.9 9.2    GFR: Estimated Creatinine Clearance: 9.1 mL/min (A) (by C-G formula based on SCr of 6.06 mg/dL (H)).   Cardiac Enzymes:  Recent Labs Lab 10/03/16 0436  TROPONINI 0.08*    CBG:  Recent Labs Lab 10/01/16 0724 10/01/16 1137 10/01/16 1251 10/03/16 0846 10/03/16 1304  GLUCAP 211* 67 89 159* 131*    Radiology Studies: No results found.  Medications:  Scheduled: . feeding supplement  1 Container Oral TID BM   Continuous: . sodium chloride 10 mL/hr at 10/03/16 1358  . DOBUTamine 10 mcg/kg/min (10/04/16 1806)   ZHY:[id number]:acetaminophen **OR** acetaminophen, albuterol, heparin lock flush, hydrOXYzine, LORazepam, morphine injection, ondansetron (ZOFRAN) IV **OR** promethazine, sodium chloride, sodium chloride flush  Assessment/Plan:  Principal Problem:   End-stage systolic heart failure, acute on chronic (HCC) Active Problems:   Pulmonary hypertension (HCC)   Acute on chronic respiratory failure with hypoxia (HCC)   Diabetes mellitus, type 2  (HCC)   CKD (chronic kidney disease) stage 5, GFR less than 15 ml/min (HCC)   AICD (automatic cardioverter/defibrillator) present   HLD (hyperlipidemia)   H/O atrial flutter   Chronic nausea    Acute and chronic respiratory failure with hypoxemia secondary to end-stage systolic CHF. Patient's symptoms actually resolved before any specific treatments were provided. Patient was recently discharged home on a dobutamine infusion under palliative medicine care. Patient and family understands that there is no definitive treatment that can be offered anymore. Patient stabilized during her stay in the hospital. Initially plan was to stop her dobutamine infusion yesterday. However, patient and family declined discontinuation yesterday. Palliative medicine to have further discussion with patient and family today. Discussed with Dr. Phillips OdorGolding. Plan is to try weaning down on her dobutamine to see if she gets symptomatic. She is also trying to coordinate with outpatient hospice providers. We will wait for final resolution of this matter.  Diabetes mellitus type 2. SSI. Monitor CBGs. No Lantus per family wishes. CBGs are stable.  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 Lasix and dobutamine mainly for symptom management. Previously deemed not a dialysis candidate.  History of atrial flutter. Currently in sinus rhythm. Amiodarone and anticoagulation has been discontinued.  Goals of care Patient is DNR/DNI. Palliative medicine following and to assist with disposition.  DVT Prophylaxis: Comfort Care    Code Status: DO NOT RESUSCITATE  Family Communication: Discussed with the patient and her boyfriend  Disposition Plan: Management as outlined above.    LOS: 2 days   [facility]ucahKRISHNAN,Jessica [name]  [facility] Pager [phone] 10/05/2016, 9:13 AM  If 7PM-7AM, please contact night-coverage at [email or web address], password [email or web address]

## 2016-10-05 NOTE — Progress Notes (Signed)
Tech offered Pt a bath. Pt stated that she would like to wait until her daughter came back to see if she wanted to do her bath. Tech explain to Pt that daughter did not have to do bath, that is the techs' responsibility. Pt stated she was aware of that and still would like to wait on daughter.

## 2016-10-06 DIAGNOSIS — N179 Acute kidney failure, unspecified: Secondary | ICD-10-CM

## 2016-10-06 DIAGNOSIS — N186 End stage renal disease: Secondary | ICD-10-CM

## 2016-10-06 MED ORDER — HYDROMORPHONE HCL 2 MG PO TABS: 1.0000 mg | ORAL_TABLET | Freq: Three times a day (TID) | ORAL | 0 refills | Status: AC

## 2016-10-06 MED ORDER — WHITE PETROLATUM GEL
Status: AC
  Filled 2016-10-06: qty 1

## 2016-10-06 MED ORDER — LORAZEPAM 0.5 MG PO TABS: 0.2500 mg | ORAL_TABLET | Freq: Two times a day (BID) | ORAL | Status: DC

## 2016-10-06 MED ORDER — HYDROMORPHONE HCL 1 MG/ML IJ SOLN: 0.2500 mg | INTRAMUSCULAR | 0 refills | Status: AC | PRN

## 2016-10-06 MED ORDER — LORAZEPAM 2 MG/ML IJ SOLN: 1.0000 mg | INTRAMUSCULAR | 0 refills | Status: AC | PRN

## 2016-10-06 NOTE — Discharge Summary (Addendum)
[facility]  Physician Discharge Summary   Patient ID: Jessica MoccasinSherita [name] MRN: [id number] DOB/AGE: 1961-02-21 56 y.o.  Admit date: 10/03/2016 Discharge date: 10/06/2016  PCP: Patient, No Pcp Per  DISCHARGE DIAGNOSES:  Principal Problem:   End-stage systolic heart failure, acute on chronic (HCC) Active Problems:   Pulmonary hypertension (HCC)   Acute on chronic respiratory failure with hypoxia (HCC)   Diabetes mellitus, type 2 (HCC)   CKD (chronic kidney disease) stage 5, GFR less than 15 ml/min (HCC)   AICD (automatic cardioverter/defibrillator) present   HLD (hyperlipidemia)   H/O atrial flutter   Chronic nausea   RECOMMENDATIONS FOR OUTPATIENT FOLLOW UP: 1. Patient being discharged to residential hospice.  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 She was discharged home on dobutamine infusion for comfort measures only and was under the care of palliative medicine. Patient presented to the ED with complaints of worsening shortness of breath and nausea. Chest x-ray revealed worsening interstitial edema. After discussions with family, patient was admitted mainly for comfort care.  Consultations:  Palliative medicine  Heart failure team    HOSPITAL COURSE:    Acute and chronic respiratory failure with hypoxemia secondary to end-stage systolic CHF. Patient's symptoms actually resolved before any specific treatments were provided. Patient was recently discharged home on a dobutamine  infusion under palliative medicine care. Patient and family understands that there is no definitive treatment that can be offered anymore. Patient stabilized during her stay in the hospital. Palliative medicine was consulted along with heart failure team. After numerous discussions with family dobutamine infusion was turned off. Per cardiology there is no role for resumption of dobutamine at this time. We definitely agree with this. Her AICD was deactivated as well. Disposition remains the main issue now. Palliative medicine, hospice and family to discuss this further later today. There is a bed available at residential hospice/[facility]. If patient and family agree she could be transferred there later today. Symptomatically, she has been stable.  Diabetes mellitus type 2. SSI. Monitor CBGs. No Lantus per family wishes. CBGs are stable.  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 Previously deemed not a dialysis candidate.  History of atrial flutter. Currently in sinus rhythm. Amiodarone and anticoagulation has been discontinued.  Await final disposition and further plan of care based on family meeting this evening.  [facility] Ok for [facility].     PERTINENT LABS:  The results of significant diagnostics from this hospitalization (including imaging, microbiology, ancillary and laboratory) are listed below for reference.     Labs: Basic Metabolic Panel:  Recent Labs Lab 09/30/16 0320 10/01/16 0440 10/03/16 0436  NA 119* 119* 117*  K 3.8 4.1 4.0  CL 76* 76* 75*  CO2 30 29 28   GLUCOSE 133* 139* 106*  BUN 115* 117* 129*  CREATININE 5.39* 5.53* 6.06*  CALCIUM 9.3 8.9 9.2   CBC:  Recent Labs Lab 09/30/16 0320 10/01/16 0440 10/03/16 0436  WBC 5.1 5.1 4.7  NEUTROABS  --   --  4.2  HGB 8.5* 8.4* 8.5*  HCT 24.5*  24.8* 25.2*  MCV 79.8 78.7 78.8  PLT 138* 153 152   Cardiac Enzymes:  Recent Labs Lab  10/03/16 0436  TROPONINI 0.08*   BNP: BNP (last 3 results)  Recent Labs  10/03/16 0436  BNP 2,424.2*    CBG:  Recent Labs Lab 10/01/16 0724 10/01/16 1137 10/01/16 1251 10/03/16 0846 10/03/16 1304  GLUCAP 211* 67 89 159* 131*     IMAGING STUDIES Dg Chest 2 View  Result Date: 10/03/2016 CLINICAL DATA:  Dyspnea tonight EXAM: CHEST  2 VIEW COMPARISON:  09/10/2016 FINDINGS: Moderate cardiomegaly, unchanged. A grossly intact transvenous cardiac leads. Right upper extremity PICC line appears satisfactorily positioned. No confluent airspace consolidation. No effusion. No pneumothorax. Normal pulmonary vasculature. Moderate interstitial fluid or thickening, worsened. IMPRESSION: 1. Worsened interstitial fluid or thickening. No confluent airspace consolidation. 2. Unchanged cardiomegaly. Electronically Signed   By: Ellery Plunk M.D.   On: 10/03/2016 05:03   Dg Chest Port 1 View  Result Date: 09/10/2016 CLINICAL DATA:  Fever and cough EXAM: PORTABLE CHEST 1 VIEW COMPARISON:  08/24/2016 FINDINGS: Cardiac shadow is enlarged. Defibrillator is again seen and stable. Right-sided PICC line is noted in satisfactory position. New right dialysis catheter is noted as well. The lungs are well aerated bilaterally. Mild vascular congestion remains and stable from the prior exam. No focal confluent infiltrate is seen. IMPRESSION: Persistent mild vascular congestion. Electronically Signed   By: Alcide Clever M.D.   On: 09/10/2016 08:35    DISCHARGE EXAMINATION: See progress note from earlier today  DISPOSITION: [facility]    ALLERGIES:  Allergies  Allergen Reactions  . Nyquil Multi-Symptom [Pseudoeph-Doxylamine-Dm-Apap] Other (See Comments)    Fatigue, chest pressure and pain Pt tolerates Coricidin cough syrup at home  . Sudafed [Pseudoephedrine] Other (See Comments)    Fatigue, chest pressure and pain  . Lasix [Furosemide]     Rash  . Torsemide     Rash. Has tolerated admission 7/18   . Zocor [Simvastatin]     Myalgias   . Latex Rash  . Ramipril Cough      Current Discharge Medication List    START taking these medications   Details  HYDROmorphone (DILAUDID) 1 MG/ML injection Inject 0.25 mLs (0.25 mg total) into the vein every 2 (two) hours as needed (DYSPNEA or DISTRESS). Qty: 1 mL, Refills: 0    HYDROmorphone (DILAUDID) 2 MG tablet Take 0.5 tablets (1 mg total) by mouth 3 (three) times daily. Qty: 30 tablet, Refills: 0    LORazepam (ATIVAN) 2 MG/ML injection Inject 0.5 mLs (1 mg total) into the vein every 4 (four) hours as needed for anxiety (or air hunger). Qty: 1 mL, Refills: 0      CONTINUE these medications which have NOT CHANGED   Details  acetaminophen (TYLENOL) 500 MG tablet Take 1,000 mg by mouth every 6 (six) hours as needed (pain).     albuterol (PROVENTIL) (2.5 MG/3ML) 0.083% nebulizer solution Take 2.5 mg by nebulization every 6 (six) hours as needed for wheezing or shortness of breath.    feeding supplement (BOOST / RESOURCE BREEZE) LIQD Take 1 Container by mouth 3 (three) times daily between meals. Refills: 0    hydrOXYzine (ATARAX/VISTARIL) 25 MG tablet Take 1 tablet (25 mg total) by mouth every 4 (four) hours as needed for itching. Qty: 30 tablet, Refills: 0    LORazepam (ATIVAN) 0.5 MG tablet Place 0.5 tablets (0.25 mg total) under the tongue every 6 (six) hours as needed for anxiety (tremors). Qty: 30 tablet,  Refills: 0    senna (SENOKOT) 8.6 MG TABS tablet Take 2 tablets (17.2 mg total) by mouth 2 (two) times daily. Qty: 120 each, Refills: 0      STOP taking these medications     amiodarone (PACERONE) 200 MG tablet      apixaban (ELIQUIS) 2.5 MG TABS tablet      DOBUTamine (DOBUTREX) 4-5 MG/ML-% infusion      docusate sodium (COLACE) 100 MG capsule      insulin glargine (LANTUS) 100 UNIT/ML injection      metoCLOPramide (REGLAN) 5 MG tablet      oxyCODONE (ROXICODONE) 5 MG/5ML solution      polyethylene glycol  (MIRALAX / GLYCOLAX) packet      pravastatin (PRAVACHOL) 20 MG tablet      torsemide (DEMADEX) 20 MG tablet           TOTAL DISCHARGE TIME: 35 mins  [facility] HospitalKRISHNAN,Nathanie Ottley  [facility] Pager [phone]  10/06/2016, 6:15 PM

## 2016-10-06 NOTE — Progress Notes (Signed)
Daily Progress Note   Patient Name: Jessica [name]       Date: 10/06/2016 DOB: November 03, 1960  Age: 56 y.o. MRN#: [id number] Attending Physician: Bonnielee Haff, MD Primary Care Physician: Patient, No Pcp Per Admit Date: 10/03/2016  Reason for Consultation/Follow-up: Establishing goals of care, Hospice Evaluation, Inpatient hospice referral and Psychosocial/spiritual support  Subjective: Patient was seen and evaluated by Dr. Rhea Pink on 10/05/2016 where goals of care were addressed as well as medication management and plans were in place to transition to residential hospice on 10/06/2016. Her dobutamine has been weaned off; AICD is now off. She was started on scheduled oral hydromorphone 1 mg 3 times daily as well as oral Ativan 0.25 twice daily. When hospice and palliative care of [place] stopped by to begin admission process, patient began to verbalize concerns regarding issues such as wanting chest compressions. Asked by hospice and palliative care of [facility] liaison, Margaretmary Eddy to be part of family meeting today.  Length of Stay: 3  Current Medications: Scheduled Meds:  . feeding supplement  1 Container Oral TID BM  . HYDROmorphone  1 mg Oral TID  . LORazepam  0.25 mg Oral BID  . senna  2 tablet Oral QHS    Continuous Infusions: . sodium chloride 10 mL/hr at 10/03/16 1358    PRN Meds: acetaminophen **OR** acetaminophen, albuterol, heparin lock flush, HYDROmorphone (DILAUDID) injection, hydrOXYzine, LORazepam, ondansetron (ZOFRAN) IV **OR** promethazine, sodium chloride, sodium chloride flush  Physical Exam  Constitutional: She is oriented to person, place, and time.  Acutely ill appearing older female  Cardiovascular: Normal rate.   Pulmonary/Chest: Effort  normal.  Neurological: She is alert and oriented to person, place, and time.  Skin: Skin is warm and dry.  Psychiatric: She has a normal mood and affect. Her behavior is normal. Judgment and thought content normal.  Nursing note and vitals reviewed.           Vital Signs: BP 93/60 (BP Location: Left Arm)   Pulse (!) 56   Temp 98.2 F (36.8 C)   Resp 16   Ht '5\' 5"'$  (1.651 m)   Wt 56.7 kg (125 lb)   SpO2 100%   BMI 20.80 kg/m  SpO2: SpO2: 100 % O2 Device: O2 Device: Nasal Cannula O2 Flow Rate: O2 Flow Rate (L/min): 2 L/min  Intake/output summary:  Intake/Output Summary (Last 24 hours) at 10/06/16 1649 Last data filed at 10/06/16 1638  Gross per 24 hour  Intake           466.17 ml  Output                0 ml  Net           466.17 ml   LBM: Last BM Date: 10/04/16 Baseline Weight: Weight: 56.7 kg (125 lb) Most recent weight: Weight: 56.7 kg (125 lb)       Palliative Assessment/Data:    Flowsheet Rows     Most Recent Value  Intake Tab  Referral Department  Hospitalist  Unit at Time of Referral  ER  Palliative Care Primary Diagnosis  Cardiac  Date Notified  10/03/16  Palliative Care Type  Return patient Palliative Care  Reason for referral  Clarify Goals of Care, End of Life Care Assistance  Date of Admission  10/03/16  Date first seen by Palliative Care  10/03/16  # of days Palliative referral response time  0 Day(s)  # of days IP prior to Palliative referral  0  Clinical Assessment  Psychosocial & Spiritual Assessment  Palliative Care Outcomes      Patient Active Problem List   Diagnosis Date Noted  . CHF (congestive heart failure) ([place]) 10/03/2016  . End-stage systolic heart failure, acute on chronic ([place]) 10/03/2016  . DVT (deep venous thrombosis) ([place]) 10/03/2016  . Pulmonary hypertension ([place]) 10/03/2016  . Acute on chronic respiratory failure with hypoxia ([place]) 10/03/2016  . Diabetes mellitus, type 2 ([place]) 10/03/2016  . CKD (chronic kidney disease)  stage 5, GFR less than 15 ml/min (HCC) 10/03/2016  . AICD (automatic cardioverter/defibrillator) present 10/03/2016  . HLD (hyperlipidemia) 10/03/2016  . H/O atrial flutter 10/03/2016  . Chronic nausea 10/03/2016  . Diabetes mellitus with complication ([place])   . End of life care   . ARF (acute renal failure) ([place])   . Palliative care by specialist   . Goals of care, counseling/discussion   . Advance care planning   . Cardiogenic shock ([place]) 08/30/2016  . Typical atrial flutter ([place])   . Atrial fibrillation with RVR ([place]) 08/22/2016  . CAD (coronary artery disease) 01/15/2015  . Impetigo 01/15/2015  . AKI (acute kidney injury) ([place]) 01/15/2015  . Hyponatremia 01/15/2015  . Hypomagnesemia 01/15/2015  . Diabetes mellitus type 2, uncontrolled ([place]) 01/15/2015  . Acute on chronic systolic (congestive) heart failure ([place]) 01/13/2015  . Chronic systolic CHF (congestive heart failure) ([place]) 05/05/2014  . Ischemic cardiomyopathy 05/05/2014  . COPD (chronic obstructive pulmonary disease) ([place]) 05/05/2014  . Encounter for therapeutic drug monitoring 04/28/2014  . LV (left ventricular) mural thrombus ([place]) 03/25/2014  . Rheumatoid factor positive 10/26/2013  . OSA (obstructive sleep apnea) 09/03/2013  . Pulmonary HTN ([place]) 09/03/2013  . PAD (peripheral artery disease) ([place]) 10/06/2012  . Chest pain 07/03/2012  . VT (ventricular tachycardia) ([place]) 12/20/2011  . Depression with anxiety 04/30/2011  . Rash 08/24/2010  . Deep vein thrombosis of lower leg ([place]) 06/28/2010  . HYPOPOTASSEMIA 12/24/2008  . HYPERCHOLESTEROLEMIA 07/15/2008  . NICOTINE ADDICTION 07/15/2008  . DYSPNEA 03/21/2008  . NONSPECIFIC LOW BP 03/21/2008  . ICD (implantable cardioverter-defibrillator) in place 03/21/2008    Palliative Care Assessment & Plan   Patient Profile: 56 year old African-American female with a past medical history of atrial flutter, pulmonary hypertension, coronary artery disease, diabetes,  end-stage biventricular congestive heart failure with recent cardiogenic shock, chronic kidney disease  secondary to cardiorenal syndrome, history of prior LV thrombus recently discharged home by cardiology on 7/23 after a protracted hospital stay. She was discharged home on dobutamine infusion for comfort measures only and was under the care of palliative medicine. Patient presented to the ED with complaints of worsening shortness of breath and nausea. Chest x-ray revealed worsening interstitial edema. After discussions with family, patient was admitted mainly for comfort care  Assessment: Met with pt's 2 sons, Elaina Hoops, daughter Hal Hope as well as pt.Pt at this point had decided on [facility] . She is talking in terms of understanding that [facility] was for EOL, that she was at EOL; telling her family how much she loved them. No further issues regarding code status were raised. Her children are in agreement with transfer to [facility] this evening.  Pt is up in a recliner. Able to carry on a conversation without frank dyspnea since starting scheduled dilaudid ( pt is also off dobutamine ). Pt verbalized that she was not SHOB; no anxiety.  Recommendations/Plan:  Transfer to [facility] for EOL care  Cont scheduled hydromorphone for dyspnea  Goals of Care and Additional Recommendations:  Limitations on Scope of Treatment: Full Comfort Care  Code Status:    Code Status Orders        Start     Ordered   10/03/16 0752  Do not attempt resuscitation (DNR)  Continuous    Question Answer Comment  In the event of cardiac or respiratory ARREST Do not call a "code blue"   In the event of cardiac or respiratory ARREST Do not perform Intubation, CPR, defibrillation or ACLS   In the event of cardiac or respiratory ARREST Use medication by any route, position, wound care, and other measures to relive pain and suffering. May use oxygen, suction and manual treatment of airway  obstruction as needed for comfort.      10/03/16 0753    Code Status History    Date Active Date Inactive Code Status Order ID Comments User Context   10/03/2016  5:36 AM 10/03/2016  7:53 AM DNR [id number]  Ward, Delice Bison, DO ED   08/22/2016  6:44 PM 10/01/2016  6:12 PM Full Code [id number]  Conrad Notasulga, NP Inpatient   07/20/2016  2:17 PM 07/21/2016  3:37 PM Full Code [id number]  Evans Lance, MD Inpatient   01/13/2015  7:30 PM 01/15/2015  5:38 PM Full Code [id number]  Consuelo Pandy, PA-C Inpatient   07/23/2013 10:16 AM 07/23/2013  8:01 PM Full Code [id number]  Jolaine Artist, MD Inpatient   12/20/2011  3:22 PM 12/23/2011  3:14 PM Full Code [id number]  Berniece Salines Smothers, RN Inpatient       Prognosis:   < 2 weeks in the setting of end stage heart failure with EF 15%, renal failure with creatinine 6.06. No HD  Discharge Planning:  Hospice facility  Care plan was discussed with Dr. Rowe Pavy  Thank you for allowing the Palliative Medicine Team to assist in the care of this patient.   Time In: 1700 Time Out: 1800 Total Time 60 min Prolonged Time Billed  no       Greater than 50%  of this time was spent counseling and coordinating care related to the above assessment and plan.  Dory Horn, NP  Please contact Palliative Medicine Team phone at [phone] for questions and concerns.

## 2016-10-06 NOTE — Progress Notes (Addendum)
1700---[facility]---[facility] RN Note  Met with patient and patient's 2 sons and daughter and daughter in law this evening. Confirmed interest in [facility], offered support and answered all questions. Patient and family agreeable to transfer today. CSW Oretha Ellis aware. Registration paper work completed. Dr. Orpah Melter to assume care per patient request as patient does not have a primary care provider.   Please fax discharge summary to [phone].   RN please call report to [phone].  Please arrange for transport for patient to arrive as soon as possible.  [facility] was called at 1820 by myself for transport.  Thank you, Margaretmary Eddy, [facility] Liaison [phone]  [place] are on [facility]ION.

## 2016-10-06 NOTE — Clinical Social Work Note (Signed)
Clinical Social Work Assessment  Patient Details  Name: Jessica [name] MRN: [id number] Date of Birth: 1960-09-20  Date of referral:  10/06/16               Reason for consult:  Discharge Planning, End of Life/Hospice                Permission sought to share information with:  Family Supports Permission granted to share information::  No  Name::        Agency::     Relationship::     Contact Information:     Housing/Transportation Living arrangements for the past 2 months:  Single Family Home Source of Information:  Patient Patient Interpreter Needed:  None Criminal Activity/Legal Involvement Pertinent to Current Situation/Hospitalization:  No - Comment as needed Significant Relationships:  Adult Children, Other Family Members, Friend, Significant Other Lives with:  Adult Children (Daughter) Do you feel safe going back to the place where you live?  Yes Need for family participation in patient care:  Yes (Comment)  Care giving concerns:  No care giving concerns identified.    Social Worker assessment / plan:  CSW received consult for "Apparently Research officer, trade union has approved for her to be at [facility]. Please verify. Patient could be sent there if they have a bed open. Thanks!; residential hospice placement." CSW met with pt at bedside. No family present. CSW introduced herself and explained role of social work. CSW offered hospice choice. Pt in agreement with transitioning to [facility] upon d/c. CSW confirmed with [facility] and [place] that a [facility] bed is available today.   Olivia Mackie met with pt and per Olivia Mackie pt may want aggressive treatment vs comfort care. Trayc updated MD and Palliative of concerns. Olivia Mackie to meet with pt and family at 5pm to discuss pt disposition. CSW continuing to follow.   Employment status:  Disabled (Comment on whether or not currently receiving Disability) Insurance information:  Other (Comment Required) (UHC  Medicare) PT Recommendations:  Not assessed at this time Information / Referral to community resources:  Other (Comment Required) (Hospice Residential)  Patient/Family's Response to care:  Pt appreciative of support and guidance.   Patient/Family's Understanding of and Emotional Response to Diagnosis, Current Treatment, and Prognosis:  Unsure of pt's understanding of current medical state. Pt indecisive yet calm about discharge options.   Emotional Assessment Appearance:  Appears stated age Attitude/Demeanor/Rapport:  Other (Appropriate) Affect (typically observed):  Accepting, Pleasant Orientation:  Oriented to Self, Oriented to Place, Oriented to  Time, Oriented to Situation Alcohol / Substance use:  Other Psych involvement (Current and /or in the community):  No (Comment)  Discharge Needs  Concerns to be addressed:  Discharge Planning Concerns, Care Coordination Readmission within the last 30 days:  No Current discharge risk:  Terminally ill, Chronically ill Barriers to Discharge:  Continued Medical Work up (disposition choice)   Truitt Merle, LCSW 10/06/2016, 4:56 PM

## 2016-10-06 NOTE — Progress Notes (Signed)
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 She was discharged home on dobutamine infusion for comfort measures only and was under the care of palliative medicine. Patient presented to the ED with complaints of worsening shortness of breath and nausea. Chest x-ray revealed worsening interstitial edema. After discussions with family, patient was admitted mainly for comfort care.  Reason for Visit: End-stage biventricular congestive heart failure  Consultants: Palliative medicine. Cardiology.  Procedures: None  Antibiotics: None  Subjective/Interval History: Patient without any complaints. She feels well.   ROS: No nausea or vomiting  Objective:  Vital Signs  Vitals:   10/05/16 0547 10/05/16 2054 10/05/16 2349 10/06/16 0500  BP: (!) 108/54 (!) 92/48 92/60 (!) 95/51  Pulse: (!) 56 (!) 57 (!) 58 (!) 56  Resp: 18 17 18 16   Temp: 98.1 F (36.7 C) 98.9 F (37.2 C)  98.2 F (36.8 C)  TempSrc: Oral Oral    SpO2: 100% 100% 100% 100%  Weight:      Height:        Intake/Output Summary (Last 24 hours) at 10/06/16 0955 Last data filed at 10/06/16 0537  Gross per 24 hour  Intake           346.17 ml  Output                0 ml  Net           346.17 ml   Filed Weights   10/03/16 0420  Weight: 56.7 kg (125 lb)    General appearance: Awake, alert. No distress. Resp: Few crackles noted bilateral lung fields. No wheezing. Normal effort. Cardio: S1, S2 is normal, regular. No S3, S4. No  rubs, murmurs or bruit GI: Abdomen soft. Nontender, nondistended. Bowel sounds are present. No masses, organomegaly Neurologic: No focal neurological deficits  Lab Results:  Data Reviewed: I have personally reviewed following labs and imaging studies  CBC:  Recent Labs Lab 09/30/16 0320 10/01/16 0440 10/03/16 0436  WBC 5.1 5.1 4.7  NEUTROABS  --   --  4.2  HGB 8.5* 8.4* 8.5*  HCT 24.5* 24.8* 25.2*  MCV 79.8 78.7 78.8  PLT 138* 153 152    Basic Metabolic Panel:  Recent Labs Lab 09/30/16 0320 10/01/16 0440 10/03/16 0436  NA 119* 119* 117*  K 3.8 4.1 4.0  CL 76* 76* 75*  CO2 30 29 28   GLUCOSE 133* 139* 106*  BUN 115* 117* 129*  CREATININE 5.39* 5.53* 6.06*  CALCIUM 9.3 8.9 9.2    GFR: Estimated Creatinine Clearance: 9.1 mL/min (A) (by C-G formula based on SCr of 6.06 mg/dL (H)).   Cardiac Enzymes:  Recent Labs Lab 10/03/16 0436  TROPONINI 0.08*    CBG:  Recent Labs Lab 10/01/16 0724 10/01/16 1137 10/01/16 1251 10/03/16 0846 10/03/16 1304  GLUCAP 211* 67 89 159* 131*    Radiology Studies: No results found.   Medications:  Scheduled: . feeding supplement  1 Container Oral TID BM  . HYDROmorphone  1 mg Oral TID  . LORazepam  0.5 mg Oral BID  . senna  2 tablet Oral QHS  . white  petrolatum       Continuous: . sodium chloride 10 mL/hr at 10/03/16 1358   ZOX:[id number]:acetaminophen **OR** acetaminophen, albuterol, heparin lock flush, HYDROmorphone (DILAUDID) injection, hydrOXYzine, LORazepam, ondansetron (ZOFRAN) IV **OR** promethazine, sodium chloride, sodium chloride flush  Assessment/Plan:  Principal Problem:   End-stage systolic heart failure, acute on chronic (HCC) Active Problems:   Pulmonary hypertension (HCC)   Acute on chronic respiratory failure with hypoxia (HCC)   Diabetes mellitus, type 2 (HCC)   CKD (chronic kidney disease) stage 5, GFR less than 15 ml/min (HCC)   AICD (automatic cardioverter/defibrillator) present   HLD  (hyperlipidemia)   H/O atrial flutter   Chronic nausea    Acute and chronic respiratory failure with hypoxemia secondary to end-stage systolic CHF. Patient's symptoms actually resolved before any specific treatments were provided. Patient was recently discharged home on a dobutamine infusion under palliative medicine care. Patient and family understands that there is no definitive treatment that can be offered anymore. Patient stabilized during her stay in the hospital. Appreciate palliative medicines involvement and coordination of care with cardiology. Dobutamine infusion was discontinued yesterday. AICD was supposed to be deactivated yesterday, unsure if this has been done on not. Apparently, [facility] by Ship brokertheir director. We will have social worker verify this. If the family and patient agree, then she could be transferred there this weekend. Symptomatically, she is stable.   Diabetes mellitus type 2. SSI. Monitor CBGs. No Lantus per family wishes. CBGs are stable.  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 Previously deemed not a dialysis candidate.  History of atrial flutter. Currently in sinus rhythm. Amiodarone and anticoagulation has been discontinued.  Goals of care Patient is DNR/DNI. Tentative plan is for patient to go to residential hospice.  DVT Prophylaxis: Comfort Care    Code Status: DO NOT RESUSCITATE  Family Communication: Discussed with the patient and her boyfriend  Disposition Plan: Management as outlined above. Possible discharge to residential hospice.    LOS: 3 days   [facility] D[facility] Mer[facility] South Surgery CenterKRISHNAN,Brand Siever  [facility] Pager [phone] 10/06/2016, 9:55 AM  If 7PM-7AM, please contact night-coverage at [email or web address], password [email or web address]

## 2016-10-06 NOTE — Discharge Instructions (Signed)
Hospice Introduction Hospice is a service that is designed to provide people who are terminally ill and their families with medical, spiritual, and psychological support. Its aim is to improve your quality of life by keeping you as alert and comfortable as possible. Who will be my providers when I begin hospice care? Hospice teams often include:  A nurse.  A doctor. The hospice doctor will be available for your care, but you can bring your regular doctor or nurse practitioner.  Social workers.  Religious leaders (such as a Clinical biochemist).  Trained volunteers.  What roles will providers play in my care? Hospice is performed by a team of health care professionals and volunteers who:  Help keep you comfortable: ? Hospice can be provided in your home or in a homelike setting. ? The hospice staff works with your family and friends to help meet your needs. ? You will enjoy the support of loved ones by receiving much of your basic care from family and friends.  Provide pain relief and manage your symptoms. The staff supply all necessary medicines and equipment.  Provide companionship when you are alone.  Allow you and your family to rest. They may do light housekeeping, prepare meals, and run errands.  Provide counseling. They will make sure your emotional, spiritual, and social needs and those of your family are being met.  Provide spiritual care: ? Spiritual care will be individualized to meet your needs and your family's needs. ? Spiritual care may involve:  Helping you look at what death means to you.  Helping you say goodbye to your family and friends.  Performing a specific religious ceremony or ritual.  When should hospice care begin? Most people who use hospice are believed to have fewer than 6 months to live.  Your family and health care providers can help you decide when hospice services should begin.  If your condition improves, you may discontinue the program.  What  should I consider before selecting a program? Most hospice programs are run by nonprofit, independent organizations. Some are affiliated with hospitals, nursing homes, or home health care agencies. Hospice programs can take place in the home or at a hospice center, hospital, or skilled nursing facility. When choosing a hospice program, ask the following questions:  What services are available to me?  What services will be offered to my loved ones?  How involved will my loved ones be?  How involved will my health care provider be?  Who makes up the hospice care team? How are they trained or screened?  How will my pain and symptoms be managed?  If my circumstances change, can the services be provided in a different setting, such as my home or in the hospital?  Is the program reviewed and licensed by the state or certified in some other way?  Where can I learn more about hospice? You can learn about existing hospice programs in your area from your health care providers. You can also read more about hospice online. The websites of the following organizations contain helpful information:  The [facility] and Palliative Care Organization [facility]).  The Hospice Association of America ([place]).  The [place].  The American Cancer Society (ACS).  Hospice Net.  This information is not intended to replace advice given to you by your health care provider. Make sure you discuss any questions you have with your health care provider. Document Released: 06/15/2003 Document Revised: 10/13/2015 Document Reviewed: 01/06/2013 Elsevier Interactive Patient Education  2017 [employer].

## 2016-10-06 NOTE — Progress Notes (Signed)
Confirmed with [employer] rep, Derek JackBrent Gallman, at 1424 pm today, 10/06/16.  ICD therapies deactivated evening of 10/05/16.  Casimiro NeedleMichael [address]"Andy" [place], [place]PA-C  10/06/16

## 2016-10-06 NOTE — Progress Notes (Signed)
1400---[facility] [place] RN note  Received request from [place] for patient and family interest in [facility] with request for transfer when bed is available. Bd is available today. Chart reviewed. Met with patient to confirm interest and explain services. Offered to wait and meet with pt and family but patient wished to meet and make decisions independently as she states her family agrees with whatever she wishes to do.   Discussed [facility] with hospice as patient expressed she wasn't sure if she wished residential hospice or returning home. She expressed her fear of her grandchildren "finding her" and she also expressed her daughter works two jobs so she was unsure who would be able to help care for her in the home. [facility] appeals to patient though she then expressed that she may want to consider the option of further aggressive therapy with Dobutamine, or other, and she expressed that she would like chest compressions but not intubation or being placed on a ventilator.  Called and discussed with Dr. [place] Pink. Called and spoke with Dr. Rowe Pavy who suggested meeting with children and patient to reiterate previous discussions with patient and family all present. Spoke with son Edd Arbour, who is HCPOA and daughter-in-law who arrived about the wishes the patient expressed. Edd Arbour called the other children, son and daughter to see if they would be able to come to meet and discuss hospice care as a family. At this time 5 pm meeting is scheduled for today.  Thank you, Margaretmary Eddy, [place] [phone]  [place] are on AMION.

## 2016-10-06 NOTE — Progress Notes (Signed)
Report given to Noralyn PickSylvia Tuck, [facility].

## 2016-10-06 NOTE — Consult Note (Signed)
Advanced Heart Failure Team Consult Note   Referring Physician: Dr. Mellody DrownKrishna Primary Cardiologist:  Jai Bear  Reason for Consultation: *End-stage HF. 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 Neoma LamingKrishan to see to participate in end-of-life discussions  She has long h/o of CHF due to iCM. She is s/p multiple PCIs.  Admitted about 6 weeks ago with progressive CHF and cardiogenic shock in setting of new-onset AFL. Developed progressive renal failure. Was maintained on dual inotropes and eventually progressed to needing CVVHD. Underwent TEE-DCCV with conversion to NSR.  Had mild kidney recovery and eventually weaned off of CVVHD with dual inotorpes (dobutamine and NE). Not candidate for advanced therapies.   Eventually NE weaned off and she went home on dobutamine 6810mcg/kg/min for palliation.Refused Hospice at that time.  Unfortunately readmitted with recurrent HF and labs showed progressive renal failure with Cr > 6 and BUN ~120. Hospice again was involved. Patient and family requested second opinion from outside centers regarding eligibility for heart-kidney transplant. This was discussed with several centers and all agreed she was not a candidate for dual organ transplantation. After multiple discussions ICD and dobutamine recently turned off. She is pending [facility] today but family and patient again expressed second thoughts and we are asked to consult to review options.   Currently feels weak but otherwise comfortable at rest. Major complaint is severe tremor/asterixis related to uremia. She understands how sick she is. Denies CP or palpitations. No orthopnea or PND. Appetite poor.     Review of Systems: [y] = yes, [ ]  = no   General: Weight gain Cove.Etienne[y ]; Weight loss [ ] ; Anorexia Cove.Etienne[y ]; Fatigue [ y]; Fever [ ] ; Chills [ ] ; Weakness [ y]   Cardiac: Chest pain/pressure [ ] ; Resting SOB [ ] ; Exertional SOB [ y]; Orthopnea [ ] ; Pedal Edema [ ] ; Palpitations [ ] ; Syncope [ ] ; Presyncope [ ] ; Paroxysmal nocturnal dyspnea[ ]   Pulmonary: Cough [ ] ; Wheezing[ ] ; Hemoptysis[ ] ; Sputum [ ] ; Snoring [ ]   GI: Vomiting[ ] ; Dysphagia[ ] ; Melena[ ] ; Hematochezia [ ] ; Heartburn[ ] ; Abdominal pain [ ] ; Constipation [ ] ; Diarrhea [ ] ; BRBPR [ ]   GU: Hematuria[ ] ; Dysuria [ ] ; Nocturia[ ]   Vascular: Pain in legs with walking [ ] ; Pain in feet with lying flat [ ] ; Non-healing sores [ ] ; Stroke [ ] ; TIA [ ] ; Slurred speech [ ] ;  Neuro: Headaches[ ] ; Vertigo[ ] ; Seizures[ ] ; Paresthesias[ ] ;Blurred vision [ ] ; Diplopia [ ] ; Vision changes [ ]   Ortho/Skin: Arthritis Cove.Etienne[y ]; Joint pain Cove.Etienne[y ]; Muscle pain [ ] ; Joint swelling [ ] ; Back Pain [ ] ; Rash Cove.Etienne[y ]  Psych: Depression[ y]; Anxiety[y ]  Heme: Bleeding problems [ ] ; Clotting disorders [ ] ; Anemia Cove.Etienne[y ]  Endocrine: Diabetes [ ] ; Thyroid dysfunction[ ]   Medications  Scheduled Meds: . feeding supplement  1 Container Oral TID BM  . HYDROmorphone  1 mg Oral TID  . LORazepam  0.25 mg Oral BID  . senna  2 tablet Oral QHS   Continuous Infusions: . sodium chloride 10 mL/hr at 10/03/16 1358   PRN Meds:.acetaminophen **OR** acetaminophen, albuterol, heparin lock flush, HYDROmorphone (DILAUDID) injection, hydrOXYzine, LORazepam, ondansetron (ZOFRAN) IV **OR** promethazine, sodium chloride, sodium chloride flush sched  Past Medical History: Past Medical History:  Diagnosis Date  .  Acute respiratory failure (HCC) 09/2016  . AICD (automatic cardioverter/defibrillator) present    a. Biotronik placed 2009. b. ICD discharge 2011, 03/2013.  [name]. Alcohol abuse   . Anxiety   . Arterial embolism (HCC)    a. H/o RLE extremity ischemia in 2012 s/p right iliofemoral embolectomy with compartment fasciotomy.  . Asthma   . CAD (coronary artery disease)    a. 1996: anterior wall MI tx with PTCA. b. 2003: PCI/DES to  circumflex marginal. c. 2004: PTCA/CBA/stenting of ostium of D1 and LAD. d. 06/2003: s/p PTCA/DES of mRCA. e. 11/2003: CBA of bifurcation diagonal LAD. f. 2007: PTCA/DES to RCA, CBA/stenting to OM2.  . Chronic nausea   . Chronic systolic heart failure (HCC)    a.  secondary to severe ischemic cardiomyopathy with EF 20-25%;  b. s/p rimplantation of Biotronik single chamber ICD by Dr Ladona Ridgelaylor 02/2008;  c.  RHC 07/2013 showed well compensated left sided pressures with moderate PAH and low cardiac output. d. 2D Echo 03/2014: EF 20-25%, grade 2 DD, PAP 58mmHg.  . CKD (chronic kidney disease), stage II   . COPD (chronic obstructive pulmonary disease) (HCC)   . Diabetes mellitus, type 2 (HCC)   . Dizziness   . DVT (deep venous thrombosis) (HCC)    a. 06/2010.  [name]. GERD (gastroesophageal reflux disease)   . History of noncompliance with medical treatment   . HLD (hyperlipidemia)   . LV (left ventricular) mural thrombus    a. Dx on echo 03/2014.  [name]. Myocardial infarction (HCC)   . Pulmonary hypertension (HCC)   . Sleep apnea   . Tobacco abuse   . [facility](HCC)     Past Surgical History: Past Surgical History:  Procedure Laterality Date  . CARDIAC CATHETERIZATION    . CARDIOVERSION N/A 08/27/2016   Procedure: CARDIOVERSION;  Surgeon: Dolores PattyBensimhon, Charlynn Salih R, MD;  [facility];  Service: Cardiovascular;  Laterality: N/A;  . DIAGNOSTIC LAPAROSCOPY    . ICD GENERATOR CHANGEOUT N/A 07/20/2016   Procedure: ICD Generator Changeout;  Surgeon: Marinus Mawaylor, Gregg W, MD;  [facility];  Service: Cardiovascular;  Laterality: N/A;  . INSERT / REPLACE / REMOVE PACEMAKER    . IR FLUORO GUIDE CV LINE RIGHT  09/05/2016  . IR US GUIDE VASC ACCESS RIGHT  09/05/2016  . RIGHT HEART CATHETERIZATION N/A 07/23/2013   Procedure: RIGHT HEART CATH;  Surgeon: Dolores Pattyaniel R Zarie Kosiba, MD;  [facility];  Service: Cardiovascular;  Laterality: N/A;  . TEE WITHOUT CARDIOVERSION N/A 08/27/2016   Procedure:  TRANSESOPHAGEAL ECHOCARDIOGRAM (TEE);  Surgeon: Dolores PattyBensimhon, Cainen Burnham R, MD;  [facility];  Service: Cardiovascular;  Laterality: N/A;  . TUBAL LIGATION      Family History: Family History  Problem Relation Age of Onset  . Coronary artery disease Unknown        FMILY HISTORY    Social History: Social History   Social History  . Marital status: Divorced    Spouse name: N/A  . Number of children: N/A  . Years of education: N/A   Occupational History  . disabled Disability   Social History Main Topics  . Smoking status: Former Smoker    Packs/day: 0.25    Years: 21.00    Types: Cigars    Quit date: 08/08/2016  . Smokeless tobacco: Never Used     Comment: Smokes 2-3 cigars daily, quit smoking cigarettes 3 yrs ago.   . Alcohol use No  . Drug use: No  . Sexual activity:  No   Other Topics Concern  . None   Social History Narrative  . None    Allergies:  Allergies  Allergen Reactions  . Nyquil Multi-Symptom [Pseudoeph-Doxylamine-Dm-Apap] Other (See Comments)    Fatigue, chest pressure and pain Pt tolerates Coricidin cough syrup at home  . Sudafed [Pseudoephedrine] Other (See Comments)    Fatigue, chest pressure and pain  . Lasix [Furosemide]     Rash  . Torsemide     Rash. Has tolerated admission 7/18  . Zocor [Simvastatin]     Myalgias   . Latex Rash  . Ramipril Cough    Objective:    Vital Signs:   Temp:  [98.2 F (36.8 C)-98.9 F (37.2 C)] 98.2 F (36.8 C) (07/28 0500) Pulse Rate:  [56-58] 56 (07/28 0500) Resp:  [16-18] 16 (07/28 0500) BP: (92-95)/(48-60) 93/60 (07/28 1100) SpO2:  [100 %] 100 % (07/28 0500) Last BM Date: 10/04/16  Weight change: Filed Weights   10/03/16 0420  Weight: 56.7 kg (125 lb)    Intake/Output:   Intake/Output Summary (Last 24 hours) at 10/06/16 1815 Last data filed at 10/06/16 1638  Gross per 24 hour  Intake           466.17 ml  Output                0 ml  Net           466.17 ml      Physical Exam     General:  Sitting in chair. Chronically ill appearing. NAD HEENT: normal x for poor dentition Neck: supple. JVP jaw. Carotids 2+ bilat; no bruits. No lymphadenopathy or thyromegaly appreciated. Cor: PMI laterally displaced. Regular rate & rhythm. 2/6 TR/MR + s3 Lungs: clear Abdomen: soft, nontender, +distended. No hepatosplenomegaly. No bruits or masses. Good bowel sounds. Extremities: no cyanosis, clubbing,dema. Cool + rash Neuro: alert & orientedx3, cranial nerves grossly intact. moves all 4 extremities w/o difficulty. Affect pleasant. + asterixis   Telemetry   N/A  EKG     NSR. Personally reviewed   Labs   Basic Metabolic Panel:  Recent Labs Lab 09/30/16 0320 10/01/16 0440 10/03/16 0436  NA 119* 119* 117*  K 3.8 4.1 4.0  CL 76* 76* 75*  CO2 30 29 28   GLUCOSE 133* 139* 106*  BUN 115* 117* 129*  CREATININE 5.39* 5.53* 6.06*  CALCIUM 9.3 8.9 9.2    Liver Function Tests: No results for input(s): AST, ALT, ALKPHOS, BILITOT, PROT, ALBUMIN in the last 168 hours. No results for input(s): LIPASE, AMYLASE in the last 168 hours. No results for input(s): AMMONIA in the last 168 hours.  CBC:  Recent Labs Lab 09/30/16 0320 10/01/16 0440 10/03/16 0436  WBC 5.1 5.1 4.7  NEUTROABS  --   --  4.2  HGB 8.5* 8.4* 8.5*  HCT 24.5* 24.8* 25.2*  MCV 79.8 78.7 78.8  PLT 138* 153 152    Cardiac Enzymes:  Recent Labs Lab 10/03/16 0436  TROPONINI 0.08*    BNP: BNP (last 3 results)  Recent Labs  10/03/16 0436  BNP 2,424.2*    ProBNP (last 3 results) No results for input(s): PROBNP in the last 8760 hours.   CBG:  Recent Labs Lab 10/01/16 0724 10/01/16 1137 10/01/16 1251 10/03/16 0846 10/03/16 1304  GLUCAP 211* 67 89 159* 131*    Coagulation Studies: No results for input(s): LABPROT, INR in the last 72 hours.   Imaging    No results found.   Medications:  Current Medications: . feeding supplement  1 Container Oral TID BM  .  HYDROmorphone  1 mg Oral TID  . LORazepam  0.25 mg Oral BID  . senna  2 tablet Oral QHS     Infusions: . 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 Acute on chronic systolic HF due to iCM. EF 10% 2. Cardiogenic shock 3. AKI now with ESRD 4. AFL s/p recent DC-CV 5. Tobacco abuse 6. H/o CAD s/p multiple PCIs  I had a long talk with her and multiple family members today reiterating the topics the HF team has previously discussed with her.   She has end-stage HF c/b progressive renal failure. She has failed aggressive therapy for her HF with dobutamine and norepinephrine. Her ICD has been deactivated. She is not candidate for LVAD due to RV failure and not a candidate for heart-kidney transplant due to recent tobacco use and other comorbidities.   I explained to her and her family that 3 things are now important 1) That we realize and agree that we have exhausted all therapies for her HF 2) That we don't allow her to suffer 3) That if/when she passes we can all realize that we gave her all opportunities to recover and we did not give up too early but just that her HF was too severe.   Jessica [name] and her family were all supportive and in agreement. She is obviously scared and frustrated but is now anxious to go to [facility] so she can sit outside and enjoy the time she has left.  Case d/w [facility] and she is being transported to [facility].  Arvilla Meres, MD  6:23 PM    Length of Stay: 3  Arvilla Meres, MD  10/06/2016, 6:15 PM  Advanced Heart Failure Team Pager [phone] (M-F; 7a - 4p)  Please contact [facility] for night-coverage after hours (4p -7a ) and weekends on [email or web address]

## 2016-10-10 ENCOUNTER — Encounter (HOSPITAL_COMMUNITY): Payer: Medicare Other | Admitting: Internal Medicine

## 2016-10-19 ENCOUNTER — Telehealth (HOSPITAL_COMMUNITY): Payer: Self-pay | Admitting: *Deleted

## 2016-10-19 MED ORDER — TORSEMIDE 20 MG PO TABS: 80.0000 mg | ORAL_TABLET | Freq: Two times a day (BID) | ORAL | 3 refills | Status: AC

## 2016-10-19 MED ORDER — CAMPHOR-MENTHOL 0.5-0.5 % EX LOTN: 1.0000 "application " | TOPICAL_LOTION | CUTANEOUS | 3 refills | Status: AC | PRN

## 2016-10-19 NOTE — Telephone Encounter (Signed)
Home Hospice RN Clydie BraunKaren called to let us know that patient is now home with hospice and retaining fluid with swelling in legs and abdomen.  She also reported that patient is requesting for PICC to be removed and cream for itching. Also asked if we wanted to resume cardiac meds.   Spoke with Maxine GlennMichael Tillery, PA and he advises patient to take torsemide 80 mg BID, Serna cream for itching, and remove PICC line.  Dr. Dory HornBensimhod is not going to resume cardiac meds at this time. Clydie BraunKaren is aware of plan and medications sent to pharmacy.

## 2016-10-25 ENCOUNTER — Other Ambulatory Visit: Payer: Self-pay | Admitting: Internal Medicine

## 2016-10-30 ENCOUNTER — Encounter: Payer: Medicare Other | Admitting: Internal Medicine

## 2016-10-31 ENCOUNTER — Encounter: Payer: Self-pay | Admitting: Internal Medicine

## 2016-11-01 ENCOUNTER — Other Ambulatory Visit (HOSPITAL_COMMUNITY): Payer: Self-pay | Admitting: Cardiology

## 2016-11-05 ENCOUNTER — Telehealth: Payer: Self-pay | Admitting: Cardiology

## 2016-11-05 NOTE — Telephone Encounter (Signed)
I was paged about this pt calling for a med refill. I called Ms. Horigan who says that she is having more shortness of breath and she wants to take Sildenafil but does not have any. She says that she is using her oxygen. Per a chart review, the patient has end stage CHF. I contacted Dr. Gala Romney regarding her increased shortness of breath and request for sildenafil. He says that her BP will not tolerate this med. She should contact hospice for their input. The patient says that hospice was there today. I advised the patient that if she cannot get relief at home she can come to the ED. She acknowledges.   Berton Bon, AGNP-C [facility] 11/05/2016  7:39 PM

## 2016-11-09 ENCOUNTER — Other Ambulatory Visit (HOSPITAL_COMMUNITY): Payer: Self-pay | Admitting: Cardiology

## 2016-11-09 MED ORDER — HYDROXYZINE HCL 25 MG PO TABS: 25.0000 mg | ORAL_TABLET | ORAL | 0 refills | Status: AC | PRN

## 2016-11-16 ENCOUNTER — Ambulatory Visit (HOSPITAL_COMMUNITY)
Admission: RE | Admit: 2016-11-16 | Discharge: 2016-11-16 | Disposition: A | Discharge status: Home / Self Care | Source: Ambulatory Visit | Attending: Internal Medicine | Admitting: Internal Medicine

## 2016-11-16 ENCOUNTER — Other Ambulatory Visit (HOSPITAL_COMMUNITY): Payer: Self-pay | Admitting: Internal Medicine

## 2016-11-16 ENCOUNTER — Encounter (HOSPITAL_COMMUNITY): Payer: Self-pay | Admitting: General Surgery

## 2016-11-16 DIAGNOSIS — I5084 End stage heart failure: Secondary | ICD-10-CM | POA: Insufficient documentation

## 2016-11-16 DIAGNOSIS — R188 Other ascites: Secondary | ICD-10-CM

## 2016-11-16 DIAGNOSIS — I959 Hypotension, unspecified: Secondary | ICD-10-CM | POA: Diagnosis not present

## 2016-11-16 HISTORY — PX: IR PARACENTESIS: IMG2679

## 2016-11-16 MED ORDER — LIDOCAINE HCL (PF) 1 % IJ SOLN
INTRAMUSCULAR | Status: AC
  Filled 2016-11-16: qty 30

## 2016-11-16 NOTE — Procedures (Signed)
Ultrasound-guided therapeutic paracentesis performed yielding 1.7 liters of amber colored fluid. No immediate complications.  The procedure was terminated early secondary to hypotension.  Doil Kamara E 1:29 PM 11/16/2016

## 2016-12-03 ENCOUNTER — Encounter (HOSPITAL_COMMUNITY): Payer: Self-pay | Admitting: Emergency Medicine

## 2016-12-03 ENCOUNTER — Emergency Department (HOSPITAL_COMMUNITY)
Admission: EM | Admit: 2016-12-03 | Discharge: 2016-12-03 | Disposition: A | Discharge status: Home / Self Care | Attending: Emergency Medicine | Admitting: Emergency Medicine

## 2016-12-03 ENCOUNTER — Emergency Department (HOSPITAL_COMMUNITY)

## 2016-12-03 DIAGNOSIS — E1122 Type 2 diabetes mellitus with diabetic chronic kidney disease: Secondary | ICD-10-CM | POA: Insufficient documentation

## 2016-12-03 DIAGNOSIS — Z79899 Other long term (current) drug therapy: Secondary | ICD-10-CM | POA: Diagnosis not present

## 2016-12-03 DIAGNOSIS — I132 Hypertensive heart and chronic kidney disease with heart failure and with stage 5 chronic kidney disease, or end stage renal disease: Secondary | ICD-10-CM | POA: Insufficient documentation

## 2016-12-03 DIAGNOSIS — N185 Chronic kidney disease, stage 5: Secondary | ICD-10-CM | POA: Diagnosis not present

## 2016-12-03 DIAGNOSIS — J449 Chronic obstructive pulmonary disease, unspecified: Secondary | ICD-10-CM | POA: Diagnosis not present

## 2016-12-03 DIAGNOSIS — R600 Localized edema: Secondary | ICD-10-CM | POA: Diagnosis not present

## 2016-12-03 DIAGNOSIS — I2510 Atherosclerotic heart disease of native coronary artery without angina pectoris: Secondary | ICD-10-CM | POA: Diagnosis not present

## 2016-12-03 DIAGNOSIS — Z91040 Latex allergy status: Secondary | ICD-10-CM | POA: Diagnosis not present

## 2016-12-03 DIAGNOSIS — I5084 End stage heart failure: Secondary | ICD-10-CM | POA: Diagnosis not present

## 2016-12-03 DIAGNOSIS — R609 Edema, unspecified: Secondary | ICD-10-CM

## 2016-12-03 DIAGNOSIS — I5022 Chronic systolic (congestive) heart failure: Secondary | ICD-10-CM | POA: Insufficient documentation

## 2016-12-03 DIAGNOSIS — I252 Old myocardial infarction: Secondary | ICD-10-CM | POA: Diagnosis not present

## 2016-12-03 DIAGNOSIS — Z87891 Personal history of nicotine dependence: Secondary | ICD-10-CM | POA: Insufficient documentation

## 2016-12-03 DIAGNOSIS — R2243 Localized swelling, mass and lump, lower limb, bilateral: Secondary | ICD-10-CM | POA: Diagnosis present

## 2016-12-03 LAB — BASIC METABOLIC PANEL
ANION GAP: 14 (ref 5–15)
BUN: 113 mg/dL — ABNORMAL HIGH (ref 6–20)
CALCIUM: 8.8 mg/dL — AB (ref 8.9–10.3)
CHLORIDE: 89 mmol/L — AB (ref 101–111)
CO2: 26 mmol/L (ref 22–32)
Creatinine, Ser: 4.56 mg/dL — ABNORMAL HIGH (ref 0.44–1.00)
GFR calc Af Amer: 11 mL/min — ABNORMAL LOW (ref >60)
GFR, EST NON AFRICAN AMERICAN: 10 mL/min — AB (ref >60)
GLUCOSE: 159 mg/dL — AB (ref 65–99)
POTASSIUM: 4.4 mmol/L (ref 3.5–5.1)
Sodium: 129 mmol/L — ABNORMAL LOW (ref 135–145)

## 2016-12-03 LAB — I-STAT CHEM 8, ED
BUN: 111 mg/dL — ABNORMAL HIGH (ref 6–20)
CALCIUM ION: 1.02 mmol/L — AB (ref 1.15–1.40)
CHLORIDE: 88 mmol/L — AB (ref 101–111)
Creatinine, Ser: 4.4 mg/dL — ABNORMAL HIGH (ref 0.44–1.00)
GLUCOSE: 155 mg/dL — AB (ref 65–99)
HCT: 36 % (ref 36.0–46.0)
HEMOGLOBIN: 12.2 g/dL (ref 12.0–15.0)
POTASSIUM: 4.7 mmol/L (ref 3.5–5.1)
SODIUM: 129 mmol/L — AB (ref 135–145)
TCO2: 29 mmol/L (ref 22–32)

## 2016-12-03 LAB — I-STAT TROPONIN, ED: TROPONIN I, POC: 0.05 ng/mL (ref 0.00–0.08)

## 2016-12-03 LAB — CBC
HEMATOCRIT: 32.3 % — AB (ref 36.0–46.0)
HEMOGLOBIN: 10.4 g/dL — AB (ref 12.0–15.0)
MCH: 24.6 pg — ABNORMAL LOW (ref 26.0–34.0)
MCHC: 32.2 g/dL (ref 30.0–36.0)
MCV: 76.4 fL — ABNORMAL LOW (ref 78.0–100.0)
Platelets: 183 10*3/uL (ref 150–400)
RBC: 4.23 MIL/uL (ref 3.87–5.11)
RDW: 15.4 % (ref 11.5–15.5)
WBC: 4.4 10*3/uL (ref 4.0–10.5)

## 2016-12-03 MED ORDER — TORSEMIDE 20 MG PO TABS: 80.0000 mg | ORAL_TABLET | Freq: Every day | ORAL | Status: DC

## 2016-12-03 MED ORDER — FUROSEMIDE 10 MG/ML IJ SOLN
80.0000 mg | Freq: Once | INTRAMUSCULAR | Status: AC
  Administered 2016-12-03: 80 mg via INTRAVENOUS
  Filled 2016-12-03: qty 8

## 2016-12-03 MED ORDER — DIPHENHYDRAMINE HCL 50 MG/ML IJ SOLN
12.5000 mg | Freq: Once | INTRAMUSCULAR | Status: AC
  Administered 2016-12-03: 12.5 mg via INTRAVENOUS
  Filled 2016-12-03: qty 1

## 2016-12-03 MED ORDER — DIPHENHYDRAMINE HCL 50 MG/ML IJ SOLN: 25.0000 mg | Freq: Once | INTRAMUSCULAR | Status: DC

## 2016-12-03 NOTE — ED Notes (Signed)
Per Dr. Bebe Shaggy order for torsemide is for now not 1000.  He also would like it IV but was not an option in Epic.  Spoke to pharmacy and IV torsemide is on back order.   Lorin Picket, Primary RN notified that order is changed for now.

## 2016-12-03 NOTE — ED Provider Notes (Signed)
[facility]-EMERGENCY DEPT Provider Note   CSN: [id number] Arrival date & time: 12/03/16  0001     History   Chief Complaint Chief Complaint  Patient presents with  . Leg Swelling  . ABD Distention    HPI Jessica [name] is a 56 y.o. female.  The history is provided by the patient.  Shortness of Breath  This is a chronic problem. The problem occurs frequently.The current episode started more than 1 week ago. The problem has been gradually worsening. Associated symptoms include leg swelling. Pertinent negatives include no fever. Associated symptoms comments: Abdominal swelling . She has tried nothing for the symptoms. Associated medical issues include heart failure.  pt with complex medical history including end stage CHF, cardiorenal syndrome, on hospice presents from home for increased dyspnea as well as LE swelling and abdominal distention She has had paracentesis previously She denies active CP/Abd pain No fever/vomiting   Past Medical History:  Diagnosis Date  . Acute respiratory failure (HCC) 09/2016  . AICD (automatic cardioverter/defibrillator) present    a. Biotronik placed 2009. b. ICD discharge 2011, 03/2013.  [name] Alcohol abuse   . Anxiety   . Arterial embolism (HCC)    a. H/o RLE extremity ischemia in 2012 s/p right iliofemoral embolectomy with compartment fasciotomy.  . Asthma   . CAD (coronary artery disease)    a. 1996: anterior wall MI tx with PTCA. b. 2003: PCI/DES to circumflex marginal. c. 2004: PTCA/CBA/stenting of ostium of D1 and LAD. d. 06/2003: s/p PTCA/DES of mRCA. e. 11/2003: CBA of bifurcation diagonal LAD. f. 2007: PTCA/DES to RCA, CBA/stenting to OM2.  . Chronic nausea   . Chronic systolic heart failure (HCC)    a.  secondary to severe ischemic cardiomyopathy with EF 20-25%;  b. s/p rimplantation of Biotronik single chamber ICD by Dr Ladona Ridgel 02/2008;  c.  RHC 07/2013 showed well compensated left sided pressures with moderate PAH and low cardiac output. d. 2D  Echo 03/2014: EF 20-25%, grade 2 DD, PAP .  . CKD (chronic kidney disease), stage II   . COPD (chronic obstructive pulmonary disease) (HCC)   . Diabetes mellitus, type 2 (HCC)   . Dizziness   . DVT (deep venous thrombosis) (HCC)    a. 06/2010.  [name] GERD (gastroesophageal reflux disease)   . History of noncompliance with medical treatment   . HLD (hyperlipidemia)   . LV (left ventricular) mural thrombus    a. Dx on echo 03/2014.  [name] Myocardial infarction (HCC)   . Pulmonary hypertension (HCC)   . Sleep apnea   . Tobacco abuse   . V-tach [facility])     Patient Active Problem List   Diagnosis Date Noted  . CHF (congestive heart failure) (HCC) 10/03/2016  . End-stage systolic heart failure, acute on chronic (HCC) 10/03/2016  . DVT (deep venous thrombosis) (HCC) 10/03/2016  . Pulmonary hypertension (HCC) 10/03/2016  . Acute on chronic respiratory failure with hypoxia (HCC) 10/03/2016  . Diabetes mellitus, type 2 (HCC) 10/03/2016  . CKD (chronic kidney disease) stage 5, GFR less than 15 ml/min (HCC) 10/03/2016  . AICD (automatic cardioverter/defibrillator) present 10/03/2016  . HLD (hyperlipidemia) 10/03/2016  . H/O atrial flutter 10/03/2016  . Chronic nausea 10/03/2016  . Diabetes mellitus with complication (HCC)   . End of life care   . ARF (acute renal failure) (HCC)   . Palliative care by specialist   . Goals of care, counseling/discussion   . Advance care planning   . Cardiogenic shock (HCC) 08/30/2016  .  Typical atrial flutter (HCC)   . Atrial fibrillation with RVR (HCC) 08/22/2016  . CAD (coronary artery disease) 01/15/2015  . Impetigo 01/15/2015  . AKI (acute kidney injury) (HCC) 01/15/2015  . Hyponatremia 01/15/2015  . Hypomagnesemia 01/15/2015  . Diabetes mellitus type 2, uncontrolled (HCC) 01/15/2015  . Acute on chronic systolic (congestive) heart failure (HCC) 01/13/2015  . Chronic systolic CHF (congestive heart failure) (HCC) 05/05/2014  . Ischemic cardiomyopathy  05/05/2014  . COPD (chronic obstructive pulmonary disease) (HCC) 05/05/2014  . LV (left ventricular) mural thrombus (HCC) 03/25/2014  . Rheumatoid factor positive 10/26/2013  . OSA (obstructive sleep apnea) 09/03/2013  . Pulmonary HTN (HCC) 09/03/2013  . PAD (peripheral artery disease) (HCC) 10/06/2012  . Chest pain 07/03/2012  . VT (ventricular tachycardia) (HCC) 12/20/2011  . Depression with anxiety 04/30/2011  . Rash 08/24/2010  . Deep vein thrombosis of lower leg (HCC) 06/28/2010  . HYPOPOTASSEMIA 12/24/2008  . HYPERCHOLESTEROLEMIA 07/15/2008  . NICOTINE ADDICTION 07/15/2008  . DYSPNEA 03/21/2008  . NONSPECIFIC LOW BP 03/21/2008  . ICD (implantable cardioverter-defibrillator) in place 03/21/2008    Past Surgical History:  Procedure Laterality Date  . CARDIAC CATHETERIZATION    . CARDIOVERSION N/A 08/27/2016   Procedure: CARDIOVERSION;  Surgeon: Dolores Patty, MD;  Location: [facility] ENDOSCOPY;  Service: Cardiovascular;  Laterality: N/A;  . DIAGNOSTIC LAPAROSCOPY    . ICD GENERATOR CHANGEOUT N/A 07/20/2016   Procedure: ICD Generator Changeout;  Surgeon: Marinus Maw, MD;  Location: [facility] INVASIVE CV LAB;  Service: Cardiovascular;  Laterality: N/A;  . INSERT / REPLACE / REMOVE PACEMAKER    . IR FLUORO GUIDE CV LINE RIGHT  09/05/2016  . IR PARACENTESIS  11/16/2016  . IR US GUIDE VASC ACCESS RIGHT  09/05/2016  . RIGHT HEART CATHETERIZATION N/A 07/23/2013   Procedure: RIGHT HEART CATH;  Surgeon: Dolores Patty, MD;  Location: [facility] CATH LAB;  Service: Cardiovascular;  Laterality: N/A;  . TEE WITHOUT CARDIOVERSION N/A 08/27/2016   Procedure: TRANSESOPHAGEAL ECHOCARDIOGRAM (TEE);  Surgeon: Dolores Patty, MD;  Location: [facility] ENDOSCOPY;  Service: Cardiovascular;  Laterality: N/A;  . TUBAL LIGATION      OB History    No data available       Home Medications    Prior to Admission medications   Medication Sig Start Date End Date Taking? Authorizing Provider  acetaminophen  (TYLENOL) 500 MG tablet Take 1,000 mg by mouth every 6 (six) hours as needed (pain).    Yes [provider]  albuterol (PROVENTIL) (2.5 MG/3ML) 0.083% nebulizer solution Take 2.5 mg by nebulization every 6 (six) hours as needed for wheezing or shortness of breath.   Yes [provider]  hydrOXYzine (ATARAX/VISTARIL) 25 MG tablet Take 1 tablet (25 mg total) by mouth every 4 (four) hours as needed for itching. 11/09/16  Yes Graciella Freer, PA-C  LORazepam (ATIVAN) 0.5 MG tablet Place 0.5 tablets (0.25 mg total) under the tongue every 6 (six) hours as needed for anxiety (tremors). 10/01/16  Yes Graciella Freer, PA-C  metolazone (ZAROXOLYN) 5 MG tablet Take 5 mg by mouth 3 (three) times a week. Monday, Wednesday and Sunday   Yes [provider]  senna (SENOKOT) 8.6 MG TABS tablet Take 2 tablets (17.2 mg total) by mouth 2 (two) times daily. 10/01/16  Yes Graciella Freer, PA-C  torsemide (DEMADEX) 20 MG tablet Take 4 tablets (80 mg total) by mouth 2 (two) times daily. 10/19/16  Yes Graciella Freer, PA-C  camphor-menthol [facility]) lotion Apply 1  application topically as needed for itching. Patient not taking: Reported on 12/03/2016 10/19/16   Graciella Freer, PA-C  feeding supplement (BOOST / RESOURCE BREEZE) LIQD Take 1 Container by mouth 3 (three) times daily between meals. Patient not taking: Reported on 12/03/2016 10/01/16   Graciella Freer, PA-C  HYDROmorphone (DILAUDID) 1 MG/ML injection Inject 0.25 mLs (0.25 mg total) into the vein every 2 (two) hours as needed (DYSPNEA or DISTRESS). Patient not taking: Reported on 12/03/2016 10/06/16   Osvaldo Shipper, MD  HYDROmorphone (DILAUDID) 2 MG tablet Take 0.5 tablets (1 mg total) by mouth 3 (three) times daily. Patient not taking: Reported on 12/03/2016 10/06/16   Osvaldo Shipper, MD  LORazepam (ATIVAN) 2 MG/ML injection Inject 0.5 mLs (1 mg total) into the vein every 4 (four) hours as needed for  anxiety (or air hunger). Patient not taking: Reported on 12/03/2016 10/06/16   Osvaldo Shipper, MD    Family History Family History  Problem Relation Age of Onset  . Coronary artery disease Unknown        FMILY HISTORY    Social History Social History  Substance Use Topics  . Smoking status: Former Smoker    Packs/day: 0.25    Years: 21.00    Types: Cigars    Quit date: 08/08/2016  . Smokeless tobacco: Never Used     Comment: Smokes 2-3 cigars daily, quit smoking cigarettes 3 yrs ago.   . Alcohol use No     Allergies   Nyquil multi-symptom [pseudoeph-doxylamine-dm-apap]; Sudafed [pseudoephedrine]; Lasix [furosemide]; Torsemide; Zocor [simvastatin]; Latex; and Ramipril   Review of Systems Review of Systems  Constitutional: Negative for fever.  Respiratory: Positive for shortness of breath.   Cardiovascular: Positive for leg swelling.  Gastrointestinal: Positive for abdominal distention.  All other systems reviewed and are negative.    Physical Exam Updated Vital Signs BP 102/75 (BP Location: Right Arm)   Pulse 65   Temp (!) 97.5 F (36.4 C)   Resp (!) 23   Ht 1.575 m ( )   Wt 56.7 kg (125 lb)   SpO2 100%   BMI 22.86 kg/m   Physical Exam CONSTITUTIONAL: chronically ill appearing, appears older than stated age HEAD: Normocephalic/atraumatic EYES: EOMI/PERRL icterus noted ENMT: Mucous membranes moist NECK: supple no meningeal signs SPINE/BACK:entire spine nontender CV: S1/S2 noted, murmur noted LUNGS: crackles bilaterally, no distress noted ABDOMEN: soft, nontender, mild distention noted NEURO: Pt is awake/alert/appropriate, moves all extremitiesx4.  No facial droop.   EXTREMITIES: pulses normal/equal, full ROM, LE edema noted to bilateral LE SKIN: multiple healing scabs/wounds to back/extremities PSYCH: no abnormalities of mood noted, alert and oriented to situation   ED Treatments / Results  Labs (all labs ordered are listed, but only abnormal  results are displayed) Labs Reviewed  BASIC METABOLIC PANEL - Abnormal; Notable for the following:       Result Value   Sodium 129 (*)    Chloride 89 (*)    Glucose, Bld 159 (*)    BUN 113 (*)    Creatinine, Ser 4.56 (*)    Calcium 8.8 (*)    GFR calc non Af Amer 10 (*)    GFR calc Af Amer 11 (*)    All other components within normal limits  CBC - Abnormal; Notable for the following:    Hemoglobin 10.4 (*)    HCT 32.3 (*)    MCV 76.4 (*)    MCH 24.6 (*)    All other components within normal limits  I-STAT CHEM 8, ED - Abnormal; Notable for the following:    Sodium 129 (*)    Chloride 88 (*)    BUN 111 (*)    Creatinine, Ser 4.40 (*)    Glucose, Bld 155 (*)    Calcium, Ion 1.02 (*)    All other components within normal limits  I-STAT TROPONIN, ED    EKG  EKG Interpretation  Date/Time:  Monday December 03 2016 00:24:45 EDT Ventricular Rate:  63 PR Interval:    QRS Duration: 126 QT Interval:  470 QTC Calculation: 482 R Axis:   -60 Text Interpretation:  Sinus rhythm Multiple premature complexes, vent & supraven Left atrial enlargement Nonspecific IVCD with LAD Nonspecific T abnormalities, lateral leads Confirmed by Zadie Rhine ([id number]) on 12/03/2016 12:47:53 AM       Radiology Dg Chest Port 1 View  Result Date: 12/03/2016 CLINICAL DATA:  Shortness of breath. EXAM: PORTABLE CHEST 1 VIEW COMPARISON:  Radiographs 10/03/2016 FINDINGS: Left-sided pacemaker in place. Cardiomegaly and mediastinal contours are unchanged. Previous right upper extremity PICC been removed. Worsening peribronchial cuffing suspicious for pulmonary edema. Suspect small right pleural effusion. Skin folds project over the upper chest. No pneumothorax. No confluent airspace disease. IMPRESSION: Progressive peribronchial cuffing suspicious for pulmonary edema. Unchanged cardiomegaly. Electronically Signed   By: Rubye Oaks M.D.   On: 12/03/2016 00:43    Procedures Procedures (including critical  care time)  Medications Ordered in ED Medications  torsemide (DEMADEX) tablet 80 mg (not administered)     Initial Impression / Assessment and Plan / ED Course  I have reviewed the triage vital signs and the nursing notes.  Pertinent labs & imaging results that were available during my care of the patient were reviewed by me and considered in my medical decision making (see chart for details).     2:35 AM Pt chronically ill, on hospice I spoke to hospice nurse, pt is DNR She requested to come to ED for evaluation At this point, I don't think there is much benefit from paracentesis However, we agree to try diuresis to help with pulm, edema Allergy lasix, demadex ordered for patient She is awake/alert at this time  5:44 AM There was a delay in torsemide, and when I spoke to patient she reports she can use lasix if given with benadryl This was given to patient and no complications noted She only makes urine about 3 times/day She has rested through most of night We discussed at length the chronic nature of this illness I don't feel she requires paracentesis  Also does not require large amounts of oxygen I offered admission, but she prefers to go home She does admit to missing recent doses of torsemide at home Advised med compliance Also advised to call her hospice nurse when she gets home D/w son who is agreeable with plan  Final Clinical Impressions(s) / ED Diagnoses   Final diagnoses:  Peripheral edema  End stage congestive heart failure [facility])    New Prescriptions New Prescriptions   No medications on file     Zadie Rhine, MD 12/03/16 [phone]

## 2016-12-03 NOTE — ED Triage Notes (Signed)
Pt brought in by EMS for left lower extremity swelling and ABD distention. EMS reports the pt has a hx of CHF and is under Hospice Care for same. EMS reports the pt is on chronic oxygen at 3 liters.  Pt reports this has been going on for several days now. Pt reports the only way to relieve the pain to to get lasix and put a drain in her abd.

## 2016-12-10 DEATH — deceased

## 2016-12-11 ENCOUNTER — Encounter (HOSPITAL_COMMUNITY): Payer: Self-pay | Admitting: *Deleted

## 2016-12-11 NOTE — Progress Notes (Signed)
DC completed and signed by Dr Gala Romney, left at front desk for [facility] to p/u

## 2018-04-11 IMAGING — XA IR US GUIDE VASC ACCESS RIGHT
1 series · 2 of 2 positions shown · non-contrast
Comparison: none

INDICATION: Renal failure

[Series 1: fl (-) angio · 2 of 2 slices shown]
[im 1/2]
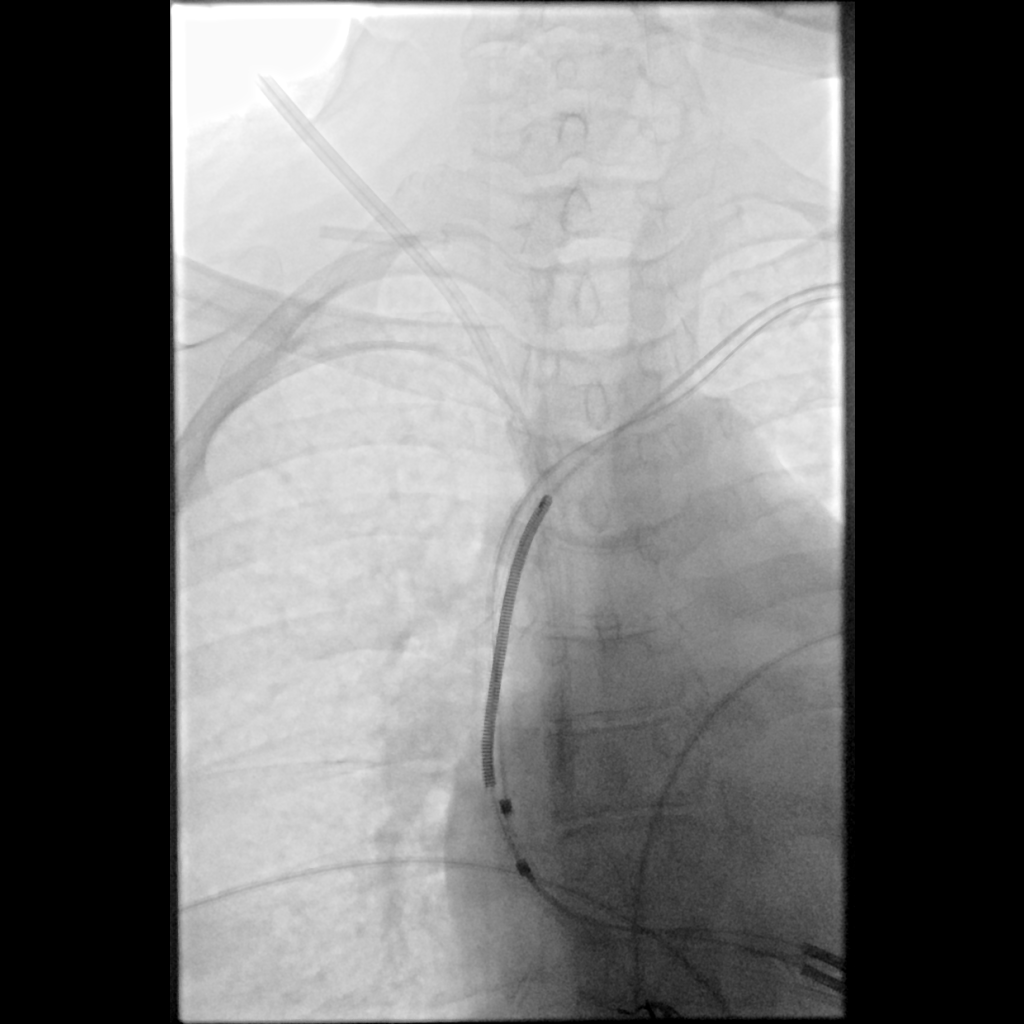
[im 2/2]
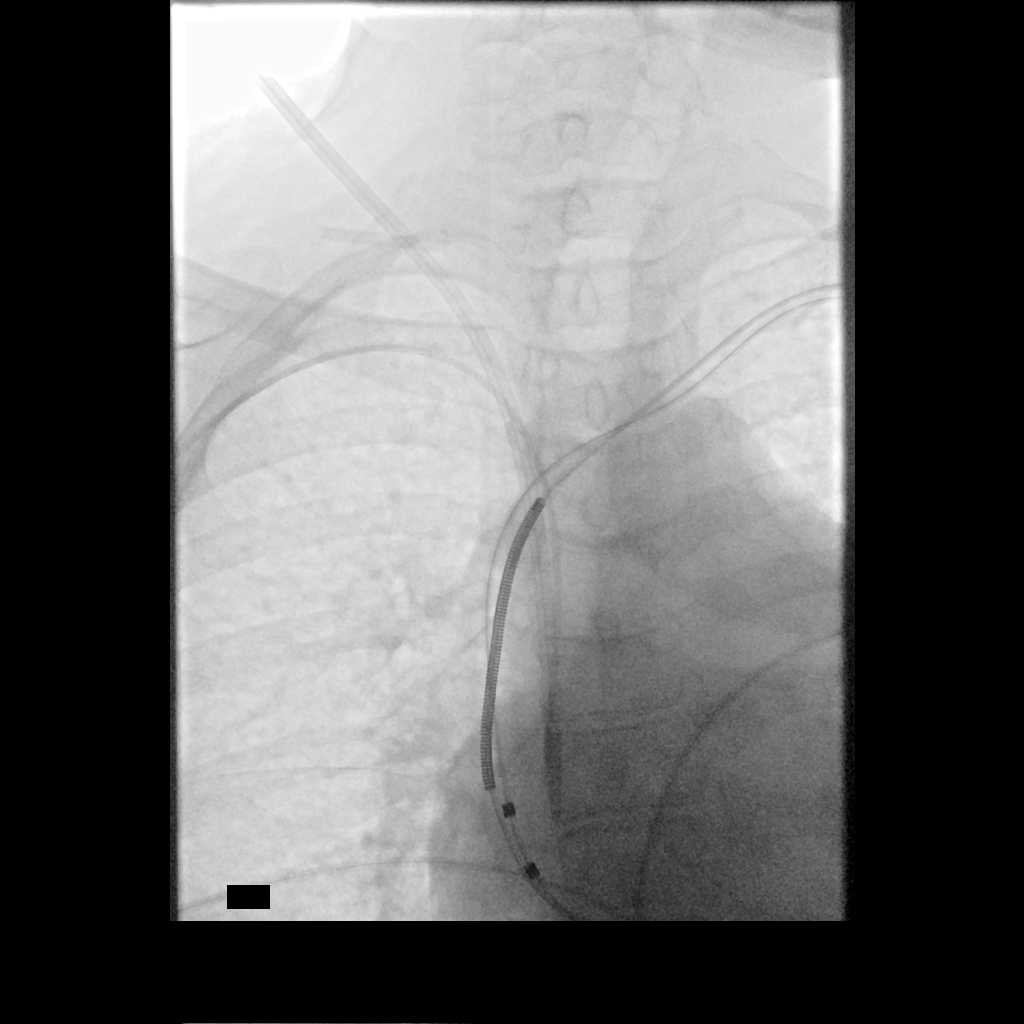

[2 of 2 positions shown; findings below may reference images not displayed]

EXAM:
TEMPORARY DIALYSIS CATHETER PLACEMENT

MEDICATIONS:
None

ANESTHESIA/SEDATION:
None

FLUOROSCOPY TIME:  Fluoroscopy Time:  minutes 24 seconds (1 mGy).

COMPLICATIONS:
None immediate.

PROCEDURE:
Informed written consent was obtained from the patient after a
thorough discussion of the procedural risks, benefits and
alternatives. All questions were addressed. Maximal Sterile Barrier
Technique was utilized including caps, mask, sterile gowns, sterile
gloves, sterile drape, hand hygiene and skin antiseptic. A timeout
was performed prior to the initiation of the procedure.

The right neck was prepped and draped in a sterile fashion. Under
sonographic guidance, a micropuncture needle was inserted into the
right jugular vein and removed over a 018 wire which was up sized to
an Amplatz. The temporary dialysis catheter was then advanced over
the wire. It was flushed and sewn in place.
FINDINGS: Image demonstrates a temporary dialysis catheter with its tip at the
cavoatrial junction.
IMPRESSION: Successful right jugular temporary dialysis catheter placement.

## 2018-04-16 IMAGING — DX DG CHEST 1V PORT
1 series · 1 of 1 positions shown · non-contrast
Comparison: 08/24/2016

CLINICAL DATA: Fever and cough

EXAM:
PORTABLE CHEST 1 VIEW

[chest ap]
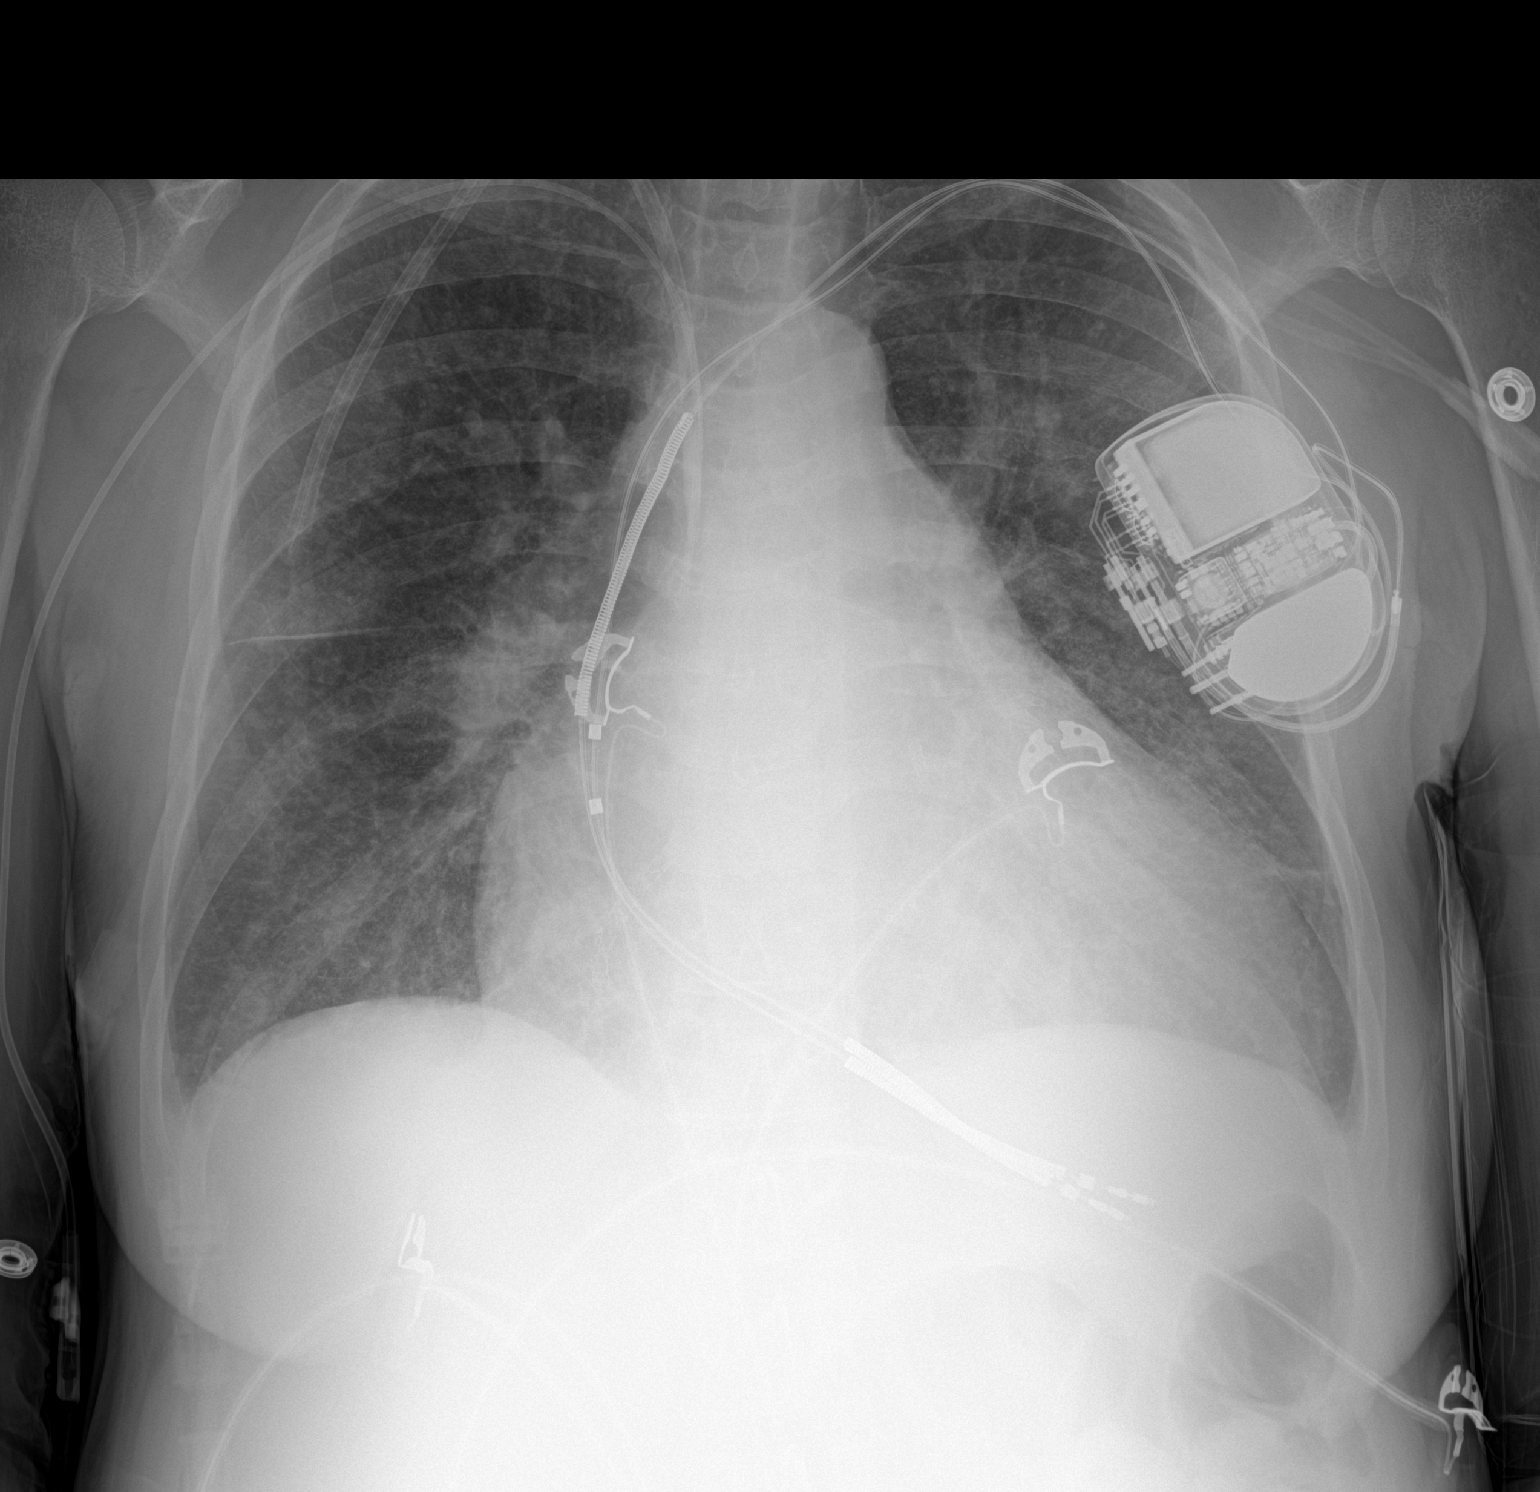

[1 of 1 positions shown; findings below may reference images not displayed]

FINDINGS: Cardiac shadow is enlarged. Defibrillator is again seen and stable.
Right-sided PICC line is noted in satisfactory position. New right
dialysis catheter is noted as well. The lungs are well aerated
bilaterally. Mild vascular congestion remains and stable from the
prior exam. No focal confluent infiltrate is seen.
IMPRESSION: Persistent mild vascular congestion.

## 2018-06-22 IMAGING — US IR PARACENTESIS
1 series · 4 of 4 positions shown · non-contrast
Comparison: none

INDICATION: History of end-stage heart failure with an ejection fraction of 10%
with abdominal ascites. Request is made for therapeutic
paracentesis.

[Series 1: ir (id) (id)/(id)/(id) ir · 4 of 4 slices shown]
[im 1/4]
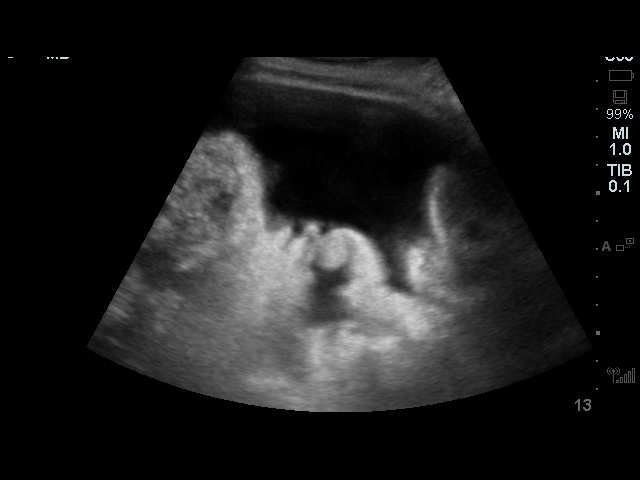
[im 2/4]
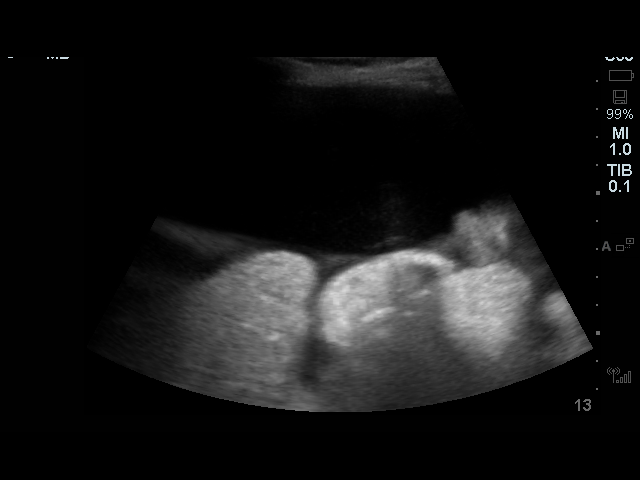
[im 3/4]
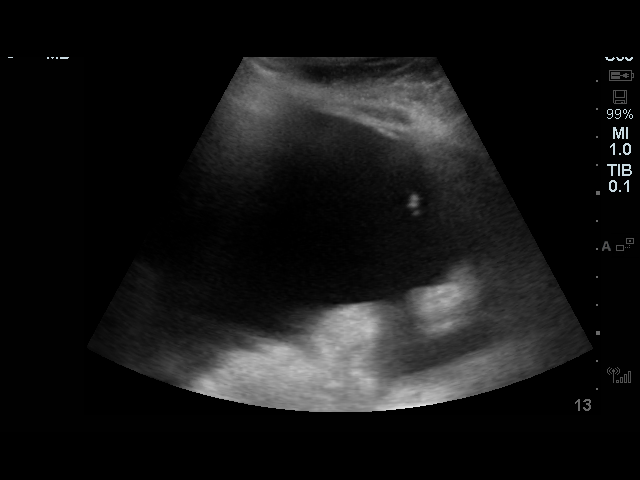
[im 4/4]
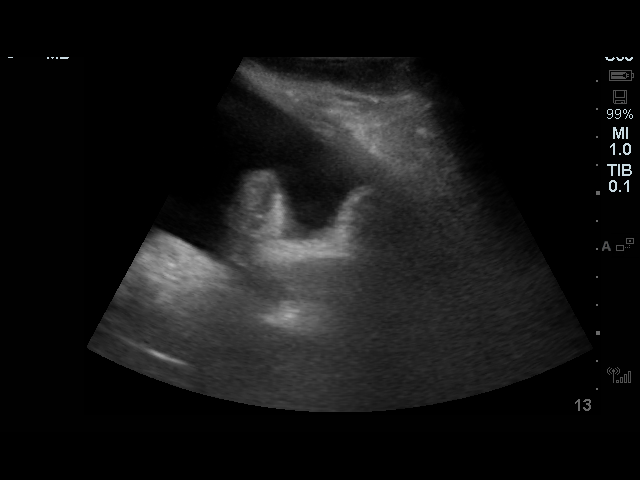

[4 of 4 positions shown; findings below may reference images not displayed]

EXAM:
ULTRASOUND GUIDED THERAPEUTIC PARACENTESIS

MEDICATIONS:
1% xylocaine

COMPLICATIONS:
None immediate.

PROCEDURE:
Informed written consent was obtained from the patient after a
discussion of the risks, benefits and alternatives to treatment. A
timeout was performed prior to the initiation of the procedure.

Initial ultrasound scanning demonstrates a small amount of ascites
within the right lower abdominal quadrant. The right lower abdomen
was prepped and draped in the usual sterile fashion. 1% xylocaine
was used for local anesthesia.

Following this, a 19 gauge, 7-cm, Yueh catheter was introduced. An
ultrasound image was saved for documentation purposes. The
paracentesis was performed. The catheter was removed and a dressing
was applied. The patient tolerated the procedure well without
immediate post procedural complication.
FINDINGS: A total of approximately 1.7 L of amber fluid was removed.
IMPRESSION: Successful ultrasound-guided paracentesis yielding 1.7 liters of
peritoneal fluid. The procedure was terminated early secondary to
hypotension. The patient was monitored in the nurse's station after
the procedure. Her blood pressure did come back up close to her
baseline prior to discharge. She denied dizziness or otherwise not
feeling well. She actually states she feels better since the
procedure.
